# Patient Record
Sex: Female | Born: 1950
Health system: Southern US, Community
[De-identification: ages and names within clinical notes are randomized; demographics above are authoritative.]

## PROBLEM LIST (undated history)

## (undated) DIAGNOSIS — M25579 Pain in unspecified ankle and joints of unspecified foot: Secondary | ICD-10-CM

## (undated) DIAGNOSIS — F419 Anxiety disorder, unspecified: Secondary | ICD-10-CM

## (undated) DIAGNOSIS — M1990 Unspecified osteoarthritis, unspecified site: Secondary | ICD-10-CM

## (undated) DIAGNOSIS — M545 Low back pain, unspecified: Secondary | ICD-10-CM

## (undated) DIAGNOSIS — M069 Rheumatoid arthritis, unspecified: Secondary | ICD-10-CM

## (undated) DIAGNOSIS — K219 Gastro-esophageal reflux disease without esophagitis: Secondary | ICD-10-CM

## (undated) DIAGNOSIS — J45909 Unspecified asthma, uncomplicated: Secondary | ICD-10-CM

## (undated) DIAGNOSIS — E669 Obesity, unspecified: Secondary | ICD-10-CM

## (undated) DIAGNOSIS — G629 Polyneuropathy, unspecified: Secondary | ICD-10-CM

## (undated) DIAGNOSIS — M549 Dorsalgia, unspecified: Secondary | ICD-10-CM

## (undated) DIAGNOSIS — R609 Edema, unspecified: Secondary | ICD-10-CM

## (undated) DIAGNOSIS — H269 Unspecified cataract: Secondary | ICD-10-CM

## (undated) DIAGNOSIS — K5730 Diverticulosis of large intestine without perforation or abscess without bleeding: Secondary | ICD-10-CM

## (undated) DIAGNOSIS — K5792 Diverticulitis of intestine, part unspecified, without perforation or abscess without bleeding: Secondary | ICD-10-CM

## (undated) DIAGNOSIS — M2550 Pain in unspecified joint: Secondary | ICD-10-CM

## (undated) DIAGNOSIS — D869 Sarcoidosis, unspecified: Secondary | ICD-10-CM

## (undated) DIAGNOSIS — H409 Unspecified glaucoma: Secondary | ICD-10-CM

## (undated) DIAGNOSIS — R0789 Other chest pain: Secondary | ICD-10-CM

## (undated) DIAGNOSIS — M76829 Posterior tibial tendinitis, unspecified leg: Secondary | ICD-10-CM

## (undated) DIAGNOSIS — T7840XA Allergy, unspecified, initial encounter: Secondary | ICD-10-CM

## (undated) DIAGNOSIS — D649 Anemia, unspecified: Secondary | ICD-10-CM

## (undated) DIAGNOSIS — J209 Acute bronchitis, unspecified: Secondary | ICD-10-CM

## (undated) DIAGNOSIS — F329 Major depressive disorder, single episode, unspecified: Secondary | ICD-10-CM

## (undated) DIAGNOSIS — E739 Lactose intolerance, unspecified: Secondary | ICD-10-CM

## (undated) DIAGNOSIS — F32A Depression, unspecified: Secondary | ICD-10-CM

## (undated) DIAGNOSIS — R0602 Shortness of breath: Secondary | ICD-10-CM

## (undated) HISTORY — DX: Low back pain, unspecified: M54.50

## (undated) HISTORY — DX: Anxiety disorder, unspecified: F41.9

## (undated) HISTORY — DX: Sarcoidosis, unspecified: D86.9

## (undated) HISTORY — PX: EYE SURGERY: SHX253

## (undated) HISTORY — DX: Allergy, unspecified, initial encounter: T78.40XA

## (undated) HISTORY — DX: Edema, unspecified: R60.9

## (undated) HISTORY — DX: Anemia, unspecified: D64.9

## (undated) HISTORY — DX: Low back pain: M54.5

## (undated) HISTORY — DX: Other chest pain: R07.89

## (undated) HISTORY — DX: Polyneuropathy, unspecified: G62.9

## (undated) HISTORY — DX: Shortness of breath: R06.02

## (undated) HISTORY — DX: Lactose intolerance, unspecified: E73.9

## (undated) HISTORY — PX: KNEE ARTHROSCOPY: SUR90

## (undated) HISTORY — DX: Diverticulitis of intestine, part unspecified, without perforation or abscess without bleeding: K57.92

## (undated) HISTORY — DX: Posterior tibial tendinitis, unspecified leg: M76.829

## (undated) HISTORY — DX: Dorsalgia, unspecified: M54.9

## (undated) HISTORY — DX: Unspecified asthma, uncomplicated: J45.909

## (undated) HISTORY — DX: Unspecified osteoarthritis, unspecified site: M19.90

## (undated) HISTORY — DX: Acute bronchitis, unspecified: J20.9

## (undated) HISTORY — DX: Unspecified cataract: H26.9

## (undated) HISTORY — DX: Rheumatoid arthritis, unspecified: M06.9

## (undated) HISTORY — DX: Obesity, unspecified: E66.9

## (undated) HISTORY — DX: Diverticulosis of large intestine without perforation or abscess without bleeding: K57.30

## (undated) HISTORY — DX: Pain in unspecified joint: M25.50

## (undated) HISTORY — PX: COLONOSCOPY: SHX174

## (undated) HISTORY — DX: Pain in unspecified ankle and joints of unspecified foot: M25.579

## (undated) HISTORY — DX: Depression, unspecified: F32.A

## (undated) HISTORY — PX: ABDOMINAL HYSTERECTOMY: SHX81

## (undated) HISTORY — DX: Major depressive disorder, single episode, unspecified: F32.9

## (undated) HISTORY — PX: HAND SURGERY: SHX662

---

## 1998-11-26 ENCOUNTER — Encounter: Payer: Self-pay | Admitting: Obstetrics and Gynecology

## 1998-11-26 ENCOUNTER — Ambulatory Visit (HOSPITAL_COMMUNITY): Admission: RE | Admit: 1998-11-26 | Discharge: 1998-11-26 | Payer: Self-pay | Admitting: Obstetrics and Gynecology

## 1998-12-03 ENCOUNTER — Ambulatory Visit (HOSPITAL_COMMUNITY): Admission: RE | Admit: 1998-12-03 | Discharge: 1998-12-03 | Payer: Self-pay | Admitting: Obstetrics and Gynecology

## 1998-12-03 ENCOUNTER — Encounter: Payer: Self-pay | Admitting: Obstetrics and Gynecology

## 2000-02-28 ENCOUNTER — Encounter: Payer: Self-pay | Admitting: Obstetrics and Gynecology

## 2000-02-28 ENCOUNTER — Ambulatory Visit (HOSPITAL_COMMUNITY): Admission: RE | Admit: 2000-02-28 | Discharge: 2000-02-28 | Payer: Self-pay | Admitting: Obstetrics and Gynecology

## 2000-03-01 ENCOUNTER — Other Ambulatory Visit: Admission: RE | Admit: 2000-03-01 | Discharge: 2000-03-01 | Payer: Self-pay | Admitting: Obstetrics and Gynecology

## 2000-12-22 ENCOUNTER — Encounter: Payer: Self-pay | Admitting: Orthopedic Surgery

## 2000-12-22 ENCOUNTER — Ambulatory Visit (HOSPITAL_COMMUNITY): Admission: RE | Admit: 2000-12-22 | Discharge: 2000-12-22 | Payer: Self-pay | Admitting: Orthopedic Surgery

## 2001-03-18 ENCOUNTER — Other Ambulatory Visit: Admission: RE | Admit: 2001-03-18 | Discharge: 2001-03-18 | Payer: Self-pay | Admitting: Obstetrics and Gynecology

## 2001-03-19 ENCOUNTER — Encounter: Payer: Self-pay | Admitting: Obstetrics and Gynecology

## 2001-03-19 ENCOUNTER — Ambulatory Visit (HOSPITAL_COMMUNITY): Admission: RE | Admit: 2001-03-19 | Discharge: 2001-03-19 | Payer: Self-pay | Admitting: Obstetrics and Gynecology

## 2001-07-10 ENCOUNTER — Encounter: Admission: RE | Admit: 2001-07-10 | Discharge: 2001-07-10 | Payer: Self-pay | Admitting: Internal Medicine

## 2001-07-10 ENCOUNTER — Encounter: Payer: Self-pay | Admitting: Internal Medicine

## 2002-07-16 ENCOUNTER — Ambulatory Visit (HOSPITAL_COMMUNITY): Admission: RE | Admit: 2002-07-16 | Discharge: 2002-07-16 | Payer: Self-pay | Admitting: Obstetrics and Gynecology

## 2002-07-16 ENCOUNTER — Encounter: Payer: Self-pay | Admitting: Obstetrics and Gynecology

## 2004-08-17 ENCOUNTER — Ambulatory Visit: Payer: Self-pay | Admitting: Internal Medicine

## 2005-01-19 ENCOUNTER — Ambulatory Visit: Payer: Self-pay | Admitting: Internal Medicine

## 2005-01-26 ENCOUNTER — Ambulatory Visit: Payer: Self-pay | Admitting: Internal Medicine

## 2005-03-15 ENCOUNTER — Ambulatory Visit (HOSPITAL_COMMUNITY): Admission: RE | Admit: 2005-03-15 | Discharge: 2005-03-15 | Payer: Self-pay | Admitting: Obstetrics and Gynecology

## 2005-06-22 ENCOUNTER — Ambulatory Visit: Payer: Self-pay | Admitting: Internal Medicine

## 2005-12-15 ENCOUNTER — Other Ambulatory Visit: Admission: RE | Admit: 2005-12-15 | Discharge: 2005-12-15 | Payer: Self-pay | Admitting: Obstetrics and Gynecology

## 2006-01-22 ENCOUNTER — Ambulatory Visit: Payer: Self-pay | Admitting: Internal Medicine

## 2006-04-12 ENCOUNTER — Ambulatory Visit: Payer: Self-pay | Admitting: Internal Medicine

## 2006-06-06 ENCOUNTER — Ambulatory Visit: Payer: Self-pay | Admitting: Internal Medicine

## 2006-08-22 ENCOUNTER — Ambulatory Visit: Payer: Self-pay | Admitting: Internal Medicine

## 2006-09-06 ENCOUNTER — Ambulatory Visit: Payer: Self-pay | Admitting: Internal Medicine

## 2007-07-11 ENCOUNTER — Ambulatory Visit: Payer: Self-pay | Admitting: Internal Medicine

## 2007-07-11 DIAGNOSIS — M549 Dorsalgia, unspecified: Secondary | ICD-10-CM | POA: Insufficient documentation

## 2007-07-11 DIAGNOSIS — K5730 Diverticulosis of large intestine without perforation or abscess without bleeding: Secondary | ICD-10-CM | POA: Insufficient documentation

## 2007-07-12 ENCOUNTER — Ambulatory Visit (HOSPITAL_COMMUNITY): Admission: RE | Admit: 2007-07-12 | Discharge: 2007-07-12 | Payer: Self-pay | Admitting: Obstetrics and Gynecology

## 2007-07-24 ENCOUNTER — Ambulatory Visit: Payer: Self-pay | Admitting: Gastroenterology

## 2007-08-07 ENCOUNTER — Encounter: Payer: Self-pay | Admitting: Internal Medicine

## 2007-08-07 ENCOUNTER — Ambulatory Visit: Payer: Self-pay | Admitting: Gastroenterology

## 2007-08-13 ENCOUNTER — Encounter: Payer: Self-pay | Admitting: Internal Medicine

## 2007-08-22 ENCOUNTER — Ambulatory Visit: Payer: Self-pay | Admitting: Internal Medicine

## 2007-08-22 LAB — CONVERTED CEMR LAB
AST: 22 units/L (ref 0–37)
Alkaline Phosphatase: 56 units/L (ref 39–117)
BUN: 10 mg/dL (ref 6–23)
CO2: 30 meq/L (ref 19–32)
Eosinophils Absolute: 0.2 10*3/uL (ref 0.0–0.6)
Eosinophils Relative: 3.8 % (ref 0.0–5.0)
Glucose, Bld: 106 mg/dL — ABNORMAL HIGH (ref 70–99)
HCT: 36.5 % (ref 36.0–46.0)
Hemoglobin: 12.5 g/dL (ref 12.0–15.0)
MCHC: 34.2 g/dL (ref 30.0–36.0)
Monocytes Relative: 8.8 % (ref 3.0–11.0)
Neutro Abs: 2.5 10*3/uL (ref 1.4–7.7)
Neutrophils Relative %: 41.5 % — ABNORMAL LOW (ref 43.0–77.0)
Platelets: 304 10*3/uL (ref 150–400)
RDW: 13.3 % (ref 11.5–14.6)
Sodium: 143 meq/L (ref 135–145)
TSH: 0.84 microintl units/mL (ref 0.35–5.50)
Total Protein: 6.4 g/dL (ref 6.0–8.3)

## 2007-09-02 ENCOUNTER — Ambulatory Visit: Payer: Self-pay | Admitting: Internal Medicine

## 2007-09-02 DIAGNOSIS — D869 Sarcoidosis, unspecified: Secondary | ICD-10-CM | POA: Insufficient documentation

## 2007-09-02 DIAGNOSIS — J45909 Unspecified asthma, uncomplicated: Secondary | ICD-10-CM | POA: Insufficient documentation

## 2007-09-23 ENCOUNTER — Ambulatory Visit: Payer: Self-pay | Admitting: Internal Medicine

## 2007-09-23 DIAGNOSIS — J209 Acute bronchitis, unspecified: Secondary | ICD-10-CM | POA: Insufficient documentation

## 2007-10-11 ENCOUNTER — Telehealth: Payer: Self-pay | Admitting: Internal Medicine

## 2007-10-11 ENCOUNTER — Ambulatory Visit: Payer: Self-pay | Admitting: Internal Medicine

## 2008-03-26 ENCOUNTER — Telehealth: Payer: Self-pay | Admitting: Internal Medicine

## 2008-04-01 ENCOUNTER — Ambulatory Visit: Payer: Self-pay | Admitting: Internal Medicine

## 2008-04-01 DIAGNOSIS — M25579 Pain in unspecified ankle and joints of unspecified foot: Secondary | ICD-10-CM | POA: Insufficient documentation

## 2008-04-01 DIAGNOSIS — M545 Low back pain, unspecified: Secondary | ICD-10-CM | POA: Insufficient documentation

## 2008-04-13 ENCOUNTER — Ambulatory Visit: Payer: Self-pay | Admitting: Internal Medicine

## 2008-07-22 ENCOUNTER — Ambulatory Visit: Payer: Self-pay | Admitting: Internal Medicine

## 2008-07-22 DIAGNOSIS — R609 Edema, unspecified: Secondary | ICD-10-CM | POA: Insufficient documentation

## 2008-10-21 ENCOUNTER — Ambulatory Visit: Payer: Self-pay | Admitting: Internal Medicine

## 2008-10-21 DIAGNOSIS — R0789 Other chest pain: Secondary | ICD-10-CM | POA: Insufficient documentation

## 2009-03-08 ENCOUNTER — Telehealth: Payer: Self-pay | Admitting: Internal Medicine

## 2009-07-14 ENCOUNTER — Ambulatory Visit: Payer: Self-pay | Admitting: Internal Medicine

## 2009-08-17 ENCOUNTER — Ambulatory Visit: Payer: Self-pay | Admitting: Internal Medicine

## 2009-08-25 ENCOUNTER — Ambulatory Visit: Payer: Self-pay | Admitting: Family Medicine

## 2009-08-25 DIAGNOSIS — M76829 Posterior tibial tendinitis, unspecified leg: Secondary | ICD-10-CM | POA: Insufficient documentation

## 2009-11-17 ENCOUNTER — Ambulatory Visit: Payer: Self-pay | Admitting: Internal Medicine

## 2009-11-18 ENCOUNTER — Encounter: Payer: Self-pay | Admitting: Internal Medicine

## 2009-11-19 ENCOUNTER — Encounter: Payer: Self-pay | Admitting: Internal Medicine

## 2010-01-11 ENCOUNTER — Encounter: Payer: Self-pay | Admitting: Internal Medicine

## 2010-06-29 ENCOUNTER — Ambulatory Visit: Payer: Self-pay | Admitting: Internal Medicine

## 2010-11-01 NOTE — Medication Information (Signed)
Summary: Coverage Approval for Fexofenadine  Coverage Approval for Fexofenadine   Imported By: Maryln Gottron 01/12/2010 13:19:09  _____________________________________________________________________  External Attachment:    Type:   Image     Comment:   External Document

## 2010-11-01 NOTE — Miscellaneous (Signed)
Summary: omeprazole added  Medications Added OMEPRAZOLE 20 MG CPDR (OMEPRAZOLE) qd       Clinical Lists Changes  Medications: Added new medication of OMEPRAZOLE 20 MG CPDR (OMEPRAZOLE) qd - Signed Rx of OMEPRAZOLE 20 MG CPDR (OMEPRAZOLE) qd;  #90 x 6;  Signed;  Entered by: Duard Brady LPN;  Authorized by: Gordy Savers  MD;  Method used: Electronically to [facility] [address], [place], [place]  [address], Ph: [phone], Fax: [phone]    Prescriptions: OMEPRAZOLE 20 MG CPDR (OMEPRAZOLE) qd  #90 x 6   Entered by:   Duard Brady LPN   Authorized by:   Gordy Savers  MD   Signed by:   Duard Brady LPN on 84/13/2440   Method used:   Electronically to        [facility]* (retail)       [address]       [place], [place]  [address]       Ph: [phone]       Fax: [phone]   RxID:   [id number]

## 2010-11-01 NOTE — Medication Information (Signed)
Summary: Prior Authorization Request for Aciphex  Prior Authorization Request for Aciphex   Imported By: Maryln Gottron 11/24/2009 15:46:00  _____________________________________________________________________  External Attachment:    Type:   Image     Comment:   External Document

## 2010-11-01 NOTE — Assessment & Plan Note (Signed)
Summary: FLU SHOT/NJR   Nurse Visit  CC: Flu shot./kb   Allergies: 1)  ! Motrin Ib (Ibuprofen)  Orders Added: 1)  Admin 1st Vaccine [90471] 2)  Flu Vaccine 57yrs + [[address]]              Flu Vaccine Consent Questions     Do you have a history of severe allergic reactions to this vaccine? no    Any prior history of allergic reactions to egg and/or gelatin? no    Do you have a sensitivity to the preservative Thimersol? no    Do you have a past history of Guillan-Barre Syndrome? no    Do you currently have an acute febrile illness? no    Have you ever had a severe reaction to latex? no    Vaccine information given and explained to patient? yes    Are you currently pregnant? no    Lot Number:[id number]   Exp Date:04/01/2011   Site Given  Left Deltoid IMu

## 2010-11-01 NOTE — Assessment & Plan Note (Signed)
Summary: fu on ankle/njr   Vital Signs:  Patient profile:   60 year old female Weight:      228 pounds Temp:     98.6 degrees F BP sitting:   120 / 74  (right arm) Cuff size:   large  Vitals Entered By: Duard Brady LPN (November 17, 2009 8:59 AM) CC: c/o left ankle pain still , from dec.    CC:  c/o left ankle pain still  and from dec. Marland Kitchen  History of Present Illness: 60 year old patient who took a misstep over two months ago experiencing pain involving the left medial ankle.  This has not improved with conservative treatment.  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  She has a history of asthma, which has been stable for the winter.  Allergies: 1)  ! Motrin Ib (Ibuprofen)  Past History:  Past Medical History: Reviewed history from 04/01/2008 and no changes required. sarcoidosis Diverticulosis, colon Asthma Low back pain obesity  Review of Systems       The patient complains of difficulty walking.  The patient denies anorexia, fever, weight loss, weight gain, vision loss, decreased hearing, hoarseness, chest pain, syncope, dyspnea on exertion, peripheral edema, prolonged cough, headaches, hemoptysis, abdominal pain, melena, hematochezia, severe indigestion/heartburn, hematuria, incontinence, genital sores, muscle weakness, suspicious skin lesions, transient blindness, depression, unusual weight change, abnormal bleeding, enlarged lymph nodes, angioedema, and breast masses.    Physical Exam  General:  overweight-appearing.  normal blood pressureoverweight-appearing.   Lungs:  clear Msk:  tenderness along the left medial malleleous;  mild soft tissue swelling of the ankle medially   Impression & Recommendations:  Problem # 1:  TIBIALIS TENDINITIS (ICD-726.72)  Orders: Orthopedic Referral (Ortho)  Problem # 2:  PEDAL EDEMA (ICD-782.3)  Her updated medication list for this problem includes:    Furosemide 40 Mg Tabs  (Furosemide) ..... One daily as needed for swelling  Her updated medication list for this problem includes:    Furosemide 40 Mg Tabs (Furosemide) ..... One daily as needed for swelling  Complete Medication List: 1)  Advair Diskus 250-50 Mcg/dose Misc (Fluticasone-salmeterol) .... One puff q12h.rinse mouth after each use 2)  Aciphex 20 Mg Tbec (Rabeprazole sodium) .Marland Kitchen.. 1 once daily as needed 3)  Albuterol 90 Mcg/act Aers (Albuterol) .... Use as needed 4)  Estrace 1 Mg Tabs (Estradiol) .Marland Kitchen.. 1 once daily 5)  Allegra-d 12 Hour 60-120 Mg Tb12 (Fexofenadine-pseudoephedrine) .Marland Kitchen.. 1 once daily as needed 6)  Buspar 15 Mg Tabs (Buspirone hcl) .... One tablet  two times a day 7)  Diclofenac Sodium 75 Mg Tbec (Diclofenac sodium) .... One twice daily 8)  Skelaxin 800 Mg Tabs (Metaxalone) .... One three times daily as needed 9)  Proair Hfa 108 (90 Base) Mcg/act Aers (Albuterol sulfate) .... 2 puffs every 6 hrs as needed 10)  Tramadol Hcl 50 Mg Tabs (Tramadol hcl) .... One every 6 hours for pain 11)  Furosemide 40 Mg Tabs (Furosemide) .... One daily as needed for swelling  Patient Instructions: 1)  Limit your Sodium (Salt). 2)  It is important that you exercise regularly at least 20 minutes 5 times a week. If you develop chest pain, have severe difficulty breathing, or feel very tired , stop exercising immediately and seek medical attention. 3)  You need to lose weight. Consider a lower calorie diet and regular exercise.  Prescriptions: FUROSEMIDE 40 MG TABS (FUROSEMIDE) one daily as needed for swelling  #90 x 6  Entered and Authorized by:   Gordy Savers  MD   Signed by:   Gordy Savers  MD on 11/17/2009   Method used:   Print then Give to Patient   RxID:   [id number] TRAMADOL HCL 50 MG  TABS (TRAMADOL HCL) one every 6 hours for pain  #50 x 3   Entered and Authorized by:   Gordy Savers  MD   Signed by:   Gordy Savers  MD on 11/17/2009   Method used:   Print then Give  to Patient   RxID:   [id number] PROAIR HFA 108 (90 BASE) MCG/ACT  AERS (ALBUTEROL SULFATE) 2 puffs every 6 hrs as needed  #3 x 6   Entered and Authorized by:   Gordy Savers  MD   Signed by:   Gordy Savers  MD on 11/17/2009   Method used:   Print then Give to Patient   RxID:   [id number] DICLOFENAC SODIUM 75 MG  TBEC (DICLOFENAC SODIUM) one twice daily  #180 x 3   Entered and Authorized by:   Gordy Savers  MD   Signed by:   Gordy Savers  MD on 11/17/2009   Method used:   Print then Give to Patient   RxID:   [id number] BUSPAR 15 MG  TABS (BUSPIRONE HCL) one tablet  two times a day  #180 x 6   Entered and Authorized by:   Gordy Savers  MD   Signed by:   Gordy Savers  MD on 11/17/2009   Method used:   Print then Give to Patient   RxID:   [id number] ALLEGRA-D 12 HOUR 60-120 MG  TB12 (FEXOFENADINE-PSEUDOEPHEDRINE) 1 once daily as needed  #180 x 6   Entered and Authorized by:   Gordy Savers  MD   Signed by:   Gordy Savers  MD on 11/17/2009   Method used:   Print then Give to Patient   RxID:   [id number] ESTRACE 1 MG  TABS (ESTRADIOL) 1 once daily  #90 x 2   Entered and Authorized by:   Gordy Savers  MD   Signed by:   Gordy Savers  MD on 11/17/2009   Method used:   Print then Give to Patient   RxID:   [id number] ACIPHEX 20 MG  TBEC (RABEPRAZOLE SODIUM) 1 once daily as needed  #90 x 6   Entered and Authorized by:   Gordy Savers  MD   Signed by:   Gordy Savers  MD on 11/17/2009   Method used:   Print then Give to Patient   RxID:   [id number] PROAIR HFA 108 (90 BASE) MCG/ACT  AERS (ALBUTEROL SULFATE) 2 puffs every 6 hrs as needed  #3 x 6   Entered and Authorized by:   Gordy Savers  MD   Signed by:   Gordy Savers  MD on 11/17/2009   Method used:   Electronically to        [facility]* (retail)       [address]       [place], [place]  [address]        Ph: [phone]       Fax: [phone]   RxID:   [id number] DICLOFENAC SODIUM 75 MG  TBEC (DICLOFENAC SODIUM) one twice daily  #180 x 3   Entered and Authorized by:   Gordy Savers  MD   Signed by:  Gordy Savers  MD on 11/17/2009   Method used:   Electronically to        [facility]* (retail)       [address]       [place], [place]  [address]       Ph: [phone]       Fax: [phone]   RxID:   [id number] BUSPAR 15 MG  TABS (BUSPIRONE HCL) one tablet  two times a day  #180 x 6   Entered and Authorized by:   Gordy Savers  MD   Signed by:   Gordy Savers  MD on 11/17/2009   Method used:   Electronically to        [facility]* (retail)       [address]       [place], [place]  [address]       Ph: [phone]       Fax: [phone]   RxID:   [id number] ALLEGRA-D 12 HOUR 60-120 MG  TB12 (FEXOFENADINE-PSEUDOEPHEDRINE) 1 once daily as needed  #180 x 6   Entered and Authorized by:   Gordy Savers  MD   Signed by:   Gordy Savers  MD on 11/17/2009   Method used:   Electronically to        [facility]* (retail)       [address]       [place], [place]  [address]       Ph: [phone]       Fax: [phone]   RxID:   [id number] ESTRACE 1 MG  TABS (ESTRADIOL) 1 once daily  #90 x 2   Entered and Authorized by:   Gordy Savers  MD   Signed by:   Gordy Savers  MD on 11/17/2009   Method used:   Electronically to        [facility]* (retail)       [address]       [place], [place]  [address]       Ph: [phone]       Fax: [phone]   RxID:   [id number] ACIPHEX 20 MG  TBEC (RABEPRAZOLE SODIUM) 1 once daily as needed  #90 x 6   Entered and Authorized by:   Gordy Savers  MD   Signed by:   Gordy Savers  MD on 11/17/2009   Method used:   Electronically to        [facility]* (retail)       [address]       [place], [place]  [address]       Ph: [phone]        Fax: [phone]   RxID:   [id number] ADVAIR DISKUS 250-50 MCG/DOSE  MISC (FLUTICASONE-SALMETEROL) one puff q12h.rinse mouth after each use  #one x 6   Entered and Authorized by:   Gordy Savers  MD   Signed by:   Gordy Savers  MD on 11/17/2009   Method used:   Print then Give to Patient   RxID:   [id number]

## 2010-11-07 ENCOUNTER — Encounter: Payer: Self-pay | Admitting: Internal Medicine

## 2010-11-07 ENCOUNTER — Ambulatory Visit (INDEPENDENT_AMBULATORY_CARE_PROVIDER_SITE_OTHER): Payer: BC Managed Care – PPO | Admitting: Internal Medicine

## 2010-11-07 VITALS — BP 122/80 | Temp 98.2°F | Ht 60.0 in | Wt 223.0 lb

## 2010-11-07 DIAGNOSIS — H6090 Unspecified otitis externa, unspecified ear: Secondary | ICD-10-CM

## 2010-11-07 DIAGNOSIS — H60399 Other infective otitis externa, unspecified ear: Secondary | ICD-10-CM

## 2010-11-07 MED ORDER — NEOMYCIN-POLYMYXIN-HC 3.5-10000-1 OT SOLN: 3.0000 [drp] | Freq: Three times a day (TID) | OTIC | Status: AC

## 2010-11-07 NOTE — Patient Instructions (Signed)
Call or return to clinic prn if these symptoms worsen or fail to improve as anticipated.

## 2010-11-07 NOTE — Progress Notes (Signed)
  Subjective:    Patient ID: [name], female    DOB: 28-Feb-1951, 60 y.o.   MRN: [id number]  Ear Drainage  There is pain in the left ear. This is a new problem. The current episode started in the past 7 days. The problem occurs every few hours. The problem has been unchanged. There has been no fever. The pain is at a severity of 2/10. The pain is mild. Associated symptoms include coughing and ear discharge. Pertinent negatives include no abdominal pain, diarrhea, headaches, hearing loss, rash, rhinorrhea, sore throat or vomiting. She has tried NSAIDs for the symptoms. The treatment provided no relief.  Asthma She complains of cough. There is no shortness of breath. Associated symptoms include ear pain. Pertinent negatives include no chest pain, headaches, rhinorrhea or sore throat. Her past medical history is significant for asthma.    60 year old patient who has a history of asthma.  For the past, week she's had pain and drainage from left ear.  There is been no hearing loss, ringing in the ears.  Denies any fever.  Her asthma has been stable.  She remains on maintenance and there and also has p.r.n. Albuterol use.    Review of Systems  Constitutional: Negative.   HENT: Positive for ear pain and ear discharge. Negative for hearing loss, congestion, sore throat, rhinorrhea, dental problem, sinus pressure and tinnitus.   Eyes: Negative for pain, discharge and visual disturbance.  Respiratory: Positive for cough. Negative for shortness of breath.   Cardiovascular: Negative for chest pain, palpitations and leg swelling.  Gastrointestinal: Negative for nausea, vomiting, abdominal pain, diarrhea, constipation, blood in stool and abdominal distention.  Genitourinary: Negative for dysuria, urgency, frequency, hematuria, flank pain, vaginal bleeding, vaginal discharge, difficulty urinating, vaginal pain and pelvic pain.  Musculoskeletal: Negative for joint swelling, arthralgias and gait problem.    Skin: Negative for rash.  Neurological: Negative for dizziness, syncope, speech difficulty, weakness, numbness and headaches.  Hematological: Negative for adenopathy. Does not bruise/bleed easily.  Psychiatric/Behavioral: Negative for behavioral problems, dysphoric mood and agitation. The patient is not nervous/anxious.        Objective:   Physical Exam  Constitutional: She is oriented to person, place, and time. She appears well-developed and well-nourished.  HENT:  Head: Normocephalic.  Right Ear: External ear normal.  Left Ear: External ear normal.  Mouth/Throat: Oropharynx is clear and moist.       Some dry crusted exudates present involving floor of the left canal.  The canal is slightly erythematous  Eyes: Conjunctivae and EOM are normal. Pupils are equal, round, and reactive to light.  Neck: Normal range of motion. Neck supple. No thyromegaly present.  Cardiovascular: Normal rate, regular rhythm, normal heart sounds and intact distal pulses.   Pulmonary/Chest: Effort normal and breath sounds normal.  Abdominal: Soft. Bowel sounds are normal. She exhibits no mass. There is no tenderness.  Musculoskeletal: Normal range of motion.  Lymphadenopathy:    She has no cervical adenopathy.  Neurological: She is alert and oriented to person, place, and time.  Skin: Skin is warm and dry. No rash noted.  Psychiatric: She has a normal mood and affect. Her behavior is normal.          Assessment & Plan:  Otitis externa-  Will treat  with otic drops Asthma-stable;  Will continue present maintenance medication

## 2011-01-10 ENCOUNTER — Other Ambulatory Visit: Payer: Self-pay

## 2011-01-10 MED ORDER — BUSPIRONE HCL 15 MG PO TABS: 15.0000 mg | ORAL_TABLET | Freq: Two times a day (BID) | ORAL | Status: DC

## 2011-01-10 MED ORDER — ESTRADIOL 1 MG PO TABS: 1.0000 mg | ORAL_TABLET | Freq: Every day | ORAL | Status: DC

## 2011-01-10 MED ORDER — FLUTICASONE-SALMETEROL 250-50 MCG/DOSE IN AEPB: 1.0000 | INHALATION_SPRAY | Freq: Two times a day (BID) | RESPIRATORY_TRACT | Status: DC

## 2011-01-10 NOTE — Telephone Encounter (Signed)
Faxed back to [facility] pharmacy. KIK

## 2011-02-17 NOTE — Assessment & Plan Note (Signed)
[facility]                            [facility] OFFICE NOTE   LILLIS, NUTTLE                     MRN:          [id number]  DATE:09/06/2006                            DOB:          11/12/1950    A 60 year old female seen today for a wellness exam.  She has as history  of asthma which has been quite stable.  Also, a history of sarcoid.  One  of the complaints today include some left lower back pain that has been  problematic for several months,  this does benefit from Celebrex.  She  has a history, also, of extensive diverticulosis, and has intermittent  left lower quadrant pain.  She has had hand surgery in the past,  hysterectomy in 1977.   FAMILY HISTORY:  Mother resides in a nursing home, with a history of  diabetes.  Father died at 48 of an MI and also had diabetes.  One  brother, possible prostate cancer.  One sister with sarcoid.  Maternal  aunt had uterine cancer.   PHYSICAL EXAMINATION:  Exam revealed an overweight, black female, no  acute distress.  Blood pressure 130/78.  SKIN:  Negative.  Fundi, ears, nose, and throat clear.  NECK:  No bruits or adenopathy.  CHEST:  Clear.  CARDIOVASCULAR: Normal heart sounds, no murmurs.  BREASTS:  Negative.  ABDOMEN:  Lower midline scars.  No significant tenderness.  EXTREMITIES: Negative with full peripheral pulses.  Straight leg testing  was normal.  Neurovascular structures intact.   IMPRESSION:  Asthma, history of sarcoid, diverticulosis, low back pain,  obesity.   DISPOSITION:  We will set up for a full colonoscopy.  Medical regimen is  unchanged.  Weight loss, exercise, all encouraged.  Reassess in 6  months.     Gordy Savers, MD  Electronically Signed    PFK/MedQ  DD: 09/06/2006  DT: 09/06/2006  Job #: [id number]

## 2011-05-16 ENCOUNTER — Other Ambulatory Visit: Payer: Self-pay

## 2011-05-16 MED ORDER — METAXALONE 800 MG PO TABS: 800.0000 mg | ORAL_TABLET | Freq: Three times a day (TID) | ORAL | Status: AC

## 2011-05-16 NOTE — Telephone Encounter (Signed)
Fax request from [facility] for skelaxin 800 tid prn - last rx'd in 2008 ,last seen 11/2010 Please advise

## 2011-05-16 NOTE — Telephone Encounter (Signed)
#  50  RF 2 

## 2011-05-16 NOTE — Telephone Encounter (Signed)
Faxed back to [facility] pharmacy

## 2011-07-17 ENCOUNTER — Ambulatory Visit (INDEPENDENT_AMBULATORY_CARE_PROVIDER_SITE_OTHER): Payer: BC Managed Care – PPO

## 2011-07-17 DIAGNOSIS — Z23 Encounter for immunization: Secondary | ICD-10-CM

## 2011-07-28 ENCOUNTER — Other Ambulatory Visit (HOSPITAL_COMMUNITY): Payer: Self-pay | Admitting: Obstetrics and Gynecology

## 2011-07-28 ENCOUNTER — Other Ambulatory Visit: Payer: Self-pay | Admitting: Internal Medicine

## 2011-07-28 DIAGNOSIS — Z1231 Encounter for screening mammogram for malignant neoplasm of breast: Secondary | ICD-10-CM

## 2011-08-23 ENCOUNTER — Ambulatory Visit (HOSPITAL_COMMUNITY)
Admission: RE | Admit: 2011-08-23 | Discharge: 2011-08-23 | Disposition: A | Discharge status: Home / Self Care | Payer: BC Managed Care – PPO | Source: Ambulatory Visit | Attending: Obstetrics and Gynecology | Admitting: Obstetrics and Gynecology

## 2011-08-23 DIAGNOSIS — Z1231 Encounter for screening mammogram for malignant neoplasm of breast: Secondary | ICD-10-CM | POA: Insufficient documentation

## 2011-08-28 ENCOUNTER — Other Ambulatory Visit: Payer: Self-pay | Admitting: Obstetrics and Gynecology

## 2011-08-28 DIAGNOSIS — R928 Other abnormal and inconclusive findings on diagnostic imaging of breast: Secondary | ICD-10-CM

## 2011-09-14 ENCOUNTER — Ambulatory Visit
Admission: RE | Admit: 2011-09-14 | Discharge: 2011-09-14 | Disposition: A | Discharge status: Home / Self Care | Payer: BC Managed Care – PPO | Source: Ambulatory Visit | Attending: Obstetrics and Gynecology | Admitting: Obstetrics and Gynecology

## 2011-09-14 DIAGNOSIS — R928 Other abnormal and inconclusive findings on diagnostic imaging of breast: Secondary | ICD-10-CM

## 2012-03-06 ENCOUNTER — Other Ambulatory Visit: Payer: Self-pay | Admitting: Obstetrics and Gynecology

## 2012-03-06 DIAGNOSIS — R921 Mammographic calcification found on diagnostic imaging of breast: Secondary | ICD-10-CM

## 2012-03-07 ENCOUNTER — Telehealth: Payer: Self-pay | Admitting: Internal Medicine

## 2012-03-07 DIAGNOSIS — M549 Dorsalgia, unspecified: Secondary | ICD-10-CM

## 2012-03-07 DIAGNOSIS — G8929 Other chronic pain: Secondary | ICD-10-CM

## 2012-03-07 DIAGNOSIS — D869 Sarcoidosis, unspecified: Secondary | ICD-10-CM

## 2012-03-07 NOTE — Telephone Encounter (Signed)
Spoke with pt- informed dr. Amador Cunas out of office until Monday - will address then

## 2012-03-07 NOTE — Telephone Encounter (Signed)
Pt called and said that she is having a mammogram done on 03/14/12 at [facility], pt would also like to have a bone density ordered to be done on same day.

## 2012-03-11 NOTE — Telephone Encounter (Signed)
I done see one - order place - pt aware ok to have . KIK

## 2012-03-11 NOTE — Telephone Encounter (Signed)
Ok if not done in past 2 years

## 2012-03-14 ENCOUNTER — Encounter: Payer: Self-pay | Admitting: Internal Medicine

## 2012-03-14 ENCOUNTER — Ambulatory Visit
Admission: RE | Admit: 2012-03-14 | Discharge: 2012-03-14 | Disposition: A | Discharge status: Home / Self Care | Payer: BC Managed Care – PPO | Source: Ambulatory Visit | Attending: Obstetrics and Gynecology | Admitting: Obstetrics and Gynecology

## 2012-03-14 DIAGNOSIS — R921 Mammographic calcification found on diagnostic imaging of breast: Secondary | ICD-10-CM

## 2012-03-21 ENCOUNTER — Other Ambulatory Visit (INDEPENDENT_AMBULATORY_CARE_PROVIDER_SITE_OTHER): Payer: BC Managed Care – PPO

## 2012-03-21 DIAGNOSIS — Z0000 Encounter for general adult medical examination without abnormal findings: Secondary | ICD-10-CM

## 2012-03-21 LAB — BASIC METABOLIC PANEL
BUN: 10 mg/dL (ref 6–23)
CO2: 26 mEq/L (ref 19–32)
Glucose, Bld: 98 mg/dL (ref 70–99)
Sodium: 139 mEq/L (ref 135–145)

## 2012-03-21 LAB — CBC WITH DIFFERENTIAL/PLATELET
Basophils Relative: 0.9 % (ref 0.0–3.0)
Hemoglobin: 12.2 g/dL (ref 12.0–15.0)
Lymphocytes Relative: 36.2 % (ref 12.0–46.0)
Lymphs Abs: 2.4 10*3/uL (ref 0.7–4.0)
MCHC: 32 g/dL (ref 30.0–36.0)
Monocytes Absolute: 0.5 10*3/uL (ref 0.1–1.0)
Neutrophils Relative %: 52.7 % (ref 43.0–77.0)
Platelets: 307 10*3/uL (ref 150.0–400.0)
RBC: 4 Mil/uL (ref 3.87–5.11)
RDW: 15.1 % — ABNORMAL HIGH (ref 11.5–14.6)

## 2012-03-21 LAB — HEPATIC FUNCTION PANEL
Albumin: 3.6 g/dL (ref 3.5–5.2)
Total Protein: 6.9 g/dL (ref 6.0–8.3)

## 2012-03-21 LAB — LIPID PANEL
LDL Cholesterol: 90 mg/dL (ref 0–99)
Total CHOL/HDL Ratio: 3

## 2012-03-21 LAB — POCT URINALYSIS DIPSTICK
Blood, UA: NEGATIVE
Ketones, UA: NEGATIVE
Spec Grav, UA: 1.015
Urobilinogen, UA: 0.2

## 2012-03-21 LAB — TSH: TSH: 1.13 u[IU]/mL (ref 0.35–5.50)

## 2012-03-22 ENCOUNTER — Encounter: Payer: Self-pay | Admitting: Internal Medicine

## 2012-03-22 ENCOUNTER — Ambulatory Visit
Admission: RE | Admit: 2012-03-22 | Discharge: 2012-03-22 | Disposition: A | Discharge status: Home / Self Care | Payer: BC Managed Care – PPO | Source: Ambulatory Visit | Attending: Internal Medicine | Admitting: Internal Medicine

## 2012-03-22 DIAGNOSIS — Z780 Asymptomatic menopausal state: Secondary | ICD-10-CM

## 2012-03-22 DIAGNOSIS — G8929 Other chronic pain: Secondary | ICD-10-CM

## 2012-03-22 DIAGNOSIS — D869 Sarcoidosis, unspecified: Secondary | ICD-10-CM

## 2012-03-22 DIAGNOSIS — M549 Dorsalgia, unspecified: Secondary | ICD-10-CM

## 2012-03-22 NOTE — Progress Notes (Signed)
Quick Note:  Attempt to call - cell # - mailbox not set up for msg. , hm# - "enter access code" - was unable to leave any message - letter sent stating normal results ______

## 2012-03-26 ENCOUNTER — Encounter: Payer: Self-pay | Admitting: Internal Medicine

## 2012-03-28 ENCOUNTER — Encounter: Payer: Self-pay | Admitting: Internal Medicine

## 2012-03-28 ENCOUNTER — Ambulatory Visit (INDEPENDENT_AMBULATORY_CARE_PROVIDER_SITE_OTHER): Payer: BC Managed Care – PPO | Admitting: Internal Medicine

## 2012-03-28 VITALS — BP 126/74 | HR 119 | Temp 98.4°F | Ht 60.0 in | Wt 220.0 lb

## 2012-03-28 DIAGNOSIS — Z0000 Encounter for general adult medical examination without abnormal findings: Secondary | ICD-10-CM

## 2012-03-28 DIAGNOSIS — D869 Sarcoidosis, unspecified: Secondary | ICD-10-CM

## 2012-03-28 DIAGNOSIS — J45909 Unspecified asthma, uncomplicated: Secondary | ICD-10-CM

## 2012-03-28 MED ORDER — SERTRALINE HCL 50 MG PO TABS: 50.0000 mg | ORAL_TABLET | Freq: Every day | ORAL | Status: DC

## 2012-03-28 MED ORDER — FLUTICASONE-SALMETEROL 250-50 MCG/DOSE IN AEPB: 1.0000 | INHALATION_SPRAY | Freq: Two times a day (BID) | RESPIRATORY_TRACT | Status: DC

## 2012-03-28 MED ORDER — FUROSEMIDE 40 MG PO TABS: 40.0000 mg | ORAL_TABLET | Freq: Every day | ORAL | Status: DC

## 2012-03-28 MED ORDER — TRAMADOL HCL 50 MG PO TABS: 50.0000 mg | ORAL_TABLET | Freq: Four times a day (QID) | ORAL | Status: DC | PRN

## 2012-03-28 MED ORDER — OMEPRAZOLE 20 MG PO CPDR: 20.0000 mg | DELAYED_RELEASE_CAPSULE | Freq: Every day | ORAL | Status: DC

## 2012-03-28 NOTE — Progress Notes (Signed)
Subjective:    Patient ID: [name], female    DOB: May 23, 1951, 61 y.o.   MRN: [id number]  HPI  61 year old patient who is seen today for followup and an annual health assessment. She is scheduled for a gynecologic visit in the near future and has had a recent mammogram. Her laboratory studies were reviewed. She does have a history of sarcoidosis that has been asymptomatic. She has some situational stress related to the poor health of her husband.  Past Medical History  Diagnosis Date  . Acute bronchitis   . Pain in joint, ankle and foot   . Unspecified asthma   . Backache, unspecified   . Other chest pain   . Diverticulosis of colon (without mention of hemorrhage)   . Lumbago   . Edema   . Sarcoidosis   . Tibialis tendinitis   . Obesity     History   Social History  . Marital Status: Married    Spouse Name: N/A    Number of Children: N/A  . Years of Education: N/A   Occupational History  . Not on file.   Social History Main Topics  . Smoking status: Never Smoker   . Smokeless tobacco: Never Used  . Alcohol Use: 0.5 oz/week    1 drink(s) per week  . Drug Use: No  . Sexually Active: Not on file   Other Topics Concern  . Not on file   Social History Narrative  . No narrative on file    Past Surgical History  Procedure Date  . Hand surgery   . Abdominal hysterectomy     Family History  Problem Relation Age of Onset  . Hypertension Mother   . Stroke Mother   . Diabetes Father   . Sarcoidosis Sister   . Cancer Brother     prostate    Allergies  Allergen Reactions  . Ibuprofen     Current Outpatient Prescriptions on File Prior to Visit  Medication Sig Dispense Refill  . albuterol (PROAIR HFA) 108 (90 BASE) MCG/ACT inhaler Inhale 2 puffs into the lungs every 6 (six) hours as needed.        . brimonidine (ALPHAGAN) 0.2 % ophthalmic solution Place 1 drop into the right eye daily.        . diclofenac (VOLTAREN) 75 MG EC tablet Take 75 mg by mouth  2 (two) times daily.        . fexofenadine-pseudoephedrine (ALLEGRA-D) 60-120 MG per tablet Take 1 tablet by mouth 2 (two) times daily.        . Fluticasone-Salmeterol (ADVAIR DISKUS) 250-50 MCG/DOSE AEPB Inhale 1 puff into the lungs 2 (two) times daily.  180 each  3  . furosemide (LASIX) 40 MG tablet Take 40 mg by mouth daily. prn       . omeprazole (PRILOSEC) 20 MG capsule Take 20 mg by mouth daily.        . traMADol (ULTRAM) 50 MG tablet Take 50 mg by mouth every 6 (six) hours as needed.        . busPIRone (BUSPAR) 15 MG tablet Take 1 tablet (15 mg total) by mouth 2 (two) times daily.  180 tablet  3  . estradiol (ESTRACE) 1 MG tablet Take 1 tablet (1 mg total) by mouth daily.  90 tablet  3  . metaxalone (SKELAXIN) 800 MG tablet Take 1 tablet (800 mg total) by mouth 3 (three) times daily.  50 tablet  2  . RABEprazole (ACIPHEX) 20 MG  tablet Take 20 mg by mouth daily.          BP 126/74  Pulse 119  Temp 98.4 F (36.9 C)  Ht 5' (1.524 m)  Wt 220 lb (99.791 kg)  BMI 42.97 kg/m2  SpO2 97%      Wt Readings from Last 3 Encounters:  03/28/12 220 lb (99.791 kg)  11/07/10 223 lb (101.152 kg)  11/17/09 228 lb (103.42 kg)   Review of Systems  Constitutional: Negative for fever, appetite change, fatigue and unexpected weight change.  HENT: Negative for hearing loss, ear pain, nosebleeds, congestion, sore throat, mouth sores, trouble swallowing, neck stiffness, dental problem, voice change, sinus pressure and tinnitus.   Eyes: Negative for photophobia, pain, redness and visual disturbance.  Respiratory: Negative for cough, chest tightness and shortness of breath.   Cardiovascular: Negative for chest pain, palpitations and leg swelling.  Gastrointestinal: Negative for nausea, vomiting, abdominal pain, diarrhea, constipation, blood in stool, abdominal distention and rectal pain.  Genitourinary: Negative for dysuria, urgency, frequency, hematuria, flank pain, vaginal bleeding, vaginal  discharge, difficulty urinating, genital sores, vaginal pain, menstrual problem and pelvic pain.  Musculoskeletal: Negative for back pain and arthralgias.  Skin: Negative for rash.  Neurological: Negative for dizziness, syncope, speech difficulty, weakness, light-headedness, numbness and headaches.  Hematological: Negative for adenopathy. Does not bruise/bleed easily.  Psychiatric/Behavioral: Positive for dysphoric mood. Negative for suicidal ideas, behavioral problems, self-injury and agitation. The patient is not nervous/anxious.        Objective:   Physical Exam  Constitutional: She is oriented to person, place, and time. She appears well-developed and well-nourished.       Overweight  HENT:  Head: Normocephalic and atraumatic.  Right Ear: External ear normal.  Left Ear: External ear normal.  Mouth/Throat: Oropharynx is clear and moist.  Eyes: Conjunctivae and EOM are normal.  Neck: Normal range of motion. Neck supple. No JVD present. No thyromegaly present.  Cardiovascular: Normal rate, regular rhythm, normal heart sounds and intact distal pulses.   No murmur heard. Pulmonary/Chest: Effort normal and breath sounds normal. She has no wheezes. She has no rales.  Abdominal: Soft. Bowel sounds are normal. She exhibits no distension and no mass. There is no tenderness. There is no rebound and no guarding.  Musculoskeletal: Normal range of motion. She exhibits no edema and no tenderness.  Neurological: She is alert and oriented to person, place, and time. She has normal reflexes. No cranial nerve deficit. She exhibits normal muscle tone. Coordination normal.  Skin: Skin is warm and dry. No rash noted.  Psychiatric: She has a normal mood and affect. Her behavior is normal.          Assessment & Plan:   Preventive health examination Situational anxiety depression. We'll give a trial of Zoloft 50. Recheck in  8 weeks Sarcoidosis asymptomatic Asthma stable

## 2012-03-28 NOTE — Patient Instructions (Signed)
It is important that you exercise regularly, at least 20 minutes 3 to 4 times per week.  If you develop chest pain or shortness of breath seek  medical attention.  You need to lose weight.  Consider a lower calorie diet and regular exercise. 

## 2012-03-29 NOTE — Progress Notes (Signed)
  Subjective:    Patient ID: [name], female    DOB: 1950-12-08, 61 y.o.   MRN: [id number]  HPI This is for Dr. Amador Cunas   Review of Systems     Objective:   Physical Exam        Assessment & Plan:

## 2012-04-11 ENCOUNTER — Encounter: Payer: Self-pay | Admitting: Internal Medicine

## 2012-05-27 ENCOUNTER — Encounter: Payer: Self-pay | Admitting: Internal Medicine

## 2012-05-27 ENCOUNTER — Ambulatory Visit (INDEPENDENT_AMBULATORY_CARE_PROVIDER_SITE_OTHER): Payer: BC Managed Care – PPO | Admitting: Internal Medicine

## 2012-05-27 VITALS — BP 124/78 | Temp 98.1°F | Wt 221.0 lb

## 2012-05-27 DIAGNOSIS — M545 Low back pain, unspecified: Secondary | ICD-10-CM

## 2012-05-27 DIAGNOSIS — M25559 Pain in unspecified hip: Secondary | ICD-10-CM

## 2012-05-27 DIAGNOSIS — M25551 Pain in right hip: Secondary | ICD-10-CM

## 2012-05-27 MED ORDER — TRAMADOL HCL 50 MG PO TABS: 100.0000 mg | ORAL_TABLET | Freq: Four times a day (QID) | ORAL | Status: DC | PRN

## 2012-05-27 NOTE — Patient Instructions (Signed)
You  may move around, but avoid painful motions and activities.    Call or return to clinic prn if these symptoms worsen or fail to improve as anticipated.   

## 2012-05-27 NOTE — Progress Notes (Signed)
Subjective:    Patient ID: [name], female    DOB: 08-24-1951, 61 y.o.   MRN: [id number]  HPI  61 year old patient who has a history of intermittent low back pain. For the past 3 weeks she has had some right hip and right knee pain. She's also has some bilateral foot pain. She states she sustained a fracture to the left foot and more recently has been using a right foot boots do to pain in the right foot. No recent trauma. She has been using Ultram with benefit as well as anti-inflammatory medication  Past Medical History  Diagnosis Date  . Acute bronchitis   . Pain in joint, ankle and foot   . Unspecified asthma   . Backache, unspecified   . Other chest pain   . Diverticulosis of colon (without mention of hemorrhage)   . Lumbago   . Edema   . Sarcoidosis   . Tibialis tendinitis   . Obesity     History   Social History  . Marital Status: Married    Spouse Name: N/A    Number of Children: N/A  . Years of Education: N/A   Occupational History  . Not on file.   Social History Main Topics  . Smoking status: Never Smoker   . Smokeless tobacco: Never Used  . Alcohol Use: 0.5 oz/week    1 drink(s) per week  . Drug Use: No  . Sexually Active: Not on file   Other Topics Concern  . Not on file   Social History Narrative  . No narrative on file    Past Surgical History  Procedure Date  . Hand surgery   . Abdominal hysterectomy     Family History  Problem Relation Age of Onset  . Hypertension Mother   . Stroke Mother   . Diabetes Father   . Sarcoidosis Sister   . Cancer Brother     prostate    Allergies  Allergen Reactions  . Ibuprofen     Current Outpatient Prescriptions on File Prior to Visit  Medication Sig Dispense Refill  . albuterol (PROAIR HFA) 108 (90 BASE) MCG/ACT inhaler Inhale 2 puffs into the lungs every 6 (six) hours as needed.        . brimonidine (ALPHAGAN) 0.2 % ophthalmic solution Place 1 drop into the right eye daily.        .  busPIRone (BUSPAR) 15 MG tablet Take 1 tablet (15 mg total) by mouth 2 (two) times daily.  180 tablet  3  . estradiol (ESTRACE) 1 MG tablet Take 1 tablet (1 mg total) by mouth daily.  90 tablet  3  . fexofenadine-pseudoephedrine (ALLEGRA-D) 60-120 MG per tablet Take 1 tablet by mouth 2 (two) times daily.        . Fluticasone-Salmeterol (ADVAIR DISKUS) 250-50 MCG/DOSE AEPB Inhale 1 puff into the lungs 2 (two) times daily.  180 each  3  . furosemide (LASIX) 40 MG tablet Take 1 tablet (40 mg total) by mouth daily. prn  90 tablet  4  . omeprazole (PRILOSEC) 20 MG capsule Take 1 capsule (20 mg total) by mouth daily.  90 capsule  4  . RABEprazole (ACIPHEX) 20 MG tablet Take 20 mg by mouth daily.        . sertraline (ZOLOFT) 50 MG tablet Take 1 tablet (50 mg total) by mouth daily.  30 tablet  3  . diclofenac (VOLTAREN) 75 MG EC tablet Take 75 mg by mouth 2 (two) times  daily.          BP 124/78  Temp 98.1 F (36.7 C) (Oral)  Wt 221 lb (100.245 kg)      Review of Systems  Constitutional: Negative.   HENT: Negative for hearing loss, congestion, sore throat, rhinorrhea, dental problem, sinus pressure and tinnitus.   Eyes: Negative for pain, discharge and visual disturbance.  Respiratory: Negative for cough and shortness of breath.   Cardiovascular: Negative for chest pain, palpitations and leg swelling.  Gastrointestinal: Negative for nausea, vomiting, abdominal pain, diarrhea, constipation, blood in stool and abdominal distention.  Genitourinary: Negative for dysuria, urgency, frequency, hematuria, flank pain, vaginal bleeding, vaginal discharge, difficulty urinating, vaginal pain and pelvic pain.  Musculoskeletal: Positive for back pain, arthralgias and gait problem. Negative for joint swelling.  Skin: Negative for rash.  Neurological: Negative for dizziness, syncope, speech difficulty, weakness, numbness and headaches.  Hematological: Negative for adenopathy.  Psychiatric/Behavioral: Negative  for behavioral problems, dysphoric mood and agitation. The patient is not nervous/anxious.        Objective:   Physical Exam  Constitutional: She appears well-developed and well-nourished. No distress.  Musculoskeletal:       Negative straight leg test Range of motion right hip full Right foot boot in place          Assessment & Plan:   Right hip and right knee pain probable overuse syndrome secondary to foot pain and altered gait We'll continue tramadol and anti-inflammatory medications Will call if unimproved

## 2012-06-11 ENCOUNTER — Other Ambulatory Visit: Payer: Self-pay

## 2012-06-11 MED ORDER — ESTRADIOL 1 MG PO TABS: 1.0000 mg | ORAL_TABLET | Freq: Every day | ORAL | Status: DC

## 2012-06-24 ENCOUNTER — Ambulatory Visit (INDEPENDENT_AMBULATORY_CARE_PROVIDER_SITE_OTHER): Payer: BC Managed Care – PPO | Admitting: Internal Medicine

## 2012-06-24 ENCOUNTER — Encounter: Payer: Self-pay | Admitting: Internal Medicine

## 2012-06-24 VITALS — BP 122/84 | HR 92

## 2012-06-24 DIAGNOSIS — M549 Dorsalgia, unspecified: Secondary | ICD-10-CM

## 2012-06-24 DIAGNOSIS — M545 Low back pain, unspecified: Secondary | ICD-10-CM

## 2012-06-24 DIAGNOSIS — Z23 Encounter for immunization: Secondary | ICD-10-CM

## 2012-06-24 MED ORDER — HYDROCODONE-ACETAMINOPHEN 5-500 MG PO TABS: 1.0000 | ORAL_TABLET | Freq: Three times a day (TID) | ORAL | Status: DC | PRN

## 2012-06-24 NOTE — Patient Instructions (Signed)
You  may move around, but avoid painful motions and activities.  Apply  Heat  to the sore area for 15 to 20 minutes 3 or 4 times daily for the next two to 3 days. 

## 2012-06-24 NOTE — Progress Notes (Signed)
Subjective:    Patient ID: [name], female    DOB: 01-11-1951, 61 y.o.   MRN: [id number]  HPI  61 year old patient who has a history of chronic low back pain. She was seen one month ago with some clinical worsening it was felt in part related to overuse and aggravated by a left foot injury. She continues to use a left foot boot. 4 days ago she fell  when she became somewhat lightheaded. Over the weekend she has had aggravation of the lower back pain since her fall and also continues to complain of some intermittent dizziness.  Past Medical History  Diagnosis Date  . Acute bronchitis   . Pain in joint, ankle and foot   . Unspecified asthma   . Backache, unspecified   . Other chest pain   . Diverticulosis of colon (without mention of hemorrhage)   . Lumbago   . Edema   . Sarcoidosis   . Tibialis tendinitis   . Obesity     History   Social History  . Marital Status: Married    Spouse Name: N/A    Number of Children: N/A  . Years of Education: N/A   Occupational History  . Not on file.   Social History Main Topics  . Smoking status: Never Smoker   . Smokeless tobacco: Never Used  . Alcohol Use: 0.5 oz/week    1 drink(s) per week  . Drug Use: No  . Sexually Active: Not on file   Other Topics Concern  . Not on file   Social History Narrative  . No narrative on file    Past Surgical History  Procedure Date  . Hand surgery   . Abdominal hysterectomy     Family History  Problem Relation Age of Onset  . Hypertension Mother   . Stroke Mother   . Diabetes Father   . Sarcoidosis Sister   . Cancer Brother     prostate    Allergies  Allergen Reactions  . Ibuprofen     Current Outpatient Prescriptions on File Prior to Visit  Medication Sig Dispense Refill  . albuterol (PROAIR HFA) 108 (90 BASE) MCG/ACT inhaler Inhale 2 puffs into the lungs every 6 (six) hours as needed.        . brimonidine (ALPHAGAN) 0.2 % ophthalmic solution Place 1 drop into the  right eye daily.        . busPIRone (BUSPAR) 15 MG tablet Take 1 tablet (15 mg total) by mouth 2 (two) times daily.  180 tablet  3  . diclofenac (VOLTAREN) 75 MG EC tablet Take 75 mg by mouth 2 (two) times daily.        Marland Kitchen estradiol (ESTRACE) 1 MG tablet Take 1 tablet (1 mg total) by mouth daily.  90 tablet  3  . fexofenadine-pseudoephedrine (ALLEGRA-D) 60-120 MG per tablet Take 1 tablet by mouth 2 (two) times daily.        . Fluticasone-Salmeterol (ADVAIR DISKUS) 250-50 MCG/DOSE AEPB Inhale 1 puff into the lungs 2 (two) times daily.  180 each  3  . furosemide (LASIX) 40 MG tablet Take 1 tablet (40 mg total) by mouth daily. prn  90 tablet  4  . omeprazole (PRILOSEC) 20 MG capsule Take 1 capsule (20 mg total) by mouth daily.  90 capsule  4  . sertraline (ZOLOFT) 50 MG tablet Take 1 tablet (50 mg total) by mouth daily.  30 tablet  3  . traMADol (ULTRAM) 50 MG tablet Take  2 tablets (100 mg total) by mouth every 6 (six) hours as needed.  90 tablet  5    BP 122/84  Pulse 92  SpO2 97%     Review of Systems  Musculoskeletal: Positive for back pain, arthralgias and gait problem.  Neurological: Positive for light-headedness.       Objective:   Physical Exam  Constitutional: She is oriented to person, place, and time. She appears well-developed and well-nourished.       Blood pressure 150/90 after transferring to a sitting position on the examining table with considerable effort She complained of dizziness and lightheadedness  HENT:  Head: Normocephalic.  Right Ear: External ear normal.  Left Ear: External ear normal.  Mouth/Throat: Oropharynx is clear and moist.  Eyes: Conjunctivae normal and EOM are normal. Pupils are equal, round, and reactive to light.  Neck: Normal range of motion. Neck supple. No thyromegaly present.  Cardiovascular: Normal rate, regular rhythm, normal heart sounds and intact distal pulses.   Pulmonary/Chest: Effort normal and breath sounds normal.  Abdominal: Soft.  Bowel sounds are normal. She exhibits no mass. There is no tenderness.  Musculoskeletal: Normal range of motion.  Lymphadenopathy:    She has no cervical adenopathy.  Neurological: She is alert and oriented to person, place, and time.  Skin: Skin is warm and dry. No rash noted.  Psychiatric: She has a normal mood and affect. Her behavior is normal.          Assessment & Plan:   Dizziness. No evidence of orthostasis Aggravation of low back pain secondary to recent fall  We'll continue symptomatic treatment Additional analgesics prescribed

## 2012-09-23 ENCOUNTER — Other Ambulatory Visit: Payer: Self-pay | Admitting: Obstetrics and Gynecology

## 2012-09-23 DIAGNOSIS — R921 Mammographic calcification found on diagnostic imaging of breast: Secondary | ICD-10-CM

## 2012-10-08 ENCOUNTER — Ambulatory Visit
Admission: RE | Admit: 2012-10-08 | Discharge: 2012-10-08 | Disposition: A | Discharge status: Home / Self Care | Payer: BC Managed Care – PPO | Source: Ambulatory Visit | Attending: Obstetrics and Gynecology | Admitting: Obstetrics and Gynecology

## 2012-10-08 DIAGNOSIS — R921 Mammographic calcification found on diagnostic imaging of breast: Secondary | ICD-10-CM

## 2013-05-19 ENCOUNTER — Other Ambulatory Visit: Payer: Self-pay | Admitting: *Deleted

## 2013-05-19 ENCOUNTER — Other Ambulatory Visit: Payer: Self-pay | Admitting: Obstetrics and Gynecology

## 2013-05-19 DIAGNOSIS — R921 Mammographic calcification found on diagnostic imaging of breast: Secondary | ICD-10-CM

## 2013-05-19 MED ORDER — FLUTICASONE-SALMETEROL 250-50 MCG/DOSE IN AEPB: 1.0000 | INHALATION_SPRAY | Freq: Two times a day (BID) | RESPIRATORY_TRACT | Status: DC

## 2013-05-20 ENCOUNTER — Ambulatory Visit (INDEPENDENT_AMBULATORY_CARE_PROVIDER_SITE_OTHER): Payer: BC Managed Care – PPO | Admitting: Internal Medicine

## 2013-05-20 ENCOUNTER — Encounter: Payer: Self-pay | Admitting: Internal Medicine

## 2013-05-20 VITALS — BP 130/80 | HR 95 | Temp 98.3°F | Resp 20 | Wt 227.0 lb

## 2013-05-20 DIAGNOSIS — D869 Sarcoidosis, unspecified: Secondary | ICD-10-CM

## 2013-05-20 DIAGNOSIS — J45909 Unspecified asthma, uncomplicated: Secondary | ICD-10-CM

## 2013-05-20 MED ORDER — SERTRALINE HCL 50 MG PO TABS: 50.0000 mg | ORAL_TABLET | Freq: Every day | ORAL | Status: DC

## 2013-05-20 MED ORDER — DICLOFENAC SODIUM 75 MG PO TBEC: 75.0000 mg | DELAYED_RELEASE_TABLET | Freq: Two times a day (BID) | ORAL | Status: DC

## 2013-05-20 MED ORDER — TRAMADOL HCL 50 MG PO TABS: 100.0000 mg | ORAL_TABLET | Freq: Four times a day (QID) | ORAL | Status: DC | PRN

## 2013-05-20 MED ORDER — OMEPRAZOLE 20 MG PO CPDR: 20.0000 mg | DELAYED_RELEASE_CAPSULE | Freq: Every day | ORAL | Status: DC

## 2013-05-20 MED ORDER — FUROSEMIDE 40 MG PO TABS: 40.0000 mg | ORAL_TABLET | Freq: Every day | ORAL | Status: DC

## 2013-05-20 NOTE — Patient Instructions (Signed)
Limit your sodium (Salt) intake  Return in 3 months for follow-up   

## 2013-05-20 NOTE — Progress Notes (Signed)
Subjective:    Patient ID: [name], female    DOB: [birth year], 62 y.o.   MRN: [id number]  HPI  62 year old patient whose stable medical problems include sarcoidosis and asthma. She has not been seen in almost one year. She presents with a two-week history of increasing fatigue and some intermittent diarrhea. She seems overexpanded as a primary caregiver.  There is been some modest weight gain over the past year. She has low back pain and does use Diflucan periodically. Her asthma has been stable  Wt Readings from Last 3 Encounters:  05/20/13 227 lb (102.967 kg)  05/27/12 221 lb (100.245 kg)  03/28/12 220 lb (99.791 kg)    Past Medical History  Diagnosis Date  . Acute bronchitis   . Pain in joint, ankle and foot   . Unspecified asthma(493.90)   . Backache, unspecified   . Other chest pain   . Diverticulosis of colon (without mention of hemorrhage)   . Lumbago   . Edema   . Sarcoidosis   . Tibialis tendinitis   . Obesity     History   Social History  . Marital Status: Married    Spouse Name: N/A    Number of Children: N/A  . Years of Education: N/A   Occupational History  . Not on file.   Social History Main Topics  . Smoking status: Never Smoker   . Smokeless tobacco: Never Used  . Alcohol Use: 0.5 oz/week    1 drink(s) per week  . Drug Use: No  . Sexual Activity: Not on file   Other Topics Concern  . Not on file   Social History Narrative  . No narrative on file    Past Surgical History  Procedure Laterality Date  . Hand surgery    . Abdominal hysterectomy      Family History  Problem Relation Age of Onset  . Hypertension Mother   . Stroke Mother   . Diabetes Father   . Sarcoidosis Sister   . Cancer Brother     prostate    Allergies  Allergen Reactions  . Ibuprofen     Current Outpatient Prescriptions on File Prior to Visit  Medication Sig Dispense Refill  . albuterol (PROAIR HFA) 108 (90 BASE) MCG/ACT inhaler Inhale 2 puffs into  the lungs every 6 (six) hours as needed.        . brimonidine (ALPHAGAN) 0.2 % ophthalmic solution Place 1 drop into the right eye daily.        Marland Kitchen estradiol (ESTRACE) 1 MG tablet Take 1 tablet (1 mg total) by mouth daily.  90 tablet  3  . fexofenadine-pseudoephedrine (ALLEGRA-D) 60-120 MG per tablet Take 1 tablet by mouth 2 (two) times daily.        . Fluticasone-Salmeterol (ADVAIR DISKUS) 250-50 MCG/DOSE AEPB Inhale 1 puff into the lungs 2 (two) times daily.  180 each  3   No current facility-administered medications on file prior to visit.    BP 130/80  Pulse 95  Temp(Src) 98.3 F (36.8 C) (Oral)  Resp 20  Wt 227 lb (102.967 kg)  BMI 44.33 kg/m2  SpO2 97%     Review of Systems  Constitutional: Positive for fatigue and unexpected weight change.  HENT: Negative for hearing loss, congestion, sore throat, rhinorrhea, dental problem, sinus pressure and tinnitus.   Eyes: Negative for pain, discharge and visual disturbance.  Respiratory: Negative for cough and shortness of breath.   Cardiovascular: Negative for chest pain, palpitations and  leg swelling.  Gastrointestinal: Positive for diarrhea. Negative for nausea, vomiting, abdominal pain, constipation, blood in stool and abdominal distention.  Genitourinary: Negative for dysuria, urgency, frequency, hematuria, flank pain, vaginal bleeding, vaginal discharge, difficulty urinating, vaginal pain and pelvic pain.  Musculoskeletal: Negative for joint swelling, arthralgias and gait problem.  Skin: Negative for rash.  Neurological: Negative for dizziness, syncope, speech difficulty, weakness, numbness and headaches.  Hematological: Negative for adenopathy.  Psychiatric/Behavioral: Negative for behavioral problems, dysphoric mood and agitation. The patient is not nervous/anxious.        Objective:   Physical Exam  Constitutional: She is oriented to person, place, and time. She appears well-developed and well-nourished.  Overweight   HENT:  Head: Normocephalic.  Right Ear: External ear normal.  Left Ear: External ear normal.  Mouth/Throat: Oropharynx is clear and moist.  Eyes: Conjunctivae and EOM are normal. Pupils are equal, round, and reactive to light.  Neck: Normal range of motion. Neck supple. No thyromegaly present.  Cardiovascular: Normal rate, regular rhythm, normal heart sounds and intact distal pulses.   Pulmonary/Chest: Effort normal and breath sounds normal.  Abdominal: Soft. Bowel sounds are normal. She exhibits no mass. There is no tenderness.  Musculoskeletal: Normal range of motion.  Lymphadenopathy:    She has no cervical adenopathy.  Neurological: She is alert and oriented to person, place, and time.  Skin: Skin is warm and dry. No rash noted.  Psychiatric: She has a normal mood and affect. Her behavior is normal.          Assessment & Plan:   Diarrhea Fatigue History of asthma History sarcoidosis Situational stress  We'll treat symptomatically. We'll schedule a complete physical at her convenience Medications refilled

## 2013-06-04 ENCOUNTER — Ambulatory Visit
Admission: RE | Admit: 2013-06-04 | Discharge: 2013-06-04 | Disposition: A | Discharge status: Home / Self Care | Payer: BC Managed Care – PPO | Source: Ambulatory Visit | Attending: Obstetrics and Gynecology | Admitting: Obstetrics and Gynecology

## 2013-06-04 DIAGNOSIS — R921 Mammographic calcification found on diagnostic imaging of breast: Secondary | ICD-10-CM

## 2013-06-17 ENCOUNTER — Ambulatory Visit: Payer: BC Managed Care – PPO

## 2013-06-19 ENCOUNTER — Ambulatory Visit (INDEPENDENT_AMBULATORY_CARE_PROVIDER_SITE_OTHER): Payer: BC Managed Care – PPO

## 2013-06-19 DIAGNOSIS — Z23 Encounter for immunization: Secondary | ICD-10-CM

## 2013-08-21 ENCOUNTER — Other Ambulatory Visit: Payer: Self-pay | Admitting: *Deleted

## 2013-08-21 MED ORDER — TRAMADOL HCL 50 MG PO TABS: 100.0000 mg | ORAL_TABLET | Freq: Four times a day (QID) | ORAL | Status: DC | PRN

## 2013-11-25 ENCOUNTER — Ambulatory Visit: Payer: BC Managed Care – PPO | Admitting: Internal Medicine

## 2013-11-27 ENCOUNTER — Ambulatory Visit: Payer: BC Managed Care – PPO | Admitting: Internal Medicine

## 2013-12-01 ENCOUNTER — Ambulatory Visit: Payer: BC Managed Care – PPO | Admitting: Family Medicine

## 2013-12-03 ENCOUNTER — Ambulatory Visit (INDEPENDENT_AMBULATORY_CARE_PROVIDER_SITE_OTHER): Payer: BC Managed Care – PPO | Admitting: Family Medicine

## 2013-12-03 ENCOUNTER — Encounter: Payer: Self-pay | Admitting: Family Medicine

## 2013-12-03 ENCOUNTER — Ambulatory Visit (INDEPENDENT_AMBULATORY_CARE_PROVIDER_SITE_OTHER)
Admission: RE | Admit: 2013-12-03 | Discharge: 2013-12-03 | Disposition: A | Discharge status: Home / Self Care | Payer: BC Managed Care – PPO | Source: Ambulatory Visit | Attending: Family Medicine | Admitting: Family Medicine

## 2013-12-03 VITALS — BP 140/70 | HR 115 | Temp 98.4°F | Wt 227.0 lb

## 2013-12-03 DIAGNOSIS — R062 Wheezing: Secondary | ICD-10-CM

## 2013-12-03 DIAGNOSIS — M549 Dorsalgia, unspecified: Secondary | ICD-10-CM

## 2013-12-03 DIAGNOSIS — M778 Other enthesopathies, not elsewhere classified: Secondary | ICD-10-CM

## 2013-12-03 DIAGNOSIS — M65839 Other synovitis and tenosynovitis, unspecified forearm: Secondary | ICD-10-CM

## 2013-12-03 DIAGNOSIS — M65849 Other synovitis and tenosynovitis, unspecified hand: Secondary | ICD-10-CM

## 2013-12-03 MED ORDER — CELECOXIB 200 MG PO CAPS: 200.0000 mg | ORAL_CAPSULE | Freq: Every day | ORAL | Status: DC

## 2013-12-03 MED ORDER — METHYLPREDNISOLONE ACETATE 40 MG/ML IJ SUSP
20.0000 mg | Freq: Once | INTRAMUSCULAR | Status: AC
  Administered 2013-12-03: 20 mg via INTRALESIONAL

## 2013-12-03 NOTE — Patient Instructions (Signed)
De Quervain's Disease Harriet Pho disease is a condition often seen in racquet sports where there is a soreness (inflammation) in the cord like structures (tendons) which attach muscle to bone on the thumb side of the wrist. There may be a tightening of the tissuesaround the tendons. This condition is often helped by giving up or modifying the activity which caused it. When conservative treatment does not help, surgery may be required. Conservative treatment could include changes in the activity which brought about the problem or made it worse. Anti-inflammatory medications and injections may be used to help decrease the inflammation and help with pain control. Your caregiver will help you determine which is best for you. DIAGNOSIS  Often the diagnosis (learning what is wrong) can be made by examination. Sometimes x-rays are required. HOME CARE INSTRUCTIONS   Apply ice to the sore area for 15-20 minutes, 03-04 times per day while awake. Put the ice in a plastic bag and place a towel between the bag of ice and your skin. This is especially helpful if it can be done after all activities involving the sore wrist.  Temporary splinting may help.  Only take over-the-counter or prescription medicines for pain, discomfort or fever as directed by your caregiver. SEEK MEDICAL CARE IF:   Pain relief is not obtained with medications, or if you have increasing pain and seem to be getting worse rather than better. MAKE SURE YOU:   Understand these instructions.  Will watch your condition.  Will get help right away if you are not doing well or get worse. Document Released: 06/13/2001 Document Revised: 12/11/2011 Document Reviewed: 09/18/2005 [facility] Patient Information 2014 [place].  Consider thumb spica splint for wrist. Touch base in 2 weeks if no better.

## 2013-12-03 NOTE — Progress Notes (Signed)
Subjective:    Patient ID: [name], female    DOB: November 26, 1950, 63 y.o.   MRN: [id number]  HPI Patient here to discuss several items as below  She has history of arthritis involving multiple joints and has taken Celebrex in the past. She is currently diclofenac but feels this does not work as well. She is requesting prescription for Celebrex which she has tolerated well the past.  She has new problem with right wrist pain. She is right-hand dominant. No injury. She does a lot of typing. She has pain along the abductor and extensor tendons of the right thumb. She has not tried any consistent icing. Pain is worse with movement.  Patient has had complaints of some intermittent possible wheezing in her left lung off and on past couple weeks. She reports remote history of sarcoidosis. She's only had rare cough. No dyspnea. No hemoptysis. No pleuritic pain. No appetite or weight changes. Patient nonsmoker. She's been placed on Advair and albuterol for asthma but has not had any active symptoms except for the past couple weeks  Past Medical History  Diagnosis Date  . Acute bronchitis   . Pain in joint, ankle and foot   . Unspecified asthma(493.90)   . Backache, unspecified   . Other chest pain   . Diverticulosis of colon (without mention of hemorrhage)   . Lumbago   . Edema   . Sarcoidosis   . Tibialis tendinitis   . Obesity    Past Surgical History  Procedure Laterality Date  . Hand surgery    . Abdominal hysterectomy      reports that she has never smoked. She has never used smokeless tobacco. She reports that she drinks about 0.5 ounces of alcohol per week. She reports that she does not use illicit drugs. family history includes Cancer in her brother; Diabetes in her father; Hypertension in her mother; Sarcoidosis in her sister; Stroke in her mother. Allergies  Allergen Reactions  . Ibuprofen       Review of Systems  Constitutional: Negative for fever, chills, appetite  change and unexpected weight change.  Respiratory: Positive for cough and wheezing. Negative for shortness of breath.   Cardiovascular: Negative for chest pain.  Neurological: Negative for weakness.       Objective:   Physical Exam  Constitutional: She appears well-developed and well-nourished.  Neck: Neck supple. No thyromegaly present.  Cardiovascular: Normal rate.  Exam reveals no gallop and no friction rub.   No murmur heard. Pulmonary/Chest: Effort normal and breath sounds normal. No respiratory distress. She has no wheezes. She has no rales.  Musculoskeletal:  Right wrist reveals full range of motion. No visible edema. No warmth. No erythema. She has some tenderness with abduction and abduction of the. She has tenderness along the extensor tendon.  Neurological:  Full-strength right hand and wrist          Assessment & Plan:  #1 history of osteoarthritis. Change back to Celebrex 200 mg once daily per patient request #2 probable de Quervain's tenosynovitis right wrist. Recommend icing. Consider thumb spica splint. We discussed risks and benefits of corticosteroid injection patient requested that we proceed. We discussed risks including bruising, infection, and bleeding. Skin prepped with Betadine. Using #25-gauge 5/8 inch needle, injected 20 mg Depo-Medrol and 1/2 cc of plain Xylocaine. Would recommend icing and splinting as above. Touch base 2 weeks if no improvement #3 reported localized wheezing the left lung. She does not have any stridor or audible  wheezing today. Symmetric breath sounds. Check chest x-ray.

## 2013-12-03 NOTE — Progress Notes (Signed)
Pre visit review using our clinic review tool, if applicable. No additional management support is needed unless otherwise documented below in the visit note. 

## 2014-01-06 ENCOUNTER — Encounter: Payer: Self-pay | Admitting: Internal Medicine

## 2014-01-23 ENCOUNTER — Telehealth: Payer: Self-pay | Admitting: Internal Medicine

## 2014-01-23 NOTE — Telephone Encounter (Signed)
Noted  

## 2014-01-23 NOTE — Telephone Encounter (Signed)
Lennette Bihari from [facility] called back and was able to approve the PA. #[id number] 01/23/14-01/24/15

## 2014-01-23 NOTE — Telephone Encounter (Signed)
I received a PA request for Celebrex and submitted for the pt.  I received a denial back stating the pt needed to have tried and failed 2 NSAIDS.  When I submitted the form, that information was provided but [employer] entered it incorrectly.  I was advised that I needed to submit a Provider Courtesy Review on the pt's behalf, which I did.  I followed up on the review and was advised by Ron at [facility] that this should not have gone to them but [employer] [facility] should have handled the order enter error on their end.  I spoke back to [employer] and requested a Team Lead since I was not getting any resolution to the problem.  I was transferred to Dutch Quint in their RS Department and he stated that he had to send the request to the Pharmacy team for review.  I am currently waiting on a callback from that department on the issue.

## 2014-03-27 ENCOUNTER — Ambulatory Visit (INDEPENDENT_AMBULATORY_CARE_PROVIDER_SITE_OTHER): Payer: BC Managed Care – PPO | Admitting: Internal Medicine

## 2014-03-27 ENCOUNTER — Encounter: Payer: Self-pay | Admitting: Internal Medicine

## 2014-03-27 ENCOUNTER — Ambulatory Visit (HOSPITAL_COMMUNITY)
Admission: RE | Admit: 2014-03-27 | Discharge: 2014-03-27 | Disposition: A | Discharge status: Home / Self Care | Payer: BC Managed Care – PPO | Source: Ambulatory Visit | Attending: Internal Medicine | Admitting: Internal Medicine

## 2014-03-27 VITALS — BP 148/90 | HR 111 | Temp 98.8°F | Wt 225.0 lb

## 2014-03-27 DIAGNOSIS — K5732 Diverticulitis of large intestine without perforation or abscess without bleeding: Secondary | ICD-10-CM

## 2014-03-27 DIAGNOSIS — K5730 Diverticulosis of large intestine without perforation or abscess without bleeding: Secondary | ICD-10-CM

## 2014-03-27 DIAGNOSIS — N289 Disorder of kidney and ureter, unspecified: Secondary | ICD-10-CM | POA: Insufficient documentation

## 2014-03-27 DIAGNOSIS — M479 Spondylosis, unspecified: Secondary | ICD-10-CM | POA: Insufficient documentation

## 2014-03-27 DIAGNOSIS — R1032 Left lower quadrant pain: Secondary | ICD-10-CM | POA: Insufficient documentation

## 2014-03-27 LAB — CREATININE, SERUM: CREATININE: 0.54 mg/dL (ref 0.50–1.10)

## 2014-03-27 MED ORDER — HYDROCODONE-ACETAMINOPHEN 10-325 MG PO TABS: 1.0000 | ORAL_TABLET | Freq: Three times a day (TID) | ORAL | Status: DC | PRN

## 2014-03-27 MED ORDER — IOHEXOL 300 MG/ML  SOLN
100.0000 mL | Freq: Once | INTRAMUSCULAR | Status: AC | PRN
  Administered 2014-03-27: 100 mL via INTRAVENOUS

## 2014-03-27 MED ORDER — CIPROFLOXACIN HCL 500 MG PO TABS: 500.0000 mg | ORAL_TABLET | Freq: Two times a day (BID) | ORAL | Status: DC

## 2014-03-27 MED ORDER — METRONIDAZOLE 500 MG PO TABS: 500.0000 mg | ORAL_TABLET | Freq: Three times a day (TID) | ORAL | Status: DC

## 2014-03-27 NOTE — Progress Notes (Signed)
Pre visit review using our clinic review tool, if applicable. No additional management support is needed unless otherwise documented below in the visit note. 

## 2014-03-27 NOTE — Progress Notes (Signed)
Subjective:    Patient ID: [name], female    DOB: 12-May-1951, 63 y.o.   MRN: [id number]  HPI  63 year old patient who has a history of known diverticular disease.  Throughout the week, she has developed worsening left lower quadrant pain.  There's been no nausea, vomiting, diarrhea, or change in her bowel habits.  No dysuria.  Social history- her husband died yesterday  Past Medical History  Diagnosis Date  . Acute bronchitis   . Pain in joint, ankle and foot   . Unspecified asthma(493.90)   . Backache, unspecified   . Other chest pain   . Diverticulosis of colon (without mention of hemorrhage)   . Lumbago   . Edema   . Sarcoidosis   . Tibialis tendinitis   . Obesity     History   Social History  . Marital Status: Married    Spouse Name: N/A    Number of Children: N/A  . Years of Education: N/A   Occupational History  . Not on file.   Social History Main Topics  . Smoking status: Never Smoker   . Smokeless tobacco: Never Used  . Alcohol Use: 0.5 oz/week    1 drink(s) per week  . Drug Use: No  . Sexual Activity: Not on file   Other Topics Concern  . Not on file   Social History Narrative  . No narrative on file    Past Surgical History  Procedure Laterality Date  . Hand surgery    . Abdominal hysterectomy      Family History  Problem Relation Age of Onset  . Hypertension Mother   . Stroke Mother   . Diabetes Father   . Sarcoidosis Sister   . Cancer Brother     prostate    Allergies  Allergen Reactions  . Ibuprofen     Current Outpatient Prescriptions on File Prior to Visit  Medication Sig Dispense Refill  . albuterol (PROAIR HFA) 108 (90 BASE) MCG/ACT inhaler Inhale 2 puffs into the lungs every 6 (six) hours as needed.        . brimonidine (ALPHAGAN) 0.2 % ophthalmic solution Place 1 drop into the right eye daily.        . celecoxib (CELEBREX) 200 MG capsule Take 1 capsule (200 mg total) by mouth daily.  30 capsule  6  . estradiol  (ESTRACE) 1 MG tablet Take 1 tablet (1 mg total) by mouth daily.  90 tablet  3  . fexofenadine-pseudoephedrine (ALLEGRA-D) 60-120 MG per tablet Take 1 tablet by mouth 2 (two) times daily.        . Fluticasone-Salmeterol (ADVAIR DISKUS) 250-50 MCG/DOSE AEPB Inhale 1 puff into the lungs 2 (two) times daily.  180 each  3  . furosemide (LASIX) 40 MG tablet Take 1 tablet (40 mg total) by mouth daily. prn  30 tablet  5  . omeprazole (PRILOSEC) 20 MG capsule Take 1 capsule (20 mg total) by mouth daily.  30 capsule  5  . sertraline (ZOLOFT) 50 MG tablet Take 1 tablet (50 mg total) by mouth daily.  30 tablet  5  . traMADol (ULTRAM) 50 MG tablet Take 2 tablets (100 mg total) by mouth every 6 (six) hours as needed.  90 tablet  2   No current facility-administered medications on file prior to visit.    BP 148/90  Pulse 111  Temp(Src) 98.8 F (37.1 C) (Oral)  Wt 225 lb (102.059 kg)  SpO2 97%  Review of Systems  Constitutional: Positive for activity change, appetite change and fatigue.  HENT: Negative for congestion, dental problem, hearing loss, rhinorrhea, sinus pressure, sore throat and tinnitus.   Eyes: Negative for pain, discharge and visual disturbance.  Respiratory: Negative for cough and shortness of breath.   Cardiovascular: Negative for chest pain, palpitations and leg swelling.  Gastrointestinal: Positive for abdominal pain. Negative for nausea, vomiting, diarrhea, constipation, blood in stool and abdominal distention.  Genitourinary: Negative for dysuria, urgency, frequency, hematuria, flank pain, vaginal bleeding, vaginal discharge, difficulty urinating, vaginal pain and pelvic pain.  Musculoskeletal: Negative for arthralgias, gait problem and joint swelling.  Skin: Negative for rash.  Neurological: Negative for dizziness, syncope, speech difficulty, weakness, numbness and headaches.  Hematological: Negative for adenopathy.  Psychiatric/Behavioral: Negative for behavioral  problems, dysphoric mood and agitation. The patient is not nervous/anxious.        Objective:   Physical Exam  Constitutional: She is oriented to person, place, and time. She appears well-developed and well-nourished.  Afebrile Pulse 100 Repeat blood pressure 140/74  HENT:  Head: Normocephalic.  Right Ear: External ear normal.  Left Ear: External ear normal.  Mouth/Throat: Oropharynx is clear and moist.  Eyes: Conjunctivae and EOM are normal. Pupils are equal, round, and reactive to light.  Neck: Normal range of motion. Neck supple. No thyromegaly present.  Cardiovascular: Normal rate, regular rhythm, normal heart sounds and intact distal pulses.   Pulse 100  Pulmonary/Chest: Effort normal and breath sounds normal.  Abdominal: Soft. Bowel sounds are normal. She exhibits no mass. There is tenderness.  Marked left lower quadrant tenderness with rebound Mild guarding Active bowel sounds  Musculoskeletal: Normal range of motion.  Lymphadenopathy:    She has no cervical adenopathy.  Neurological: She is alert and oriented to person, place, and time.  Skin: Skin is warm and dry. No rash noted.  Psychiatric: She has a normal mood and affect. Her behavior is normal.          Assessment & Plan:  Acute diverticulitis.  We'll place on antibiotics and broad spectrum antibiotics.  We'll obtain an abdominal CT to rule out perforation/abscess

## 2014-03-27 NOTE — Patient Instructions (Addendum)
Abdominal CT scan as discussed  Take your antibiotic as prescribed until ALL of it is gone, but stop if you develop a rash, swelling, or any side effects of the medication.  Contact our office as soon as possible if  there are side effects of the medication.  Report to the emergency room and immediately if any clinical deteriorationDiverticulitis Diverticulitis is inflammation or infection of small pouches in your colon that form when you have a condition called diverticulosis. The pouches in your colon are called diverticula. Your colon, or large intestine, is where water is absorbed and stool is formed. Complications of diverticulitis can include:  Bleeding.  Severe infection.  Severe pain.  Perforation of your colon.  Obstruction of your colon. CAUSES  Diverticulitis is caused by bacteria. Diverticulitis happens when stool becomes trapped in diverticula. This allows bacteria to grow in the diverticula, which can lead to inflammation and infection. RISK FACTORS People with diverticulosis are at risk for diverticulitis. Eating a diet that does not include enough fiber from fruits and vegetables may make diverticulitis more likely to develop. SYMPTOMS  Symptoms of diverticulitis may include:  Abdominal pain and tenderness. The pain is normally located on the left side of the abdomen, but may occur in other areas.  Fever and chills.  Bloating.  Cramping.  Nausea.  Vomiting.  Constipation.  Diarrhea.  Blood in your stool. DIAGNOSIS  Your health care provider will ask you about your medical history and do a physical exam. You may need to have tests done because many medical conditions can cause the same symptoms as diverticulitis. Tests may include:  Blood tests.  Urine tests.  Imaging tests of the abdomen, including X-rays and CT scans. When your condition is under control, your health care provider may recommend that you have a colonoscopy. A colonoscopy can show how  severe your diverticula are and whether something else is causing your symptoms. TREATMENT  Most cases of diverticulitis are mild and can be treated at home. Treatment may include:  Taking over-the-counter pain medicines.  Following a clear liquid diet.  Taking antibiotic medicines by mouth for 7-10 days. More severe cases may be treated at a hospital. Treatment may include:  Not eating or drinking.  Taking prescription pain medicine.  Receiving antibiotic medicines through an IV tube.  Receiving fluids and nutrition through an IV tube.  Surgery. HOME CARE INSTRUCTIONS   Follow your health care provider's instructions carefully.  Follow a full liquid diet or other diet as directed by your health care provider. After your symptoms improve, your health care provider may tell you to change your diet. He or she may recommend you eat a high-fiber diet. Fruits and vegetables are good sources of fiber. Fiber makes it easier to pass stool.  Take fiber supplements or probiotics as directed by your health care provider.  Only take medicines as directed by your health care provider.  Keep all your follow-up appointments. SEEK MEDICAL CARE IF:   Your pain does not improve.  You have a hard time eating food.  Your bowel movements do not return to normal. SEEK IMMEDIATE MEDICAL CARE IF:   Your pain becomes worse.  Your symptoms do not get better.  Your symptoms suddenly get worse.  You have a fever.  You have repeated vomiting.  You have bloody or black, tarry stools. MAKE SURE YOU:   Understand these instructions.  Will watch your condition.  Will get help right away if you are not doing well or  get worse. Document Released: 06/28/2005 Document Revised: 09/23/2013 Document Reviewed: 08/13/2013 [facility] Patient Information 2015 [place], [place]. This information is not intended to replace advice given to you by your health care provider. Make sure you discuss any questions  you have with your health care provider.

## 2014-03-28 ENCOUNTER — Telehealth: Payer: Self-pay

## 2014-03-28 NOTE — Telephone Encounter (Signed)
Per Dr. Velora Mediate recommendations attempted to call pt and advise of CT results.

## 2014-03-30 NOTE — Telephone Encounter (Signed)
Per Dr. Regis Bill she attempted to call pt a few times with no return call.  Information given to Dr.K- he will attempt to call pt.

## 2014-05-26 ENCOUNTER — Ambulatory Visit (INDEPENDENT_AMBULATORY_CARE_PROVIDER_SITE_OTHER): Payer: Medicare Other | Admitting: Internal Medicine

## 2014-05-26 ENCOUNTER — Encounter: Payer: Self-pay | Admitting: Internal Medicine

## 2014-05-26 VITALS — BP 120/80 | HR 98 | Temp 97.9°F | Resp 20 | Ht 60.0 in | Wt 224.0 lb

## 2014-05-26 DIAGNOSIS — M25462 Effusion, left knee: Secondary | ICD-10-CM

## 2014-05-26 DIAGNOSIS — J45909 Unspecified asthma, uncomplicated: Secondary | ICD-10-CM | POA: Diagnosis not present

## 2014-05-26 DIAGNOSIS — K5730 Diverticulosis of large intestine without perforation or abscess without bleeding: Secondary | ICD-10-CM

## 2014-05-26 DIAGNOSIS — M25469 Effusion, unspecified knee: Secondary | ICD-10-CM | POA: Diagnosis not present

## 2014-05-26 MED ORDER — METHYLPREDNISOLONE ACETATE 80 MG/ML IJ SUSP
80.0000 mg | Freq: Once | INTRAMUSCULAR | Status: AC
  Administered 2014-05-26: 80 mg via INTRAMUSCULAR

## 2014-05-26 MED ORDER — HYDROCODONE-ACETAMINOPHEN 10-325 MG PO TABS: 1.0000 | ORAL_TABLET | Freq: Three times a day (TID) | ORAL | Status: DC | PRN

## 2014-05-26 NOTE — Progress Notes (Signed)
Pre visit review using our clinic review tool, if applicable. No additional management support is needed unless otherwise documented below in the visit note. 

## 2014-05-26 NOTE — Progress Notes (Signed)
Subjective:    Patient ID: [name], female    DOB: 15-Nov-1950, 63 y.o.   MRN: [id number]  HPI 63 year old patient who was seen approximately 6 weeks ago for acute diverticulitis.  She also lost her husband at that time.  For the past several days, she has had worsening knee pain, left greater than the right.  No trauma  Past Medical History  Diagnosis Date  . Acute bronchitis   . Pain in joint, ankle and foot   . Unspecified asthma(493.90)   . Backache, unspecified   . Other chest pain   . Diverticulosis of colon (without mention of hemorrhage)   . Lumbago   . Edema   . Sarcoidosis   . Tibialis tendinitis   . Obesity     History   Social History  . Marital Status: Married    Spouse Name: N/A    Number of Children: N/A  . Years of Education: N/A   Occupational History  . Not on file.   Social History Main Topics  . Smoking status: Never Smoker   . Smokeless tobacco: Never Used  . Alcohol Use: 0.5 oz/week    1 drink(s) per week  . Drug Use: No  . Sexual Activity: Not on file   Other Topics Concern  . Not on file   Social History Narrative  . No narrative on file    Past Surgical History  Procedure Laterality Date  . Hand surgery    . Abdominal hysterectomy      Family History  Problem Relation Age of Onset  . Hypertension Mother   . Stroke Mother   . Diabetes Father   . Sarcoidosis Sister   . Cancer Brother     prostate    Allergies  Allergen Reactions  . Ibuprofen     Current Outpatient Prescriptions on File Prior to Visit  Medication Sig Dispense Refill  . albuterol (PROAIR HFA) 108 (90 BASE) MCG/ACT inhaler Inhale 2 puffs into the lungs every 6 (six) hours as needed.        . brimonidine (ALPHAGAN) 0.2 % ophthalmic solution Place 1 drop into the right eye daily.        . celecoxib (CELEBREX) 200 MG capsule Take 1 capsule (200 mg total) by mouth daily.  30 capsule  6  . estradiol (ESTRACE) 1 MG tablet Take 1 tablet (1 mg total) by  mouth daily.  90 tablet  3  . fexofenadine-pseudoephedrine (ALLEGRA-D) 60-120 MG per tablet Take 1 tablet by mouth 2 (two) times daily.        . Fluticasone-Salmeterol (ADVAIR DISKUS) 250-50 MCG/DOSE AEPB Inhale 1 puff into the lungs 2 (two) times daily.  180 each  3  . furosemide (LASIX) 40 MG tablet Take 1 tablet (40 mg total) by mouth daily. prn  30 tablet  5  . omeprazole (PRILOSEC) 20 MG capsule Take 1 capsule (20 mg total) by mouth daily.  30 capsule  5  . traMADol (ULTRAM) 50 MG tablet Take 2 tablets (100 mg total) by mouth every 6 (six) hours as needed.  90 tablet  2   No current facility-administered medications on file prior to visit.    BP 120/80  Pulse 98  Temp(Src) 97.9 F (36.6 C) (Oral)  Resp 20  Ht 5' (1.524 m)  Wt 224 lb (101.606 kg)  BMI 43.75 kg/m2  SpO2 97%      Review of Systems  Constitutional: Negative.   HENT: Negative for congestion,  dental problem, hearing loss, rhinorrhea, sinus pressure, sore throat and tinnitus.   Eyes: Negative for pain, discharge and visual disturbance.  Respiratory: Negative for cough and shortness of breath.   Cardiovascular: Negative for chest pain, palpitations and leg swelling.  Gastrointestinal: Negative for nausea, vomiting, abdominal pain, diarrhea, constipation, blood in stool and abdominal distention.  Genitourinary: Negative for dysuria, urgency, frequency, hematuria, flank pain, vaginal bleeding, vaginal discharge, difficulty urinating, vaginal pain and pelvic pain.  Musculoskeletal: Positive for arthralgias, gait problem and joint swelling.  Skin: Negative for rash.  Neurological: Negative for dizziness, syncope, speech difficulty, weakness, numbness and headaches.  Hematological: Negative for adenopathy.  Psychiatric/Behavioral: Negative for behavioral problems, dysphoric mood and agitation. The patient is not nervous/anxious.        Objective:   Physical Exam  Constitutional: She appears well-developed and  well-nourished. No distress.  Overweight Blood pressure 120/78 Walks with a pronounced limp  Musculoskeletal:   Left knee-suggestion of an effusion; Warm to touch and tender            Assessment & Plan:   Bilateral knee pain right greater than left. We'll continue Celebrex and analgesics.  We'll treat with Depo-Medrol 80.  If unimproved will followup with grams per orthopedics;

## 2014-05-26 NOTE — Patient Instructions (Signed)
Call or return to clinic prn if these symptoms worsen or fail to improve as anticipated.

## 2014-05-29 ENCOUNTER — Other Ambulatory Visit: Payer: Self-pay | Admitting: Obstetrics and Gynecology

## 2014-05-29 DIAGNOSIS — R921 Mammographic calcification found on diagnostic imaging of breast: Secondary | ICD-10-CM

## 2014-06-05 ENCOUNTER — Ambulatory Visit
Admission: RE | Admit: 2014-06-05 | Discharge: 2014-06-05 | Disposition: A | Discharge status: Home / Self Care | Payer: BC Managed Care – PPO | Source: Ambulatory Visit | Attending: Obstetrics and Gynecology | Admitting: Obstetrics and Gynecology

## 2014-06-05 DIAGNOSIS — R921 Mammographic calcification found on diagnostic imaging of breast: Secondary | ICD-10-CM

## 2014-06-11 ENCOUNTER — Other Ambulatory Visit: Payer: Self-pay | Admitting: *Deleted

## 2014-06-11 MED ORDER — FLUTICASONE-SALMETEROL 250-50 MCG/DOSE IN AEPB: 1.0000 | INHALATION_SPRAY | Freq: Two times a day (BID) | RESPIRATORY_TRACT | Status: DC

## 2014-06-12 ENCOUNTER — Ambulatory Visit (INDEPENDENT_AMBULATORY_CARE_PROVIDER_SITE_OTHER): Payer: Medicare Other

## 2014-06-12 DIAGNOSIS — Z23 Encounter for immunization: Secondary | ICD-10-CM

## 2014-06-18 ENCOUNTER — Other Ambulatory Visit: Payer: Self-pay | Admitting: Ophthalmology

## 2014-06-18 MED ORDER — TETRACAINE HCL 0.5 % OP SOLN: 1.0000 [drp] | OPHTHALMIC | Status: DC

## 2014-06-18 NOTE — H&P (Signed)
History & Physical:   DATE:   06-16-14  NAME:  [name], [name]     [id number]       HISTORY OF PRESENT ILLNESS: Chief Eye Complaints glaucoma , difficulty  driving reading street signs   Patient here for a PreOP for Cataract Surgery. No pain or floaters. Va has decrease since last visit.      LOCATION: OD     QUALITY/COURSE:   Reports condition is worsening.        INTENSITY/SEVERITY:    Reports measurement ( or degree) as severe .      DURATION:   Reports the general length of symptoms to be years.     ACTIVE PROBLEMS: Pseudophakia   ICD10: Z96.1 ICD9: 446.0 Onset: 06/16/2014 14:14  OS  Primary open angle glaucoma   ICD9: 365.11  Onset:   ICD10:   Chronic angle-closure glaucoma   ICD10: H40.229  ICD9: 365.23  Onset: 06/16/2014 15:49  mixed mechanism   Nuclear cataract NOS   ICD9: 366.04  Onset:   ICD10:  OD  SURGERIES: Pseudophakia   ICD10: Z96.1 ICD9: 446.0 Onset: 06/16/2014 14:14  OS  Pick List - Surgeries  MEDICATIONS: Zioptan: 0.0015% solution SIG-   1 gtt OU QHS  REVIEW OF SYSTEMS: ROS:   GEN- Constitutional: HENT: GEN - Endocrine: Reports symptoms of LUNGS/Respiratory: asthma HEART/Cardiovascular: Reports symptoms of ABD/Gastrointestinal:   Musculoskeletal (BJE): NEURO/Neurological: PSYCH/Psychiatric:    Is the pt oriented to time, place, person? yes  Mood  normal   TOBACCO:  Never smoker   ICD10: Z87.898 ICD9: V13.89 Onset: 06/16/2014 14:10    SOCIAL HISTORY: ATTORNEY  FAMILY HISTORY: Positive family history for  -  Glaucoma/Diabetes/Htn Negative family history for  -   PARENTS:  father diabetes,,mother glaucoma/HTN Family History - 1st Degree Relatives:  Daughter alive and well.   Family History - 1st Degree Relatives:  ALLERGIES:Sulfa Topicals Severity-  Onset-  Reaction-  Status- Active Type- Drug allergy Date Changed-       PHYSICAL EXAMINATION: VS: BMI: 56.4.  BP: 137/87.  H: 50.00 in.  P: 67 /min.  W: 200lbs 0oz.    Va      OD:CC 20/200 ph cc 20/80 OS:CC 20/30  ph  20/25       EYEGLASSES:  OD: -2.75 +1.25 x001   OS: -1.50 +1.00 x100 ADD:+2.50  MR 06/16/2014 14:30   OD: -3.25 +1.25 x001 20/200 OS: -2.00 +1.00 x100 20/25 ADD:  K's OD: 43.75 44.50  OS: 44.25 46.50  VF:   OD: full in all four quadrants OS: full in all four quadrants  Motility   PUPILS:  EYELIDS & OCULAR ADNEXA  SLE: Conjunctiva: Quiet with superior scars OS  Cornea:   Decreased Tear Break-Up Time w 1-2 staining inthopigment and a few guttae Arcus OU   Anterior Chamber:deep and Quiet    Iris:Brown with open  PERIPHERAL  IRIDECTOMY   OU  Lens: OD:3+  nuclear  sclerosis   AX:KPVVZSMOL chamber  intraocular lens implant  with capture   Vitreous  CCT  Ta   in mmHg OD:23  OS: 17          Time: 06/16/2014 14:56    Gonio OD open to mid TM rest of angle closed , OS several ares close    Dilation  Fundus:  optic nerve   OD:60% cupping  OS:no view   Macula:       OD:                                                     OS:   Vessels:  Periphery:   Humphery visual field  OD superior arcuate no worse than last test     OS nasal step     Exam: GENERAL: Appearance: HEAD, EARS, NOSE AND THROAT: Ears-Nose (external) Inspection: Externally, nose and ears are normal in appearance and without scars, lesions, or nodules.      Hearing assessment shows no problems with normal conversation.      LUNGS and RESPIRATORY: Lung auscultation elicits no wheezing, rhonci, rales or rubs and with equal breath sounds.    Respiratory effort described as breathing is unlabored and chest movement is symmetrical.    HEART (Cardiovascular): Heart auscultation discovers regular rate and rhythm; no murmur, gallop or rub. Normal heart sounds.    ABDOMEN (Gastrointestinal): Mass/Tenderness Exam: Neither are present.     MUSCULOSKELETAL (BJE): Inspection-Palpation: No major bone, joint,  tendon, or muscle changes.      NEUROLOGICAL: Alert and oriented. No major deficits of coordination or sensation.      PSYCHIATRIC: Insight and judgment appear  both to be intact and appropriate.    Mood and affect are described as normal mood and full affect.    SKIN: Skin Inspection: No rashes or lesions  ADMITTING DIAGNOSIS: Pseudophakia   ICD10: Z96.1 ICD9: 446.0 Onset: 06/16/2014 14:14  OS  Primary open angle glaucoma   ICD9: 365.11   Chronic angle-closure glaucoma   ICD10: H40.229  ICD9: 365.23  Onset: 06/16/2014 15:49  Initial Date:   mixed mechanism   Nuclear cataract NOS   ICD9: 366.04  OD  SURGICAL TREATMENT PLAN: prolensa preop  recheck IOP if > 16-18 may need  trabeculectomy  OD  restart Zioptan pf  Visual Fields  now   phaco emulsion cataract extractionw  intraocular lens implant  & anterior chamber deeping   OD    Risk and benefits of surgery have been reviewed with the patient and the patient agrees to proceed with the surgical procedure.    Actions:     Handouts: glaucoma , what is glaucoma?, New Handout, glaucoma treatment, What is a cataract?.    ___________________________ Marylynn Pearson, Brooke Bonito Starter - Inactive Problems:

## 2014-06-23 ENCOUNTER — Encounter (HOSPITAL_COMMUNITY): Payer: Self-pay | Admitting: *Deleted

## 2014-06-23 ENCOUNTER — Encounter (HOSPITAL_COMMUNITY): Payer: Self-pay | Admitting: Pharmacy Technician

## 2014-06-23 MED ORDER — TROPICAMIDE 1 % OP SOLN
1.0000 [drp] | OPHTHALMIC | Status: AC
  Administered 2014-06-24: 1 [drp] via OPHTHALMIC
  Filled 2014-06-23: qty 3

## 2014-06-23 MED ORDER — GATIFLOXACIN 0.5 % OP SOLN
1.0000 [drp] | OPHTHALMIC | Status: AC | PRN
  Administered 2014-06-24 (×3): 1 [drp] via OPHTHALMIC
  Filled 2014-06-23: qty 2.5

## 2014-06-23 MED ORDER — KETOROLAC TROMETHAMINE 0.5 % OP SOLN
1.0000 [drp] | OPHTHALMIC | Status: AC
Start: 2014-06-24 — End: 2014-06-24
  Administered 2014-06-24: 1 [drp] via OPHTHALMIC
  Filled 2014-06-23: qty 5

## 2014-06-23 MED ORDER — CYCLOPENTOLATE HCL 1 % OP SOLN
1.0000 [drp] | OPHTHALMIC | Status: AC
  Administered 2014-06-24: 1 [drp] via OPHTHALMIC
  Filled 2014-06-23: qty 2

## 2014-06-23 MED ORDER — PHENYLEPHRINE HCL 2.5 % OP SOLN
1.0000 [drp] | OPHTHALMIC | Status: AC
  Administered 2014-06-24: 1 [drp] via OPHTHALMIC
  Filled 2014-06-23: qty 2

## 2014-06-24 ENCOUNTER — Ambulatory Visit (HOSPITAL_COMMUNITY): Payer: BC Managed Care – PPO | Admitting: Anesthesiology

## 2014-06-24 ENCOUNTER — Encounter (HOSPITAL_COMMUNITY): Payer: Self-pay | Admitting: *Deleted

## 2014-06-24 ENCOUNTER — Encounter (HOSPITAL_COMMUNITY)
Admission: RE | Disposition: A | Discharge status: Home / Self Care | Payer: Self-pay | Source: Ambulatory Visit | Attending: Ophthalmology

## 2014-06-24 ENCOUNTER — Ambulatory Visit (HOSPITAL_COMMUNITY)
Admission: RE | Admit: 2014-06-24 | Discharge: 2014-06-24 | Disposition: A | Discharge status: Home / Self Care | Payer: BC Managed Care – PPO | Source: Ambulatory Visit | Attending: Ophthalmology | Admitting: Ophthalmology

## 2014-06-24 ENCOUNTER — Encounter (HOSPITAL_COMMUNITY): Payer: BC Managed Care – PPO | Admitting: Anesthesiology

## 2014-06-24 DIAGNOSIS — M549 Dorsalgia, unspecified: Secondary | ICD-10-CM | POA: Diagnosis not present

## 2014-06-24 DIAGNOSIS — K219 Gastro-esophageal reflux disease without esophagitis: Secondary | ICD-10-CM | POA: Insufficient documentation

## 2014-06-24 DIAGNOSIS — D869 Sarcoidosis, unspecified: Secondary | ICD-10-CM | POA: Insufficient documentation

## 2014-06-24 DIAGNOSIS — G8929 Other chronic pain: Secondary | ICD-10-CM | POA: Diagnosis not present

## 2014-06-24 DIAGNOSIS — Z961 Presence of intraocular lens: Secondary | ICD-10-CM | POA: Insufficient documentation

## 2014-06-24 DIAGNOSIS — H269 Unspecified cataract: Secondary | ICD-10-CM | POA: Insufficient documentation

## 2014-06-24 DIAGNOSIS — H409 Unspecified glaucoma: Secondary | ICD-10-CM | POA: Diagnosis not present

## 2014-06-24 DIAGNOSIS — J45909 Unspecified asthma, uncomplicated: Secondary | ICD-10-CM | POA: Insufficient documentation

## 2014-06-24 HISTORY — PX: CATARACT EXTRACTION W/PHACO: SHX586

## 2014-06-24 HISTORY — DX: Unspecified glaucoma: H40.9

## 2014-06-24 LAB — CBC
HCT: 39 % (ref 36.0–46.0)
Hemoglobin: 12.6 g/dL (ref 12.0–15.0)
MCH: 30.1 pg (ref 26.0–34.0)
MCHC: 32.3 g/dL (ref 30.0–36.0)
MCV: 93.3 fL (ref 78.0–100.0)
PLATELETS: 309 10*3/uL (ref 150–400)
RBC: 4.18 MIL/uL (ref 3.87–5.11)
RDW: 15.8 % — ABNORMAL HIGH (ref 11.5–15.5)
WBC: 6.4 10*3/uL (ref 4.0–10.5)

## 2014-06-24 LAB — BASIC METABOLIC PANEL
ANION GAP: 12 (ref 5–15)
BUN: 12 mg/dL (ref 6–23)
CALCIUM: 8.8 mg/dL (ref 8.4–10.5)
CO2: 25 mEq/L (ref 19–32)
CREATININE: 0.73 mg/dL (ref 0.50–1.10)
Chloride: 104 mEq/L (ref 96–112)
GFR calc Af Amer: >90 mL/min (ref >90)
GFR, EST NON AFRICAN AMERICAN: 88 mL/min — AB (ref >90)
GLUCOSE: 107 mg/dL — AB (ref 70–99)
Potassium: 4 mEq/L (ref 3.7–5.3)
Sodium: 141 mEq/L (ref 137–147)

## 2014-06-24 SURGERY — PHACOEMULSIFICATION, CATARACT, WITH IOL INSERTION
Anesthesia: Monitor Anesthesia Care | Site: Eye | Laterality: Right

## 2014-06-24 MED ORDER — LIDOCAINE HCL (CARDIAC) 20 MG/ML IV SOLN
INTRAVENOUS | Status: AC
  Filled 2014-06-24: qty 5

## 2014-06-24 MED ORDER — TRIAMCINOLONE ACETONIDE 40 MG/ML IJ SUSP
INTRAMUSCULAR | Status: AC
  Filled 2014-06-24: qty 5

## 2014-06-24 MED ORDER — NA CHONDROIT SULF-NA HYALURON 40-30 MG/ML IO SOLN
INTRAOCULAR | Status: DC | PRN
  Administered 2014-06-24: 0.5 mL via INTRAOCULAR

## 2014-06-24 MED ORDER — SODIUM HYALURONATE 10 MG/ML IO SOLN
INTRAOCULAR | Status: AC
  Filled 2014-06-24: qty 0.85

## 2014-06-24 MED ORDER — BSS IO SOLN
INTRAOCULAR | Status: AC
  Filled 2014-06-24: qty 500

## 2014-06-24 MED ORDER — PROPOFOL 10 MG/ML IV BOLUS
INTRAVENOUS | Status: DC | PRN
  Administered 2014-06-24: 50 mg via INTRAVENOUS

## 2014-06-24 MED ORDER — FENTANYL CITRATE 0.05 MG/ML IJ SOLN
INTRAMUSCULAR | Status: AC
  Filled 2014-06-24: qty 5

## 2014-06-24 MED ORDER — ACETYLCHOLINE CHLORIDE 1:100 IO SOLR
INTRAOCULAR | Status: DC | PRN
  Administered 2014-06-24: 20 mg via INTRAOCULAR

## 2014-06-24 MED ORDER — SODIUM HYALURONATE 10 MG/ML IO SOLN
INTRAOCULAR | Status: DC | PRN
  Administered 2014-06-24 (×2): 0.85 mL via INTRAOCULAR

## 2014-06-24 MED ORDER — EPINEPHRINE HCL 1 MG/ML IJ SOLN
INTRAOCULAR | Status: DC | PRN
  Administered 2014-06-24: 12:00:00

## 2014-06-24 MED ORDER — BUPIVACAINE HCL (PF) 0.75 % IJ SOLN
INTRAMUSCULAR | Status: AC
  Filled 2014-06-24: qty 10

## 2014-06-24 MED ORDER — PILOCARPINE HCL 4 % OP SOLN
OPHTHALMIC | Status: AC
  Filled 2014-06-24: qty 15

## 2014-06-24 MED ORDER — PROPOFOL 10 MG/ML IV BOLUS
INTRAVENOUS | Status: AC
  Filled 2014-06-24: qty 20

## 2014-06-24 MED ORDER — TETRACAINE HCL 0.5 % OP SOLN
OPHTHALMIC | Status: AC
  Filled 2014-06-24: qty 2

## 2014-06-24 MED ORDER — HYALURONIDASE HUMAN 150 UNIT/ML IJ SOLN
INTRAMUSCULAR | Status: AC
  Filled 2014-06-24: qty 1

## 2014-06-24 MED ORDER — TRIAMCINOLONE ACETONIDE 40 MG/ML IJ SUSP
INTRAMUSCULAR | Status: DC | PRN
  Administered 2014-06-24: .1 mL

## 2014-06-24 MED ORDER — TROPICAMIDE 1 % OP SOLN
1.0000 [drp] | OPHTHALMIC | Status: AC
  Administered 2014-06-24 (×2): 1 [drp] via OPHTHALMIC

## 2014-06-24 MED ORDER — EPINEPHRINE HCL 1 MG/ML IJ SOLN
INTRAMUSCULAR | Status: AC
  Filled 2014-06-24: qty 1

## 2014-06-24 MED ORDER — LIDOCAINE HCL 2 % IJ SOLN
INTRAMUSCULAR | Status: AC
  Filled 2014-06-24: qty 20

## 2014-06-24 MED ORDER — NA CHONDROIT SULF-NA HYALURON 40-30 MG/ML IO SOLN
INTRAOCULAR | Status: AC
  Filled 2014-06-24: qty 0.5

## 2014-06-24 MED ORDER — MITOMYCIN 0.2 MG OP KIT
0.2000 mg | PACK | Freq: Once | OPHTHALMIC | Status: DC
  Filled 2014-06-24: qty 1

## 2014-06-24 MED ORDER — LIDOCAINE HCL (CARDIAC) 20 MG/ML IV SOLN
INTRAVENOUS | Status: DC | PRN
  Administered 2014-06-24: 30 mg via INTRAVENOUS

## 2014-06-24 MED ORDER — CYCLOPENTOLATE HCL 1 % OP SOLN
1.0000 [drp] | OPHTHALMIC | Status: AC
  Administered 2014-06-24 (×2): 1 [drp] via OPHTHALMIC

## 2014-06-24 MED ORDER — TOBRAMYCIN-DEXAMETHASONE 0.3-0.1 % OP OINT
TOPICAL_OINTMENT | OPHTHALMIC | Status: AC
  Filled 2014-06-24: qty 3.5

## 2014-06-24 MED ORDER — SODIUM CHLORIDE 0.9 % IV SOLN
INTRAVENOUS | Status: DC
  Administered 2014-06-24 (×2): via INTRAVENOUS

## 2014-06-24 MED ORDER — ATROPINE SULFATE 1 % OP SOLN
OPHTHALMIC | Status: AC
  Filled 2014-06-24: qty 2

## 2014-06-24 MED ORDER — MIDAZOLAM HCL 5 MG/5ML IJ SOLN
INTRAMUSCULAR | Status: DC | PRN
  Administered 2014-06-24: 2 mg via INTRAVENOUS

## 2014-06-24 MED ORDER — LIDOCAINE-EPINEPHRINE 2 %-1:100000 IJ SOLN
INTRAMUSCULAR | Status: AC
  Filled 2014-06-24: qty 1

## 2014-06-24 MED ORDER — 0.9 % SODIUM CHLORIDE (POUR BTL) OPTIME
TOPICAL | Status: DC | PRN
  Administered 2014-06-24: 1000 mL

## 2014-06-24 MED ORDER — ACETYLCHOLINE CHLORIDE 1:100 IO SOLR
INTRAOCULAR | Status: AC
  Filled 2014-06-24: qty 1

## 2014-06-24 MED ORDER — KETOROLAC TROMETHAMINE 0.5 % OP SOLN
1.0000 [drp] | OPHTHALMIC | Status: AC
  Administered 2014-06-24 (×2): 1 [drp] via OPHTHALMIC

## 2014-06-24 MED ORDER — TOBRAMYCIN 0.3 % OP OINT
TOPICAL_OINTMENT | OPHTHALMIC | Status: DC | PRN
  Administered 2014-06-24: 1 via OPHTHALMIC

## 2014-06-24 MED ORDER — LIDOCAINE-EPINEPHRINE 2 %-1:100000 IJ SOLN
INTRAMUSCULAR | Status: DC | PRN
  Administered 2014-06-24: 12:00:00 via RETROBULBAR

## 2014-06-24 MED ORDER — BSS IO SOLN
INTRAOCULAR | Status: AC
  Filled 2014-06-24: qty 15

## 2014-06-24 MED ORDER — MIDAZOLAM HCL 2 MG/2ML IJ SOLN
INTRAMUSCULAR | Status: AC
  Filled 2014-06-24: qty 2

## 2014-06-24 MED ORDER — PHENYLEPHRINE HCL 2.5 % OP SOLN
1.0000 [drp] | OPHTHALMIC | Status: AC
  Administered 2014-06-24 (×2): 1 [drp] via OPHTHALMIC

## 2014-06-24 SURGICAL SUPPLY — 55 items
APL SRG 3 HI ABS STRL LF PLS (MISCELLANEOUS)
APPLICATOR COTTON TIP 6IN STRL (MISCELLANEOUS) ×3 IMPLANT
APPLICATOR DR MATTHEWS STRL (MISCELLANEOUS) ×1 IMPLANT
BLADE EYE CATARACT 19 1.4 BEAV (BLADE) ×2 IMPLANT
BLADE KERATOME 2.75 (BLADE) ×3 IMPLANT
BLADE MINI RND TIP GREEN BEAV (BLADE) ×2 IMPLANT
BLADE STAB KNIFE 45DEG (BLADE) ×3 IMPLANT
CANISTER SUCTION 2500CC (MISCELLANEOUS) IMPLANT
CANNULA ANTERIOR CHAMBER 27GA (MISCELLANEOUS) ×3 IMPLANT
CORDS BIPOLAR (ELECTRODE) ×3 IMPLANT
COVER MAYO STAND STRL (DRAPES) ×3 IMPLANT
DRAPE OPHTHALMIC 40X48 W POUCH (DRAPES) ×3 IMPLANT
DRAPE RETRACTOR (MISCELLANEOUS) ×3 IMPLANT
ERASER HMR WETFIELD 23G BP (MISCELLANEOUS) ×2 IMPLANT
GLOVE BIO SURGEON STRL SZ7.5 (GLOVE) ×2 IMPLANT
GLOVE BIO SURGEON STRL SZ8 (GLOVE) ×3 IMPLANT
GLOVE BIOGEL PI IND STRL 7.0 (GLOVE) ×1 IMPLANT
GLOVE BIOGEL PI INDICATOR 7.0 (GLOVE) ×1
GLOVE SURG SS PI 7.0 STRL IVOR (GLOVE) ×2 IMPLANT
GOWN STRL REUS W/ TWL LRG LVL3 (GOWN DISPOSABLE) ×5 IMPLANT
GOWN STRL REUS W/ TWL XL LVL3 (GOWN DISPOSABLE) ×2 IMPLANT
GOWN STRL REUS W/TWL LRG LVL3 (GOWN DISPOSABLE) ×9
GOWN STRL REUS W/TWL XL LVL3 (GOWN DISPOSABLE) ×3
KIT BASIN OR (CUSTOM PROCEDURE TRAY) ×3 IMPLANT
KIT ROOM TURNOVER OR (KITS) ×3 IMPLANT
KNIFE GRIESHABER SHARP 2.5MM (MISCELLANEOUS) ×3 IMPLANT
LENS IOL ACRSF IQ PC 24.5 (Intraocular Lens) ×1 IMPLANT
LENS IOL ACRYSOF IQ POST 24.5 (Intraocular Lens) ×3 IMPLANT
NDL 18GX1X1/2 (RX/OR ONLY) (NEEDLE) ×1 IMPLANT
NDL 25GX 5/8IN NON SAFETY (NEEDLE) ×1 IMPLANT
NDL FILTER BLUNT 18X1 1/2 (NEEDLE) ×1 IMPLANT
NDL HYPO 30X.5 LL (NEEDLE) ×1 IMPLANT
NEEDLE 18GX1X1/2 (RX/OR ONLY) (NEEDLE) ×3 IMPLANT
NEEDLE 25GX 5/8IN NON SAFETY (NEEDLE) ×3 IMPLANT
NEEDLE FILTER BLUNT 18X 1/2SAF (NEEDLE) ×1
NEEDLE FILTER BLUNT 18X1 1/2 (NEEDLE) ×2 IMPLANT
NEEDLE HYPO 30X.5 LL (NEEDLE) ×3 IMPLANT
NS IRRIG 1000ML POUR BTL (IV SOLUTION) ×3 IMPLANT
PACK CATARACT CUSTOM (CUSTOM PROCEDURE TRAY) ×3 IMPLANT
PAD ARMBOARD 7.5X6 YLW CONV (MISCELLANEOUS) ×6 IMPLANT
PAK PIK CVS CATARACT (OPHTHALMIC) ×3 IMPLANT
SPEAR EYE SURG WECK-CEL (MISCELLANEOUS) ×6 IMPLANT
SPECIMEN JAR SMALL (MISCELLANEOUS) IMPLANT
SUT ETHILON 10 0 CS140 6 (SUTURE) ×3 IMPLANT
SUT ETHILON 9 0 BV100 4 (SUTURE) ×3 IMPLANT
SUT SILK 6 0 G 6 (SUTURE) ×3 IMPLANT
SUT VICRYL 9-0 (SUTURE) IMPLANT
SYR 50ML SLIP (SYRINGE) ×3 IMPLANT
SYR TB 1ML LUER SLIP (SYRINGE) ×3 IMPLANT
TIP ABS 45DEG FLARED 0.9MM (TIP) ×3 IMPLANT
TOWEL OR 17X24 6PK STRL BLUE (TOWEL DISPOSABLE) ×4 IMPLANT
TOWEL OR 17X26 10 PK STRL BLUE (TOWEL DISPOSABLE) ×3 IMPLANT
TUBE CONNECTING 12X1/4 (SUCTIONS) ×2 IMPLANT
WATER STERILE IRR 1000ML POUR (IV SOLUTION) ×3 IMPLANT
WIPE INSTRUMENT VISIWIPE 73X73 (MISCELLANEOUS) ×4 IMPLANT

## 2014-06-24 NOTE — Anesthesia Preprocedure Evaluation (Addendum)
Anesthesia Evaluation  Patient identified by MRN, date of birth, ID band Patient awake    Reviewed: Allergy & Precautions, H&P , NPO status , Patient's Chart, lab work & pertinent test results  History of Anesthesia Complications Negative for: history of anesthetic complications  Airway Mallampati: II TM Distance: >3 FB Neck ROM: Limited    Dental  (+) Dental Advisory Given   Pulmonary asthma (daily inhalers) ,  sarcoidosis breath sounds clear to auscultation        Cardiovascular - anginanegative cardio ROS  Rhythm:Regular Rate:Normal     Neuro/Psych Chronic back pain: narcotics    GI/Hepatic negative GI ROS, Neg liver ROS, GERD-  Medicated and Controlled,  Endo/Other  Morbid obesity  Renal/GU negative Renal ROS     Musculoskeletal   Abdominal (+) + obese,   Peds  Hematology negative hematology ROS (+)   Anesthesia Other Findings   Reproductive/Obstetrics                          Anesthesia Physical Anesthesia Plan  ASA: II  Anesthesia Plan: MAC   Post-op Pain Management:    Induction:   Airway Management Planned: Natural Airway  Additional Equipment:   Intra-op Plan:   Post-operative Plan:   Informed Consent: I have reviewed the patients History and Physical, chart, labs and discussed the procedure including the risks, benefits and alternatives for the proposed anesthesia with the patient or authorized representative who has indicated his/her understanding and acceptance.   Dental advisory given  Plan Discussed with: Surgeon and CRNA  Anesthesia Plan Comments: (Plan routine monitors, MAC)        Anesthesia Quick Evaluation

## 2014-06-24 NOTE — Transfer of Care (Signed)
Immediate Anesthesia Transfer of Care Note  Patient: [name]  Procedure(s) Performed: Procedure(s): CATARACT EXTRACTION PHACO AND INTRAOCULAR LENS PLACEMENT (IOC) RIGHT EYE WITH GONIOSYNECHIALYSIS (Right)  Patient Location: PACU  Anesthesia Type:MAC  Level of Consciousness: awake, alert  and oriented  Airway & Oxygen Therapy: Patient Spontanous Breathing  Post-op Assessment: Report given to PACU RN and Post -op Vital signs reviewed and stable  Post vital signs: Reviewed and stable  Complications: No apparent anesthesia complications

## 2014-06-24 NOTE — Anesthesia Postprocedure Evaluation (Signed)
  Anesthesia Post-op Note  Patient: [name]  Procedure(s) Performed: Procedure(s) (LRB): CATARACT EXTRACTION PHACO AND INTRAOCULAR LENS PLACEMENT (IOC) RIGHT EYE WITH GONIOSYNECHIALYSIS (Right)  Patient Location: PACU  Anesthesia Type: MAC  Level of Consciousness: awake and alert   Airway and Oxygen Therapy: Patient Spontanous Breathing  Post-op Pain: mild  Post-op Assessment: Post-op Vital signs reviewed, Patient's Cardiovascular Status Stable, Respiratory Function Stable, Patent Airway and No signs of Nausea or vomiting  Last Vitals:  Filed Vitals:   06/24/14 1315  BP: 152/72  Pulse: 86  Temp: 36.5 C  Resp: 15    Post-op Vital Signs: stable   Complications: No apparent anesthesia complications

## 2014-06-24 NOTE — Interval H&P Note (Signed)
History and Physical Interval Note:  06/24/2014 11:13 AM  [name]  has presented today for surgery, with the diagnosis of UNCONTROLLED GLAUCOMA AND CATARACT OD  The various methods of treatment have been discussed with the patient and family. After consideration of risks, benefits and other options for treatment, the patient has consented to  Procedure(s): CATARACT EXTRACTION PHACO AND INTRAOCULAR LENS PLACEMENT (IOC) AND GLAUCOMA SURGERY RIGHT EYE (Right) TRABECULECTOMY W MITOSOL (Right) as a surgical intervention .  The patient's history has been reviewed, patient examined, no change in status, stable for surgery.  I have reviewed the patient's chart and labs.  Questions were answered to the patient's satisfaction.     Rudy Domek

## 2014-06-24 NOTE — Discharge Instructions (Addendum)
What to eat:  For your first meals, you should eat lightly; only small meals initially.  If you do not have nausea, you may eat larger meals.  Avoid spicy, greasy and heavy food.    General Anesthesia, Adult, Care After  Refer to this sheet in the next few weeks. These instructions provide you with information on caring for yourself after your procedure. Your health care provider may also give you more specific instructions. Your treatment has been planned according to current medical practices, but problems sometimes occur. Call your health care provider if you have any problems or questions after your procedure.  WHAT TO EXPECT AFTER THE PROCEDURE  After the procedure, it is typical to experience:  Sleepiness.  Nausea and vomiting. HOME CARE INSTRUCTIONS  For the first 24 hours after general anesthesia:  Have a responsible person with you.  Do not drive a car. If you are alone, do not take public transportation.  Do not drink alcohol.  Do not take medicine that has not been prescribed by your health care provider.  Do not sign important papers or make important decisions.  You may resume a normal diet and activities as directed by your health care provider.  Change bandages (dressings) as directed.  If you have questions or problems that seem related to general anesthesia, call the hospital and ask for the anesthetist or anesthesiologist on call. SEEK MEDICAL CARE IF:  You have nausea and vomiting that continue the day after anesthesia.  You develop a rash. SEEK IMMEDIATE MEDICAL CARE IF:  You have difficulty breathing.  You have chest pain.  You have any allergic problems. Document Released: 12/25/2000 Document Revised: 05/21/2013 Document Reviewed: 04/03/2013  [facility] Patient Information 2014 [place], [place].   The patient may remove the eye patch today at 4:00 PM. She should instill the eye drops even to her at the office as well as her glaucoma drops. The patient should sleep  with the eye shield the plastic eye she'll on her eye she should avoid heavy lifting bending and straining. Patient should sleep on her back or the left side.

## 2014-06-24 NOTE — H&P (View-Only) (Signed)
History & Physical:   DATE:   06-16-14  NAME:  [name], [name]     [id number]       HISTORY OF PRESENT ILLNESS: Chief Eye Complaints glaucoma , difficulty  driving reading street signs   Patient here for a PreOP for Cataract Surgery. No pain or floaters. Va has decrease since last visit.      LOCATION: OD     QUALITY/COURSE:   Reports condition is worsening.        INTENSITY/SEVERITY:    Reports measurement ( or degree) as severe .      DURATION:   Reports the general length of symptoms to be years.     ACTIVE PROBLEMS: Pseudophakia   ICD10: Z96.1 ICD9: 446.0 Onset: 06/16/2014 14:14  OS  Primary open angle glaucoma   ICD9: 365.11  Onset:   ICD10:   Chronic angle-closure glaucoma   ICD10: H40.229  ICD9: 365.23  Onset: 06/16/2014 15:49  mixed mechanism   Nuclear cataract NOS   ICD9: 366.04  Onset:   ICD10:  OD  SURGERIES: Pseudophakia   ICD10: Z96.1 ICD9: 446.0 Onset: 06/16/2014 14:14  OS  Pick List - Surgeries  MEDICATIONS: Zioptan: 0.0015% solution SIG-   1 gtt OU QHS  REVIEW OF SYSTEMS: ROS:   GEN- Constitutional: HENT: GEN - Endocrine: Reports symptoms of LUNGS/Respiratory: asthma HEART/Cardiovascular: Reports symptoms of ABD/Gastrointestinal:   Musculoskeletal (BJE): NEURO/Neurological: PSYCH/Psychiatric:    Is the pt oriented to time, place, person? yes  Mood  normal   TOBACCO:  Never smoker   ICD10: Z87.898 ICD9: V13.89 Onset: 06/16/2014 14:10    SOCIAL HISTORY: ATTORNEY  FAMILY HISTORY: Positive family history for  -  Glaucoma/Diabetes/Htn Negative family history for  -   PARENTS:  father diabetes,,mother glaucoma/HTN Family History - 1st Degree Relatives:  Daughter alive and well.   Family History - 1st Degree Relatives:  ALLERGIES:Sulfa Topicals Severity-  Onset-  Reaction-  Status- Active Type- Drug allergy Date Changed-       PHYSICAL EXAMINATION: VS: BMI: 56.4.  BP: 137/87.  H: 50.00 in.  P: 67 /min.  W: 200lbs 0oz.    Va      OD:CC 20/200 ph cc 20/80 OS:CC 20/30  ph  20/25       EYEGLASSES:  OD: -2.75 +1.25 x001   OS: -1.50 +1.00 x100 ADD:+2.50  MR 06/16/2014 14:30   OD: -3.25 +1.25 x001 20/200 OS: -2.00 +1.00 x100 20/25 ADD:  K's OD: 43.75 44.50  OS: 44.25 46.50  VF:   OD: full in all four quadrants OS: full in all four quadrants  Motility   PUPILS:  EYELIDS & OCULAR ADNEXA  SLE: Conjunctiva: Quiet with superior scars OS  Cornea:   Decreased Tear Break-Up Time w 1-2 staining inthopigment and a few guttae Arcus OU   Anterior Chamber:deep and Quiet    Iris:Brown with open  PERIPHERAL  IRIDECTOMY   OU  Lens: OD:3+  nuclear  sclerosis   XT:GGYIRSWNI chamber  intraocular lens implant  with capture   Vitreous  CCT  Ta   in mmHg OD:23  OS: 17          Time: 06/16/2014 14:56    Gonio OD open to mid TM rest of angle closed , OS several ares close    Dilation  Fundus:  optic nerve   OD:60% cupping  OS:no view   Macula:       OD:                                                     OS:   Vessels:  Periphery:   Humphery visual field  OD superior arcuate no worse than last test     OS nasal step     Exam: GENERAL: Appearance: HEAD, EARS, NOSE AND THROAT: Ears-Nose (external) Inspection: Externally, nose and ears are normal in appearance and without scars, lesions, or nodules.      Hearing assessment shows no problems with normal conversation.      LUNGS and RESPIRATORY: Lung auscultation elicits no wheezing, rhonci, rales or rubs and with equal breath sounds.    Respiratory effort described as breathing is unlabored and chest movement is symmetrical.    HEART (Cardiovascular): Heart auscultation discovers regular rate and rhythm; no murmur, gallop or rub. Normal heart sounds.    ABDOMEN (Gastrointestinal): Mass/Tenderness Exam: Neither are present.     MUSCULOSKELETAL (BJE): Inspection-Palpation: No major bone, joint,  tendon, or muscle changes.      NEUROLOGICAL: Alert and oriented. No major deficits of coordination or sensation.      PSYCHIATRIC: Insight and judgment appear  both to be intact and appropriate.    Mood and affect are described as normal mood and full affect.    SKIN: Skin Inspection: No rashes or lesions  ADMITTING DIAGNOSIS: Pseudophakia   ICD10: Z96.1 ICD9: 446.0 Onset: 06/16/2014 14:14  OS  Primary open angle glaucoma   ICD9: 365.11   Chronic angle-closure glaucoma   ICD10: H40.229  ICD9: 365.23  Onset: 06/16/2014 15:49  Initial Date:   mixed mechanism   Nuclear cataract NOS   ICD9: 366.04  OD  SURGICAL TREATMENT PLAN: prolensa preop  recheck IOP if > 16-18 may need  trabeculectomy  OD  restart Zioptan pf  Visual Fields  now   phaco emulsion cataract extractionw  intraocular lens implant  & anterior chamber deeping   OD    Risk and benefits of surgery have been reviewed with the patient and the patient agrees to proceed with the surgical procedure.    Actions:     Handouts: glaucoma , what is glaucoma?, New Handout, glaucoma treatment, What is a cataract?.    ___________________________ Marylynn Pearson, Brooke Bonito Starter - Inactive Problems:

## 2014-06-24 NOTE — Op Note (Signed)
Preoperative diagnosis: Visually significant cataract and mixed mechanism open closed angle glaucoma right eye Postoperative diagnosis: Same Procedure: Phacoemulsified additional intraocular lens implant and goniosynechiolysis right eye Complications: None Assistant: MingLee Anesthesia: 2% Xylocaine with epinephrine in a 50-50 mixture 0.75% Marcaine with ample Wydase Procedure: The patient was escorted to the operating room where she was given a peribulbar block with the aforementioned local anesthetic agent following this the patient's face prepped and draped in the usual sterile fashion. With the surgeon sitting temporally the operating microscope in position a Weck-Cel sponge was used to fixate the globe and a 15 blade was used to enter through superior clear cornea Viscoat was injected in the anterior chamber. An additional Weck-Cel sponge was used to and a 2.75 mm keratome blade was used in a stepwise fashion through temporal clear cornea. A bent 25-gauge needle was used to incise anterior capsule the capsule was rather flaccid and the tearing did not occur in a curvilinear fashion it was necessary to reinject Viscoat. The capsule forceps was then used to continue the capsulorrhexis and the anterior capsule was removed from the eye. BSS was then used to hydrodissect and hydrodelineate the nucleus. The phacoemulsification unit was then used to remove the epinucleus the nucleus was then sculpted into 4 quadrants the quadrants were separated with the phacotip and the Kuglen hook all nuclear fragments were then aspirated from the eye and the posterior capsule remained intact the irrigation aspiration device was then used to strip cortical fibers from the posterior capsule after cortical fibers had been removed Provisc was injected in the eye the intraocular lens implant was examined and noted to have no defects. The lens was an Alcon AcrySof SN 60 WF 24.5 diopter IQ lens SN #[id number] the lens was  injected and rotated with a Kuglen hook the subincisional cortex was then removed Miochol was injected to constrict the pupil at this point additional Provisc was injected in the anterior chamber and a goniolens was placed on the eye with the patient's head positioned temporally the angle was examined and noted to be closed nasally therefore an iris spatula was used under direct visualization to depress the iris and opened the nasal angle. The spatula was removed from the eye and the I/A was again used to remove viscoelastic from the eye the pupil was noted to come down symmetrically around additional Miochol was injected in the eye. The incision was hydrated the eye was pressurized and there was no leakage therefore all instruments were removed from the eye topical TobraDex ointment was applied to the eye a patch and Fox U. were placed and the patient returned to recovery area in stable condition. Marylynn Pearson Junior M.D.

## 2014-06-29 ENCOUNTER — Encounter (HOSPITAL_COMMUNITY): Payer: Self-pay | Admitting: Ophthalmology

## 2014-10-08 ENCOUNTER — Encounter: Payer: Self-pay | Admitting: Internal Medicine

## 2015-02-15 ENCOUNTER — Ambulatory Visit (INDEPENDENT_AMBULATORY_CARE_PROVIDER_SITE_OTHER): Payer: Medicare Other | Admitting: Family Medicine

## 2015-02-15 ENCOUNTER — Encounter: Payer: Self-pay | Admitting: Family Medicine

## 2015-02-15 VITALS — BP 118/74 | HR 111 | Temp 98.4°F | Ht 60.0 in | Wt 224.6 lb

## 2015-02-15 DIAGNOSIS — J209 Acute bronchitis, unspecified: Secondary | ICD-10-CM | POA: Diagnosis not present

## 2015-02-15 MED ORDER — DOXYCYCLINE HYCLATE 100 MG PO CAPS: 100.0000 mg | ORAL_CAPSULE | Freq: Two times a day (BID) | ORAL | Status: DC

## 2015-02-15 NOTE — Progress Notes (Signed)
   Subjective:    Patient ID: [name], female    DOB: [birth year], 64 y.o.   MRN: [id number]  HPI  Acute visit. Patient is nonsmoker with history of asthma and reported remote history of sarcoidosis. She was seen with 2-3 day history of productive cough. Not aware of any wheezing. No fevers or chills. No dyspnea. Takes Advair regularly and rescue inhaler with albuterol. No sick exposures. Minimal nasal congestion. No sore throat.  Past Medical History  Diagnosis Date  . Acute bronchitis   . Pain in joint, ankle and foot   . Unspecified asthma(493.90)   . Backache, unspecified   . Other chest pain   . Diverticulosis of colon (without mention of hemorrhage)   . Lumbago   . Edema   . Sarcoidosis   . Tibialis tendinitis   . Obesity   . Glaucoma    Past Surgical History  Procedure Laterality Date  . Hand surgery    . Abdominal hysterectomy    . Eye surgery Left     cataract surgery  . Knee arthroscopy Right   . Colonoscopy    . Cataract extraction w/phaco Right 06/24/2014    Procedure: CATARACT EXTRACTION PHACO AND INTRAOCULAR LENS PLACEMENT (IOC) RIGHT EYE WITH GONIOSYNECHIALYSIS;  Surgeon: Marylynn Pearson, MD;  Location: [place];  Service: Ophthalmology;  Laterality: Right;    reports that she has never smoked. She has never used smokeless tobacco. She reports that she drinks about 0.5 oz of alcohol per week. She reports that she does not use illicit drugs. family history includes Cancer in her brother; Diabetes in her father; Hypertension in her mother; Sarcoidosis in her sister; Stroke in her mother. Allergies  Allergen Reactions  . Ibuprofen     rash     Review of Systems  Constitutional: Negative for fever and chills.  HENT: Negative for sore throat.   Respiratory: Positive for cough. Negative for shortness of breath and wheezing.   Cardiovascular: Negative for chest pain.       Objective:   Physical Exam  Constitutional: She appears well-developed and  well-nourished. No distress.  HENT:  Mouth/Throat: Oropharynx is clear and moist.  Moderate cerumen right canal. Left normal  Neck: Neck supple.  Cardiovascular: Normal rate and regular rhythm.   Pulmonary/Chest: Effort normal and breath sounds normal. No respiratory distress. She has no wheezes. She has no rales.          Assessment & Plan:  Cough. Suspect acute viral bronchitis. We recommend observation and treat symptomatically. If she develops any fever or persistent productive cough consider doxycycline. Continue regular use of Advair

## 2015-02-15 NOTE — Progress Notes (Signed)
Pre visit review using our clinic review tool, if applicable. No additional management support is needed unless otherwise documented below in the visit note. 

## 2015-02-15 NOTE — Patient Instructions (Signed)

## 2015-02-19 ENCOUNTER — Telehealth: Payer: Self-pay | Admitting: Internal Medicine

## 2015-02-19 NOTE — Telephone Encounter (Signed)
[facility] - Client [place] [facility] Patient Name: [name] DOB: 1951/08/15 Initial Comment Caller states she was seen on Monday- chills, and a fever. She is on antibiotic, and has diarrhea. Nurse Assessment Nurse: Markus Daft, RN, Sherre Poot Date/Time (Eastern Time): 02/19/2015 10:47:42 AM Confirm and document reason for call. If symptomatic, describe symptoms. ---Caller states she was seen on Monday by MD for wheezing, and chest and nasal congestion. S/S started about 3 wks. ago with wheezing and then Friday ( a week ago) with cold s/s. She was given Doxycycline HCL 100 mg BID and advised to take it if she started with fever. She kept having chills, and kept loosing her voice so she started the antibiotic on Tuesday. She did not measure to see if she had a fever. Started to also have diarrhea since Tuesday after starting antibiotics. She stopped the antibiotic Wednesday night (last dose). Last episode of diarrhea was Thursday AM. No chills right now. She still has hoarseness and a significant productive cough. Less nasal stuffiness. No wheezing currently. Used Advair inhaler BID. Has the patient traveled out of the country within the last 30 days? ---No Does the patient require triage? ---Yes Related visit to physician within the last 2 weeks? ---Yes Does the PT have any chronic conditions? (i.e. diabetes, asthma, etc.) ---Yes List chronic conditions. ---Asthma, bronchitis, sarcoidosis, seasonal allergies Guidelines Guideline Title Affirmed Question Affirmed Notes Asthma Attack [1] Wheezing or coughing AND [2] hasn't used neb or inhaler twice AND [3] it's available some difficulty breathing, and some chest discomfort, rates sore throat pain 3/10. Has not used her Albuterol inhaler. Asthma Attack [1] MILD asthma attack (e.g., no SOB at rest, mild SOB with walking, speaks normally in sentences, mild wheezing) AND [2]  persists > 24 hours on appropriate treatment Final Disposition User See Physician within [place], [place], [place] Comments F/U Call after breathing treatments: She states that she felt immediately better after taking  the back to back breathing treatments with her Albuterol inhaler. Denies CP, SOB. Caller states that she is on her way to work as she feels so much better since using her inhaler, and feels strongly that she doesn't need to be seen. She will keep up the advice given by the nurse, and call back if she worsens or it persists to be seen. RN noted and will send msg. to MD. RN advised caller that if MD feels differently, his staff will give her a call back today. Caller verb. understanding.

## 2015-02-19 NOTE — Telephone Encounter (Signed)
FYI. Per note, patient understands to call if respiratory symptoms worsen.

## 2015-04-12 ENCOUNTER — Other Ambulatory Visit: Payer: Self-pay | Admitting: Family Medicine

## 2015-06-14 ENCOUNTER — Encounter: Payer: Self-pay | Admitting: Internal Medicine

## 2015-06-14 ENCOUNTER — Ambulatory Visit (INDEPENDENT_AMBULATORY_CARE_PROVIDER_SITE_OTHER): Payer: Medicare Other | Admitting: Internal Medicine

## 2015-06-14 VITALS — BP 120/74 | HR 93 | Temp 98.4°F | Ht 60.0 in | Wt 223.0 lb

## 2015-06-14 DIAGNOSIS — D869 Sarcoidosis, unspecified: Secondary | ICD-10-CM | POA: Diagnosis not present

## 2015-06-14 DIAGNOSIS — J4520 Mild intermittent asthma, uncomplicated: Secondary | ICD-10-CM | POA: Diagnosis not present

## 2015-06-14 DIAGNOSIS — M25561 Pain in right knee: Secondary | ICD-10-CM | POA: Diagnosis not present

## 2015-06-14 DIAGNOSIS — Z23 Encounter for immunization: Secondary | ICD-10-CM | POA: Diagnosis not present

## 2015-06-14 MED ORDER — OMEPRAZOLE 20 MG PO CPDR: 20.0000 mg | DELAYED_RELEASE_CAPSULE | Freq: Every day | ORAL | Status: DC

## 2015-06-14 MED ORDER — FLUTICASONE-SALMETEROL 250-50 MCG/DOSE IN AEPB: 1.0000 | INHALATION_SPRAY | Freq: Two times a day (BID) | RESPIRATORY_TRACT | Status: DC

## 2015-06-14 MED ORDER — ALBUTEROL SULFATE HFA 108 (90 BASE) MCG/ACT IN AERS: 2.0000 | INHALATION_SPRAY | Freq: Four times a day (QID) | RESPIRATORY_TRACT | Status: DC | PRN

## 2015-06-14 MED ORDER — FUROSEMIDE 40 MG PO TABS: 40.0000 mg | ORAL_TABLET | Freq: Every day | ORAL | Status: DC

## 2015-06-14 MED ORDER — CELECOXIB 200 MG PO CAPS: 200.0000 mg | ORAL_CAPSULE | Freq: Two times a day (BID) | ORAL | Status: DC

## 2015-06-14 MED ORDER — CELECOXIB 200 MG PO CAPS: 200.0000 mg | ORAL_CAPSULE | Freq: Every day | ORAL | Status: DC

## 2015-06-14 NOTE — Progress Notes (Signed)
Subjective:    Patient ID: [name], female    DOB: 1951-05-21, 64 y.o.   MRN: [id number]  HPI  64 year old patient who is seen today after a one-year absence.  Complaints include the right knee pain.  She states she has had a prior arthroscopic procedure on the right knee.  She has chronic low back pain.  She has history mild asthma that has been stable and a history of sarcoidosis. She has exogenous obesity. Seen today for medication refill  Past Medical History  Diagnosis Date  . Acute bronchitis   . Pain in joint, ankle and foot   . Unspecified asthma(493.90)   . Backache, unspecified   . Other chest pain   . Diverticulosis of colon (without mention of hemorrhage)   . Lumbago   . Edema   . Sarcoidosis   . Tibialis tendinitis   . Obesity   . Glaucoma     Social History   Social History  . Marital Status: Married    Spouse Name: N/A  . Number of Children: N/A  . Years of Education: N/A   Occupational History  . Not on file.   Social History Main Topics  . Smoking status: Never Smoker   . Smokeless tobacco: Never Used  . Alcohol Use: 0.5 oz/week    1 drink(s) per week  . Drug Use: No  . Sexual Activity: Not on file   Other Topics Concern  . Not on file   Social History Narrative    Past Surgical History  Procedure Laterality Date  . Hand surgery    . Abdominal hysterectomy    . Eye surgery Left     cataract surgery  . Knee arthroscopy Right   . Colonoscopy    . Cataract extraction w/phaco Right 06/24/2014    Procedure: CATARACT EXTRACTION PHACO AND INTRAOCULAR LENS PLACEMENT (IOC) RIGHT EYE WITH GONIOSYNECHIALYSIS;  Surgeon: Marylynn Pearson, MD;  Location: [facility];  Service: Ophthalmology;  Laterality: Right;    Family History  Problem Relation Age of Onset  . Hypertension Mother   . Stroke Mother   . Diabetes Father   . Sarcoidosis Sister   . Cancer Brother     prostate    Allergies  Allergen Reactions  . Ibuprofen     rash     Current Outpatient Prescriptions on File Prior to Visit  Medication Sig Dispense Refill  . estradiol (ESTRACE) 1 MG tablet Take 1 tablet (1 mg total) by mouth daily. 90 tablet 3  . fexofenadine-pseudoephedrine (ALLEGRA-D) 60-120 MG per tablet Take 1 tablet by mouth 2 (two) times daily between meals as needed.     . Ginkgo Biloba (GINKOBA PO) Take 1 capsule by mouth daily.    . Multiple Vitamins-Minerals (MULTIVITAMIN WITH MINERALS) tablet Take 1 tablet by mouth daily.    . Tafluprost (ZIOPTAN) 0.0015 % SOLN Apply 1 drop to eye at bedtime.    Marland Kitchen HYDROcodone-acetaminophen (NORCO) 10-325 MG per tablet Take 1 tablet by mouth every 8 (eight) hours as needed. (Patient not taking: Reported on 02/15/2015) 30 tablet 0   No current facility-administered medications on file prior to visit.    BP 120/74 mmHg  Pulse 93  Temp(Src) 98.4 F (36.9 C) (Oral)  Ht 5' (1.524 m)  Wt 223 lb (101.152 kg)  BMI 43.55 kg/m2  SpO2 97%     Review of Systems  Constitutional: Negative.   HENT: Negative for congestion, dental problem, hearing loss, rhinorrhea, sinus pressure, sore throat  and tinnitus.   Eyes: Negative for pain, discharge and visual disturbance.  Respiratory: Negative for cough and shortness of breath.   Cardiovascular: Negative for chest pain, palpitations and leg swelling.  Gastrointestinal: Negative for nausea, vomiting, abdominal pain, diarrhea, constipation, blood in stool and abdominal distention.  Genitourinary: Negative for dysuria, urgency, frequency, hematuria, flank pain, vaginal bleeding, vaginal discharge, difficulty urinating, vaginal pain and pelvic pain.  Musculoskeletal: Positive for joint swelling and gait problem. Negative for arthralgias.  Skin: Negative for rash.  Neurological: Negative for dizziness, syncope, speech difficulty, weakness, numbness and headaches.  Hematological: Negative for adenopathy.  Psychiatric/Behavioral: Negative for behavioral problems, dysphoric  mood and agitation. The patient is not nervous/anxious.        Objective:   Physical Exam  Constitutional: She is oriented to person, place, and time. She appears well-developed and well-nourished.  Obese  HENT:  Head: Normocephalic.  Right Ear: External ear normal.  Left Ear: External ear normal.  Mouth/Throat: Oropharynx is clear and moist.  Eyes: Conjunctivae and EOM are normal. Pupils are equal, round, and reactive to light.  Neck: Normal range of motion. Neck supple. No thyromegaly present.  Cardiovascular: Normal rate, regular rhythm, normal heart sounds and intact distal pulses.   Pulmonary/Chest: Effort normal and breath sounds normal.  Abdominal: Soft. Bowel sounds are normal. She exhibits no mass. There is no tenderness.  Musculoskeletal: Normal range of motion.  Lymphadenopathy:    She has no cervical adenopathy.  Neurological: She is alert and oriented to person, place, and time.  Skin: Skin is warm and dry. No rash noted.  Psychiatric: She has a normal mood and affect. Her behavior is normal.          Assessment & Plan:   Right knee pain.  Will set up for orthopedic evaluation.  Patient has been symptomatic for greater than 1 year. Asthma, stable History sarcoidosis History diverticulosis  All medications updated Orthopedic  Referral Schedule CPX

## 2015-06-14 NOTE — Addendum Note (Signed)
Addended by: Ailene Rud E on: 06/14/2015 10:37 AM   Modules accepted: Orders

## 2015-06-14 NOTE — Progress Notes (Signed)
Pre visit review using our clinic review tool, if applicable. No additional management support is needed unless otherwise documented below in the visit note. 

## 2015-06-14 NOTE — Patient Instructions (Signed)
You need to lose weight.  Consider a lower calorie diet and regular exercise.  Limit your sodium (Salt) intake  Return in 6 months for follow-up

## 2015-06-17 ENCOUNTER — Telehealth: Payer: Self-pay | Admitting: Internal Medicine

## 2015-06-17 MED ORDER — CELECOXIB 200 MG PO CAPS: 200.0000 mg | ORAL_CAPSULE | Freq: Two times a day (BID) | ORAL | Status: DC

## 2015-06-17 MED ORDER — HYDROCODONE-ACETAMINOPHEN 10-325 MG PO TABS: 1.0000 | ORAL_TABLET | Freq: Three times a day (TID) | ORAL | Status: DC | PRN

## 2015-06-17 NOTE — Telephone Encounter (Signed)
Spoke to pt, told her I called the pharmacy and they did not receive the Rx that was sent in last for 1 capsule twice a day. I sent new Rx to the pharmacy. Pt verbalized understanding. Asked pt if she still needed Hydrocodone? Pt said yes, for pain in knee arthritis and not being able to take Celebrex twice a day. Told pt okay will have Rx ready for pickup by noon. Pt verbalized understanding. Rx printed and signed, put at front desk.

## 2015-06-17 NOTE — Telephone Encounter (Signed)
Pt states the instructions for her celecoxib (CELEBREX) 200 MG capsule per her pharmacy. They say it says 1 x day. But our instructions state 2 x /day. They would only give pt 30 tabs. Pt states she needs 2 day pls advise.  Now she says since she has only taken 1/day her pain is worse and she would like a refill of her  HYDROcodone-acetaminophen (NORCO) 10-325 MG per tablet  [facility]

## 2015-07-05 ENCOUNTER — Encounter: Payer: Self-pay | Admitting: Podiatry

## 2015-07-05 ENCOUNTER — Ambulatory Visit (INDEPENDENT_AMBULATORY_CARE_PROVIDER_SITE_OTHER): Payer: Medicare Other | Admitting: Podiatry

## 2015-07-05 ENCOUNTER — Ambulatory Visit (INDEPENDENT_AMBULATORY_CARE_PROVIDER_SITE_OTHER): Payer: Medicare Other

## 2015-07-05 VITALS — BP 127/86 | HR 90 | Resp 12

## 2015-07-05 DIAGNOSIS — M76829 Posterior tibial tendinitis, unspecified leg: Secondary | ICD-10-CM

## 2015-07-05 DIAGNOSIS — B351 Tinea unguium: Secondary | ICD-10-CM

## 2015-07-05 DIAGNOSIS — M79676 Pain in unspecified toe(s): Secondary | ICD-10-CM

## 2015-07-05 DIAGNOSIS — M779 Enthesopathy, unspecified: Secondary | ICD-10-CM

## 2015-07-05 DIAGNOSIS — Q6650 Congenital pes planus, unspecified foot: Secondary | ICD-10-CM

## 2015-07-05 NOTE — Progress Notes (Signed)
   Subjective:    Patient ID: [name], female    DOB: 1950/12/22, 64 y.o.   MRN: [id number]  HPI: She presents today with several year duration of pain to the medial longitudinal arch of the bilateral foot left greater than the right with burning and tingling. She states it seems to be getting worse over the past 2-3 years. She never was able to purchase a new para shoes as we had previously suggested. She also states that she has severely elongated painful toenails that need trimming.     Review of Systems  Constitutional: Positive for fatigue and unexpected weight change.  HENT: Positive for hearing loss.   Eyes: Positive for visual disturbance.  Respiratory: Positive for shortness of breath and wheezing.   Musculoskeletal: Positive for joint swelling and gait problem.  Allergic/Immunologic: Positive for environmental allergies.       Objective:   Physical Exam: 64 year old female vital signs stable in no apparent distress. Strong palpable pulses bilateral. Neurologic sensorium is intact. Deep tendon reflexes are intact bilateral muscle strength +5 over 5 dorsiflexion plantar flexors and inverters everters on physical musculature is intact. Orthopedic evaluation of his x-rays all joints distal to the ankle for range of motion without crepitation. She has severe pes planus bilateral with tenderness on palpation of the posterior tibial tendon and burning upon palpation. Her toenails are thick yellow dystrophic onychomycotic no open lesions or wounds are noted.        Assessment & Plan:  Assessment: Pes planus with posterior tibial tendon dysfunction and posterior tibial tendinitis bilateral. Pain in limb secondary to onychomycosis 1 through 5 bilateral.  Plan: I encouraged her purchase a new pair shoes as I had suggested prior to being scan first set of orthotics. I also debrided her nails 1 through 5 bilaterally was a covered service secondary to pain.  Roselind Messier DPM

## 2015-07-12 ENCOUNTER — Encounter: Payer: Self-pay | Admitting: Internal Medicine

## 2015-07-12 ENCOUNTER — Ambulatory Visit (INDEPENDENT_AMBULATORY_CARE_PROVIDER_SITE_OTHER): Payer: Medicare Other | Admitting: Internal Medicine

## 2015-07-12 DIAGNOSIS — M545 Low back pain, unspecified: Secondary | ICD-10-CM

## 2015-07-12 MED ORDER — METHYLPREDNISOLONE ACETATE 80 MG/ML IJ SUSP
80.0000 mg | Freq: Once | INTRAMUSCULAR | Status: AC
  Administered 2015-07-12: 80 mg via INTRAMUSCULAR

## 2015-07-12 NOTE — Patient Instructions (Signed)
Bronchospasm, Adult  A bronchospasm is a spasm or tightening of the airways going into the lungs. During a bronchospasm breathing becomes more difficult because the airways get smaller. When this happens there can be coughing, a whistling sound when breathing (wheezing), and difficulty breathing. Bronchospasm is often associated with asthma, but not all patients who experience a bronchospasm have asthma.  CAUSES   A bronchospasm is caused by inflammation or irritation of the airways. The inflammation or irritation may be triggered by:   · Allergies (such as to animals, pollen, food, or mold). Allergens that cause bronchospasm may cause wheezing immediately after exposure or many hours later.    · Infection. Viral infections are believed to be the most common cause of bronchospasm.    · Exercise.    · Irritants (such as pollution, cigarette smoke, strong odors, aerosol sprays, and paint fumes).    · Weather changes. Winds increase molds and pollens in the air. Rain refreshes the air by washing irritants out. Cold air may cause inflammation.    · Stress and emotional upset.    SIGNS AND SYMPTOMS   · Wheezing.    · Excessive nighttime coughing.    · Frequent or severe coughing with a simple cold.    · Chest tightness.    · Shortness of breath.    DIAGNOSIS   Bronchospasm is usually diagnosed through a history and physical exam. Tests, such as chest X-rays, are sometimes done to look for other conditions.  TREATMENT   · Inhaled medicines can be given to open up your airways and help you breathe. The medicines can be given using either an inhaler or a nebulizer machine.  · Corticosteroid medicines may be given for severe bronchospasm, usually when it is associated with asthma.  HOME CARE INSTRUCTIONS   · Always have a plan prepared for seeking medical care. Know when to call your health care provider and local emergency services (911 in the [place] Know where you can access local emergency care.  · Only take medicines as  directed by your health care provider.  · If you were prescribed an inhaler or nebulizer machine, ask your health care provider to explain how to use it correctly. Always use a spacer with your inhaler if you were given one.  · It is necessary to remain calm during an attack. Try to relax and breathe more slowly.   · Control your home environment in the following ways:      Change your heating and air conditioning filter at least once a month.      Limit your use of fireplaces and wood stoves.    Do not smoke and do not allow smoking in your home.      Avoid exposure to perfumes and fragrances.      Get rid of pests (such as roaches and mice) and their droppings.      Throw away plants if you see mold on them.      Keep your house clean and dust free.      Replace carpet with wood, tile, or vinyl flooring. Carpet can trap dander and dust.      Use allergy-proof pillows, mattress covers, and box spring covers.      Wash bed sheets and blankets every week in hot water and dry them in a dryer.      Use blankets that are made of polyester or cotton.      Wash hands frequently.  SEEK MEDICAL CARE IF:   · You have muscle aches.    · You have chest pain.    · The sputum changes from clear or   white to yellow, green, gray, or bloody.    · The sputum you cough up gets thicker.    · There are problems that may be related to the medicine you are given, such as a rash, itching, swelling, or trouble breathing.    SEEK IMMEDIATE MEDICAL CARE IF:   · You have worsening wheezing and coughing even after taking your prescribed medicines.    · You have increased difficulty breathing.    · You develop severe chest pain.  MAKE SURE YOU:   · Understand these instructions.  · Will watch your condition.  · Will get help right away if you are not doing well or get worse.     This information is not intended to replace advice given to you by your health care provider. Make sure you discuss any questions you have with your health care  provider.     Document Released: 09/21/2003 Document Revised: 10/09/2014 Document Reviewed: 03/10/2013  Elsevier Interactive Patient Education ©2016 [employer]

## 2015-07-12 NOTE — Progress Notes (Signed)
Pre visit review using our clinic review tool, if applicable. No additional management support is needed unless otherwise documented below in the visit note. 

## 2015-07-12 NOTE — Progress Notes (Signed)
Subjective:    Patient ID: [name], female    DOB: Jan 25, 1951, 64 y.o.   MRN: [id number]  HPI   64 year old patient who has a history of chronic low back pain.  She has also seen orthopedics recently due to chronic right knee pain. Over the weekend she was doing some heavy moving and had the onset of right lumbar pain. There is no radicular component pain is aggravated by certain movements twisting, bending and stooping but no radiation down the right leg. Her right knee pain is modestly improved.  She was told that she likely will eventually need total knee replacement surgery. Maintenance medications include when necessary analgesics as well as daily.  Celebrex.  Past Medical History  Diagnosis Date  . Acute bronchitis   . Pain in joint, ankle and foot   . Unspecified asthma(493.90)   . Backache, unspecified   . Other chest pain   . Diverticulosis of colon (without mention of hemorrhage)   . Lumbago   . Edema   . Sarcoidosis ([place])   . Tibialis tendinitis   . Obesity   . Glaucoma     Social History   Social History  . Marital Status: Married    Spouse Name: N/A  . Number of Children: N/A  . Years of Education: N/A   Occupational History  . Not on file.   Social History Main Topics  . Smoking status: Never Smoker   . Smokeless tobacco: Never Used  . Alcohol Use: 0.5 oz/week    1 drink(s) per week  . Drug Use: No  . Sexual Activity: Not on file   Other Topics Concern  . Not on file   Social History Narrative    Past Surgical History  Procedure Laterality Date  . Hand surgery    . Abdominal hysterectomy    . Eye surgery Left     cataract surgery  . Knee arthroscopy Right   . Colonoscopy    . Cataract extraction w/phaco Right 06/24/2014    Procedure: CATARACT EXTRACTION PHACO AND INTRAOCULAR LENS PLACEMENT (IOC) RIGHT EYE WITH GONIOSYNECHIALYSIS;  Surgeon: Marylynn Pearson, MD;  Location: [facility];  Service: Ophthalmology;  Laterality: Right;    Family  History  Problem Relation Age of Onset  . Hypertension Mother   . Stroke Mother   . Diabetes Father   . Sarcoidosis Sister   . Cancer Brother     prostate    Allergies  Allergen Reactions  . Ibuprofen     rash    Current Outpatient Prescriptions on File Prior to Visit  Medication Sig Dispense Refill  . albuterol (PROAIR HFA) 108 (90 BASE) MCG/ACT inhaler Inhale 2 puffs into the lungs every 6 (six) hours as needed. 1 Inhaler 5  . celecoxib (CELEBREX) 200 MG capsule Take 1 capsule (200 mg total) by mouth 2 (two) times daily. 60 capsule 5  . estradiol (ESTRACE) 1 MG tablet Take 1 tablet (1 mg total) by mouth daily. 90 tablet 3  . fexofenadine-pseudoephedrine (ALLEGRA-D) 60-120 MG per tablet Take 1 tablet by mouth 2 (two) times daily between meals as needed.     . Fluticasone-Salmeterol (ADVAIR DISKUS) 250-50 MCG/DOSE AEPB Inhale 1 puff into the lungs 2 (two) times daily. 180 each 1  . furosemide (LASIX) 40 MG tablet Take 1 tablet (40 mg total) by mouth daily. prn 30 tablet 2  . Ginkgo Biloba (GINKOBA PO) Take 1 capsule by mouth daily.    Marland Kitchen HYDROcodone-acetaminophen (NORCO) 10-325 MG  per tablet Take 1 tablet by mouth every 8 (eight) hours as needed. 30 tablet 0  . Multiple Vitamins-Minerals (MULTIVITAMIN WITH MINERALS) tablet Take 1 tablet by mouth daily.    Marland Kitchen omeprazole (PRILOSEC) 20 MG capsule Take 1 capsule (20 mg total) by mouth daily. 30 capsule 5  . Tafluprost (ZIOPTAN) 0.0015 % SOLN Apply 1 drop to eye at bedtime.     No current facility-administered medications on file prior to visit.    BP 130/86 mmHg  Pulse 93  Temp(Src) 98.3 F (36.8 C) (Oral)  Resp 20  Ht 5' (1.524 m)  Wt 233 lb (105.688 kg)  BMI 45.50 kg/m2  SpO2 97%      Review of Systems  Constitutional: Negative.   HENT: Positive for ear pain. Negative for congestion, dental problem, hearing loss, rhinorrhea, sinus pressure, sore throat and tinnitus.   Eyes: Negative for pain, discharge and visual  disturbance.  Respiratory: Negative for cough and shortness of breath.   Cardiovascular: Negative for chest pain, palpitations and leg swelling.  Gastrointestinal: Negative for nausea, vomiting, abdominal pain, diarrhea, constipation, blood in stool and abdominal distention.  Genitourinary: Negative for dysuria, urgency, frequency, hematuria, flank pain, vaginal bleeding, vaginal discharge, difficulty urinating, vaginal pain and pelvic pain.  Musculoskeletal: Positive for back pain. Negative for joint swelling, arthralgias and gait problem.  Skin: Negative for rash.  Neurological: Negative for dizziness, syncope, speech difficulty, weakness, numbness and headaches.  Hematological: Negative for adenopathy.  Psychiatric/Behavioral: Negative for behavioral problems, dysphoric mood and agitation. The patient is not nervous/anxious.        Objective:   Physical Exam  Constitutional: She appears well-developed and well-nourished. No distress.  Musculoskeletal:  Negative straight leg test Range of motion right hip does aggravate the right lumbar pain          Assessment & Plan:   Acute lumbar strain.  Will continue analgesics and Celebrex as needed Will call.  There is any clinical worsening  Left ear pain.  Unremarkable exam except for some cerumen in the canal

## 2015-07-14 ENCOUNTER — Ambulatory Visit (INDEPENDENT_AMBULATORY_CARE_PROVIDER_SITE_OTHER): Payer: Medicare Other | Admitting: Family Medicine

## 2015-07-14 ENCOUNTER — Encounter: Payer: Self-pay | Admitting: Family Medicine

## 2015-07-14 VITALS — BP 136/78 | HR 86 | Temp 98.8°F | Ht 60.0 in | Wt 233.2 lb

## 2015-07-14 DIAGNOSIS — Y92099 Unspecified place in other non-institutional residence as the place of occurrence of the external cause: Secondary | ICD-10-CM

## 2015-07-14 DIAGNOSIS — M545 Low back pain, unspecified: Secondary | ICD-10-CM

## 2015-07-14 DIAGNOSIS — Y92009 Unspecified place in unspecified non-institutional (private) residence as the place of occurrence of the external cause: Principal | ICD-10-CM | POA: Insufficient documentation

## 2015-07-14 DIAGNOSIS — W19XXXA Unspecified fall, initial encounter: Secondary | ICD-10-CM | POA: Diagnosis not present

## 2015-07-14 MED ORDER — HYDROCODONE-ACETAMINOPHEN 10-325 MG PO TABS: 1.0000 | ORAL_TABLET | Freq: Three times a day (TID) | ORAL | Status: DC | PRN

## 2015-07-14 NOTE — Progress Notes (Signed)
Pre visit review using our clinic review tool, if applicable. No additional management support is needed unless otherwise documented below in the visit note. 

## 2015-07-14 NOTE — Progress Notes (Signed)
Subjective:    Patient ID: [name], female    DOB: 11/21/1950, 64 y.o.   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 TIBIALIS TENDINITIS 08/25/2009  . CHEST PAIN, OTHER, PAIN 10/21/2008  . PEDAL EDEMA 07/22/2008  . ANKLE PAIN, BILATERAL 04/01/2008  . LOW BACK PAIN 04/01/2008  . ACUTE BRONCHITIS 09/23/2007  . SARCOIDOSIS 09/02/2007  . Asthma 09/02/2007  . DIVERTICULOSIS, COLON 07/11/2007  . BACK PAIN, CHRONIC 07/11/2007   Past Medical History  Diagnosis Date  . Acute bronchitis   . Pain in joint, ankle and foot   . Unspecified asthma(493.90)   . Backache, unspecified   . Other chest pain   . Diverticulosis of colon (without mention of hemorrhage)   . Lumbago   . Edema   . Sarcoidosis ([place])   . Tibialis tendinitis   . Obesity   . Glaucoma    Past Surgical History  Procedure Laterality Date  . Hand surgery    . Abdominal hysterectomy    . Eye surgery Left     cataract surgery  . Knee arthroscopy Right   . Colonoscopy    . Cataract extraction w/phaco Right 06/24/2014    Procedure: CATARACT EXTRACTION PHACO AND INTRAOCULAR LENS PLACEMENT (IOC) RIGHT EYE WITH GONIOSYNECHIALYSIS;  Surgeon: Marylynn Pearson, MD;  Location: [facility];  Service: Ophthalmology;   Laterality: Right;   Social History  Substance Use Topics  . Smoking status: Never Smoker   . Smokeless tobacco: Never Used  . Alcohol Use: 0.6 oz/week    1 Standard drinks or equivalent per week   Family History  Problem Relation Age of Onset  . Hypertension Mother   . Stroke Mother   . Diabetes Father   . Sarcoidosis Sister   . Cancer Brother     prostate   Allergies  Allergen Reactions  . Ibuprofen     rash   Current Outpatient Prescriptions on File Prior to Visit  Medication Sig Dispense Refill  . albuterol (PROAIR HFA) 108 (90 BASE) MCG/ACT inhaler Inhale 2 puffs into the lungs every 6 (six) hours as needed. 1 Inhaler 5  . celecoxib (CELEBREX) 200 MG capsule Take 1 capsule (200 mg total) by mouth 2 (two) times daily. 60 capsule 5  . estradiol (ESTRACE) 1 MG tablet Take 1 tablet (1 mg total) by mouth daily. 90 tablet 3  . fexofenadine-pseudoephedrine (ALLEGRA-D) 60-120 MG per tablet Take 1 tablet by mouth 2 (two) times daily between meals as needed.     . Fluticasone-Salmeterol (ADVAIR DISKUS) 250-50 MCG/DOSE AEPB Inhale 1 puff  into the lungs 2 (two) times daily. 180 each 1  . furosemide (LASIX) 40 MG tablet Take 1 tablet (40 mg total) by mouth daily. prn 30 tablet 2  . Ginkgo Biloba (GINKOBA PO) Take 1 capsule by mouth daily.    Marland Kitchen HYDROcodone-acetaminophen (NORCO) 10-325 MG per tablet Take 1 tablet by mouth every 8 (eight) hours as needed. 30 tablet 0  . Multiple Vitamins-Minerals (MULTIVITAMIN WITH MINERALS) tablet Take 1 tablet by mouth daily.    Marland Kitchen omeprazole (PRILOSEC) 20 MG capsule Take 1 capsule (20 mg total) by mouth daily. 30 capsule 5  . Tafluprost (ZIOPTAN) 0.0015 % SOLN Apply 1 drop to eye at bedtime.     No current facility-administered medications on file prior to visit.     Review of Systems Review of Systems  Constitutional: Negative for fever, appetite change, fatigue and unexpected weight change.  Eyes: Negative for pain and visual disturbance.    Respiratory: Negative for cough and shortness of breath.   Cardiovascular: Negative for cp or palpitations    Gastrointestinal: Negative for nausea, diarrhea and constipation.  Genitourinary: Negative for urgency and frequency.  Skin: Negative for pallor or rash   MSK pos for acute on chronic low back and R leg and knee pain  Neurological: Negative for weakness, light-headedness, and headaches. pos for some paresthesias on R side  Hematological: Negative for adenopathy. Does not bruise/bleed easily.  Psychiatric/Behavioral: Negative for dysphoric mood. The patient is not nervous/anxious.         Objective:   Physical Exam  Constitutional: She appears well-developed and well-nourished. No distress.  obese and well appearing   HENT:  Head: Normocephalic and atraumatic.  Eyes: Conjunctivae and EOM are normal. Pupils are equal, round, and reactive to light.  Neck: Normal range of motion. Neck supple.  Cardiovascular: Normal rate and regular rhythm.   Pulmonary/Chest: Effort normal and breath sounds normal.  Abdominal: Soft. Bowel sounds are normal. She exhibits no distension. There is no tenderness.  Musculoskeletal: She exhibits edema and tenderness.  LS- limited rom (extension) Tender over R paraspinal muscles and piriformis area -with palpable spasm No bony tenderness  Neg SLR No neuro deficits  Gait is slow but steady without assistance   Nl rom both hips  No trochanteric or leg tenderness  R knee- limited rom consistent with OA, no effusion Crepitus noted  Pos mcmurry test - but no ecchymosis and only mild swelling  Stable on drawer and lachman exam     Lymphadenopathy:    She has no cervical adenopathy.  Neurological: She is alert. She has normal strength and normal reflexes. She displays no atrophy. No cranial nerve deficit or sensory deficit. She exhibits normal muscle tone. Coordination normal.  Skin: Skin is warm and dry. No rash noted. No erythema. No pallor.   Psychiatric: She has a normal mood and affect.          Assessment & Plan:   Problem List Items Addressed This Visit      Other   Fall in home - Primary    Pt fell on hard floor wearing socks Disc dec in balance with age and inc in fall risk  Enc to wear shoes at all times in the home except when in bed  Given handout on fall prevention   Examined for injuries-do not suspect fractures-however did fall on her side with knee OA as well as chronic back pain       LOW BACK PAIN  Seen recently for pain in low back and R buttock- then worsened after a fall on that side  Pt c/o some paresthesia and pain rad to R leg - but neuro exam is normal  Recommend intermittent ice and short walks  Refilled norco (one time only since I am not her pcp)  celebrex daily with food  She may end up needing PT  Enc f/u with her PCP   Long disc re fall prev >25 minutes spent in face to face time with patient, >50% spent in counselling or coordination of care       Relevant Medications   HYDROcodone-acetaminophen (NORCO) 10-325 MG tablet

## 2015-07-14 NOTE — Patient Instructions (Signed)
Continue celebrex  Continue norco with caution of sedation and falls  I can refill a small amount until you can get your next refill from Dr Raliegh Ip  Since you had a fresh injury - and so I do want you to use a cold compress on the area of back and buttock that hurt - 10 minutes at a time Try to walk slowly (and safely) in between for several minutes  If no further improvement - get back to Dr Raliegh Ip for re evaluation in the next week  If worse - let him know earlier    No walking in socks- shoes at all times !

## 2015-07-15 NOTE — Assessment & Plan Note (Signed)
Seen recently for pain in low back and R buttock- then worsened after a fall on that side  Pt c/o some paresthesia and pain rad to R leg - but neuro exam is normal  Recommend intermittent ice and short walks  Refilled norco (one time only since I am not her pcp)  celebrex daily with food  She may end up needing PT  Enc f/u with her PCP   Long disc re fall prev >25 minutes spent in face to face time with patient, >50% spent in counselling or coordination of care

## 2015-07-15 NOTE — Assessment & Plan Note (Signed)
Pt fell on hard floor wearing socks Disc dec in balance with age and inc in fall risk  Enc to wear shoes at all times in the home except when in bed  Given handout on fall prevention   Examined for injuries-do not suspect fractures-however did fall on her side with knee OA as well as chronic back pain

## 2015-09-02 ENCOUNTER — Other Ambulatory Visit: Payer: Medicare Other

## 2015-09-06 ENCOUNTER — Other Ambulatory Visit (INDEPENDENT_AMBULATORY_CARE_PROVIDER_SITE_OTHER): Payer: Medicare Other

## 2015-09-06 DIAGNOSIS — M545 Low back pain: Secondary | ICD-10-CM | POA: Diagnosis not present

## 2015-09-06 DIAGNOSIS — Z0000 Encounter for general adult medical examination without abnormal findings: Secondary | ICD-10-CM

## 2015-09-06 LAB — POCT URINALYSIS DIPSTICK
Bilirubin, UA: NEGATIVE
Glucose, UA: NEGATIVE
Ketones, UA: NEGATIVE
LEUKOCYTES UA: NEGATIVE
Nitrite, UA: NEGATIVE
PH UA: 7.5
RBC UA: NEGATIVE
SPEC GRAV UA: 1.02
Urobilinogen, UA: 0.2

## 2015-09-06 LAB — HEPATIC FUNCTION PANEL
ALT: 16 U/L (ref 0–35)
AST: 17 U/L (ref 0–37)
Albumin: 3.9 g/dL (ref 3.5–5.2)
Alkaline Phosphatase: 76 U/L (ref 39–117)
BILIRUBIN DIRECT: 0.1 mg/dL (ref 0.0–0.3)
BILIRUBIN TOTAL: 0.3 mg/dL (ref 0.2–1.2)
TOTAL PROTEIN: 7 g/dL (ref 6.0–8.3)

## 2015-09-06 LAB — CBC WITH DIFFERENTIAL/PLATELET
BASOS PCT: 0.8 % (ref 0.0–3.0)
Basophils Absolute: 0.1 10*3/uL (ref 0.0–0.1)
Eosinophils Absolute: 0.2 10*3/uL (ref 0.0–0.7)
Eosinophils Relative: 3.9 % (ref 0.0–5.0)
HEMATOCRIT: 40.7 % (ref 36.0–46.0)
HEMOGLOBIN: 13 g/dL (ref 12.0–15.0)
LYMPHS PCT: 35.3 % (ref 12.0–46.0)
Lymphs Abs: 2.2 10*3/uL (ref 0.7–4.0)
MCHC: 32 g/dL (ref 30.0–36.0)
MCV: 94.1 fl (ref 78.0–100.0)
MONO ABS: 0.7 10*3/uL (ref 0.1–1.0)
MONOS PCT: 11 % (ref 3.0–12.0)
Neutro Abs: 3.1 10*3/uL (ref 1.4–7.7)
Neutrophils Relative %: 49 % (ref 43.0–77.0)
Platelets: 367 10*3/uL (ref 150.0–400.0)
RBC: 4.32 Mil/uL (ref 3.87–5.11)
RDW: 15.5 % (ref 11.5–15.5)
WBC: 6.3 10*3/uL (ref 4.0–10.5)

## 2015-09-06 LAB — LIPID PANEL
CHOLESTEROL: 189 mg/dL (ref 0–200)
HDL: 58.1 mg/dL (ref >39.00)
LDL CALC: 114 mg/dL — AB (ref 0–99)
NonHDL: 130.72
TRIGLYCERIDES: 85 mg/dL (ref 0.0–149.0)
Total CHOL/HDL Ratio: 3
VLDL: 17 mg/dL (ref 0.0–40.0)

## 2015-09-06 LAB — BASIC METABOLIC PANEL
BUN: 10 mg/dL (ref 6–23)
CHLORIDE: 104 meq/L (ref 96–112)
CO2: 27 meq/L (ref 19–32)
Calcium: 9.2 mg/dL (ref 8.4–10.5)
Creatinine, Ser: 0.65 mg/dL (ref 0.40–1.20)
GFR: 117.16 mL/min (ref >60.00)
Glucose, Bld: 98 mg/dL (ref 70–99)
POTASSIUM: 5.1 meq/L (ref 3.5–5.1)
SODIUM: 139 meq/L (ref 135–145)

## 2015-09-06 LAB — TSH: TSH: 1.23 u[IU]/mL (ref 0.35–4.50)

## 2015-09-10 ENCOUNTER — Encounter: Payer: Self-pay | Admitting: Internal Medicine

## 2015-09-10 ENCOUNTER — Ambulatory Visit (INDEPENDENT_AMBULATORY_CARE_PROVIDER_SITE_OTHER): Payer: Medicare Other | Admitting: Internal Medicine

## 2015-09-10 VITALS — BP 140/64 | HR 91 | Temp 98.1°F | Ht 60.0 in | Wt 226.0 lb

## 2015-09-10 DIAGNOSIS — Z0000 Encounter for general adult medical examination without abnormal findings: Secondary | ICD-10-CM | POA: Diagnosis not present

## 2015-09-10 DIAGNOSIS — M545 Low back pain: Secondary | ICD-10-CM

## 2015-09-10 DIAGNOSIS — J4520 Mild intermittent asthma, uncomplicated: Secondary | ICD-10-CM

## 2015-09-10 DIAGNOSIS — D869 Sarcoidosis, unspecified: Secondary | ICD-10-CM

## 2015-09-10 DIAGNOSIS — G8929 Other chronic pain: Secondary | ICD-10-CM

## 2015-09-10 MED ORDER — HYDROCODONE-ACETAMINOPHEN 10-325 MG PO TABS: 1.0000 | ORAL_TABLET | Freq: Three times a day (TID) | ORAL | Status: DC | PRN

## 2015-09-10 NOTE — Progress Notes (Signed)
Subjective:    Patient ID: [name], female    DOB: 1951-03-03, 64 y.o.   MRN: [id number]  HPI  64 year old patient who is seen today for a preventive health examination.  She has had a recent gynecologic visit with Pap and mammogram Last colonoscopy 2008  Social history.  Lawyer still works full-time.  Widowed  Past Medical History  Diagnosis Date  . Acute bronchitis   . Pain in joint, ankle and foot   . Unspecified asthma(493.90)   . Backache, unspecified   . Other chest pain   . Diverticulosis of colon (without mention of hemorrhage)   . Lumbago   . Edema   . Sarcoidosis ([place])   . Tibialis tendinitis   . Obesity   . Glaucoma     Social History   Social History  . Marital Status: Married    Spouse Name: N/A  . Number of Children: N/A  . Years of Education: N/A   Occupational History  . Not on file.   Social History Main Topics  . Smoking status: Never Smoker   . Smokeless tobacco: Never Used  . Alcohol Use: 0.6 oz/week    1 Standard drinks or equivalent per week  . Drug Use: No  . Sexual Activity: Not on file   Other Topics Concern  . Not on file   Social History Narrative    Past Surgical History  Procedure Laterality Date  . Hand surgery    . Abdominal hysterectomy    . Eye surgery Left     cataract surgery  . Knee arthroscopy Right   . Colonoscopy    . Cataract extraction w/phaco Right 06/24/2014    Procedure: CATARACT EXTRACTION PHACO AND INTRAOCULAR LENS PLACEMENT (IOC) RIGHT EYE WITH GONIOSYNECHIALYSIS;  Surgeon: Marylynn Pearson, MD;  Location: [facility];  Service: Ophthalmology;  Laterality: Right;    Family History  Problem Relation Age of Onset  . Hypertension Mother   . Stroke Mother   . Diabetes Father   . Sarcoidosis Sister   . Cancer Brother     prostate    Allergies  Allergen Reactions  . Ibuprofen     rash    Current Outpatient Prescriptions on File Prior to Visit  Medication Sig Dispense Refill  . albuterol (PROAIR  HFA) 108 (90 BASE) MCG/ACT inhaler Inhale 2 puffs into the lungs every 6 (six) hours as needed. 1 Inhaler 5  . celecoxib (CELEBREX) 200 MG capsule Take 1 capsule (200 mg total) by mouth 2 (two) times daily. 60 capsule 5  . estradiol (ESTRACE) 1 MG tablet Take 1 tablet (1 mg total) by mouth daily. 90 tablet 3  . fexofenadine-pseudoephedrine (ALLEGRA-D) 60-120 MG per tablet Take 1 tablet by mouth 2 (two) times daily between meals as needed.     . Fluticasone-Salmeterol (ADVAIR DISKUS) 250-50 MCG/DOSE AEPB Inhale 1 puff into the lungs 2 (two) times daily. 180 each 1  . furosemide (LASIX) 40 MG tablet Take 1 tablet (40 mg total) by mouth daily. prn 30 tablet 2  . Ginkgo Biloba (GINKOBA PO) Take 1 capsule by mouth daily.    Marland Kitchen HYDROcodone-acetaminophen (NORCO) 10-325 MG tablet Take 1 tablet by mouth every 8 (eight) hours as needed. 30 tablet 0  . Multiple Vitamins-Minerals (MULTIVITAMIN WITH MINERALS) tablet Take 1 tablet by mouth daily.    Marland Kitchen omeprazole (PRILOSEC) 20 MG capsule Take 1 capsule (20 mg total) by mouth daily. 30 capsule 5  . Tafluprost (ZIOPTAN) 0.0015 % SOLN Apply  1 drop to eye at bedtime.     No current facility-administered medications on file prior to visit.    BP 140/64 mmHg  Pulse 91  Temp(Src) 98.1 F (36.7 C) (Oral)  Ht 5' (1.524 m)  Wt 226 lb (102.513 kg)  BMI 44.14 kg/m2  SpO2 98%   1. Risk factors, based on past  M,S,F history.  Current vascular risk factors, none  2.  Physical activities: Fairly sedentary due to chronic back and bilateral knee pain  3.  Depression/mood: No history of major depression or mood disorder  4.  Hearing: No deficits  5.  ADL's: Independent   6.  Fall risk: Low  7.  Home safety: No problems identified  8.  Height weight, and visual acuity; height and weight stable no change in visual acuity  9.  Counseling: Heart healthy diet modest weight loss.  All encouraged  10. Lab orders based on risk factors: Laboratory profile including  lipid panel reviewed  11. Referral : Not appropriate at this time  12. Care plan: We'll need follow-up colonoscopy in approximately 2 years.  We'll continue heart healthy diet and attempts at weight loss  13. Cognitive assessment: Alert and oriented with normal affect.  No cognitive dysfunction   14. Screening: Patient provided with a written and personalized 5-10 year screening schedule in the AVS.  .  We'll continue annual exams with screening lab.  Will continue annual mammograms with GYN follow-up.  Will continue colonoscopies at 10 year intervals.  Yearly eye examination is also encouraged  15. Provider List Update: Includes primary care medicine ophthalmology OB/GYN and orthopedics   Review of Systems  Constitutional: Negative.   HENT: Negative for congestion, dental problem, hearing loss, rhinorrhea, sinus pressure, sore throat and tinnitus.   Eyes: Negative for pain, discharge and visual disturbance.  Respiratory: Negative for cough and shortness of breath.   Cardiovascular: Negative for chest pain, palpitations and leg swelling.  Gastrointestinal: Negative for nausea, vomiting, abdominal pain, diarrhea, constipation, blood in stool and abdominal distention.  Genitourinary: Negative for dysuria, urgency, frequency, hematuria, flank pain, vaginal bleeding, vaginal discharge, difficulty urinating, vaginal pain and pelvic pain.  Musculoskeletal: Positive for back pain. Negative for joint swelling, arthralgias and gait problem.       Bilateral knee pain  Skin: Negative for rash.  Neurological: Negative for dizziness, syncope, speech difficulty, weakness, numbness and headaches.  Hematological: Negative for adenopathy.  Psychiatric/Behavioral: Negative for behavioral problems, dysphoric mood and agitation. The patient is not nervous/anxious.        Objective:   Physical Exam  Constitutional: She is oriented to person, place, and time. She appears well-developed and well-nourished.    HENT:  Head: Normocephalic and atraumatic.  Right Ear: External ear normal.  Left Ear: External ear normal.  Mouth/Throat: Oropharynx is clear and moist.  Eyes: Conjunctivae and EOM are normal.  Neck: Normal range of motion. Neck supple. No JVD present. No thyromegaly present.  Cardiovascular: Normal rate, regular rhythm, normal heart sounds and intact distal pulses.   No murmur heard. Pulmonary/Chest: Effort normal and breath sounds normal. She has no wheezes. She has no rales.  Abdominal: Soft. Bowel sounds are normal. She exhibits no distension and no mass. There is no tenderness. There is no rebound and no guarding.  Musculoskeletal: Normal range of motion. She exhibits no edema or tenderness.  Neurological: She is alert and oriented to person, place, and time. She has normal reflexes. No cranial nerve deficit. She exhibits normal muscle  tone. Coordination normal.  Skin: Skin is warm and dry. No rash noted.  Psychiatric: She has a normal mood and affect. Her behavior is normal.          Assessment & Plan:   Preventive health. Obesity.  Weight loss encouraged Chronic low back pain Bilateral knee pain History sarcoidosis History diverticulosis with diverticulitis   Recheck one year

## 2015-09-10 NOTE — Progress Notes (Signed)
Pre visit review using our clinic review tool, if applicable. No additional management support is needed unless otherwise documented below in the visit note. 

## 2015-09-10 NOTE — Patient Instructions (Signed)
It is important that you exercise regularly, at least 20 minutes 3 to 4 times per week.  If you develop chest pain or shortness of breath seek  medical attention.  You need to lose weight.  Consider a lower calorie diet and regular exercise.  Return in one year for follow-up  Menopause is a normal process in which your reproductive ability comes to an end. This process happens gradually over a span of months to years, usually between the ages of 32 and 30. Menopause is complete when you have missed 12 consecutive menstrual periods. It is important to talk with your health care provider about some of the most common conditions that affect postmenopausal women, such as heart disease, cancer, and bone loss (osteoporosis). Adopting a healthy lifestyle and getting preventive care can help to promote your health and wellness. Those actions can also lower your chances of developing some of these common conditions. WHAT SHOULD I KNOW ABOUT MENOPAUSE? During menopause, you may experience a number of symptoms, such as:  Moderate-to-severe hot flashes.  Night sweats.  Decrease in sex drive.  Mood swings.  Headaches.  Tiredness.  Irritability.  Memory problems.  Insomnia. Choosing to treat or not to treat menopausal changes is an individual decision that you make with your health care provider. WHAT SHOULD I KNOW ABOUT HORMONE REPLACEMENT THERAPY AND SUPPLEMENTS? Hormone therapy products are effective for treating symptoms that are associated with menopause, such as hot flashes and night sweats. Hormone replacement carries certain risks, especially as you become older. If you are thinking about using estrogen or estrogen with progestin treatments, discuss the benefits and risks with your health care provider. WHAT SHOULD I KNOW ABOUT HEART DISEASE AND STROKE? Heart disease, heart attack, and stroke become more likely as you age. This may be due, in part, to the hormonal changes that your body  experiences during menopause. These can affect how your body processes dietary fats, triglycerides, and cholesterol. Heart attack and stroke are both medical emergencies. There are many things that you can do to help prevent heart disease and stroke:  Have your blood pressure checked at least every 1-2 years. High blood pressure causes heart disease and increases the risk of stroke.  If you are 67-15 years old, ask your health care provider if you should take aspirin to prevent a heart attack or a stroke.  Do not use any tobacco products, including cigarettes, chewing tobacco, or electronic cigarettes. If you need help quitting, ask your health care provider.  It is important to eat a healthy diet and maintain a healthy weight.  Be sure to include plenty of vegetables, fruits, low-fat dairy products, and lean protein.  Avoid eating foods that are high in solid fats, added sugars, or salt (sodium).  Get regular exercise. This is one of the most important things that you can do for your health.  Try to exercise for at least 150 minutes each week. The type of exercise that you do should increase your heart rate and make you sweat. This is known as moderate-intensity exercise.  Try to do strengthening exercises at least twice each week. Do these in addition to the moderate-intensity exercise.  Know your numbers.Ask your health care provider to check your cholesterol and your blood glucose. Continue to have your blood tested as directed by your health care provider. WHAT SHOULD I KNOW ABOUT CANCER SCREENING? There are several types of cancer. Take the following steps to reduce your risk and to catch any cancer development  as early as possible. Breast Cancer  Practice breast self-awareness.  This means understanding how your breasts normally appear and feel.  It also means doing regular breast self-exams. Let your health care provider know about any changes, no matter how small.  If you  are 52 or older, have a clinician do a breast exam (clinical breast exam or CBE) every year. Depending on your age, family history, and medical history, it may be recommended that you also have a yearly breast X-ray (mammogram).  If you have a family history of breast cancer, talk with your health care provider about genetic screening.  If you are at high risk for breast cancer, talk with your health care provider about having an MRI and a mammogram every year.  Breast cancer (BRCA) gene test is recommended for women who have family members with BRCA-related cancers. Results of the assessment will determine the need for genetic counseling and BRCA1 and for BRCA2 testing. BRCA-related cancers include these types:  Breast. This occurs in males or females.  Ovarian.  Tubal. This may also be called fallopian tube cancer.  Cancer of the abdominal or pelvic lining (peritoneal cancer).  Prostate.  Pancreatic. Cervical, Uterine, and Ovarian Cancer Your health care provider may recommend that you be screened regularly for cancer of the pelvic organs. These include your ovaries, uterus, and vagina. This screening involves a pelvic exam, which includes checking for microscopic changes to the surface of your cervix (Pap test).  For women ages 21-65, health care providers may recommend a pelvic exam and a Pap test every three years. For women ages 53-65, they may recommend the Pap test and pelvic exam, combined with testing for human papilloma virus (HPV), every five years. Some types of HPV increase your risk of cervical cancer. Testing for HPV may also be done on women of any age who have unclear Pap test results.  Other health care providers may not recommend any screening for nonpregnant women who are considered low risk for pelvic cancer and have no symptoms. Ask your health care provider if a screening pelvic exam is right for you.  If you have had past treatment for cervical cancer or a condition  that could lead to cancer, you need Pap tests and screening for cancer for at least 20 years after your treatment. If Pap tests have been discontinued for you, your risk factors (such as having a new sexual partner) need to be reassessed to determine if you should start having screenings again. Some women have medical problems that increase the chance of getting cervical cancer. In these cases, your health care provider may recommend that you have screening and Pap tests more often.  If you have a family history of uterine cancer or ovarian cancer, talk with your health care provider about genetic screening.  If you have vaginal bleeding after reaching menopause, tell your health care provider.  There are currently no reliable tests available to screen for ovarian cancer. Lung Cancer Lung cancer screening is recommended for adults 42-55 years old who are at high risk for lung cancer because of a history of smoking. A yearly low-dose CT scan of the lungs is recommended if you:  Currently smoke.  Have a history of at least 30 pack-years of smoking and you currently smoke or have quit within the past 15 years. A pack-year is smoking an average of one pack of cigarettes per day for one year. Yearly screening should:  Continue until it has been 15 years  since you quit.  Stop if you develop a health problem that would prevent you from having lung cancer treatment. Colorectal Cancer  This type of cancer can be detected and can often be prevented.  Routine colorectal cancer screening usually begins at age 79 and continues through age 44.  If you have risk factors for colon cancer, your health care provider may recommend that you be screened at an earlier age.  If you have a family history of colorectal cancer, talk with your health care provider about genetic screening.  Your health care provider may also recommend using home test kits to check for hidden blood in your stool.  A small camera at  the end of a tube can be used to examine your colon directly (sigmoidoscopy or colonoscopy). This is done to check for the earliest forms of colorectal cancer.  Direct examination of the colon should be repeated every 5-10 years until age 8. However, if early forms of precancerous polyps or small growths are found or if you have a family history or genetic risk for colorectal cancer, you may need to be screened more often. Skin Cancer  Check your skin from head to toe regularly.  Monitor any moles. Be sure to tell your health care provider:  About any new moles or changes in moles, especially if there is a change in a mole's shape or color.  If you have a mole that is larger than the size of a pencil eraser.  If any of your family members has a history of skin cancer, especially at a young age, talk with your health care provider about genetic screening.  Always use sunscreen. Apply sunscreen liberally and repeatedly throughout the day.  Whenever you are outside, protect yourself by wearing long sleeves, pants, a wide-brimmed hat, and sunglasses. WHAT SHOULD I KNOW ABOUT OSTEOPOROSIS? Osteoporosis is a condition in which bone destruction happens more quickly than new bone creation. After menopause, you may be at an increased risk for osteoporosis. To help prevent osteoporosis or the bone fractures that can happen because of osteoporosis, the following is recommended:  If you are 41-73 years old, get at least 1,000 mg of calcium and at least 600 mg of vitamin D per day.  If you are older than age 88 but younger than age 55, get at least 1,200 mg of calcium and at least 600 mg of vitamin D per day.  If you are older than age 58, get at least 1,200 mg of calcium and at least 800 mg of vitamin D per day. Smoking and excessive alcohol intake increase the risk of osteoporosis. Eat foods that are rich in calcium and vitamin D, and do weight-bearing exercises several times each week as directed by  your health care provider. WHAT SHOULD I KNOW ABOUT HOW MENOPAUSE AFFECTS [place]? Depression may occur at any age, but it is more common as you become older. Common symptoms of depression include:  Low or sad mood.  Changes in sleep patterns.  Changes in appetite or eating patterns.  Feeling an overall lack of motivation or enjoyment of activities that you previously enjoyed.  Frequent crying spells. Talk with your health care provider if you think that you are experiencing depression. WHAT SHOULD I KNOW ABOUT IMMUNIZATIONS? It is important that you get and maintain your immunizations. These include:  Tetanus, diphtheria, and pertussis (Tdap) booster vaccine.  Influenza every year before the flu season begins.  Pneumonia vaccine.  Shingles vaccine. Your health care  provider may also recommend other immunizations.   This information is not intended to replace advice given to you by your health care provider. Make sure you discuss any questions you have with your health care provider.   Document Released: 11/10/2005 Document Revised: 10/09/2014 Document Reviewed: 05/21/2014 Elsevier Interactive Patient Education [employer].

## 2015-09-22 ENCOUNTER — Ambulatory Visit (INDEPENDENT_AMBULATORY_CARE_PROVIDER_SITE_OTHER): Payer: Medicare Other | Admitting: Podiatry

## 2015-09-22 ENCOUNTER — Telehealth: Payer: Self-pay | Admitting: *Deleted

## 2015-09-22 ENCOUNTER — Encounter: Payer: Self-pay | Admitting: Podiatry

## 2015-09-22 VITALS — BP 131/56 | HR 89 | Resp 16

## 2015-09-22 DIAGNOSIS — M76829 Posterior tibial tendinitis, unspecified leg: Secondary | ICD-10-CM

## 2015-09-22 NOTE — Telephone Encounter (Addendum)
Orders and pt demographics sent.  09/23/2015 - [employer] PRIOR AUTHORIZATION - [phone] VALID TO 11/07/2015 FOR MRI [id number], DX [id number].  FAXED TO [facility].

## 2015-09-22 NOTE — Progress Notes (Signed)
She presents today for follow-up of pain to her left foot. She has purchased a pair of new balance tennis shoes which she states causes her to have more pain on the medial aspect of her left ankle. The last time she was in she had subtalar joint capsulitis of the left foot associated with her pes planus. We injected that area and has since left her asymptomatic. The majority of her symptoms now are long the medial aspect. She denies any further trauma. She states that the shoes are too heavy and cause her medial ankle to hurt.  Objective: Vital signs are stable she is alert and oriented 3. Pulses are palpable left foot. Posterior tibial tendon dysfunction is present. She has pain on palpation of the posterior tibial tendon of the left foot with very little inability to invert her left foot against resistance. Her right foot does much better. No open lesions or wounds.  Assessment: Pain in limb secondary to posterior tibial tendon dysfunction and severe pes planus. I would like to rule out a posterior tibial tendon rupture or tear with an MRI.  Plan: Chronic pain left ankle with severe pes planus worsening in nature. This necessitates an MRI of the left foot. We discussed the possible need for surgical intervention consisting of a possible repair of the posterior tibial tendon. I expressed to her that if there was no tear present then physical therapy would be a conservative option. I encouraged her to wear the tennis shoes for stability.

## 2015-09-28 ENCOUNTER — Ambulatory Visit
Admission: RE | Admit: 2015-09-28 | Discharge: 2015-09-28 | Disposition: A | Discharge status: Home / Self Care | Payer: Medicare Other | Source: Ambulatory Visit | Attending: Podiatry | Admitting: Podiatry

## 2015-09-28 DIAGNOSIS — M76829 Posterior tibial tendinitis, unspecified leg: Secondary | ICD-10-CM

## 2015-09-29 ENCOUNTER — Telehealth: Payer: Self-pay | Admitting: *Deleted

## 2015-09-29 NOTE — Telephone Encounter (Signed)
Dr. Milinda Pointer reviewed pt's MRI results and requested pt make an appt.  Orders to pt, she has an appt 10/06/2015.

## 2015-10-06 ENCOUNTER — Ambulatory Visit (INDEPENDENT_AMBULATORY_CARE_PROVIDER_SITE_OTHER): Payer: Medicare Other | Admitting: Podiatry

## 2015-10-06 ENCOUNTER — Encounter: Payer: Self-pay | Admitting: Podiatry

## 2015-10-06 VITALS — BP 128/78 | HR 97 | Resp 18

## 2015-10-06 DIAGNOSIS — B351 Tinea unguium: Secondary | ICD-10-CM

## 2015-10-06 DIAGNOSIS — M76829 Posterior tibial tendinitis, unspecified leg: Secondary | ICD-10-CM | POA: Diagnosis not present

## 2015-10-06 DIAGNOSIS — M79676 Pain in unspecified toe(s): Secondary | ICD-10-CM | POA: Diagnosis not present

## 2015-10-06 NOTE — Progress Notes (Signed)
[name] presents today for follow-up of her MRI regarding her left ankle. She would also like to have her nails cut.  Objective: Vital signs are stable she is alert and oriented 3 she still has pain on palpation of the posterior tibial tendon at the posterior superior aspect of the medial malleolus. There is bogginess and fluctuance within the tendon sheath. MRI does demonstrate severe tendinitis and tendinopathy associated with the posterior tibial tendon. No tears or splits were noted. Her toenails are thick yellow dystrophic onychomycotic. Pulses remain palpable bilateral. No open lesions or wounds.  Assessment: Pes planus with severe posterior tibial tendinitis left foot. Pain in limb secondary to onychomycosis 1 through 5 bilateral.  Plan: Injected the posterior tibial tendon sheath today with Kenalog and local anesthetic. We did discuss the pros and cons of surgery and at this point she opted for injections. We will also have a [place] brace made for her left foot. We debrided her nails and I will follow-up with her in 1 month.

## 2015-11-03 ENCOUNTER — Ambulatory Visit (INDEPENDENT_AMBULATORY_CARE_PROVIDER_SITE_OTHER): Payer: Medicare Other | Admitting: *Deleted

## 2015-11-03 DIAGNOSIS — M76829 Posterior tibial tendinitis, unspecified leg: Secondary | ICD-10-CM

## 2015-11-04 ENCOUNTER — Ambulatory Visit: Payer: Medicare Other

## 2015-11-05 NOTE — Progress Notes (Signed)
Patient presents to pick up [place]. Instructions were given. Follow up in 4 weeks

## 2015-12-01 ENCOUNTER — Ambulatory Visit (INDEPENDENT_AMBULATORY_CARE_PROVIDER_SITE_OTHER): Payer: Medicare Other | Admitting: Podiatry

## 2015-12-01 DIAGNOSIS — M76829 Posterior tibial tendinitis, unspecified leg: Secondary | ICD-10-CM | POA: Diagnosis not present

## 2015-12-01 NOTE — Progress Notes (Signed)
Dispensed AFO brace by pedorthist, Betha. Instructions given and follow up as needed.

## 2016-01-05 ENCOUNTER — Ambulatory Visit: Payer: Medicare Other | Admitting: Podiatry

## 2016-02-03 ENCOUNTER — Ambulatory Visit: Payer: Medicare Other

## 2016-05-31 ENCOUNTER — Other Ambulatory Visit: Payer: Self-pay | Admitting: *Deleted

## 2016-05-31 MED ORDER — CELECOXIB 200 MG PO CAPS: 200.0000 mg | ORAL_CAPSULE | Freq: Two times a day (BID) | ORAL | 2 refills | Status: DC

## 2016-06-15 ENCOUNTER — Encounter: Payer: Self-pay | Admitting: Family Medicine

## 2016-06-15 ENCOUNTER — Ambulatory Visit (INDEPENDENT_AMBULATORY_CARE_PROVIDER_SITE_OTHER): Payer: Medicare Other | Admitting: Family Medicine

## 2016-06-15 VITALS — BP 108/70 | HR 100 | Temp 98.9°F | Wt 233.0 lb

## 2016-06-15 DIAGNOSIS — K5732 Diverticulitis of large intestine without perforation or abscess without bleeding: Secondary | ICD-10-CM | POA: Diagnosis not present

## 2016-06-15 MED ORDER — HYDROCODONE-ACETAMINOPHEN 10-325 MG PO TABS: 1.0000 | ORAL_TABLET | Freq: Three times a day (TID) | ORAL | 0 refills | Status: DC | PRN

## 2016-06-15 MED ORDER — AMOXICILLIN-POT CLAVULANATE 875-125 MG PO TABS: 1.0000 | ORAL_TABLET | Freq: Two times a day (BID) | ORAL | 0 refills | Status: DC

## 2016-06-15 NOTE — Patient Instructions (Signed)
Diverticulitis °Diverticulitis is inflammation or infection of small pouches in your colon that form when you have a condition called diverticulosis. The pouches in your colon are called diverticula. Your colon, or large intestine, is where water is absorbed and stool is formed. °Complications of diverticulitis can include: °· Bleeding. °· Severe infection. °· Severe pain. °· Perforation of your colon. °· Obstruction of your colon. °CAUSES  °Diverticulitis is caused by bacteria. °Diverticulitis happens when stool becomes trapped in diverticula. This allows bacteria to grow in the diverticula, which can lead to inflammation and infection. °RISK FACTORS °People with diverticulosis are at risk for diverticulitis. Eating a diet that does not include enough fiber from fruits and vegetables may make diverticulitis more likely to develop. °SYMPTOMS  °Symptoms of diverticulitis may include: °· Abdominal pain and tenderness. The pain is normally located on the left side of the abdomen, but may occur in other areas. °· Fever and chills. °· Bloating. °· Cramping. °· Nausea. °· Vomiting. °· Constipation. °· Diarrhea. °· Blood in your stool. °DIAGNOSIS  °Your health care provider will ask you about your medical history and do a physical exam. You may need to have tests done because many medical conditions can cause the same symptoms as diverticulitis. Tests may include: °· Blood tests. °· Urine tests. °· Imaging tests of the abdomen, including X-rays and CT scans. °When your condition is under control, your health care provider may recommend that you have a colonoscopy. A colonoscopy can show how severe your diverticula are and whether something else is causing your symptoms. °TREATMENT  °Most cases of diverticulitis are mild and can be treated at home. Treatment may include: °· Taking over-the-counter pain medicines. °· Following a clear liquid diet. °· Taking antibiotic medicines by mouth for 7-10 days. °More severe cases may  be treated at a hospital. Treatment may include: °· Not eating or drinking. °· Taking prescription pain medicine. °· Receiving antibiotic medicines through an IV tube. °· Receiving fluids and nutrition through an IV tube. °· Surgery. °HOME CARE INSTRUCTIONS  °· Follow your health care provider's instructions carefully. °· Follow a full liquid diet or other diet as directed by your health care provider. After your symptoms improve, your health care provider may tell you to change your diet. He or she may recommend you eat a high-fiber diet. Fruits and vegetables are good sources of fiber. Fiber makes it easier to pass stool. °· Take fiber supplements or probiotics as directed by your health care provider. °· Only take medicines as directed by your health care provider. °· Keep all your follow-up appointments. °SEEK MEDICAL CARE IF:  °· Your pain does not improve. °· You have a hard time eating food. °· Your bowel movements do not return to normal. °SEEK IMMEDIATE MEDICAL CARE IF:  °· Your pain becomes worse. °· Your symptoms do not get better. °· Your symptoms suddenly get worse. °· You have a fever. °· You have repeated vomiting. °· You have bloody or black, tarry stools. °MAKE SURE YOU:  °· Understand these instructions. °· Will watch your condition. °· Will get help right away if you are not doing well or get worse. °  °This information is not intended to replace advice given to you by your health care provider. Make sure you discuss any questions you have with your health care provider. °  °Document Released: 06/28/2005 Document Revised: 09/23/2013 Document Reviewed: 08/13/2013 °Elsevier Interactive Patient Education ©2016 [employer] ° °

## 2016-06-15 NOTE — Progress Notes (Signed)
Subjective:    Patient ID: [name], female    DOB: 1951-04-16, 64 y.o.   MRN: [id number]  HPI This is a 65 yo female who presents today with 2 days of left lower abdominal pain and stomach grumbling. Had a small bowel movement which did not relieve pain. Abdomen feels bloated. No vomiting or nausea. No fever or chills.Has tried heating pad, tylenol and rest without improvement. She had CT documented diverticulitis 03/27/14. This pain feels the same. She admits to not eating as much fiber recently and notes that the last episode occurred during a period of high stress (her husband had just passed away). At this time she is under a great deal of stress with several approaching deadlines.  Usually sees Dr. Burnice Logan but he was off today. She is aware that she is due for annual visit and will make an appointment.   Past Medical History:  Diagnosis Date  . Acute bronchitis   . Backache, unspecified   . Diverticulosis of colon (without mention of hemorrhage)   . Edema   . Glaucoma   . Lumbago   . Obesity   . Other chest pain   . Pain in joint, ankle and foot   . Sarcoidosis ([place])   . Tibialis tendinitis   . Unspecified asthma(493.90)    Past Surgical History:  Procedure Laterality Date  . ABDOMINAL HYSTERECTOMY    . CATARACT EXTRACTION W/PHACO Right 06/24/2014   Procedure: CATARACT EXTRACTION PHACO AND INTRAOCULAR LENS PLACEMENT (IOC) RIGHT EYE WITH GONIOSYNECHIALYSIS;  Surgeon: Marylynn Pearson, MD;  Location: [facility];  Service: Ophthalmology;  Laterality: Right;  . COLONOSCOPY    . EYE SURGERY Left    cataract surgery  . HAND SURGERY    . KNEE ARTHROSCOPY Right    Family History  Problem Relation Age of Onset  . Hypertension Mother   . Stroke Mother   . Diabetes Father   . Sarcoidosis Sister   . Cancer Brother     prostate   Social History  Substance Use Topics  . Smoking status: Never Smoker  . Smokeless tobacco: Never Used  . Alcohol use 0.6 oz/week    1 Standard  drinks or equivalent per week      Review of Systems Per HPI    Objective:   Physical Exam  Constitutional: She is oriented to person, place, and time. She appears well-developed and well-nourished. No distress.  Looks uncomfortable with ambulation. Morbidly obese.   HENT:  Head: Normocephalic and atraumatic.  Eyes: Conjunctivae are normal.  Cardiovascular: Normal rate and regular rhythm.   Pulmonary/Chest: Effort normal and breath sounds normal.  Abdominal: Soft. Bowel sounds are normal. She exhibits no distension. There is tenderness (LLQ tender to palpation). There is guarding. There is no rebound.  Neurological: She is alert and oriented to person, place, and time.  Skin: Skin is warm and dry. She is not diaphoretic.  Psychiatric: She has a normal mood and affect. Her behavior is normal. Judgment and thought content normal.  Vitals reviewed.     BP 108/70   Pulse 100   Temp 98.9 F (37.2 C)   Wt 233 lb (105.7 kg)   SpO2 96%   BMI 45.50 kg/m  Wt Readings from Last 3 Encounters:  06/15/16 233 lb (105.7 kg)  09/10/15 226 lb (102.5 kg)  07/14/15 233 lb 4 oz (105.8 kg)       Assessment & Plan:  1. Diverticulitis of colon without hemorrhage - Provided written  and verbal information regarding diagnosis and treatment. - RTC/ED instructions given - amoxicillin-clavulanate (AUGMENTIN) 875-125 MG tablet; Take 1 tablet by mouth 2 (two) times daily.  Dispense: 20 tablet; Refill: 0 - HYDROcodone-acetaminophen (NORCO) 10-325 MG tablet; Take 1 tablet by mouth every 8 (eight) hours as needed.  Dispense: 10 tablet; Refill: 0   Clarene Reamer, FNP-BC  [facility]  06/15/2016 9:32 AM

## 2016-06-23 ENCOUNTER — Ambulatory Visit (INDEPENDENT_AMBULATORY_CARE_PROVIDER_SITE_OTHER): Payer: Medicare Other | Admitting: Family Medicine

## 2016-06-23 VITALS — BP 124/74 | HR 118 | Temp 98.7°F | Ht 60.0 in | Wt 228.0 lb

## 2016-06-23 DIAGNOSIS — K5732 Diverticulitis of large intestine without perforation or abscess without bleeding: Secondary | ICD-10-CM | POA: Diagnosis not present

## 2016-06-23 MED ORDER — METRONIDAZOLE 500 MG PO TABS: 500.0000 mg | ORAL_TABLET | Freq: Three times a day (TID) | ORAL | 0 refills | Status: DC

## 2016-06-23 MED ORDER — CIPROFLOXACIN HCL 500 MG PO TABS: 500.0000 mg | ORAL_TABLET | Freq: Two times a day (BID) | ORAL | 0 refills | Status: DC

## 2016-06-23 NOTE — Progress Notes (Signed)
Pre visit review using our clinic review tool, if applicable. No additional management support is needed unless otherwise documented below in the visit note. 

## 2016-06-24 ENCOUNTER — Encounter: Payer: Self-pay | Admitting: Family Medicine

## 2016-06-24 NOTE — Progress Notes (Signed)
   Subjective:    Patient ID: [name], female    DOB: 21-Aug-1951, 65 y.o.   MRN: [id number]  HPI Here for recurrent LLQ pains and also she passed a small amount of bloody mucus per rectum last night. She hasnever had this before. No nausea or vomiting, no fever. She was seen here on 06-15-16 and diagnosed with diverticulitis. She was started on Augmentin and consumed only liquids for several days. She was feeling better and then went from liquids straight to a meal of pork chop, baked beans, cabbage, and a hush puppy last night. Then the LLQ pains worsened again.    Review of Systems  Constitutional: Negative.   Respiratory: Negative.   Cardiovascular: Negative.   Gastrointestinal: Positive for abdominal pain and blood in stool. Negative for abdominal distention, anal bleeding, constipation, diarrhea, nausea, rectal pain and vomiting.  Genitourinary: Negative.        Objective:   Physical Exam  Constitutional: She appears well-developed and well-nourished.  Cardiovascular: Normal rate, regular rhythm, normal heart sounds and intact distal pulses.   Pulmonary/Chest: Effort normal and breath sounds normal.  Abdominal: Soft. Bowel sounds are normal. She exhibits no distension and no mass. There is no rebound and no guarding.  Mildly tender in the LLQ          Assessment & Plan:  Partially treated diverticulitis. I advised her to go back to a liquid or soft diet at this point, such as rice or bananas or mashed potatoes. Stop the Augmentin and start on Flagyl and Cipro. Advance the diet very slowly as she feels better. Recheck prn.  Laurey Morale, MD

## 2016-07-25 ENCOUNTER — Other Ambulatory Visit: Payer: Self-pay | Admitting: *Deleted

## 2016-07-25 ENCOUNTER — Ambulatory Visit (INDEPENDENT_AMBULATORY_CARE_PROVIDER_SITE_OTHER): Payer: Medicare Other

## 2016-07-25 DIAGNOSIS — Z23 Encounter for immunization: Secondary | ICD-10-CM

## 2016-07-25 MED ORDER — ALBUTEROL SULFATE HFA 108 (90 BASE) MCG/ACT IN AERS: 2.0000 | INHALATION_SPRAY | Freq: Four times a day (QID) | RESPIRATORY_TRACT | 5 refills | Status: DC | PRN

## 2016-08-18 ENCOUNTER — Other Ambulatory Visit: Payer: Self-pay | Admitting: *Deleted

## 2016-08-18 MED ORDER — FLUTICASONE-SALMETEROL 250-50 MCG/DOSE IN AEPB: 1.0000 | INHALATION_SPRAY | Freq: Two times a day (BID) | RESPIRATORY_TRACT | 0 refills | Status: DC

## 2016-09-05 ENCOUNTER — Other Ambulatory Visit (INDEPENDENT_AMBULATORY_CARE_PROVIDER_SITE_OTHER): Payer: Medicare Other

## 2016-09-05 DIAGNOSIS — Z0000 Encounter for general adult medical examination without abnormal findings: Secondary | ICD-10-CM

## 2016-09-05 LAB — POC URINALSYSI DIPSTICK (AUTOMATED)
Bilirubin, UA: NEGATIVE
Blood, UA: NEGATIVE
Glucose, UA: NEGATIVE
KETONES UA: NEGATIVE
Leukocytes, UA: NEGATIVE
Nitrite, UA: NEGATIVE
PH UA: 7
Spec Grav, UA: 1.02
UROBILINOGEN UA: 1

## 2016-09-05 LAB — HEPATIC FUNCTION PANEL
ALBUMIN: 3.7 g/dL (ref 3.5–5.2)
ALT: 12 U/L (ref 0–35)
AST: 16 U/L (ref 0–37)
Alkaline Phosphatase: 75 U/L (ref 39–117)
BILIRUBIN TOTAL: 0.3 mg/dL (ref 0.2–1.2)
Bilirubin, Direct: 0 mg/dL (ref 0.0–0.3)
TOTAL PROTEIN: 7 g/dL (ref 6.0–8.3)

## 2016-09-05 LAB — CBC WITH DIFFERENTIAL/PLATELET
BASOS PCT: 1.1 % (ref 0.0–3.0)
Basophils Absolute: 0.1 10*3/uL (ref 0.0–0.1)
EOS PCT: 3.6 % (ref 0.0–5.0)
Eosinophils Absolute: 0.2 10*3/uL (ref 0.0–0.7)
HEMATOCRIT: 38.3 % (ref 36.0–46.0)
HEMOGLOBIN: 12.7 g/dL (ref 12.0–15.0)
LYMPHS PCT: 38.2 % (ref 12.0–46.0)
Lymphs Abs: 2.3 10*3/uL (ref 0.7–4.0)
MCHC: 33 g/dL (ref 30.0–36.0)
MCV: 91.9 fl (ref 78.0–100.0)
MONOS PCT: 8.3 % (ref 3.0–12.0)
Monocytes Absolute: 0.5 10*3/uL (ref 0.1–1.0)
Neutro Abs: 2.9 10*3/uL (ref 1.4–7.7)
Neutrophils Relative %: 48.8 % (ref 43.0–77.0)
Platelets: 394 10*3/uL (ref 150.0–400.0)
RBC: 4.17 Mil/uL (ref 3.87–5.11)
RDW: 15.5 % (ref 11.5–15.5)
WBC: 6 10*3/uL (ref 4.0–10.5)

## 2016-09-05 LAB — LIPID PANEL
CHOLESTEROL: 171 mg/dL (ref 0–200)
HDL: 62.7 mg/dL (ref >39.00)
LDL CALC: 96 mg/dL (ref 0–99)
NonHDL: 108.21
Total CHOL/HDL Ratio: 3
Triglycerides: 62 mg/dL (ref 0.0–149.0)
VLDL: 12.4 mg/dL (ref 0.0–40.0)

## 2016-09-05 LAB — TSH: TSH: 1.21 u[IU]/mL (ref 0.35–4.50)

## 2016-09-05 LAB — BASIC METABOLIC PANEL
BUN: 7 mg/dL (ref 6–23)
CHLORIDE: 105 meq/L (ref 96–112)
CO2: 27 mEq/L (ref 19–32)
Calcium: 8.8 mg/dL (ref 8.4–10.5)
Creatinine, Ser: 0.65 mg/dL (ref 0.40–1.20)
GFR: 116.8 mL/min (ref >60.00)
Glucose, Bld: 109 mg/dL — ABNORMAL HIGH (ref 70–99)
POTASSIUM: 4 meq/L (ref 3.5–5.1)
SODIUM: 141 meq/L (ref 135–145)

## 2016-09-11 ENCOUNTER — Ambulatory Visit (INDEPENDENT_AMBULATORY_CARE_PROVIDER_SITE_OTHER): Payer: Medicare Other | Admitting: Internal Medicine

## 2016-09-11 ENCOUNTER — Encounter: Payer: Self-pay | Admitting: Internal Medicine

## 2016-09-11 ENCOUNTER — Other Ambulatory Visit: Payer: Self-pay | Admitting: Obstetrics and Gynecology

## 2016-09-11 VITALS — BP 148/80 | HR 97 | Temp 97.6°F | Ht 60.0 in | Wt 232.2 lb

## 2016-09-11 DIAGNOSIS — Z23 Encounter for immunization: Secondary | ICD-10-CM | POA: Diagnosis not present

## 2016-09-11 DIAGNOSIS — R928 Other abnormal and inconclusive findings on diagnostic imaging of breast: Secondary | ICD-10-CM

## 2016-09-11 DIAGNOSIS — R0609 Other forms of dyspnea: Secondary | ICD-10-CM

## 2016-09-11 DIAGNOSIS — Z0000 Encounter for general adult medical examination without abnormal findings: Secondary | ICD-10-CM | POA: Diagnosis not present

## 2016-09-11 MED ORDER — FUROSEMIDE 40 MG PO TABS: 40.0000 mg | ORAL_TABLET | Freq: Every day | ORAL | 2 refills | Status: DC

## 2016-09-11 MED ORDER — OMEPRAZOLE 20 MG PO CPDR: 20.0000 mg | DELAYED_RELEASE_CAPSULE | Freq: Every day | ORAL | 3 refills | Status: DC

## 2016-09-11 NOTE — Progress Notes (Signed)
Subjective:    Patient ID: [name], female    DOB: 1951-08-05, 65 y.o.   MRN: [id number]  HPI  65 year old patient who is seen today for a preventive health examination as well as an annual Medicare wellness visit  She has multiple concerns today.  She is followedin gynecology who has noted aheart murmur.  Patient complains of some stomach upset on Celebrex which is needed for arthritic complaints.  She has been on PPI therapy in the past Complaints include persistent left-sided abdominal pain.  She has been treated for diverticulitis. She's had a recent 3 D mammogram and has been asked for follow-up studies later this week.  She complains of poor energy, dyspnea on exertion as well as some swelling involving the ankles  Past Medical History:    sarcoidosis    Diverticulosis, colon  Past Surgical History:    Hysterectomy    hand surgery   Family History:    father died age 2.    History diabetes        Mother history of hypertension, os urethritis        One brother, history of prostate cancer        One sister, history of sarcoid   Past Medical History:  Diagnosis Date  . Acute bronchitis   . Backache, unspecified   . Diverticulosis of colon (without mention of hemorrhage)   . Edema   . Glaucoma   . Lumbago   . Obesity   . Other chest pain   . Pain in joint, ankle and foot   . Sarcoidosis ([place])   . Tibialis tendinitis   . Unspecified asthma(493.90)      Social History   Social History  . Marital status: Married    Spouse name: N/A  . Number of children: N/A  . Years of education: N/A   Occupational History  . Not on file.   Social History Main Topics  . Smoking status: Never Smoker  . Smokeless tobacco: Never Used  . Alcohol use 0.6 oz/week    1 Standard drinks or equivalent per week  . Drug use: No  . Sexual activity: Not on file   Other Topics Concern  . Not on file   Social History Narrative  . No narrative on file    Past  Surgical History:  Procedure Laterality Date  . ABDOMINAL HYSTERECTOMY    . CATARACT EXTRACTION W/PHACO Right 06/24/2014   Procedure: CATARACT EXTRACTION PHACO AND INTRAOCULAR LENS PLACEMENT (IOC) RIGHT EYE WITH GONIOSYNECHIALYSIS;  Surgeon: Marylynn Pearson, MD;  Location: [place];  Service: Ophthalmology;  Laterality: Right;  . COLONOSCOPY    . EYE SURGERY Left    cataract surgery  . HAND SURGERY    . KNEE ARTHROSCOPY Right     Family History  Problem Relation Age of Onset  . Hypertension Mother   . Stroke Mother   . Diabetes Father   . Sarcoidosis Sister   . Cancer Brother     prostate    Allergies  Allergen Reactions  . Ibuprofen     rash    Current Outpatient Prescriptions on File Prior to Visit  Medication Sig Dispense Refill  . albuterol (PROAIR HFA) 108 (90 Base) MCG/ACT inhaler Inhale 2 puffs into the lungs every 6 (six) hours as needed. 1 Inhaler 5  . celecoxib (CELEBREX) 200 MG capsule Take 1 capsule (200 mg total) by mouth 2 (two) times daily. 60 capsule 2  . estradiol (ESTRACE) 1 MG  tablet Take 1 tablet (1 mg total) by mouth daily. 90 tablet 3  . fexofenadine-pseudoephedrine (ALLEGRA-D) 60-120 MG per tablet Take 1 tablet by mouth 2 (two) times daily between meals as needed.     Marland Kitchen FLUoxetine (PROZAC) 10 MG capsule Take 10 mg by mouth daily.    . Fluticasone-Salmeterol (ADVAIR DISKUS) 250-50 MCG/DOSE AEPB Inhale 1 puff into the lungs 2 (two) times daily. 90 each 0  . furosemide (LASIX) 40 MG tablet Take 1 tablet (40 mg total) by mouth daily. prn 30 tablet 2  . Ginkgo Biloba (GINKOBA PO) Take 1 capsule by mouth daily.    . Multiple Vitamins-Minerals (MULTIVITAMIN WITH MINERALS) tablet Take 1 tablet by mouth daily.    . Tafluprost (ZIOPTAN) 0.0015 % SOLN Apply 1 drop to eye at bedtime.     No current facility-administered medications on file prior to visit.     BP (!) 148/80 (BP Location: Right Arm, Patient Position: Sitting, Cuff Size: Large)   Pulse 97   Temp 97.6 F  (36.4 C) (Oral)   Ht 5' (1.524 m)   Wt 232 lb 4 oz (105.3 kg)   SpO2 98%   BMI 45.36 kg/m   Medicare wellness visit  1. Risk factors, based on past  M,S,F history.  cardiac  Risk factors- none except for age  65.  Physical activities:limited by arthritic complaints, fatigue and dyspnea on exertion  3.  Depression/mood:complains of chronic fatigue.  Has been on SSRI therapy in the past and presently remains on fluoxetine  4.  Hearing:no deficits  5.  ADL's:independent  6.  Fall risk:low  7.  Home safety:no problems identified  8.  Height weight, and visual acuity;height stable.  There is been some modest weight gain over the past 2 years.  Status post bilateral cataract extraction surgeries  9.  Counseling:efforts at weight loss and increased activity.  Encouraged  10. Lab orders based on risk factors:laboratory studies reviewed  11. Referral :follow-up OB/GYN and radiology.  12. Care plan:continue efforts at weight loss and heart healthy diet..  Will check 2-D echocardiogram in view of history of heart murmur DOE and pedal edema  13. Cognitive assessment: alert and appropriate, normal affect no cognitive dysfunction  14. Screening: Patient provided with a written and personalized 5-10 year screening schedule in the AVS.    15. Provider List Update: GI radiology OB/GYN primary care medicine, orthopedics podiatry and ophthalmology     Review of Systems  Constitutional: Positive for activity change, fatigue and unexpected weight change. Negative for appetite change and fever.  HENT: Negative for congestion, dental problem, ear pain, hearing loss, mouth sores, nosebleeds, sinus pressure, sore throat, tinnitus, trouble swallowing and voice change.   Eyes: Negative for photophobia, pain, redness and visual disturbance.  Respiratory: Positive for shortness of breath. Negative for cough and chest tightness.   Cardiovascular: Positive for leg swelling. Negative for chest pain  and palpitations.  Gastrointestinal: Negative for abdominal distention, abdominal pain, blood in stool, constipation, diarrhea, nausea, rectal pain and vomiting.  Genitourinary: Negative for difficulty urinating, dysuria, flank pain, frequency, genital sores, hematuria, menstrual problem, pelvic pain, urgency, vaginal bleeding, vaginal discharge and vaginal pain.  Musculoskeletal: Positive for arthralgias and back pain. Negative for neck stiffness.  Skin: Negative for rash.  Neurological: Negative for dizziness, syncope, speech difficulty, weakness, light-headedness, numbness and headaches.  Hematological: Negative for adenopathy. Does not bruise/bleed easily.  Psychiatric/Behavioral: Positive for dysphoric mood. Negative for agitation, behavioral problems, self-injury and suicidal ideas. The patient  is not nervous/anxious.        Objective:   Physical Exam  Constitutional: She is oriented to person, place, and time. She appears well-developed and well-nourished.  Blood pressure 140/80 Weight 233  HENT:  Head: Normocephalic and atraumatic.  Right Ear: External ear normal.  Left Ear: External ear normal.  Mouth/Throat: Oropharynx is clear and moist.  Eyes: Conjunctivae and EOM are normal.  Left thyroidectomy scar  Neck: Normal range of motion. Neck supple. No JVD present. No thyromegaly present.  Cardiovascular: Normal rate, regular rhythm, normal heart sounds and intact distal pulses.   No murmur heard. No definite audible murmur Possibly grade 1/6 systolic murmur at the primary aortic area  Pulmonary/Chest: Effort normal and breath sounds normal. She has no wheezes. She has no rales.  Abdominal: Soft. Bowel sounds are normal. She exhibits no distension and no mass. There is no tenderness. There is no rebound and no guarding.  Genitourinary: Vagina normal.  Musculoskeletal: Normal range of motion. She exhibits edema. She exhibits no tenderness.  Trace ankle edema  Neurological: She  is alert and oriented to person, place, and time. She has normal reflexes. No cranial nerve deficit. She exhibits normal muscle tone. Coordination normal.  Skin: Skin is warm and dry. No rash noted.  Psychiatric: She has a normal mood and affect. Her behavior is normal.          Assessment & Plan:   Preventive health examination Medicare wellness visit Obesity Osteoarthritis History of sarcoidosis, stable Diverticulosis History depression Fatigue DOE.  Will check 2-D echocardiogram History of abnormal 3 D mammogram.  Patient is scheduled for follow-up additional imaging studies  Nyoka Cowden

## 2016-09-11 NOTE — Progress Notes (Signed)
Pre visit review using our clinic review tool, if applicable. No additional management support is needed unless otherwise documented below in the visit note. 

## 2016-09-11 NOTE — Patient Instructions (Addendum)

## 2016-09-19 ENCOUNTER — Ambulatory Visit
Admission: RE | Admit: 2016-09-19 | Discharge: 2016-09-19 | Disposition: A | Discharge status: Home / Self Care | Payer: Medicare Other | Source: Ambulatory Visit | Attending: Obstetrics and Gynecology | Admitting: Obstetrics and Gynecology

## 2016-09-19 DIAGNOSIS — R928 Other abnormal and inconclusive findings on diagnostic imaging of breast: Secondary | ICD-10-CM

## 2016-10-10 ENCOUNTER — Ambulatory Visit (HOSPITAL_COMMUNITY): Payer: Medicare Other | Attending: Internal Medicine

## 2016-10-10 DIAGNOSIS — R0609 Other forms of dyspnea: Secondary | ICD-10-CM | POA: Diagnosis not present

## 2016-10-10 DIAGNOSIS — I071 Rheumatic tricuspid insufficiency: Secondary | ICD-10-CM | POA: Insufficient documentation

## 2017-01-22 ENCOUNTER — Other Ambulatory Visit: Payer: Self-pay | Admitting: Internal Medicine

## 2017-02-13 ENCOUNTER — Ambulatory Visit (INDEPENDENT_AMBULATORY_CARE_PROVIDER_SITE_OTHER): Payer: Medicare Other | Admitting: Family Medicine

## 2017-02-13 ENCOUNTER — Encounter: Payer: Self-pay | Admitting: Family Medicine

## 2017-02-13 VITALS — BP 136/80 | HR 92 | Resp 12 | Ht 60.0 in | Wt 232.4 lb

## 2017-02-13 DIAGNOSIS — S93422A Sprain of deltoid ligament of left ankle, initial encounter: Secondary | ICD-10-CM

## 2017-02-13 DIAGNOSIS — M7662 Achilles tendinitis, left leg: Secondary | ICD-10-CM | POA: Diagnosis not present

## 2017-02-13 NOTE — Patient Instructions (Addendum)
   [name] I have seen you today for an acute visit.  A few things to remember from today's visit:   Sprain of left medial ankle joint, initial encounter  Achilles tendinitis of left lower extremity   ABC exercise as discussed.   The following may help:  Over the counter topical medications: Icy Hot or Asper cream with Lidocaine. Local ice. Please follow with podiatrists if worse or not any better in 2-3 weeks. Continue Celebrex.

## 2017-02-13 NOTE — Progress Notes (Signed)
HPI:   ACUTE VISIT:  Chief Complaint  Patient presents with  . left heel pain    Ms.[name] is a 66 y.o. female, who is here today complaining of 5 days of left heel pain.  She denies recent trauma or unusual physical activity. Pain is intermittently, exacerbated by walking, alleviated by rest. Pain is sharp, 8-9/10. She points to medial aspect of foot. She tries to keep pressure off walking "sideways", which started affecting her knees. She denies associated numbness, tingling, erythema, or local edema. She is taking OTC Tylenol, she is also on Celebrex 200 mg daily as needed for chronic pain. Hx of knee OA.  LE edema, stable. She is on Furosemide daily. Denies LE cyanosis or cold extremities.    Review of Systems  Constitutional: Negative for chills and fever.  Respiratory: Negative for shortness of breath and wheezing.   Cardiovascular: Positive for leg swelling.  Endocrine: Negative for polydipsia, polyphagia and polyuria.  Genitourinary: Negative for decreased urine volume and hematuria.  Musculoskeletal: Positive for arthralgias and gait problem. Negative for joint swelling.  Skin: Negative for rash and wound.  Neurological: Negative for weakness and numbness.  Psychiatric/Behavioral: Negative for confusion. The patient is nervous/anxious.       Current Outpatient Prescriptions on File Prior to Visit  Medication Sig Dispense Refill  . albuterol (PROAIR HFA) 108 (90 Base) MCG/ACT inhaler Inhale 2 puffs into the lungs every 6 (six) hours as needed. 1 Inhaler 5  . celecoxib (CELEBREX) 200 MG capsule TAKE ONE (1) CAPSULE BY MOUTH 2 TIMES DAILY 60 capsule 2  . estradiol (ESTRACE) 1 MG tablet Take 1 tablet (1 mg total) by mouth daily. 90 tablet 3  . fexofenadine-pseudoephedrine (ALLEGRA-D) 60-120 MG per tablet Take 1 tablet by mouth 2 (two) times daily between meals as needed.     Marland Kitchen FLUoxetine (PROZAC) 10 MG capsule Take 10 mg by mouth daily.    .  Fluticasone-Salmeterol (ADVAIR DISKUS) 250-50 MCG/DOSE AEPB Inhale 1 puff into the lungs 2 (two) times daily. 90 each 0  . furosemide (LASIX) 40 MG tablet Take 1 tablet (40 mg total) by mouth daily. prn 30 tablet 2  . Ginkgo Biloba (GINKOBA PO) Take 1 capsule by mouth daily.    . Multiple Vitamins-Minerals (MULTIVITAMIN WITH MINERALS) tablet Take 1 tablet by mouth daily.    Marland Kitchen omeprazole (PRILOSEC) 20 MG capsule Take 1 capsule (20 mg total) by mouth daily. 30 capsule 3  . Tafluprost (ZIOPTAN) 0.0015 % SOLN Apply 1 drop to eye at bedtime.     No current facility-administered medications on file prior to visit.      Past Medical History:  Diagnosis Date  . Acute bronchitis   . Backache, unspecified   . Diverticulosis of colon (without mention of hemorrhage)   . Edema   . Glaucoma   . Lumbago   . Obesity   . Other chest pain   . Pain in joint, ankle and foot   . Sarcoidosis   . Tibialis tendinitis   . Unspecified asthma(493.90)    Allergies  Allergen Reactions  . Ibuprofen     rash    Social History   Social History  . Marital status: Married    Spouse name: N/A  . Number of children: N/A  . Years of education: N/A   Social History Main Topics  . Smoking status: Never Smoker  . Smokeless tobacco: Never Used  . Alcohol use 0.6 oz/week  1 Standard drinks or equivalent per week  . Drug use: No  . Sexual activity: Not Asked   Other Topics Concern  . None   Social History Narrative  . None    Vitals:   02/13/17 0946  BP: 136/80  Pulse: 92  Resp: 12   Body mass index is 45.38 kg/m.   Physical Exam  Nursing note and vitals reviewed. Constitutional: She is oriented to person, place, and time. She appears well-developed. She does not appear ill. No distress.  HENT:  Head: Atraumatic.  Eyes: Conjunctivae are normal.  Cardiovascular: Normal rate and regular rhythm.   Pulses:      Dorsalis pedis pulses are 2+ on the left side.       Posterior tibial pulses  are 2+ on the left side.  Calf tenderness: No   Respiratory: Effort normal. No respiratory distress.  Musculoskeletal: She exhibits edema (1+ pitting edema LE bilateral.).       Left ankle: She exhibits normal range of motion and no swelling. Tenderness. Posterior TFL tenderness found. No AITFL, no head of 5th metatarsal and no proximal fibula tenderness found. Achilles tendon exhibits pain. Achilles tendon exhibits no defect and normal Thompson's test results.  Left foot: No tenderness upon palpation of heel. Pain upon palpation of achilles tendon, medial > lateral. Also palpation of medial aspect of ankle, posterior and right underneath. Pain is elicited with active extension/flexion. No pain with passive movement.   Neurological: She is alert and oriented to person, place, and time. Coordination normal.  No focal deficit appreciated. Gait stable, antalgic and assisted with cane.  Skin: Skin is warm. No rash noted. No erythema.  Psychiatric: She has a normal mood and affect. Her speech is normal.  Well groomed, good eye contact.      ASSESSMENT AND PLAN:   Sugar was seen today for left heel pain.  Diagnoses and all orders for this visit:  Sprain of left medial ankle joint, initial encounter  Because no Hx of direct trauma, no bony tenderness, or falls I do not think imaging is needed today. Local ice, elevation, and rest. OTC Icy Hot with Lidocaine may also help. ABC ROM exercises 3 times per day.  Achilles tendinitis of left lower extremity  No evidence of rupture. Continue Celebrex 200 mg daily as needed and Tylenol 500 mg 3 times per day. She has followed with podiatrists in the past, recommend arranging an appointment if pain is not any better in 2-3 weeks, before if pain gets worse. Fall prevention discussed.    Return if symptoms worsen or fail to improve.  15 min face to face OV. > 50% was dedicated to discussion of differential Dx,prognosis,treatment options,  and some side effects of medications she is already taking (Celebrex)       Victoria Outen G. [place], MD  [facility]. Oval office.

## 2017-04-28 ENCOUNTER — Other Ambulatory Visit: Payer: Self-pay | Admitting: Internal Medicine

## 2017-07-03 ENCOUNTER — Ambulatory Visit (INDEPENDENT_AMBULATORY_CARE_PROVIDER_SITE_OTHER): Payer: Medicare Other

## 2017-07-03 DIAGNOSIS — Z23 Encounter for immunization: Secondary | ICD-10-CM | POA: Diagnosis not present

## 2017-07-10 ENCOUNTER — Encounter: Payer: Self-pay | Admitting: Gastroenterology

## 2017-08-13 ENCOUNTER — Other Ambulatory Visit: Payer: Self-pay | Admitting: Internal Medicine

## 2017-11-24 ENCOUNTER — Other Ambulatory Visit: Payer: Self-pay | Admitting: Internal Medicine

## 2017-12-24 ENCOUNTER — Ambulatory Visit: Payer: Self-pay | Admitting: *Deleted

## 2017-12-24 ENCOUNTER — Ambulatory Visit: Payer: Medicare Other | Admitting: Family Medicine

## 2017-12-24 ENCOUNTER — Encounter: Payer: Self-pay | Admitting: Family Medicine

## 2017-12-24 ENCOUNTER — Telehealth: Payer: Self-pay | Admitting: Internal Medicine

## 2017-12-24 VITALS — BP 130/80 | HR 106 | Temp 98.9°F | Wt 228.7 lb

## 2017-12-24 DIAGNOSIS — K5792 Diverticulitis of intestine, part unspecified, without perforation or abscess without bleeding: Secondary | ICD-10-CM

## 2017-12-24 DIAGNOSIS — R1032 Left lower quadrant pain: Secondary | ICD-10-CM

## 2017-12-24 MED ORDER — CIPROFLOXACIN HCL 500 MG PO TABS: 500.0000 mg | ORAL_TABLET | Freq: Two times a day (BID) | ORAL | 0 refills | Status: DC

## 2017-12-24 MED ORDER — METRONIDAZOLE 500 MG PO TABS: 500.0000 mg | ORAL_TABLET | Freq: Three times a day (TID) | ORAL | 0 refills | Status: DC

## 2017-12-24 NOTE — Patient Instructions (Signed)
Diverticulitis °Diverticulitis is infection or inflammation of small pouches (diverticula) in the colon that form due to a condition called diverticulosis. Diverticula can trap stool (feces) and bacteria, causing infection and inflammation. °Diverticulitis may cause severe stomach pain and diarrhea. It may lead to tissue damage in the colon that causes bleeding. The diverticula may also burst (rupture) and cause infected stool to enter other areas of the abdomen. °Complications of diverticulitis can include: °· Bleeding. °· Severe infection. °· Severe pain. °· Rupture (perforation) of the colon. °· Blockage (obstruction) of the colon. ° °What are the causes? °This condition is caused by stool becoming trapped in the diverticula, which allows bacteria to grow in the diverticula. This leads to inflammation and infection. °What increases the risk? °You are more likely to develop this condition if: °· You have diverticulosis. The risk for diverticulosis increases if: °? You are overweight or obese. °? You use tobacco products. °? You do not get enough exercise. °· You eat a diet that does not include enough fiber. High-fiber foods include fruits, vegetables, beans, nuts, and whole grains. ° °What are the signs or symptoms? °Symptoms of this condition may include: °· Pain and tenderness in the abdomen. The pain is normally located on the left side of the abdomen, but it may occur in other areas. °· Fever and chills. °· Bloating. °· Cramping. °· Nausea. °· Vomiting. °· Changes in bowel routines. °· Blood in your stool. ° °How is this diagnosed? °This condition is diagnosed based on: °· Your medical history. °· A physical exam. °· Tests to make sure there is nothing else causing your condition. These tests may include: °? Blood tests. °? Urine tests. °? Imaging tests of the abdomen, including X-rays, ultrasounds, MRIs, or CT scans. ° °How is this treated? °Most cases of this condition are mild and can be treated at home.  Treatment may include: °· Taking over-the-counter pain medicines. °· Following a clear liquid diet. °· Taking antibiotic medicines by mouth. °· Rest. ° °More severe cases may need to be treated at a hospital. Treatment may include: °· Not eating or drinking. °· Taking prescription pain medicine. °· Receiving antibiotic medicines through an IV tube. °· Receiving fluids and nutrition through an IV tube. °· Surgery. ° °When your condition is under control, your health care provider may recommend that you have a colonoscopy. This is an exam to look at the entire large intestine. During the exam, a lubricated, bendable tube is inserted into the anus and then passed into the rectum, colon, and other parts of the large intestine. A colonoscopy can show how severe your diverticula are and whether something else may be causing your symptoms. °Follow these instructions at home: °Medicines °· Take over-the-counter and prescription medicines only as told by your health care provider. These include fiber supplements, probiotics, and stool softeners. °· If you were prescribed an antibiotic medicine, take it as told by your health care provider. Do not stop taking the antibiotic even if you start to feel better. °· Do not drive or use heavy machinery while taking prescription pain medicine. °General instructions °· Follow a full liquid diet or another diet as directed by your health care provider. After your symptoms improve, your health care provider may tell you to change your diet. He or she may recommend that you eat a diet that contains at least 25 g (25 grams) of fiber daily. Fiber makes it easier to pass stool. Healthy sources of fiber include: °? Berries. One cup   contains 4-8 grams of fiber. °? Beans or lentils. One half cup contains 5-8 grams of fiber. °? Green vegetables. One cup contains 4 grams of fiber. °· Exercise for at least 30 minutes, 3 times each week. You should exercise hard enough to raise your heart rate and  break a sweat. °· Keep all follow-up visits as told by your health care provider. This is important. You may need a colonoscopy. °Contact a health care provider if: °· Your pain does not improve. °· You have a hard time drinking or eating food. °· Your bowel movements do not return to normal. °Get help right away if: °· Your pain gets worse. °· Your symptoms do not get better with treatment. °· Your symptoms suddenly get worse. °· You have a fever. °· You vomit more than one time. °· You have stools that are bloody, black, or tarry. °Summary °· Diverticulitis is infection or inflammation of small pouches (diverticula) in the colon that form due to a condition called diverticulosis. Diverticula can trap stool (feces) and bacteria, causing infection and inflammation. °· You are at higher risk for this condition if you have diverticulosis and you eat a diet that does not include enough fiber. °· Most cases of this condition are mild and can be treated at home. More severe cases may need to be treated at a hospital. °· When your condition is under control, your health care provider may recommend that you have an exam called a colonoscopy. This exam can show how severe your diverticula are and whether something else may be causing your symptoms. °This information is not intended to replace advice given to you by your health care provider. Make sure you discuss any questions you have with your health care provider. °Document Released: 06/28/2005 Document Revised: 10/21/2016 Document Reviewed: 10/21/2016 °Elsevier Interactive Patient Education © 2018 [employer] ° °

## 2017-12-24 NOTE — Telephone Encounter (Signed)
[name] phoned stating she thinks she has had a flare up of diverticulitis due to some recent peanuts she ate. She began having diarrhea and Lower left sided abdomen pain around 3:00 am this morning. Having had approximately 15 stools, some with small amounts and some with almost no stool. Early on she had some vomiting which has resolved already. The last stool this morning she noted blood on the tissue. PCP is Dr. Raliegh Ip with no availability.   Reason for Disposition . Age > 60 years  Answer Assessment - Initial Assessment Questions 1. LOCATION: "Where does it hurt?"      Lower left side of abdomen 2. RADIATION: "Does the pain shoot anywhere else?" (e.g., chest, back)    no 3. ONSET: "When did the pain begin?" (e.g., minutes, hours or days ago)     3 AM diarrhea started then pain 4. SUDDEN: "Gradual or sudden onset?"    sudden 5. PATTERN "Does the pain come and go, or is it constant?"    - If constant: "Is it getting better, staying the same, or worsening?"      (Note: Constant means the pain never goes away completely; most serious pain is constant and it progresses)     - If intermittent: "How long does it last?" "Do you have pain now?"     (Note: Intermittent means the pain goes away completely between bouts)    3-4 at this time. Pain comes and goes. 6. SEVERITY: "How bad is the pain?"  (e.g., Scale 1-10; mild, moderate, or severe)   - MILD (1-3): doesn't interfere with normal activities, abdomen soft and not tender to touch    - MODERATE (4-7): interferes with normal activities or awakens from sleep, tender to touch    - SEVERE (8-10): excruciating pain, doubled over, unable to do any normal activities      Diarrhea during the night. 7. RECURRENT SYMPTOM: "Have you ever had this type of abdominal pain before?" If so, ask: "When was the last time?" and "What happened that time?"    A couple of times in the past.  8. CAUSE: "What do you think is causing the abdominal pain?"    Maybe  diverticulitis and ate some peanuts 9. RELIEVING/AGGRAVATING FACTORS: "What makes it better or worse?" (e.g., movement, antacids, bowel movement)     nothing 10. OTHER SYMPTOMS: "Has there been any vomiting, diarrhea, constipation, or urine problems?"      Vomiting but done with it this morning. Maybe 15 trips to the bathroom but some of those times nothing came out sometimes just a little stool. 11. PREGNANCY: "Is there any chance you are pregnant?" "When was your last menstrual period?"       none  Protocols used: ABDOMINAL PAIN - [facility]

## 2017-12-24 NOTE — Telephone Encounter (Signed)
Copied from [place] [id number]. Topic: Quick Communication - See Telephone Encounter >> Dec 24, 2017 11:14 AM Boyd Kerbs wrote: CRM for notification.   She is experiencing Diverticulitis and asking if can call in something for her.  Has had since 3am  See Telephone encounter for: 12/24/17.

## 2017-12-24 NOTE — Telephone Encounter (Signed)
Noted  

## 2017-12-24 NOTE — Progress Notes (Signed)
Subjective:     Patient ID: [name], female   DOB: [birth year], 67 y.o.   MRN: [id number]  HPI Patient seen with left lower quadrant abdominal pain. Woke up around 3 AM today with pain. Pain is moderate and somewhat intermittent. Similar to previous flareups with acute diverticulitis. She's had episodes in the past including confirmation by CT scan June/2015. Her last colonoscopy was November 2008.  Denies any associated fever. She had occasional loose stools yesterday and one stool that seemed slightly bloody. Denies any nausea or vomiting. No exacerbating or alleviating factors. She has had mostly liquids today and very little food intake.  She has apparently taken Flagyl and Cipro in the past without difficulty.  Past Medical History:  Diagnosis Date  . Acute bronchitis   . Backache, unspecified   . Diverticulosis of colon (without mention of hemorrhage)   . Edema   . Glaucoma   . Lumbago   . Obesity   . Other chest pain   . Pain in joint, ankle and foot   . Sarcoidosis   . Tibialis tendinitis   . Unspecified asthma(493.90)    Past Surgical History:  Procedure Laterality Date  . ABDOMINAL HYSTERECTOMY    . CATARACT EXTRACTION W/PHACO Right 06/24/2014   Procedure: CATARACT EXTRACTION PHACO AND INTRAOCULAR LENS PLACEMENT (IOC) RIGHT EYE WITH GONIOSYNECHIALYSIS;  Surgeon: Marylynn Pearson, MD;  Location: [facility];  Service: Ophthalmology;  Laterality: Right;  . COLONOSCOPY    . EYE SURGERY Left    cataract surgery  . HAND SURGERY    . KNEE ARTHROSCOPY Right     reports that she has never smoked. She has never used smokeless tobacco. She reports that she drinks about 0.6 oz of alcohol per week. She reports that she does not use drugs. family history includes Cancer in her brother; Diabetes in her father; Hypertension in her mother; Sarcoidosis in her sister; Stroke in her mother. Allergies  Allergen Reactions  . Ibuprofen     rash     Review of Systems  Constitutional:  Negative for chills and fever.  Respiratory: Negative for shortness of breath.   Cardiovascular: Negative for chest pain.  Gastrointestinal: Positive for abdominal pain and diarrhea. Negative for abdominal distention, constipation, nausea and vomiting.  Genitourinary: Negative for dysuria.  Musculoskeletal: Negative for back pain.       Objective:   Physical Exam  Constitutional: She appears well-developed and well-nourished.  Cardiovascular: Normal rate and regular rhythm.  Pulmonary/Chest: Effort normal and breath sounds normal. No respiratory distress. She has no wheezes. She has no rales.  Abdominal: Soft. Bowel sounds are normal. She exhibits no distension and no mass. There is tenderness. There is no rebound and no guarding.  Tender to palpation left lower quadrant. No guarding or rebound. No masses.       Assessment:     Abdominal pain left lower quadrant. Suspect recurrent episode of acute diverticulitis. Nonacute abdomen and afebrile currently    Plan:     -Start Cipro 500 mg twice daily and Flagyl 500 mg 3 times a day for 10 days -Follow-up immediately for any increasing fever, increased abdominal pain, or other concerns -Discuss with primary follow-up colonoscopy later this year after acute episode subsides  Eulas Post MD [facility] [place]

## 2017-12-27 ENCOUNTER — Other Ambulatory Visit: Payer: Self-pay | Admitting: Internal Medicine

## 2018-01-23 ENCOUNTER — Other Ambulatory Visit: Payer: Medicare Other

## 2018-01-23 ENCOUNTER — Encounter: Payer: Medicare Other | Admitting: Internal Medicine

## 2018-02-06 ENCOUNTER — Encounter: Payer: Self-pay | Admitting: Internal Medicine

## 2018-02-06 ENCOUNTER — Ambulatory Visit (INDEPENDENT_AMBULATORY_CARE_PROVIDER_SITE_OTHER): Payer: Medicare Other | Admitting: Internal Medicine

## 2018-02-06 VITALS — BP 122/68 | HR 95 | Temp 98.6°F | Ht 60.0 in | Wt 228.0 lb

## 2018-02-06 DIAGNOSIS — K5730 Diverticulosis of large intestine without perforation or abscess without bleeding: Secondary | ICD-10-CM | POA: Diagnosis not present

## 2018-02-06 DIAGNOSIS — Z0000 Encounter for general adult medical examination without abnormal findings: Secondary | ICD-10-CM

## 2018-02-06 DIAGNOSIS — E785 Hyperlipidemia, unspecified: Secondary | ICD-10-CM | POA: Diagnosis not present

## 2018-02-06 DIAGNOSIS — J4520 Mild intermittent asthma, uncomplicated: Secondary | ICD-10-CM | POA: Diagnosis not present

## 2018-02-06 LAB — CBC WITH DIFFERENTIAL/PLATELET
BASOS ABS: 0.1 10*3/uL (ref 0.0–0.1)
BASOS PCT: 1.3 % (ref 0.0–3.0)
EOS PCT: 4.4 % (ref 0.0–5.0)
Eosinophils Absolute: 0.2 10*3/uL (ref 0.0–0.7)
HEMATOCRIT: 39.6 % (ref 36.0–46.0)
Hemoglobin: 13 g/dL (ref 12.0–15.0)
LYMPHS ABS: 2 10*3/uL (ref 0.7–4.0)
LYMPHS PCT: 39.4 % (ref 12.0–46.0)
MCHC: 32.7 g/dL (ref 30.0–36.0)
MCV: 93.1 fl (ref 78.0–100.0)
MONOS PCT: 9.2 % (ref 3.0–12.0)
Monocytes Absolute: 0.5 10*3/uL (ref 0.1–1.0)
NEUTROS ABS: 2.3 10*3/uL (ref 1.4–7.7)
Neutrophils Relative %: 45.7 % (ref 43.0–77.0)
PLATELETS: 333 10*3/uL (ref 150.0–400.0)
RBC: 4.25 Mil/uL (ref 3.87–5.11)
RDW: 14.8 % (ref 11.5–15.5)
WBC: 5 10*3/uL (ref 4.0–10.5)

## 2018-02-06 LAB — LIPID PANEL
CHOL/HDL RATIO: 4
Cholesterol: 191 mg/dL (ref 0–200)
HDL: 54.5 mg/dL (ref >39.00)
LDL Cholesterol: 124 mg/dL — ABNORMAL HIGH (ref 0–99)
NONHDL: 136.51
TRIGLYCERIDES: 62 mg/dL (ref 0.0–149.0)
VLDL: 12.4 mg/dL (ref 0.0–40.0)

## 2018-02-06 LAB — COMPREHENSIVE METABOLIC PANEL
ALT: 11 U/L (ref 0–35)
AST: 15 U/L (ref 0–37)
Albumin: 3.8 g/dL (ref 3.5–5.2)
Alkaline Phosphatase: 70 U/L (ref 39–117)
BUN: 16 mg/dL (ref 6–23)
CALCIUM: 9.1 mg/dL (ref 8.4–10.5)
CO2: 27 meq/L (ref 19–32)
Chloride: 103 mEq/L (ref 96–112)
Creatinine, Ser: 0.7 mg/dL (ref 0.40–1.20)
GFR: 106.77 mL/min (ref >60.00)
GLUCOSE: 89 mg/dL (ref 70–99)
POTASSIUM: 4.5 meq/L (ref 3.5–5.1)
Sodium: 139 mEq/L (ref 135–145)
Total Bilirubin: 0.3 mg/dL (ref 0.2–1.2)
Total Protein: 6.7 g/dL (ref 6.0–8.3)

## 2018-02-06 LAB — TSH: TSH: 0.94 u[IU]/mL (ref 0.35–4.50)

## 2018-02-06 MED ORDER — METRONIDAZOLE 500 MG PO TABS: 500.0000 mg | ORAL_TABLET | Freq: Three times a day (TID) | ORAL | 0 refills | Status: DC

## 2018-02-06 MED ORDER — CIPROFLOXACIN HCL 500 MG PO TABS: 500.0000 mg | ORAL_TABLET | Freq: Two times a day (BID) | ORAL | 0 refills | Status: DC

## 2018-02-06 MED ORDER — LEVOFLOXACIN 750 MG PO TABS: 750.0000 mg | ORAL_TABLET | Freq: Every day | ORAL | 0 refills | Status: DC

## 2018-02-06 NOTE — Progress Notes (Signed)
Subjective:    Patient ID: [name], female    DOB: 03-02-1951, 67 y.o.   MRN: [id number]  HPI  67 year old patient who is seen today for a preventive health examination as well as a subsequent wellness visit. She has a history of diverticulosis and has been treated for acute diverticulitis over the past couple of weeks.  She was treated with Cipro and Flagyl and had a nice clinical response.  Over the past day or 2 she feels that she may be having a mild recurrence. She has a history of asthma which has been fairly well controlled on maintenance Advair.  She uses albuterol rescue. She has a prior history of sarcoidosis. She is followed by gynecology annually with annual mammograms  Past Surgical History: Hysterectomy hand surgery   Family History: father died age 64. History diabetes  Mother history of hypertension, os urethritis  One brother, history of prostate cancer  One sister, history of sarcoid  Social history.  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 Acute bronchitis   . Backache, unspecified   . Diverticulosis of colon (without mention of hemorrhage)   . Edema   . Glaucoma   . Lumbago   . Obesity   . Other chest pain   . Pain in joint, ankle and foot   . Sarcoidosis   . Tibialis tendinitis   . Unspecified asthma(493.90)      Social History   Socioeconomic History  . Marital status: Married    Spouse name: Not on file  . Number of children: Not on file  . Years of education: Not on file  . Highest education level: Not on file  Occupational History  . Not on file  Social Needs  . Financial resource strain: Not on file  . Food insecurity:    Worry: Not on file    Inability: Not on file  . Transportation needs:    Medical: Not on file    Non-medical: Not on file  Tobacco Use  . Smoking status: Never Smoker  . Smokeless tobacco: Never Used  Substance and Sexual  Activity  . Alcohol use: Yes    Alcohol/week: 0.6 oz    Types: 1 Standard drinks or equivalent per week  . Drug use: No  . Sexual activity: Not on file  Lifestyle  . Physical activity:    Days per week: Not on file    Minutes per session: Not on file  . Stress: Not on file  Relationships  . Social connections:    Talks on phone: Not on file    Gets together: Not on file    Attends religious service: Not on file    Active member of club or organization: Not on file    Attends meetings of clubs or organizations: Not on file    Relationship status: Not on file  . Intimate partner violence:    Fear of current or ex partner: Not on file    Emotionally abused: Not on file    Physically abused: Not on file    Forced sexual activity: Not on file  Other Topics Concern  . Not on file  Social History Narrative  . Not on file    Past Surgical History:  Procedure Laterality Date  . ABDOMINAL HYSTERECTOMY    . CATARACT EXTRACTION W/PHACO Right 06/24/2014   Procedure: CATARACT EXTRACTION PHACO AND INTRAOCULAR LENS PLACEMENT (IOC) RIGHT EYE WITH GONIOSYNECHIALYSIS;  Surgeon: Marylynn Pearson, MD;  Location: [facility];  Service: Ophthalmology;  Laterality: Right;  . COLONOSCOPY    . EYE SURGERY Left    cataract surgery  . HAND SURGERY    . KNEE ARTHROSCOPY Right     Family History  Problem Relation Age of Onset  . Hypertension Mother   . Stroke Mother   . Diabetes Father   . Sarcoidosis Sister   . Cancer Brother        prostate    Allergies  Allergen Reactions  . Ibuprofen     rash  . Sulfa Antibiotics Rash    Current Outpatient Medications on File Prior to Visit  Medication Sig Dispense Refill  . ADVAIR DISKUS 250-50 MCG/DOSE AEPB INHALE ONE (1) PUFF EVERY 12 HOURS AS DIRECTED. RINSE MOUTH AFTER EACH USE. 180 each 1  . albuterol (PROAIR HFA) 108 (90 Base) MCG/ACT inhaler Inhale 2 puffs into the lungs every 6 (six) hours as needed. 1 Inhaler 5  . celecoxib (CELEBREX) 200 MG  capsule TAKE ONE (1) CAPSULE BY MOUTH 2 TIMES DAILY 60 capsule 0  . estradiol (ESTRACE) 1 MG tablet Take 1 tablet (1 mg total) by mouth daily. 90 tablet 3  . fexofenadine-pseudoephedrine (ALLEGRA-D) 60-120 MG per tablet Take 1 tablet by mouth 2 (two) times daily between meals as needed.     Marland Kitchen FLUoxetine (PROZAC) 10 MG capsule Take 10 mg by mouth daily.    . furosemide (LASIX) 40 MG tablet Take 1 tablet (40 mg total) by mouth daily. prn 30 tablet 2  . Ginkgo Biloba (GINKOBA PO) Take 1 capsule by mouth daily.    . Multiple Vitamins-Minerals (MULTIVITAMIN WITH MINERALS) tablet Take 1 tablet by mouth daily.    Marland Kitchen omeprazole (PRILOSEC) 20 MG capsule Take 1 capsule (20 mg total) by mouth daily. 30 capsule 3  . ciprofloxacin (CIPRO) 500 MG tablet Take 1 tablet (500 mg total) by mouth 2 (two) times daily. (Patient not taking: Reported on 02/06/2018) 20 tablet 0   No current facility-administered medications on file prior to visit.     BP 122/68 (BP Location: Right Arm, Patient Position: Sitting, Cuff Size: Large)   Pulse 95   Temp 98.6 F (37 C) (Oral)   Ht 5' (1.524 m)   Wt 228 lb (103.4 kg)   SpO2 96%   BMI 44.53 kg/m     Subsequent Medicare wellness visit  1. Risk factors, based on past  M,S,F history.  Cardiovascular risk factors none  2.  Physical activities: Sedentary.  Still works a full-time job and is quite active  3.  Depression/mood: No history major depression or mood disorder  4.  Hearing: Diminished hearing from the right ear  5.  ADL's: Independent  6.  Fall risk: Low  7.  Home safety: No problems identified  8.  Height weight, and visual acuity; height and weight stable no change in visual acuity is status post bilateral cataract extraction surgery.  Is seen by ophthalmology annually  9.  Counseling: Weight loss and heart healthy diet encouraged  10. Lab orders based on risk factors: Laboratory update will be reviewed  11. Referral : We will obtain follow-up  colonoscopy later this summer after diverticular disease has resolved  12. Care plan: Continue efforts at aggressive risk factor modification weight loss increase physical activity encouraged  13. Cognitive assessment: Alert and appropriate with normal affect.  No cognitive dysfunction  14. Screening: Patient provided with a written and personalized 5-10 year screening  schedule in the AVS.    15. Provider List Update:  Primary care GI and gynecology as well as ophthalmology .  Review of Systems  Constitutional: Negative.   HENT: Negative for congestion, dental problem, hearing loss, rhinorrhea, sinus pressure, sore throat and tinnitus.   Eyes: Negative for pain, discharge and visual disturbance.  Respiratory: Negative for cough and shortness of breath.   Cardiovascular: Negative for chest pain, palpitations and leg swelling.  Gastrointestinal: Positive for abdominal pain. Negative for abdominal distention, blood in stool, constipation, diarrhea, nausea and vomiting.  Genitourinary: Negative for difficulty urinating, dysuria, flank pain, frequency, hematuria, pelvic pain, urgency, vaginal bleeding, vaginal discharge and vaginal pain.  Musculoskeletal: Negative for arthralgias, gait problem and joint swelling.  Skin: Negative for rash.  Neurological: Negative for dizziness, syncope, speech difficulty, weakness, numbness and headaches.  Hematological: Negative for adenopathy.  Psychiatric/Behavioral: Negative for agitation, behavioral problems and dysphoric mood. The patient is not nervous/anxious.        Objective:   Physical Exam  Constitutional: She is oriented to person, place, and time. She appears well-developed and well-nourished.  HENT:  Head: Normocephalic.  Right Ear: External ear normal.  Left Ear: External ear normal.  Mouth/Throat: Oropharynx is clear and moist.  Cerumen right canal  Eyes: Pupils are equal, round, and reactive to light. Conjunctivae and EOM are normal.    Neck: Normal range of motion. Neck supple. No thyromegaly present.  Cardiovascular: Normal rate, regular rhythm, normal heart sounds and intact distal pulses.  Pulmonary/Chest: Effort normal and breath sounds normal.  Abdominal: Soft. Bowel sounds are normal. She exhibits no mass. There is no tenderness.  No tenderness  Musculoskeletal: Normal range of motion.  Lymphadenopathy:    She has no cervical adenopathy.  Neurological: She is alert and oriented to person, place, and time.  Skin: Skin is warm and dry. No rash noted.  Psychiatric: She has a normal mood and affect. Her behavior is normal.          Assessment & Plan:   Preventive health examination Subsequent Medicare wellness visit Resolving acute diverticulitis  Preventive health.  We will schedule follow-up colonoscopy later this year Chronic asthma stable History of chronic low back pain.  Weight loss encouraged  Nyoka Cowden

## 2018-02-06 NOTE — Addendum Note (Signed)
Addended by: Bluford Kaufmann F on: 02/06/2018 12:22 PM   Modules accepted: Orders

## 2018-02-06 NOTE — Patient Instructions (Addendum)
Limit your sodium (Salt) intake  You need to lose weight.  Consider a lower calorie diet and regular exercise.    It is important that you exercise regularly, at least 20 minutes 3 to 4 times per week.  If you develop chest pain or shortness of breath seek  medical attention.  Schedule your colonoscopy to help detect colon cancer.  Resume antibiotic therapy for left sided abdominal pain intensifies or you develop fever   Health Maintenance for Postmenopausal Women Menopause is a normal process in which your reproductive ability comes to an end. This process happens gradually over a span of months to years, usually between the ages of 15 and 57. Menopause is complete when you have missed 12 consecutive menstrual periods. It is important to talk with your health care provider about some of the most common conditions that affect postmenopausal women, such as heart disease, cancer, and bone loss (osteoporosis). Adopting a healthy lifestyle and getting preventive care can help to promote your health and wellness. Those actions can also lower your chances of developing some of these common conditions. What should I know about menopause? During menopause, you may experience a number of symptoms, such as:  Moderate-to-severe hot flashes.  Night sweats.  Decrease in sex drive.  Mood swings.  Headaches.  Tiredness.  Irritability.  Memory problems.  Insomnia.  Choosing to treat or not to treat menopausal changes is an individual decision that you make with your health care provider. What should I know about hormone replacement therapy and supplements? Hormone therapy products are effective for treating symptoms that are associated with menopause, such as hot flashes and night sweats. Hormone replacement carries certain risks, especially as you become older. If you are thinking about using estrogen or estrogen with progestin treatments, discuss the benefits and risks with your health care  provider. What should I know about heart disease and stroke? Heart disease, heart attack, and stroke become more likely as you age. This may be due, in part, to the hormonal changes that your body experiences during menopause. These can affect how your body processes dietary fats, triglycerides, and cholesterol. Heart attack and stroke are both medical emergencies. There are many things that you can do to help prevent heart disease and stroke:  Have your blood pressure checked at least every 1-2 years. High blood pressure causes heart disease and increases the risk of stroke.  If you are 30-87 years old, ask your health care provider if you should take aspirin to prevent a heart attack or a stroke.  Do not use any tobacco products, including cigarettes, chewing tobacco, or electronic cigarettes. If you need help quitting, ask your health care provider.  It is important to eat a healthy diet and maintain a healthy weight. ? Be sure to include plenty of vegetables, fruits, low-fat dairy products, and lean protein. ? Avoid eating foods that are high in solid fats, added sugars, or salt (sodium).  Get regular exercise. This is one of the most important things that you can do for your health. ? Try to exercise for at least 150 minutes each week. The type of exercise that you do should increase your heart rate and make you sweat. This is known as moderate-intensity exercise. ? Try to do strengthening exercises at least twice each week. Do these in addition to the moderate-intensity exercise.  Know your numbers.Ask your health care provider to check your cholesterol and your blood glucose. Continue to have your blood tested as directed by  your health care provider.  What should I know about cancer screening? There are several types of cancer. Take the following steps to reduce your risk and to catch any cancer development as early as possible. Breast Cancer  Practice breast self-awareness. ? This  means understanding how your breasts normally appear and feel. ? It also means doing regular breast self-exams. Let your health care provider know about any changes, no matter how small.  If you are 11 or older, have a clinician do a breast exam (clinical breast exam or CBE) every year. Depending on your age, family history, and medical history, it may be recommended that you also have a yearly breast X-ray (mammogram).  If you have a family history of breast cancer, talk with your health care provider about genetic screening.  If you are at high risk for breast cancer, talk with your health care provider about having an MRI and a mammogram every year.  Breast cancer (BRCA) gene test is recommended for women who have family members with BRCA-related cancers. Results of the assessment will determine the need for genetic counseling and BRCA1 and for BRCA2 testing. BRCA-related cancers include these types: ? Breast. This occurs in males or females. ? Ovarian. ? Tubal. This may also be called fallopian tube cancer. ? Cancer of the abdominal or pelvic lining (peritoneal cancer). ? Prostate. ? Pancreatic.  Cervical, Uterine, and Ovarian Cancer Your health care provider may recommend that you be screened regularly for cancer of the pelvic organs. These include your ovaries, uterus, and vagina. This screening involves a pelvic exam, which includes checking for microscopic changes to the surface of your cervix (Pap test).  For women ages 21-65, health care providers may recommend a pelvic exam and a Pap test every three years. For women ages 5-65, they may recommend the Pap test and pelvic exam, combined with testing for human papilloma virus (HPV), every five years. Some types of HPV increase your risk of cervical cancer. Testing for HPV may also be done on women of any age who have unclear Pap test results.  Other health care providers may not recommend any screening for nonpregnant women who are  considered low risk for pelvic cancer and have no symptoms. Ask your health care provider if a screening pelvic exam is right for you.  If you have had past treatment for cervical cancer or a condition that could lead to cancer, you need Pap tests and screening for cancer for at least 20 years after your treatment. If Pap tests have been discontinued for you, your risk factors (such as having a new sexual partner) need to be reassessed to determine if you should start having screenings again. Some women have medical problems that increase the chance of getting cervical cancer. In these cases, your health care provider may recommend that you have screening and Pap tests more often.  If you have a family history of uterine cancer or ovarian cancer, talk with your health care provider about genetic screening.  If you have vaginal bleeding after reaching menopause, tell your health care provider.  There are currently no reliable tests available to screen for ovarian cancer.  Lung Cancer Lung cancer screening is recommended for adults 12-47 years old who are at high risk for lung cancer because of a history of smoking. A yearly low-dose CT scan of the lungs is recommended if you:  Currently smoke.  Have a history of at least 30 pack-years of smoking and you currently smoke or  have quit within the past 15 years. A pack-year is smoking an average of one pack of cigarettes per day for one year.  Yearly screening should:  Continue until it has been 15 years since you quit.  Stop if you develop a health problem that would prevent you from having lung cancer treatment.  Colorectal Cancer  This type of cancer can be detected and can often be prevented.  Routine colorectal cancer screening usually begins at age 8 and continues through age 30.  If you have risk factors for colon cancer, your health care provider may recommend that you be screened at an earlier age.  If you have a family history of  colorectal cancer, talk with your health care provider about genetic screening.  Your health care provider may also recommend using home test kits to check for hidden blood in your stool.  A small camera at the end of a tube can be used to examine your colon directly (sigmoidoscopy or colonoscopy). This is done to check for the earliest forms of colorectal cancer.  Direct examination of the colon should be repeated every 5-10 years until age 27. However, if early forms of precancerous polyps or small growths are found or if you have a family history or genetic risk for colorectal cancer, you may need to be screened more often.  Skin Cancer  Check your skin from head to toe regularly.  Monitor any moles. Be sure to tell your health care provider: ? About any new moles or changes in moles, especially if there is a change in a mole's shape or color. ? If you have a mole that is larger than the size of a pencil eraser.  If any of your family members has a history of skin cancer, especially at a young age, talk with your health care provider about genetic screening.  Always use sunscreen. Apply sunscreen liberally and repeatedly throughout the day.  Whenever you are outside, protect yourself by wearing long sleeves, pants, a wide-brimmed hat, and sunglasses.  What should I know about osteoporosis? Osteoporosis is a condition in which bone destruction happens more quickly than new bone creation. After menopause, you may be at an increased risk for osteoporosis. To help prevent osteoporosis or the bone fractures that can happen because of osteoporosis, the following is recommended:  If you are 49-38 years old, get at least 1,000 mg of calcium and at least 600 mg of vitamin D per day.  If you are older than age 37 but younger than age 64, get at least 1,200 mg of calcium and at least 600 mg of vitamin D per day.  If you are older than age 101, get at least 1,200 mg of calcium and at least 800 mg  of vitamin D per day.  Smoking and excessive alcohol intake increase the risk of osteoporosis. Eat foods that are rich in calcium and vitamin D, and do weight-bearing exercises several times each week as directed by your health care provider. What should I know about how menopause affects my mental health? Depression may occur at any age, but it is more common as you become older. Common symptoms of depression include:  Low or sad mood.  Changes in sleep patterns.  Changes in appetite or eating patterns.  Feeling an overall lack of motivation or enjoyment of activities that you previously enjoyed.  Frequent crying spells.  Talk with your health care provider if you think that you are experiencing depression. What should I know  about immunizations? It is important that you get and maintain your immunizations. These include:  Tetanus, diphtheria, and pertussis (Tdap) booster vaccine.  Influenza every year before the flu season begins.  Pneumonia vaccine.  Shingles vaccine.  Your health care provider may also recommend other immunizations. This information is not intended to replace advice given to you by your health care provider. Make sure you discuss any questions you have with your health care provider. Document Released: 11/10/2005 Document Revised: 04/07/2016 Document Reviewed: 06/22/2015 Elsevier Interactive Patient Education  2018 [employer].

## 2018-02-07 ENCOUNTER — Telehealth: Payer: Self-pay

## 2018-02-07 LAB — HEPATITIS C ANTIBODY
Hepatitis C Ab: NONREACTIVE
SIGNAL TO CUT-OFF: 0.13 (ref <1.00)

## 2018-02-18 NOTE — Telephone Encounter (Signed)
ok 

## 2018-02-25 ENCOUNTER — Other Ambulatory Visit: Payer: Self-pay | Admitting: Internal Medicine

## 2018-03-11 ENCOUNTER — Other Ambulatory Visit: Payer: Self-pay | Admitting: Internal Medicine

## 2018-03-11 ENCOUNTER — Encounter: Payer: Self-pay | Admitting: Physician Assistant

## 2018-03-21 ENCOUNTER — Ambulatory Visit: Payer: Medicare Other | Admitting: Physician Assistant

## 2018-03-21 ENCOUNTER — Encounter (INDEPENDENT_AMBULATORY_CARE_PROVIDER_SITE_OTHER): Payer: Self-pay

## 2018-03-21 ENCOUNTER — Encounter: Payer: Self-pay | Admitting: Physician Assistant

## 2018-03-21 VITALS — BP 132/78 | HR 104 | Ht 60.0 in | Wt 230.0 lb

## 2018-03-21 DIAGNOSIS — Z1211 Encounter for screening for malignant neoplasm of colon: Secondary | ICD-10-CM

## 2018-03-21 DIAGNOSIS — Z8719 Personal history of other diseases of the digestive system: Secondary | ICD-10-CM

## 2018-03-21 DIAGNOSIS — Z1212 Encounter for screening for malignant neoplasm of rectum: Secondary | ICD-10-CM | POA: Diagnosis not present

## 2018-03-21 NOTE — Progress Notes (Signed)
Chief Complaint: Screening for colorectal cancer, history of diverticulitis  HPI:    [name] is a 67 year old African-American female with a past medical history as listed below, follows with Dr. Ardis Hughs and who was referred to me by Marletta Lor, MD for a complaint of recent diverticulitis and need for screening colonoscopy.      08/07/2007 colonoscopy with multiple large diverticulum throughout the colon and otherwise normal.  Repeat recommended in 10 years.    02/06/2018 CBC and CMP were normal.    Today, explains that she has had 3 episodes of diverticulitis over the past 4 years (one documented via Ct 2015), most recently was treated a month ago with Ciprofloxacin and Flagyl for 2 weeks, this eased up her pain but she did continue with some lingering left lower quadrant pain and was given another week of Cipro which she finished about 3 weeks ago.  Tells me that now this is only 1/10 discomfort and is much much better.  Apparently she had a change in bowel habits towards loose stools and some bleeding at time of diverticulitis which has all resolved.    Patient requests July 29 for her colonoscopy if available.    Denies fever, chills, weight loss, anorexia, nausea, vomiting, heartburn, reflux or symptoms that awaken her at night.  Past Medical History:  Diagnosis Date  . Acute bronchitis   . Backache, unspecified   . Diverticulosis of colon (without mention of hemorrhage)   . Edema   . Glaucoma   . Lumbago   . Obesity   . Other chest pain   . Pain in joint, ankle and foot   . Sarcoidosis   . Tibialis tendinitis   . Unspecified asthma(493.90)     Past Surgical History:  Procedure Laterality Date  . ABDOMINAL HYSTERECTOMY    . CATARACT EXTRACTION W/PHACO Right 06/24/2014   Procedure: CATARACT EXTRACTION PHACO AND INTRAOCULAR LENS PLACEMENT (IOC) RIGHT EYE WITH GONIOSYNECHIALYSIS;  Surgeon: Marylynn Pearson, MD;  Location: [facility];  Service: Ophthalmology;  Laterality: Right;    . COLONOSCOPY    . EYE SURGERY Left    cataract surgery  . HAND SURGERY    . KNEE ARTHROSCOPY Right     Current Outpatient Medications  Medication Sig Dispense Refill  . albuterol (PROAIR HFA) 108 (90 Base) MCG/ACT inhaler Inhale 2 puffs into the lungs every 6 (six) hours as needed. 1 Inhaler 5  . celecoxib (CELEBREX) 200 MG capsule TAKE ONE (1) CAPSULE BY MOUTH 2 TIMES DAILY 60 capsule 0  . Condoms Latex Lubricated (ELEXA NATURAL FEEL) DEVI by Does not apply route.    Marland Kitchen estradiol (ESTRACE) 1 MG tablet Take 1 tablet (1 mg total) by mouth daily. 90 tablet 3  . fexofenadine-pseudoephedrine (ALLEGRA-D) 60-120 MG per tablet Take 1 tablet by mouth 2 (two) times daily between meals as needed.     Marland Kitchen FLUoxetine (PROZAC) 10 MG capsule Take 10 mg by mouth daily.    . furosemide (LASIX) 40 MG tablet Take 1 tablet (40 mg total) by mouth daily. prn 30 tablet 2  . Ginkgo Biloba (GINKOBA PO) Take 1 capsule by mouth daily.    . Multiple Vitamins-Minerals (MULTIVITAMIN WITH MINERALS) tablet Take 1 tablet by mouth daily.    Marland Kitchen omeprazole (PRILOSEC) 20 MG capsule Take 1 capsule (20 mg total) by mouth daily. 30 capsule 3  . WIXELA INHUB 250-50 MCG/DOSE AEPB INHALE ONE (1) PUFF EVERY 12 HOURS AS DIRECTED. RINSE MOUTH AFTER EACH USE. 180 each  1   No current facility-administered medications for this visit.     Allergies as of 03/21/2018 - Review Complete 03/21/2018  Allergen Reaction Noted  . Ibuprofen    . Sulfa antibiotics Rash 02/06/2018    Family History  Problem Relation Age of Onset  . Hypertension Mother   . Stroke Mother   . Diabetes Father   . Sarcoidosis Sister   . Cancer Brother        prostate    Social History   Socioeconomic History  . Marital status: Married    Spouse name: Not on file  . Number of children: Not on file  . Years of education: Not on file  . Highest education level: Not on file  Occupational History  . Not on file  Social Needs  . Financial resource  strain: Not on file  . Food insecurity:    Worry: Not on file    Inability: Not on file  . Transportation needs:    Medical: Not on file    Non-medical: Not on file  Tobacco Use  . Smoking status: Never Smoker  . Smokeless tobacco: Never Used  Substance and Sexual Activity  . Alcohol use: Yes    Alcohol/week: 0.6 oz    Types: 1 Standard drinks or equivalent per week  . Drug use: No  . Sexual activity: Not on file  Lifestyle  . Physical activity:    Days per week: Not on file    Minutes per session: Not on file  . Stress: Not on file  Relationships  . Social connections:    Talks on phone: Not on file    Gets together: Not on file    Attends religious service: Not on file    Active member of club or organization: Not on file    Attends meetings of clubs or organizations: Not on file    Relationship status: Not on file  . Intimate partner violence:    Fear of current or ex partner: Not on file    Emotionally abused: Not on file    Physically abused: Not on file    Forced sexual activity: Not on file  Other Topics Concern  . Not on file  Social History Narrative  . Not on file    Review of Systems:    Constitutional: No weight loss, fever or chills Skin: No rash Cardiovascular: No chest pain   Respiratory: No SOB  Gastrointestinal: See HPI and otherwise negative Genitourinary: No dysuria  Neurological: No headache, dizziness or syncope Musculoskeletal: No new muscle or joint pain Hematologic: No bruising Psychiatric: No history of depression or anxiety   Physical Exam:  Vital signs: BP 132/78   Pulse (!) 104   Ht 5' (1.524 m)   Wt 230 lb (104.3 kg)   BMI 44.92 kg/m   Constitutional:   Pleasant obese AA female appears to be in NAD, Well developed, Well nourished, alert and cooperative Head:  Normocephalic and atraumatic. Eyes:   PEERL, EOMI. No icterus. Conjunctiva pink. Ears:  Normal auditory acuity. Neck:  Supple Throat: Oral cavity and pharynx without  inflammation, swelling or lesion.  Respiratory: Respirations even and unlabored. Lungs clear to auscultation bilaterally.   No wheezes, crackles, or rhonchi.  Cardiovascular: Normal S1, S2. No MRG. Regular rate and rhythm. No peripheral edema, cyanosis or pallor.  Gastrointestinal:  Soft, nondistended, nontender. No rebound or guarding. Normal bowel sounds. No appreciable masses or hepatomegaly. Rectal:  Not performed.  Msk:  Symmetrical without  gross deformities. Without edema, no deformity or joint abnormality.  Neurologic:  Alert and  oriented x4;  grossly normal neurologically.  Skin:   Dry and intact without significant lesions or rashes. Psychiatric:  Demonstrates good judgement and reason without abnormal affect or behaviors.  MOST RECENT LABS AND IMAGING: CBC    Component Value Date/Time   WBC 5.0 02/06/2018 1246   RBC 4.25 02/06/2018 1246   HGB 13.0 02/06/2018 1246   HCT 39.6 02/06/2018 1246   PLT 333.0 02/06/2018 1246   MCV 93.1 02/06/2018 1246   MCH 30.1 06/24/2014 0928   MCHC 32.7 02/06/2018 1246   RDW 14.8 02/06/2018 1246   LYMPHSABS 2.0 02/06/2018 1246   MONOABS 0.5 02/06/2018 1246   EOSABS 0.2 02/06/2018 1246   BASOSABS 0.1 02/06/2018 1246    CMP     Component Value Date/Time   NA 139 02/06/2018 1246   K 4.5 02/06/2018 1246   CL 103 02/06/2018 1246   CO2 27 02/06/2018 1246   GLUCOSE 89 02/06/2018 1246   BUN 16 02/06/2018 1246   CREATININE 0.70 02/06/2018 1246   CALCIUM 9.1 02/06/2018 1246   PROT 6.7 02/06/2018 1246   ALBUMIN 3.8 02/06/2018 1246   AST 15 02/06/2018 1246   ALT 11 02/06/2018 1246   ALKPHOS 70 02/06/2018 1246   BILITOT 0.3 02/06/2018 1246   GFRNONAA 88 (L) 06/24/2014 0928   GFRAA >90 06/24/2014 0928    Assessment: 1.  Screening for colorectal cancer: Last colonoscopy in 2008 with recommendation to repeat in 10 years, patient is due now  2.  History of diverticulitis: 3 episodes over the past 4 years, 2015 documented via CT, recent  treatment a month ago with Cipro and Flagyl and another round of Cipro for lingering symptoms  Plan: 1.  Dr. Ardis Hughs schedule does not allow for an appointment on July 29.  Explained to patient that we could place her on a waiting list.  Patient was also told to call back in mid July to schedule a colonoscopy as there is nothing available today.  Discussed risk, benefits, limitations and alternatives and patient agrees to proceed. 2.  Discussed high-fiber diet with the patient.  This is the recommendation when she is not having any pain.  When she is having pain she needs to be on a low fiber diet.  Also increase water intake. 3.  Explained to patient that if she has any increase in left lower quadrant pain before time of colonoscopy she should call and let us know.  Recommend she have a CT of the abdomen pelvis with contrast for further evaluation of any further diverticulitis 4.  Patient to follow in clinic per recommendations from Dr. Ardis Hughs after time of procedure.  [name], PA-C [facility] 03/21/2018, 10:48 AM  Cc: Marletta Lor, MD

## 2018-03-21 NOTE — Patient Instructions (Addendum)
If you are age 69 or older, your body mass index should be between 23-30. Your Body mass index is 44.92 kg/m. If this is out of the aforementioned range listed, please consider follow up with your Primary Care Provider.  If you are age 54 or younger, your body mass index should be between 19-25. Your Body mass index is 44.92 kg/m. If this is out of the aformentioned range listed, please consider follow up with your Primary Care Provider.   Please call the office the second week of July to get colonoscopy scheduled with Dr. Ardis Hughs.   Thank you for choosing me and [facility].   [name], PA-C

## 2018-03-21 NOTE — Progress Notes (Signed)
I agree with the above note, plan 

## 2018-04-05 ENCOUNTER — Other Ambulatory Visit: Payer: Self-pay

## 2018-04-05 MED ORDER — CELECOXIB 200 MG PO CAPS: ORAL_CAPSULE | ORAL | 0 refills | Status: DC

## 2018-04-26 ENCOUNTER — Telehealth: Payer: Self-pay

## 2018-04-26 NOTE — Telephone Encounter (Signed)
Left message at patients work to return my call.  Was unable to leave messages on patients land/mobile lines.  Pt needs a colonoscopy with Dr. Ardis Hughs.  Pt was supposed to call the office back the 2nd week of July.  Did not hear from patient.  Dr. Ardis Hughs schedule is no longer available until Sept.

## 2018-05-20 ENCOUNTER — Ambulatory Visit (AMBULATORY_SURGERY_CENTER): Payer: Self-pay | Admitting: *Deleted

## 2018-05-20 VITALS — Ht 60.0 in | Wt 235.0 lb

## 2018-05-20 DIAGNOSIS — Z1211 Encounter for screening for malignant neoplasm of colon: Secondary | ICD-10-CM

## 2018-05-20 MED ORDER — PEG 3350-KCL-NA BICARB-NACL 420 G PO SOLR: 4000.0000 mL | Freq: Once | ORAL | 0 refills | Status: AC

## 2018-05-20 NOTE — Progress Notes (Signed)
No egg or soy allergy known to patient  No issues with past sedation with any surgeries  or procedures, no intubation problems  No diet pills per patient No home 02 use per patient  No blood thinners per patient  Pt denies issues with constipation  No A fib or A flutter  EMMI video sent to pt's e mail  

## 2018-05-29 ENCOUNTER — Ambulatory Visit (INDEPENDENT_AMBULATORY_CARE_PROVIDER_SITE_OTHER): Payer: Self-pay

## 2018-05-29 ENCOUNTER — Ambulatory Visit (INDEPENDENT_AMBULATORY_CARE_PROVIDER_SITE_OTHER): Payer: Medicare Other | Admitting: Orthopedic Surgery

## 2018-05-29 ENCOUNTER — Telehealth (INDEPENDENT_AMBULATORY_CARE_PROVIDER_SITE_OTHER): Payer: Self-pay | Admitting: Radiology

## 2018-05-29 ENCOUNTER — Encounter: Payer: Self-pay | Admitting: Gastroenterology

## 2018-05-29 ENCOUNTER — Encounter (INDEPENDENT_AMBULATORY_CARE_PROVIDER_SITE_OTHER): Payer: Self-pay | Admitting: Orthopedic Surgery

## 2018-05-29 VITALS — Ht 60.0 in | Wt 230.0 lb

## 2018-05-29 DIAGNOSIS — M25562 Pain in left knee: Secondary | ICD-10-CM

## 2018-05-29 DIAGNOSIS — M25561 Pain in right knee: Secondary | ICD-10-CM

## 2018-05-29 DIAGNOSIS — G8929 Other chronic pain: Secondary | ICD-10-CM

## 2018-05-29 DIAGNOSIS — M1711 Unilateral primary osteoarthritis, right knee: Secondary | ICD-10-CM

## 2018-05-29 DIAGNOSIS — M1712 Unilateral primary osteoarthritis, left knee: Secondary | ICD-10-CM

## 2018-05-29 MED ORDER — BUPIVACAINE HCL 0.25 % IJ SOLN
4.0000 mL | INTRAMUSCULAR | Status: AC | PRN
  Administered 2018-05-29: 4 mL via INTRA_ARTICULAR

## 2018-05-29 MED ORDER — LIDOCAINE HCL 1 % IJ SOLN
5.0000 mL | INTRAMUSCULAR | Status: AC | PRN
  Administered 2018-05-29: 5 mL

## 2018-05-29 MED ORDER — METHYLPREDNISOLONE ACETATE 40 MG/ML IJ SUSP
40.0000 mg | INTRAMUSCULAR | Status: AC | PRN
  Administered 2018-05-29: 40 mg via INTRA_ARTICULAR

## 2018-05-29 NOTE — Telephone Encounter (Signed)
Dr Marlou Sa would like to try and get approval for Bilateral Synvisc One Injections please.  He states once approved, we can tell patient we have approval and she can call when she wants them. Thanks.

## 2018-05-29 NOTE — Progress Notes (Signed)
Office Visit Note   Patient: [name]           Date of Birth: 12-20-1950           MRN: [id number] Visit Date: 05/29/2018 Requested by: Marletta Lor, MD [place], [place] [address] PCP: Marletta Lor, MD  Subjective: Chief Complaint  Patient presents with  . Right Knee - Pain  . Left Knee - Pain    HPI: [name] is a patient with known bilateral knee arthritis.  She is had an injection in the past.  Last injection right knee 2 years ago.  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  She denies any mechanical symptoms but just reports pain.  She would like to have the gel injections again.  BC powder is not giving her much relief.              ROS: All systems reviewed are negative as they relate to the chief complaint within the history of present illness.  Patient denies  fevers or chills.   Assessment & Plan: Visit Diagnoses:  1. Chronic pain of both knees   2. Unilateral primary osteoarthritis, left knee   3. Unilateral primary osteoarthritis, right knee     Plan: Impression is bilateral knee pain.  Plan is cortisone injection into the right knee today.  We will preapproved for for gel injection and then when that wears off in terms of the cortisone shot we will be able to inject gel into both knees.  I will see her back once this injection into the right knee wears off.  I think she is going to need knee replacement at some time in the future.  She is planning on working another year and a half.  Follow-Up Instructions: Return if symptoms worsen or fail to improve.   Orders:  Orders Placed This Encounter  Procedures  . XR KNEE 3 VIEW RIGHT  . XR KNEE 3 VIEW LEFT   No orders of the defined types were placed in this encounter.     Procedures: Large Joint Inj: R knee on 05/29/2018 3:08 PM Indications: diagnostic evaluation, joint swelling and pain Details: 18 G 1.5 in needle, superolateral approach  Arthrogram:  No  Medications: 5 mL lidocaine 1 %; 40 mg methylPREDNISolone acetate 40 MG/ML; 4 mL bupivacaine 0.25 % Outcome: tolerated well, no immediate complications Procedure, treatment alternatives, risks and benefits explained, specific risks discussed. Consent was given by the patient. Immediately prior to procedure a time out was called to verify the correct patient, procedure, equipment, support staff and site/side marked as required. Patient was prepped and draped in the usual sterile fashion.       Clinical Data: No additional findings.  Objective: Vital Signs: Ht 5' (1.524 m)   Wt 230 lb (104.3 kg)   BMI 44.92 kg/m   Physical Exam:   Constitutional: Patient appears well-developed HEENT:  Head: Normocephalic Eyes:EOM are normal Neck: Normal range of motion Cardiovascular: Normal rate Pulmonary/chest: Effort normal Neurologic: Patient is alert Skin: Skin is warm Psychiatric: Patient has normal mood and affect    Ortho Exam: Ortho exam demonstrates full active and passive range of motion of the ankles and hips.  Knees have full extension on the left lacking about 8 degrees of full extension on the right.  Flexion is to 90 on the right and about 110 on the left.  Pedal pulses palpable.  Varus alignment present more on  the right than the left.  No groin pain with internal/external rotation of the leg.  Patellofemoral crepitus is present.  Specialty Comments:  No specialty comments available.  Imaging: Xr Knee 3 View Left  Result Date: 05/29/2018 AP lateral merchant left knee reviewed.  Severe end-stage tricompartmental arthritis is present.  Mild varus alignment present.  No fracture or dislocation is seen.  Xr Knee 3 View Right  Result Date: 05/29/2018 AP lateral merchant right knee reviewed.  Severe end-stage tricompartmental arthritis is present worse on the medial side.  Varus alignment is present.  No fracture dislocation seen.    PMFS History: Patient Active Problem  List   Diagnosis Date Noted  . Fall in home 07/14/2015  . TIBIALIS TENDINITIS 08/25/2009  . CHEST PAIN, OTHER, PAIN 10/21/2008  . PEDAL EDEMA 07/22/2008  . ANKLE PAIN, BILATERAL 04/01/2008  . LOW BACK PAIN 04/01/2008  . ACUTE BRONCHITIS 09/23/2007  . SARCOIDOSIS 09/02/2007  . Asthma 09/02/2007  . Diverticulosis of colon 07/11/2007  . BACK PAIN, CHRONIC 07/11/2007   Past Medical History:  Diagnosis Date  . Acute bronchitis   . Allergy   . Anxiety   . Arthritis    knees  . Asthma   . Backache, unspecified   . Cataract    removed both eyes  . Depression   . Diverticulitis   . Diverticulosis of colon (without mention of hemorrhage)   . Edema   . Glaucoma   . Lumbago   . Obesity   . Other chest pain   . Pain in joint, ankle and foot   . Sarcoidosis   . Tibialis tendinitis   . Unspecified asthma(493.90)     Family History  Problem Relation Age of Onset  . Hypertension Mother   . Stroke Mother   . Diabetes Father   . Sarcoidosis Sister   . Cancer Brother        prostate  . Colon cancer Neg Hx   . Colon polyps Neg Hx   . Esophageal cancer Neg Hx   . Rectal cancer Neg Hx   . Stomach cancer Neg Hx     Past Surgical History:  Procedure Laterality Date  . ABDOMINAL HYSTERECTOMY    . CATARACT EXTRACTION W/PHACO Right 06/24/2014   Procedure: CATARACT EXTRACTION PHACO AND INTRAOCULAR LENS PLACEMENT (IOC) RIGHT EYE WITH GONIOSYNECHIALYSIS;  Surgeon: Marylynn Pearson, MD;  Location: [facility];  Service: Ophthalmology;  Laterality: Right;  . COLONOSCOPY    . EYE SURGERY Left    cataract surgery  . HAND SURGERY    . KNEE ARTHROSCOPY Right    Social History   Occupational History  . Not on file  Tobacco Use  . Smoking status: Never Smoker  . Smokeless tobacco: Never Used  Substance and Sexual Activity  . Alcohol use: Yes    Alcohol/week: 1.0 standard drinks    Types: 1 Standard drinks or equivalent per week  . Drug use: No  . Sexual activity: Not on file

## 2018-05-30 ENCOUNTER — Telehealth: Payer: Self-pay

## 2018-05-30 NOTE — Telephone Encounter (Signed)
Refill request for Furosemide 40 MG   Last OV 02/06/2018   Last refilled 09/11/2016 disp 30 with 2 refills   Sent to PCP to advise

## 2018-06-04 ENCOUNTER — Other Ambulatory Visit: Payer: Self-pay

## 2018-06-04 MED ORDER — FUROSEMIDE 40 MG PO TABS: 40.0000 mg | ORAL_TABLET | Freq: Every day | ORAL | 2 refills | Status: DC

## 2018-06-04 NOTE — Telephone Encounter (Signed)
Noted  

## 2018-06-04 NOTE — Telephone Encounter (Signed)
Refills have been sent.  

## 2018-06-04 NOTE — Telephone Encounter (Signed)
Okay for refill?  

## 2018-06-04 NOTE — Telephone Encounter (Signed)
Refilled per Dr. Burnice Logan. See phone encounter from 05/30/18

## 2018-06-06 ENCOUNTER — Other Ambulatory Visit: Payer: Self-pay

## 2018-06-07 ENCOUNTER — Telehealth (INDEPENDENT_AMBULATORY_CARE_PROVIDER_SITE_OTHER): Payer: Self-pay

## 2018-06-07 NOTE — Telephone Encounter (Signed)
Submitted VOB for SynviscOne, bilateral knee. 

## 2018-06-10 ENCOUNTER — Telehealth (INDEPENDENT_AMBULATORY_CARE_PROVIDER_SITE_OTHER): Payer: Self-pay

## 2018-06-10 NOTE — Telephone Encounter (Signed)
Talked with patient and advised her that she is approved for SynviscOne, bilateral knee. Buy & Bill Covered at 100% after co-pay $90.00 Co-pay No PA required  Appt.scheduled 06/11/2018

## 2018-06-11 ENCOUNTER — Ambulatory Visit (INDEPENDENT_AMBULATORY_CARE_PROVIDER_SITE_OTHER): Payer: Medicare Other | Admitting: Orthopedic Surgery

## 2018-06-11 ENCOUNTER — Encounter (INDEPENDENT_AMBULATORY_CARE_PROVIDER_SITE_OTHER): Payer: Self-pay | Admitting: Orthopedic Surgery

## 2018-06-11 DIAGNOSIS — M1712 Unilateral primary osteoarthritis, left knee: Secondary | ICD-10-CM | POA: Diagnosis not present

## 2018-06-11 DIAGNOSIS — M1711 Unilateral primary osteoarthritis, right knee: Secondary | ICD-10-CM | POA: Diagnosis not present

## 2018-06-12 ENCOUNTER — Encounter (INDEPENDENT_AMBULATORY_CARE_PROVIDER_SITE_OTHER): Payer: Self-pay | Admitting: Orthopedic Surgery

## 2018-06-12 ENCOUNTER — Encounter: Payer: Medicare Other | Admitting: Gastroenterology

## 2018-06-12 MED ORDER — LIDOCAINE HCL 1 % IJ SOLN
5.0000 mL | INTRAMUSCULAR | Status: AC | PRN
  Administered 2018-06-12: 5 mL

## 2018-06-12 MED ORDER — HYLAN G-F 20 48 MG/6ML IX SOSY
48.0000 mg | PREFILLED_SYRINGE | INTRA_ARTICULAR | Status: AC | PRN
  Administered 2018-06-12: 48 mg via INTRA_ARTICULAR

## 2018-06-12 NOTE — Progress Notes (Signed)
   Procedure Note  Patient: [name]             Date of Birth: Jun 01, 1951           MRN: [id number]             Visit Date: 06/11/2018  Procedures: Visit Diagnoses: Unilateral primary osteoarthritis, right knee  Unilateral primary osteoarthritis, left knee  Large Joint Inj: bilateral knee on 06/12/2018 5:05 PM Indications: pain, joint swelling and diagnostic evaluation Details: 18 G 1.5 in needle, superolateral approach  Arthrogram: No  Medications (Right): 5 mL lidocaine 1 %; 48 mg Hylan 48 MG/6ML Medications (Left): 5 mL lidocaine 1 %; 48 mg Hylan 48 MG/6ML Outcome: tolerated well, no immediate complications Procedure, treatment alternatives, risks and benefits explained, specific risks discussed. Consent was given by the patient. Immediately prior to procedure a time out was called to verify the correct patient, procedure, equipment, support staff and site/side marked as required. Patient was prepped and draped in the usual sterile fashion.

## 2018-06-20 ENCOUNTER — Other Ambulatory Visit: Payer: Self-pay | Admitting: Internal Medicine

## 2018-06-20 ENCOUNTER — Telehealth: Payer: Self-pay | Admitting: Internal Medicine

## 2018-06-20 NOTE — Telephone Encounter (Signed)
Okay for refill? Please advise 

## 2018-06-20 NOTE — Telephone Encounter (Signed)
Patient will need a follow-up office visit to determine whether antibiotic therapy is necessary

## 2018-06-20 NOTE — Telephone Encounter (Signed)
Called pt mobile and vm hs not been set up. Called pt home phone but no answer and no vm is set up to leave a message. Will try again another time.

## 2018-06-20 NOTE — Telephone Encounter (Signed)
Copied from [place] [id number]. Topic: Quick Communication - See Telephone Encounter >> Jun 20, 2018  9:10 AM Ivar Drape wrote: CRM for notification. See Telephone encounter for: 06/20/18. Patient was written two prescriptions for her Diverticulitis, Ciprofloxacin 500mg  and Metronidazole 500mg , but the patient has not had a chance to fill the prescriptions.  She is having construction done on her house and she packed up some items that went to a POD and she can't get to the prescriptions.  So she would like to have new prescriptions written and sent to the [facility] in [place].

## 2018-07-11 ENCOUNTER — Telehealth: Payer: Self-pay | Admitting: Physician Assistant

## 2018-07-11 DIAGNOSIS — R1032 Left lower quadrant pain: Secondary | ICD-10-CM

## 2018-07-11 DIAGNOSIS — K5730 Diverticulosis of large intestine without perforation or abscess without bleeding: Secondary | ICD-10-CM

## 2018-07-11 DIAGNOSIS — Z8719 Personal history of other diseases of the digestive system: Secondary | ICD-10-CM

## 2018-07-11 NOTE — Telephone Encounter (Signed)
Left side abd pain, no fever, loose stools, no blood.  She states this started about a month ago and has been off/on since.  She goes on a clear liquid diet and it resolves somewhat but always comes back.  She says she has a history of diverticulitis and has always taken cipro and flagyl.  Her PCP usually prescribes but her regular MD retired and she has not been assigned a new MD.  Please advise.

## 2018-07-11 NOTE — Telephone Encounter (Signed)
No answer no voice mail  

## 2018-07-11 NOTE — Telephone Encounter (Signed)
Pt called stating that she is having a diverticulitis flare up and needs some advise.

## 2018-07-12 ENCOUNTER — Other Ambulatory Visit (INDEPENDENT_AMBULATORY_CARE_PROVIDER_SITE_OTHER): Payer: Medicare Other

## 2018-07-12 ENCOUNTER — Ambulatory Visit (INDEPENDENT_AMBULATORY_CARE_PROVIDER_SITE_OTHER)
Admission: RE | Admit: 2018-07-12 | Discharge: 2018-07-12 | Disposition: A | Discharge status: Home / Self Care | Payer: Medicare Other | Source: Ambulatory Visit | Attending: Gastroenterology | Admitting: Gastroenterology

## 2018-07-12 DIAGNOSIS — Z8719 Personal history of other diseases of the digestive system: Secondary | ICD-10-CM | POA: Diagnosis not present

## 2018-07-12 DIAGNOSIS — R1032 Left lower quadrant pain: Secondary | ICD-10-CM

## 2018-07-12 DIAGNOSIS — K5730 Diverticulosis of large intestine without perforation or abscess without bleeding: Secondary | ICD-10-CM | POA: Diagnosis not present

## 2018-07-12 LAB — CBC WITH DIFFERENTIAL/PLATELET
BASOS PCT: 1.1 % (ref 0.0–3.0)
Basophils Absolute: 0.1 10*3/uL (ref 0.0–0.1)
EOS ABS: 0.4 10*3/uL (ref 0.0–0.7)
Eosinophils Relative: 6.7 % — ABNORMAL HIGH (ref 0.0–5.0)
HCT: 40.4 % (ref 36.0–46.0)
Hemoglobin: 13.1 g/dL (ref 12.0–15.0)
LYMPHS ABS: 2.6 10*3/uL (ref 0.7–4.0)
LYMPHS PCT: 48 % — AB (ref 12.0–46.0)
MCHC: 32.5 g/dL (ref 30.0–36.0)
MCV: 93.3 fl (ref 78.0–100.0)
Monocytes Absolute: 0.6 10*3/uL (ref 0.1–1.0)
Monocytes Relative: 10.7 % (ref 3.0–12.0)
NEUTROS ABS: 1.8 10*3/uL (ref 1.4–7.7)
NEUTROS PCT: 33.5 % — AB (ref 43.0–77.0)
PLATELETS: 332 10*3/uL (ref 150.0–400.0)
RBC: 4.33 Mil/uL (ref 3.87–5.11)
RDW: 14.8 % (ref 11.5–15.5)
WBC: 5.5 10*3/uL (ref 4.0–10.5)

## 2018-07-12 LAB — BUN: BUN: 11 mg/dL (ref 6–23)

## 2018-07-12 LAB — CREATININE, SERUM: CREATININE: 0.68 mg/dL (ref 0.40–1.20)

## 2018-07-12 MED ORDER — IOPAMIDOL (ISOVUE-300) INJECTION 61%
100.0000 mL | Freq: Once | INTRAVENOUS | Status: AC | PRN
  Administered 2018-07-12: 100 mL via INTRAVENOUS

## 2018-07-12 NOTE — Telephone Encounter (Signed)
[name] will call the pt to set up CT that works best for the pt.

## 2018-07-12 NOTE — Telephone Encounter (Signed)
Would like to document diveritculitis: she needs CT scan abd/pelvi with IV and oral contrast and CBC.  thanks

## 2018-07-15 ENCOUNTER — Telehealth: Payer: Self-pay | Admitting: Gastroenterology

## 2018-07-15 NOTE — Telephone Encounter (Signed)
The pt aware that the results have not been reviewed and we will call as soon as available.

## 2018-07-15 NOTE — Telephone Encounter (Signed)
Patient calling to see what next steps are since she had her CT done Friday 10.11.19. Patient requesting to speak with the nurse.

## 2018-07-17 ENCOUNTER — Other Ambulatory Visit: Payer: Self-pay

## 2018-07-17 DIAGNOSIS — Z8719 Personal history of other diseases of the digestive system: Secondary | ICD-10-CM

## 2018-07-17 DIAGNOSIS — R1032 Left lower quadrant pain: Secondary | ICD-10-CM

## 2018-07-17 DIAGNOSIS — K5792 Diverticulitis of intestine, part unspecified, without perforation or abscess without bleeding: Secondary | ICD-10-CM

## 2018-07-17 DIAGNOSIS — D1771 Benign lipomatous neoplasm of kidney: Secondary | ICD-10-CM

## 2018-07-17 MED ORDER — CIPROFLOXACIN HCL 500 MG PO TABS: 500.0000 mg | ORAL_TABLET | Freq: Two times a day (BID) | ORAL | 0 refills | Status: AC

## 2018-07-17 MED ORDER — METRONIDAZOLE 500 MG PO TABS: 500.0000 mg | ORAL_TABLET | Freq: Two times a day (BID) | ORAL | 0 refills | Status: AC

## 2018-07-17 NOTE — Telephone Encounter (Signed)
The pt has been advised and colon and previsit scheduled.  Referral to [facility] has been made.  Prescription sent and pt will call in about 12 days to update on her condition.

## 2018-07-18 ENCOUNTER — Encounter: Payer: Medicare Other | Admitting: Gastroenterology

## 2018-08-12 ENCOUNTER — Ambulatory Visit (AMBULATORY_SURGERY_CENTER): Payer: Self-pay

## 2018-08-12 ENCOUNTER — Telehealth: Payer: Self-pay

## 2018-08-12 ENCOUNTER — Encounter: Payer: Self-pay | Admitting: Gastroenterology

## 2018-08-12 VITALS — Ht 60.0 in | Wt 232.0 lb

## 2018-08-12 DIAGNOSIS — Z1211 Encounter for screening for malignant neoplasm of colon: Secondary | ICD-10-CM

## 2018-08-12 MED ORDER — PEG 3350-KCL-NA BICARB-NACL 420 G PO SOLR: 4000.0000 mL | Freq: Once | ORAL | 0 refills | Status: AC

## 2018-08-12 NOTE — Telephone Encounter (Signed)
Dr. Ardis Hughs,   I saw [name] for PV today. Patient states she had a diverticulitis flare in October. Diverticulitis was confirmed on CT. Patient is scheduled for a colonoscopy on 08/20/18. While in PV she states that she is still having symptoms of Diverticulitis. She completed the course of antibiotic therapy but states it usually takes two rounds of antibiotic therapy. Patient states one of the antibiotics gives her diarrhea and it is hard for her to take. Should this colonoscopy be cancelled and did you need to see her in the OV? Patient thinks that she should have another round of antibiotics. Please advise! Thanks.   Victoria Sheer, LPN ( PV )

## 2018-08-12 NOTE — Progress Notes (Signed)
Denies allergies to eggs or soy products. Denies complication of anesthesia or sedation. Denies use of weight loss medication. Denies use of O2.   Emmi instructions declined.   Patient states that she is still having symptoms of diverticulitis. Patient completed one round of Cipro and Metronidrozole. Patient states it usually takes two rounds of antibiotic therapy before it gets better.  I will proceed with PV but will send a note to Dr. Ardis Hughs. Patient will be called if he wants to post-pone her procedure.  Patient already had picked up her prep prior to the last time she was scheduled.

## 2018-08-13 ENCOUNTER — Other Ambulatory Visit: Payer: Self-pay | Admitting: Internal Medicine

## 2018-08-13 MED ORDER — METRONIDAZOLE 500 MG PO TABS: 500.0000 mg | ORAL_TABLET | Freq: Two times a day (BID) | ORAL | 0 refills | Status: AC

## 2018-08-13 MED ORDER — CIPROFLOXACIN HCL 500 MG PO TABS: 500.0000 mg | ORAL_TABLET | Freq: Two times a day (BID) | ORAL | 0 refills | Status: AC

## 2018-08-13 NOTE — Telephone Encounter (Signed)
Spoke to pt and scheduled her with Dr.Banks for TOC. Rx will be refilled. Further refills will need to come from new PCP if appropriate

## 2018-08-13 NOTE — Telephone Encounter (Signed)
appt for colon was cancelled and prescription sent to the pharmacy.  Colon was rescheduled to mid December.  She will call if her symptoms do not resolve

## 2018-08-13 NOTE — Telephone Encounter (Signed)
  [name], please cancel her upcoming colonoscopy since she is still having diverticulitis symptoms it would not be safe.   [name], please call her and explain the above.  She needs Cipro 500 mg pills 1 pill twice daily for 10 days also Flagyl 500 mg pills 1 pill twice daily for 10 days.  Let us go ahead and reschedule her for colonoscopy in mid December.  Thanks

## 2018-08-20 ENCOUNTER — Encounter: Payer: Medicare Other | Admitting: Gastroenterology

## 2018-08-21 ENCOUNTER — Ambulatory Visit: Payer: Medicare Other | Admitting: Family Medicine

## 2018-08-21 ENCOUNTER — Encounter: Payer: Self-pay | Admitting: Family Medicine

## 2018-08-21 VITALS — BP 140/80 | HR 96 | Temp 98.3°F | Wt 229.0 lb

## 2018-08-21 DIAGNOSIS — J4530 Mild persistent asthma, uncomplicated: Secondary | ICD-10-CM

## 2018-08-21 DIAGNOSIS — D869 Sarcoidosis, unspecified: Secondary | ICD-10-CM

## 2018-08-21 DIAGNOSIS — G8929 Other chronic pain: Secondary | ICD-10-CM

## 2018-08-21 DIAGNOSIS — M25561 Pain in right knee: Secondary | ICD-10-CM

## 2018-08-21 DIAGNOSIS — K5730 Diverticulosis of large intestine without perforation or abscess without bleeding: Secondary | ICD-10-CM | POA: Diagnosis not present

## 2018-08-21 DIAGNOSIS — R011 Cardiac murmur, unspecified: Secondary | ICD-10-CM

## 2018-08-21 NOTE — Patient Instructions (Signed)
Sarcoidosis Sarcoidosis is a disease that causes inflammation in your organs and other areas of your body. The lungs are most often affected (pulmonary sarcoidosis). Sarcoidosis can also affect your lymph nodes, liver, eyes, skin, or any other body tissue. When you have sarcoidosis, small clumps of tissue (granulomas) form in the affected area of your body. Granulomas are made up of your body's defense (immune) cells. Inflammation results when your body reacts to a harmful substance. Normally, inflammation goes away after immune cells get rid of the harmful substance. In sarcoidosis, the immune cells form granulomas instead. What are the causes? The exact cause of sarcoidosis is not known. Something triggers the immune system to respond, such as dust, chemicals, bacteria, or a virus. What increases the risk? You may be at a greater risk for sarcoidosis if you:  Have a family history of the disease.  Are African American.  Are of Northern European ancestry.  Are 53-91 years old.  Are female.  What are the signs or symptoms? Many people with sarcoidosis have no symptoms. Others have very mild symptoms. Sarcoidosis most often affects the lungs. Symptoms include:  Chest pain.  Coughing.  Wheezing.  Shortness of breath.  Other common symptoms include:  Night sweats.  Weight loss.  Fatigue.  Depression.  A sense of uneasiness.  How is this diagnosed? Sarcoidosis may be diagnosed by:  Medical history and physical exam.  Chest X-ray. This looks for granulomas in your lungs.  Lung function tests. These measure your breathing and look for problems related to sarcoidosis.  Examining a sample of tissue under a microscope (biopsy).  How is this treated? Sarcoidosis usually clears up without treatment. You may take medicines to reduce inflammation or relieve symptoms. These may include:  Prednisone. This steroid reduces inflammation related to sarcoidosis.  Chloroquine or  hydroxychloroquine. These are antimalarial medicines used to treat sarcoidosis that affects the skin or brain.  Methotrexate, leflunomide, or azathioprine. These medicines affect the immune system and can help with sarcoidosis in the joints, eyes, skin, or lungs.  Inhalers. Inhaled medicines can help you breathe if sarcoidosis is affecting your lungs.  Follow these instructions at home:  Do not use any tobacco products, including cigarettes, chewing tobacco, or electronic cigarettes. If you need help quitting, ask your health care provider.  Avoid secondhand smoke.  Avoid irritating dust and chemicals. Stay indoors on days when air quality is poor in your area.  Take medicines only as directed by your health care provider. Contact a health care provider if:  You have vision problems.  You have shortness of breath.  You have a dry, persistent cough.  You have an irregular heartbeat.  You have pain or ache in your joints, hands, or feet.  You have an unexplained rash. Get help right away if: You have chest pain. This information is not intended to replace advice given to you by your health care provider. Make sure you discuss any questions you have with your health care provider. Document Released: 07/19/2004 Document Revised: 02/24/2016 Document Reviewed: 01/14/2014 Elsevier Interactive Patient Education  2018 [employer].  Diverticulosis Diverticulosis is a condition that develops when small pouches (diverticula) form in the wall of the large intestine (colon). The colon is where water is absorbed and stool is formed. The pouches form when the inside layer of the colon pushes through weak spots in the outer layers of the colon. You may have a few pouches or many of them. What are the causes? The cause of  this condition is not known. What increases the risk? The following factors may make you more likely to develop this condition:  Being older than age 55. Your risk for this  condition increases with age. Diverticulosis is rare among people younger than age 25. By age 29, many people have it.  Eating a low-fiber diet.  Having frequent constipation.  Being overweight.  Not getting enough exercise.  Smoking.  Taking over-the-counter pain medicines, like aspirin and ibuprofen.  Having a family history of diverticulosis.  What are the signs or symptoms? In most people, there are no symptoms of this condition. If you do have symptoms, they may include:  Bloating.  Cramps in the abdomen.  Constipation or diarrhea.  Pain in the lower left side of the abdomen.  How is this diagnosed? This condition is most often diagnosed during an exam for other colon problems. Because diverticulosis usually has no symptoms, it often cannot be diagnosed independently. This condition may be diagnosed by:  Using a flexible scope to examine the colon (colonoscopy).  Taking an X-ray of the colon after dye has been put into the colon (barium enema).  Doing a CT scan.  How is this treated? You may not need treatment for this condition if you have never developed an infection related to diverticulosis. If you have had an infection before, treatment may include:  Eating a high-fiber diet. This may include eating more fruits, vegetables, and grains.  Taking a fiber supplement.  Taking a live bacteria supplement (probiotic).  Taking medicine to relax your colon.  Taking antibiotic medicines.  Follow these instructions at home:  Drink 6-8 glasses of water or more each day to prevent constipation.  Try not to strain when you have a bowel movement.  If you have had an infection before: ? Eat more fiber as directed by your health care provider or your diet and nutrition specialist (dietitian). ? Take a fiber supplement or probiotic, if your health care provider approves.  Take over-the-counter and prescription medicines only as told by your health care  provider.  If you were prescribed an antibiotic, take it as told by your health care provider. Do not stop taking the antibiotic even if you start to feel better.  Keep all follow-up visits as told by your health care provider. This is important. Contact a health care provider if:  You have pain in your abdomen.  You have bloating.  You have cramps.  You have not had a bowel movement in 3 days. Get help right away if:  Your pain gets worse.  Your bloating becomes very bad.  You have a fever or chills, and your symptoms suddenly get worse.  You vomit.  You have bowel movements that are bloody or black.  You have bleeding from your rectum. Summary  Diverticulosis is a condition that develops when small pouches (diverticula) form in the wall of the large intestine (colon).  You may have a few pouches or many of them.  This condition is most often diagnosed during an exam for other colon problems.  If you have had an infection related to diverticulosis, treatment may include increasing the fiber in your diet, taking supplements, or taking medicines. This information is not intended to replace advice given to you by your health care provider. Make sure you discuss any questions you have with your health care provider. Document Released: 06/15/2004 Document Revised: 08/07/2016 Document Reviewed: 08/07/2016 Elsevier Interactive Patient Education  2017 [place].  Asthma, Adult Asthma  is a recurring condition in which the airways tighten and narrow. Asthma can make it difficult to breathe. It can cause coughing, wheezing, and shortness of breath. Asthma episodes, also called asthma attacks, range from minor to life-threatening. Asthma cannot be cured, but medicines and lifestyle changes can help control it. What are the causes? Asthma is believed to be caused by inherited (genetic) and environmental factors, but its exact cause is unknown. Asthma may be triggered by allergens,  lung infections, or irritants in the air. Asthma triggers are different for each person. Common triggers include:  Animal dander.  Dust mites.  Cockroaches.  Pollen from trees or grass.  Mold.  Smoke.  Air pollutants such as dust, household cleaners, hair sprays, aerosol sprays, paint fumes, strong chemicals, or strong odors.  Cold air, weather changes, and winds (which increase molds and pollens in the air).  Strong emotional expressions such as crying or laughing hard.  Stress.  Certain medicines (such as aspirin) or types of drugs (such as beta-blockers).  Sulfites in foods and drinks. Foods and drinks that may contain sulfites include dried fruit, potato chips, and sparkling grape juice.  Infections or inflammatory conditions such as the flu, a cold, or an inflammation of the nasal membranes (rhinitis).  Gastroesophageal reflux disease (GERD).  Exercise or strenuous activity.  What are the signs or symptoms? Symptoms may occur immediately after asthma is triggered or many hours later. Symptoms include:  Wheezing.  Excessive nighttime or early morning coughing.  Frequent or severe coughing with a common cold.  Chest tightness.  Shortness of breath.  How is this diagnosed? The diagnosis of asthma is made by a review of your medical history and a physical exam. Tests may also be performed. These may include:  Lung function studies. These tests show how much air you breathe in and out.  Allergy tests.  Imaging tests such as X-rays.  How is this treated? Asthma cannot be cured, but it can usually be controlled. Treatment involves identifying and avoiding your asthma triggers. It also involves medicines. There are 2 classes of medicine used for asthma treatment:  Controller medicines. These prevent asthma symptoms from occurring. They are usually taken every day.  Reliever or rescue medicines. These quickly relieve asthma symptoms. They are used as needed and  provide short-term relief.  Your health care provider will help you create an asthma action plan. An asthma action plan is a written plan for managing and treating your asthma attacks. It includes a list of your asthma triggers and how they may be avoided. It also includes information on when medicines should be taken and when their dosage should be changed. An action plan may also involve the use of a device called a peak flow meter. A peak flow meter measures how well the lungs are working. It helps you monitor your condition. Follow these instructions at home:  Take medicines only as directed by your health care provider. Speak with your health care provider if you have questions about how or when to take the medicines.  Use a peak flow meter as directed by your health care provider. Record and keep track of readings.  Understand and use the action plan to help minimize or stop an asthma attack without needing to seek medical care.  Control your home environment in the following ways to help prevent asthma attacks: ? Do not smoke. Avoid being exposed to secondhand smoke. ? Change your heating and air conditioning filter regularly. ? Limit your use  of fireplaces and wood stoves. ? Get rid of pests (such as roaches and mice) and their droppings. ? Throw away plants if you see mold on them. ? Clean your floors and dust regularly. Use unscented cleaning products. ? Try to have someone else vacuum for you regularly. Stay out of rooms while they are being vacuumed and for a short while afterward. If you vacuum, use a dust mask from a hardware store, a double-layered or microfilter vacuum cleaner bag, or a vacuum cleaner with a HEPA filter. ? Replace carpet with wood, tile, or vinyl flooring. Carpet can trap dander and dust. ? Use allergy-proof pillows, mattress covers, and box spring covers. ? Wash bed sheets and blankets every week in hot water and dry them in a dryer. ? Use blankets that are  made of polyester or cotton. ? Clean bathrooms and kitchens with bleach. If possible, have someone repaint the walls in these rooms with mold-resistant paint. Keep out of the rooms that are being cleaned and painted. ? Wash hands frequently. Contact a health care provider if:  You have wheezing, shortness of breath, or a cough even if taking medicine to prevent attacks.  The colored mucus you cough up (sputum) is thicker than usual.  Your sputum changes from clear or white to yellow, green, gray, or bloody.  You have any problems that may be related to the medicines you are taking (such as a rash, itching, swelling, or trouble breathing).  You are using a reliever medicine more than 2-3 times per week.  Your peak flow is still at 50-79% of your personal best after following your action plan for 1 hour.  You have a fever. Get help right away if:  You seem to be getting worse and are unresponsive to treatment during an asthma attack.  You are short of breath even at rest.  You get short of breath when doing very little physical activity.  You have difficulty eating, drinking, or talking due to asthma symptoms.  You develop chest pain.  You develop a fast heartbeat.  You have a bluish color to your lips or fingernails.  You are light-headed, dizzy, or faint.  Your peak flow is less than 50% of your personal best. This information is not intended to replace advice given to you by your health care provider. Make sure you discuss any questions you have with your health care provider. Document Released: 09/18/2005 Document Revised: 03/01/2016 Document Reviewed: 04/17/2013 Elsevier Interactive Patient Education  2017 [employer].

## 2018-08-21 NOTE — Progress Notes (Signed)
Subjective:    Patient ID: [name], female    DOB: Jan 12, 1951, 67 y.o.   MRN: [id number]  No chief complaint on file.   HPI Patient was seen today for follow-up on chronic conditions and TOC, previously seen by Dr. Burnice Logan.  Sarcoidosis: -In remission -Noted years ago when wanted to donate her kidney to her husband  Asthma: -Advair BID, albuterol prn  Pain in R knee: -seen by Dr. Marlou Sa -gets steroid injection  H/o cataracts and glaucoma: -followed by eye doctor -using restasis eye gtts  H/o diverticulosis: -endorses episodes of diverticuitis   Past Medical History:  Diagnosis Date  . Acute bronchitis   . Allergy   . Anxiety   . Arthritis    knees  . Asthma   . Backache, unspecified   . Cataract    removed both eyes  . Depression   . Diverticulitis   . Diverticulosis of colon (without mention of hemorrhage)   . Edema   . Glaucoma   . Lumbago   . Obesity   . Other chest pain   . Pain in joint, ankle and foot   . Sarcoidosis   . Tibialis tendinitis   . Unspecified asthma(493.90)     Allergies  Allergen Reactions  . Ibuprofen     rash  . Sulfa Antibiotics Rash    ROS General: Denies fever, chills, night sweats, changes in weight, changes in appetite HEENT: Denies headaches, ear pain, changes in vision, rhinorrhea, sore throat CV: Denies CP, palpitations, SOB, orthopnea Pulm: Denies SOB, cough, wheezing GI: Denies abdominal pain, nausea, vomiting, diarrhea, constipation GU: Denies dysuria, hematuria, frequency, vaginal discharge Msk: Denies muscle cramps, joint pains  +R knee pain Neuro: Denies weakness, numbness, tingling Skin: Denies rashes, bruising Psych: Denies depression, anxiety, hallucinations    Objective:    Blood pressure 140/80, pulse 96, temperature 98.3 F (36.8 C), temperature source Oral, weight 229 lb (103.9 kg), SpO2 98 %.  Gen. Pleasant, well-nourished, in no distress, normal affect   HEENT: [place]/AT, face symmetric,  no scleral icterus, PERRLA, arcus senilis, nares patent without drainage, pharynx without erythema or exudate. Lungs: no accessory muscle use, CTAB, no wheezes or rales Cardiovascular: RRR, 1/6 murmur best hears in LUSB, no peripheral edema Neuro:  A&Ox3, CN II-XII intact, normal gait Skin:  Warm, no lesions/ rash   Wt Readings from Last 3 Encounters:  08/21/18 229 lb (103.9 kg)  08/12/18 232 lb (105.2 kg)  05/29/18 230 lb (104.3 kg)    Lab Results  Component Value Date   WBC 5.5 07/12/2018   HGB 13.1 07/12/2018   HCT 40.4 07/12/2018   PLT 332.0 07/12/2018   GLUCOSE 89 02/06/2018   CHOL 191 02/06/2018   TRIG 62.0 02/06/2018   HDL 54.50 02/06/2018   LDLCALC 124 (H) 02/06/2018   ALT 11 02/06/2018   AST 15 02/06/2018   NA 139 02/06/2018   K 4.5 02/06/2018   CL 103 02/06/2018   CREATININE 0.68 07/12/2018   BUN 11 07/12/2018   CO2 27 02/06/2018   TSH 0.94 02/06/2018    Assessment/Plan:  Mild persistent asthma without complication -controlled -continue Advair BID and albuterol prn  Diverticulosis of colon -stable  Sarcoidosis -stable  Chronic pain of right knee -continue f/u with Ortho, Dr. Marlou Sa  Murmur -1/6 -will continue to monitor -consider ECHO  F/u prn  Grier Mitts, MD

## 2018-08-24 ENCOUNTER — Encounter: Payer: Self-pay | Admitting: Family Medicine

## 2018-08-24 DIAGNOSIS — M25569 Pain in unspecified knee: Secondary | ICD-10-CM | POA: Insufficient documentation

## 2018-08-24 DIAGNOSIS — M25561 Pain in right knee: Secondary | ICD-10-CM

## 2018-08-24 DIAGNOSIS — G8929 Other chronic pain: Secondary | ICD-10-CM | POA: Insufficient documentation

## 2018-08-26 ENCOUNTER — Other Ambulatory Visit: Payer: Self-pay | Admitting: Internal Medicine

## 2018-09-18 ENCOUNTER — Telehealth: Payer: Self-pay | Admitting: Gastroenterology

## 2018-09-18 NOTE — Telephone Encounter (Signed)
Pt notified she may take Xyzal and cough medicine with no issues as long as taken by 11 am FRI - also instructed to call if she runs fever 100 or >.   She needed new Emmi instructions and also new prep instructions for FRI - Pt will need to pick these up as we cannot email and she has no MyChart . Pt will pick up these 3rd floor, front desk today  Encouraged to call with Questions  [name] PV

## 2018-09-20 ENCOUNTER — Encounter: Payer: Self-pay | Admitting: Gastroenterology

## 2018-09-20 ENCOUNTER — Ambulatory Visit (AMBULATORY_SURGERY_CENTER): Payer: Medicare Other | Admitting: Gastroenterology

## 2018-09-20 VITALS — BP 126/78 | HR 94 | Temp 97.8°F | Resp 12 | Ht 60.0 in | Wt 229.0 lb

## 2018-09-20 DIAGNOSIS — Z1211 Encounter for screening for malignant neoplasm of colon: Secondary | ICD-10-CM | POA: Diagnosis present

## 2018-09-20 DIAGNOSIS — K5730 Diverticulosis of large intestine without perforation or abscess without bleeding: Secondary | ICD-10-CM

## 2018-09-20 MED ORDER — SODIUM CHLORIDE 0.9 % IV SOLN: 500.0000 mL | Freq: Once | INTRAVENOUS | Status: DC

## 2018-09-20 NOTE — Op Note (Signed)
[facility] Patient Name: [name] Procedure Date: 09/20/2018 1:43 PM MRN: [id number] Endoscopist: Milus Banister , MD Age: 67 Referring MD:  Date of Birth: Nov 04, 1950 Gender: Female Account #: [id number] Procedure:                Colonoscopy Indications:              Screening for colorectal malignant neoplasm Medicines:                Monitored Anesthesia Care Procedure:                Pre-Anesthesia Assessment:                           - Prior to the procedure, a History and Physical                            was performed, and patient medications and                            allergies were reviewed. The patient's tolerance of                            previous anesthesia was also reviewed. The risks                            and benefits of the procedure and the sedation                            options and risks were discussed with the patient.                            All questions were answered, and informed consent                            was obtained. Prior Anticoagulants: The patient has                            taken no previous anticoagulant or antiplatelet                            agents. ASA Grade Assessment: II - A patient with                            mild systemic disease. After reviewing the risks                            and benefits, the patient was deemed in                            satisfactory condition to undergo the procedure.                           After obtaining informed consent, the colonoscope  was passed under direct vision. Throughout the                            procedure, the patient's blood pressure, pulse, and                            oxygen saturations were monitored continuously. The                            Colonoscope was introduced through the anus and                            advanced to the the cecum, identified by                            appendiceal orifice and  ileocecal valve. The                            colonoscopy was performed without difficulty. The                            patient tolerated the procedure well. The quality                            of the bowel preparation was good. The ileocecal                            valve, appendiceal orifice, and rectum were                            photographed. Scope In: 1:49:47 PM Scope Out: 2:00:09 PM Scope Withdrawal Time: 0 hours 6 minutes 51 seconds  Total Procedure Duration: 0 hours 10 minutes 22 seconds  Findings:                 Multiple small and large-mouthed diverticula were                            found in the entire colon.                           The exam was otherwise without abnormality on                            direct and retroflexion views. Complications:            No immediate complications. Estimated blood loss:                            None. Estimated Blood Loss:     Estimated blood loss: none. Impression:               - Diverticulosis in the entire examined colon.                           - The examination was otherwise normal on direct  and retroflexion views.                           - No polyps or cancers. Recommendation:           - Patient has a contact number available for                            emergencies. The signs and symptoms of potential                            delayed complications were discussed with the                            patient. Return to normal activities tomorrow.                            Written discharge instructions were provided to the                            patient.                           - Resume previous diet.                           - Continue present medications.                           - Repeat colonoscopy in 10 years for screening                            purposes.                           - OV with Dr. Ardis Hughs in next several weeks to talk                             about recurrent diverticulitis, longer term plans. Milus Banister, MD 09/20/2018 2:06:34 PM This report has been signed electronically.

## 2018-09-20 NOTE — Progress Notes (Signed)
Report to PACU, RN, vss, BBS= Clear.  

## 2018-09-20 NOTE — Patient Instructions (Signed)
YOU HAD AN ENDOSCOPIC PROCEDURE TODAY AT THE [facility]:   Refer to the procedure report that was given to you for any specific questions about what was found during the examination.  If the procedure report does not answer your questions, please call your gastroenterologist to clarify.  If you requested that your care partner not be given the details of your procedure findings, then the procedure report has been included in a sealed envelope for you to review at your convenience later.  YOU SHOULD EXPECT: Some feelings of bloating in the abdomen. Passage of more gas than usual.  Walking can help get rid of the air that was put into your GI tract during the procedure and reduce the bloating. If you had a lower endoscopy (such as a colonoscopy or flexible sigmoidoscopy) you may notice spotting of blood in your stool or on the toilet paper. If you underwent a bowel prep for your procedure, you may not have a normal bowel movement for a few days.  Please Note:  You might notice some irritation and congestion in your nose or some drainage.  This is from the oxygen used during your procedure.  There is no need for concern and it should clear up in a day or so.  SYMPTOMS TO REPORT IMMEDIATELY:   Following lower endoscopy (colonoscopy or flexible sigmoidoscopy):  Excessive amounts of blood in the stool  Significant tenderness or worsening of abdominal pains  Swelling of the abdomen that is new, acute  Fever of 100F or higher  For urgent or emergent issues, a gastroenterologist can be reached at any hour by calling [phone].   DIET:  We do recommend a small meal at first, but then you may proceed to your regular diet.  Drink plenty of fluids but you should avoid alcoholic beverages for 24 hours.  ACTIVITY:  You should plan to take it easy for the rest of today and you should NOT DRIVE or use heavy machinery until tomorrow (because of the sedation medicines used during the test).     FOLLOW UP: Our staff will call the number listed on your records the next business day following your procedure to check on you and address any questions or concerns that you may have regarding the information given to you following your procedure. If we do not reach you, we will leave a message.  However, if you are feeling well and you are not experiencing any problems, there is no need to return our call.  We will assume that you have returned to your regular daily activities without incident.  If any biopsies were taken you will be contacted by phone or by letter within the next 1-3 weeks.  Please call us at [phone] if you have not heard about the biopsies in 3 weeks.    SIGNATURES/CONFIDENTIALITY: You and/or your care partner have signed paperwork which will be entered into your electronic medical record.  These signatures attest to the fact that that the information above on your After Visit Summary has been reviewed and is understood.  Full responsibility of the confidentiality of this discharge information lies with you and/or your care-partner. 

## 2018-09-23 ENCOUNTER — Telehealth: Payer: Self-pay | Admitting: *Deleted

## 2018-09-23 NOTE — Telephone Encounter (Signed)
  Follow up Call-  Call back number 09/20/2018  Post procedure Call Back phone  # [phone]  Permission to leave phone message No  Some recent data might be hidden     Patient questions:  Do you have a fever, pain , or abdominal swelling? No. Pain Score  0 *  Have you tolerated food without any problems? No.  Have you been able to return to your normal activities? Yes.    Do you have any questions about your discharge instructions: Diet   No. Medications  No. Follow up visit  No.  Do you have questions or concerns about your Care? No.  Actions: * If pain score is 4 or above: No action needed, pain <4.

## 2018-10-14 ENCOUNTER — Other Ambulatory Visit: Payer: Self-pay | Admitting: Family Medicine

## 2018-10-14 NOTE — Telephone Encounter (Signed)
Requested medication (s) are due for refill today: Yes  Requested medication (s) are on the active medication list: Yes  Last refill:  04/05/18  Future visit scheduled: No  Notes to clinic:  Unable to refill, last filled by Dr. Raliegh Ip.     Requested Prescriptions  Pending Prescriptions Disp Refills   celecoxib (CELEBREX) 200 MG capsule 60 capsule 0    Sig: TAKE ONE (1) CAPSULE BY MOUTH 2 TIMES DAILY     Analgesics:  COX2 Inhibitors Passed - 10/14/2018 11:48 AM      Passed - HGB in normal range and within 360 days    Hemoglobin  Date Value Ref Range Status  07/12/2018 13.1 12.0 - 15.0 g/dL Final         Passed - Cr in normal range and within 360 days    Creatinine, Ser  Date Value Ref Range Status  07/12/2018 0.68 0.40 - 1.20 mg/dL Final         Passed - Patient is not pregnant      Passed - Valid encounter within last 12 months    Recent Outpatient Visits          1 month ago Mild persistent asthma without complication   [facility] [place], MD   8 months ago Encounter for preventive health examination   [employer] [facility], Doretha Sou, MD   9 months ago Acute diverticulitis   [facility]rap[facility], [facility] at [facility], Alinda Sierras, MD   1 year ago Sprain of left medial ankle joint, initial [facility], Malka So, MD   2 years ago Encounter for preventive health examination   [facility]rap[facility], [facility] at [facility] [facility], Doretha Sou, MD      Future Appointments            In 1 week Marlou Sa, Tonna Corner, MD [facility]

## 2018-10-14 NOTE — Telephone Encounter (Signed)
Copied from [place] [phone]. Topic: Quick Communication - Rx Refill/Question >> Oct 14, 2018 10:47 AM Yvette Rack wrote: Medication: celecoxib (CELEBREX) 200 MG capsule  Has the patient contacted their pharmacy? yes   Preferred Pharmacy (with phone number or street name): [id number] [place], [place] [phone] (Phone)  [phone] (Fax)  Agent: Please be advised that RX refills may take up to 3 business days. We ask that you follow-up with your pharmacy.

## 2018-10-15 MED ORDER — CELECOXIB 200 MG PO CAPS: ORAL_CAPSULE | ORAL | 0 refills | Status: DC

## 2018-10-21 ENCOUNTER — Ambulatory Visit (INDEPENDENT_AMBULATORY_CARE_PROVIDER_SITE_OTHER): Payer: Medicare Other | Admitting: Orthopedic Surgery

## 2018-10-21 ENCOUNTER — Encounter (INDEPENDENT_AMBULATORY_CARE_PROVIDER_SITE_OTHER): Payer: Self-pay | Admitting: Orthopedic Surgery

## 2018-10-21 VITALS — Ht 60.0 in | Wt 225.0 lb

## 2018-10-21 DIAGNOSIS — M1711 Unilateral primary osteoarthritis, right knee: Secondary | ICD-10-CM | POA: Diagnosis not present

## 2018-10-21 DIAGNOSIS — M1712 Unilateral primary osteoarthritis, left knee: Secondary | ICD-10-CM

## 2018-10-21 MED ORDER — BUPIVACAINE HCL 0.25 % IJ SOLN
4.0000 mL | INTRAMUSCULAR | Status: AC | PRN
  Administered 2018-10-21: 4 mL via INTRA_ARTICULAR

## 2018-10-21 MED ORDER — METHYLPREDNISOLONE ACETATE 40 MG/ML IJ SUSP
40.0000 mg | INTRAMUSCULAR | Status: AC | PRN
  Administered 2018-10-21: 40 mg via INTRA_ARTICULAR

## 2018-10-21 MED ORDER — LIDOCAINE HCL 1 % IJ SOLN
5.0000 mL | INTRAMUSCULAR | Status: AC | PRN
  Administered 2018-10-21: 5 mL

## 2018-10-21 NOTE — Progress Notes (Signed)
Office Visit Note   Patient: [name]           Date of Birth: June 17, 1951           MRN: [id number] Visit Date: 10/21/2018 Requested by: [name], MD [place], [place] [address] PCP: [name], MD  Subjective: Chief Complaint  Patient presents with  . Left Knee - Pain  . Right Knee - Pain    HPI: Mind is a patient with known bilateral knee arthritis.  She had gel injections in September.  Did well with those but now her pain is recurring.  No interval hospitalization or injury.  She is downsizing at her home.  She is able to walk around the court house but is having difficulty with prolonged ambulation and standing.  The right knee hurts worse than the left.  She is taking some over-the-counter medication with marginal relief.              ROS: All systems reviewed are negative as they relate to the chief complaint within the history of present illness.  Patient denies  fevers or chills.   Assessment & Plan: Visit Diagnoses:  1. Unilateral primary osteoarthritis, left knee   2. Unilateral primary osteoarthritis, right knee     Plan: Impression is bilateral knee arthritis right worse than left.  Plan is bilateral cortisone injections today.  Continue with non-loadbearing quad strengthening exercises.  Come back in March for gel injections.  She might be heading for knee replacement sometime in the future.  Follow-Up Instructions: No follow-ups on file.   Orders:  No orders of the defined types were placed in this encounter.  No orders of the defined types were placed in this encounter.     Procedures: Large Joint Inj: bilateral knee on 10/21/2018 3:04 PM Indications: diagnostic evaluation, joint swelling and pain Details: 18 G 1.5 in needle, superolateral approach  Arthrogram: No  Medications (Right): 5 mL lidocaine 1 %; 4 mL bupivacaine 0.25 %; 40 mg methylPREDNISolone acetate 40 MG/ML Medications (Left): 5 mL lidocaine 1 %; 4  mL bupivacaine 0.25 %; 40 mg methylPREDNISolone acetate 40 MG/ML Outcome: tolerated well, no immediate complications Procedure, treatment alternatives, risks and benefits explained, specific risks discussed. Consent was given by the patient. Immediately prior to procedure a time out was called to verify the correct patient, procedure, equipment, support staff and site/side marked as required. Patient was prepped and draped in the usual sterile fashion.       Clinical Data: No additional findings.  Objective: Vital Signs: Ht 5' (1.524 m)   Wt 225 lb (102.1 kg)   BMI 43.94 kg/m   Physical Exam:   Constitutional: Patient appears well-developed HEENT:  Head: Normocephalic Eyes:EOM are normal Neck: Normal range of motion Cardiovascular: Normal rate Pulmonary/chest: Effort normal Neurologic: Patient is alert Skin: Skin is warm Psychiatric: Patient has normal mood and affect    Ortho Exam: Ortho exam demonstrates slight flexion contracture in the right leg about 7 degrees.  Left leg is straight.  Both knees bend easily past 90 degrees.  Trace effusion left knee no effusion right knee.  Pedal pulses palpable.  No groin pain with internal X rotation of the right leg little pain on the left-hand side.  Collaterals are stable bilaterally.  Specialty Comments:  No specialty comments available.  Imaging: No results found.   PMFS History: Patient Active Problem List   Diagnosis Date Noted  . Chronic pain of right  knee 08/24/2018  . Fall in home 07/14/2015  . TIBIALIS TENDINITIS 08/25/2009  . CHEST PAIN, OTHER, PAIN 10/21/2008  . PEDAL EDEMA 07/22/2008  . ANKLE PAIN, BILATERAL 04/01/2008  . LOW BACK PAIN 04/01/2008  . ACUTE BRONCHITIS 09/23/2007  . SARCOIDOSIS 09/02/2007  . Asthma 09/02/2007  . Diverticulosis of colon 07/11/2007  . BACK PAIN, CHRONIC 07/11/2007   Past Medical History:  Diagnosis Date  . Acute bronchitis   . Allergy   . Anxiety   . Arthritis    knees    . Asthma   . Backache, unspecified   . Cataract    removed both eyes  . Depression   . Diverticulitis   . Diverticulosis of colon (without mention of hemorrhage)   . Edema   . Glaucoma   . Lumbago   . Obesity   . Other chest pain   . Pain in joint, ankle and foot   . Sarcoidosis   . Tibialis tendinitis   . Unspecified asthma(493.90)     Family History  Problem Relation Age of Onset  . Hypertension Mother   . Stroke Mother   . Diabetes Father   . Sarcoidosis Sister   . Cancer Brother        prostate  . Colon cancer Neg Hx   . Colon polyps Neg Hx   . Esophageal cancer Neg Hx   . Rectal cancer Neg Hx   . Stomach cancer Neg Hx     Past Surgical History:  Procedure Laterality Date  . ABDOMINAL HYSTERECTOMY    . CATARACT EXTRACTION W/PHACO Right 06/24/2014   Procedure: CATARACT EXTRACTION PHACO AND INTRAOCULAR LENS PLACEMENT (IOC) RIGHT EYE WITH GONIOSYNECHIALYSIS;  Surgeon: Marylynn Pearson, MD;  Location: [place];  Service: Ophthalmology;  Laterality: Right;  . COLONOSCOPY    . EYE SURGERY Left    cataract surgery  . HAND SURGERY    . KNEE ARTHROSCOPY Right    Social History   Occupational History  . Not on file  Tobacco Use  . Smoking status: Never Smoker  . Smokeless tobacco: Never Used  Substance and Sexual Activity  . Alcohol use: Yes    Alcohol/week: 1.0 standard drinks    Types: 1 Standard drinks or equivalent per week  . Drug use: No  . Sexual activity: Not on file

## 2018-11-06 ENCOUNTER — Ambulatory Visit: Payer: Medicare Other | Admitting: Gastroenterology

## 2018-11-06 ENCOUNTER — Encounter: Payer: Self-pay | Admitting: Gastroenterology

## 2018-11-06 VITALS — BP 124/78 | HR 87 | Ht 60.0 in | Wt 235.0 lb

## 2018-11-06 DIAGNOSIS — Z8719 Personal history of other diseases of the digestive system: Secondary | ICD-10-CM | POA: Diagnosis not present

## 2018-11-06 NOTE — Progress Notes (Signed)
Review of pertinent gastrointestinal problems: 1. Routine risk for colon cancer: Colonoscopy 2009 Dr. Ardis Hughs no polyps, however diverticulosis was noted throughout the colon..  Colonoscopy December 2019 Dr. Ardis Hughs no polyps, diverticulosis was noted throughout the colon. 2. Recurrent sigmoid diverticulitis: Proven by CT scan June 2015 as well as October 2019.  Several other episodes over the years were clinically diagnosed as diverticulitis and treated as such with oral antibiotics.   HPI: This is a very pleasant 68 year old woman whom I last saw the time of a colonoscopy about a month and a half ago.  Chief complaint is recurrent diverticulitis  Starting around 2015 she has had about once annual episodes of diverticulitis.  These always present with left lower quadrant pains.  She has had CAT scans twice that have proven sigmoid diverticulitis.  She generally responds to 1 or 2 courses of Cipro, Flagyl.  In between these episodes she is just fine.  She has no significant issues with constipation.  No overt GI bleeding.  ROS: complete GI ROS as described in HPI, all other review negative.  Constitutional:  No unintentional weight loss   Past Medical History:  Diagnosis Date  . Acute bronchitis   . Allergy   . Anxiety   . Arthritis    knees  . Asthma   . Backache, unspecified   . Cataract    removed both eyes  . Depression   . Diverticulitis   . Diverticulosis of colon (without mention of hemorrhage)   . Edema   . Glaucoma   . Lumbago   . Obesity   . Other chest pain   . Pain in joint, ankle and foot   . Sarcoidosis   . Tibialis tendinitis   . Unspecified asthma(493.90)     Past Surgical History:  Procedure Laterality Date  . ABDOMINAL HYSTERECTOMY    . CATARACT EXTRACTION W/PHACO Right 06/24/2014   Procedure: CATARACT EXTRACTION PHACO AND INTRAOCULAR LENS PLACEMENT (IOC) RIGHT EYE WITH GONIOSYNECHIALYSIS;  Surgeon: Marylynn Pearson, MD;  Location: [place];  Service:  Ophthalmology;  Laterality: Right;  . COLONOSCOPY    . EYE SURGERY Left    cataract surgery  . HAND SURGERY    . KNEE ARTHROSCOPY Right     Current Outpatient Medications  Medication Sig Dispense Refill  . albuterol (PROAIR HFA) 108 (90 Base) MCG/ACT inhaler Inhale 2 puffs into the lungs every 6 (six) hours as needed. 1 Inhaler 5  . celecoxib (CELEBREX) 200 MG capsule TAKE ONE (1) CAPSULE BY MOUTH 2 TIMES DAILY 60 capsule 0  . cycloSPORINE (RESTASIS) 0.05 % ophthalmic emulsion Restasis 0.05 % eye drops in a dropperette    . estradiol (ESTRACE) 1 MG tablet Take 1 tablet (1 mg total) by mouth daily. 90 tablet 3  . FLUoxetine (PROZAC) 10 MG capsule Take 10 mg by mouth daily.    . Fluticasone-Salmeterol (ADVAIR) 250-50 MCG/DOSE AEPB Inhale 1 puff into the lungs 2 (two) times daily.    . furosemide (LASIX) 40 MG tablet TAKE 1 TABLET (40 MG TOTAL) BY MOUTH DAILY AS NEEDED 90 tablet 0  . Ginkgo Biloba (GINKOBA PO) Take 1 capsule by mouth daily.    . Multiple Vitamins-Minerals (MULTIVITAMIN WITH MINERALS) tablet Take 1 tablet by mouth daily.    Marland Kitchen omeprazole (PRILOSEC) 20 MG capsule Take 1 capsule (20 mg total) by mouth daily. 30 capsule 3  . OVER THE COUNTER MEDICATION CBD Oil daily at bedtime    . ZIOPTAN 0.0015 % SOLN Place 1 drop  into both eyes at bedtime.     No current facility-administered medications for this visit.     Allergies as of 11/06/2018 - Review Complete 11/06/2018  Allergen Reaction Noted  . Ibuprofen    . Sulfa antibiotics Rash 02/06/2018    Family History  Problem Relation Age of Onset  . Hypertension Mother   . Stroke Mother   . Diabetes Father   . Sarcoidosis Sister   . Cancer Brother        prostate  . Colon cancer Neg Hx   . Colon polyps Neg Hx   . Esophageal cancer Neg Hx   . Rectal cancer Neg Hx   . Stomach cancer Neg Hx     Social History   Socioeconomic History  . Marital status: Married    Spouse name: Not on file  . Number of children: Not on  file  . Years of education: Not on file  . Highest education level: Not on file  Occupational History  . Not on file  Social Needs  . Financial resource strain: Not on file  . Food insecurity:    Worry: Not on file    Inability: Not on file  . Transportation needs:    Medical: Not on file    Non-medical: Not on file  Tobacco Use  . Smoking status: Never Smoker  . Smokeless tobacco: Never Used  Substance and Sexual Activity  . Alcohol use: Yes    Alcohol/week: 1.0 standard drinks    Types: 1 Standard drinks or equivalent per week  . Drug use: No  . Sexual activity: Not on file  Lifestyle  . Physical activity:    Days per week: Not on file    Minutes per session: Not on file  . Stress: Not on file  Relationships  . Social connections:    Talks on phone: Not on file    Gets together: Not on file    Attends religious service: Not on file    Active member of club or organization: Not on file    Attends meetings of clubs or organizations: Not on file    Relationship status: Not on file  . Intimate partner violence:    Fear of current or ex partner: Not on file    Emotionally abused: Not on file    Physically abused: Not on file    Forced sexual activity: Not on file  Other Topics Concern  . Not on file  Social History Narrative  . Not on file     Physical Exam: BP 124/78   Pulse 87   Ht 5' (1.524 m)   Wt 235 lb (106.6 kg)   BMI 45.90 kg/m  Constitutional: generally well-appearing Psychiatric: alert and oriented x3 Abdomen: soft, nontender, nondistended, no obvious ascites, no peritoneal signs, normal bowel sounds No peripheral edema noted in lower extremities  Assessment and plan: 68 y.o. female with recurrent sigmoid diverticulitis  We discussed her recurrent sigmoid diverticulitis and I recommended she sit down with a surgeon to consider elective sigmoid resection.  She is probably going to have to undergo a urologic, kidney surgery in the near future and it  would be great if that were indeed necessary that she could have sigmoid colectomy at the same time if that is feasible from a technical, surgical standpoint.  I see no reason for any further blood tests or imaging studies prior to then.  Please see the "Patient Instructions" section for addition details about the plan.  Owens Loffler, MD [facility] 11/06/2018, 1:46 PM

## 2018-11-06 NOTE — Patient Instructions (Addendum)
Referral to CC surgery to consider sigmoid colectomy for recurrent diverticulitis.  Thank you for entrusting me with your care and choosing [facility].  Dr Ardis Hughs

## 2018-11-08 ENCOUNTER — Other Ambulatory Visit: Payer: Self-pay | Admitting: Urology

## 2018-11-08 DIAGNOSIS — D179 Benign lipomatous neoplasm, unspecified: Secondary | ICD-10-CM

## 2018-11-14 ENCOUNTER — Encounter: Payer: Self-pay | Admitting: *Deleted

## 2018-11-14 ENCOUNTER — Ambulatory Visit
Admission: RE | Admit: 2018-11-14 | Discharge: 2018-11-14 | Disposition: A | Discharge status: Home / Self Care | Payer: Medicare Other | Source: Ambulatory Visit | Attending: Urology | Admitting: Urology

## 2018-11-14 DIAGNOSIS — D179 Benign lipomatous neoplasm, unspecified: Secondary | ICD-10-CM

## 2018-11-14 HISTORY — PX: IR RADIOLOGIST EVAL & MGMT: IMG5224

## 2018-11-26 ENCOUNTER — Other Ambulatory Visit: Payer: Self-pay | Admitting: Family Medicine

## 2018-12-03 ENCOUNTER — Telehealth (INDEPENDENT_AMBULATORY_CARE_PROVIDER_SITE_OTHER): Payer: Self-pay | Admitting: Orthopedic Surgery

## 2018-12-03 ENCOUNTER — Telehealth (INDEPENDENT_AMBULATORY_CARE_PROVIDER_SITE_OTHER): Payer: Self-pay

## 2018-12-03 NOTE — Telephone Encounter (Signed)
Please advise on request for celebrex. Thanks.

## 2018-12-03 NOTE — Telephone Encounter (Signed)
Patient called stating her PCP Grier Mitts states patient need to get her Rx for Celebrex through Dr. Marlou Sa. Patient also want to get the gel injection in both knees.  [facility] [address] [facility] [place]. The number to the pharmacy is [phone]. The number to contact patient is [phone]

## 2018-12-03 NOTE — Telephone Encounter (Signed)
Can you please get patient approved for bilateral knee gel injections? Last had them done in September.

## 2018-12-04 MED ORDER — CELECOXIB 200 MG PO CAPS: ORAL_CAPSULE | ORAL | 0 refills | Status: DC

## 2018-12-04 NOTE — Telephone Encounter (Signed)
Submitted to pharmacy. Advised patient done.

## 2018-12-04 NOTE — Telephone Encounter (Signed)
Ok for 100 po bid # 60 pls clla thx

## 2018-12-05 NOTE — Consult Note (Signed)
Chief Complaint: Patient was seen in consultation today for possible embolization of a left renal angiomyolipoma at the request of Winter,Christopher Marjory Lies  Referring Physician(s): Winter,Christopher Marjory Lies  History of Present Illness: [name] is a 68 y.o. female who had 2 episodes of sigmoid diverticulitis in 2019.  CT on 07/12/2018 demonstrated 2 separate angiomyolipomas of the left kidney with the larger measuring approximately 4.4 cm and the smaller 1.5 cm.  These were also present on a CT in 2015.  Due to maximal size of greater than 4 cm, the patient was referred to Dr. Lovena Neighbours for urologic opinion.  He recommended considering selective prophylactic arterial embolization of the larger AML based on size.  She is asymptomatic with respect to the left kidney and denies any current abdominal or flank pain.  She has never had an episode of spontaneous hemorrhage or hematuria related to the renal mass.  Past Medical History:  Diagnosis Date  . Acute bronchitis   . Allergy   . Anxiety   . Arthritis    knees  . Asthma   . Backache, unspecified   . Cataract    removed both eyes  . Depression   . Diverticulitis   . Diverticulosis of colon (without mention of hemorrhage)   . Edema   . Glaucoma   . Lumbago   . Obesity   . Other chest pain   . Pain in joint, ankle and foot   . Sarcoidosis   . Tibialis tendinitis   . Unspecified asthma(493.90)     Past Surgical History:  Procedure Laterality Date  . ABDOMINAL HYSTERECTOMY    . CATARACT EXTRACTION W/PHACO Right 06/24/2014   Procedure: CATARACT EXTRACTION PHACO AND INTRAOCULAR LENS PLACEMENT (IOC) RIGHT EYE WITH GONIOSYNECHIALYSIS;  Surgeon: Marylynn Pearson, MD;  Location: [place];  Service: Ophthalmology;  Laterality: Right;  . COLONOSCOPY    . EYE SURGERY Left    cataract surgery  . HAND SURGERY    . IR RADIOLOGIST EVAL & MGMT  11/14/2018  . KNEE ARTHROSCOPY Right     Allergies: Ibuprofen and Sulfa  antibiotics  Medications: Prior to Admission medications   Medication Sig Start Date End Date Taking? Authorizing Provider  albuterol (PROAIR HFA) 108 (90 Base) MCG/ACT inhaler Inhale 2 puffs into the lungs every 6 (six) hours as needed. 07/25/16   Marletta Lor, MD  celecoxib (CELEBREX) 200 MG capsule TAKE ONE (1) CAPSULE BY MOUTH 2 TIMES DAILY 12/04/18   Meredith Pel, MD  cycloSPORINE (RESTASIS) 0.05 % ophthalmic emulsion Restasis 0.05 % eye drops in a dropperette    [provider]  estradiol (ESTRACE) 1 MG tablet Take 1 tablet (1 mg total) by mouth daily. 06/11/12   Marletta Lor, MD  FLUoxetine (PROZAC) 10 MG capsule Take 10 mg by mouth daily.    [provider]  Fluticasone-Salmeterol (ADVAIR) 250-50 MCG/DOSE AEPB Inhale 1 puff into the lungs 2 (two) times daily.    [provider]  furosemide (LASIX) 40 MG tablet TAKE 1 TABLET (40 MG TOTAL) BY MOUTH DAILY AS NEEDED 08/26/18   [name], MD  Ginkgo Biloba (GINKOBA PO) Take 1 capsule by mouth daily.    [provider]  Multiple Vitamins-Minerals (MULTIVITAMIN WITH MINERALS) tablet Take 1 tablet by mouth daily.    [provider]  omeprazole (PRILOSEC) 20 MG capsule Take 1 capsule (20 mg total) by mouth daily. 09/11/16   Marletta Lor, MD  OVER THE COUNTER MEDICATION CBD Oil  daily at bedtime    [provider]  ZIOPTAN 0.0015 % SOLN Place 1 drop into both eyes at bedtime. 10/15/18   [provider]     Family History  Problem Relation Age of Onset  . Hypertension Mother   . Stroke Mother   . Diabetes Father   . Sarcoidosis Sister   . Cancer Brother        prostate  . Colon cancer Neg Hx   . Colon polyps Neg Hx   . Esophageal cancer Neg Hx   . Rectal cancer Neg Hx   . Stomach cancer Neg Hx     Social History   Socioeconomic History  . Marital status: Married    Spouse name: Not on file  . Number of children: Not on file  . Years of  education: Not on file  . Highest education level: Not on file  Occupational History  . Not on file  Social Needs  . Financial resource strain: Not on file  . Food insecurity:    Worry: Not on file    Inability: Not on file  . Transportation needs:    Medical: Not on file    Non-medical: Not on file  Tobacco Use  . Smoking status: Never Smoker  . Smokeless tobacco: Never Used  Substance and Sexual Activity  . Alcohol use: Yes    Alcohol/week: 1.0 standard drinks    Types: 1 Standard drinks or equivalent per week  . Drug use: No  . Sexual activity: Not on file  Lifestyle  . Physical activity:    Days per week: Not on file    Minutes per session: Not on file  . Stress: Not on file  Relationships  . Social connections:    Talks on phone: Not on file    Gets together: Not on file    Attends religious service: Not on file    Active member of club or organization: Not on file    Attends meetings of clubs or organizations: Not on file    Relationship status: Not on file  Other Topics Concern  . Not on file  Social History Narrative  . Not on file     Review of Systems: A 12 point ROS discussed and pertinent positives are indicated in the HPI above.  All other systems are negative.  Review of Systems  Constitutional: Negative.   HENT: Negative.   Respiratory: Negative.   Cardiovascular: Negative.   Gastrointestinal: Negative.   Genitourinary: Negative.   Musculoskeletal: Negative.   Neurological: Negative.     Vital Signs: BP (!) 145/83   Pulse 93   Temp 98 F (36.7 C) (Oral)   Resp 16   Ht 5' (1.524 m)   Wt 107 kg   SpO2 97%   BMI 46.09 kg/m   Physical Exam Vitals signs reviewed.  Constitutional:      General: She is not in acute distress.    Appearance: Normal appearance. She is not ill-appearing or toxic-appearing.  HENT:     Head: Normocephalic and atraumatic.  Neck:     Musculoskeletal: Neck supple.  Cardiovascular:     Rate and Rhythm: Normal  rate and regular rhythm.     Heart sounds: Normal heart sounds. No murmur. No gallop.   Pulmonary:     Effort: Pulmonary effort is normal. No respiratory distress.     Breath sounds: Normal breath sounds. No stridor. No wheezing, rhonchi or rales.  Abdominal:  General: Abdomen is flat. Bowel sounds are normal. There is no distension.     Palpations: Abdomen is soft. There is no mass.     Tenderness: There is no abdominal tenderness. There is no right CVA tenderness, left CVA tenderness, guarding or rebound.  Musculoskeletal:        General: No swelling.  Lymphadenopathy:     Cervical: No cervical adenopathy.  Skin:    General: Skin is warm and dry.  Neurological:     General: No focal deficit present.     Mental Status: She is alert and oriented to person, place, and time.      Imaging: Ir Radiologist Eval & Mgmt  Result Date: 11/14/2018 Please refer to notes tab for details about interventional procedure. (Op Note)   Labs:  CBC: Recent Labs    02/06/18 1246 07/12/18 1148  WBC 5.0 5.5  HGB 13.0 13.1  HCT 39.6 40.4  PLT 333.0 332.0    COAGS: No results for input(s): INR, APTT in the last 8760 hours.  BMP: Recent Labs    02/06/18 1246 07/12/18 1148  NA 139  --   K 4.5  --   CL 103  --   CO2 27  --   GLUCOSE 89  --   BUN 16 11  CALCIUM 9.1  --   CREATININE 0.70 0.68    LIVER FUNCTION TESTS: Recent Labs    02/06/18 1246  BILITOT 0.3  AST 15  ALT 11  ALKPHOS 70  PROT 6.7  ALBUMIN 3.8    Assessment and Plan:  I met with Ms. Rath. We reviewed prior CT imaging.  Both left renal lesions are almost entirely fat density and clearly represent benign angiomyolipomas.  By my measurements, the dominant anterior interpolar AML of the left kidney demonstrating exophytic growth measured approximately 3.5 x 4.2 x 4.9 cm on 03/27/2014 and measures 3.5 x 4.5 x 5.2 cm on the study dated 07/12/2018.  This represents approximately 3 mm in growth in 2 planes over  a 4-year period.  The smaller posterolateral interpolar AML over that same time.  Measured 1.1 x 1.4 x 1.3 cm in 2015 and 1.2 x 1.5 x 1.3 cm in 2019.  I discussed treatment options with Ms. Godino including selective transcatheter embolization of dominant arterial supply to the larger AML of the left kidney.  Particle embolization is usually effective in causing some degree of shrinkage of the tumor and decreasing spontaneous bleeding risk over time.  Occasionally, transcatheter embolization may need to be repeated if there is redemonstration of tumor regrowth over time due to recruitment of additional blood supply.  The dominant mass demonstrates very little growth over the last nearly 4-1/2 years and the patient is currently asymptomatic.  I also discussed continued imaging surveillance and after discussion of treatment options, Ms. Forgione would prefer to undergo some additional surveillance before committing to embolization.  I recommended performing another CT of the abdomen in October of this year which will be 1 year after the prior study.  I will meet with her after that study is performed to review findings.  In the meantime, I did recommend that she call our office should she develop any persistent left flank pain or discomfort in the region of the left kidney.  Thank you for this interesting consult.  I greatly enjoyed meeting Likisha B Morlock and look forward to participating in their care.  A copy of this report was sent to the requesting provider on this  date.  Electronically Signed: Azzie Roup 12/05/2018, 9:14 AM     I spent a total of 40 Minutes  in face to face in clinical consultation, greater than 50% of which was counseling/coordinating care for a left renal angiomyolipoma.

## 2018-12-05 NOTE — Telephone Encounter (Signed)
Noted  

## 2018-12-07 ENCOUNTER — Other Ambulatory Visit: Payer: Self-pay | Admitting: Internal Medicine

## 2018-12-10 ENCOUNTER — Ambulatory Visit: Payer: Medicare Other | Admitting: Physical Therapy

## 2018-12-20 ENCOUNTER — Telehealth (INDEPENDENT_AMBULATORY_CARE_PROVIDER_SITE_OTHER): Payer: Self-pay

## 2018-12-20 NOTE — Telephone Encounter (Signed)
Submitted VOB for SynviscOne, bilateral knee. 

## 2018-12-24 ENCOUNTER — Telehealth (INDEPENDENT_AMBULATORY_CARE_PROVIDER_SITE_OTHER): Payer: Self-pay

## 2018-12-24 NOTE — Telephone Encounter (Signed)
Tried to call patient to schedule an appointment for gel injection with Dr. Marlou Sa, but no answer and no VM set-up to leave a message.    Approved for SynviscOne, bilateral knee. Buy & Bill Covered at 100% after Co-pay Co-pay of $95.00 (Required) No PA required

## 2018-12-25 ENCOUNTER — Ambulatory Visit: Payer: Medicare Other | Admitting: Physical Therapy

## 2018-12-26 ENCOUNTER — Telehealth (INDEPENDENT_AMBULATORY_CARE_PROVIDER_SITE_OTHER): Payer: Self-pay

## 2018-12-26 ENCOUNTER — Ambulatory Visit (INDEPENDENT_AMBULATORY_CARE_PROVIDER_SITE_OTHER): Payer: Medicare Other | Admitting: Orthopedic Surgery

## 2018-12-26 ENCOUNTER — Encounter (INDEPENDENT_AMBULATORY_CARE_PROVIDER_SITE_OTHER): Payer: Self-pay | Admitting: Orthopedic Surgery

## 2018-12-26 ENCOUNTER — Other Ambulatory Visit: Payer: Self-pay

## 2018-12-26 DIAGNOSIS — M170 Bilateral primary osteoarthritis of knee: Secondary | ICD-10-CM

## 2018-12-26 DIAGNOSIS — M1712 Unilateral primary osteoarthritis, left knee: Secondary | ICD-10-CM

## 2018-12-26 DIAGNOSIS — M1711 Unilateral primary osteoarthritis, right knee: Secondary | ICD-10-CM

## 2018-12-26 MED ORDER — LIDOCAINE HCL 1 % IJ SOLN
5.0000 mL | INTRAMUSCULAR | Status: AC | PRN
  Administered 2018-12-26: 5 mL

## 2018-12-26 MED ORDER — HYLAN G-F 20 48 MG/6ML IX SOSY
48.0000 mg | PREFILLED_SYRINGE | INTRA_ARTICULAR | Status: AC | PRN
  Administered 2018-12-26: 48 mg via INTRA_ARTICULAR

## 2018-12-26 NOTE — Progress Notes (Signed)
   Procedure Note  Patient: [name]             Date of Birth: [birth year]           MRN: [id number]             Visit Date: 12/26/2018  Procedures: Visit Diagnoses: Unilateral primary osteoarthritis, left knee  Unilateral primary osteoarthritis, right knee  Large Joint Inj: bilateral knee on 12/26/2018 11:27 AM Indications: pain, joint swelling and diagnostic evaluation Details: 18 G 1.5 in needle, superolateral approach  Arthrogram: No  Medications (Right): 5 mL lidocaine 1 %; 48 mg Hylan 48 MG/6ML Medications (Left): 5 mL lidocaine 1 %; 48 mg Hylan 48 MG/6ML Outcome: tolerated well, no immediate complications Procedure, treatment alternatives, risks and benefits explained, specific risks discussed. Consent was given by the patient. Immediately prior to procedure a time out was called to verify the correct patient, procedure, equipment, support staff and site/side marked as required. Patient was prepped and draped in the usual sterile fashion.

## 2018-12-26 NOTE — Telephone Encounter (Signed)
Can you please get patient approved for synvisc injection?

## 2018-12-27 ENCOUNTER — Other Ambulatory Visit (INDEPENDENT_AMBULATORY_CARE_PROVIDER_SITE_OTHER): Payer: Self-pay | Admitting: Orthopedic Surgery

## 2018-12-27 NOTE — Telephone Encounter (Signed)
y

## 2018-12-27 NOTE — Telephone Encounter (Signed)
Ok to rf? 

## 2018-12-30 ENCOUNTER — Telehealth (INDEPENDENT_AMBULATORY_CARE_PROVIDER_SITE_OTHER): Payer: Self-pay | Admitting: *Deleted

## 2018-12-30 MED ORDER — ACETAMINOPHEN-CODEINE #3 300-30 MG PO TABS: ORAL_TABLET | ORAL | 0 refills | Status: DC

## 2018-12-30 NOTE — Telephone Encounter (Signed)
Pt was in on Thurs and had gel injections on bilateral knees, states after numbness wore off her R leg became very painful. Wants to see if you can just call something in for the pain.  [place] [facility] .   CB. [phone]

## 2018-12-30 NOTE — Telephone Encounter (Signed)
Called to pharmacy. Advised patient done.

## 2018-12-30 NOTE — Telephone Encounter (Signed)
Please advise. Thanks.  

## 2018-12-30 NOTE — Telephone Encounter (Signed)
Ok for t 3 1  po q 8 # 30 pls clal thx

## 2019-01-02 ENCOUNTER — Telehealth (INDEPENDENT_AMBULATORY_CARE_PROVIDER_SITE_OTHER): Payer: Self-pay | Admitting: Orthopedic Surgery

## 2019-01-02 NOTE — Telephone Encounter (Signed)
Please advise. Thanks.  

## 2019-01-02 NOTE — Telephone Encounter (Signed)
[name] called in to give an update on her knee pain.  She is s/p bilateral gel injections on 12/26/2018.  States Dr. Marlou Sa called in a prescription earlier this week for her and she has not noticed much improvement.  She continues to be unable to put "pressure" on her right leg and is concerned. She has tried icing it and that did help with some of the swelling.   I offered her an appointment for tomorrow, but she stated since her right knee is hurting so much she cannot drive and will not have anyone who can bring her.  Please call her to discuss.

## 2019-01-03 MED ORDER — MELOXICAM 15 MG PO TABS: ORAL_TABLET | ORAL | 0 refills | Status: DC

## 2019-01-03 MED ORDER — TRAMADOL HCL 50 MG PO TABS: ORAL_TABLET | ORAL | 0 refills | Status: DC

## 2019-01-03 NOTE — Telephone Encounter (Signed)
I called.  She is having some pain medially in the knee.  I want to try her with tramadol Mobic and ice as well as range of motion exercises.  If that does not help she should come back next week.  Please call in tramadol 1 p.o. twice daily number#[phone] along with Mobic 1 p.o. daily for 5 days and then as needed #20

## 2019-01-03 NOTE — Telephone Encounter (Signed)
Patient has to wait 6 months to receive next set of gel injections.  Not available until after 06/28/2019.  Will submit for VOB closer to that date.

## 2019-01-03 NOTE — Telephone Encounter (Signed)
Called patient

## 2019-01-03 NOTE — Addendum Note (Signed)
Addended byLaurann Montana on: 01/03/2019 10:37 AM   Modules accepted: Orders

## 2019-02-10 ENCOUNTER — Encounter: Payer: Medicare Other | Admitting: Family Medicine

## 2019-03-04 ENCOUNTER — Ambulatory Visit: Payer: Self-pay | Admitting: *Deleted

## 2019-03-04 ENCOUNTER — Ambulatory Visit (INDEPENDENT_AMBULATORY_CARE_PROVIDER_SITE_OTHER): Payer: Medicare Other | Admitting: Family Medicine

## 2019-03-04 ENCOUNTER — Encounter: Payer: Self-pay | Admitting: Family Medicine

## 2019-03-04 ENCOUNTER — Other Ambulatory Visit: Payer: Self-pay

## 2019-03-04 DIAGNOSIS — J4540 Moderate persistent asthma, uncomplicated: Secondary | ICD-10-CM | POA: Diagnosis not present

## 2019-03-04 DIAGNOSIS — R0982 Postnasal drip: Secondary | ICD-10-CM | POA: Diagnosis not present

## 2019-03-04 DIAGNOSIS — Z7189 Other specified counseling: Secondary | ICD-10-CM

## 2019-03-04 NOTE — Telephone Encounter (Signed)
Pt scheduled with PCP today

## 2019-03-04 NOTE — Telephone Encounter (Signed)
Pt calling with complaints of labored breathing more than normal for the past 5 days. Pt calling to request testing for COVID-19 and states she was told she needed a "premission slip" in order to have testing [facility] at [facility] on Friday.Pt states that she does have a history of asthma or bronchitis but not sure of which one. Pt denies other symptoms at this time. Pt advised that she would need to be scheduled for a virtual visit with PCP for evaluation of current symptoms. Pt verbalized understanding. Best contact number for the pt is [phone]. Spoke with Misty at the office and conference call initiated so that pt could be scheduled for appt.  Reason for Disposition . MILD difficulty breathing (e.g., minimal/no SOB at rest, SOB with walking, pulse <100)  Answer Assessment - Initial Assessment Questions 1. CLOSE CONTACT: "Who is the person with the confirmed or suspected COVID-19 infection that you were exposed to?"     unsure 2. PLACE of CONTACT: "Where were you when you were exposed to COVID-19?" (e.g., home, school, medical waiting room; which city?)     *home 3. TYPE of CONTACT: "How much contact was there?" (e.g., sitting next to, live in same house, work in same office, same building)     Same house and talking to them 4. DURATION of CONTACT: "How long were you in contact with the COVID-19 patient?" (e.g., a few seconds, passed by person, a few minutes, live with the patient)     Talked with them while visting 5. DATE of CONTACT: "When did you have contact with a COVID-19 patient?" (e.g., how many days ago)     Was in contact with them on yesterday 6. TRAVEL: "Have you traveled out of the country recently?" If so, "When and where?"     * Also ask about out-of-state travel, since the CDC has identified some high risk cities for community spread in the [name].     * Note: Travel becomes less relevant if there is widespread community transmission where the patient lives.    [place] and  [place] has been in contact with someone traveling from there 7. COMMUNITY SPREAD: "Are there lots of cases of COVID-19 (community spread) where you live?" (See public health department website, if unsure)   * MAJOR community spread: high number of cases; numbers of cases are increasing; many people hospitalized.   * MINOR community spread: low number of cases; not increasing; few or no people hospitalized     8. SYMPTOMS: "Do you have any symptoms?" (e.g., fever, cough, breathing difficulty)     Labored breathing more than normal for the past 5 days, more pronounced that usual 9. PREGNANCY OR POSTPARTUM: "Is there any chance you are pregnant?" "When was your last menstrual period?" "Did you deliver in the last 2 weeks?"    n/a 10. HIGH RISK: "Do you have any heart or lung problems? Do you have a weak immune system?" (e.g., CHF, COPD, asthma, HIV positive, chemotherapy, renal failure, diabetes mellitus, sickle cell anemia)       Asthma or bronchitis does not know which one takes advair and albuterol  Answer Assessment - Initial Assessment Questions 1. COVID-19 DIAGNOSIS: "Who made your Coronavirus (COVID-19) diagnosis?" "Was it confirmed by a positive lab test?" If not diagnosed by a HCP, ask "Are there lots of cases (community spread) where you live?" (See public health department website, if unsure)   * MAJOR community spread: high number of cases; numbers of cases are increasing; many  people hospitalized.   * MINOR community spread: low number of cases; not increasing; few or no people hospitalized     Not diagnosed 2. ONSET: "When did the COVID-19 symptoms start?"      About 5 days ago 3. WORST SYMPTOM: "What is your worst symptom?" (e.g., cough, fever, shortness of breath, muscle aches)     Labored breathing 4. COUGH: "Do you have a cough?" If so, ask: "How bad is the cough?"       No 5. FEVER: "Do you have a fever?" If so, ask: "What is your temperature, how was it measured, and when did  it start?"     No 6. RESPIRATORY STATUS: "Describe your breathing?" (e.g., shortness of breath, wheezing, unable to speak)      Not in distress at the time of call 7. BETTER-SAME-WORSE: "Are you getting better, staying the same or getting worse compared to yesterday?"  If getting worse, ask, "In what way?"     Not assessed 8. HIGH RISK DISEASE: "Do you have any chronic medical problems?" (e.g., asthma, heart or lung disease, weak immune system, etc.)     Asthma or bronchitis not sure which one 9. PREGNANCY: "Is there any chance you are pregnant?" "When was your last menstrual period?"     n/a 10. OTHER SYMPTOMS: "Do you have any other symptoms?"  (e.g., runny nose, headache, sore throat, loss of smell)       No  Protocols used: CORONAVIRUS (COVID-19) DIAGNOSED OR SUSPECTED-A-AH, CORONAVIRUS (COVID-19) EXPOSURE-A-AH

## 2019-03-04 NOTE — Progress Notes (Signed)
Virtual Visit via Video Note Visit started via doxy, however after continued issues with feedback on audio pt was called on the phone to complete the visit.  I connected with [name] on 03/04/19 at  3:00 PM EDT by a video enabled telemedicine application and verified that I am speaking with the correct person using two identifiers.  Location patient: home Location provider:work or home office Persons participating in the virtual visit: patient, provider  I discussed the limitations of evaluation and management by telemedicine and the availability of in person appointments. The patient expressed understanding and agreed to proceed.   HPI: Pt is a 68 yo female with pmh asthma, diverticulosis, h/o sarcoidosis, glaucoma, arthritis, allergies.  Pt with "breathing problems".  Unsure if just asthma and allergies.  Pt having nasal congestion, hoarse voice x 5 days.  Using advair discus.  Has not used albuterol inhaler.   Pt is not taking anything for allergy symptoms.  Denies sore throat, HAs, fever, chills, sinus pain or pressure, ear pain or pressure, diarrhea.  States may have had some L ear "popping" a few wks ago.  Pt hoping to have COVID-19 testing done at her [facility], [facility] on Friday or Saturday, but needs a referral.  States the [facility] is holding the testing, but when she called to set up an appointment they were unaware of the testing site.  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   ROS: See pertinent positives and negatives per HPI.  Past Medical History:  Diagnosis Date  . Acute bronchitis   . Allergy   . Anxiety   . Arthritis    knees  . Asthma   . Backache, unspecified   . Cataract    removed both eyes  . Depression   . Diverticulitis   . Diverticulosis of colon (without mention of hemorrhage)   . Edema   . Glaucoma   . Lumbago   . Obesity   . Other chest pain   . Pain in joint, ankle and foot   .  Sarcoidosis   . Tibialis tendinitis   . Unspecified asthma(493.90)     Past Surgical History:  Procedure Laterality Date  . ABDOMINAL HYSTERECTOMY    . CATARACT EXTRACTION W/PHACO Right 06/24/2014   Procedure: CATARACT EXTRACTION PHACO AND INTRAOCULAR LENS PLACEMENT (IOC) RIGHT EYE WITH GONIOSYNECHIALYSIS;  Surgeon: Marylynn Pearson, MD;  Location: [place];  Service: Ophthalmology;  Laterality: Right;  . COLONOSCOPY    . EYE SURGERY Left    cataract surgery  . HAND SURGERY    . IR RADIOLOGIST EVAL & MGMT  11/14/2018  . KNEE ARTHROSCOPY Right     Family History  Problem Relation Age of Onset  . Hypertension Mother   . Stroke Mother   . Diabetes Father   . Sarcoidosis Sister   . Cancer Brother        prostate  . Colon cancer Neg Hx   . Colon polyps Neg Hx   . Esophageal cancer Neg Hx   . Rectal cancer Neg Hx   . 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   Current Outpatient Medications:  .  acetaminophen-codeine (TYLENOL #3) 300-30 MG tablet, 1 po q 8 prn, Disp: 30 tablet, Rfl: 0 .  albuterol (PROAIR HFA) 108 (90 Base) MCG/ACT inhaler, Inhale 2 puffs into the lungs every  6 (six) hours as needed., Disp: 1 Inhaler, Rfl: 5 .  celecoxib (CELEBREX) 200 MG capsule, TAKE 1 CAPSULE BY MOUTH TWICE A DAY, Disp: 60 capsule, Rfl: 0 .  cycloSPORINE (RESTASIS) 0.05 % ophthalmic emulsion, Restasis 0.05 % eye drops in a dropperette, Disp: , Rfl:  .  estradiol (ESTRACE) 1 MG tablet, Take 1 tablet (1 mg total) by mouth daily., Disp: 90 tablet, Rfl: 3 .  FLUoxetine (PROZAC) 10 MG capsule, Take 10 mg by mouth daily., Disp: , Rfl:  .  Fluticasone-Salmeterol (ADVAIR) 250-50 MCG/DOSE AEPB, Inhale 1 puff into the lungs 2 (two) times daily., Disp: , Rfl:  .  furosemide (LASIX) 40 MG tablet, TAKE 1 TABLET (40 MG TOTAL) BY MOUTH DAILY AS NEEDED, Disp: 90 tablet, Rfl: 0 .  Ginkgo Biloba (GINKOBA PO), Take 1 capsule by mouth daily., Disp: , Rfl:  .   meloxicam (MOBIC) 15 MG tablet, 1 po daily x 5 days then as needed, Disp: 20 tablet, Rfl: 0 .  Multiple Vitamins-Minerals (MULTIVITAMIN WITH MINERALS) tablet, Take 1 tablet by mouth daily., Disp: , Rfl:  .  omeprazole (PRILOSEC) 20 MG capsule, Take 1 capsule (20 mg total) by mouth daily., Disp: 30 capsule, Rfl: 3 .  OVER THE COUNTER MEDICATION, CBD Oil daily at bedtime, Disp: , Rfl:  .  traMADol (ULTRAM) 50 MG tablet, 1 po bid prn, Disp: 25 tablet, Rfl: 0 .  ZIOPTAN 0.0015 % SOLN, Place 1 drop into both eyes at bedtime., Disp: , Rfl:   EXAM:  VITALS per patient if applicable: RR between 17-61 bpm  GENERAL: alert, oriented, appears well and in no acute distress  HEENT: atraumatic, conjunctiva clear, no obvious abnormalities on inspection of external nose and ears  NECK: normal movements of the head and neck  LUNGS: on inspection no signs of respiratory distress, breathing rate appears normal, no obvious gross SOB, gasping or wheezing  CV: no obvious cyanosis  MS: moves all visible extremities without noticeable abnormality  PSYCH/NEURO: pleasant and cooperative, no obvious depression or anxiety, speech and thought processing grossly intact  ASSESSMENT AND PLAN:  Discussed the following assessment and plan:  Post-nasal drainage -allergies likely contributing to symptoms -discussed using saline nasal rinse, local honey, OTC allergy medication such as Zyrtec, Allegra, Claritin, or Xyzal. -also discussed using flonase if needed.  Moderate persistent asthma without complication -stable -continue Advair BID  -pt advised to use albuterol inhaler if needed  Educated About Covid-19 Virus Infection -pt given contact info for [facility] community testing sites. 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  F/u prn   I discussed the assessment and treatment plan with the patient. The patient was provided  an opportunity to ask questions and all were answered. The patient agreed with the plan and demonstrated an understanding of the instructions.   The patient was advised to call back or seek an in-person evaluation if the symptoms worsen or if the condition fails to improve as anticipated.  I provided 18 minutes of non-face-to-face time during this encounter.   [name], MD

## 2019-03-19 ENCOUNTER — Ambulatory Visit: Payer: Medicare Other | Admitting: Physical Therapy

## 2019-04-21 ENCOUNTER — Encounter: Payer: Medicare Other | Admitting: Family Medicine

## 2019-05-11 ENCOUNTER — Other Ambulatory Visit (INDEPENDENT_AMBULATORY_CARE_PROVIDER_SITE_OTHER): Payer: Self-pay | Admitting: Orthopedic Surgery

## 2019-05-12 NOTE — Telephone Encounter (Signed)
Either but not both she should be taking one or the other

## 2019-05-12 NOTE — Telephone Encounter (Signed)
Ok to rf? 

## 2019-05-19 ENCOUNTER — Other Ambulatory Visit: Payer: Self-pay

## 2019-05-19 ENCOUNTER — Telehealth (INDEPENDENT_AMBULATORY_CARE_PROVIDER_SITE_OTHER): Payer: Medicare Other | Admitting: Family Medicine

## 2019-05-19 DIAGNOSIS — J069 Acute upper respiratory infection, unspecified: Secondary | ICD-10-CM

## 2019-05-19 DIAGNOSIS — J018 Other acute sinusitis: Secondary | ICD-10-CM | POA: Diagnosis not present

## 2019-05-19 MED ORDER — BENZONATATE 100 MG PO CAPS: 100.0000 mg | ORAL_CAPSULE | Freq: Two times a day (BID) | ORAL | 0 refills | Status: DC | PRN

## 2019-05-19 MED ORDER — AMOXICILLIN 500 MG PO CAPS: 500.0000 mg | ORAL_CAPSULE | Freq: Two times a day (BID) | ORAL | 0 refills | Status: DC

## 2019-05-19 MED ORDER — ALBUTEROL SULFATE HFA 108 (90 BASE) MCG/ACT IN AERS: 2.0000 | INHALATION_SPRAY | Freq: Four times a day (QID) | RESPIRATORY_TRACT | 5 refills | Status: DC | PRN

## 2019-05-19 MED ORDER — AMOXICILLIN 500 MG PO CAPS: 500.0000 mg | ORAL_CAPSULE | Freq: Two times a day (BID) | ORAL | 0 refills | Status: AC

## 2019-05-19 NOTE — Progress Notes (Signed)
Virtual Visit via Video Note Visit started as a telephone encounter, then switched to a video visit via doxy. I connected with [name] on 05/19/19 at  8:00 AM EDT by a video enabled telemedicine application 2/2 [id number]-65 pandemic and verified that I am speaking with the correct person using two identifiers.  Location patient: home Location provider:work or home office Persons participating in the virtual visit: patient, provider  I discussed the limitations of evaluation and management by telemedicine and the availability of in person appointments. The patient expressed understanding and agreed to proceed.   HPI: Pt with "cold symptoms and respiratory issues"- post nasal drainage, itchy red eyes, mucusy drainage of eyes, cough, scratchy throat, sore jaw, ear pressure since Aug 1st.  Taking Advair, but needs refill on Albuterol inhaler.  Tried cough drops, cough syrup, mucinex, pataday, sudafed.  Denies fever, chills, or myalgias. Pt denies sick contacts.  She has not been going to work, as she did not want to get anyone sick.  Pt asked her pharmacist for medication recommendations and thought she could wait until her physical later this wk.   Pt states she wants something to "knock this out the first time".  Pt had negative COVID testing a month ago.  Pt also notes edema in LEs and a tingling sensation in her head that she wanted to discuss during her CPE.   ROS: See pertinent positives and negatives per HPI.  Past Medical History:  Diagnosis Date  . Acute bronchitis   . Allergy   . Anxiety   . Arthritis    knees  . Asthma   . Backache, unspecified   . Cataract    removed both eyes  . Depression   . Diverticulitis   . Diverticulosis of colon (without mention of hemorrhage)   . Edema   . Glaucoma   . Lumbago   . Obesity   . Other chest pain   . Pain in joint, ankle and foot   . Sarcoidosis   . Tibialis tendinitis   . Unspecified asthma(493.90)     Past Surgical History:   Procedure Laterality Date  . ABDOMINAL HYSTERECTOMY    . CATARACT EXTRACTION W/PHACO Right 06/24/2014   Procedure: CATARACT EXTRACTION PHACO AND INTRAOCULAR LENS PLACEMENT (IOC) RIGHT EYE WITH GONIOSYNECHIALYSIS;  Surgeon: Marylynn Pearson, MD;  Location: [facility];  Service: Ophthalmology;  Laterality: Right;  . COLONOSCOPY    . EYE SURGERY Left    cataract surgery  . HAND SURGERY    . IR RADIOLOGIST EVAL & MGMT  11/14/2018  . KNEE ARTHROSCOPY Right     Family History  Problem Relation Age of Onset  . Hypertension Mother   . Stroke Mother   . Diabetes Father   . Sarcoidosis Sister   . Cancer Brother        prostate  . Colon cancer Neg Hx   . Colon polyps Neg Hx   . Esophageal cancer Neg Hx   . Rectal cancer Neg Hx   . Stomach cancer Neg Hx     SOCIAL HX: Pt is a Chief Executive Officer   Current Outpatient Medications:  .  acetaminophen-codeine (TYLENOL #3) 300-30 MG tablet, 1 po q 8 prn, Disp: 30 tablet, Rfl: 0 .  albuterol (PROAIR HFA) 108 (90 Base) MCG/ACT inhaler, Inhale 2 puffs into the lungs every 6 (six) hours as needed., Disp: 6.7 g, Rfl: 5 .  amoxicillin (AMOXIL) 500 MG capsule, Take 1 capsule (500 mg total) by mouth 2 (two) times daily  for 7 days., Disp: 14 capsule, Rfl: 0 .  benzonatate (TESSALON) 100 MG capsule, Take 1 capsule (100 mg total) by mouth 2 (two) times daily as needed for cough., Disp: 20 capsule, Rfl: 0 .  celecoxib (CELEBREX) 200 MG capsule, TAKE 1 CAPSULE BY MOUTH TWICE A DAY, Disp: 60 capsule, Rfl: 0 .  cycloSPORINE (RESTASIS) 0.05 % ophthalmic emulsion, Restasis 0.05 % eye drops in a dropperette, Disp: , Rfl:  .  estradiol (ESTRACE) 1 MG tablet, Take 1 tablet (1 mg total) by mouth daily., Disp: 90 tablet, Rfl: 3 .  FLUoxetine (PROZAC) 10 MG capsule, Take 10 mg by mouth daily., Disp: , Rfl:  .  Fluticasone-Salmeterol (ADVAIR) 250-50 MCG/DOSE AEPB, Inhale 1 puff into the lungs 2 (two) times daily., Disp: , Rfl:  .  furosemide (LASIX) 40 MG tablet, TAKE 1 TABLET (40 MG  TOTAL) BY MOUTH DAILY AS NEEDED, Disp: 90 tablet, Rfl: 0 .  Ginkgo Biloba (GINKOBA PO), Take 1 capsule by mouth daily., Disp: , Rfl:  .  meloxicam (MOBIC) 15 MG tablet, 1 po daily x 5 days then as needed, Disp: 20 tablet, Rfl: 0 .  Multiple Vitamins-Minerals (MULTIVITAMIN WITH MINERALS) tablet, Take 1 tablet by mouth daily., Disp: , Rfl:  .  omeprazole (PRILOSEC) 20 MG capsule, Take 1 capsule (20 mg total) by mouth daily., Disp: 30 capsule, Rfl: 3 .  OVER THE COUNTER MEDICATION, CBD Oil daily at bedtime, Disp: , Rfl:  .  traMADol (ULTRAM) 50 MG tablet, 1 po bid prn, Disp: 25 tablet, Rfl: 0 .  ZIOPTAN 0.0015 % SOLN, Place 1 drop into both eyes at bedtime., Disp: , Rfl:   EXAM:  VITALS per patient if applicable:  RR between 12-20 bpm  GENERAL: alert, oriented, appears well and in no acute distress  HEENT: atraumatic, conjunctiva clear, no obvious abnormalities on inspection of external nose and ears  NECK: normal movements of the head and neck  LUNGS: on inspection no signs of respiratory distress, breathing rate appears normal, no obvious gross SOB, gasping or wheezing  CV: no obvious cyanosis  MS: moves all visible extremities without noticeable abnormality  PSYCH/NEURO: pleasant and cooperative, no obvious depression or anxiety, speech and thought processing grossly intact  ASSESSMENT AND PLAN:  Discussed the following assessment and plan:  URI with cough and congestion  -continue supportive care.  Ok to use nasal spray -continue inhalers.  Albuterol refilled - Plan: benzonatate (TESSALON) 100 MG capsule  Other subacute sinusitis  - Plan: amoxicillin (AMOXIL) 500 MG capsule  F/u prn.  Pt advised will need to wait until well for CPE.   I discussed the assessment and treatment plan with the patient. The patient was provided an opportunity to ask questions and all were answered. The patient agreed with the plan and demonstrated an understanding of the instructions.   The  patient was advised to call back or seek an in-person evaluation if the symptoms worsen or if the condition fails to improve as anticipated.  I provided 15 minutes of non-face-to-face time during this encounter.   [name], MD

## 2019-05-21 ENCOUNTER — Telehealth: Payer: Self-pay

## 2019-05-21 ENCOUNTER — Ambulatory Visit: Payer: Medicare Other | Admitting: Family Medicine

## 2019-05-21 MED ORDER — CELECOXIB 200 MG PO CAPS: ORAL_CAPSULE | ORAL | 0 refills | Status: DC

## 2019-05-21 NOTE — Addendum Note (Signed)
Addended byLaurann Montana on: 05/21/2019 02:38 PM   Modules accepted: Orders

## 2019-05-21 NOTE — Telephone Encounter (Signed)
y

## 2019-05-21 NOTE — Telephone Encounter (Signed)
Submitted rx

## 2019-05-21 NOTE — Telephone Encounter (Signed)
Received fax requesting refill on celebrex 1 po bid #60 sent to [facility]. Young for this?

## 2019-05-23 ENCOUNTER — Telehealth: Payer: Self-pay

## 2019-05-23 NOTE — Telephone Encounter (Signed)
Submitted VOB for SynviscOne, bilateral knee. 

## 2019-05-30 ENCOUNTER — Telehealth: Payer: Self-pay

## 2019-05-30 NOTE — Telephone Encounter (Signed)
Patient aware that she is approved for gel injection.  Approved for SynviscOne, bilateral knee. Buy & Bill Covered at 100% through her insurance. Co-pay of $50.00 required No PA required  Appt.06/30/2019 with Dr. Marlou Sa

## 2019-06-22 ENCOUNTER — Other Ambulatory Visit: Payer: Self-pay | Admitting: Orthopedic Surgery

## 2019-06-30 ENCOUNTER — Ambulatory Visit (INDEPENDENT_AMBULATORY_CARE_PROVIDER_SITE_OTHER): Payer: Medicare Other | Admitting: Orthopedic Surgery

## 2019-06-30 DIAGNOSIS — M1712 Unilateral primary osteoarthritis, left knee: Secondary | ICD-10-CM | POA: Diagnosis not present

## 2019-06-30 DIAGNOSIS — M1711 Unilateral primary osteoarthritis, right knee: Secondary | ICD-10-CM

## 2019-06-30 MED ORDER — TRAMADOL HCL 50 MG PO TABS: ORAL_TABLET | ORAL | 0 refills | Status: DC

## 2019-07-01 ENCOUNTER — Encounter: Payer: Self-pay | Admitting: Orthopedic Surgery

## 2019-07-01 DIAGNOSIS — M1712 Unilateral primary osteoarthritis, left knee: Secondary | ICD-10-CM

## 2019-07-01 DIAGNOSIS — M1711 Unilateral primary osteoarthritis, right knee: Secondary | ICD-10-CM | POA: Diagnosis not present

## 2019-07-01 MED ORDER — HYLAN G-F 20 48 MG/6ML IX SOSY
48.0000 mg | PREFILLED_SYRINGE | INTRA_ARTICULAR | Status: AC | PRN
  Administered 2019-07-01: 48 mg via INTRA_ARTICULAR

## 2019-07-01 MED ORDER — LIDOCAINE HCL 1 % IJ SOLN
5.0000 mL | INTRAMUSCULAR | Status: AC | PRN
  Administered 2019-07-01: 5 mL

## 2019-07-01 NOTE — Progress Notes (Signed)
   Procedure Note  Patient: [name]             Date of Birth: Jan 03, 1951           MRN: [id number]             Visit Date: 06/30/2019  Procedures: Visit Diagnoses:  1. Unilateral primary osteoarthritis, left knee   2. Unilateral primary osteoarthritis, right knee     Large Joint Inj: bilateral knee on 07/01/2019 2:54 PM Indications: pain, joint swelling and diagnostic evaluation Details: 18 G 1.5 in needle, superolateral approach  Arthrogram: No  Medications (Right): 5 mL lidocaine 1 %; 48 mg Hylan 48 MG/6ML Medications (Left): 5 mL lidocaine 1 %; 48 mg Hylan 48 MG/6ML Outcome: tolerated well, no immediate complications Procedure, treatment alternatives, risks and benefits explained, specific risks discussed. Consent was given by the patient. Immediately prior to procedure a time out was called to verify the correct patient, procedure, equipment, support staff and site/side marked as required. Patient was prepped and draped in the usual sterile fashion.     This patient is diagnosed with osteoarthritis of the knee(s).    Radiographs show evidence of joint space narrowing, osteophytes, subchondral sclerosis and/or subchondral cysts.  This patient has knee pain which interferes with functional and activities of daily living.    This patient has experienced inadequate response, adverse effects and/or intolerance with conservative treatments such as acetaminophen, NSAIDS, topical creams, physical therapy or regular exercise, knee bracing and/or weight loss.   This patient has experienced inadequate response or has a contraindication to intra articular steroid injections for at least 3 months.   This patient is not scheduled to have a total knee replacement within 6 months of starting treatment with viscosupplementation.

## 2019-07-02 ENCOUNTER — Other Ambulatory Visit: Payer: Self-pay | Admitting: Interventional Radiology

## 2019-07-02 ENCOUNTER — Other Ambulatory Visit (INDEPENDENT_AMBULATORY_CARE_PROVIDER_SITE_OTHER): Payer: Self-pay | Admitting: Orthopedic Surgery

## 2019-07-02 DIAGNOSIS — D179 Benign lipomatous neoplasm, unspecified: Secondary | ICD-10-CM

## 2019-07-03 NOTE — Progress Notes (Signed)
Will submit last week in February, 2021.

## 2019-08-06 ENCOUNTER — Other Ambulatory Visit: Payer: Self-pay | Admitting: Family Medicine

## 2019-08-06 MED ORDER — FLUTICASONE-SALMETEROL 250-50 MCG/DOSE IN AEPB: 1.0000 | INHALATION_SPRAY | Freq: Two times a day (BID) | RESPIRATORY_TRACT | 1 refills | Status: DC

## 2019-08-06 NOTE — Telephone Encounter (Signed)
Medication Refill - Medication: Fluticasone-Salmeterol (ADVAIR) 250-50 MCG/DOSE AEPB  Pt is completely out of medication, has requested this at the pharmacy since Friday of last week. Please advise   Has the patient contacted their pharmacy? Yes.   (Agent: If no, request that the patient contact the pharmacy for the refill.) (Agent: If yes, when and what did the pharmacy advise?)  Preferred Pharmacy (with phone number or street name):  [id number] - [place], [address]  [place] [address]  Phone: [phone] Fax: [phone]     Agent: Please be advised that RX refills may take up to 3 business days. We ask that you follow-up with your pharmacy.

## 2019-08-06 NOTE — Telephone Encounter (Signed)
Requested medication (s) are due for refill today: yes  Requested medication (s) are on the active medication list: yes  Last refill:  08/12/2018  Future visit scheduled: no  Notes to clinic:  Last filled by historical provider Patient has been out since last Friday    Requested Prescriptions  Pending Prescriptions Disp Refills   Fluticasone-Salmeterol (ADVAIR) 250-50 MCG/DOSE AEPB 60 each     Sig: Inhale 1 puff into the lungs 2 (two) times daily.     There is no refill protocol information for this order

## 2019-08-07 NOTE — Telephone Encounter (Signed)
Pt states generic was called in for her Advair rx. Please call in name brand.  Pt states generic does not work as well

## 2019-08-10 ENCOUNTER — Other Ambulatory Visit: Payer: Self-pay | Admitting: Family Medicine

## 2019-09-24 ENCOUNTER — Other Ambulatory Visit: Payer: Self-pay

## 2019-09-24 MED ORDER — CELECOXIB 200 MG PO CAPS: 200.0000 mg | ORAL_CAPSULE | Freq: Two times a day (BID) | ORAL | 0 refills | Status: DC

## 2019-10-03 LAB — HM COLONOSCOPY

## 2019-10-11 ENCOUNTER — Other Ambulatory Visit: Payer: Self-pay | Admitting: Orthopedic Surgery

## 2019-10-13 NOTE — Telephone Encounter (Signed)
Ok to rf? 

## 2019-10-21 ENCOUNTER — Ambulatory Visit: Payer: Medicare PPO | Attending: Internal Medicine

## 2019-10-21 DIAGNOSIS — Z23 Encounter for immunization: Secondary | ICD-10-CM

## 2019-10-21 NOTE — Progress Notes (Signed)
   Covid-19 Vaccination Clinic  Name:  MATI GRISWELL    MRN: [id number] DOB: 1951-04-06  10/21/2019  Ms. Haese was observed post Covid-19 immunization for 15 minutes without incidence. She was provided with Vaccine Information Sheet and instruction to access the V-Safe system.   Ms. Dam was instructed to call 911 with any severe reactions post vaccine: Marland Kitchen Difficulty breathing  . Swelling of your face and throat  . A fast heartbeat  . A bad rash all over your body  . Dizziness and weakness    Immunizations Administered    Name Date Dose VIS Date Route   Pfizer COVID-19 Vaccine 10/21/2019  2:54 PM 0.3 mL 09/12/2019 Intramuscular   Manufacturer: [place]   Lot: [id number]   [place]: [id number]

## 2019-11-10 ENCOUNTER — Other Ambulatory Visit: Payer: Self-pay | Admitting: Family Medicine

## 2019-11-10 ENCOUNTER — Ambulatory Visit: Payer: Medicare PPO | Attending: Internal Medicine

## 2019-11-10 DIAGNOSIS — Z23 Encounter for immunization: Secondary | ICD-10-CM

## 2019-11-10 NOTE — Progress Notes (Signed)
   Covid-19 Vaccination Clinic  Name:  [name]    MRN: [id number] DOB: 02-Feb-1951  11/10/2019  [name] was observed post Covid-19 immunization for 15 minutes without incidence. She was provided with Vaccine Information Sheet and instruction to access the V-Safe system.   [name] was instructed to call 911 with any severe reactions post vaccine: Marland Kitchen Difficulty breathing  . Swelling of your face and throat  . A fast heartbeat  . A bad rash all over your body  . Dizziness and weakness    Immunizations Administered    Name Date Dose VIS Date Route   Pfizer COVID-19 Vaccine 11/10/2019  4:38 PM 0.3 mL 09/12/2019 Intramuscular   Manufacturer: [place]   Lot: [id number]   [place]: [id number]

## 2019-11-11 ENCOUNTER — Other Ambulatory Visit: Payer: Self-pay | Admitting: Surgical

## 2019-11-11 ENCOUNTER — Telehealth: Payer: Self-pay

## 2019-11-11 MED ORDER — MELOXICAM 15 MG PO TABS: ORAL_TABLET | ORAL | 0 refills | Status: DC

## 2019-11-11 NOTE — Telephone Encounter (Signed)
Patient request refill on meloxicam 15mg    [facility]

## 2019-11-28 ENCOUNTER — Telehealth: Payer: Self-pay

## 2019-11-28 NOTE — Telephone Encounter (Signed)
Submitted for VOB for Monovisc-Bilateral knee  

## 2019-11-28 NOTE — Telephone Encounter (Signed)
Approved for Monovisc-Bilateral knee Dr. Latanya Presser and Bill $40 copay No OOP No prior auth required

## 2019-12-01 NOTE — Telephone Encounter (Signed)
Pt informed of approval and copay. She stated understanding. She also was informed that we are not excepting cash at this time due to covid. She stated understanding to this as well

## 2019-12-16 ENCOUNTER — Other Ambulatory Visit: Payer: Self-pay | Admitting: Surgical

## 2019-12-16 NOTE — Telephone Encounter (Signed)
This is a GD pt 

## 2019-12-16 NOTE — Telephone Encounter (Signed)
Patient notified

## 2019-12-16 NOTE — Telephone Encounter (Signed)
Please advise 

## 2019-12-29 ENCOUNTER — Ambulatory Visit (INDEPENDENT_AMBULATORY_CARE_PROVIDER_SITE_OTHER): Payer: Medicare PPO | Admitting: Orthopedic Surgery

## 2019-12-29 ENCOUNTER — Other Ambulatory Visit: Payer: Self-pay

## 2019-12-29 DIAGNOSIS — M25561 Pain in right knee: Secondary | ICD-10-CM | POA: Diagnosis not present

## 2019-12-29 DIAGNOSIS — M25562 Pain in left knee: Secondary | ICD-10-CM

## 2019-12-29 DIAGNOSIS — M1712 Unilateral primary osteoarthritis, left knee: Secondary | ICD-10-CM | POA: Diagnosis not present

## 2019-12-29 DIAGNOSIS — M1711 Unilateral primary osteoarthritis, right knee: Secondary | ICD-10-CM

## 2019-12-29 MED ORDER — LIDOCAINE HCL 1 % IJ SOLN
5.0000 mL | INTRAMUSCULAR | Status: AC | PRN
  Administered 2019-12-29: 5 mL

## 2019-12-29 MED ORDER — HYALURONAN 88 MG/4ML IX SOSY
88.0000 mg | PREFILLED_SYRINGE | INTRA_ARTICULAR | Status: AC | PRN
  Administered 2019-12-29: 88 mg via INTRA_ARTICULAR

## 2019-12-29 MED ORDER — TRAMADOL HCL 50 MG PO TABS: ORAL_TABLET | ORAL | 0 refills | Status: DC

## 2019-12-29 NOTE — Progress Notes (Signed)
   Procedure Note  Patient: [name]             Date of Birth: 1951-06-13           MRN: [id number]             Visit Date: 12/29/2019  Procedures: Visit Diagnoses:  1. Unilateral primary osteoarthritis, left knee   2. Unilateral primary osteoarthritis, right knee     Large Joint Inj: bilateral knee on 12/29/2019 2:24 PM Indications: pain, joint swelling and diagnostic evaluation Details: 18 G 1.5 in needle, superolateral approach  Arthrogram: No  Medications (Right): 5 mL lidocaine 1 %; 88 mg Hyaluronan 88 MG/4ML Medications (Left): 5 mL lidocaine 1 %; 88 mg Hyaluronan 88 MG/4ML Outcome: tolerated well, no immediate complications Procedure, treatment alternatives, risks and benefits explained, specific risks discussed. Consent was given by the patient. Immediately prior to procedure a time out was called to verify the correct patient, procedure, equipment, support staff and site/side marked as required. Patient was prepped and draped in the usual sterile fashion.    Pain is gradually worsening.  We will provide a moderate amount of pain medicine for injection related pain which occurred after last injection.  She may be heading for knee replacement.  Follow-up in 6 months for repeat gel injection.This patient is diagnosed with osteoarthritis of the knee(s).    Radiographs show evidence of joint space narrowing, osteophytes, subchondral sclerosis and/or subchondral cysts.  This patient has knee pain which interferes with functional and activities of daily living.    This patient has experienced inadequate response, adverse effects and/or intolerance with conservative treatments such as acetaminophen, NSAIDS, topical creams, physical therapy or regular exercise, knee bracing and/or weight loss.   This patient has experienced inadequate response or has a contraindication to intra articular steroid injections for at least 3 months.   This patient is not scheduled to have a total  knee replacement within 6 months of starting treatment with viscosupplementation.

## 2019-12-29 NOTE — Addendum Note (Signed)
Addended byLaurann Montana on: 12/29/2019 02:57 PM   Modules accepted: Orders

## 2019-12-30 NOTE — Progress Notes (Signed)
noted 

## 2020-01-15 ENCOUNTER — Other Ambulatory Visit: Payer: Self-pay | Admitting: Family Medicine

## 2020-02-16 ENCOUNTER — Other Ambulatory Visit: Payer: Self-pay | Admitting: Surgical

## 2020-02-16 NOTE — Telephone Encounter (Signed)
Pls advise. Thanks.  

## 2020-04-12 ENCOUNTER — Other Ambulatory Visit: Payer: Self-pay | Admitting: Surgical

## 2020-04-12 ENCOUNTER — Telehealth: Payer: Self-pay

## 2020-04-12 MED ORDER — MELOXICAM 15 MG PO TABS: 15.0000 mg | ORAL_TABLET | Freq: Every day | ORAL | 0 refills | Status: DC | PRN

## 2020-04-12 NOTE — Telephone Encounter (Signed)
Fax from [facility] received on behalf of patient for rxrf request for meloxicam 15mg  #30. Please advise. Thanks.

## 2020-04-12 NOTE — Telephone Encounter (Signed)
submitted

## 2020-04-28 ENCOUNTER — Other Ambulatory Visit: Payer: Self-pay | Admitting: *Deleted

## 2020-04-28 MED ORDER — FLUTICASONE-SALMETEROL 250-50 MCG/DOSE IN AEPB: INHALATION_SPRAY | RESPIRATORY_TRACT | 1 refills | Status: DC

## 2020-05-06 ENCOUNTER — Other Ambulatory Visit: Payer: Self-pay

## 2020-05-12 ENCOUNTER — Other Ambulatory Visit: Payer: Self-pay | Admitting: Surgical

## 2020-05-15 ENCOUNTER — Other Ambulatory Visit: Payer: Self-pay | Admitting: Family Medicine

## 2020-05-17 NOTE — Telephone Encounter (Signed)
Pt needs appointment for further refills 

## 2020-06-06 ENCOUNTER — Other Ambulatory Visit: Payer: Self-pay | Admitting: Orthopedic Surgery

## 2020-06-08 NOTE — Telephone Encounter (Signed)
Please advise. Thanks.  

## 2020-06-10 ENCOUNTER — Other Ambulatory Visit: Payer: Self-pay | Admitting: Family Medicine

## 2020-06-18 ENCOUNTER — Telehealth: Payer: Self-pay

## 2020-06-18 NOTE — Telephone Encounter (Signed)
Submitted VOB, Monovisc, bilateral knee. 

## 2020-06-29 ENCOUNTER — Telehealth: Payer: Self-pay

## 2020-06-29 NOTE — Telephone Encounter (Signed)
PA required for Monovisc, bilateral knee. PA currently pending through [facility] online portal. Pending PA# [id number]

## 2020-07-06 ENCOUNTER — Telehealth: Payer: Self-pay

## 2020-07-06 NOTE — Telephone Encounter (Signed)
PA still pending per [employer] [employer] for Monovisc, bilateral knee.

## 2020-07-08 ENCOUNTER — Telehealth: Payer: Self-pay

## 2020-07-08 NOTE — Telephone Encounter (Signed)
Tried calling patient to schedule appointment for gel injection with Dr. Marlou Sa, but no answer and no VM set-up.  Approved, Monovisc, bilateral knee. [place] Once OOP is met, patient will be covered at 100%.  Co-pay of $40.00 PA required PA Approval# [id number] Valid 06/29/2020- 12/27/2020

## 2020-08-24 ENCOUNTER — Other Ambulatory Visit: Payer: Self-pay | Admitting: Surgical

## 2020-08-24 ENCOUNTER — Telehealth: Payer: Self-pay | Admitting: Orthopedic Surgery

## 2020-08-24 MED ORDER — MELOXICAM 15 MG PO TABS
ORAL_TABLET | ORAL | 0 refills | Status: DC
Start: 2020-08-24 — End: 2020-11-22

## 2020-08-24 NOTE — Telephone Encounter (Signed)
Please advise 

## 2020-08-24 NOTE — Telephone Encounter (Signed)
Patient called requesting a refill of meloxicam. Please send to pharmacy on file. Patient phone number is [phone].

## 2020-08-24 NOTE — Telephone Encounter (Signed)
Refilled, please call, also clarify not to take with other NSAIDs, I saw she has celebrex in her chart

## 2020-08-25 NOTE — Telephone Encounter (Signed)
I called patient and she is not is not taking any other anti inflammatory medication.

## 2020-09-01 ENCOUNTER — Ambulatory Visit (INDEPENDENT_AMBULATORY_CARE_PROVIDER_SITE_OTHER): Payer: Medicare PPO | Admitting: Family Medicine

## 2020-09-01 ENCOUNTER — Other Ambulatory Visit: Payer: Self-pay

## 2020-09-01 ENCOUNTER — Encounter: Payer: Self-pay | Admitting: Family Medicine

## 2020-09-01 VITALS — BP 142/88 | HR 102 | Temp 98.1°F | Ht 60.0 in | Wt 231.4 lb

## 2020-09-01 DIAGNOSIS — Z0000 Encounter for general adult medical examination without abnormal findings: Secondary | ICD-10-CM

## 2020-09-01 DIAGNOSIS — Z6841 Body Mass Index (BMI) 40.0 and over, adult: Secondary | ICD-10-CM | POA: Diagnosis not present

## 2020-09-01 DIAGNOSIS — M170 Bilateral primary osteoarthritis of knee: Secondary | ICD-10-CM

## 2020-09-01 DIAGNOSIS — H9191 Unspecified hearing loss, right ear: Secondary | ICD-10-CM | POA: Diagnosis not present

## 2020-09-01 DIAGNOSIS — H6121 Impacted cerumen, right ear: Secondary | ICD-10-CM

## 2020-09-01 NOTE — Progress Notes (Signed)
Subjective:     [name] is a 69 y.o. female and is here for a comprehensive physical exam. Pt has intermittent "electric shocks in brain.  1 inch long".  Happens during warmer months.  May last 20 sec.  Pt notes knee pain x yrs.  Received gel shots which provide relief for 1.5 months.  Pt with decreased hearing in right ear.  Endorses mammogram scheduled for January.  [facility]  Patient seen by ophthalmology, Dr. Marylynn Pearson, endorses glaucoma.  Social History   Socioeconomic History  . Marital status: Widowed    Spouse name: Not on file  . Number of children: Not on file  . Years of education: Not on file  . Highest education level: Not on file  Occupational History  . Not on file  Tobacco Use  . Smoking status: Never Smoker  . Smokeless tobacco: Never Used  Substance and Sexual Activity  . Alcohol use: Yes    Alcohol/week: 1.0 standard drink    Types: 1 Standard drinks or equivalent per week  . Drug use: No  . Sexual activity: Not on file  Other Topics Concern  . Not on file  Social History Narrative  . Not on file   Social Determinants of Health   Financial Resource Strain:   . Difficulty of Paying Living Expenses: Not on file  Food Insecurity:   . Worried About Charity fundraiser in the Last Year: Not on file  . Ran Out of Food in the Last Year: Not on file  Transportation Needs:   . Lack of Transportation (Medical): Not on file  . Lack of Transportation (Non-Medical): Not on file  Physical Activity:   . Days of Exercise per Week: Not on file  . Minutes of Exercise per Session: Not on file  Stress:   . Feeling of Stress : Not on file  Social Connections:   . Frequency of Communication with Friends and Family: Not on file  . Frequency of Social Gatherings with Friends and Family: Not on file  . Attends Religious Services: Not on file  . Active Member of Clubs or Organizations: Not on file  . Attends Theatre manager Meetings: Not on file  . Marital Status: Not on file  Intimate Partner Violence:   . Fear of Current or Ex-Partner: Not on file  . Emotionally Abused: Not on file  . Physically Abused: Not on file  . Sexually Abused: Not on file   Health Maintenance  Topic Date Due  . MAMMOGRAM  06/05/2016  . PNA vac Low Risk Adult (2 of 2 - PPSV23) 09/11/2017  . INFLUENZA VACCINE  05/02/2020  . TETANUS/TDAP  09/11/2026  . COLONOSCOPY  09/20/2028  . DEXA SCAN  Completed  . COVID-19 Vaccine  Completed  . Hepatitis C Screening  Completed    The following portions of the patient's history were reviewed and updated as appropriate: allergies, current medications, past family history, past medical history, past social history, past surgical history and problem list.  Review of Systems Pertinent items noted in HPI and remainder of comprehensive ROS otherwise negative.   Objective:    BP (!) 142/88 (BP Location: Left Arm, Patient Position: Sitting, Cuff Size: Large)   Pulse (!) 102   Temp 98.1 F (36.7 C) (Oral)   Ht 5' (1.524 m)   Wt 231 lb 6.4 oz (105 kg)   SpO2 98%   BMI 45.19 kg/m  General appearance: alert, cooperative and no distress Head: Normocephalic, without obvious abnormality, atraumatic Eyes: conjunctivae/corneas clear. PERRL, EOM's intact. Fundi benign. Ears: normal TM's and external ear canals both ears after irrigation Nose: Nares normal. Septum midline. Mucosa normal. No drainage or sinus tenderness. Throat: lips, mucosa, and tongue normal; teeth and gums normal Neck: no adenopathy, no carotid bruit, no JVD, supple, symmetrical, trachea midline and thyroid not enlarged, symmetric, no tenderness/mass/nodules Lungs: clear to auscultation bilaterally Heart: regular rate and rhythm, S1, S2 normal, no murmur, click, rub or gallop Abdomen: soft, non-tender; bowel sounds normal; no masses,  no organomegaly Extremities: extremities normal, atraumatic, no cyanosis or  edema Pulses: 2+ and symmetric Skin: Skin color, texture, turgor normal. No rashes or lesions Lymph nodes: Cervical, supraclavicular, and axillary nodes normal. Neurologic: Alert and oriented X 3, normal strength and tone. Normal symmetric reflexes. Normal coordination and gait    Assessment:    Healthy female exam.     Plan:     Anticipatory guidance given including wearing seatbelts, smoke detectors in the home, increasing physical activity, increasing p.o. intake of water and vegetables. -We will obtain labs -Mammogram scheduled 10/2020 -Colonoscopy up-to-date done 09/20/2018 -Given handout -Next CPE in 1 year See After Visit Summary for Counseling Recommendations   Primary osteoarthritis of both knees -Continue follow-up with Ortho - Plan: CBC with Differential/Platelet, Vitamin D, 25-hydroxy  Impacted cerumen of right ear Consent obtained.  Ear irrigated.  Patient tolerated procedure well. -Consider Debrox eardrops -Given handout  Class 3 severe obesity due to excess calories with serious comorbidity and body mass index (BMI) of 45.0 to 49.9 in adult [facility])  -discussed increasing physical activity and other life style modifications - Plan: Lipid panel, Hemoglobin A1c, TSH, T4, free, CMP with eGFR([facility])  Decreased hearing of right ear  - Plan: Ambulatory referral to Audiology  Follow-up as needed

## 2020-09-01 NOTE — Patient Instructions (Addendum)
Preventive Care 51 Years and Older, Female Preventive care refers to lifestyle choices and visits with your health care provider that can promote health and wellness. This includes:  A yearly physical exam. This is also called an annual well check.  Regular dental and eye exams.  Immunizations.  Screening for certain conditions.  Healthy lifestyle choices, such as diet and exercise. What can I expect for my preventive care visit? Physical exam Your health care provider will check:  Height and weight. These may be used to calculate body mass index (BMI), which is a measurement that tells if you are at a healthy weight.  Heart rate and blood pressure.  Your skin for abnormal spots. Counseling Your health care provider may ask you questions about:  Alcohol, tobacco, and drug use.  Emotional well-being.  Home and relationship well-being.  Sexual activity.  Eating habits.  History of falls.  Memory and ability to understand (cognition).  Work and work Statistician.  Pregnancy and menstrual history. What immunizations do I need?  Influenza (flu) vaccine  This is recommended every year. Tetanus, diphtheria, and pertussis (Tdap) vaccine  You may need a Td booster every 10 years. Varicella (chickenpox) vaccine  You may need this vaccine if you have not already been vaccinated. Zoster (shingles) vaccine  You may need this after age 39. Pneumococcal conjugate (PCV13) vaccine  One dose is recommended after age 71. Pneumococcal polysaccharide (PPSV23) vaccine  One dose is recommended after age 13. Measles, mumps, and rubella (MMR) vaccine  You may need at least one dose of MMR if you were born in 1957 or later. You may also need a second dose. Meningococcal conjugate (MenACWY) vaccine  You may need this if you have certain conditions. Hepatitis A vaccine  You may need this if you have certain conditions or if you travel or work in places where you may be exposed  to hepatitis A. Hepatitis B vaccine  You may need this if you have certain conditions or if you travel or work in places where you may be exposed to hepatitis B. Haemophilus influenzae type b (Hib) vaccine  You may need this if you have certain conditions. You may receive vaccines as individual doses or as more than one vaccine together in one shot (combination vaccines). Talk with your health care provider about the risks and benefits of combination vaccines. What tests do I need? Blood tests  Lipid and cholesterol levels. These may be checked every 5 years, or more frequently depending on your overall health.  Hepatitis C test.  Hepatitis B test. Screening  Lung cancer screening. You may have this screening every year starting at age 71 if you have a 30-pack-year history of smoking and currently smoke or have quit within the past 15 years.  Colorectal cancer screening. All adults should have this screening starting at age 71 and continuing until age 4. Your health care provider may recommend screening at age 64 if you are at increased risk. You will have tests every 1-10 years, depending on your results and the type of screening test.  Diabetes screening. This is done by checking your blood sugar (glucose) after you have not eaten for a while (fasting). You may have this done every 1-3 years.  Mammogram. This may be done every 1-2 years. Talk with your health care provider about how often you should have regular mammograms.  BRCA-related cancer screening. This may be done if you have a family history of breast, ovarian, tubal, or peritoneal cancers.  Other tests  Sexually transmitted disease (STD) testing.  Bone density scan. This is done to screen for osteoporosis. You may have this done starting at age 31. Follow these instructions at home: Eating and drinking  Eat a diet that includes fresh fruits and vegetables, whole grains, lean protein, and low-fat dairy products. Limit  your intake of foods with high amounts of sugar, saturated fats, and salt.  Take vitamin and mineral supplements as recommended by your health care provider.  Do not drink alcohol if your health care provider tells you not to drink.  If you drink alcohol: ? Limit how much you have to 0-1 drink a day. ? Be aware of how much alcohol is in your drink. In the [place] one drink equals one 12 oz bottle of beer (355 mL), one 5 oz glass of wine (148 mL), or one 1 oz glass of hard liquor (44 mL). Lifestyle  Take daily care of your teeth and gums.  Stay active. Exercise for at least 30 minutes on 5 or more days each week.  Do not use any products that contain nicotine or tobacco, such as cigarettes, e-cigarettes, and chewing tobacco. If you need help quitting, ask your health care provider.  If you are sexually active, practice safe sex. Use a condom or other form of protection in order to prevent STIs (sexually transmitted infections).  Talk with your health care provider about taking a low-dose aspirin or statin. What's next?  Go to your health care provider once a year for a well check visit.  Ask your health care provider how often you should have your eyes and teeth checked.  Stay up to date on all vaccines. This information is not intended to replace advice given to you by your health care provider. Make sure you discuss any questions you have with your health care provider. Document Revised: 09/12/2018 Document Reviewed: 09/12/2018 Elsevier Patient Education  2020 [employer]  Osteoarthritis  Osteoarthritis is a type of arthritis that affects tissue that covers the ends of bones in joints (cartilage). Cartilage acts as a cushion between the bones and helps them move smoothly. Osteoarthritis results when cartilage in the joints gets worn down. Osteoarthritis is sometimes called "wear and tear" arthritis. Osteoarthritis is the most common form of arthritis. It often occurs in older  people. It is a condition that gets worse over time (a progressive condition). Joints that are most often affected by this condition are in:  Fingers.  Toes.  Hips.  Knees.  Spine, including neck and lower back. What are the causes? This condition is caused by age-related wearing down of cartilage that covers the ends of bones. What increases the risk? The following factors may make you more likely to develop this condition:  Older age.  Being overweight or obese.  Overuse of joints, such as in athletes.  Past injury of a joint.  Past surgery on a joint.  Family history of osteoarthritis. What are the signs or symptoms? The main symptoms of this condition are pain, swelling, and stiffness in the joint. The joint may lose its shape over time. Small pieces of bone or cartilage may break off and float inside of the joint, which may cause more pain and damage to the joint. Small deposits of bone (osteophytes) may grow on the edges of the joint. Other symptoms may include:  A grating or scraping feeling inside the joint when you move it.  Popping or creaking sounds when you move. Symptoms may affect  one or more joints. Osteoarthritis in a major joint, such as your knee or hip, can make it painful to walk or exercise. If you have osteoarthritis in your hands, you might not be able to grip items, twist your hand, or control small movements of your hands and fingers (fine motor skills). How is this diagnosed? This condition may be diagnosed based on:  Your medical history.  A physical exam.  Your symptoms.  X-rays of the affected joint(s).  Blood tests to rule out other types of arthritis. How is this treated? There is no cure for this condition, but treatment can help to control pain and improve joint function. Treatment plans may include:  A prescribed exercise program that allows for rest and joint relief. You may work with a physical therapist.  A weight control  plan.  Pain relief techniques, such as: ? Applying heat and cold to the joint. ? Electric pulses delivered to nerve endings under the skin (transcutaneous electrical nerve stimulation, or TENS). ? Massage. ? Certain nutritional supplements.  NSAIDs or prescription medicines to help relieve pain.  Medicine to help relieve pain and inflammation (corticosteroids). This can be given by mouth (orally) or as an injection.  Assistive devices, such as a brace, wrap, splint, specialized glove, or cane.  Surgery, such as: ? An osteotomy. This is done to reposition the bones and relieve pain or to remove loose pieces of bone and cartilage. ? Joint replacement surgery. You may need this surgery if you have very bad (advanced) osteoarthritis. Follow these instructions at home: Activity  Rest your affected joints as directed by your health care provider.  Do not drive or use heavy machinery while taking prescription pain medicine.  Exercise as directed. Your health care provider or physical therapist may recommend specific types of exercise, such as: ? Strengthening exercises. These are done to strengthen the muscles that support joints that are affected by arthritis. They can be performed with weights or with exercise bands to add resistance. ? Aerobic activities. These are exercises, such as brisk walking or water aerobics, that get your heart pumping. ? Range-of-motion activities. These keep your joints easy to move. ? Balance and agility exercises. Managing pain, stiffness, and swelling      If directed, apply heat to the affected area as often as told by your health care provider. Use the heat source that your health care provider recommends, such as a moist heat pack or a heating pad. ? If you have a removable assistive device, remove it as told by your health care provider. ? Place a towel between your skin and the heat source. If your health care provider tells you to keep the assistive  device on while you apply heat, place a towel between the assistive device and the heat source. ? Leave the heat on for 20-30 minutes. ? Remove the heat if your skin turns bright red. This is especially important if you are unable to feel pain, heat, or cold. You may have a greater risk of getting burned.  If directed, put ice on the affected joint: ? If you have a removable assistive device, remove it as told by your health care provider. ? Put ice in a plastic bag. ? Place a towel between your skin and the bag. If your health care provider tells you to keep the assistive device on during icing, place a towel between the assistive device and the bag. ? Leave the ice on for 20 minutes, 2-3 times a  day. General instructions  Take over-the-counter and prescription medicines only as told by your health care provider.  Maintain a healthy weight. Follow instructions from your health care provider for weight control. These may include dietary restrictions.  Do not use any products that contain nicotine or tobacco, such as cigarettes and e-cigarettes. These can delay bone healing. If you need help quitting, ask your health care provider.  Use assistive devices as directed by your health care provider.  Keep all follow-up visits as told by your health care provider. This is important. Where to find more information  [employer] of Arthritis and Musculoskeletal and Skin Diseases: www.niams.SouthExposed.es  [employer] on Aging: http://kim-miller.com/  American College of Rheumatology: www.rheumatology.org Contact a health care provider if:  Your skin turns red.  You develop a rash.  You have pain that gets worse.  You have a fever along with joint or muscle aches. Get help right away if:  You lose a lot of weight.  You suddenly lose your appetite.  You have night sweats. Summary  Osteoarthritis is a type of arthritis that affects tissue covering the ends of bones in joints  (cartilage).  This condition is caused by age-related wearing down of cartilage that covers the ends of bones.  The main symptom of this condition is pain, swelling, and stiffness in the joint.  There is no cure for this condition, but treatment can help to control pain and improve joint function. This information is not intended to replace advice given to you by your health care provider. Make sure you discuss any questions you have with your health care provider. Document Revised: 08/31/2017 Document Reviewed: 05/22/2016 Elsevier Patient Education  2020 [place], Adult The ears produce a substance called earwax that helps keep bacteria out of the ear and protects the skin in the ear canal. Occasionally, earwax can build up in the ear and cause discomfort or hearing loss. What increases the risk? This condition is more likely to develop in people who:  Are female.  Are elderly.  Naturally produce more earwax.  Clean their ears often with cotton swabs.  Use earplugs often.  Use in-ear headphones often.  Wear hearing aids.  Have narrow ear canals.  Have earwax that is overly thick or sticky.  Have eczema.  Are dehydrated.  Have excess hair in the ear canal. What are the signs or symptoms? Symptoms of this condition include:  Reduced or muffled hearing.  A feeling of fullness in the ear or feeling that the ear is plugged.  Fluid coming from the ear.  Ear pain.  Ear itch.  Ringing in the ear.  Coughing.  An obvious piece of earwax that can be seen inside the ear canal. How is this diagnosed? This condition may be diagnosed based on:  Your symptoms.  Your medical history.  An ear exam. During the exam, your health care provider will look into your ear with an instrument called an otoscope. You may have tests, including a hearing test. How is this treated? This condition may be treated by:  Using ear drops to soften the  earwax.  Having the earwax removed by a health care provider. The health care provider may: ? Flush the ear with water. ? Use an instrument that has a loop on the end (curette). ? Use a suction device.  Surgery to remove the wax buildup. This may be done in severe cases. Follow these instructions at home:   Take over-the-counter and  prescription medicines only as told by your health care provider.  Do not put any objects, including cotton swabs, into your ear. You can clean the opening of your ear canal with a washcloth or facial tissue.  Follow instructions from your health care provider about cleaning your ears. Do not over-clean your ears.  Drink enough fluid to keep your urine clear or pale yellow. This will help to thin the earwax.  Keep all follow-up visits as told by your health care provider. If earwax builds up in your ears often or if you use hearing aids, consider seeing your health care provider for routine, preventive ear cleanings. Ask your health care provider how often you should schedule your cleanings.  If you have hearing aids, clean them according to instructions from the manufacturer and your health care provider. Contact a health care provider if:  You have ear pain.  You develop a fever.  You have blood, pus, or other fluid coming from your ear.  You have hearing loss.  You have ringing in your ears that does not go away.  Your symptoms do not improve with treatment.  You feel like the room is spinning (vertigo). Summary  Earwax can build up in the ear and cause discomfort or hearing loss.  The most common symptoms of this condition include reduced or muffled hearing and a feeling of fullness in the ear or feeling that the ear is plugged.  This condition may be diagnosed based on your symptoms, your medical history, and an ear exam.  This condition may be treated by using ear drops to soften the earwax or by having the earwax removed by a health  care provider.  Do not put any objects, including cotton swabs, into your ear. You can clean the opening of your ear canal with a washcloth or facial tissue. This information is not intended to replace advice given to you by your health care provider. Make sure you discuss any questions you have with your health care provider. Document Revised: 08/31/2017 Document Reviewed: 11/29/2016 Elsevier Patient Education  2020 [place] for [employer] Calories are units of energy. Your body needs a certain amount of calories from food to keep you going throughout the day. When you eat more calories than your body needs, your body stores the extra calories as fat. When you eat fewer calories than your body needs, your body burns fat to get the energy it needs. Calorie counting means keeping track of how many calories you eat and drink each day. Calorie counting can be helpful if you need to lose weight. If you make sure to eat fewer calories than your body needs, you should lose weight. Ask your health care provider what a healthy weight is for you. For calorie counting to work, you will need to eat the right number of calories in a day in order to lose a healthy amount of weight per week. A dietitian can help you determine how many calories you need in a day and will give you suggestions on how to reach your calorie goal.  A healthy amount of weight to lose per week is usually 1-2 lb (0.5-0.9 kg). This usually means that your daily calorie intake should be reduced by 500-750 calories.  Eating 1,200 - 1,500 calories per day can help most women lose weight.  Eating 1,500 - 1,800 calories per day can help most men lose weight. What is my plan? My goal is to have __________ calories  per day. If I have this many calories per day, I should lose around __________ pounds per week. What do I need to know about calorie counting? In order to meet your daily calorie goal, you will need  to:  Find out how many calories are in each food you would like to eat. Try to do this before you eat.  Decide how much of the food you plan to eat.  Write down what you ate and how many calories it had. Doing this is called keeping a food log. To successfully lose weight, it is important to balance calorie counting with a healthy lifestyle that includes regular activity. Aim for 150 minutes of moderate exercise (such as walking) or 75 minutes of vigorous exercise (such as running) each week. Where do I find calorie information?  The number of calories in a food can be found on a Nutrition Facts label. If a food does not have a Nutrition Facts label, try to look up the calories online or ask your dietitian for help. Remember that calories are listed per serving. If you choose to have more than one serving of a food, you will have to multiply the calories per serving by the amount of servings you plan to eat. For example, the label on a package of bread might say that a serving size is 1 slice and that there are 90 calories in a serving. If you eat 1 slice, you will have eaten 90 calories. If you eat 2 slices, you will have eaten 180 calories. How do I keep a food log? Immediately after each meal, record the following information in your food log:  What you ate. Don't forget to include toppings, sauces, and other extras on the food.  How much you ate. This can be measured in cups, ounces, or number of items.  How many calories each food and drink had.  The total number of calories in the meal. Keep your food log near you, such as in a small notebook in your pocket, or use a mobile app or website. Some programs will calculate calories for you and show you how many calories you have left for the day to meet your goal. What are some calorie counting tips?   Use your calories on foods and drinks that will fill you up and not leave you hungry: ? Some examples of foods that fill you up are nuts  and nut butters, vegetables, lean proteins, and high-fiber foods like whole grains. High-fiber foods are foods with more than 5 g fiber per serving. ? Drinks such as sodas, specialty coffee drinks, alcohol, and juices have a lot of calories, yet do not fill you up.  Eat nutritious foods and avoid empty calories. Empty calories are calories you get from foods or beverages that do not have many vitamins or protein, such as candy, sweets, and soda. It is better to have a nutritious high-calorie food (such as an avocado) than a food with few nutrients (such as a bag of chips).  Know how many calories are in the foods you eat most often. This will help you calculate calorie counts faster.  Pay attention to calories in drinks. Low-calorie drinks include water and unsweetened drinks.  Pay attention to nutrition labels for "low fat" or "fat free" foods. These foods sometimes have the same amount of calories or more calories than the full fat versions. They also often have added sugar, starch, or salt, to make up for flavor that was removed  with the fat.  Find a way of tracking calories that works for you. Get creative. Try different apps or programs if writing down calories does not work for you. What are some portion control tips?  Know how many calories are in a serving. This will help you know how many servings of a certain food you can have.  Use a measuring cup to measure serving sizes. You could also try weighing out portions on a kitchen scale. With time, you will be able to estimate serving sizes for some foods.  Take some time to put servings of different foods on your favorite plates, bowls, and cups so you know what a serving looks like.  Try not to eat straight from a bag or box. Doing this can lead to overeating. Put the amount you would like to eat in a cup or on a plate to make sure you are eating the right portion.  Use smaller plates, glasses, and bowls to prevent overeating.  Try  not to multitask (for example, watch TV or use your computer) while eating. If it is time to eat, sit down at a table and enjoy your food. This will help you to know when you are full. It will also help you to be aware of what you are eating and how much you are eating. What are tips for following this plan? Reading food labels  Check the calorie count compared to the serving size. The serving size may be smaller than what you are used to eating.  Check the source of the calories. Make sure the food you are eating is high in vitamins and protein and low in saturated and trans fats. Shopping  Read nutrition labels while you shop. This will help you make healthy decisions before you decide to purchase your food.  Make a grocery list and stick to it. Cooking  Try to cook your favorite foods in a healthier way. For example, try baking instead of frying.  Use low-fat dairy products. Meal planning  Use more fruits and vegetables. Half of your plate should be fruits and vegetables.  Include lean proteins like poultry and fish. How do I count calories when eating out?  Ask for smaller portion sizes.  Consider sharing an entree and sides instead of getting your own entree.  If you get your own entree, eat only half. Ask for a box at the beginning of your meal and put the rest of your entree in it so you are not tempted to eat it.  If calories are listed on the menu, choose the lower calorie options.  Choose dishes that include vegetables, fruits, whole grains, low-fat dairy products, and lean protein.  Choose items that are boiled, broiled, grilled, or steamed. Stay away from items that are buttered, battered, fried, or served with cream sauce. Items labeled "crispy" are usually fried, unless stated otherwise.  Choose water, low-fat milk, unsweetened iced tea, or other drinks without added sugar. If you want an alcoholic beverage, choose a lower calorie option such as a glass of wine or  light beer.  Ask for dressings, sauces, and syrups on the side. These are usually high in calories, so you should limit the amount you eat.  If you want a salad, choose a garden salad and ask for grilled meats. Avoid extra toppings like bacon, cheese, or fried items. Ask for the dressing on the side, or ask for olive oil and vinegar or lemon to use as dressing.  Estimate how  many servings of a food you are given. For example, a serving of cooked rice is  cup or about the size of half a baseball. Knowing serving sizes will help you be aware of how much food you are eating at restaurants. The list below tells you how big or small some common portion sizes are based on everyday objects: ? 1 oz--4 stacked dice. ? 3 oz--1 deck of cards. ? 1 tsp--1 die. ? 1 Tbsp-- a ping-pong ball. ? 2 Tbsp--1 ping-pong ball. ?  cup-- baseball. ? 1 cup--1 baseball. Summary  Calorie counting means keeping track of how many calories you eat and drink each day. If you eat fewer calories than your body needs, you should lose weight.  A healthy amount of weight to lose per week is usually 1-2 lb (0.5-0.9 kg). This usually means reducing your daily calorie intake by 500-750 calories.  The number of calories in a food can be found on a Nutrition Facts label. If a food does not have a Nutrition Facts label, try to look up the calories online or ask your dietitian for help.  Use your calories on foods and drinks that will fill you up, and not on foods and drinks that will leave you hungry.  Use smaller plates, glasses, and bowls to prevent overeating. This information is not intended to replace advice given to you by your health care provider. Make sure you discuss any questions you have with your health care provider. Document Revised: 06/07/2018 Document Reviewed: 08/18/2016 Elsevier Patient Education  [place].

## 2020-09-02 LAB — CBC WITH DIFFERENTIAL/PLATELET
Absolute Monocytes: 621 cells/uL (ref 200–950)
Basophils Absolute: 68 cells/uL (ref 0–200)
Basophils Relative: 1.2 %
Eosinophils Absolute: 314 cells/uL (ref 15–500)
Eosinophils Relative: 5.5 %
HCT: 39 % (ref 35.0–45.0)
Hemoglobin: 12.5 g/dL (ref 11.7–15.5)
Lymphs Abs: 1938 cells/uL (ref 850–3900)
MCH: 30.3 pg (ref 27.0–33.0)
MCHC: 32.1 g/dL (ref 32.0–36.0)
MCV: 94.4 fL (ref 80.0–100.0)
MPV: 10.9 fL (ref 7.5–12.5)
Monocytes Relative: 10.9 %
Neutro Abs: 2759 cells/uL (ref 1500–7800)
Neutrophils Relative %: 48.4 %
Platelets: 324 10*3/uL (ref 140–400)
RBC: 4.13 10*6/uL (ref 3.80–5.10)
RDW: 12.9 % (ref 11.0–15.0)
Total Lymphocyte: 34 %
WBC: 5.7 10*3/uL (ref 3.8–10.8)

## 2020-09-02 LAB — COMPLETE METABOLIC PANEL WITH GFR
AG Ratio: 1.4 (calc) (ref 1.0–2.5)
ALT: 12 U/L (ref 6–29)
AST: 16 U/L (ref 10–35)
Albumin: 4 g/dL (ref 3.6–5.1)
Alkaline phosphatase (APISO): 75 U/L (ref 37–153)
BUN/Creatinine Ratio: 19 (calc) (ref 6–22)
BUN: 11 mg/dL (ref 7–25)
CO2: 28 mmol/L (ref 20–32)
Calcium: 8.9 mg/dL (ref 8.6–10.4)
Chloride: 107 mmol/L (ref 98–110)
Creat: 0.58 mg/dL — ABNORMAL LOW (ref 0.60–0.93)
GFR, Est African American: 107 mL/min/{1.73_m2} (ref >=60)
GFR, Est Non African American: 93 mL/min/{1.73_m2} (ref >=60)
Globulin: 2.9 g/dL (calc) (ref 1.9–3.7)
Glucose, Bld: 94 mg/dL (ref 65–99)
Potassium: 4.3 mmol/L (ref 3.5–5.3)
Sodium: 141 mmol/L (ref 135–146)
Total Bilirubin: 0.4 mg/dL (ref 0.2–1.2)
Total Protein: 6.9 g/dL (ref 6.1–8.1)

## 2020-09-02 LAB — VITAMIN D 25 HYDROXY (VIT D DEFICIENCY, FRACTURES): Vit D, 25-Hydroxy: 21 ng/mL — ABNORMAL LOW (ref 30–100)

## 2020-09-02 LAB — LIPID PANEL
Cholesterol: 188 mg/dL (ref <200)
HDL: 64 mg/dL (ref >=50)
LDL Cholesterol (Calc): 110 mg/dL (calc) — ABNORMAL HIGH
Non-HDL Cholesterol (Calc): 124 mg/dL (calc) (ref <130)
Total CHOL/HDL Ratio: 2.9 (calc) (ref <5.0)
Triglycerides: 54 mg/dL (ref <150)

## 2020-09-02 LAB — HEMOGLOBIN A1C
Hgb A1c MFr Bld: 5.6 % of total Hgb (ref <5.7)
Mean Plasma Glucose: 114 (calc)
eAG (mmol/L): 6.3 (calc)

## 2020-09-02 LAB — TSH: TSH: 1.14 mIU/L (ref 0.40–4.50)

## 2020-09-02 LAB — T4, FREE: Free T4: 1 ng/dL (ref 0.8–1.8)

## 2020-09-05 ENCOUNTER — Other Ambulatory Visit: Payer: Self-pay | Admitting: Family Medicine

## 2020-09-05 DIAGNOSIS — E559 Vitamin D deficiency, unspecified: Secondary | ICD-10-CM

## 2020-09-05 MED ORDER — VITAMIN D (ERGOCALCIFEROL) 1.25 MG (50000 UNIT) PO CAPS: 50000.0000 [IU] | ORAL_CAPSULE | ORAL | 0 refills | Status: DC

## 2020-09-06 ENCOUNTER — Other Ambulatory Visit: Payer: Self-pay

## 2020-09-06 ENCOUNTER — Ambulatory Visit (INDEPENDENT_AMBULATORY_CARE_PROVIDER_SITE_OTHER): Payer: Medicare PPO | Admitting: Orthopedic Surgery

## 2020-09-06 DIAGNOSIS — M1711 Unilateral primary osteoarthritis, right knee: Secondary | ICD-10-CM

## 2020-09-06 DIAGNOSIS — M1712 Unilateral primary osteoarthritis, left knee: Secondary | ICD-10-CM

## 2020-09-08 ENCOUNTER — Encounter: Payer: Self-pay | Admitting: Family Medicine

## 2020-09-10 ENCOUNTER — Encounter: Payer: Self-pay | Admitting: Orthopedic Surgery

## 2020-09-10 ENCOUNTER — Telehealth: Payer: Self-pay

## 2020-09-10 DIAGNOSIS — M170 Bilateral primary osteoarthritis of knee: Secondary | ICD-10-CM

## 2020-09-10 DIAGNOSIS — M1712 Unilateral primary osteoarthritis, left knee: Secondary | ICD-10-CM

## 2020-09-10 DIAGNOSIS — M1711 Unilateral primary osteoarthritis, right knee: Secondary | ICD-10-CM

## 2020-09-10 MED ORDER — HYALURONAN 88 MG/4ML IX SOSY
88.0000 mg | PREFILLED_SYRINGE | INTRA_ARTICULAR | Status: AC | PRN
  Administered 2020-09-10: 88 mg via INTRA_ARTICULAR

## 2020-09-10 MED ORDER — LIDOCAINE HCL 1 % IJ SOLN
5.0000 mL | INTRAMUSCULAR | Status: AC | PRN
  Administered 2020-09-10: 5 mL

## 2020-09-10 NOTE — Telephone Encounter (Signed)
Bilateral knee gel injections 

## 2020-09-10 NOTE — Progress Notes (Signed)
Office Visit Note   Patient: [name]           Date of Birth: 1951-07-07           MRN: [id number] Visit Date: 09/06/2020 Requested by: Victoria Ruddy, MD [place],  [place] [id number] PCP: Victoria Ruddy, MD  Subjective: Chief Complaint  Patient presents with  . Right Knee - Pain    Monovisc Injection  . 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  She has been managing her knee arthritis with occasional medication and activity modification.  She is not having any night pain or rest pain.  She does have pain with ambulation.  Denies any mechanical symptoms in the knee.  Last had knee injections in March of this year which gave her some relief.  Presenting now for repeat gel injections.              ROS: All systems reviewed are negative as they relate to the chief complaint within the history of present illness.  Patient denies  fevers or chills.   Assessment & Plan: Visit Diagnoses: No diagnosis found.  Plan: Impression is bilateral knee arthritis.  Not having much in the way of night pain or rest pain.  She wants to delay knee replacement as long as possible.  She is having good results with the gel injection.  Repeat Monovisc injections performed today and we will see her back as needed for further injections when symptoms recur. This patient is diagnosed with osteoarthritis of the knee(s).    Radiographs show evidence of joint space narrowing, osteophytes, subchondral sclerosis and/or subchondral cysts.  This patient has knee pain which interferes with functional and activities of daily living.    This patient has experienced inadequate response, adverse effects and/or intolerance with conservative treatments such as acetaminophen, NSAIDS, topical creams, physical therapy or regular exercise, knee bracing and/or weight loss.   This patient has experienced inadequate response or has  a contraindication to intra articular steroid injections for at least 3 months.   This patient is not scheduled to have a total knee replacement within 6 months of starting treatment with viscosupplementation.   Follow-Up Instructions: Return if symptoms worsen or fail to improve.   Orders:  No orders of the defined types were placed in this encounter.  No orders of the defined types were placed in this encounter.     Procedures: Large Joint Inj: bilateral knee on 09/10/2020 7:33 AM Indications: diagnostic evaluation, joint swelling and pain Details: 18 G 1.5 in needle, superolateral approach  Arthrogram: No  Medications (Right): 5 mL lidocaine 1 %; 88 mg Hyaluronan 88 MG/4ML Medications (Left): 5 mL lidocaine 1 %; 88 mg Hyaluronan 88 MG/4ML Outcome: tolerated well, no immediate complications Procedure, treatment alternatives, risks and benefits explained, specific risks discussed. Consent was given by the patient. Immediately prior to procedure a time out was called to verify the correct patient, procedure, equipment, support staff and site/side marked as required. Patient was prepped and draped in the usual sterile fashion.       Clinical Data: No additional findings.  Objective: Vital Signs: There were no vitals taken for this visit.  Physical Exam:   Constitutional: Patient appears well-developed HEENT:  Head: Normocephalic Eyes:EOM are normal Neck: Normal range of motion Cardiovascular: Normal rate Pulmonary/chest: Effort normal Neurologic: Patient is alert Skin: Skin is warm Psychiatric: Patient  has normal mood and affect    Ortho Exam: Ortho exam demonstrates about a 5 degree flexion contracture bilaterally.  She is able to flex just past 90 degrees in both knees.  No groin pain with internal X rotation of the leg.  Mild patellofemoral crepitus is present.  Extensor mechanism is intact.  Pedal pulses palpable.  Specialty Comments:  No specialty comments  available.  Imaging: No results found.   PMFS History: Patient Active Problem List   Diagnosis Date Noted  . Chronic pain of right knee 08/24/2018  . Fall in home 07/14/2015  . TIBIALIS TENDINITIS 08/25/2009  . CHEST PAIN, OTHER, PAIN 10/21/2008  . PEDAL EDEMA 07/22/2008  . ANKLE PAIN, BILATERAL 04/01/2008  . LOW BACK PAIN 04/01/2008  . ACUTE BRONCHITIS 09/23/2007  . SARCOIDOSIS 09/02/2007  . Asthma 09/02/2007  . Diverticulosis of colon 07/11/2007  . BACK PAIN, CHRONIC 07/11/2007   Past Medical History:  Diagnosis Date  . Acute bronchitis   . Allergy   . Anxiety   . Arthritis    knees  . Asthma   . Backache, unspecified   . Cataract    removed both eyes  . Depression   . Diverticulitis   . Diverticulosis of colon (without mention of hemorrhage)   . Edema   . Glaucoma   . Lumbago   . Obesity   . Other chest pain   . Pain in joint, ankle and foot   . Sarcoidosis   . Tibialis tendinitis   . Unspecified asthma(493.90)     Family History  Problem Relation Age of Onset  . Hypertension Mother   . Stroke Mother   . Diabetes Father   . Sarcoidosis Sister   . Cancer Brother        prostate  . Colon cancer Neg Hx   . Colon polyps Neg Hx   . Esophageal cancer Neg Hx   . Rectal cancer Neg Hx   . Stomach cancer Neg Hx     Past Surgical History:  Procedure Laterality Date  . ABDOMINAL HYSTERECTOMY    . CATARACT EXTRACTION W/PHACO Right 06/24/2014   Procedure: CATARACT EXTRACTION PHACO AND INTRAOCULAR LENS PLACEMENT (IOC) RIGHT EYE WITH GONIOSYNECHIALYSIS;  Surgeon: Marylynn Pearson, MD;  Location: [facility];  Service: Ophthalmology;  Laterality: Right;  . COLONOSCOPY    . EYE SURGERY Left    cataract surgery  . HAND SURGERY    . IR RADIOLOGIST EVAL & MGMT  11/14/2018  . KNEE ARTHROSCOPY Right    Social History   Occupational History  . Not on file  Tobacco Use  . Smoking status: Never Smoker  . Smokeless tobacco: Never Used  Substance and Sexual Activity  .  Alcohol use: Yes    Alcohol/week: 1.0 standard drink    Types: 1 Standard drinks or equivalent per week  . Drug use: No  . Sexual activity: Not on file

## 2020-09-13 ENCOUNTER — Encounter: Payer: Self-pay | Admitting: Podiatry

## 2020-09-13 ENCOUNTER — Ambulatory Visit: Payer: Medicare PPO | Admitting: Podiatry

## 2020-09-13 ENCOUNTER — Other Ambulatory Visit: Payer: Self-pay

## 2020-09-13 DIAGNOSIS — L608 Other nail disorders: Secondary | ICD-10-CM | POA: Insufficient documentation

## 2020-09-13 DIAGNOSIS — M79673 Pain in unspecified foot: Secondary | ICD-10-CM | POA: Insufficient documentation

## 2020-09-13 DIAGNOSIS — M79674 Pain in right toe(s): Secondary | ICD-10-CM

## 2020-09-13 DIAGNOSIS — M79675 Pain in left toe(s): Secondary | ICD-10-CM

## 2020-09-13 DIAGNOSIS — B351 Tinea unguium: Secondary | ICD-10-CM | POA: Diagnosis not present

## 2020-09-13 NOTE — Progress Notes (Signed)
This patient presents  to the office for evaluation and treatment of long thick ingrowing  painful toenails .  This patient is unable to trim her own nails since the patient cannot reach her feet.  Patient says the nails are painful walking and wearing his shoes.  She returns for preventive foot care services.  General Appearance  Alert, conversant and in no acute stress.  Vascular  Dorsalis pedis and posterior tibial  pulses are palpable  bilaterally.  Capillary return is within normal limits  bilaterally. Temperature is within normal limits  bilaterally.  Neurologic  Senn-Weinstein monofilament wire test within normal limits  bilaterally. Muscle power within normal limits bilaterally.  Nails Thick disfigured discolored nails with subungual debris  from hallux to fifth toes bilaterally. No evidence of bacterial infection or drainage bilaterally.  Pincer hallux nails  B/L  Orthopedic  No limitations of motion  feet .  No crepitus or effusions noted.  No bony pathology or digital deformities noted.  Skin  normotropic skin with no porokeratosis noted bilaterally.  No signs of infections or ulcers noted.     Onychomycosis  Pain in toes right foot  Pain in toes left foot  Debridement  of nails  1-5  B/L with a nail nipper.  Nails were then filed using a dremel tool with no incidents.    RTC   4 months    Holle Sprick DPM  

## 2020-09-14 NOTE — Telephone Encounter (Signed)
Noted  

## 2020-10-25 DIAGNOSIS — Z822 Family history of deafness and hearing loss: Secondary | ICD-10-CM | POA: Diagnosis not present

## 2020-10-25 DIAGNOSIS — H9311 Tinnitus, right ear: Secondary | ICD-10-CM | POA: Diagnosis not present

## 2020-10-25 DIAGNOSIS — H903 Sensorineural hearing loss, bilateral: Secondary | ICD-10-CM | POA: Diagnosis not present

## 2020-10-25 DIAGNOSIS — Z77122 Contact with and (suspected) exposure to noise: Secondary | ICD-10-CM | POA: Diagnosis not present

## 2020-11-17 ENCOUNTER — Encounter: Payer: Self-pay | Admitting: Family Medicine

## 2020-11-21 ENCOUNTER — Other Ambulatory Visit: Payer: Self-pay | Admitting: Surgical

## 2020-11-21 ENCOUNTER — Other Ambulatory Visit: Payer: Self-pay | Admitting: Family Medicine

## 2020-11-21 DIAGNOSIS — E559 Vitamin D deficiency, unspecified: Secondary | ICD-10-CM

## 2020-11-24 ENCOUNTER — Encounter: Payer: Self-pay | Admitting: Family Medicine

## 2020-11-24 NOTE — Telephone Encounter (Signed)
Pt would like a call ,she would like to know if [name] answered her [name] message and have question about her refill for Vit D please call [phone]

## 2020-11-25 NOTE — Telephone Encounter (Signed)
Pt is calling in to see if she can get a call back b/c she stated that she has sent several msgs to her provider and she is aware that it is on the providers desktop to be addressed.  Pt would like to see if she can get a call back today.

## 2020-11-26 ENCOUNTER — Telehealth: Payer: Self-pay

## 2020-11-26 ENCOUNTER — Encounter: Payer: Self-pay | Admitting: Family Medicine

## 2020-11-26 ENCOUNTER — Telehealth (INDEPENDENT_AMBULATORY_CARE_PROVIDER_SITE_OTHER): Payer: Medicare PPO | Admitting: Family Medicine

## 2020-11-26 VITALS — Wt 231.0 lb

## 2020-11-26 DIAGNOSIS — J3089 Other allergic rhinitis: Secondary | ICD-10-CM

## 2020-11-26 DIAGNOSIS — E559 Vitamin D deficiency, unspecified: Secondary | ICD-10-CM | POA: Diagnosis not present

## 2020-11-26 DIAGNOSIS — J4541 Moderate persistent asthma with (acute) exacerbation: Secondary | ICD-10-CM

## 2020-11-26 DIAGNOSIS — R0982 Postnasal drip: Secondary | ICD-10-CM | POA: Diagnosis not present

## 2020-11-26 MED ORDER — PREDNISONE 10 MG PO TABS: ORAL_TABLET | ORAL | 0 refills | Status: DC

## 2020-11-26 MED ORDER — LORATADINE 10 MG PO TABS: 10.0000 mg | ORAL_TABLET | Freq: Every day | ORAL | 11 refills | Status: DC

## 2020-11-26 MED ORDER — FLUTICASONE-SALMETEROL 250-50 MCG/DOSE IN AEPB: INHALATION_SPRAY | RESPIRATORY_TRACT | 1 refills | Status: DC

## 2020-11-26 MED ORDER — ALBUTEROL SULFATE HFA 108 (90 BASE) MCG/ACT IN AERS: INHALATION_SPRAY | RESPIRATORY_TRACT | 3 refills | Status: DC

## 2020-11-26 NOTE — Telephone Encounter (Signed)
Sent message to Dr. Volanda Napoleon, waiting on advice.   Appointment was scheduled for 11/26/2020

## 2020-11-26 NOTE — Progress Notes (Signed)
Virtual Visit via Video Note  I connected with [name] on 11/26/20 at  3:30 PM EST by a video enabled telemedicine application 2/2 [id number] pandemic and verified that I am speaking with the correct person using two identifiers.  Location patient: home Location provider:work or home office Persons participating in the virtual visit: patient, provider  I discussed the limitations of evaluation and management by telemedicine and the availability of in person appointments. The patient expressed understanding and agreed to proceed.   HPI: Pt is a 70 yo female with pmh sig for asthma, diverticulosis, sarcoidosis, joint pain who was seen for ongoing concern.  Pt with a "cold" x 5 wks.  Used mucinex, albuterol and wiexela.  States symptoms get better then come back.  Endorses Wheezing, cough, rhinorrhea, post nasal drainage, itchy/watery eyes.  Pt notes symptoms worse in the building at work.  Pt has not seen mold at work, but has seen water damage.  Denies ear pain or pressure, n/v.  Pt has Claritin, but has not used it lately.  Started using Wixela several times per day.   ROS: See pertinent positives and negatives per HPI.  Past Medical History:  Diagnosis Date  . Acute bronchitis   . Allergy   . Anxiety   . Arthritis    knees  . Asthma   . Backache, unspecified   . Cataract    removed both eyes  . Depression   . Diverticulitis   . Diverticulosis of colon (without mention of hemorrhage)   . Edema   . Glaucoma   . Lumbago   . Obesity   . Other chest pain   . Pain in joint, ankle and foot   . Sarcoidosis   . Tibialis tendinitis   . Unspecified asthma(493.90)     Past Surgical History:  Procedure Laterality Date  . ABDOMINAL HYSTERECTOMY    . CATARACT EXTRACTION W/PHACO Right 06/24/2014   Procedure: CATARACT EXTRACTION PHACO AND INTRAOCULAR LENS PLACEMENT (IOC) RIGHT EYE WITH GONIOSYNECHIALYSIS;  Surgeon: Marylynn Pearson, MD;  Location: [facility];  Service: Ophthalmology;   Laterality: Right;  . COLONOSCOPY    . EYE SURGERY Left    cataract surgery  . HAND SURGERY    . IR RADIOLOGIST EVAL & MGMT  11/14/2018  . KNEE ARTHROSCOPY Right     Family History  Problem Relation Age of Onset  . Hypertension Mother   . Stroke Mother   . Diabetes Father   . Sarcoidosis Sister   . Cancer Brother        prostate  . Colon cancer Neg Hx   . Colon polyps Neg Hx   . Esophageal cancer Neg Hx   . Rectal cancer Neg Hx   . Stomach cancer Neg Hx     Current Outpatient Medications:  .  albuterol (VENTOLIN HFA) 108 (90 Base) MCG/ACT inhaler, TAKE 2 PUFFS BY MOUTH EVERY 6 HOURS AS NEEDED, Disp: 18 g, Rfl: 1 .  cycloSPORINE (RESTASIS) 0.05 % ophthalmic emulsion, Restasis 0.05 % eye drops in a dropperette, Disp: , Rfl:  .  estradiol (ESTRACE) 1 MG tablet, Take 1 tablet (1 mg total) by mouth daily., Disp: 90 tablet, Rfl: 3 .  FLUoxetine (PROZAC) 10 MG capsule, Take 10 mg by mouth daily., Disp: , Rfl:  .  Fluticasone-Salmeterol (WIXELA INHUB) 250-50 MCG/DOSE AEPB, INHALE 1 PUFF BY MOUTH TWICE A DAY, Disp: 120 each, Rfl: 1 .  Ginkgo Biloba (GINKOBA PO), Take 1 capsule by mouth daily., Disp: , Rfl:  .  meloxicam (MOBIC) 15 MG tablet, TAKE 1 TABLET BY MOUTH EVERY DAY AS NEEDED FOR PAIN, Disp: 30 tablet, Rfl: 0 .  Multiple Vitamins-Minerals (MULTIVITAMIN WITH MINERALS) tablet, Take 1 tablet by mouth daily., Disp: , Rfl:  .  OVER THE COUNTER MEDICATION, CBD Oil daily at bedtime, Disp: , Rfl:  .  Vitamin D, Ergocalciferol, (DRISDOL) 1.25 MG (50000 UNIT) CAPS capsule, Take 1 capsule (50,000 Units total) by mouth every 7 (seven) days., Disp: 12 capsule, Rfl: 0 .  ZIOPTAN 0.0015 % SOLN, Place 1 drop into both eyes at bedtime., Disp: , Rfl:   EXAM:  VITALS per patient if applicable: RR between 16-10 bpm  GENERAL: alert, oriented, appears well and in no acute distress  HEENT: atraumatic, conjunctiva clear, no obvious abnormalities on inspection of external nose and ears  NECK:  normal movements of the head and neck  LUNGS: Dry cough x1 during visit.  On inspection no signs of respiratory distress, breathing rate appears normal, no obvious gross SOB, gasping or wheezing  CV: no obvious cyanosis  MS: moves all visible extremities without noticeable abnormality  PSYCH/NEURO: pleasant and cooperative, no obvious depression or anxiety, speech and thought processing grossly intact  ASSESSMENT AND PLAN:  Discussed the following assessment and plan:  Post-nasal drainage - Plan: loratadine (CLARITIN) 10 MG tablet  Moderate persistent asthma with acute exacerbation -Discussed proper use of maintenance versus rescue inhalers. -For continued or worsening symptoms will place referral to pulmonology. - Plan: predniSONE (DELTASONE) 10 MG tablet, albuterol (VENTOLIN HFA) 108 (90 Base) MCG/ACT inhaler, Fluticasone-Salmeterol (WIXELA INHUB) 250-50 MCG/DOSE AEPB  Environmental and seasonal allergies -We will start Claritin daily. -Discussed nasal saline or Flonase if needed for continued symptoms. - Plan: loratadine (CLARITIN) 10 MG tablet  Vitamin D deficiency -Vitamin D 21 on 09/01/2020 -Patient completed ergocalciferol 50,000 IU x 12 weeks -Discussed taking OTC vitamin D 1000-2000 units daily and rechecking vitamin D -Order for vitamin D placed  Follow-up as needed   I discussed the assessment and treatment plan with the patient. The patient was provided an opportunity to ask questions and all were answered. The patient agreed with the plan and demonstrated an understanding of the instructions.   The patient was advised to call back or seek an in-person evaluation if the symptoms worsen or if the condition fails to improve as anticipated.   [name], MD

## 2020-11-26 NOTE — Telephone Encounter (Signed)
[name] replied to patient and advised a visit is needed. VV scheduled for 11/26/2020

## 2020-11-27 DIAGNOSIS — G8929 Other chronic pain: Secondary | ICD-10-CM | POA: Diagnosis not present

## 2020-11-27 DIAGNOSIS — F329 Major depressive disorder, single episode, unspecified: Secondary | ICD-10-CM | POA: Diagnosis not present

## 2020-11-27 DIAGNOSIS — Z780 Asymptomatic menopausal state: Secondary | ICD-10-CM | POA: Diagnosis not present

## 2020-11-27 DIAGNOSIS — R609 Edema, unspecified: Secondary | ICD-10-CM | POA: Diagnosis not present

## 2020-11-27 DIAGNOSIS — J45909 Unspecified asthma, uncomplicated: Secondary | ICD-10-CM | POA: Diagnosis not present

## 2020-11-27 DIAGNOSIS — M1990 Unspecified osteoarthritis, unspecified site: Secondary | ICD-10-CM | POA: Diagnosis not present

## 2020-11-27 DIAGNOSIS — Z791 Long term (current) use of non-steroidal anti-inflammatories (NSAID): Secondary | ICD-10-CM | POA: Diagnosis not present

## 2020-11-27 DIAGNOSIS — F419 Anxiety disorder, unspecified: Secondary | ICD-10-CM | POA: Diagnosis not present

## 2020-12-09 DIAGNOSIS — H9313 Tinnitus, bilateral: Secondary | ICD-10-CM | POA: Diagnosis not present

## 2020-12-09 DIAGNOSIS — H903 Sensorineural hearing loss, bilateral: Secondary | ICD-10-CM | POA: Diagnosis not present

## 2020-12-14 ENCOUNTER — Other Ambulatory Visit: Payer: Self-pay | Admitting: Otolaryngology

## 2020-12-14 DIAGNOSIS — H903 Sensorineural hearing loss, bilateral: Secondary | ICD-10-CM

## 2020-12-14 DIAGNOSIS — H918X9 Other specified hearing loss, unspecified ear: Secondary | ICD-10-CM

## 2020-12-15 DIAGNOSIS — Z1272 Encounter for screening for malignant neoplasm of vagina: Secondary | ICD-10-CM | POA: Diagnosis not present

## 2020-12-15 DIAGNOSIS — Z1231 Encounter for screening mammogram for malignant neoplasm of breast: Secondary | ICD-10-CM | POA: Diagnosis not present

## 2020-12-15 DIAGNOSIS — Z01419 Encounter for gynecological examination (general) (routine) without abnormal findings: Secondary | ICD-10-CM | POA: Diagnosis not present

## 2020-12-15 LAB — HM MAMMOGRAPHY: HM Mammogram: NORMAL (ref 0–4)

## 2020-12-17 ENCOUNTER — Other Ambulatory Visit: Payer: Self-pay | Admitting: Obstetrics and Gynecology

## 2020-12-17 DIAGNOSIS — R928 Other abnormal and inconclusive findings on diagnostic imaging of breast: Secondary | ICD-10-CM

## 2020-12-21 ENCOUNTER — Encounter: Payer: Self-pay | Admitting: Family Medicine

## 2021-01-03 ENCOUNTER — Other Ambulatory Visit: Payer: Self-pay | Admitting: Family Medicine

## 2021-01-03 ENCOUNTER — Other Ambulatory Visit: Payer: Self-pay | Admitting: Orthopedic Surgery

## 2021-01-03 DIAGNOSIS — E559 Vitamin D deficiency, unspecified: Secondary | ICD-10-CM

## 2021-01-03 NOTE — Telephone Encounter (Signed)
Please advise 

## 2021-01-05 ENCOUNTER — Ambulatory Visit
Admission: RE | Admit: 2021-01-05 | Discharge: 2021-01-05 | Disposition: A | Discharge status: Home / Self Care | Payer: Medicare PPO | Source: Ambulatory Visit | Attending: Obstetrics and Gynecology | Admitting: Obstetrics and Gynecology

## 2021-01-05 ENCOUNTER — Other Ambulatory Visit: Payer: Self-pay

## 2021-01-05 ENCOUNTER — Other Ambulatory Visit: Payer: Self-pay | Admitting: Obstetrics and Gynecology

## 2021-01-05 DIAGNOSIS — R921 Mammographic calcification found on diagnostic imaging of breast: Secondary | ICD-10-CM | POA: Diagnosis not present

## 2021-01-05 DIAGNOSIS — R922 Inconclusive mammogram: Secondary | ICD-10-CM | POA: Diagnosis not present

## 2021-01-05 DIAGNOSIS — R928 Other abnormal and inconclusive findings on diagnostic imaging of breast: Secondary | ICD-10-CM

## 2021-01-08 ENCOUNTER — Ambulatory Visit
Admission: RE | Admit: 2021-01-08 | Discharge: 2021-01-08 | Disposition: A | Discharge status: Home / Self Care | Payer: Medicare PPO | Source: Ambulatory Visit | Attending: Otolaryngology | Admitting: Otolaryngology

## 2021-01-08 DIAGNOSIS — H918X9 Other specified hearing loss, unspecified ear: Secondary | ICD-10-CM

## 2021-01-08 DIAGNOSIS — H9190 Unspecified hearing loss, unspecified ear: Secondary | ICD-10-CM | POA: Diagnosis not present

## 2021-01-08 MED ORDER — GADOBENATE DIMEGLUMINE 529 MG/ML IV SOLN
20.0000 mL | Freq: Once | INTRAVENOUS | Status: AC | PRN
  Administered 2021-01-08: 20 mL via INTRAVENOUS

## 2021-01-12 ENCOUNTER — Ambulatory Visit
Admission: RE | Admit: 2021-01-12 | Discharge: 2021-01-12 | Disposition: A | Discharge status: Home / Self Care | Payer: Medicare PPO | Source: Ambulatory Visit | Attending: Obstetrics and Gynecology | Admitting: Obstetrics and Gynecology

## 2021-01-12 ENCOUNTER — Other Ambulatory Visit: Payer: Self-pay | Admitting: Obstetrics and Gynecology

## 2021-01-12 ENCOUNTER — Other Ambulatory Visit: Payer: Self-pay

## 2021-01-12 DIAGNOSIS — R921 Mammographic calcification found on diagnostic imaging of breast: Secondary | ICD-10-CM | POA: Diagnosis not present

## 2021-01-12 DIAGNOSIS — N6489 Other specified disorders of breast: Secondary | ICD-10-CM | POA: Diagnosis not present

## 2021-01-13 ENCOUNTER — Encounter: Payer: Self-pay | Admitting: Podiatry

## 2021-01-13 ENCOUNTER — Ambulatory Visit: Payer: Medicare PPO | Admitting: Podiatry

## 2021-01-13 DIAGNOSIS — M79674 Pain in right toe(s): Secondary | ICD-10-CM

## 2021-01-13 DIAGNOSIS — B351 Tinea unguium: Secondary | ICD-10-CM

## 2021-01-13 DIAGNOSIS — M79675 Pain in left toe(s): Secondary | ICD-10-CM | POA: Diagnosis not present

## 2021-01-13 DIAGNOSIS — L608 Other nail disorders: Secondary | ICD-10-CM

## 2021-01-13 NOTE — Progress Notes (Signed)
This patient presents  to the office for evaluation and treatment of long thick ingrowing  painful toenails .  This patient is unable to trim her own nails since the patient cannot reach her feet.  Patient says the nails are painful walking and wearing his shoes.  She returns for preventive foot care services.  General Appearance  Alert, conversant and in no acute stress.  Vascular  Dorsalis pedis and posterior tibial  pulses are palpable  bilaterally.  Capillary return is within normal limits  bilaterally. Temperature is within normal limits  bilaterally.  Neurologic  Senn-Weinstein monofilament wire test within normal limits  bilaterally. Muscle power within normal limits bilaterally.  Nails Thick disfigured discolored nails with subungual debris  from hallux to fifth toes bilaterally. No evidence of bacterial infection or drainage bilaterally.  Pincer hallux nails  B/L  Orthopedic  No limitations of motion  feet .  No crepitus or effusions noted.  No bony pathology or digital deformities noted.  Skin  normotropic skin with no porokeratosis noted bilaterally.  No signs of infections or ulcers noted.     Onychomycosis  Pain in toes right foot  Pain in toes left foot  Debridement  of nails  1-5  B/L with a nail nipper.  Nails were then filed using a dremel tool with no incidents.    RTC   4 months    Gardiner Barefoot DPM

## 2021-01-18 ENCOUNTER — Ambulatory Visit: Payer: Medicare PPO

## 2021-01-19 ENCOUNTER — Other Ambulatory Visit: Payer: Self-pay

## 2021-01-19 ENCOUNTER — Ambulatory Visit (INDEPENDENT_AMBULATORY_CARE_PROVIDER_SITE_OTHER): Payer: Medicare PPO

## 2021-01-19 ENCOUNTER — Encounter: Payer: Self-pay | Admitting: Family Medicine

## 2021-01-19 DIAGNOSIS — Z0000 Encounter for general adult medical examination without abnormal findings: Secondary | ICD-10-CM | POA: Diagnosis not present

## 2021-01-19 NOTE — Patient Instructions (Addendum)
[name] , Thank you for taking time to come for your Medicare Wellness Visit. I appreciate your ongoing commitment to your health goals. Please review the following plan we discussed and let me know if I can assist you in the future.   Screening recommendations/referrals: Colonoscopy: Done 10/03/19 Mammogram: Done 01/12/21 Bone Density: Done 03/22/12 Recommended yearly ophthalmology/optometry visit for glaucoma screening and checkup Recommended yearly dental visit for hygiene and checkup  Vaccinations: Influenza vaccine: Up to date Pneumococcal vaccine: Up to date Tdap vaccine: Up to date Shingles vaccine: Completed 07/25/19 & 04/12/20   Covid-19:Completed 1/19, 2/8, 07/03/20 & 12/31/20  Advanced directives: Please bring a copy of your health care power of attorney and living will to the office at your convenience.  Conditions/risks identified: None at this time  Next appointment: Follow up in one year for your annual wellness visit    Preventive Care 65 Years and Older, Female Preventive care refers to lifestyle choices and visits with your health care provider that can promote health and wellness. What does preventive care include?  A yearly physical exam. This is also called an annual well check.  Dental exams once or twice a year.  Routine eye exams. Ask your health care provider how often you should have your eyes checked.  Personal lifestyle choices, including:  Daily care of your teeth and gums.  Regular physical activity.  Eating a healthy diet.  Avoiding tobacco and drug use.  Limiting alcohol use.  Practicing safe sex.  Taking low-dose aspirin every day.  Taking vitamin and mineral supplements as recommended by your health care provider. What happens during an annual well check? The services and screenings done by your health care provider during your annual well check will depend on your age, overall health, lifestyle risk factors, and family history of  disease. Counseling  Your health care provider may ask you questions about your:  Alcohol use.  Tobacco use.  Drug use.  Emotional well-being.  Home and relationship well-being.  Sexual activity.  Eating habits.  History of falls.  Memory and ability to understand (cognition).  Work and work Statistician.  Reproductive health. Screening  You may have the following tests or measurements:  Height, weight, and BMI.  Blood pressure.  Lipid and cholesterol levels. These may be checked every 5 years, or more frequently if you are over 67 years old.  Skin check.  Lung cancer screening. You may have this screening every year starting at age 72 if you have a 30-pack-year history of smoking and currently smoke or have quit within the past 15 years.  Fecal occult blood test (FOBT) of the stool. You may have this test every year starting at age 72.  Flexible sigmoidoscopy or colonoscopy. You may have a sigmoidoscopy every 5 years or a colonoscopy every 10 years starting at age 72.  Hepatitis C blood test.  Hepatitis B blood test.  Sexually transmitted disease (STD) testing.  Diabetes screening. This is done by checking your blood sugar (glucose) after you have not eaten for a while (fasting). You may have this done every 1-3 years.  Bone density scan. This is done to screen for osteoporosis. You may have this done starting at age 72.  Mammogram. This may be done every 1-2 years. Talk to your health care provider about how often you should have regular mammograms. Talk with your health care provider about your test results, treatment options, and if necessary, the need for more tests. Vaccines  Your health care  provider may recommend certain vaccines, such as:  Influenza vaccine. This is recommended every year.  Tetanus, diphtheria, and acellular pertussis (Tdap, Td) vaccine. You may need a Td booster every 10 years.  Zoster vaccine. You may need this after age  72.  Pneumococcal 13-valent conjugate (PCV13) vaccine. One dose is recommended after age 72.  Pneumococcal polysaccharide (PPSV23) vaccine. One dose is recommended after age 72. Talk to your health care provider about which screenings and vaccines you need and how often you need them. This information is not intended to replace advice given to you by your health care provider. Make sure you discuss any questions you have with your health care provider. Document Released: 10/15/2015 Document Revised: 06/07/2016 Document Reviewed: 07/20/2015 Elsevier Interactive Patient Education  2017 [place] Prevention in the Home Falls can cause injuries. They can happen to people of all ages. There are many things you can do to make your home safe and to help prevent falls. What can I do on the outside of my home?  Regularly fix the edges of walkways and driveways and fix any cracks.  Remove anything that might make you trip as you walk through a door, such as a raised step or threshold.  Trim any bushes or trees on the path to your home.  Use bright outdoor lighting.  Clear any walking paths of anything that might make someone trip, such as rocks or tools.  Regularly check to see if handrails are loose or broken. Make sure that both sides of any steps have handrails.  Any raised decks and porches should have guardrails on the edges.  Have any leaves, snow, or ice cleared regularly.  Use sand or salt on walking paths during winter.  Clean up any spills in your garage right away. This includes oil or grease spills. What can I do in the bathroom?  Use night lights.  Install grab bars by the toilet and in the tub and shower. Do not use towel bars as grab bars.  Use non-skid mats or decals in the tub or shower.  If you need to sit down in the shower, use a plastic, non-slip stool.  Keep the floor dry. Clean up any water that spills on the floor as soon as it happens.  Remove  soap buildup in the tub or shower regularly.  Attach bath mats securely with double-sided non-slip rug tape.  Do not have throw rugs and other things on the floor that can make you trip. What can I do in the bedroom?  Use night lights.  Make sure that you have a light by your bed that is easy to reach.  Do not use any sheets or blankets that are too big for your bed. They should not hang down onto the floor.  Have a firm chair that has side arms. You can use this for support while you get dressed.  Do not have throw rugs and other things on the floor that can make you trip. What can I do in the kitchen?  Clean up any spills right away.  Avoid walking on wet floors.  Keep items that you use a lot in easy-to-reach places.  If you need to reach something above you, use a strong step stool that has a grab bar.  Keep electrical cords out of the way.  Do not use floor polish or wax that makes floors slippery. If you must use wax, use non-skid floor wax.  Do not have throw  rugs and other things on the floor that can make you trip. What can I do with my stairs?  Do not leave any items on the stairs.  Make sure that there are handrails on both sides of the stairs and use them. Fix handrails that are broken or loose. Make sure that handrails are as long as the stairways.  Check any carpeting to make sure that it is firmly attached to the stairs. Fix any carpet that is loose or worn.  Avoid having throw rugs at the top or bottom of the stairs. If you do have throw rugs, attach them to the floor with carpet tape.  Make sure that you have a light switch at the top of the stairs and the bottom of the stairs. If you do not have them, ask someone to add them for you. What else can I do to help prevent falls?  Wear shoes that:  Do not have high heels.  Have rubber bottoms.  Are comfortable and fit you well.  Are closed at the toe. Do not wear sandals.  If you use a  stepladder:  Make sure that it is fully opened. Do not climb a closed stepladder.  Make sure that both sides of the stepladder are locked into place.  Ask someone to hold it for you, if possible.  Clearly mark and make sure that you can see:  Any grab bars or handrails.  First and last steps.  Where the edge of each step is.  Use tools that help you move around (mobility aids) if they are needed. These include:  Canes.  Walkers.  Scooters.  Crutches.  Turn on the lights when you go into a dark area. Replace any light bulbs as soon as they burn out.  Set up your furniture so you have a clear path. Avoid moving your furniture around.  If any of your floors are uneven, fix them.  If there are any pets around you, be aware of where they are.  Review your medicines with your doctor. Some medicines can make you feel dizzy. This can increase your chance of falling. Ask your doctor what other things that you can do to help prevent falls. This information is not intended to replace advice given to you by your health care provider. Make sure you discuss any questions you have with your health care provider. Document Released: 07/15/2009 Document Revised: 02/24/2016 Document Reviewed: 10/23/2014 Elsevier Interactive Patient Education  2017 [employer].

## 2021-01-19 NOTE — Progress Notes (Signed)
Virtual Visit via Telephone Note  I connected with  [name] on 01/19/21 at  8:00 AM EDT by telephone and verified that I am speaking with the correct person using two identifiers.  Location: Patient: Home Provider: Office Persons participating in the virtual visit: patient/Nurse Health Advisor   I discussed the limitations, risks, security and privacy concerns of performing an evaluation and management service by telephone and the availability of in person appointments. The patient expressed understanding and agreed to proceed.  Interactive audio and video telecommunications were attempted between this nurse and patient, however failed, due to patient having technical difficulties OR patient did not have access to video capability.  We continued and completed visit with audio only.  Some vital signs may be absent or patient reported.   Willette Brace, LPN    Subjective:   [name] is a 70 y.o. female who presents for Medicare Annual (Subsequent) preventive examination.  Review of Systems     Cardiac Risk Factors include: advanced age (>11men, >72 women);obesity (BMI >30kg/m2)     Objective:    Today's Vitals   01/19/21 0802  PainSc: 5    There is no height or weight on file to calculate BMI.  Advanced Directives 01/19/2021 05/20/2018 06/24/2014  Does Patient Have a Medical Advance Directive? Yes Yes Yes  Type of [employer] [place];Living will  Does patient want to make changes to medical advance directive? - - No - Patient declined  Copy of [place] in Chart? No - copy requested - No - copy requested    Current Medications (verified) Outpatient Encounter Medications as of 01/19/2021  Medication Sig  . albuterol (VENTOLIN HFA) 108 (90 Base) MCG/ACT inhaler TAKE 2 PUFFS BY MOUTH EVERY 6 HOURS AS NEEDED  . cycloSPORINE (RESTASIS) 0.05 % ophthalmic emulsion Restasis 0.05 % eye drops  in a dropperette  . estradiol (ESTRACE) 1 MG tablet Take 1 tablet (1 mg total) by mouth daily.  Marland Kitchen FLUoxetine (PROZAC) 10 MG capsule Take 10 mg by mouth daily.  . Fluticasone-Salmeterol (WIXELA INHUB) 250-50 MCG/DOSE AEPB INHALE 1 PUFF BY MOUTH TWICE A DAY  . furosemide (LASIX) 40 MG tablet furosemide 40 mg tablet  TAKE 1 TABLET BY MOUTH DAILY AS NEEDED  . Ginkgo Biloba (GINKOBA PO) Take 1 capsule by mouth daily.  Marland Kitchen loratadine (CLARITIN) 10 MG tablet Take 1 tablet (10 mg total) by mouth daily.  . meloxicam (MOBIC) 15 MG tablet TAKE 1 TABLET BY MOUTH EVERY DAY AS NEEDED FOR PAIN  . Multiple Vitamins-Minerals (MULTIVITAMIN WITH MINERALS) tablet Take 1 tablet by mouth daily.  Marland Kitchen OVER THE COUNTER MEDICATION CBD Oil daily at bedtime  . ZIOPTAN 0.0015 % SOLN Place 1 drop into both eyes at bedtime.  . [DISCONTINUED] predniSONE (DELTASONE) 10 MG tablet Take 5 tabs first thing in morning on day 1, 4 tabs on day 2, 3 tabs on day 3, 2 tabs on day 4, 1 tab on day 5.  . [DISCONTINUED] Vitamin D, Ergocalciferol, (DRISDOL) 1.25 MG (50000 UNIT) CAPS capsule Take 1 capsule (50,000 Units total) by mouth every 7 (seven) days.   No facility-administered encounter medications on file as of 01/19/2021.    Allergies (verified) Ibuprofen and Sulfa antibiotics   History: Past Medical History:  Diagnosis Date  . Acute bronchitis   . Allergy   . Anxiety   . Arthritis    knees  . Asthma   . Backache,  unspecified   . Cataract    removed both eyes  . Depression   . Diverticulitis   . Diverticulosis of colon (without mention of hemorrhage)   . Edema   . Glaucoma   . Lumbago   . Obesity   . Other chest pain   . Pain in joint, ankle and foot   . Sarcoidosis   . Tibialis tendinitis   . Unspecified asthma(493.90)    Past Surgical History:  Procedure Laterality Date  . ABDOMINAL HYSTERECTOMY    . CATARACT EXTRACTION W/PHACO Right 06/24/2014   Procedure: CATARACT EXTRACTION PHACO AND INTRAOCULAR LENS  PLACEMENT (IOC) RIGHT EYE WITH GONIOSYNECHIALYSIS;  Surgeon: Marylynn Pearson, MD;  Location: [facility];  Service: Ophthalmology;  Laterality: Right;  . COLONOSCOPY    . EYE SURGERY Left    cataract surgery  . HAND SURGERY    . IR RADIOLOGIST EVAL & MGMT  11/14/2018  . KNEE ARTHROSCOPY Right    Family History  Problem Relation Age of Onset  . Hypertension Mother   . Stroke Mother   . Diabetes Father   . Sarcoidosis Sister   . Cancer Brother        prostate  . Colon cancer Neg Hx   . Colon polyps Neg Hx   . Esophageal cancer Neg Hx   . Rectal cancer Neg Hx   . Stomach cancer Neg Hx    Social History   Socioeconomic History  . Marital status: Widowed    Spouse name: Not on file  . Number of children: Not on file  . Years of education: Not on file  . Highest education level: Not on file  Occupational History  . Not on file  Tobacco Use  . Smoking status: Never Smoker  . Smokeless tobacco: Never Used  Substance and Sexual Activity  . Alcohol use: Yes    Alcohol/week: 1.0 standard drink    Types: 1 Standard drinks or equivalent per week  . Drug use: No  . Sexual activity: Not on file  Other Topics Concern  . Not on file  Social History Narrative  . Not on file   Social Determinants of Health   Financial Resource Strain: Low Risk   . Difficulty of Paying Living Expenses: Not hard at all  Food Insecurity: No Food Insecurity  . Worried About Charity fundraiser in the Last Year: Never true  . Ran Out of Food in the Last Year: Never true  Transportation Needs: No Transportation Needs  . Lack of Transportation (Medical): No  . Lack of Transportation (Non-Medical): No  Physical Activity: Insufficiently Active  . Days of Exercise per Week: 4 days  . Minutes of Exercise per Session: 30 min  Stress: Stress Concern Present  . Feeling of Stress : To some extent  Social Connections: Moderately Integrated  . Frequency of Communication with Friends and Family: More than three times  a week  . Frequency of Social Gatherings with Friends and Family: More than three times a week  . Attends Religious Services: 1 to 4 times per year  . Active Member of Clubs or Organizations: Yes  . Attends Archivist Meetings: 1 to 4 times per year  . Marital Status: Widowed    Tobacco Counseling Counseling given: Not Answered   Clinical Intake:  Pre-visit preparation completed: Yes  Pain : 0-10 Pain Score: 5  Pain Type: Acute pain (neck and arm) Pain Location: Neck Pain Orientation: Right Pain Descriptors / Indicators: Aching Pain  Onset: 1 to 4 weeks ago     BMI - recorded: 45.11 Nutritional Status: BMI > 30  Obese Nutritional Risks: None Diabetes: No  How often do you need to have someone help you when you read instructions, pamphlets, or other written materials from your doctor or pharmacy?: 1 - Never  Diabetic?No  Interpreter Needed?: No  Information entered by :: Charlott Rakes, LPN   Activities of Daily Living In your present state of health, do you have any difficulty performing the following activities: 01/19/2021  Hearing? Y  Comment right ear hearing aid  Vision? N  Difficulty concentrating or making decisions? N  Walking or climbing stairs? Y  Dressing or bathing? N  Doing errands, shopping? N  Preparing Food and eating ? N  Using the Toilet? N  In the past six months, have you accidently leaked urine? Y  Comment pt just started on aso for bladder per GYN  Do you have problems with loss of bowel control? N  Managing your Medications? N  Managing your Finances? N  Housekeeping or managing your Housekeeping? N  Some recent data might be hidden    Patient Care Team: [name], MD as PCP - General (Family Medicine)  Indicate any recent Medical Services you may have received from other than [facility] providers in the past year (date may be approximate).     Assessment:   This is a routine wellness examination for  [name].  Hearing/Vision screen  Hearing Screening   125Hz  250Hz  500Hz  1000Hz  2000Hz  3000Hz  4000Hz  6000Hz  8000Hz   Right ear:           Left ear:           Comments: Pt stated loss in the right ear and will be getting a hearing aid   Vision Screening Comments: Pt follows up with Dr Venetia Maxon for annual eye exams   Dietary issues and exercise activities discussed: Current Exercise Habits: Home exercise routine, Type of exercise: Other - see comments (stationary bike), Time (Minutes): 30, Frequency (Times/Week): 4, Weekly Exercise (Minutes/Week): 120  Goals    . Patient Stated     None at this time       Depression Screen PHQ 2/9 Scores 01/19/2021 09/01/2020 09/11/2016 06/14/2015  PHQ - 2 Score 1 4 2 1   PHQ- 9 Score - 16 13 -    Fall Risk Fall Risk  01/19/2021 09/11/2016 06/14/2015  Falls in the past year? 0 No No  Number falls in past yr: 0 - -  Injury with Fall? 0 - -  Risk for fall due to : Impaired mobility;Impaired balance/gait;Impaired vision - -  Follow up Falls prevention discussed - -    FALL RISK PREVENTION PERTAINING TO THE HOME:  Any stairs in or around the home? Yes  If so, are there any without handrails? No  Home free of loose throw rugs in walkways, pet beds, electrical cords, etc? Yes  Adequate lighting in your home to reduce risk of falls? Yes   ASSISTIVE DEVICES UTILIZED TO PREVENT FALLS:  Life alert? No  Use of a cane, walker or w/c? Yes  Grab bars in the bathroom? Yes  Shower chair or bench in shower? Yes  Elevated toilet seat or a handicapped toilet? Yes   TIMED UP AND GO:  Was the test performed? No   Cognitive Function:     6CIT Screen 01/19/2021  What Year? 0 points  What month? 0 points  Count back from 20 0 points  Months in reverse 0 points  Repeat phrase 0 points    Immunizations Immunization History  Administered Date(s) Administered  . Influenza Split 07/17/2011, 06/24/2012  . Influenza Whole 07/11/2007, 07/22/2008, 07/14/2009,  06/29/2010  . Influenza, High Dose Seasonal PF 07/03/2017, 06/05/2018, 07/25/2019  . Influenza,inj,Quad PF,6+ Mos 06/19/2013, 06/12/2014, 06/14/2015, 07/25/2016  . Influenza-Unspecified 07/01/2015, 07/02/2016, 05/31/2020  . PFIZER Comirnaty(Gray Top)Covid-19 Tri-Sucrose Vaccine 12/31/2020  . PFIZER(Purple Top)SARS-COV-2 Vaccination 10/21/2019, 11/10/2019, 07/03/2020  . Pneumococcal Conjugate-13 09/11/2016  . Pneumococcal Polysaccharide-23 05/31/2020  . Tdap 09/11/2016  . Zoster 06/23/2015  . Zoster Recombinat (Shingrix) 07/25/2019, 04/12/2020    TDAP status: Up to date  Flu Vaccine status: Up to date  Pneumococcal vaccine status: Up to date  Covid-19 vaccine status: Completed vaccines  Qualifies for Shingles Vaccine? Yes   Zostavax completed Yes   Shingrix Completed?: Yes  Screening Tests Health Maintenance  Topic Date Due  . INFLUENZA VACCINE  05/02/2021  . MAMMOGRAM  01/06/2023  . TETANUS/TDAP  09/11/2026  . COLONOSCOPY (Pts 45-19yrs Insurance coverage will need to be confirmed)  10/02/2029  . DEXA SCAN  Completed  . COVID-19 Vaccine  Completed  . Hepatitis C Screening  Completed  . PNA vac Low Risk Adult  Completed  . HPV VACCINES  Aged Out    Health Maintenance  There are no preventive care reminders to display for this patient.  Colorectal cancer screening: Type of screening: Colonoscopy. Completed 10/03/19. Repeat every 10 years  Mammogram status: Completed 01/12/21. Repeat every year  Bone Density status: Completed 03/22/12. Results reflect: Bone density results: NORMAL. Repeat every 0 years.  Additional Screening:  Hepatitis C Screening:  Completed 02/06/18  Vision Screening: Recommended annual ophthalmology exams for early detection of glaucoma and other disorders of the eye. Is the patient up to date with their annual eye exam?  Yes  Who is the provider or what is the name of the office in which the patient attends annual eye exams? Dr Venetia Maxon  If pt is  not established with a provider, would they like to be referred to a provider to establish care? No .   Dental Screening: Recommended annual dental exams for proper oral hygiene  Community Resource Referral / Chronic Care Management: CRR required this visit?  No   CCM required this visit?  No      Plan:     I have personally reviewed and noted the following in the patient's chart:   . Medical and social history . Use of alcohol, tobacco or illicit drugs  . Current medications and supplements . Functional ability and status . Nutritional status . Physical activity . Advanced directives . List of other physicians . Hospitalizations, surgeries, and ER visits in previous 12 months . Vitals . Screenings to include cognitive, depression, and falls . Referrals and appointments  In addition, I have reviewed and discussed with patient certain preventive protocols, quality metrics, and best practice recommendations. A written personalized care plan for preventive services as well as general preventive health recommendations were provided to patient.     Willette Brace, LPN   [id number]   Nurse Notes: Pt has many concerns and would like to speak to you concerning her last Covid booster she has been experiencing pain which started in the left arm and now continues to ache in her neck and right side arm. Pt also request that she get the results of her brain scan done 01/08/21 by Dr Lenny Pastel. Please advise

## 2021-01-26 ENCOUNTER — Ambulatory Visit: Payer: Medicare PPO | Admitting: Family Medicine

## 2021-01-26 ENCOUNTER — Other Ambulatory Visit: Payer: Self-pay

## 2021-01-26 ENCOUNTER — Ambulatory Visit (INDEPENDENT_AMBULATORY_CARE_PROVIDER_SITE_OTHER): Payer: Medicare PPO

## 2021-01-26 ENCOUNTER — Encounter: Payer: Self-pay | Admitting: Family Medicine

## 2021-01-26 VITALS — BP 130/100 | HR 104 | Temp 98.1°F | Wt 235.0 lb

## 2021-01-26 DIAGNOSIS — M25531 Pain in right wrist: Secondary | ICD-10-CM

## 2021-01-26 DIAGNOSIS — M109 Gout, unspecified: Secondary | ICD-10-CM

## 2021-01-26 DIAGNOSIS — R200 Anesthesia of skin: Secondary | ICD-10-CM | POA: Diagnosis not present

## 2021-01-26 DIAGNOSIS — M25532 Pain in left wrist: Secondary | ICD-10-CM

## 2021-01-26 DIAGNOSIS — R030 Elevated blood-pressure reading, without diagnosis of hypertension: Secondary | ICD-10-CM

## 2021-01-26 DIAGNOSIS — R600 Localized edema: Secondary | ICD-10-CM

## 2021-01-26 MED ORDER — METHYLPREDNISOLONE ACETATE 40 MG/ML IJ SUSP
40.0000 mg | Freq: Once | INTRAMUSCULAR | Status: AC
  Administered 2021-01-26: 40 mg via INTRAMUSCULAR

## 2021-01-26 MED ORDER — KETOROLAC TROMETHAMINE 60 MG/2ML IM SOLN
30.0000 mg | Freq: Once | INTRAMUSCULAR | Status: AC
  Administered 2021-01-26: 30 mg via INTRAMUSCULAR

## 2021-01-26 MED ORDER — PREDNISONE 10 MG PO TABS: ORAL_TABLET | ORAL | 0 refills | Status: DC

## 2021-01-26 NOTE — Progress Notes (Signed)
Subjective:    Patient ID: [name], female    DOB: [birth year], 70 y.o.   MRN: [id number]  Chief Complaint  Patient presents with  . Hand Pain    HPI Patient was seen today for acute concern.  Endorses right wrist/hand pain since Saturday (5 days ago).  Pain started in L wrist then moved to R hand.  Pain is a 12/10, R wrist>L wrist pain, with decreased strength b/l.  Pain after typing.  Pt is R handed.  No recent changes in foods or supplements.  Had livers 2 wks ago and pizza near start of symptoms.  Also did plumbing work at her house.  Tried ice.  Pt has work she needs to get done today for court tomorrow.    Past Medical History:  Diagnosis Date  . Acute bronchitis   . Allergy   . Anxiety   . Arthritis    knees  . Asthma   . Backache, unspecified   . Cataract    removed both eyes  . Depression   . Diverticulitis   . Diverticulosis of colon (without mention of hemorrhage)   . Edema   . Glaucoma   . Lumbago   . Obesity   . Other chest pain   . Pain in joint, ankle and foot   . Sarcoidosis   . Tibialis tendinitis   . Unspecified asthma(493.90)     Allergies  Allergen Reactions  . Ibuprofen     rash  . Sulfa Antibiotics Rash   Family History  Problem Relation Age of Onset  . Hypertension Mother   . Stroke Mother   . Diabetes Father   . Sarcoidosis Sister   . Colon cancer Neg Hx   . Colon polyps Neg Hx   . Esophageal cancer Neg Hx   . Rectal cancer Neg Hx   . Stomach cancer Neg Hx      ROS General: Denies fever, chills, night sweats, changes in weight, changes in appetite HEENT: Denies headaches, ear pain, changes in vision, rhinorrhea, sore throat CV: Denies CP, palpitations, SOB, orthopnea Pulm: Denies SOB, cough, wheezing GI: Denies abdominal pain, nausea, vomiting, diarrhea, constipation GU: Denies dysuria, hematuria, frequency, vaginal discharge Msk: Denies muscle cramps, joint pains   +wrist and hand pain. Neuro: Denies numbness, tingling   +weakness, numbness in wrist/hands Skin: Denies rashes, bruising   Psych: Denies depression, anxiety, hallucinations    Objective:    Blood pressure (!) 130/100, pulse (!) 104, temperature 98.1 F (36.7 C), temperature source Oral, weight 235 lb (106.6 kg), SpO2 94 %.  Gen. Pleasant, well-nourished, in no distress, normal affect.  Sitting in chair resting both hands on a pillow in her lap. HEENT: [place]/AT, face symmetric, conjunctiva clear, no scleral icterus, PERRLA, EOMI, nares patent without drainage Lungs: no accessory muscle use, CTAB, no wheezes or rales Cardiovascular: Tachycardia, no m/r/g, no peripheral edema Musculoskeletal: TTP R wrist, edema, erythema, and increased warmth.  Pain with flexion and extension.  Negative Tinnel's of L wrist. No paresthesias with tapping on medial and lateral epicondyles b/l.   L wrist pain with flexion of wrist.  No deformities, no cyanosis or clubbing, normal tone Neuro:  A&Ox3, CN II-XII intact, normal gait Skin:  Warm, no lesions/ rash   Wt Readings from Last 3 Encounters:  01/26/21 235 lb (106.6 kg)  11/26/20 231 lb (104.8 kg)  09/01/20 231 lb 6.4 oz (105 kg)    Lab Results  Component Value Date   WBC 5.7  09/01/2020   HGB 12.5 09/01/2020   HCT 39.0 09/01/2020   PLT 324 09/01/2020   GLUCOSE 94 09/01/2020   CHOL 188 09/01/2020   TRIG 54 09/01/2020   HDL 64 09/01/2020   LDLCALC 110 (H) 09/01/2020   ALT 12 09/01/2020   AST 16 09/01/2020   NA 141 09/01/2020   K 4.3 09/01/2020   CL 107 09/01/2020   CREATININE 0.58 (L) 09/01/2020   BUN 11 09/01/2020   CO2 28 09/01/2020   TSH 1.14 09/01/2020   HGBA1C 5.6 09/01/2020    Assessment/Plan:  Bilateral wrist pain  -discussed likely causes including carpal tunnel, arthritis, gout -will obtain labs and imaging -toradol im for pain and depo medrol in clinic -consider EMG/NCS based on results. -continue supportive care NSAIDs, ice, rest, wrist splint(s) - Plan: CBC with  Differential/Platelet, Basic metabolic panel, Uric Acid, Calcium, ionized, DG Wrist Complete Right, ketorolac (TORADOL) injection 30 mg, methylPREDNISolone acetate (DEPO-MEDROL) injection 40 mg, predniSONE (DELTASONE) 10 MG tablet  Localized edema  - Plan: predniSONE (DELTASONE) 10 MG tablet  Numbness  - Plan: CBC with Differential/Platelet, Vitamin B12, Folate  Elevated blood pressure reading without diagnosis of hypertension -likely 2/2 pain as previously controlled -recheck bp -lifestyle modifications and pain control -will continue to monitor.  For continued elevation will start medication  F/u prn in the next few wks for continued symptoms  Grier Mitts, MD

## 2021-01-26 NOTE — Patient Instructions (Signed)
Wrist Pain, Adult There are many things that can cause wrist pain. Some common causes include:  An injury to the wrist area, such as a sprain, strain, or fracture.  Overuse of the joint.  A condition that causes increased pressure on a nerve in the wrist (carpal tunnel syndrome).  Wear and tear of the joints that occurs with aging (osteoarthritis).  Other types of joint inflammation and stiffness (arthritis). Sometimes, the cause of wrist pain is not known. Often, the pain goes away when you follow instructions from your health care provider for relieving pain at home, such as resting the wrist, icing the wrist, or using a splint or an elastic wrap for a short time. If your wrist pain continues, it is important to tell your health care provider. Follow these instructions at home: If you have a splint or elastic wrap:  Wear the splint or wrap as told by your health care provider. Remove it only as told by your health care provider. Ask your health care provider if you may remove it for bathing.  Loosen the splint or wrap if your fingers tingle, become numb, or turn cold and blue.  Check the skin around the splint or wrap every day. Tell your health care provider about any concerns.  Keep the splint or wrap clean.  If the splint or wrap is not waterproof: ? Do not let it get wet. ? Cover it with a watertight covering when you take a bath or shower. Managing pain, stiffness, and swelling  If directed, put ice on the painful area. To do this: ? If you have a removable splint or wrap, remove it as told by your health care provider. ? Put ice in a plastic bag. ? Place a towel between your skin and the bag or between your splint or wrap and the bag. ? Leave the ice on for 20 minutes, 2-3 times a day.  Move your fingers often to reduce stiffness and swelling.  Raise (elevate) the injured area above the level of your heart while you are sitting or lying down.   Activity  Rest your  affected wrist as told by your health care provider.  Return to your normal activities as told by your health care provider. Ask your health care provider what activities are safe for you.  Ask your health care provider when it is safe to drive if you have a splint or wrap on your wrist.  Do exercises as told by your health care provider. General instructions  Pay attention to any changes in your symptoms.  Take over-the-counter and prescription medicines only as told by your health care provider.  Keep all follow-up visits as told by your health care provider. This is important. Contact a health care provider if:  You have a sudden, sharp pain in the wrist, hand, or arm that is different or new.  The swelling or bruising on your wrist or hand gets worse.  Your skin becomes red, gets a rash, or has open sores.  Your pain does not get better or it gets worse.  You have a fever or chills. Get help right away if:  You lose feeling in your fingers or hand.  Your fingers turn white, very red, or cold and blue.  You cannot move your fingers. Summary  Wrist pain in an adult has many different causes.  If your wrist pain continues, it is important to tell your health care provider.  You may need to wear a splint  or an elastic wrap for a short period of time.  Return to your normal activities as told by your health care provider. Ask your health care provider what activities are safe for you. This information is not intended to replace advice given to you by your health care provider. Make sure you discuss any questions you have with your health care provider. Document Revised: 08/07/2019 Document Reviewed: 08/07/2019 Elsevier Patient Education  2021 [place].  Gout  Gout is a condition that causes painful swelling of the joints. Gout is a type of inflammation of the joints (arthritis). This condition is caused by having too much uric acid in the body. Uric acid is a  chemical that forms when the body breaks down substances called purines. Purines are important for building body proteins. When the body has too much uric acid, sharp crystals can form and build up inside the joints. This causes pain and swelling. Gout attacks can happen quickly and may be very painful (acute gout). Over time, the attacks can affect more joints and become more frequent (chronic gout). Gout can also cause uric acid to build up under the skin and inside the kidneys. What are the causes? This condition is caused by too much uric acid in your blood. This can happen because:  Your kidneys do not remove enough uric acid from your blood. This is the most common cause.  Your body makes too much uric acid. This can happen with some cancers and cancer treatments. It can also occur if your body is breaking down too many red blood cells (hemolytic anemia).  You eat too many foods that are high in purines. These foods include organ meats and some seafood. Alcohol, especially beer, is also high in purines. A gout attack may be triggered by trauma or stress. What increases the risk? You are more likely to develop this condition if you:  Have a family history of gout.  Are female and middle-aged.  Are female and have gone through menopause.  Are obese.  Frequently drink alcohol, especially beer.  Are dehydrated.  Lose weight too quickly.  Have an organ transplant.  Have lead poisoning.  Take certain medicines, including aspirin, cyclosporine, diuretics, levodopa, and niacin.  Have kidney disease.  Have a skin condition called psoriasis. What are the signs or symptoms? An attack of acute gout happens quickly. It usually occurs in just one joint. The most common place is the big toe. Attacks often start at night. Other joints that may be affected include joints of the feet, ankle, knee, fingers, wrist, or elbow. Symptoms of this condition may include:  Severe  pain.  Warmth.  Swelling.  Stiffness.  Tenderness. The affected joint may be very painful to touch.  Shiny, red, or purple skin.  Chills and fever. Chronic gout may cause symptoms more frequently. More joints may be involved. You may also have white or yellow lumps (tophi) on your hands or feet or in other areas near your joints.   How is this diagnosed? This condition is diagnosed based on your symptoms, medical history, and physical exam. You may have tests, such as:  Blood tests to measure uric acid levels.  Removal of joint fluid with a thin needle (aspiration) to look for uric acid crystals.  X-rays to look for joint damage. How is this treated? Treatment for this condition has two phases: treating an acute attack and preventing future attacks. Acute gout treatment may include medicines to reduce pain and swelling, including:  NSAIDs.  Steroids. These are strong anti-inflammatory medicines that can be taken by mouth (orally) or injected into a joint.  Colchicine. This medicine relieves pain and swelling when it is taken soon after an attack. It can be given by mouth or through an IV. Preventive treatment may include:  Daily use of smaller doses of NSAIDs or colchicine.  Use of a medicine that reduces uric acid levels in your blood.  Changes to your diet. You may need to see a dietitian about what to eat and drink to prevent gout. Follow these instructions at home: During a gout attack  If directed, put ice on the affected area: ? Put ice in a plastic bag. ? Place a towel between your skin and the bag. ? Leave the ice on for 20 minutes, 2-3 times a day.  Raise (elevate) the affected joint above the level of your heart as often as possible.  Rest the joint as much as possible. If the affected joint is in your leg, you may be given crutches to use.  Follow instructions from your health care provider about eating or drinking restrictions.   Avoiding future gout  attacks  Follow a low-purine diet as told by your dietitian or health care provider. Avoid foods and drinks that are high in purines, including liver, kidney, anchovies, asparagus, herring, mushrooms, mussels, and beer.  Maintain a healthy weight or lose weight if you are overweight. If you want to lose weight, talk with your health care provider. It is important that you do not lose weight too quickly.  Start or maintain an exercise program as told by your health care provider. Eating and drinking  Drink enough fluids to keep your urine pale yellow.  If you drink alcohol: ? Limit how much you use to:  0-1 drink a day for women.  0-2 drinks a day for men. ? Be aware of how much alcohol is in your drink. In the [place] one drink equals one 12 oz bottle of beer (355 mL) one 5 oz glass of wine (148 mL), or one 1 oz glass of hard liquor (44 mL). General instructions  Take over-the-counter and prescription medicines only as told by your health care provider.  Do not drive or use heavy machinery while taking prescription pain medicine.  Return to your normal activities as told by your health care provider. Ask your health care provider what activities are safe for you.  Keep all follow-up visits as told by your health care provider. This is important. Contact a health care provider if you have:  Another gout attack.  Continuing symptoms of a gout attack after 10 days of treatment.  Side effects from your medicines.  Chills or a fever.  Burning pain when you urinate.  Pain in your lower back or belly. Get help right away if you:  Have severe or uncontrolled pain.  Cannot urinate. Summary  Gout is painful swelling of the joints caused by inflammation.  The most common site of pain is the big toe, but it can affect other joints in the body.  Medicines and dietary changes can help to prevent and treat gout attacks. This information is not intended to replace advice given to  you by your health care provider. Make sure you discuss any questions you have with your health care provider. Document Revised: 04/10/2018 Document Reviewed: 04/10/2018 Elsevier Patient Education  2021 [place] A low-purine eating plan involves making food choices to limit your intake  of purine. Purine is a kind of uric acid. Too much uric acid in your blood can cause certain conditions, such as gout and kidney stones. Eating a low-purine diet can help control these conditions. What are tips for following this plan? Reading food labels  Avoid foods with saturated or Trans fat.  Check the ingredient list of grains-based foods, such as bread and cereal, to make sure that they contain whole grains.  Check the ingredient list of sauces or soups to make sure they do not contain meat or fish.  When choosing soft drinks, check the ingredient list to make sure they do not contain high-fructose corn syrup. Shopping  Buy plenty of fresh fruits and vegetables.  Avoid buying canned or fresh fish.  Buy dairy products labeled as low-fat or nonfat.  Avoid buying premade or processed foods. These foods are often high in fat, salt (sodium), and added sugar.   Cooking  Use olive oil instead of butter when cooking. Oils like olive oil, canola oil, and sunflower oil contain healthy fats. Meal planning  Learn which foods do or do not affect you. If you find out that a food tends to cause your gout symptoms to flare up, avoid eating that food. You can enjoy foods that do not cause problems. If you have any questions about a food item, talk with your dietitian or health care provider.  Limit foods high in fat, especially saturated fat. Fat makes it harder for your body to get rid of uric acid.  Choose foods that are lower in fat and are lean sources of protein. General guidelines  Limit alcohol intake to no more than 1 drink a day for nonpregnant women and 2 drinks a day  for men. One drink equals 12 oz of beer, 5 oz of wine, or 1 oz of hard liquor. Alcohol can affect the way your body gets rid of uric acid.  Drink plenty of water to keep your urine clear or pale yellow. Fluids can help remove uric acid from your body.  If directed by your health care provider, take a vitamin C supplement.  Work with your health care provider and dietitian to develop a plan to achieve or maintain a healthy weight. Losing weight can help reduce uric acid in your blood. What foods are recommended? The items listed may not be a complete list. Talk with your dietitian about what dietary choices are best for you. Foods low in purines Foods low in purines do not need to be limited. These include:  All fruits.  All low-purine vegetables, pickles, and olives.  Breads, pasta, rice, cornbread, and popcorn. Cake and other baked goods.  All dairy foods.  Eggs, nuts, and nut butters.  Spices and condiments, such as salt, herbs, and vinegar.  Plant oils, butter, and margarine.  Water, sugar-free soft drinks, tea, coffee, and cocoa.  Vegetable-based soups, broths, sauces, and gravies. Foods moderate in purines Foods moderate in purines should be limited to the amounts listed.   cup of asparagus, cauliflower, spinach, mushrooms, or green peas, each day.  2/3 cup uncooked oatmeal, each day.   cup dry wheat bran or wheat germ, each day.  2-3 ounces of meat or poultry, each day.  4-6 ounces of shellfish, such as crab, lobster, oysters, or shrimp, each day.  1 cup cooked beans, peas, or lentils, each day.  Soup, broths, or bouillon made from meat or fish. Limit these foods as much as possible. What foods are not recommended? The  items listed may not be a complete list. Talk with your dietitian about what dietary choices are best for you. Limit your intake of foods high in purines, including:  Beer and other alcohol.  Meat-based gravy or sauce.  Canned or fresh  fish, such as: ? Anchovies, sardines, herring, and tuna. ? Mussels and scallops. ? Codfish, trout, and haddock.  [place] Salines.  Organ meats, such as: ? Liver or kidney. ? Tripe. ? Sweetbreads (thymus gland or pancreas).  [employer] biochemist.  Yeast or yeast extract supplements.  Drinks sweetened with high-fructose corn syrup. Summary  Eating a low-purine diet can help control conditions caused by too much uric acid in the body, such as gout or kidney stones.  Choose low-purine foods, limit alcohol, and limit foods high in fat.  You will learn over time which foods do or do not affect you. If you find out that a food tends to cause your gout symptoms to flare up, avoid eating that food. This information is not intended to replace advice given to you by your health care provider. Make sure you discuss any questions you have with your health care provider. Document Revised: 01/01/2020 Document Reviewed: 01/01/2020 Elsevier Patient Education  2021 [employer].

## 2021-01-27 LAB — CBC WITH DIFFERENTIAL/PLATELET
Basophils Absolute: 0.1 10*3/uL (ref 0.0–0.1)
Basophils Relative: 1 % (ref 0.0–3.0)
Eosinophils Absolute: 0.4 10*3/uL (ref 0.0–0.7)
Eosinophils Relative: 5.7 % — ABNORMAL HIGH (ref 0.0–5.0)
HCT: 35.4 % — ABNORMAL LOW (ref 36.0–46.0)
Hemoglobin: 11.5 g/dL — ABNORMAL LOW (ref 12.0–15.0)
Lymphocytes Relative: 24.7 % (ref 12.0–46.0)
Lymphs Abs: 1.7 10*3/uL (ref 0.7–4.0)
MCHC: 32.5 g/dL (ref 30.0–36.0)
MCV: 93 fl (ref 78.0–100.0)
Monocytes Absolute: 0.7 10*3/uL (ref 0.1–1.0)
Monocytes Relative: 10.8 % (ref 3.0–12.0)
Neutro Abs: 3.9 10*3/uL (ref 1.4–7.7)
Neutrophils Relative %: 57.8 % (ref 43.0–77.0)
Platelets: 396 10*3/uL (ref 150.0–400.0)
RBC: 3.81 Mil/uL — ABNORMAL LOW (ref 3.87–5.11)
RDW: 14.4 % (ref 11.5–15.5)
WBC: 6.8 10*3/uL (ref 4.0–10.5)

## 2021-01-27 LAB — VITAMIN B12: Vitamin B-12: 401 pg/mL (ref 211–911)

## 2021-01-27 LAB — BASIC METABOLIC PANEL
BUN: 9 mg/dL (ref 6–23)
CO2: 27 mEq/L (ref 19–32)
Calcium: 9.1 mg/dL (ref 8.4–10.5)
Chloride: 101 mEq/L (ref 96–112)
Creatinine, Ser: 0.6 mg/dL (ref 0.40–1.20)
GFR: 90.1 mL/min (ref >60.00)
Glucose, Bld: 84 mg/dL (ref 70–99)
Potassium: 4.1 mEq/L (ref 3.5–5.1)
Sodium: 137 mEq/L (ref 135–145)

## 2021-01-27 LAB — URIC ACID: Uric Acid, Serum: 3.9 mg/dL (ref 2.4–7.0)

## 2021-01-27 LAB — FOLATE: Folate: 12.9 ng/mL (ref >5.9)

## 2021-01-28 ENCOUNTER — Encounter: Payer: Self-pay | Admitting: Family Medicine

## 2021-01-28 DIAGNOSIS — M19031 Primary osteoarthritis, right wrist: Secondary | ICD-10-CM | POA: Insufficient documentation

## 2021-01-28 DIAGNOSIS — M810 Age-related osteoporosis without current pathological fracture: Secondary | ICD-10-CM | POA: Insufficient documentation

## 2021-01-29 LAB — CALCIUM, IONIZED: Calcium, Ion: 5.09 mg/dL (ref 4.8–5.6)

## 2021-02-01 ENCOUNTER — Encounter: Payer: Self-pay | Admitting: Family Medicine

## 2021-02-02 NOTE — Progress Notes (Signed)
Patient viewed on MyChart. 

## 2021-02-02 NOTE — Progress Notes (Signed)
Patient viewed results on MyChart. 

## 2021-02-11 ENCOUNTER — Telehealth: Payer: Medicare PPO | Admitting: Family Medicine

## 2021-02-11 ENCOUNTER — Encounter: Payer: Self-pay | Admitting: Family Medicine

## 2021-02-11 DIAGNOSIS — M810 Age-related osteoporosis without current pathological fracture: Secondary | ICD-10-CM | POA: Diagnosis not present

## 2021-02-11 DIAGNOSIS — M19031 Primary osteoarthritis, right wrist: Secondary | ICD-10-CM | POA: Diagnosis not present

## 2021-02-11 DIAGNOSIS — M19032 Primary osteoarthritis, left wrist: Secondary | ICD-10-CM

## 2021-02-11 MED ORDER — TRAMADOL HCL 50 MG PO TABS: 50.0000 mg | ORAL_TABLET | Freq: Three times a day (TID) | ORAL | 0 refills | Status: AC | PRN

## 2021-02-11 MED ORDER — PREDNISONE 10 MG PO TABS: ORAL_TABLET | ORAL | 0 refills | Status: DC

## 2021-02-11 NOTE — Progress Notes (Signed)
Virtual Visit via Telephone Note Call started via video visit however patient unable to hear this provider.  This provider was also unable to hear the patient but could see her.  I connected with [name] on 02/11/21 at  3:30 PM EDT by telephone and verified that I am speaking with the correct person using two identifiers.   I discussed the limitations, risks, security and privacy concerns of performing an evaluation and management service by telephone and the availability of in person appointments. I also discussed with the patient that there may be a patient responsible charge related to this service. The patient expressed understanding and agreed to proceed.  Location patient: home Location provider: work or home office Participants present for the call: patient, provider Patient did not have a visit in the prior 7 days to address this/these issue(s).   History of Present Illness: Pt with continued bilateral wrist pain, edema, numbness/tingling in fingers.  Imaging from 01/26/2021 of right wrist with osteoporosis and OA.  Patient taking OTC vitamin B12, but has not noticed a difference in paresthesias of fingers.  Patient completed prednisone taper.  States Tylenol/ibuprofen not helping. 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     Observations/Objective: Patient sounds cheerful and well on the phone. I do not appreciate any SOB. Speech and thought processing are grossly intact. Patient reported vitals:  Assessment and Plan:  Primary osteoarthritis of both wrists -Discussed treatment of symptoms including rest, ice, NSAIDs, topical analgesics -PT/OT recommended.  Patient wishes to wait at this time -We will place referral to Ortho in regards to other treatment options. -advised against frequent prednisone use. - Plan: predniSONE (DELTASONE) 10  MG tablet, traMADol (ULTRAM) 50 MG tablet, Ambulatory referral to Orthopedic Surgery  Osteoporosis without current pathological fracture, unspecified osteoporosis type -Noted on x-ray of right wrist on 01/26/2021 -Discussed increasing vitamin D and calcium intake daily. -Weightbearing exercises -Bone density scan recommended  Follow Up Instructions:  As needed  [id number] 5-10 [id number] 11-20 [id number] 21-30  I did not refer this patient for an OV in the next 24 hours for this/these issue(s).  I discussed the assessment and treatment plan with the patient. The patient was provided an opportunity to ask questions and all were answered. The patient agreed with the plan and demonstrated an understanding of the instructions.   The patient was advised to call back or seek an in-person evaluation if the symptoms worsen or if the condition fails to improve as anticipated.  I provided 20:48 minutes of non-face-to-face time during this encounter.   [name], MD

## 2021-02-14 ENCOUNTER — Telehealth: Payer: Self-pay | Admitting: Orthopedic Surgery

## 2021-02-14 NOTE — Telephone Encounter (Signed)
Patient request to start the process for Bil knee gel injection (last inj 09/06/20)   Patient's call back # [phone]

## 2021-02-14 NOTE — Telephone Encounter (Signed)
Talked with patient and advised her that she would need a F/U due to not being seen since her last injection.  Has an appointment on 02/23/2021.

## 2021-02-17 ENCOUNTER — Telehealth: Payer: Self-pay

## 2021-02-17 NOTE — Telephone Encounter (Signed)
VOB submitted for Monovisc, bilateral knee. Pending BV. 

## 2021-02-22 ENCOUNTER — Other Ambulatory Visit: Payer: Self-pay | Admitting: Surgical

## 2021-02-23 ENCOUNTER — Ambulatory Visit (INDEPENDENT_AMBULATORY_CARE_PROVIDER_SITE_OTHER): Payer: Medicare PPO

## 2021-02-23 ENCOUNTER — Telehealth: Payer: Self-pay

## 2021-02-23 ENCOUNTER — Ambulatory Visit: Payer: Medicare PPO | Admitting: Orthopedic Surgery

## 2021-02-23 ENCOUNTER — Ambulatory Visit: Payer: Self-pay

## 2021-02-23 ENCOUNTER — Other Ambulatory Visit: Payer: Self-pay

## 2021-02-23 ENCOUNTER — Encounter: Payer: Self-pay | Admitting: Orthopedic Surgery

## 2021-02-23 VITALS — Ht 60.0 in | Wt 229.8 lb

## 2021-02-23 DIAGNOSIS — M170 Bilateral primary osteoarthritis of knee: Secondary | ICD-10-CM

## 2021-02-23 DIAGNOSIS — M1712 Unilateral primary osteoarthritis, left knee: Secondary | ICD-10-CM

## 2021-02-23 DIAGNOSIS — M25532 Pain in left wrist: Secondary | ICD-10-CM

## 2021-02-23 DIAGNOSIS — M1711 Unilateral primary osteoarthritis, right knee: Secondary | ICD-10-CM

## 2021-02-23 NOTE — Telephone Encounter (Signed)
Pt seen today for f/u visit for gel injections-dr. Marlou Sa pt

## 2021-02-24 NOTE — Telephone Encounter (Signed)
Noted  

## 2021-02-25 ENCOUNTER — Telehealth: Payer: Self-pay

## 2021-02-25 NOTE — Telephone Encounter (Signed)
Awaiting for note to be dictated from 02/23/2021 office visit to submit for PA through [facility].

## 2021-02-26 ENCOUNTER — Encounter: Payer: Self-pay | Admitting: Orthopedic Surgery

## 2021-02-26 NOTE — Progress Notes (Signed)
Office Visit Note   Patient: [name]           Date of Birth: December 22, 1950           MRN: [id number] Visit Date: 02/23/2021 Requested by: [name], MD [place],  [place] [address] PCP: [name], MD  Subjective: Chief Complaint  Patient presents with  . Right Knee - Pain, Follow-up  . Left Knee - Pain, Follow-up  . Left Wrist - Pain  . 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  Last knee Monovisc on 09/06/2020 gave her good relief for several months.  She has had recurrent pain despite conservative measures.  [place]  The gel injections did improve her walking and standing endurance for period of time but they have started to wear off since injection 6 months ago.  She also describes new bilateral wrist pain.  Reports this pain numbness and tingling since April 12 worse on the right than the left.  Taking tramadol as needed along with Tylenol and Aleve.  She has numbness and tingling in digits 1 through 4 on the right-hand side.  A lot of volar wrist and forearm pain on the left and right.  Prednisone has not been helpful.  She is been having to do a lot of data entry which is worsening her symptoms.              ROS: All systems reviewed are negative as they relate to the chief complaint within the history of present illness.  Patient denies  fevers or chills.   Assessment & Plan: Visit Diagnoses:  1. Unilateral primary osteoarthritis, left knee   2. Unilateral primary osteoarthritis, right knee   3. Pain in left wrist     Plan: Impression is bilateral knee arthritis with significant varus alignment in the right knee.  She is avoiding knee replacement at this time in a pretty successful manner.  I think the gel injections have been helpful for her in this regard.  Regarding her wrist the radiographs are unremarkable in terms of significant arthritis.  A  little bit of scapholunate angle decrease in that left wrist but no widening of the lunotriquetral or scapholunate space.  I think she may have carpal tunnel in both hands.  Plan nerve conduction with Dr. Ernestina Patches or neurology to evaluate bilateral carpal tunnel syndrome.  Preapproved for bilateral knee Monovisc injections as well.  Follow-up after nerve study as well as to get the injections in her knees. This patient is diagnosed with osteoarthritis of the knee(s).    Radiographs show evidence of joint space narrowing, osteophytes, subchondral sclerosis and/or subchondral cysts.  This patient has knee pain which interferes with functional and activities of daily living.    This patient has experienced inadequate response, adverse effects and/or intolerance with conservative treatments such as acetaminophen, NSAIDS, topical creams, physical therapy or regular exercise, knee bracing and/or weight loss.   This patient has experienced inadequate response or has a contraindication to intra articular steroid injections for at least 3 months.   This patient is not scheduled to have a total knee replacement within 6 months of starting treatment with viscosupplementation. Follow-Up Instructions: No follow-ups on file.   Orders:  Orders Placed This Encounter  Procedures  . XR KNEE 3 VIEW LEFT  . XR KNEE 3 VIEW RIGHT  . XR  Wrist Complete Left  . Ambulatory referral to Physical Medicine Rehab   No orders of the defined types were placed in this encounter.     Procedures: No procedures performed   Clinical Data: No additional findings.  Objective: Vital Signs: Ht 5' (1.524 m)   Wt 229 lb 12.8 oz (104.2 kg)   BMI 44.88 kg/m   Physical Exam:   Constitutional: Patient appears well-developed HEENT:  Head: Normocephalic Eyes:EOM are normal Neck: Normal range of motion Cardiovascular: Normal rate Pulmonary/chest: Effort normal Neurologic: Patient is alert Skin: Skin is warm Psychiatric:  Patient has normal mood and affect    Ortho Exam: Ortho exam demonstrates slight varus alignment right knee compared to left.  Pedal pulses palpable.  Range of motion remains reasonable with a 5 degree flexion contracture bilaterally and flexion past 90 degrees.  Collateral crucial ligaments are stable.  No effusion in the knees today.  No groin pain with internal or external Tatian leg.  Examination of both hands demonstrates slight abductor pollicis brevis atrophy.  Again this is very slight.  Negative Tinel's cubital tunnel bilaterally.  Patient has 5 out of 5 grip EPL FPL interosseous wrist flexion wrist extension strength.  Radial pulse intact bilaterally.  Specialty Comments:  No specialty comments available.  Imaging: No results found.   PMFS History: Patient Active Problem List   Diagnosis Date Noted  . Osteoporosis 01/28/2021  . Osteoarthritis of right wrist 01/28/2021  . 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  . Pain due to onychomycosis of toenails of both feet 09/13/2020  . Chronic pain of right knee 08/24/2018  . Fall in home 07/14/2015  . TIBIALIS TENDINITIS 08/25/2009  . CHEST PAIN, OTHER, PAIN 10/21/2008  . PEDAL EDEMA 07/22/2008  . ANKLE PAIN, BILATERAL 04/01/2008  . LOW BACK PAIN 04/01/2008  . ACUTE BRONCHITIS 09/23/2007  . SARCOIDOSIS 09/02/2007  . Asthma 09/02/2007  . Diverticulosis of colon 07/11/2007  . BACK PAIN, CHRONIC 07/11/2007   Past Medical History:  Diagnosis Date  . Acute bronchitis   . Allergy   . Anxiety   . Arthritis    knees  . Asthma   . Backache, unspecified   . Cataract    removed both eyes  . Depression   . Diverticulitis   . Diverticulosis of colon (without mention of hemorrhage)   . Edema   . Glaucoma   . Lumbago   . Obesity   . Other chest pain   . Pain in joint, ankle and foot   . Sarcoidosis   . Tibialis tendinitis   . Unspecified asthma(493.90)     Family History  Problem Relation Age of Onset  . Hypertension Mother    . Stroke Mother   . Diabetes Father   . Sarcoidosis Sister   . Colon cancer Neg Hx   . Colon polyps Neg Hx   . Esophageal cancer Neg Hx   . Rectal cancer Neg Hx   . Stomach cancer Neg Hx     Past Surgical History:  Procedure Laterality Date  . ABDOMINAL HYSTERECTOMY    . CATARACT EXTRACTION W/PHACO Right 06/24/2014   Procedure: CATARACT EXTRACTION PHACO AND INTRAOCULAR LENS PLACEMENT (IOC) RIGHT EYE WITH GONIOSYNECHIALYSIS;  Surgeon: Marylynn Pearson, MD;  Location: [facility];  Service: Ophthalmology;  Laterality: Right;  . COLONOSCOPY    . EYE SURGERY Left    cataract surgery  . HAND SURGERY    . IR RADIOLOGIST EVAL & MGMT  11/14/2018  . KNEE ARTHROSCOPY Right  Social History   Occupational History  . Not on file  Tobacco Use  . Smoking status: Never Smoker  . Smokeless tobacco: Never Used  Substance and Sexual Activity  . Alcohol use: Yes    Alcohol/week: 1.0 standard drink    Types: 1 Standard drinks or equivalent per week  . Drug use: No  . Sexual activity: Not on file

## 2021-03-01 ENCOUNTER — Telehealth: Payer: Self-pay

## 2021-03-01 NOTE — Telephone Encounter (Signed)
PA submitted online through [facility] Pending PA# [id number]

## 2021-03-02 ENCOUNTER — Telehealth: Payer: Self-pay

## 2021-03-02 NOTE — Telephone Encounter (Signed)
Approved for Monovisc, bilateral knee. [place] Once OOP is met, patient is covered at 100%. Co-pay of $40.00 PA Approval# [id number] Valid 03/08/2021- 06/06/2021

## 2021-03-16 ENCOUNTER — Other Ambulatory Visit: Payer: Self-pay

## 2021-03-16 ENCOUNTER — Telehealth: Payer: Self-pay

## 2021-03-16 ENCOUNTER — Ambulatory Visit (INDEPENDENT_AMBULATORY_CARE_PROVIDER_SITE_OTHER): Payer: Medicare PPO | Admitting: Orthopedic Surgery

## 2021-03-16 DIAGNOSIS — M1712 Unilateral primary osteoarthritis, left knee: Secondary | ICD-10-CM

## 2021-03-16 DIAGNOSIS — M1711 Unilateral primary osteoarthritis, right knee: Secondary | ICD-10-CM

## 2021-03-16 NOTE — Telephone Encounter (Signed)
Patient was seen this morning for bilateral knee pain. She wanted to check status of referral while she was here for EMG/NCV.  I advised her per notes in chart we had tried contacting her x2.  She states no one had LM if called on her home phone because she does not have a VM set up.  She is asking to be called to schedule appt for EMG/NCV on a Monday, Wednesday or Friday [phone] I was going to schedule but looks like next available would be 08/05.  Dr Marlou Sa wanted to know if she could be squeezed in sooner?

## 2021-03-17 ENCOUNTER — Encounter: Payer: Self-pay | Admitting: Orthopedic Surgery

## 2021-03-17 DIAGNOSIS — M170 Bilateral primary osteoarthritis of knee: Secondary | ICD-10-CM

## 2021-03-17 MED ORDER — HYALURONAN 88 MG/4ML IX SOSY
88.0000 mg | PREFILLED_SYRINGE | INTRA_ARTICULAR | Status: AC | PRN
  Administered 2021-03-17: 88 mg via INTRA_ARTICULAR

## 2021-03-17 MED ORDER — LIDOCAINE HCL 1 % IJ SOLN
5.0000 mL | INTRAMUSCULAR | Status: AC | PRN
  Administered 2021-03-17: 5 mL

## 2021-03-17 NOTE — Progress Notes (Signed)
   Procedure Note  Patient: [name]             Date of Birth: [birth year]           MRN: [id number]             Visit Date: 03/16/2021  Procedures: Visit Diagnoses:  1. Unilateral primary osteoarthritis, left knee   2. Unilateral primary osteoarthritis, right knee     Large Joint Inj: R knee on 03/17/2021 4:56 PM Indications: pain, joint swelling and diagnostic evaluation Details: 18 G 1.5 in needle, superolateral approach  Arthrogram: No  Medications: 5 mL lidocaine 1 %; 88 mg Hyaluronan 88 MG/4ML Outcome: tolerated well, no immediate complications Procedure, treatment alternatives, risks and benefits explained, specific risks discussed. Consent was given by the patient. Immediately prior to procedure a time out was called to verify the correct patient, procedure, equipment, support staff and site/side marked as required. Patient was prepped and draped in the usual sterile fashion.    Large Joint Inj: L knee on 03/17/2021 4:56 PM Indications: pain, joint swelling and diagnostic evaluation Details: 18 G 1.5 in needle, superolateral approach  Arthrogram: No  Medications: 5 mL lidocaine 1 %; 88 mg Hyaluronan 88 MG/4ML Outcome: tolerated well, no immediate complications Procedure, treatment alternatives, risks and benefits explained, specific risks discussed. Consent was given by the patient. Immediately prior to procedure a time out was called to verify the correct patient, procedure, equipment, support staff and site/side marked as required. Patient was prepped and draped in the usual sterile fashion.

## 2021-03-31 ENCOUNTER — Ambulatory Visit: Payer: Medicare PPO | Admitting: Physical Medicine and Rehabilitation

## 2021-03-31 ENCOUNTER — Other Ambulatory Visit: Payer: Self-pay | Admitting: Family Medicine

## 2021-03-31 ENCOUNTER — Other Ambulatory Visit: Payer: Self-pay

## 2021-03-31 ENCOUNTER — Telehealth: Payer: Self-pay

## 2021-03-31 ENCOUNTER — Encounter: Payer: Self-pay | Admitting: Physical Medicine and Rehabilitation

## 2021-03-31 ENCOUNTER — Other Ambulatory Visit: Payer: Self-pay | Admitting: Surgical

## 2021-03-31 DIAGNOSIS — E559 Vitamin D deficiency, unspecified: Secondary | ICD-10-CM

## 2021-03-31 DIAGNOSIS — R202 Paresthesia of skin: Secondary | ICD-10-CM | POA: Diagnosis not present

## 2021-03-31 MED ORDER — MELOXICAM 15 MG PO TABS: 15.0000 mg | ORAL_TABLET | Freq: Every day | ORAL | 2 refills | Status: DC

## 2021-03-31 NOTE — Telephone Encounter (Signed)
Patient came in today for nerve study with dr. Ernestina Patches. She stated her pain in her hands in increasing and wanted to know if anything can be called in for her pain. I scheduled her a f/u with luke on 7/11. Please advise. She stated prednisone has helped in the past

## 2021-03-31 NOTE — Procedures (Signed)
EMG & NCV Findings: Evaluation of the right median motor nerve showed prolonged distal onset latency (6.7 ms), reduced amplitude (2.7 mV), and decreased conduction velocity (Elbow-Wrist, 45 m/s).  The left median (across palm) sensory nerve showed no response (Palm) and prolonged distal peak latency (3.8 ms).  The right median (across palm) sensory nerve showed prolonged distal peak latency (Wrist, 7.6 ms), reduced amplitude (7.9 V), and prolonged distal peak latency (Palm, 2.3 ms).  All remaining nerves (as indicated in the following tables) were within normal limits.  Left vs. Right side comparison data for the median motor nerve indicates abnormal L-R latency difference (3.1 ms).    All examined muscles (as indicated in the following table) showed no evidence of electrical instability.    Impression: The above electrodiagnostic study is ABNORMAL and reveals evidence of:  a severe right median nerve entrapment at the wrist (carpal tunnel syndrome) affecting sensory and motor components.   a mild left median nerve entrapment at the wrist (carpal tunnel syndrome) affecting sensory components.  There is no significant electrodiagnostic evidence of any other focal nerve entrapment, brachial plexopathy or cervical radiculopathy.   Recommendations: 1.  Follow-up with referring physician. 2.  Continue current management of symptoms. 3.  Suggest surgical evaluation.  ___________________________ Laurence Spates FAAPMR Board Certified, American Board of Physical Medicine and Rehabilitation    Nerve Conduction Studies Anti Sensory Summary Table   Stim Site NR Peak (ms) Norm Peak (ms) P-T Amp (V) Norm P-T Amp Site1 Site2 Delta-P (ms) Dist (cm) Vel (m/s) Norm Vel (m/s)  Left Median Acr Palm Anti Sensory (2nd Digit)  31.7C  Wrist    *3.8 <3.6 21.3 >10 Wrist Palm  0.0    Palm *NR  <2.0          Right Median Acr Palm Anti Sensory (2nd Digit)  31.8C  Wrist    *7.6 <3.6 *7.9 >10 Wrist Palm 5.3 0.0     Palm    *2.3 <2.0 5.8         Right Radial Anti Sensory (Base 1st Digit)  31.7C  Wrist    2.3 <3.1 25.0  Wrist Base 1st Digit 2.3 0.0    Right Ulnar Anti Sensory (5th Digit)  31.9C  Wrist    3.3 <3.7 24.0 >15.0 Wrist 5th Digit 3.3 14.0 42 >38   Motor Summary Table   Stim Site NR Onset (ms) Norm Onset (ms) O-P Amp (mV) Norm O-P Amp Site1 Site2 Delta-0 (ms) Dist (cm) Vel (m/s) Norm Vel (m/s)  Left Median Motor (Abd Poll Brev)  31.9C  Wrist    3.6 <4.2 5.7 >5 Elbow Wrist 4.1 21.5 52 >50  Elbow    7.7  5.5         Right Median Motor (Abd Poll Brev)  31.6C  Wrist    *6.7 <4.2 *2.7 >5 Elbow Wrist 4.6 20.5 *45 >50  Elbow    11.3  2.4         Right Ulnar Motor (Abd Dig Min)  31.6C  Wrist    3.0 <4.2 8.1 >3 B Elbow Wrist 3.3 18.5 56 >53  B Elbow    6.3  7.8  A Elbow B Elbow 1.0 10.0 100 >53  A Elbow    7.3  6.4          EMG   Side Muscle Nerve Root Ins Act Fibs Psw Amp Dur Poly Recrt Int Fraser Din Comment  Right Abd Poll Brev Median C8-T1 Nml Nml Nml Nml Nml  0 Nml Nml   Right 1stDorInt Ulnar C8-T1 Nml Nml Nml Nml Nml 0 Nml Nml     Nerve Conduction Studies Anti Sensory Left/Right Comparison   Stim Site L Lat (ms) R Lat (ms) L-R Lat (ms) L Amp (V) R Amp (V) L-R Amp (%) Site1 Site2 L Vel (m/s) R Vel (m/s) L-R Vel (m/s)  Median Acr Palm Anti Sensory (2nd Digit)  31.7C  Wrist *3.8 *7.6 3.8 21.3 *7.9 62.9 Wrist Palm     Palm  *2.3   5.8        Radial Anti Sensory (Base 1st Digit)  31.7C  Wrist  2.3   25.0  Wrist Base 1st Digit     Ulnar Anti Sensory (5th Digit)  31.9C  Wrist  3.3   24.0  Wrist 5th Digit  42    Motor Left/Right Comparison   Stim Site L Lat (ms) R Lat (ms) L-R Lat (ms) L Amp (mV) R Amp (mV) L-R Amp (%) Site1 Site2 L Vel (m/s) R Vel (m/s) L-R Vel (m/s)  Median Motor (Abd Poll Brev)  31.9C  Wrist 3.6 *6.7 *3.1 5.7 *2.7 52.6 Elbow Wrist 52 *45 7  Elbow 7.7 11.3 3.6 5.5 2.4 56.4       Ulnar Motor (Abd Dig Min)  31.6C  Wrist  3.0   8.1  B Elbow Wrist  56   B Elbow   6.3   7.8  A Elbow B Elbow  100   A Elbow  7.3   6.4           Waveforms:

## 2021-03-31 NOTE — Telephone Encounter (Signed)
Tried calling. No answer.

## 2021-03-31 NOTE — Telephone Encounter (Signed)
Sent in refill for mobic, let me know if she's tried this and it hasnt worked and we can try something else

## 2021-03-31 NOTE — Progress Notes (Signed)
[name] - 71 y.o. female MRN [id number]  Date of birth: 03-28-1951  Office Visit Note: Visit Date: 03/31/2021 PCP: [name], MD Referred by: [name], MD  Subjective: Chief Complaint  Patient presents with   Right Hand - Numbness   Left Hand - Numbness   HPI:  [name] is a 70 y.o. female who comes in today at the request of Dr. Anderson Malta for electrodiagnostic study of the Bilateral upper extremities.  Patient is Right hand dominant.  She reports several months of worsening severe right much more than left hand pain with numbness and tingling in the radial 3 digits.  She reports prior [facility]diagn[facility].  She does not remember what she was told at that time but feels like maybe she received a cortisone injection.  She did have some issues with trigger finger at that time.  She does endorse nocturnal complaints but now symptoms can be ongoing during the day as well.  No frank radicular symptoms.  She has no history of polyneuropathy.   ROS Otherwise per HPI.  Assessment & Plan: Visit Diagnoses:    ICD-10-CM   1. Paresthesia of skin  R20.2 NCV with EMG (electromyography)      Plan: Impression: The above electrodiagnostic study is ABNORMAL and reveals evidence of:  a severe right median nerve entrapment at the wrist (carpal tunnel syndrome) affecting sensory and motor components.   a mild left median nerve entrapment at the wrist (carpal tunnel syndrome) affecting sensory components.  There is no significant electrodiagnostic evidence of any other focal nerve entrapment, brachial plexopathy or cervical radiculopathy.   Recommendations: 1.  Follow-up with referring physician. 2.  Continue current management of symptoms. 3.  Suggest surgical evaluation.  Meds & Orders: No orders of the defined types were placed in this encounter.   Orders Placed This Encounter  Procedures   NCV with EMG  (electromyography)     Follow-up: No follow-ups on file.   Procedures: No procedures performed  EMG & NCV Findings: Evaluation of the right median motor nerve showed prolonged distal onset latency (6.7 ms), reduced amplitude (2.7 mV), and decreased conduction velocity (Elbow-Wrist, 45 m/s).  The left median (across palm) sensory nerve showed no response (Palm) and prolonged distal peak latency (3.8 ms).  The right median (across palm) sensory nerve showed prolonged distal peak latency (Wrist, 7.6 ms), reduced amplitude (7.9 V), and prolonged distal peak latency (Palm, 2.3 ms).  All remaining nerves (as indicated in the following tables) were within normal limits.  Left vs. Right side comparison data for the median motor nerve indicates abnormal L-R latency difference (3.1 ms).    All examined muscles (as indicated in the following table) showed no evidence of electrical instability.    Impression: The above electrodiagnostic study is ABNORMAL and reveals evidence of:  a severe right median nerve entrapment at the wrist (carpal tunnel syndrome) affecting sensory and motor components.   a mild left median nerve entrapment at the wrist (carpal tunnel syndrome) affecting sensory components.  There is no significant electrodiagnostic evidence of any other focal nerve entrapment, brachial plexopathy or cervical radiculopathy.   Recommendations: 1.  Follow-up with referring physician. 2.  Continue current management of symptoms. 3.  Suggest surgical evaluation.  ___________________________ [name] Board Certified, American Board of Physical Medicine and Rehabilitation    Nerve Conduction Studies Anti Sensory Summary Table   Stim Site NR Peak (  ms) Norm Peak (ms) P-T Amp (V) Norm P-T Amp Site1 Site2 Delta-P (ms) Dist (cm) Vel (m/s) Norm Vel (m/s)  Left Median Acr Palm Anti Sensory (2nd Digit)  31.7C  Wrist    *3.8 <3.6 21.3 >10 Wrist Palm  0.0    Palm *NR  <2.0           Right Median Acr Palm Anti Sensory (2nd Digit)  31.8C  Wrist    *7.6 <3.6 *7.9 >10 Wrist Palm 5.3 0.0    Palm    *2.3 <2.0 5.8         Right Radial Anti Sensory (Base 1st Digit)  31.7C  Wrist    2.3 <3.1 25.0  Wrist Base 1st Digit 2.3 0.0    Right Ulnar Anti Sensory (5th Digit)  31.9C  Wrist    3.3 <3.7 24.0 >15.0 Wrist 5th Digit 3.3 14.0 42 >38   Motor Summary Table   Stim Site NR Onset (ms) Norm Onset (ms) O-P Amp (mV) Norm O-P Amp Site1 Site2 Delta-0 (ms) Dist (cm) Vel (m/s) Norm Vel (m/s)  Left Median Motor (Abd Poll Brev)  31.9C  Wrist    3.6 <4.2 5.7 >5 Elbow Wrist 4.1 21.5 52 >50  Elbow    7.7  5.5         Right Median Motor (Abd Poll Brev)  31.6C  Wrist    *6.7 <4.2 *2.7 >5 Elbow Wrist 4.6 20.5 *45 >50  Elbow    11.3  2.4         Right Ulnar Motor (Abd Dig Min)  31.6C  Wrist    3.0 <4.2 8.1 >3 B Elbow Wrist 3.3 18.5 56 >53  B Elbow    6.3  7.8  A Elbow B Elbow 1.0 10.0 100 >53  A Elbow    7.3  6.4          EMG   Side Muscle Nerve Root Ins Act Fibs Psw Amp Dur Poly Recrt Int Fraser Din Comment  Right Abd Poll Brev Median C8-T1 Nml Nml Nml Nml Nml 0 Nml Nml   Right 1stDorInt Ulnar C8-T1 Nml Nml Nml Nml Nml 0 Nml Nml     Nerve Conduction Studies Anti Sensory Left/Right Comparison   Stim Site L Lat (ms) R Lat (ms) L-R Lat (ms) L Amp (V) R Amp (V) L-R Amp (%) Site1 Site2 L Vel (m/s) R Vel (m/s) L-R Vel (m/s)  Median Acr Palm Anti Sensory (2nd Digit)  31.7C  Wrist *3.8 *7.6 3.8 21.3 *7.9 62.9 Wrist Palm     Palm  *2.3   5.8        Radial Anti Sensory (Base 1st Digit)  31.7C  Wrist  2.3   25.0  Wrist Base 1st Digit     Ulnar Anti Sensory (5th Digit)  31.9C  Wrist  3.3   24.0  Wrist 5th Digit  42    Motor Left/Right Comparison   Stim Site L Lat (ms) R Lat (ms) L-R Lat (ms) L Amp (mV) R Amp (mV) L-R Amp (%) Site1 Site2 L Vel (m/s) R Vel (m/s) L-R Vel (m/s)  Median Motor (Abd Poll Brev)  31.9C  Wrist 3.6 *6.7 *3.1 5.7 *2.7 52.6 Elbow Wrist 52 *45 7  Elbow 7.7 11.3  3.6 5.5 2.4 56.4       Ulnar Motor (Abd Dig Min)  31.6C  Wrist  3.0   8.1  B Elbow Wrist  56   B Elbow  6.3  7.8  A Elbow B Elbow  100   A Elbow  7.3   6.4           Waveforms:                Clinical History: No specialty comments available.     Objective:  VS:  HT:    WT:   BMI:     BP:   HR: bpm  TEMP: ( )  RESP:  Physical Exam Musculoskeletal:        General: No swelling, tenderness or deformity.     Comments: Inspection reveals flattening of the right APB but no atrophy of the bilateral APB or FDI or hand intrinsics. There is no swelling, color changes, allodynia or dystrophic changes. There is 5 out of 5 strength in the bilateral wrist extension, finger abduction and long finger flexion. There is decreased sensation to light touch in the right median nerve distribution. There is a negative Hoffmann's test bilaterally.  Skin:    General: Skin is warm and dry.     Findings: No erythema or rash.  Neurological:     General: No focal deficit present.     Mental Status: She is alert and oriented to person, place, and time.     Motor: No weakness or abnormal muscle tone.     Coordination: Coordination normal.  Psychiatric:        Mood and Affect: Mood normal.        Behavior: Behavior normal.     Imaging: No results found.

## 2021-03-31 NOTE — Progress Notes (Signed)
Having numbness  and tingling in both hands   Numeric Pain Rating Scale and Functional Assessment Average Pain 8   In the last MONTH (on 0-10 scale) has pain interfered with the following?  1. General activity like being  able to carry out your everyday physical activities such as walking, climbing stairs, carrying groceries, or moving a chair?  Rating(8)

## 2021-04-01 NOTE — Telephone Encounter (Signed)
Tried calling again. No answer.

## 2021-04-11 ENCOUNTER — Ambulatory Visit: Payer: Medicare PPO | Admitting: Surgical

## 2021-04-11 ENCOUNTER — Other Ambulatory Visit: Payer: Self-pay | Admitting: Family Medicine

## 2021-04-11 ENCOUNTER — Other Ambulatory Visit: Payer: Self-pay

## 2021-04-11 ENCOUNTER — Encounter: Payer: Self-pay | Admitting: Surgical

## 2021-04-11 DIAGNOSIS — G5603 Carpal tunnel syndrome, bilateral upper limbs: Secondary | ICD-10-CM | POA: Diagnosis not present

## 2021-04-11 MED ORDER — METHYLPREDNISOLONE 4 MG PO TBPK: ORAL_TABLET | ORAL | 0 refills | Status: DC

## 2021-04-11 NOTE — Progress Notes (Signed)
Office Visit Note   Patient: [name]           Date of Birth: 1950-11-07           MRN: [id number] Visit Date: 04/11/2021 Requested by: [name], MD [place],   [id number] PCP: [name], MD  Subjective: Chief Complaint  Patient presents with   Other     EMG/NCV review     HPI: [name] is a 70 y.o. female who presents to the office complaining of bilateral hand pain.  Patient complains of right greater than left hand pain with numbness and tingling throughout the thumb, index, middle, ring fingers of the right hand primarily.  She had nerve conduction study by Dr. Ernestina Patches that revealed severe right median nerve entrapment as well as mild left median nerve entrapment.  No real change in her symptoms since she last saw Dr. Marlou Sa..                ROS: All systems reviewed are negative as they relate to the chief complaint within the history of present illness.  Patient denies fevers or chills.  Assessment & Plan: Visit Diagnoses:  1. Bilateral carpal tunnel syndrome     Plan: Patient is a 70 year old female who presents complaining of bilateral hand pain, primarily on the right.  She has history of severe right carpal tunnel syndrome on nerve conduction study with mild left carpal tunnel syndrome.  Discussed options available patient and discussed the pathophysiology of carpal tunnel syndrome and the natural history of median nerve entrapment.  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  She would like to proceed with surgical intervention in September.  Discussed the risks and benefits of the procedure including risk of nerve or vessel damage, need for revision surgery, infection, wrist stiffness, persistent pain, incomplete resolution of numbness/tingling, pain.  After risks and benefits were discussed as well as the  recovery time frame, patient wishes to proceed in the future.  In the meantime, she will try right wrist splint at night with Medrol Dosepak.  Debbie's card was given so that she can call to schedule surgery when she is ready.  Follow-Up Instructions: No follow-ups on file.   Orders:  No orders of the defined types were placed in this encounter.  Meds ordered this encounter  Medications   methylPREDNISolone (MEDROL DOSEPAK) 4 MG TBPK tablet    Sig: Take dosepak as directed    Dispense:  21 tablet    Refill:  0      Procedures: No procedures performed   Clinical Data: No additional findings.  Objective: Vital Signs: There were no vitals taken for this visit.  Physical Exam:  Constitutional: Patient appears well-developed HEENT:  Head: Normocephalic Eyes:EOM are normal Neck: Normal range of motion Cardiovascular: Normal rate Pulmonary/chest: Effort normal Neurologic: Patient is alert Skin: Skin is warm Psychiatric: Patient has normal mood and affect  Ortho Exam: Ortho exam demonstrates bilateral hands with no muscle wasting noted aside from a little bit of flattening of the abductor pollicis brevis.  Excellent strength of thumb abduction bilaterally.  No dysfunction with bilateral EPL, wrist extension, finger abduction, finger adduction..  No interosseous wasting.  Specialty Comments:  No specialty comments available.  Imaging: No results found.   PMFS History: Patient Active Problem List   Diagnosis  Date Noted   Osteoporosis 01/28/2021   Osteoarthritis of right wrist 01/28/2021   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   Pain due to onychomycosis of toenails of both feet 09/13/2020   Chronic pain of right knee 08/24/2018   Fall in home 07/14/2015   TIBIALIS TENDINITIS 08/25/2009   CHEST PAIN, OTHER, PAIN 10/21/2008   PEDAL EDEMA 07/22/2008   ANKLE PAIN, BILATERAL 04/01/2008   LOW BACK PAIN 04/01/2008   ACUTE BRONCHITIS 09/23/2007   SARCOIDOSIS 09/02/2007    Asthma 09/02/2007   Diverticulosis of colon 07/11/2007   BACK PAIN, CHRONIC 07/11/2007   Past Medical History:  Diagnosis Date   Acute bronchitis    Allergy    Anxiety    Arthritis    knees   Asthma    Backache, unspecified    Cataract    removed both eyes   Depression    Diverticulitis    Diverticulosis of colon (without mention of hemorrhage)    Edema    Glaucoma    Lumbago    Obesity    Other chest pain    Pain in joint, ankle and foot    Sarcoidosis    Tibialis tendinitis    Unspecified asthma(493.90)     Family History  Problem Relation Age of Onset   Hypertension Mother    Stroke Mother    Diabetes Father    Sarcoidosis Sister    Colon cancer Neg Hx    Colon polyps Neg Hx    Esophageal cancer Neg Hx    Rectal cancer Neg Hx    Stomach cancer Neg Hx     Past Surgical History:  Procedure Laterality Date   ABDOMINAL HYSTERECTOMY     CATARACT EXTRACTION W/PHACO Right 06/24/2014   Procedure: CATARACT EXTRACTION PHACO AND INTRAOCULAR LENS PLACEMENT ([place]) RIGHT EYE WITH GONIOSYNECHIALYSIS;  Surgeon: Marylynn Pearson, MD;  Location: [facility];  Service: Ophthalmology;  Laterality: Right;   COLONOSCOPY     EYE SURGERY Left    cataract surgery   HAND SURGERY     IR RADIOLOGIST EVAL & MGMT  11/14/2018   KNEE ARTHROSCOPY Right    Social History   Occupational History   Not on file  Tobacco Use   Smoking status: Never   Smokeless tobacco: Never  Substance and Sexual Activity   Alcohol use: Yes    Alcohol/week: 1.0 standard drink    Types: 1 Standard drinks or equivalent per week   Drug use: No   Sexual activity: Not on file

## 2021-04-14 ENCOUNTER — Ambulatory Visit: Payer: Medicare PPO | Admitting: Podiatry

## 2021-05-13 ENCOUNTER — Other Ambulatory Visit: Payer: Self-pay | Admitting: Surgical

## 2021-05-13 NOTE — Telephone Encounter (Signed)
Dont want to refill, not something to be constantly taking longterm

## 2021-05-16 ENCOUNTER — Telehealth: Payer: Self-pay | Admitting: Orthopedic Surgery

## 2021-05-16 NOTE — Telephone Encounter (Signed)
Pt need refill on on methylipredniolone.   CB [phone]

## 2021-05-16 NOTE — Telephone Encounter (Signed)
Not something want to refill, esp if she is having upcoming surgery in September

## 2021-05-25 ENCOUNTER — Telehealth: Payer: Self-pay | Admitting: Family Medicine

## 2021-05-25 NOTE — Telephone Encounter (Signed)
Spoke with pt, scheduled for in office visit for 8/24.

## 2021-05-25 NOTE — Telephone Encounter (Signed)
Patient called about possible refill on predniSONE (DELTASONE) 10 MG tablet for carpel tunnel. She has seen the [facility] and their recommending surgery, but she does not want surgery. She said the prednisone worked for the inflammation. She would also want some advice on other alternatives that may work. Right foot is also swelling, pharmacy called for refill on furosemide (LASIX) 40 MG tablet.  as well, but pharmacy stated patient needed to call herself.     Good callback number is [phone]  Please Advise

## 2021-05-26 ENCOUNTER — Other Ambulatory Visit: Payer: Self-pay

## 2021-05-26 ENCOUNTER — Other Ambulatory Visit: Payer: Self-pay | Admitting: Family Medicine

## 2021-05-26 ENCOUNTER — Ambulatory Visit: Payer: Medicare PPO | Admitting: Family Medicine

## 2021-05-26 ENCOUNTER — Encounter: Payer: Self-pay | Admitting: Family Medicine

## 2021-05-26 DIAGNOSIS — E538 Deficiency of other specified B group vitamins: Secondary | ICD-10-CM | POA: Diagnosis not present

## 2021-05-26 DIAGNOSIS — E559 Vitamin D deficiency, unspecified: Secondary | ICD-10-CM | POA: Diagnosis not present

## 2021-05-26 DIAGNOSIS — Z23 Encounter for immunization: Secondary | ICD-10-CM | POA: Diagnosis not present

## 2021-05-26 DIAGNOSIS — R600 Localized edema: Secondary | ICD-10-CM

## 2021-05-26 DIAGNOSIS — G5603 Carpal tunnel syndrome, bilateral upper limbs: Secondary | ICD-10-CM

## 2021-05-26 LAB — BASIC METABOLIC PANEL
BUN: 11 mg/dL (ref 6–23)
CO2: 27 mEq/L (ref 19–32)
Calcium: 9.3 mg/dL (ref 8.4–10.5)
Chloride: 104 mEq/L (ref 96–112)
Creatinine, Ser: 0.65 mg/dL (ref 0.40–1.20)
GFR: 88.18 mL/min (ref >60.00)
Glucose, Bld: 103 mg/dL — ABNORMAL HIGH (ref 70–99)
Potassium: 3.8 mEq/L (ref 3.5–5.1)
Sodium: 142 mEq/L (ref 135–145)

## 2021-05-26 LAB — VITAMIN D 25 HYDROXY (VIT D DEFICIENCY, FRACTURES): VITD: 14.22 ng/mL — ABNORMAL LOW (ref 30.00–100.00)

## 2021-05-26 LAB — VITAMIN B12: Vitamin B-12: 429 pg/mL (ref 211–911)

## 2021-05-26 MED ORDER — FUROSEMIDE 40 MG PO TABS: 20.0000 mg | ORAL_TABLET | ORAL | 1 refills | Status: DC | PRN

## 2021-05-26 MED ORDER — VITAMIN D (ERGOCALCIFEROL) 1.25 MG (50000 UNIT) PO CAPS: 50000.0000 [IU] | ORAL_CAPSULE | ORAL | 0 refills | Status: DC

## 2021-05-26 MED ORDER — PREDNISONE 10 MG PO TABS: ORAL_TABLET | ORAL | 0 refills | Status: DC

## 2021-05-26 NOTE — Progress Notes (Signed)
Subjective:    Patient ID: [name], female    DOB: Feb 15, 1951, 70 y.o.   MRN: [id number]  Chief Complaint  Patient presents with   Medication Refill    HPI Patient was seen today for ongoing concerns.  Requesting repeat lab tests vitamin D and B12 were low.  S/p supplementation.  Patient interested in influenza vaccine if available.  Patient endorses continued bilateral wrist pain right greater than left.  Patient is right-handed.  Patient had EMG/NCS which verified carpal tunnel.  Seen by Ortho, surgery recommended.  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  Prednisone helped symptoms in the past.  Had Medrol Dosepak the beginning of July.  Having difficulty picking up coffee pot.  Wearing brace at night.  Tried Tylenol, CBD oil, lavender, and other treatments recommended by friends.  Patient does not want to have surgery.  Patient also endorses bilateral pedal edema.  Endorses frequent sitting during the day.  Eating takeout as does not cook at home.  Had pizza last night and this morning for breakfast.  Endorses difficulty getting shoes on this morning.  Requesting refill on Lasix.  Past Medical History:  Diagnosis Date   Acute bronchitis    Allergy    Anxiety    Arthritis    knees   Asthma    Backache, unspecified    Cataract    removed both eyes   Depression    Diverticulitis    Diverticulosis of colon (without mention of hemorrhage)    Edema    Glaucoma    Lumbago    Obesity    Other chest pain    Pain in joint, ankle and foot    Sarcoidosis    Tibialis tendinitis    Unspecified asthma(493.90)     Allergies  Allergen Reactions   Ibuprofen     rash   Sulfa Antibiotics Rash    ROS General: Denies fever, chills, night sweats, changes in weight, changes in appetite HEENT: Denies headaches, ear pain, changes in vision, rhinorrhea, sore throat CV: Denies CP, palpitations, SOB, orthopnea + pedal edema Pulm: Denies SOB, cough, wheezing GI:  Denies abdominal pain, nausea, vomiting, diarrhea, constipation GU: Denies dysuria, hematuria, frequency, vaginal discharge Msk: Denies muscle cramps, joint pains + bilateral wrist pain Neuro: Denies weakness, numbness, tingling + numbness, tingling Skin: Denies rashes, bruising Psych: Denies depression, anxiety, hallucinations     Objective:    Blood pressure 110/70, pulse (!) 120, temperature 98.2 F (36.8 C), temperature source Oral, weight 222 lb 9.6 oz (101 kg), SpO2 96 %.  Gen. Pleasant, well-nourished, in no distress, normal affect   HEENT: [place]/AT, face symmetric, conjunctiva clear, no scleral icterus, PERRLA, EOMI, nares patent without drainage Lungs: no accessory muscle use Cardiovascular: Tachycardia, trace edema of bilateral ankles, pedal edema  Musculoskeletal: No deformities, no cyanosis or clubbing, normal tone Neuro:  A&Ox3, CN II-XII intact, normal gait Skin:  Warm, no lesions/ rash  Wt Readings from Last 3 Encounters:  02/23/21 229 lb 12.8 oz (104.2 kg)  01/26/21 235 lb (106.6 kg)  11/26/20 231 lb (104.8 kg)    Lab Results  Component Value Date   WBC 6.8 01/26/2021   HGB 11.5 (L) 01/26/2021   HCT 35.4 (L) 01/26/2021   PLT 396.0 01/26/2021   GLUCOSE 84 01/26/2021   CHOL 188 09/01/2020   TRIG 54 09/01/2020   HDL 64 09/01/2020   LDLCALC 110 (H) 09/01/2020   ALT 12 09/01/2020   AST 16 09/01/2020  NA 137 01/26/2021   K 4.1 01/26/2021   CL 101 01/26/2021   CREATININE 0.60 01/26/2021   BUN 9 01/26/2021   CO2 27 01/26/2021   TSH 1.14 09/01/2020   HGBA1C 5.6 09/01/2020    Assessment/Plan:  Bilateral carpal tunnel syndrome -s/p NCS/EMG -Continue supportive care including wrist splint, ergonomic workspace modifications, rest -Advised against frequent prednisone/steroid use -Consider second Ortho opinion as patient does not want surgery. -Discussed PT/OT, acupuncture.  Given information. - Plan: predniSONE (DELTASONE) 10 MG tablet  Vitamin D  deficiency -Vitamin D 25 hydroxy 21 on 09/01/2020  - Plan: Vitamin D, 25-hydroxy  Pedal edema  -Supportive care including decreasing sodium intake, elevating LEs when sitting, compression socks or TED hose. -Lasix as needed - Plan: Basic metabolic panel, furosemide (LASIX) 40 MG tablet  Need for influenza vaccination - Plan: Flu Vaccine QUAD High Dose(Fluad)  Vitamin B12 deficiency - Plan: Vitamin B12, CANCELED: CBC with Differential/Platelet  F/u as needed  Grier Mitts, MD

## 2021-05-26 NOTE — Patient Instructions (Addendum)
[facility] [phone] [email or web address] [address], [place] [address]

## 2021-06-04 ENCOUNTER — Other Ambulatory Visit: Payer: Self-pay | Admitting: Family Medicine

## 2021-06-04 DIAGNOSIS — J4541 Moderate persistent asthma with (acute) exacerbation: Secondary | ICD-10-CM

## 2021-07-05 ENCOUNTER — Telehealth: Payer: Self-pay | Admitting: Orthopedic Surgery

## 2021-07-05 NOTE — Telephone Encounter (Signed)
Noted  

## 2021-07-05 NOTE — Telephone Encounter (Signed)
Patient called needing to get the gel injection again. Patient said if she can not get the gel injection can she get a cortisone injection instead? The number to contact patient is [phone]

## 2021-07-08 NOTE — Telephone Encounter (Signed)
Called and left a VM advising patient to CB concerning gel injection.  Next available gel injection would need to be after 09/15/2021 due to last bilateral knee injection being done on 03/16/2021.  Will submit in [place].

## 2021-07-12 ENCOUNTER — Telehealth: Payer: Self-pay | Admitting: Orthopedic Surgery

## 2021-07-12 NOTE — Telephone Encounter (Signed)
Pt called requesting a refill of meloxicam. Please send to pharmacy on file. Pt phone number is [phone].

## 2021-07-14 ENCOUNTER — Other Ambulatory Visit: Payer: Self-pay

## 2021-07-14 ENCOUNTER — Ambulatory Visit: Payer: Medicare PPO | Admitting: Orthopedic Surgery

## 2021-07-14 DIAGNOSIS — M1712 Unilateral primary osteoarthritis, left knee: Secondary | ICD-10-CM

## 2021-07-14 DIAGNOSIS — M1711 Unilateral primary osteoarthritis, right knee: Secondary | ICD-10-CM | POA: Diagnosis not present

## 2021-07-15 ENCOUNTER — Encounter: Payer: Self-pay | Admitting: Orthopedic Surgery

## 2021-07-15 MED ORDER — BUPIVACAINE HCL 0.25 % IJ SOLN
4.0000 mL | INTRAMUSCULAR | Status: AC | PRN
  Administered 2021-07-14: 4 mL via INTRA_ARTICULAR

## 2021-07-15 MED ORDER — METHYLPREDNISOLONE ACETATE 40 MG/ML IJ SUSP
40.0000 mg | INTRAMUSCULAR | Status: AC | PRN
  Administered 2021-07-14: 40 mg via INTRA_ARTICULAR

## 2021-07-15 MED ORDER — LIDOCAINE HCL 1 % IJ SOLN
5.0000 mL | INTRAMUSCULAR | Status: AC | PRN
  Administered 2021-07-14: 5 mL

## 2021-07-15 NOTE — Progress Notes (Signed)
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  Right knee is hurting worse than the left.  She reports diminished standing and walking endurance.  She has tried multiple conservative measures.  Injections helped her for several months at a time.  No interval history or injury.              ROS: All systems reviewed are negative as they relate to the chief complaint within the history of present illness.  Patient denies  fevers or chills.   Assessment & Plan: Visit Diagnoses:  1. Unilateral primary osteoarthritis, left knee   2. Unilateral primary osteoarthritis, right knee     Plan: Impression is bilateral knee pain with arthritis.  Plan is bilateral knee injection with cortisone.  We will see how that works for her.  Continue to work on nonweightbearing quad strengthening exercises with anti-inflammatories as needed.  Follow-up as needed.  May need knee replacement at sometime in the future.  Follow-Up Instructions: Return if symptoms worsen or fail to improve.   Orders:  No orders of the defined types were placed in this encounter.  No orders of the defined types were placed in this encounter.     Procedures: Large Joint Inj: R knee on 07/14/2021 8:24 AM Indications: diagnostic evaluation, joint swelling and pain Details: 18 G 1.5 in needle, superolateral approach  Arthrogram: No  Medications: 5 mL lidocaine 1 %; 40 mg methylPREDNISolone acetate 40 MG/ML; 4 mL bupivacaine 0.25 % Outcome: tolerated well, no immediate complications Procedure, treatment alternatives, risks and benefits explained, specific risks  discussed. Consent was given by the patient. Immediately prior to procedure a time out was called to verify the correct patient, procedure, equipment, support staff and site/side marked as required. Patient was prepped and draped in the usual sterile fashion.    Large Joint Inj: L knee on 07/14/2021 8:24 AM Indications: diagnostic evaluation, joint swelling and pain Details: 18 G 1.5 in needle, superolateral approach  Arthrogram: No  Medications: 5 mL lidocaine 1 %; 40 mg methylPREDNISolone acetate 40 MG/ML; 4 mL bupivacaine 0.25 % Outcome: tolerated well, no immediate complications Procedure, treatment alternatives, risks and benefits explained, specific risks discussed. Consent was given by the patient. Immediately prior to procedure a time out was called to verify the correct patient, procedure, equipment, support staff and site/side marked as required. Patient was prepped and draped in the usual sterile fashion.      Clinical Data: No additional findings.  Objective: Vital Signs: There were no vitals taken for this visit.  Physical Exam:   Constitutional: Patient appears well-developed HEENT:  Head: Normocephalic Eyes:EOM are normal Neck: Normal range of motion Cardiovascular: Normal rate Pulmonary/chest: Effort normal Neurologic: Patient is alert Skin: Skin is warm Psychiatric: Patient has normal mood and affect   Ortho Exam: Ortho exam demonstrates full active and passive range of motion of the ankles and hips.  She has  Macular tenderness on the right more than the left.  No masses lymphadenopathy or skin changes noted in  the knee region.  She is developing slight flexion contracture in both knees around 5 degrees.  Flexion is still past 90 but getting more difficult.  Specialty Comments:  No specialty comments available.  Imaging: No results found.   PMFS History: Patient Active Problem List   Diagnosis Date Noted   Osteoporosis 01/28/2021   Osteoarthritis  of right wrist 01/28/2021   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   Pain due to onychomycosis of toenails of both feet 09/13/2020   Chronic pain of right knee 08/24/2018   Fall in home 07/14/2015   TIBIALIS TENDINITIS 08/25/2009   CHEST PAIN, OTHER, PAIN 10/21/2008   PEDAL EDEMA 07/22/2008   ANKLE PAIN, BILATERAL 04/01/2008   LOW BACK PAIN 04/01/2008   ACUTE BRONCHITIS 09/23/2007   SARCOIDOSIS 09/02/2007   Asthma 09/02/2007   Diverticulosis of colon 07/11/2007   BACK PAIN, CHRONIC 07/11/2007   Past Medical History:  Diagnosis Date   Acute bronchitis    Allergy    Anxiety    Arthritis    knees   Asthma    Backache, unspecified    Cataract    removed both eyes   Depression    Diverticulitis    Diverticulosis of colon (without mention of hemorrhage)    Edema    Glaucoma    Lumbago    Obesity    Other chest pain    Pain in joint, ankle and foot    Sarcoidosis    Tibialis tendinitis    Unspecified asthma(493.90)     Family History  Problem Relation Age of Onset   Hypertension Mother    Stroke Mother    Diabetes Father    Sarcoidosis Sister    Colon cancer Neg Hx    Colon polyps Neg Hx    Esophageal cancer Neg Hx    Rectal cancer Neg Hx    Stomach cancer Neg Hx     Past Surgical History:  Procedure Laterality Date   ABDOMINAL HYSTERECTOMY     CATARACT EXTRACTION W/PHACO Right 06/24/2014   Procedure: CATARACT EXTRACTION PHACO AND INTRAOCULAR LENS PLACEMENT ([place]) RIGHT EYE WITH GONIOSYNECHIALYSIS;  Surgeon: Marylynn Pearson, MD;  Location: [facility];  Service: Ophthalmology;  Laterality: Right;   COLONOSCOPY     EYE SURGERY Left    cataract surgery   HAND SURGERY     IR RADIOLOGIST EVAL & MGMT  11/14/2018   KNEE ARTHROSCOPY Right    Social History   Occupational History   Not on file  Tobacco Use   Smoking status: Never   Smokeless tobacco: Never  Substance and Sexual Activity   Alcohol use: Yes    Alcohol/week: 1.0 standard drink    Types: 1 Standard drinks or  equivalent per week   Drug use: No   Sexual activity: Not on file

## 2021-07-17 ENCOUNTER — Other Ambulatory Visit: Payer: Self-pay | Admitting: Surgical

## 2021-07-17 MED ORDER — MELOXICAM 15 MG PO TABS: 15.0000 mg | ORAL_TABLET | Freq: Every day | ORAL | 2 refills | Status: DC

## 2021-07-17 NOTE — Telephone Encounter (Signed)
Sent in

## 2021-07-18 NOTE — Telephone Encounter (Signed)
I called to advise. No answer.

## 2021-07-18 NOTE — Telephone Encounter (Signed)
I called and advised. 

## 2021-07-25 ENCOUNTER — Encounter: Payer: Self-pay | Admitting: Family Medicine

## 2021-07-25 ENCOUNTER — Telehealth (INDEPENDENT_AMBULATORY_CARE_PROVIDER_SITE_OTHER): Payer: Medicare PPO | Admitting: Family Medicine

## 2021-07-25 DIAGNOSIS — R600 Localized edema: Secondary | ICD-10-CM

## 2021-07-25 DIAGNOSIS — M79671 Pain in right foot: Secondary | ICD-10-CM

## 2021-07-25 DIAGNOSIS — M150 Primary generalized (osteo)arthritis: Secondary | ICD-10-CM

## 2021-07-25 DIAGNOSIS — M159 Polyosteoarthritis, unspecified: Secondary | ICD-10-CM

## 2021-07-25 MED ORDER — TRAMADOL HCL 50 MG PO TABS: 50.0000 mg | ORAL_TABLET | Freq: Three times a day (TID) | ORAL | 0 refills | Status: AC | PRN

## 2021-07-25 NOTE — Progress Notes (Signed)
Virtual Visit via Video Note  I connected with [name] on 07/25/21 at  4:30 PM EDT by a video enabled telemedicine application 2/2 [id number] pandemic and verified that I am speaking with the correct person using two identifiers.  Location patient: home Location provider:work or home office Persons participating in the virtual visit: patient, provider  I discussed the limitations of evaluation and management by telemedicine and the availability of in person appointments. The patient expressed understanding and agreed to proceed.   HPI: Pt with intermittent pain in top of R foot.  Pain is in middle of midfoot. Endorses h/o OA in b/l knees R>L which cause her to limp.   Started trying to walk without bending knees due to the increased knee pain.  Pt was trying to walk more, but her foot started hurting.  Endorses continued ankle edema.  Patient eats out as she does not cook at home.  Endorses having fried seafood recently.  Uunable to use a cane 2/2 carpal tunnel.  Unable to get a cortisone shot in knee until Dec. with Ortho.  Denies erythema, pain with bed sheets touching foot.  Pt wearing tennis shoes that are several yrs old.   ROS: See pertinent positives and negatives per HPI.  Past Medical History:  Diagnosis Date   Acute bronchitis    Allergy    Anxiety    Arthritis    knees   Asthma    Backache, unspecified    Cataract    removed both eyes   Depression    Diverticulitis    Diverticulosis of colon (without mention of hemorrhage)    Edema    Glaucoma    Lumbago    Obesity    Other chest pain    Pain in joint, ankle and foot    Sarcoidosis    Tibialis tendinitis    Unspecified asthma(493.90)     Past Surgical History:  Procedure Laterality Date   ABDOMINAL HYSTERECTOMY     CATARACT EXTRACTION W/PHACO Right 06/24/2014   Procedure: CATARACT EXTRACTION PHACO AND INTRAOCULAR LENS PLACEMENT ([place]) RIGHT EYE WITH GONIOSYNECHIALYSIS;  Surgeon: Marylynn Pearson, MD;  Location:  [facility];  Service: Ophthalmology;  Laterality: Right;   COLONOSCOPY     EYE SURGERY Left    cataract surgery   HAND SURGERY     IR RADIOLOGIST EVAL & MGMT  11/14/2018   KNEE ARTHROSCOPY Right     Family History  Problem Relation Age of Onset   Hypertension Mother    Stroke Mother    Diabetes Father    Sarcoidosis Sister    Colon cancer Neg Hx    Colon polyps Neg Hx    Esophageal cancer Neg Hx    Rectal cancer Neg Hx    Stomach cancer Neg Hx     Current Outpatient Medications:    albuterol (VENTOLIN HFA) 108 (90 Base) MCG/ACT inhaler, TAKE 2 PUFFS BY MOUTH EVERY 6 HOURS AS NEEDED, Disp: 18 g, Rfl: 3   cycloSPORINE (RESTASIS) 0.05 % ophthalmic emulsion, Restasis 0.05 % eye drops in a dropperette, Disp: , Rfl:    estradiol (ESTRACE) 1 MG tablet, Take 1 tablet (1 mg total) by mouth daily., Disp: 90 tablet, Rfl: 3   FLUoxetine (PROZAC) 10 MG capsule, Take 10 mg by mouth daily., Disp: , Rfl:    furosemide (LASIX) 40 MG tablet, Take 0.5 tablets (20 mg total) by mouth as needed (swelling)., Disp: 30 tablet, Rfl: 1   Ginkgo Biloba (GINKOBA PO), Take 1 capsule  by mouth daily., Disp: , Rfl:    loratadine (CLARITIN) 10 MG tablet, Take 1 tablet (10 mg total) by mouth daily., Disp: 30 tablet, Rfl: 11   meloxicam (MOBIC) 15 MG tablet, Take 1 tablet (15 mg total) by mouth daily., Disp: 30 tablet, Rfl: 2   Multiple Vitamins-Minerals (MULTIVITAMIN WITH MINERALS) tablet, Take 1 tablet by mouth daily., Disp: , Rfl:    OVER THE COUNTER MEDICATION, CBD Oil daily at bedtime, Disp: , Rfl:    predniSONE (DELTASONE) 10 MG tablet, Take 6 tabs every morning for 3 days, 5 tabs for 3 days, 4 tabs for 2 days, 3 tabs for 2 days, 2 tabs for 2 days, 1 tab for 2 days., Disp: 53 tablet, Rfl: 0   Vitamin D, Ergocalciferol, (DRISDOL) 1.25 MG (50000 UNIT) CAPS capsule, Take 1 capsule (50,000 Units total) by mouth every 7 (seven) days., Disp: 12 capsule, Rfl: 0   WIXELA INHUB 250-50 MCG/ACT AEPB, INHALE 1 PUFF BY MOUTH  TWICE A DAY, Disp: 120 each, Rfl: 1   ZIOPTAN 0.0015 % SOLN, Place 1 drop into both eyes at bedtime., Disp: , Rfl:   EXAM:  VITALS per patient if applicable: RR between 84-69 bpm  GENERAL: alert, oriented, appears well and in no acute distress  HEENT: atraumatic, conjunctiva clear, no obvious abnormalities on inspection of external nose and ears  NECK: normal movements of the head and neck  LUNGS: on inspection no signs of respiratory distress, breathing rate appears normal, no obvious gross SOB, gasping or wheezing  CV: no obvious cyanosis  MS: R midfoot pain, ankle edema.  moves all visible extremities without noticeable abnormality  PSYCH/NEURO: pleasant and cooperative, no obvious depression or anxiety, speech and thought processing grossly intact  ASSESSMENT AND PLAN:  Discussed the following assessment and plan:  Right foot pain  -Likely from malalignment/adjusting walk due to increased knee pain from OA -Discussed possible issues such as sprain versus stress fracture -Discussed supportive care including ice, heat, rest, NSAIDs, Tylenol.  Patient encouraged to obtain new tennis shoes. -Will obtain imaging tomorrow -Discussed using tramadol as needed for breakthrough pain -Further recommendations based on imaging. - Plan: DG Foot Complete Right, traMADol (ULTRAM) 50 MG tablet  Lower extremity edema -Advised likely dependent edema 2/2 increased sodium intake -Supportive care including elevating LEs, compression socks, TED hose - Plan: DG Foot Complete Right  Primary osteoarthritis involving multiple joints -Continue supportive care including topical analgesics such as Voltaren gel -Continue follow-up with Ortho  I discussed the assessment and treatment plan with the patient. The patient was provided an opportunity to ask questions and all were answered. The patient agreed with the plan and demonstrated an understanding of the instructions.   The patient was advised to  call back or seek an in-person evaluation if the symptoms worsen or if the condition fails to improve as anticipated.   [name], MD

## 2021-07-26 ENCOUNTER — Telehealth: Payer: Self-pay | Admitting: Family Medicine

## 2021-07-26 ENCOUNTER — Other Ambulatory Visit: Payer: Medicare PPO

## 2021-07-26 NOTE — Telephone Encounter (Signed)
Patient called because she was scheduled for xrays to be done at [facility] when they are closed for renovations. Appointment was rescheduled for 10/26 but she would like Dr.Banks to know that she is very upset at this mishap in scheduling, especially since she is not feeling well.       Good callback number is [phone]      Please Advise

## 2021-07-27 ENCOUNTER — Other Ambulatory Visit: Payer: Medicare PPO

## 2021-07-29 ENCOUNTER — Other Ambulatory Visit: Payer: Self-pay

## 2021-07-29 ENCOUNTER — Telehealth: Payer: Self-pay | Admitting: Family Medicine

## 2021-07-29 ENCOUNTER — Emergency Department (HOSPITAL_COMMUNITY)
Admission: EM | Admit: 2021-07-29 | Discharge: 2021-07-30 | Disposition: A | Discharge status: Home / Self Care | Payer: Medicare PPO | Attending: Emergency Medicine | Admitting: Emergency Medicine

## 2021-07-29 ENCOUNTER — Encounter (HOSPITAL_COMMUNITY): Payer: Self-pay

## 2021-07-29 ENCOUNTER — Emergency Department (HOSPITAL_COMMUNITY): Payer: Medicare PPO

## 2021-07-29 ENCOUNTER — Other Ambulatory Visit: Payer: Medicare PPO

## 2021-07-29 DIAGNOSIS — M79671 Pain in right foot: Secondary | ICD-10-CM | POA: Diagnosis not present

## 2021-07-29 DIAGNOSIS — L03115 Cellulitis of right lower limb: Secondary | ICD-10-CM | POA: Diagnosis not present

## 2021-07-29 DIAGNOSIS — Z5321 Procedure and treatment not carried out due to patient leaving prior to being seen by health care provider: Secondary | ICD-10-CM | POA: Insufficient documentation

## 2021-07-29 DIAGNOSIS — M7989 Other specified soft tissue disorders: Secondary | ICD-10-CM | POA: Diagnosis not present

## 2021-07-29 LAB — COMPREHENSIVE METABOLIC PANEL
ALT: 16 U/L (ref 0–44)
AST: 26 U/L (ref 15–41)
Albumin: 3.6 g/dL (ref 3.5–5.0)
Alkaline Phosphatase: 88 U/L (ref 38–126)
Anion gap: 9 (ref 5–15)
BUN: 12 mg/dL (ref 8–23)
CO2: 26 mmol/L (ref 22–32)
Calcium: 9 mg/dL (ref 8.9–10.3)
Chloride: 103 mmol/L (ref 98–111)
Creatinine, Ser: 0.66 mg/dL (ref 0.44–1.00)
GFR, Estimated: >60 mL/min (ref >60)
Glucose, Bld: 118 mg/dL — ABNORMAL HIGH (ref 70–99)
Potassium: 3.4 mmol/L — ABNORMAL LOW (ref 3.5–5.1)
Sodium: 138 mmol/L (ref 135–145)
Total Bilirubin: 0.5 mg/dL (ref 0.3–1.2)
Total Protein: 7.9 g/dL (ref 6.5–8.1)

## 2021-07-29 LAB — CBC WITH DIFFERENTIAL/PLATELET
Abs Immature Granulocytes: 0.03 10*3/uL (ref 0.00–0.07)
Basophils Absolute: 0.1 10*3/uL (ref 0.0–0.1)
Basophils Relative: 1 %
Eosinophils Absolute: 0.2 10*3/uL (ref 0.0–0.5)
Eosinophils Relative: 2 %
HCT: 37.2 % (ref 36.0–46.0)
Hemoglobin: 11.6 g/dL — ABNORMAL LOW (ref 12.0–15.0)
Immature Granulocytes: 0 %
Lymphocytes Relative: 15 %
Lymphs Abs: 1.5 10*3/uL (ref 0.7–4.0)
MCH: 28.9 pg (ref 26.0–34.0)
MCHC: 31.2 g/dL (ref 30.0–36.0)
MCV: 92.8 fL (ref 80.0–100.0)
Monocytes Absolute: 0.9 10*3/uL (ref 0.1–1.0)
Monocytes Relative: 9 %
Neutro Abs: 7.7 10*3/uL (ref 1.7–7.7)
Neutrophils Relative %: 73 %
Platelets: 379 10*3/uL (ref 150–400)
RBC: 4.01 MIL/uL (ref 3.87–5.11)
RDW: 15.5 % (ref 11.5–15.5)
WBC: 10.4 10*3/uL (ref 4.0–10.5)
nRBC: 0 % (ref 0.0–0.2)

## 2021-07-29 LAB — LACTIC ACID, PLASMA: Lactic Acid, Venous: 1.3 mmol/L (ref 0.5–1.9)

## 2021-07-29 NOTE — Telephone Encounter (Signed)
Pt called by this RN to express concern that pt should be seen by a HCP prior to Monday. Pt states that she has increased swelling in foot. Pt advised that she should keep xray appt OR be seen at [facility] today due to persistent symptoms as described in VV on 10/24. Pt advised to go to [facility] as walk in or should could request a same day appt. Number given to pt & pt states she will f/u with [facility] & call back to cancel Mon 10/31 appt if able to be seen today.Pt verb understanding.

## 2021-07-29 NOTE — Telephone Encounter (Signed)
Patient called and was triaged for her swollen foot that she was seen for previously in office earlier this week. Patient was advised by me that there was no appointment for any providers available today, but she was scheduled to have an xray done. Patient expressed concern for xray needing to be read the same day for results, which I responded that we could not guarantee that they would be read the same day and it sounded like she needed to be seen at an urgent care since it sounded like her foot was getting worse. Patient stated that he was unable to get shoe on and was in pain. I advised multiple times that there was no appointments for today and she should look into going to an urgent care. Patient stated she did not want to exert energy just to have xray done today in office with the possibility it would not be read the same day. Patient asked about an appointment on Monday and having xray done then and being read the same day for a plan of care to be made. I let patient know that I could schedule her for Monday but it still would not guarantee results being read the same day. Patient did state that if it got worse she would have to go to ED but expressed that she really did not want to go over halloween weekend due to possibly being busy. Patient is scheduled for Monday 10/31 with Dr.Burchette as of now

## 2021-07-29 NOTE — ED Triage Notes (Signed)
Pt states her ortho MD said she has cellulites in right foot and needs to come to the ED for  blood work and ABX. Pt states pain 6/10 when walking and without walking 3/10

## 2021-07-30 ENCOUNTER — Encounter (HOSPITAL_BASED_OUTPATIENT_CLINIC_OR_DEPARTMENT_OTHER): Payer: Self-pay | Admitting: Emergency Medicine

## 2021-07-30 ENCOUNTER — Emergency Department (HOSPITAL_BASED_OUTPATIENT_CLINIC_OR_DEPARTMENT_OTHER)
Admission: EM | Admit: 2021-07-30 | Discharge: 2021-07-30 | Disposition: A | Discharge status: Home / Self Care | Payer: Medicare PPO | Source: Home / Self Care | Attending: Emergency Medicine | Admitting: Emergency Medicine

## 2021-07-30 ENCOUNTER — Emergency Department (HOSPITAL_COMMUNITY): Payer: Medicare PPO

## 2021-07-30 DIAGNOSIS — Z7951 Long term (current) use of inhaled steroids: Secondary | ICD-10-CM | POA: Insufficient documentation

## 2021-07-30 DIAGNOSIS — R739 Hyperglycemia, unspecified: Secondary | ICD-10-CM | POA: Insufficient documentation

## 2021-07-30 DIAGNOSIS — M7989 Other specified soft tissue disorders: Secondary | ICD-10-CM | POA: Diagnosis not present

## 2021-07-30 DIAGNOSIS — J45909 Unspecified asthma, uncomplicated: Secondary | ICD-10-CM | POA: Insufficient documentation

## 2021-07-30 DIAGNOSIS — L03115 Cellulitis of right lower limb: Secondary | ICD-10-CM | POA: Diagnosis not present

## 2021-07-30 DIAGNOSIS — E876 Hypokalemia: Secondary | ICD-10-CM | POA: Insufficient documentation

## 2021-07-30 DIAGNOSIS — M79671 Pain in right foot: Secondary | ICD-10-CM | POA: Diagnosis not present

## 2021-07-30 MED ORDER — IPRATROPIUM-ALBUTEROL 0.5-2.5 (3) MG/3ML IN SOLN
3.0000 mL | Freq: Once | RESPIRATORY_TRACT | Status: AC
  Administered 2021-07-30: 3 mL via RESPIRATORY_TRACT
  Filled 2021-07-30: qty 3

## 2021-07-30 MED ORDER — DOXYCYCLINE HYCLATE 100 MG PO CAPS: 100.0000 mg | ORAL_CAPSULE | Freq: Two times a day (BID) | ORAL | 0 refills | Status: DC

## 2021-07-30 MED ORDER — DOXYCYCLINE HYCLATE 100 MG PO TABS
100.0000 mg | ORAL_TABLET | Freq: Once | ORAL | Status: AC
  Administered 2021-07-30: 100 mg via ORAL
  Filled 2021-07-30: qty 1

## 2021-07-30 NOTE — Discharge Instructions (Addendum)
You have a superficial skin infection called cellulitis of your right foot.  He had blood work done yesterday which I reviewed which did not show elevated white count.  Your foot x-ray was without fracture.  I will start you on doxycycline.  You received your first dose here tonight.  Pick up the antibiotics and start taking them tomorrow morning.  Follow-up with your PCP Monday afternoon as scheduled.  If you have worsening in your pain inability to bear weight please return to the emergency room for evaluation.

## 2021-07-30 NOTE — ED Provider Notes (Signed)
[facility] EMERGENCY DEPT Provider Note   CSN: [id number] Arrival date & time: 07/30/21  1653     History Chief Complaint  Patient presents with   Foot Swelling    Peighton Mehra Greenwell is a 70 y.o. female.  70 year old female presents today for evaluation of 3-week duration of right foot pain.  Patient denies known injury or trauma to the right foot.  Patient does have history of osteoarthritis of both knee worse on the right.  Patient reports her arthritis has been flaring up recently requiring her to change her gait.  She reports she has either been walking on the inside of her right foot or on the right heel.  She denies any fever, chills, history of gout, or difficulty breathing.  Patient reports she was seen at [facility] yesterday who evaluated her with an x-ray but given concern for mild Ortho pathology they referred her to [facility].  Patient reports she went to [facility] had blood work done but given extensive wait time she left without being seen.  She states she has an upcoming appointment with her primary care provider Monday afternoon.  She has been taking tramadol with good relief of her pain.  The history is provided by the patient. No language interpreter was used.      Past Medical History:  Diagnosis Date   Acute bronchitis    Allergy    Anxiety    Arthritis    knees   Asthma    Backache, unspecified    Cataract    removed both eyes   Depression    Diverticulitis    Diverticulosis of colon (without mention of hemorrhage)    Edema    Glaucoma    Lumbago    Obesity    Other chest pain    Pain in joint, ankle and foot    Sarcoidosis    Tibialis tendinitis    Unspecified asthma(493.90)     Patient Active Problem List   Diagnosis Date Noted   Osteoporosis 01/28/2021   Osteoarthritis of right wrist 01/28/2021   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   Pain due to onychomycosis of toenails of both feet 09/13/2020   Chronic pain of right knee  08/24/2018   Fall in home 07/14/2015   TIBIALIS TENDINITIS 08/25/2009   CHEST PAIN, OTHER, PAIN 10/21/2008   PEDAL EDEMA 07/22/2008   ANKLE PAIN, BILATERAL 04/01/2008   LOW BACK PAIN 04/01/2008   ACUTE BRONCHITIS 09/23/2007   SARCOIDOSIS 09/02/2007   Asthma 09/02/2007   Diverticulosis of colon 07/11/2007   BACK PAIN, CHRONIC 07/11/2007    Past Surgical History:  Procedure Laterality Date   ABDOMINAL HYSTERECTOMY     CATARACT EXTRACTION W/PHACO Right 06/24/2014   Procedure: CATARACT EXTRACTION PHACO AND INTRAOCULAR LENS PLACEMENT ([place]) RIGHT EYE WITH GONIOSYNECHIALYSIS;  Surgeon: Marylynn Pearson, MD;  Location: [facility];  Service: Ophthalmology;  Laterality: Right;   COLONOSCOPY     EYE SURGERY Left    cataract surgery   HAND SURGERY     IR RADIOLOGIST EVAL & MGMT  11/14/2018   KNEE ARTHROSCOPY Right      OB History   No obstetric history on file.     Family History  Problem Relation Age of Onset   Hypertension Mother    Stroke Mother    Diabetes Father    Sarcoidosis Sister    Colon cancer Neg Hx    Colon polyps Neg Hx    Esophageal cancer Neg Hx  Rectal cancer Neg Hx    Stomach cancer Neg Hx     Social History   Tobacco Use   Smoking status: Never   Smokeless tobacco: Never  Substance Use Topics   Alcohol use: Yes    Alcohol/week: 1.0 standard drink    Types: 1 Standard drinks or equivalent per week    Comment: social   Drug use: No    Home Medications Prior to Admission medications   Medication Sig Start Date End Date Taking? Authorizing Provider  estradiol (ESTRACE) 1 MG tablet Take 1 tablet (1 mg total) by mouth daily. 06/11/12  Yes Marletta Lor, MD  albuterol (VENTOLIN HFA) 108 (90 Base) MCG/ACT inhaler TAKE 2 PUFFS BY MOUTH EVERY 6 HOURS AS NEEDED Patient taking differently: Inhale 2 puffs into the lungs every 6 (six) hours as needed for shortness of breath or wheezing. 11/26/20   [name], MD  cycloSPORINE (RESTASIS) 0.05 % ophthalmic  emulsion Restasis 0.05 % eye drops in a dropperette Patient not taking: Reported on 07/30/2021    [provider]  FLUoxetine (PROZAC) 10 MG capsule Take 10 mg by mouth daily.    [provider]  furosemide (LASIX) 40 MG tablet Take 0.5 tablets (20 mg total) by mouth as needed (swelling). Patient taking differently: Take 40 mg by mouth daily as needed for edema (swelling). 05/26/21   [name], MD  Ginkgo Biloba (GINKOBA PO) Take 1 capsule by mouth daily.    [provider]  loratadine (CLARITIN) 10 MG tablet Take 1 tablet (10 mg total) by mouth daily. 11/26/20   [name], MD  meloxicam (MOBIC) 15 MG tablet Take 1 tablet (15 mg total) by mouth daily. 07/17/21   Magnant, Gerrianne Scale, PA-C  Multiple Vitamins-Minerals (MULTIVITAMIN WITH MINERALS) tablet Take 1 tablet by mouth daily.    [provider]  predniSONE (DELTASONE) 10 MG tablet Take 6 tabs every morning for 3 days, 5 tabs for 3 days, 4 tabs for 2 days, 3 tabs for 2 days, 2 tabs for 2 days, 1 tab for 2 days. 05/26/21   [name], MD  traMADol (ULTRAM) 50 MG tablet Take 1 tablet (50 mg total) by mouth every 8 (eight) hours as needed for up to 5 days. 07/25/21 07/30/21  [name], MD  Vitamin D, Ergocalciferol, (DRISDOL) 1.25 MG (50000 UNIT) CAPS capsule Take 1 capsule (50,000 Units total) by mouth every 7 (seven) days. 05/26/21   [name], MD  WIXELA INHUB 250-50 MCG/ACT AEPB INHALE 1 PUFF BY MOUTH TWICE A DAY Patient taking differently: Inhale 1 puff into the lungs in the morning and at bedtime. 06/07/21   [name], MD  ZIOPTAN 0.0015 % SOLN Place 1 drop into both eyes at bedtime. Patient not taking: Reported on 07/30/2021 10/15/18   [provider]    Allergies    Ibuprofen and Sulfa antibiotics  Review of Systems   Review of Systems  Constitutional:  Negative for activity change, appetite change and fever.  Respiratory:  Negative for shortness of breath.    Cardiovascular:  Negative for chest pain.  Musculoskeletal:  Positive for arthralgias and gait problem.  Neurological:  Negative for weakness and light-headedness.   Physical Exam Updated Vital Signs BP (!) 152/78   Pulse (!) 101   Temp 98.4 F (36.9 C) (Oral)   Resp 20   Ht 5' (1.524 m)   Wt 99.8 kg   SpO2 100%   BMI  42.97 kg/m   Physical Exam Vitals and nursing note reviewed.  Constitutional:      General: She is not in acute distress.    Appearance: Normal appearance. She is not ill-appearing.  HENT:     Head: Normocephalic and atraumatic.     Nose: Nose normal.  Eyes:     General: No scleral icterus.    Extraocular Movements: Extraocular movements intact.     Conjunctiva/sclera: Conjunctivae normal.  Cardiovascular:     Rate and Rhythm: Normal rate and regular rhythm.     Pulses: Normal pulses.     Heart sounds: Normal heart sounds.  Pulmonary:     Effort: Pulmonary effort is normal. No respiratory distress.     Breath sounds: Normal breath sounds. No wheezing.  Abdominal:     General: There is no distension.     Tenderness: There is no abdominal tenderness.  Musculoskeletal:        General: Normal range of motion.     Cervical back: Normal range of motion.     Right lower leg: No edema.     Left lower leg: No edema.  Feet:     Comments: Patient has diffuse swelling of right foot with associated erythema and warmth of the forefoot with tenderness to palpation present.  Patient has full range of motion in her right ankle.  Patient able to move all digits of her right foot.  Cap refill less than 2 seconds.  Patient has 2+ DP pulses bilaterally. Skin:    General: Skin is warm and dry.  Neurological:     General: No focal deficit present.     Mental Status: She is alert. Mental status is at baseline.       ED Results / Procedures / Treatments   Labs (all labs ordered are listed, but only abnormal results are displayed) Labs Reviewed - No data to  display  EKG None  Radiology DG Foot Complete Right  Result Date: 07/30/2021 CLINICAL DATA:  Right foot pain EXAM: RIGHT FOOT COMPLETE - 3+ VIEW COMPARISON:  None. FINDINGS: No fracture or dislocation is seen. Mild moderate degenerative changes of the 1st MTP joint. Moderate dorsal soft tissue swelling along the forefoot. No underlying cortical destruction to suggest acute osteomyelitis. IMPRESSION: Moderate dorsal soft tissue swelling. No findings to suggest acute osteomyelitis. Electronically Signed   By: Julian Hy M.D.   On: 07/30/2021 01:19    Procedures Procedures   Medications Ordered in ED Medications  ipratropium-albuterol (DUONEB) 0.5-2.5 (3) MG/3ML nebulizer solution 3 mL (3 mLs Nebulization Given 07/30/21 1926)    ED Course  I have reviewed the triage vital signs and the nursing notes.  Pertinent labs & imaging results that were available during my care of the patient were reviewed by me and considered in my medical decision making (see chart for details).    MDM Rules/Calculators/A&P                           70 year old female presents today for evaluation of right foot pain of 3-week duration.  Patient is neurovascularly intact in her right foot.  She does have significant erythema, warmth, and swelling of the right foot.  I personally reviewed her blood work from yesterday which was without leukocytosis, CMP with mild hypokalemia at 3.4 and hyperglycemia at 118 otherwise unremarkable, lactic acid of 1.3, and blood cultures which are pending.  Patient left without being seen so was not started  on any treatment.  Patient had a x-ray of right foot which I independently reviewed as well and agree that there is no evidence of fracture or underlying osteomyelitis.  discharged on doxycycline.  Return precautions discussed.  Patient has an appointment with PCP scheduled for Monday afternoon.  Discussed importance of keeping that appointment for reevaluation.  Patient  voices understanding and is in agreement with plan.  Final Clinical Impression(s) / ED Diagnoses Final diagnoses:  None    Rx / DC Orders ED Discharge Orders     None        Evlyn Courier, PA-C 07/30/21 2204    Wyvonnia Dusky, MD 07/31/21 1015

## 2021-07-30 NOTE — ED Triage Notes (Signed)
Pt arrives pov with c/o redness and swelling in right foot, states "I need abx". Reports PCP endorses cellulitis. Pt denies injury. Pt reports AMA from [facility]. Endorses tramadol at 1400 with some relief.

## 2021-08-01 ENCOUNTER — Ambulatory Visit: Payer: Medicare PPO | Admitting: Family Medicine

## 2021-08-03 ENCOUNTER — Ambulatory Visit (INDEPENDENT_AMBULATORY_CARE_PROVIDER_SITE_OTHER): Payer: Medicare PPO

## 2021-08-03 ENCOUNTER — Ambulatory Visit: Payer: Medicare PPO | Admitting: Family Medicine

## 2021-08-03 VITALS — BP 130/98 | HR 95 | Temp 98.5°F

## 2021-08-03 DIAGNOSIS — R600 Localized edema: Secondary | ICD-10-CM

## 2021-08-03 DIAGNOSIS — R002 Palpitations: Secondary | ICD-10-CM | POA: Diagnosis not present

## 2021-08-03 DIAGNOSIS — D869 Sarcoidosis, unspecified: Secondary | ICD-10-CM | POA: Diagnosis not present

## 2021-08-03 DIAGNOSIS — R062 Wheezing: Secondary | ICD-10-CM | POA: Diagnosis not present

## 2021-08-03 DIAGNOSIS — J029 Acute pharyngitis, unspecified: Secondary | ICD-10-CM | POA: Diagnosis not present

## 2021-08-03 DIAGNOSIS — E876 Hypokalemia: Secondary | ICD-10-CM

## 2021-08-03 DIAGNOSIS — R059 Cough, unspecified: Secondary | ICD-10-CM

## 2021-08-03 DIAGNOSIS — M79671 Pain in right foot: Secondary | ICD-10-CM | POA: Diagnosis not present

## 2021-08-03 DIAGNOSIS — L03115 Cellulitis of right lower limb: Secondary | ICD-10-CM

## 2021-08-03 LAB — POC COVID19 BINAXNOW: SARS Coronavirus 2 Ag: NEGATIVE

## 2021-08-03 LAB — POCT INFLUENZA A/B
Influenza A, POC: NEGATIVE
Influenza B, POC: NEGATIVE

## 2021-08-03 NOTE — Progress Notes (Signed)
Subjective:    Patient ID: [name], female    DOB: [birth year], 70 y.o.   MRN: [id number]  Chief Complaint  Patient presents with   Foot Swelling    HPI Patient was seen today for follow-up.  Patient seen virtually on 10/24 for foot pain.  Imaging ordered however patient was unable to have it done as the office she wanted to go to was closed.   Seen in went to ED on 10/28, but LWBS.  Went back on 07/30/2021 for foot pain, advised 2/2 cellulitis.  Started on doxycycline.  States tramadol not working for pain.  Unable to get R foot in shoe.  While in ED pt given a breathing treatment as wheezing noted.  Pt also had palpations/tachycardia over the wknd. Denies SOB, CP, wheezing.    Pt with cough,  sore throat, and congestion.  Denies fever.  Pt is an Forensic psychologist.  She states she cannot afford to be unable to get around as she has work that needs to get done.   Past Medical History:  Diagnosis Date   Acute bronchitis    Allergy    Anxiety    Arthritis    knees   Asthma    Backache, unspecified    Cataract    removed both eyes   Depression    Diverticulitis    Diverticulosis of colon (without mention of hemorrhage)    Edema    Glaucoma    Lumbago    Obesity    Other chest pain    Pain in joint, ankle and foot    Sarcoidosis    Tibialis tendinitis    Unspecified asthma(493.90)     Allergies  Allergen Reactions   Ibuprofen     rash   Sulfa Antibiotics Rash    ROS General: Denies fever, chills, night sweats, changes in weight, changes in appetite HEENT: Denies headaches, ear pain, changes in vision, rhinorrhea, sore throat CV: Denies CP, palpitations, SOB, orthopnea Pulm: Denies SOB, cough, wheezing GI: Denies abdominal pain, nausea, vomiting, diarrhea, constipation GU: Denies dysuria, hematuria, frequency, vaginal discharge Msk: Denies muscle cramps, joint pains +R foot pain, edema Neuro: Denies weakness, numbness, tingling Skin: Denies rashes, bruising Psych:  Denies depression, anxiety, hallucinations     Objective:    Blood pressure (!) 130/98, pulse 95, temperature 98.5 F (36.9 C), temperature source Oral, SpO2 98 %.  Gen. Pleasant, well-nourished, in no distress, normal affect   HEENT: [place]/AT, face symmetric, conjunctiva clear, no scleral icterus, PERRLA, EOMI, nares patent without drainage, pharynx without erythema or exudate. Tms normal b/l. Lungs: no accessory muscle use, mild wheezing on exhalation in anterior LU chest.  Moving good air.  No rhonchi or rales. Cardiovascular: RRR, no m/r/g, pedal edema.  DP and PT pulses intact 2+ Musculoskeletal: Edema, mild erythema of R foot.  No deformities, no cyanosis or clubbing, normal tone Neuro:  A&Ox3, CN II-XII intact, gait not assessed as sitting in transport wheelchair with socks and no shoes. Skin:  Warm, no lesions/ rash   Wt Readings from Last 3 Encounters:  07/30/21 220 lb (99.8 kg)  07/29/21 220 lb (99.8 kg)  05/26/21 222 lb 9.6 oz (101 kg)    Lab Results  Component Value Date   WBC 10.4 07/29/2021   HGB 11.6 (L) 07/29/2021   HCT 37.2 07/29/2021   PLT 379 07/29/2021   GLUCOSE 118 (H) 07/29/2021   CHOL 188 09/01/2020   TRIG 54 09/01/2020   HDL 64 09/01/2020  [place] 110 (H) 09/01/2020   ALT 16 07/29/2021   AST 26 07/29/2021   NA 138 07/29/2021   K 3.4 (L) 07/29/2021   CL 103 07/29/2021   CREATININE 0.66 07/29/2021   BUN 12 07/29/2021   CO2 26 07/29/2021   TSH 1.14 09/01/2020   HGBA1C 5.6 09/01/2020    Assessment/Plan:  Cellulitis of right lower extremity  -Encouraged to complete course of doxycycline -elevated LEs -advised to monitor for s/s of worsening infection - Plan: CBC with Differential/Platelet  Cough, unspecified type  -viral etiology -supportive care including honey, warm fluids, OTC cough/cold medicaiton, rest - Plan: POC COVID-19, POC Influenza A/B, COVID-19, Flu A+B and RSV  Sore throat  -viral etiology -Rapid influenza and COVID testing  negative - Plan: POC Influenza A/B  Wheezing  - Plan: DG Chest 2 View, COVID-19, Flu A+B and RSV  Lower extremity edema  -supportive care including elevation - Plan: Brain Natriuretic Peptide  Palpitations -Limit caffeine intake -Discussed obtaining labs to evaluate for electrolyte deficiency  Sarcoidosis -Stable  Hypokalemia  -Potassium 3.4 on 07/29/2021 -Replete - Plan: Basic metabolic panel  Right foot pain -Likely causing elevation in BP -Discussed possible causes -Obtain labs to evaluate for gout, infection -X-ray right foot on 07/30/2021 with moderate degenerative changes of first MTP joint.  Moderate dorsal soft tissue swelling.  No findings to suggest acute osteomyelitis. -Continue supportive care including elevation, topical analgesics  F/u as needed  Grier Mitts, MD

## 2021-08-04 LAB — CULTURE, BLOOD (SINGLE)
Culture: NO GROWTH
Special Requests: ADEQUATE

## 2021-08-09 ENCOUNTER — Telehealth: Payer: Self-pay

## 2021-08-09 NOTE — Telephone Encounter (Signed)
Patient called stating that the antibiotics she has been taking does not seem to be working for Cellulitis of right lower extremity and would like to know if anything stronger can be given I informed patient PCP is out of the office and she may not hear back from PCP until another day patient asked if another doctor could give her any recommendations.

## 2021-08-11 ENCOUNTER — Encounter: Payer: Self-pay | Admitting: Family Medicine

## 2021-08-11 NOTE — Telephone Encounter (Signed)
Patient should be reevaluated in person.

## 2021-08-12 ENCOUNTER — Ambulatory Visit: Payer: Medicare PPO | Admitting: Family Medicine

## 2021-08-15 ENCOUNTER — Other Ambulatory Visit: Payer: Medicare PPO

## 2021-08-17 ENCOUNTER — Encounter: Payer: Self-pay | Admitting: Family Medicine

## 2021-08-17 ENCOUNTER — Ambulatory Visit (INDEPENDENT_AMBULATORY_CARE_PROVIDER_SITE_OTHER): Payer: Medicare PPO | Admitting: Family Medicine

## 2021-08-17 ENCOUNTER — Other Ambulatory Visit: Payer: Medicare PPO

## 2021-08-17 VITALS — BP 138/88 | HR 102 | Temp 98.3°F

## 2021-08-17 DIAGNOSIS — J3089 Other allergic rhinitis: Secondary | ICD-10-CM | POA: Diagnosis not present

## 2021-08-17 DIAGNOSIS — R600 Localized edema: Secondary | ICD-10-CM

## 2021-08-17 DIAGNOSIS — M79671 Pain in right foot: Secondary | ICD-10-CM | POA: Diagnosis not present

## 2021-08-17 DIAGNOSIS — E876 Hypokalemia: Secondary | ICD-10-CM | POA: Diagnosis not present

## 2021-08-17 LAB — BASIC METABOLIC PANEL
BUN: 11 mg/dL (ref 6–23)
CO2: 26 mEq/L (ref 19–32)
Calcium: 8.7 mg/dL (ref 8.4–10.5)
Chloride: 101 mEq/L (ref 96–112)
Creatinine, Ser: 0.72 mg/dL (ref 0.40–1.20)
GFR: 83.6 mL/min (ref >60.00)
Glucose, Bld: 130 mg/dL — ABNORMAL HIGH (ref 70–99)
Potassium: 3.1 mEq/L — ABNORMAL LOW (ref 3.5–5.1)
Sodium: 139 mEq/L (ref 135–145)

## 2021-08-17 LAB — CBC WITH DIFFERENTIAL/PLATELET
Basophils Absolute: 0.1 10*3/uL (ref 0.0–0.1)
Basophils Relative: 1 % (ref 0.0–3.0)
Eosinophils Absolute: 0.5 10*3/uL (ref 0.0–0.7)
Eosinophils Relative: 6.4 % — ABNORMAL HIGH (ref 0.0–5.0)
HCT: 32.5 % — ABNORMAL LOW (ref 36.0–46.0)
Hemoglobin: 10.6 g/dL — ABNORMAL LOW (ref 12.0–15.0)
Lymphocytes Relative: 25.7 % (ref 12.0–46.0)
Lymphs Abs: 2 10*3/uL (ref 0.7–4.0)
MCHC: 32.6 g/dL (ref 30.0–36.0)
MCV: 89.4 fl (ref 78.0–100.0)
Monocytes Absolute: 0.6 10*3/uL (ref 0.1–1.0)
Monocytes Relative: 7.9 % (ref 3.0–12.0)
Neutro Abs: 4.5 10*3/uL (ref 1.4–7.7)
Neutrophils Relative %: 59 % (ref 43.0–77.0)
Platelets: 427 10*3/uL — ABNORMAL HIGH (ref 150.0–400.0)
RBC: 3.63 Mil/uL — ABNORMAL LOW (ref 3.87–5.11)
RDW: 15.3 % (ref 11.5–15.5)
WBC: 7.7 10*3/uL (ref 4.0–10.5)

## 2021-08-17 LAB — BRAIN NATRIURETIC PEPTIDE: Pro B Natriuretic peptide (BNP): 3 pg/mL (ref 0.0–100.0)

## 2021-08-17 MED ORDER — POLYETHYLENE GLYCOL 3350 17 GM/SCOOP PO POWD: 17.0000 g | Freq: Every day | ORAL | 0 refills | Status: AC | PRN

## 2021-08-17 MED ORDER — OXYCODONE-ACETAMINOPHEN 5-325 MG PO TABS
1.0000 | ORAL_TABLET | Freq: Four times a day (QID) | ORAL | 0 refills | Status: AC | PRN
Start: 2021-08-17 — End: 2021-08-22

## 2021-08-17 MED ORDER — FLUTICASONE PROPIONATE 50 MCG/ACT NA SUSP: 1.0000 | Freq: Every day | NASAL | 3 refills | Status: AC

## 2021-08-17 MED ORDER — LEVOCETIRIZINE DIHYDROCHLORIDE 5 MG PO TABS: 5.0000 mg | ORAL_TABLET | Freq: Every evening | ORAL | 3 refills | Status: DC

## 2021-08-17 NOTE — Progress Notes (Signed)
Subjective:    Patient ID: [name], female    DOB: August 10, 1951, 70 y.o.   MRN: [id number]  Chief Complaint  Patient presents with   Wheezing    Wheezing and cough.    Foot Swelling    Cellulitis in both feet   Mass    On left elbow. Feels painful when hits it wrong.    HPI Patient was seen today for ongoing concerns.  Pt seen for continued R foot pain and edema.  Pt having difficulty walking.  Completed doxycycline and Tramadol for pain which helped some.  Pt states she needs to be able to continue working and does not have time to rest.  Xray R foot 07/30/2021 with mild arthritis 1st MTP jt.  Labs were ordered during prior visit, but pt did not have them done.  Patient notes difficulty wearing shoes 2/2 edema.  Patient endorses falling at home.  Patient also with continued cough and noisy breathing.  Denies shortness of breath.  Past Medical History:  Diagnosis Date   Acute bronchitis    Allergy    Anxiety    Arthritis    knees   Asthma    Backache, unspecified    Cataract    removed both eyes   Depression    Diverticulitis    Diverticulosis of colon (without mention of hemorrhage)    Edema    Glaucoma    Lumbago    Obesity    Other chest pain    Pain in joint, ankle and foot    Sarcoidosis    Tibialis tendinitis    Unspecified asthma(493.90)     Allergies  Allergen Reactions   Ibuprofen     rash   Sulfa Antibiotics Rash    ROS General: Denies fever, chills, night sweats, changes in weight, changes in appetite HEENT: Denies headaches, ear pain, changes in vision, rhinorrhea, sore throat CV: Denies CP, palpitations, SOB, orthopnea + LE edema Pulm: Denies SOB + cough, wheezing GI: Denies abdominal pain, nausea, vomiting, diarrhea, constipation GU: Denies dysuria, hematuria, frequency, vaginal discharge Msk: Denies muscle cramps, joint pains  +R foot pain, b/l LE edema  Neuro: Denies weakness, numbness, tingling Skin: Denies rashes, bruising Psych:  Denies depression, anxiety, hallucinations     Objective:    Blood pressure 138/88, pulse (!) 102, temperature 98.3 F (36.8 C), temperature source Oral, SpO2 96 %.  Gen. Pleasant, well-nourished, in no distress, normal affect   HEENT: [place]/AT, face symmetric, conjunctiva clear, no scleral icterus, PERRLA, EOMI, nares patent without drainage Lungs: no accessory muscle use, CTAB, transmitted upper airway noises appreciated-resolved with having pt cough. Cardiovascular: RRR, no m/r/g, b/l LE edema R>L.  1+ pedal edema in R foot with trace inferior to R knee.  Trace in LLE to mid shin. Musculoskeletal: No increased warmth or erythema of b/l LEs.  RLE edema >L.  No TTP of calf, negative Homans' sign.  No deformities, no cyanosis or clubbing, normal tone Neuro:  A&Ox3, CN II-XII intact, gait not assessed as patient sitting in transport wheelchair Skin:  Warm, no lesions/ rash   Wt Readings from Last 3 Encounters:  07/30/21 220 lb (99.8 kg)  07/29/21 220 lb (99.8 kg)  05/26/21 222 lb 9.6 oz (101 kg)    Lab Results  Component Value Date   WBC 10.4 07/29/2021   HGB 11.6 (L) 07/29/2021   HCT 37.2 07/29/2021   PLT 379 07/29/2021   GLUCOSE 118 (H) 07/29/2021   CHOL 188 09/01/2020  TRIG 54 09/01/2020   HDL 64 09/01/2020   LDLCALC 110 (H) 09/01/2020   ALT 16 07/29/2021   AST 26 07/29/2021   NA 138 07/29/2021   K 3.4 (L) 07/29/2021   CL 103 07/29/2021   CREATININE 0.66 07/29/2021   BUN 12 07/29/2021   CO2 26 07/29/2021   TSH 1.14 09/01/2020   HGBA1C 5.6 09/01/2020    Assessment/Plan:  Right foot pain  -Discussed the importance of obtaining labs (uric acid, BNP, CBC with differential,etc).  Patient had drawn this visit. -X-ray right foot 07/30/2021 with mild first MTP joint arthritis. -Given patient's continued right foot pain discussed more detailed imaging such as MRI to rule out osteomyelitis or hairline fracture. -Will await on lab results for further recommendations such as  imaging or podiatry referral. -Given strict precautions - Plan: oxyCODONE-acetaminophen (PERCOCET/ROXICET) 5-325 MG tablet  Bilateral lower extremity edema -Likely dependent edema -Discussed the importance of elevating LEs and other supportive care -Given handout  Environmental and seasonal allergies  - Plan: fluticasone (FLONASE) 50 MCG/ACT nasal spray, levocetirizine (XYZAL ALLERGY 24HR) 5 MG tablet  F/u as needed  Grier Mitts, MD

## 2021-08-18 ENCOUNTER — Other Ambulatory Visit: Payer: Self-pay | Admitting: Family Medicine

## 2021-08-18 DIAGNOSIS — M19071 Primary osteoarthritis, right ankle and foot: Secondary | ICD-10-CM

## 2021-08-18 DIAGNOSIS — M79671 Pain in right foot: Secondary | ICD-10-CM

## 2021-08-18 DIAGNOSIS — E876 Hypokalemia: Secondary | ICD-10-CM

## 2021-08-18 DIAGNOSIS — R600 Localized edema: Secondary | ICD-10-CM

## 2021-08-18 MED ORDER — POTASSIUM CHLORIDE CRYS ER 20 MEQ PO TBCR: 20.0000 meq | EXTENDED_RELEASE_TABLET | Freq: Two times a day (BID) | ORAL | 0 refills | Status: DC

## 2021-08-22 ENCOUNTER — Other Ambulatory Visit: Payer: Self-pay | Admitting: Family Medicine

## 2021-08-22 DIAGNOSIS — R600 Localized edema: Secondary | ICD-10-CM

## 2021-08-23 ENCOUNTER — Observation Stay (HOSPITAL_COMMUNITY): Payer: Medicare PPO

## 2021-08-23 ENCOUNTER — Inpatient Hospital Stay (HOSPITAL_BASED_OUTPATIENT_CLINIC_OR_DEPARTMENT_OTHER)
Admission: EM | Admit: 2021-08-23 | Discharge: 2021-08-31 | DRG: 092 | Disposition: A | Discharge status: Skilled Nursing Facility | Payer: Medicare PPO | Attending: Internal Medicine | Admitting: Internal Medicine

## 2021-08-23 ENCOUNTER — Emergency Department (HOSPITAL_BASED_OUTPATIENT_CLINIC_OR_DEPARTMENT_OTHER): Payer: Medicare PPO

## 2021-08-23 ENCOUNTER — Encounter (HOSPITAL_BASED_OUTPATIENT_CLINIC_OR_DEPARTMENT_OTHER): Payer: Self-pay | Admitting: *Deleted

## 2021-08-23 ENCOUNTER — Other Ambulatory Visit: Payer: Self-pay

## 2021-08-23 DIAGNOSIS — R262 Difficulty in walking, not elsewhere classified: Secondary | ICD-10-CM | POA: Diagnosis present

## 2021-08-23 DIAGNOSIS — T402X5A Adverse effect of other opioids, initial encounter: Secondary | ICD-10-CM | POA: Diagnosis present

## 2021-08-23 DIAGNOSIS — R600 Localized edema: Secondary | ICD-10-CM | POA: Diagnosis present

## 2021-08-23 DIAGNOSIS — Z7982 Long term (current) use of aspirin: Secondary | ICD-10-CM

## 2021-08-23 DIAGNOSIS — M170 Bilateral primary osteoarthritis of knee: Secondary | ICD-10-CM | POA: Diagnosis present

## 2021-08-23 DIAGNOSIS — Z7401 Bed confinement status: Secondary | ICD-10-CM | POA: Diagnosis not present

## 2021-08-23 DIAGNOSIS — Z79899 Other long term (current) drug therapy: Secondary | ICD-10-CM | POA: Diagnosis not present

## 2021-08-23 DIAGNOSIS — G8929 Other chronic pain: Secondary | ICD-10-CM | POA: Diagnosis present

## 2021-08-23 DIAGNOSIS — F331 Major depressive disorder, recurrent, moderate: Secondary | ICD-10-CM | POA: Diagnosis not present

## 2021-08-23 DIAGNOSIS — Z8249 Family history of ischemic heart disease and other diseases of the circulatory system: Secondary | ICD-10-CM | POA: Diagnosis not present

## 2021-08-23 DIAGNOSIS — M25471 Effusion, right ankle: Secondary | ICD-10-CM | POA: Diagnosis not present

## 2021-08-23 DIAGNOSIS — G928 Other toxic encephalopathy: Principal | ICD-10-CM | POA: Diagnosis present

## 2021-08-23 DIAGNOSIS — D869 Sarcoidosis, unspecified: Secondary | ICD-10-CM | POA: Diagnosis present

## 2021-08-23 DIAGNOSIS — G9340 Encephalopathy, unspecified: Secondary | ICD-10-CM | POA: Diagnosis present

## 2021-08-23 DIAGNOSIS — Z6841 Body Mass Index (BMI) 40.0 and over, adult: Secondary | ICD-10-CM | POA: Diagnosis not present

## 2021-08-23 DIAGNOSIS — R6510 Systemic inflammatory response syndrome (SIRS) of non-infectious origin without acute organ dysfunction: Secondary | ICD-10-CM | POA: Diagnosis present

## 2021-08-23 DIAGNOSIS — L89312 Pressure ulcer of right buttock, stage 2: Secondary | ICD-10-CM | POA: Diagnosis present

## 2021-08-23 DIAGNOSIS — R410 Disorientation, unspecified: Secondary | ICD-10-CM | POA: Diagnosis not present

## 2021-08-23 DIAGNOSIS — M79672 Pain in left foot: Secondary | ICD-10-CM | POA: Diagnosis not present

## 2021-08-23 DIAGNOSIS — L89322 Pressure ulcer of left buttock, stage 2: Secondary | ICD-10-CM | POA: Diagnosis present

## 2021-08-23 DIAGNOSIS — L03115 Cellulitis of right lower limb: Secondary | ICD-10-CM | POA: Diagnosis present

## 2021-08-23 DIAGNOSIS — R4182 Altered mental status, unspecified: Secondary | ICD-10-CM | POA: Diagnosis not present

## 2021-08-23 DIAGNOSIS — E559 Vitamin D deficiency, unspecified: Secondary | ICD-10-CM | POA: Diagnosis present

## 2021-08-23 DIAGNOSIS — M79671 Pain in right foot: Secondary | ICD-10-CM | POA: Diagnosis not present

## 2021-08-23 DIAGNOSIS — M19071 Primary osteoarthritis, right ankle and foot: Secondary | ICD-10-CM | POA: Diagnosis not present

## 2021-08-23 DIAGNOSIS — R52 Pain, unspecified: Secondary | ICD-10-CM

## 2021-08-23 DIAGNOSIS — Z791 Long term (current) use of non-steroidal anti-inflammatories (NSAID): Secondary | ICD-10-CM

## 2021-08-23 DIAGNOSIS — Z79890 Hormone replacement therapy (postmenopausal): Secondary | ICD-10-CM | POA: Diagnosis not present

## 2021-08-23 DIAGNOSIS — F32A Depression, unspecified: Secondary | ICD-10-CM | POA: Diagnosis present

## 2021-08-23 DIAGNOSIS — M25511 Pain in right shoulder: Secondary | ICD-10-CM | POA: Diagnosis present

## 2021-08-23 DIAGNOSIS — J45909 Unspecified asthma, uncomplicated: Secondary | ICD-10-CM | POA: Diagnosis present

## 2021-08-23 DIAGNOSIS — M79673 Pain in unspecified foot: Secondary | ICD-10-CM | POA: Diagnosis present

## 2021-08-23 DIAGNOSIS — L8990 Pressure ulcer of unspecified site, unspecified stage: Secondary | ICD-10-CM | POA: Diagnosis present

## 2021-08-23 DIAGNOSIS — J45998 Other asthma: Secondary | ICD-10-CM | POA: Diagnosis not present

## 2021-08-23 DIAGNOSIS — M6281 Muscle weakness (generalized): Secondary | ICD-10-CM | POA: Diagnosis not present

## 2021-08-23 DIAGNOSIS — M1712 Unilateral primary osteoarthritis, left knee: Secondary | ICD-10-CM | POA: Diagnosis not present

## 2021-08-23 DIAGNOSIS — M7989 Other specified soft tissue disorders: Secondary | ICD-10-CM | POA: Diagnosis not present

## 2021-08-23 DIAGNOSIS — Z90710 Acquired absence of both cervix and uterus: Secondary | ICD-10-CM

## 2021-08-23 DIAGNOSIS — R440 Auditory hallucinations: Secondary | ICD-10-CM | POA: Diagnosis not present

## 2021-08-23 DIAGNOSIS — M25474 Effusion, right foot: Secondary | ICD-10-CM | POA: Diagnosis not present

## 2021-08-23 DIAGNOSIS — Z20822 Contact with and (suspected) exposure to covid-19: Secondary | ICD-10-CM | POA: Diagnosis present

## 2021-08-23 DIAGNOSIS — R9431 Abnormal electrocardiogram [ECG] [EKG]: Secondary | ICD-10-CM | POA: Diagnosis not present

## 2021-08-23 DIAGNOSIS — R601 Generalized edema: Secondary | ICD-10-CM | POA: Diagnosis not present

## 2021-08-23 DIAGNOSIS — G3184 Mild cognitive impairment, so stated: Secondary | ICD-10-CM | POA: Diagnosis not present

## 2021-08-23 DIAGNOSIS — M25461 Effusion, right knee: Secondary | ICD-10-CM | POA: Diagnosis not present

## 2021-08-23 DIAGNOSIS — E872 Acidosis, unspecified: Secondary | ICD-10-CM | POA: Diagnosis present

## 2021-08-23 DIAGNOSIS — M25569 Pain in unspecified knee: Secondary | ICD-10-CM | POA: Diagnosis present

## 2021-08-23 DIAGNOSIS — L0390 Cellulitis, unspecified: Secondary | ICD-10-CM | POA: Diagnosis not present

## 2021-08-23 DIAGNOSIS — R4189 Other symptoms and signs involving cognitive functions and awareness: Secondary | ICD-10-CM

## 2021-08-23 DIAGNOSIS — R609 Edema, unspecified: Secondary | ICD-10-CM | POA: Diagnosis not present

## 2021-08-23 DIAGNOSIS — Z833 Family history of diabetes mellitus: Secondary | ICD-10-CM

## 2021-08-23 DIAGNOSIS — J309 Allergic rhinitis, unspecified: Secondary | ICD-10-CM | POA: Diagnosis not present

## 2021-08-23 DIAGNOSIS — Z823 Family history of stroke: Secondary | ICD-10-CM

## 2021-08-23 DIAGNOSIS — M19072 Primary osteoarthritis, left ankle and foot: Secondary | ICD-10-CM | POA: Diagnosis not present

## 2021-08-23 DIAGNOSIS — W19XXXA Unspecified fall, initial encounter: Secondary | ICD-10-CM | POA: Diagnosis present

## 2021-08-23 LAB — RAPID URINE DRUG SCREEN, HOSP PERFORMED
Amphetamines: NOT DETECTED
Barbiturates: NOT DETECTED
Benzodiazepines: NOT DETECTED
Cocaine: NOT DETECTED
Opiates: NOT DETECTED
Tetrahydrocannabinol: NOT DETECTED

## 2021-08-23 LAB — CK: Total CK: 1460 U/L — ABNORMAL HIGH (ref 38–234)

## 2021-08-23 LAB — CBC WITH DIFFERENTIAL/PLATELET
Abs Immature Granulocytes: 0.04 10*3/uL (ref 0.00–0.07)
Basophils Absolute: 0 10*3/uL (ref 0.0–0.1)
Basophils Relative: 0 %
Eosinophils Absolute: 0.6 10*3/uL — ABNORMAL HIGH (ref 0.0–0.5)
Eosinophils Relative: 7 %
HCT: 32.1 % — ABNORMAL LOW (ref 36.0–46.0)
Hemoglobin: 9.9 g/dL — ABNORMAL LOW (ref 12.0–15.0)
Immature Granulocytes: 0 %
Lymphocytes Relative: 20 %
Lymphs Abs: 1.8 10*3/uL (ref 0.7–4.0)
MCH: 28.1 pg (ref 26.0–34.0)
MCHC: 30.8 g/dL (ref 30.0–36.0)
MCV: 91.2 fL (ref 80.0–100.0)
Monocytes Absolute: 0.7 10*3/uL (ref 0.1–1.0)
Monocytes Relative: 8 %
Neutro Abs: 5.9 10*3/uL (ref 1.7–7.7)
Neutrophils Relative %: 65 %
Platelets: 485 10*3/uL — ABNORMAL HIGH (ref 150–400)
RBC: 3.52 MIL/uL — ABNORMAL LOW (ref 3.87–5.11)
RDW: 15.4 % (ref 11.5–15.5)
WBC: 9.1 10*3/uL (ref 4.0–10.5)
nRBC: 0 % (ref 0.0–0.2)

## 2021-08-23 LAB — URINALYSIS, ROUTINE W REFLEX MICROSCOPIC
Bilirubin Urine: NEGATIVE
Glucose, UA: NEGATIVE mg/dL
Hgb urine dipstick: NEGATIVE
Ketones, ur: NEGATIVE mg/dL
Nitrite: NEGATIVE
Specific Gravity, Urine: 1.023 (ref 1.005–1.030)
pH: 5.5 (ref 5.0–8.0)

## 2021-08-23 LAB — COMPREHENSIVE METABOLIC PANEL
ALT: 30 U/L (ref 0–44)
AST: 59 U/L — ABNORMAL HIGH (ref 15–41)
Albumin: 3.3 g/dL — ABNORMAL LOW (ref 3.5–5.0)
Alkaline Phosphatase: 71 U/L (ref 38–126)
Anion gap: 9 (ref 5–15)
BUN: 15 mg/dL (ref 8–23)
CO2: 26 mmol/L (ref 22–32)
Calcium: 9.3 mg/dL (ref 8.9–10.3)
Chloride: 104 mmol/L (ref 98–111)
Creatinine, Ser: 0.56 mg/dL (ref 0.44–1.00)
GFR, Estimated: >60 mL/min (ref >60)
Glucose, Bld: 129 mg/dL — ABNORMAL HIGH (ref 70–99)
Potassium: 4.2 mmol/L (ref 3.5–5.1)
Sodium: 139 mmol/L (ref 135–145)
Total Bilirubin: 0.2 mg/dL — ABNORMAL LOW (ref 0.3–1.2)
Total Protein: 6.9 g/dL (ref 6.5–8.1)

## 2021-08-23 LAB — BRAIN NATRIURETIC PEPTIDE: B Natriuretic Peptide: 13 pg/mL (ref 0.0–100.0)

## 2021-08-23 LAB — C-REACTIVE PROTEIN: CRP: 14.5 mg/dL — ABNORMAL HIGH (ref <1.0)

## 2021-08-23 LAB — RESP PANEL BY RT-PCR (FLU A&B, COVID) ARPGX2
Influenza A by PCR: NEGATIVE
Influenza B by PCR: NEGATIVE
SARS Coronavirus 2 by RT PCR: NEGATIVE

## 2021-08-23 LAB — BLOOD GAS, VENOUS
Acid-Base Excess: 3.7 mmol/L — ABNORMAL HIGH (ref 0.0–2.0)
Bicarbonate: 27.3 mmol/L (ref 20.0–28.0)
O2 Saturation: 94.5 %
Patient temperature: 98.6
pCO2, Ven: 38.8 mmHg — ABNORMAL LOW (ref 44.0–60.0)
pH, Ven: 7.461 — ABNORMAL HIGH (ref 7.250–7.430)
pO2, Ven: 75.1 mmHg — ABNORMAL HIGH (ref 32.0–45.0)

## 2021-08-23 LAB — AMMONIA: Ammonia: 20 umol/L (ref 9–35)

## 2021-08-23 LAB — ETHANOL: Alcohol, Ethyl (B): <10 mg/dL (ref <10)

## 2021-08-23 LAB — SEDIMENTATION RATE: Sed Rate: 85 mm/hr — ABNORMAL HIGH (ref 0–22)

## 2021-08-23 MED ORDER — ACETAMINOPHEN 650 MG RE SUPP: 650.0000 mg | Freq: Four times a day (QID) | RECTAL | Status: DC | PRN

## 2021-08-23 MED ORDER — ESTRADIOL 1 MG PO TABS
1.0000 mg | ORAL_TABLET | Freq: Every day | ORAL | Status: DC
  Administered 2021-08-23 – 2021-08-31 (×9): 1 mg via ORAL
  Filled 2021-08-23 (×9): qty 1

## 2021-08-23 MED ORDER — ENOXAPARIN SODIUM 40 MG/0.4ML IJ SOSY
40.0000 mg | PREFILLED_SYRINGE | INTRAMUSCULAR | Status: DC
  Administered 2021-08-23 – 2021-08-30 (×8): 40 mg via SUBCUTANEOUS
  Filled 2021-08-23 (×8): qty 0.4

## 2021-08-23 MED ORDER — MOMETASONE FURO-FORMOTEROL FUM 200-5 MCG/ACT IN AERO
2.0000 | INHALATION_SPRAY | Freq: Two times a day (BID) | RESPIRATORY_TRACT | Status: DC
  Administered 2021-08-23 – 2021-08-30 (×14): 2 via RESPIRATORY_TRACT
  Filled 2021-08-23: qty 8.8

## 2021-08-23 MED ORDER — FUROSEMIDE 10 MG/ML IJ SOLN
40.0000 mg | Freq: Once | INTRAMUSCULAR | Status: AC
  Administered 2021-08-23: 40 mg via INTRAVENOUS
  Filled 2021-08-23: qty 4

## 2021-08-23 MED ORDER — CEFAZOLIN SODIUM-DEXTROSE 2-4 GM/100ML-% IV SOLN
2.0000 g | Freq: Three times a day (TID) | INTRAVENOUS | Status: DC
  Filled 2021-08-23 (×2): qty 100

## 2021-08-23 MED ORDER — CYCLOSPORINE 0.05 % OP EMUL
1.0000 [drp] | Freq: Two times a day (BID) | OPHTHALMIC | Status: DC
  Administered 2021-08-23 – 2021-08-31 (×16): 1 [drp] via OPHTHALMIC
  Filled 2021-08-23 (×16): qty 30

## 2021-08-23 MED ORDER — ACETAMINOPHEN 325 MG PO TABS
650.0000 mg | ORAL_TABLET | Freq: Four times a day (QID) | ORAL | Status: DC | PRN
  Administered 2021-08-23 – 2021-08-31 (×15): 650 mg via ORAL
  Filled 2021-08-23 (×15): qty 2

## 2021-08-23 MED ORDER — LORATADINE 10 MG PO TABS
5.0000 mg | ORAL_TABLET | Freq: Every evening | ORAL | Status: DC
  Administered 2021-08-23 – 2021-08-30 (×8): 5 mg via ORAL
  Filled 2021-08-23 (×7): qty 1

## 2021-08-23 MED ORDER — FLUOXETINE HCL 10 MG PO CAPS
10.0000 mg | ORAL_CAPSULE | Freq: Every day | ORAL | Status: DC
  Administered 2021-08-23 – 2021-08-27 (×5): 10 mg via ORAL
  Filled 2021-08-23 (×6): qty 1

## 2021-08-23 MED ORDER — FLUTICASONE PROPIONATE 50 MCG/ACT NA SUSP
1.0000 | Freq: Every day | NASAL | Status: DC
  Administered 2021-08-23 – 2021-08-30 (×8): 1 via NASAL
  Filled 2021-08-23: qty 16

## 2021-08-23 MED ORDER — CEFAZOLIN SODIUM-DEXTROSE 2-4 GM/100ML-% IV SOLN
2.0000 g | Freq: Three times a day (TID) | INTRAVENOUS | Status: AC
  Administered 2021-08-24 – 2021-08-30 (×21): 2 g via INTRAVENOUS
  Filled 2021-08-23 (×21): qty 100

## 2021-08-23 MED ORDER — DICLOFENAC SODIUM 1 % EX GEL
2.0000 g | Freq: Four times a day (QID) | CUTANEOUS | Status: DC
  Administered 2021-08-24 – 2021-08-31 (×29): 2 g via TOPICAL
  Filled 2021-08-23: qty 100

## 2021-08-23 MED ORDER — OXYCODONE HCL 5 MG PO TABS
5.0000 mg | ORAL_TABLET | ORAL | Status: DC | PRN
  Administered 2021-08-24 – 2021-08-31 (×19): 5 mg via ORAL
  Filled 2021-08-23 (×19): qty 1

## 2021-08-23 NOTE — Assessment & Plan Note (Signed)
estradiol 

## 2021-08-23 NOTE — Assessment & Plan Note (Addendum)
Fever several hours after admission with tachycardia and bilateral LE erythema concerning for cellulitis Blood cultures CXR without evidence of acute disease Follow MRI R foot and plain films Follow on ancef Hold IVF with concern for edema

## 2021-08-23 NOTE — Assessment & Plan Note (Signed)
Sounds like this was due to pain meds She's Lakela Kuba&O at this time Delirium precautions

## 2021-08-23 NOTE — H&P (Signed)
History and Physical    [name] XIP:[id number] DOB: 11/22/1950 DOA: 08/23/2021  PCP: [name], MD   Chief Complaint: difficulty walking  HPI: [name] is [name] 70 y.o. female with medical history significant of asthma, arthritis, sarcodisosis, depression, and other medical problems presenting with difficulty ambulating, bilateral LE pain and swelling.  Symptoms have been going on for about 1 month.  Has been on doxycycline and ?clindamycin for symptoms.  Symptoms started several weeks ago with pain that started in her right foot.  She noted pain gradually spread to her left foot.  She notes chronic bilateral knee pain.  Over the period of time she's been having difficulty walking, she's had to crawl on the floor because of the pain.  She told the EDP, "she cannot walk because the swelling makes her feet numb".  She notes right shoulder pain from scooting on the floor.  Previous to her developing pain and difficulty walking, she used furniture to get around the house (did not use cane or walker).  She notes bilateral LE swelling.  Drinks occasionally.    Review of Systems: ROS as per HPI   As per HPI otherwise 10 point review of systems negative.   Allergies  Allergen Reactions   Ibuprofen     rash   Sulfa Antibiotics Rash    Past Medical History:  Diagnosis Date   Acute bronchitis    Allergy    Anxiety    Arthritis    knees   Asthma    Backache, unspecified    Cataract    removed both eyes   Depression    Diverticulitis    Diverticulosis of colon (without mention of hemorrhage)    Edema    Glaucoma    Lumbago    Obesity    Other chest pain    Pain in joint, ankle and foot    Sarcoidosis    Tibialis tendinitis    Unspecified asthma(493.90)     Past Surgical History:  Procedure Laterality Date   ABDOMINAL HYSTERECTOMY     CATARACT EXTRACTION W/PHACO Right 06/24/2014   Procedure: CATARACT EXTRACTION PHACO AND INTRAOCULAR LENS PLACEMENT ([place])  RIGHT EYE WITH GONIOSYNECHIALYSIS;  Surgeon: Marylynn Pearson, MD;  Location: [name];  Service: Ophthalmology;  Laterality: Right;   COLONOSCOPY     EYE SURGERY Left    cataract surgery   HAND SURGERY     IR RADIOLOGIST EVAL & MGMT  11/14/2018   KNEE ARTHROSCOPY Right      reports that she has never smoked. She has never used smokeless tobacco. She reports current alcohol use of about 1.0 standard drink per week. She reports that she does not use drugs.  Family History  Problem Relation Age of Onset   Hypertension Mother    Stroke Mother    Diabetes Father    Sarcoidosis Sister    Colon cancer Neg Hx    Colon polyps Neg Hx    Esophageal cancer Neg Hx    Rectal cancer Neg Hx    Stomach cancer Neg Hx     Prior to Admission medications   Medication Sig Start Date End Date Taking? Authorizing Provider  albuterol (VENTOLIN HFA) 108 (90 Base) MCG/ACT inhaler TAKE 2 PUFFS BY MOUTH EVERY 6 HOURS AS NEEDED Patient taking differently: Inhale 2 puffs into the lungs every 6 (six) hours as needed for shortness of breath or wheezing. 11/26/20  Yes [name], MD  cycloSPORINE (RESTASIS) 0.05 % ophthalmic  emulsion Restasis 0.05 % eye drops in [name]   Yes [provider]  estradiol (ESTRACE) 1 MG tablet Take 1 tablet (1 mg total) by mouth daily. 06/11/12  Yes Marletta Lor, MD  FLUoxetine (PROZAC) 10 MG capsule Take 10 mg by mouth daily.   Yes [provider]  furosemide (LASIX) 40 MG tablet TAKE 0.5 TABLETS (20 MG TOTAL) BY MOUTH AS NEEDED (SWELLING). 08/22/21  Yes [name], MD  Ginkgo Biloba (GINKOBA PO) Take 1 capsule by mouth daily.   Yes [provider]  levocetirizine (XYZAL ALLERGY 24HR) 5 MG tablet Take 1 tablet (5 mg total) by mouth every evening. 08/17/21  Yes [name], MD  meloxicam (MOBIC) 15 MG tablet Take 1 tablet (15 mg total) by mouth daily. 07/17/21  Yes Magnant, Gerrianne Scale, PA-C  Multiple Vitamins-Minerals (MULTIVITAMIN WITH  MINERALS) tablet Take 1 tablet by mouth daily.   Yes [provider]  oxyCODONE-acetaminophen (PERCOCET/ROXICET) 5-325 MG tablet Take by mouth every 6 (six) hours as needed for severe pain.   Yes [provider]  polyethylene glycol powder (GLYCOLAX/MIRALAX) 17 GM/SCOOP powder Take 17 g by mouth daily as needed. 08/17/21  Yes [name], MD  potassium chloride SA (KLOR-CON) 20 MEQ tablet Take 1 tablet (20 mEq total) by mouth 2 (two) times daily for 3 days. 08/18/21 08/23/21 Yes Banks, Langley Adie, MD  WIXELA INHUB 250-50 MCG/ACT AEPB INHALE 1 PUFF BY MOUTH TWICE [facility] DAY Patient taking differently: Inhale 1 puff into the lungs in the morning and at bedtime. 06/07/21  Yes [name], MD  fluticasone (FLONASE) 50 MCG/ACT nasal spray Place 1 spray into both nostrils daily. 08/17/21   [name], MD  predniSONE (DELTASONE) 10 MG tablet Take 6 tabs every morning for 3 days, 5 tabs for 3 days, 4 tabs for 2 days, 3 tabs for 2 days, 2 tabs for 2 days, 1 tab for 2 days. Patient not taking: Reported on 08/23/2021 05/26/21   [name], MD  Vitamin D, Ergocalciferol, (DRISDOL) 1.25 MG (50000 UNIT) CAPS capsule Take 1 capsule (50,000 Units total) by mouth every 7 (seven) days. 05/26/21   [name], MD    Physical Exam: Vitals:   08/23/21 1541 08/23/21 1631 08/23/21 1948 08/23/21 2050  BP: (!) 152/75 121/65  (!) 138/54  Pulse: (!) 110 (!) 116  (!) 114  Resp: 17 20  18   Temp: 98.3 F (36.8 C) 98.8 F (37.1 C)  (!) 100.4 F (38 C)  TempSrc: Oral Oral  Oral  SpO2: 97% 100%  96%  Weight:   106 kg   Height:   5\' 2"  (1.575 m)    Physical Exam Constitutional:      Appearance: Normal appearance. She is obese. She is not ill-appearing or toxic-appearing.  HENT:     Head: Normocephalic and atraumatic.  Cardiovascular:     Rate and Rhythm: Normal rate and regular rhythm.  Pulmonary:     Effort: Pulmonary effort is normal.     Breath sounds: Normal breath sounds.   Abdominal:     General: Abdomen is flat. There is no distension.     Palpations: There is no mass.  Musculoskeletal:     Cervical back: Normal range of motion and neck supple.     Comments: Mild R knee pain to palpation  Skin:    Findings: Erythema (noted to bilateral LE) present.     Comments: Bilateral feet with erythema on  dorsum of feet, but not particularly tender to palpation, doesn't seem exquisitely tender like I think of with gout  Neurological:     General: No focal deficit present.     Mental Status: She is alert and oriented to person, place, and time.     Comments: Moves all extremities, symmetric strength, some limitations due to pain  Psychiatric:        Mood and Affect: Mood normal.        Behavior: Behavior normal.    EKG with sinus rhythm   Labs on Admission: I have personally reviewed the patients's labs and imaging studies.  Assessment/Plan Principal Problem:   Acute encephalopathy Active Problems:   Fall   Foot pain   SIRS (systemic inflammatory response syndrome) (HCC)   Knee pain   Difficulty in walking   Bilateral lower extremity edema   SARCOIDOSIS   Hormone replacement therapy   * Acute encephalopathy Sounds like this was due to pain meds She's [name]&O at this time Delirium precautions  SIRS (systemic inflammatory response syndrome) (HCC) Fever several hours after admission with tachycardia and bilateral LE erythema concerning for cellulitis Blood cultures CXR without evidence of acute disease Follow MRI R foot and plain films Follow on ancef Hold IVF with concern for edema  Foot pain Started in R foot, then left Afebrile, no leukocytosis -> addendum, developed fever and tachy, will start abx Will hold further abx right now Plain films of bilateral feet, outpatient provider was planning for MRI of R foot, will get this here  Difficulty in walking Seems like this is due to pain.  No numbness or neurologic deficits noted on exam. Has  severe OA in knees, but precipitating pain was in bilateral feet starting in right, then left) Exam with redness, but not too impressive from pain standpoint Follow inflammatory markers PT/OT  Knee pain Severe OA noted in 03/2021 images Repeat plain films  Bilateral lower extremity edema Follow echo Trace protein on UA, follow LE [name] Consider continued diuresis  SARCOIDOSIS She notes this is not active?   Hormone replacement therapy estradiol  Admission status: Observation Telemetry  Certification: The appropriate patient status for this patient is OBSERVATION. Observation status is judged to be reasonable and necessary in order to provide the required intensity of service to ensure the patient's safety. The patient's presenting symptoms, physical exam findings, and initial radiographic and laboratory data in the context of their medical condition is felt to place them at decreased risk for further clinical deterioration. Furthermore, it is anticipated that the patient will be medically stable for discharge from the hospital within 2 midnights of admission.     Fayrene Helper MD [facility] If 7PM-7AM, please contact night-coverage [email or web address]  08/23/2021, 11:23 PM

## 2021-08-23 NOTE — Assessment & Plan Note (Signed)
She notes this is not active?

## 2021-08-23 NOTE — ED Notes (Addendum)
Care handoff given to [place] at 4E at [facility]

## 2021-08-23 NOTE — ED Triage Notes (Signed)
Pt arrives via [facility] from home with c/o bilateral leg swelling and feet pain. About 4 weeks ago, pt had cellulitis and was treated with antibiotics. Did get somewhat better, but now having difficulty getting around again. En route vitals 140/80, hr 100, saturations 96%.

## 2021-08-23 NOTE — Assessment & Plan Note (Signed)
Severe OA noted in 03/2021 images Repeat plain films

## 2021-08-23 NOTE — ED Notes (Signed)
Patient states took home pain medication one Percocet

## 2021-08-23 NOTE — Assessment & Plan Note (Signed)
Follow echo Trace protein on UA, follow LE [name] Consider continued diuresis

## 2021-08-23 NOTE — ED Provider Notes (Addendum)
[facility] EMERGENCY DEPT Provider Note   CSN: [id number] Arrival date & time: 08/23/21  0973     History Chief Complaint  Patient presents with   Leg Swelling    [name] is a 70 y.o. female.  HPI Patient presents with swelling in her legs.  Has had for around a month.  Has been seen in the ER and by PCP a couple times.  Has been on antibiotics.  Has been on doxycycline and then what sounds like clindamycin.  States that there has been more swelling in the feet.  States there is redness.  Initially was in right foot and now include both feet.  No shortness of breath.  States she cannot walk because the swelling makes her feet numb.  States she has had to crawl around the house.  No difficulty breathing.  No fevers.  Has edema of the legs.  States that has improved.  Got seen by PCP earlier this week but there is no note in.    Past Medical History:  Diagnosis Date   Acute bronchitis    Allergy    Anxiety    Arthritis    knees   Asthma    Backache, unspecified    Cataract    removed both eyes   Depression    Diverticulitis    Diverticulosis of colon (without mention of hemorrhage)    Edema    Glaucoma    Lumbago    Obesity    Other chest pain    Pain in joint, ankle and foot    Sarcoidosis    Tibialis tendinitis    Unspecified asthma(493.90)     Patient Active Problem List   Diagnosis Date Noted   Osteoporosis 01/28/2021   Osteoarthritis of right wrist 01/28/2021   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   Pain due to onychomycosis of toenails of both feet 09/13/2020   Chronic pain of right knee 08/24/2018   Fall in home 07/14/2015   TIBIALIS TENDINITIS 08/25/2009   CHEST PAIN, OTHER, PAIN 10/21/2008   PEDAL EDEMA 07/22/2008   ANKLE PAIN, BILATERAL 04/01/2008   LOW BACK PAIN 04/01/2008   ACUTE BRONCHITIS 09/23/2007   SARCOIDOSIS 09/02/2007   Asthma 09/02/2007   Diverticulosis of colon 07/11/2007   BACK PAIN, CHRONIC 07/11/2007    Past  Surgical History:  Procedure Laterality Date   ABDOMINAL HYSTERECTOMY     CATARACT EXTRACTION W/PHACO Right 06/24/2014   Procedure: CATARACT EXTRACTION PHACO AND INTRAOCULAR LENS PLACEMENT ([place]) RIGHT EYE WITH GONIOSYNECHIALYSIS;  Surgeon: Marylynn Pearson, MD;  Location: [place];  Service: Ophthalmology;  Laterality: Right;   COLONOSCOPY     EYE SURGERY Left    cataract surgery   HAND SURGERY     IR RADIOLOGIST EVAL & MGMT  11/14/2018   KNEE ARTHROSCOPY Right      OB History   No obstetric history on file.     Family History  Problem Relation Age of Onset   Hypertension Mother    Stroke Mother    Diabetes Father    Sarcoidosis Sister    Colon cancer Neg Hx    Colon polyps Neg Hx    Esophageal cancer Neg Hx    Rectal cancer Neg Hx    Stomach cancer Neg Hx     Social History   Tobacco Use   Smoking status: Never   Smokeless tobacco: Never  Substance Use Topics   Alcohol use: Yes    Alcohol/week: 1.0 standard drink  Types: 1 Standard drinks or equivalent per week    Comment: social   Drug use: No    Home Medications Prior to Admission medications   Medication Sig Start Date End Date Taking? Authorizing Provider  albuterol (VENTOLIN HFA) 108 (90 Base) MCG/ACT inhaler TAKE 2 PUFFS BY MOUTH EVERY 6 HOURS AS NEEDED Patient taking differently: Inhale 2 puffs into the lungs every 6 (six) hours as needed for shortness of breath or wheezing. 11/26/20   [name], MD  cycloSPORINE (RESTASIS) 0.05 % ophthalmic emulsion Restasis 0.05 % eye drops in a dropperette Patient not taking: No sig reported    [provider]  doxycycline (VIBRAMYCIN) 100 MG capsule Take 1 capsule (100 mg total) by mouth 2 (two) times daily. 07/30/21   Evlyn Courier, PA-C  estradiol (ESTRACE) 1 MG tablet Take 1 tablet (1 mg total) by mouth daily. 06/11/12   Marletta Lor, MD  FLUoxetine (PROZAC) 10 MG capsule Take 10 mg by mouth daily.    [provider]  fluticasone (FLONASE) 50  MCG/ACT nasal spray Place 1 spray into both nostrils daily. 08/17/21   [name], MD  furosemide (LASIX) 40 MG tablet TAKE 0.5 TABLETS (20 MG TOTAL) BY MOUTH AS NEEDED (SWELLING). 08/22/21   [name], MD  Ginkgo Biloba (GINKOBA PO) Take 1 capsule by mouth daily.    [provider]  levocetirizine (XYZAL ALLERGY 24HR) 5 MG tablet Take 1 tablet (5 mg total) by mouth every evening. 08/17/21   [name], MD  meloxicam (MOBIC) 15 MG tablet Take 1 tablet (15 mg total) by mouth daily. 07/17/21   Magnant, Gerrianne Scale, PA-C  Multiple Vitamins-Minerals (MULTIVITAMIN WITH MINERALS) tablet Take 1 tablet by mouth daily.    [provider]  polyethylene glycol powder (GLYCOLAX/MIRALAX) 17 GM/SCOOP powder Take 17 g by mouth daily as needed. 08/17/21   [name], MD  potassium chloride SA (KLOR-CON) 20 MEQ tablet Take 1 tablet (20 mEq total) by mouth 2 (two) times daily for 3 days. 08/18/21 08/21/21  [name], MD  predniSONE (DELTASONE) 10 MG tablet Take 6 tabs every morning for 3 days, 5 tabs for 3 days, 4 tabs for 2 days, 3 tabs for 2 days, 2 tabs for 2 days, 1 tab for 2 days. 05/26/21   [name], MD  Vitamin D, Ergocalciferol, (DRISDOL) 1.25 MG (50000 UNIT) CAPS capsule Take 1 capsule (50,000 Units total) by mouth every 7 (seven) days. 05/26/21   [name], MD  WIXELA INHUB 250-50 MCG/ACT AEPB INHALE 1 PUFF BY MOUTH TWICE A DAY Patient taking differently: Inhale 1 puff into the lungs in the morning and at bedtime. 06/07/21   [name], MD  ZIOPTAN 0.0015 % SOLN Place 1 drop into both eyes at bedtime. Patient not taking: No sig reported 10/15/18   [provider]    Allergies    Ibuprofen and Sulfa antibiotics  Review of Systems   Review of Systems  Constitutional:  Negative for appetite change and fever.  HENT:  Negative for congestion.   Respiratory:  Negative for shortness of breath.   Cardiovascular:  Positive for leg  swelling.  Gastrointestinal:  Negative for abdominal pain.  Genitourinary:  Negative for flank pain.  Musculoskeletal:  Negative for back pain.  Skin:  Negative for rash.  Neurological:  Positive for numbness.  Psychiatric/Behavioral:  Negative for confusion.    Physical Exam Updated Vital Signs BP 132/73 (BP Location: Right  Arm)   Pulse (!) 106   Temp 98.5 F (36.9 C)   Resp 20   SpO2 97%   Physical Exam Vitals and nursing note reviewed.  HENT:     Head: Normocephalic.  Eyes:     Pupils: Pupils are equal, round, and reactive to light.  Cardiovascular:     Rate and Rhythm: Regular rhythm.  Abdominal:     Tenderness: There is no abdominal tenderness.  Musculoskeletal:     Cervical back: Neck supple.     Right lower leg: Edema present.     Left lower leg: Edema present.     Comments: Moderate pitting edema bilateral lower extremities.  Also severe on the feet.  Mild erythema at the forefoot but no induration or warmth.  Skin:    Capillary Refill: Capillary refill takes less than 2 seconds.  Neurological:     Mental Status: She is alert and oriented to person, place, and time.    ED Results / Procedures / Treatments   Labs (all labs ordered are listed, but only abnormal results are displayed) Labs Reviewed  COMPREHENSIVE METABOLIC PANEL - Abnormal; Notable for the following components:      Result Value   Glucose, Bld 129 (*)    Albumin 3.3 (*)    AST 59 (*)    Total Bilirubin 0.2 (*)    All other components within normal limits  CBC WITH DIFFERENTIAL/PLATELET - Abnormal; Notable for the following components:   RBC 3.52 (*)    Hemoglobin 9.9 (*)    HCT 32.1 (*)    Platelets 485 (*)    Eosinophils Absolute 0.6 (*)    All other components within normal limits  BRAIN NATRIURETIC PEPTIDE  ETHANOL  RAPID URINE DRUG SCREEN, HOSP PERFORMED  URINALYSIS, ROUTINE W REFLEX MICROSCOPIC    EKG EKG Interpretation  Date/Time:  Tuesday August 23 2021 07:21:41  EST Ventricular Rate:  97 PR Interval:  160 QRS Duration: 96 QT Interval:  335 QTC Calculation: 426 R Axis:   21 Text Interpretation: Sinus rhythm Confirmed by Davonna Belling [phone]) on 08/23/2021 8:18:06 AM  Radiology CT Head Wo Contrast  Result Date: 08/23/2021 CLINICAL DATA:  Mental status changes in a 70 year old female. EXAM: CT HEAD WITHOUT CONTRAST TECHNIQUE: Contiguous axial images were obtained from the base of the skull through the vertex without intravenous contrast. COMPARISON:  MRI of the brain of January 08, 2021. FINDINGS: Brain: No evidence of acute infarction, hemorrhage, hydrocephalus, extra-axial collection or mass lesion/mass effect. Signs of atrophy and chronic microvascular ischemic change similar accounting for comparison across modalities when compared to April of 2022. Vascular: No hyperdense vessel or unexpected calcification. Skull: Normal. Negative for fracture or focal lesion. Sinuses/Orbits: Visualized paranasal sinuses and orbits are unremarkable. Other: None. IMPRESSION: No acute intracranial pathology. Signs of atrophy and chronic microvascular ischemic change similar accounting for comparison across modalities when compared to April of 2022. Electronically Signed   By: Zetta Bills M.D.   On: 08/23/2021 08:55   DG Chest Portable 1 View  Result Date: 08/23/2021 CLINICAL DATA:  70 year old female with history of bilateral leg swelling and feet pain. EXAM: PORTABLE CHEST 1 VIEW COMPARISON:  Chest x-ray 08/03/2021. FINDINGS: Lung volumes are low. No consolidative airspace disease. No pleural effusions. No pneumothorax. No pulmonary nodule or mass noted. Pulmonary vasculature and the cardiomediastinal silhouette are within normal limits. IMPRESSION: 1. Low lung volumes without radiographic evidence of acute cardiopulmonary disease. Electronically Signed   By: Mauri Brooklyn.D.  On: 08/23/2021 07:29    Procedures Procedures   Medications Ordered in  ED Medications - No data to display  ED Course  I have reviewed the triage vital signs and the nursing notes.  Pertinent labs & imaging results that were available during my care of the patient were reviewed by me and considered in my medical decision making (see chart for details).    MDM Rules/Calculators/A&P                           Patient brought in for leg swelling.  Has been treated for cellulitis both by doxycycline and by what sounds like clindamycin.  Has seen PCP and has been given pain medicine looks like it was tramadol and then followed by oxycodone.  States pain is still bad in the feet.  States she is concerned the infection got worse.  Recently saw PCP.  Unable to see PCP note but it appears as if she was started on Lasix which has not been filled yet and also started on oxycodone.  Patient is somewhat somnolent in the ER.  Will awaken answer questions but even during speech will fall back asleep.  Potentially could be medicine related.  Chest x-ray reassuring.  Normal BNP.  Has moderate edema of the legs with normal white count.  However there is mild erythema without induration.  No warmth of the feet.  Not convinced this is a cellulitis.  With mental status change I feels the patient would benefit from admission to the hospital.  Will discuss with hospitalist  Discussed with patient's nephew who called.  Reportedly patient has been doing worse at home.  Had been doing worse since her husband died.  Has delusions.  Apparently is living in "hoarder" situation.  They were unable to get her up today when they were trying to move her and EMS was called.  Reportedly has active delusions also Final Clinical Impression(s) / ED Diagnoses Final diagnoses:  Peripheral edema  Encephalopathy    Rx / DC Orders ED Discharge Orders     None        Davonna Belling, MD 08/23/21 1041    Davonna Belling, MD 08/23/21 1106

## 2021-08-23 NOTE — Plan of Care (Signed)
°  Problem: Coping: °Goal: Level of anxiety will decrease °Outcome: Progressing °  °

## 2021-08-23 NOTE — ED Notes (Signed)
[facility] at bedside 

## 2021-08-23 NOTE — Assessment & Plan Note (Addendum)
Seems like this is due to pain.  No numbness or neurologic deficits noted on exam. Has severe OA in knees, but precipitating pain was in bilateral feet starting in right, then left) Exam with redness, but not too impressive from pain standpoint Follow inflammatory markers PT/OT

## 2021-08-23 NOTE — ED Notes (Signed)
Handoff report given to [facility] 

## 2021-08-23 NOTE — ED Notes (Signed)
Attempt to give report.  Floor RN to call back

## 2021-08-23 NOTE — Assessment & Plan Note (Addendum)
Started in R foot, then left Afebrile, no leukocytosis -> addendum, developed fever and tachy, will start abx Will hold further abx right now Plain films of bilateral feet, outpatient provider was planning for MRI of R foot, will get this here

## 2021-08-24 ENCOUNTER — Observation Stay (HOSPITAL_COMMUNITY): Payer: Medicare PPO

## 2021-08-24 DIAGNOSIS — M19072 Primary osteoarthritis, left ankle and foot: Secondary | ICD-10-CM | POA: Diagnosis not present

## 2021-08-24 DIAGNOSIS — M25461 Effusion, right knee: Secondary | ICD-10-CM | POA: Diagnosis not present

## 2021-08-24 DIAGNOSIS — D869 Sarcoidosis, unspecified: Secondary | ICD-10-CM | POA: Diagnosis present

## 2021-08-24 DIAGNOSIS — R601 Generalized edema: Secondary | ICD-10-CM | POA: Diagnosis not present

## 2021-08-24 DIAGNOSIS — M25471 Effusion, right ankle: Secondary | ICD-10-CM | POA: Diagnosis not present

## 2021-08-24 DIAGNOSIS — M7989 Other specified soft tissue disorders: Secondary | ICD-10-CM | POA: Diagnosis not present

## 2021-08-24 DIAGNOSIS — J45909 Unspecified asthma, uncomplicated: Secondary | ICD-10-CM | POA: Diagnosis present

## 2021-08-24 DIAGNOSIS — Z79890 Hormone replacement therapy (postmenopausal): Secondary | ICD-10-CM | POA: Diagnosis not present

## 2021-08-24 DIAGNOSIS — Z20822 Contact with and (suspected) exposure to covid-19: Secondary | ICD-10-CM | POA: Diagnosis present

## 2021-08-24 DIAGNOSIS — M1712 Unilateral primary osteoarthritis, left knee: Secondary | ICD-10-CM | POA: Diagnosis not present

## 2021-08-24 DIAGNOSIS — R609 Edema, unspecified: Secondary | ICD-10-CM | POA: Diagnosis not present

## 2021-08-24 DIAGNOSIS — L03115 Cellulitis of right lower limb: Secondary | ICD-10-CM | POA: Diagnosis present

## 2021-08-24 DIAGNOSIS — R600 Localized edema: Secondary | ICD-10-CM | POA: Diagnosis not present

## 2021-08-24 DIAGNOSIS — M19071 Primary osteoarthritis, right ankle and foot: Secondary | ICD-10-CM | POA: Diagnosis not present

## 2021-08-24 DIAGNOSIS — T402X5A Adverse effect of other opioids, initial encounter: Secondary | ICD-10-CM | POA: Diagnosis present

## 2021-08-24 DIAGNOSIS — Z791 Long term (current) use of non-steroidal anti-inflammatories (NSAID): Secondary | ICD-10-CM | POA: Diagnosis not present

## 2021-08-24 DIAGNOSIS — L89322 Pressure ulcer of left buttock, stage 2: Secondary | ICD-10-CM | POA: Diagnosis present

## 2021-08-24 DIAGNOSIS — M25511 Pain in right shoulder: Secondary | ICD-10-CM | POA: Diagnosis present

## 2021-08-24 DIAGNOSIS — Z79899 Other long term (current) drug therapy: Secondary | ICD-10-CM | POA: Diagnosis not present

## 2021-08-24 DIAGNOSIS — E872 Acidosis, unspecified: Secondary | ICD-10-CM | POA: Diagnosis present

## 2021-08-24 DIAGNOSIS — G928 Other toxic encephalopathy: Secondary | ICD-10-CM | POA: Diagnosis present

## 2021-08-24 DIAGNOSIS — G9340 Encephalopathy, unspecified: Secondary | ICD-10-CM | POA: Diagnosis present

## 2021-08-24 DIAGNOSIS — Z823 Family history of stroke: Secondary | ICD-10-CM | POA: Diagnosis not present

## 2021-08-24 DIAGNOSIS — Z8249 Family history of ischemic heart disease and other diseases of the circulatory system: Secondary | ICD-10-CM | POA: Diagnosis not present

## 2021-08-24 DIAGNOSIS — L89312 Pressure ulcer of right buttock, stage 2: Secondary | ICD-10-CM | POA: Diagnosis present

## 2021-08-24 DIAGNOSIS — M170 Bilateral primary osteoarthritis of knee: Secondary | ICD-10-CM | POA: Diagnosis present

## 2021-08-24 DIAGNOSIS — R262 Difficulty in walking, not elsewhere classified: Secondary | ICD-10-CM | POA: Diagnosis present

## 2021-08-24 DIAGNOSIS — Z7982 Long term (current) use of aspirin: Secondary | ICD-10-CM | POA: Diagnosis not present

## 2021-08-24 DIAGNOSIS — F32A Depression, unspecified: Secondary | ICD-10-CM | POA: Diagnosis present

## 2021-08-24 DIAGNOSIS — Z6841 Body Mass Index (BMI) 40.0 and over, adult: Secondary | ICD-10-CM | POA: Diagnosis not present

## 2021-08-24 DIAGNOSIS — E559 Vitamin D deficiency, unspecified: Secondary | ICD-10-CM | POA: Diagnosis present

## 2021-08-24 DIAGNOSIS — G8929 Other chronic pain: Secondary | ICD-10-CM | POA: Diagnosis present

## 2021-08-24 DIAGNOSIS — M25474 Effusion, right foot: Secondary | ICD-10-CM | POA: Diagnosis not present

## 2021-08-24 LAB — URIC ACID: Uric Acid, Serum: 4.1 mg/dL (ref 2.5–7.1)

## 2021-08-24 LAB — ECHOCARDIOGRAM COMPLETE
Area-P 1/2: 5.02 cm2
Height: 62 in
S' Lateral: 2.8 cm
Weight: 3552.05 oz

## 2021-08-24 LAB — COMPREHENSIVE METABOLIC PANEL
ALT: 30 U/L (ref 0–44)
AST: 55 U/L — ABNORMAL HIGH (ref 15–41)
Albumin: 2.4 g/dL — ABNORMAL LOW (ref 3.5–5.0)
Alkaline Phosphatase: 59 U/L (ref 38–126)
Anion gap: 6 (ref 5–15)
BUN: 10 mg/dL (ref 8–23)
CO2: 27 mmol/L (ref 22–32)
Calcium: 8.2 mg/dL — ABNORMAL LOW (ref 8.9–10.3)
Chloride: 104 mmol/L (ref 98–111)
Creatinine, Ser: 0.61 mg/dL (ref 0.44–1.00)
GFR, Estimated: >60 mL/min (ref >60)
Glucose, Bld: 109 mg/dL — ABNORMAL HIGH (ref 70–99)
Potassium: 4.1 mmol/L (ref 3.5–5.1)
Sodium: 137 mmol/L (ref 135–145)
Total Bilirubin: 0.2 mg/dL — ABNORMAL LOW (ref 0.3–1.2)
Total Protein: 5.9 g/dL — ABNORMAL LOW (ref 6.5–8.1)

## 2021-08-24 LAB — CBC
HCT: 29.5 % — ABNORMAL LOW (ref 36.0–46.0)
Hemoglobin: 9.4 g/dL — ABNORMAL LOW (ref 12.0–15.0)
MCH: 29.5 pg (ref 26.0–34.0)
MCHC: 31.9 g/dL (ref 30.0–36.0)
MCV: 92.5 fL (ref 80.0–100.0)
Platelets: 426 10*3/uL — ABNORMAL HIGH (ref 150–400)
RBC: 3.19 MIL/uL — ABNORMAL LOW (ref 3.87–5.11)
RDW: 15.2 % (ref 11.5–15.5)
WBC: 9.5 10*3/uL (ref 4.0–10.5)
nRBC: 0 % (ref 0.0–0.2)

## 2021-08-24 LAB — LACTIC ACID, PLASMA
Lactic Acid, Venous: 1.3 mmol/L (ref 0.5–1.9)
Lactic Acid, Venous: 2.2 mmol/L (ref 0.5–1.9)

## 2021-08-24 MED ORDER — IPRATROPIUM-ALBUTEROL 0.5-2.5 (3) MG/3ML IN SOLN
3.0000 mL | Freq: Two times a day (BID) | RESPIRATORY_TRACT | Status: DC
  Administered 2021-08-25 – 2021-08-29 (×9): 3 mL via RESPIRATORY_TRACT
  Filled 2021-08-24 (×9): qty 3

## 2021-08-24 MED ORDER — MORPHINE SULFATE (PF) 2 MG/ML IV SOLN
2.0000 mg | INTRAVENOUS | Status: DC | PRN
  Administered 2021-08-24: 2 mg via INTRAVENOUS
  Filled 2021-08-24: qty 1

## 2021-08-24 MED ORDER — IPRATROPIUM-ALBUTEROL 0.5-2.5 (3) MG/3ML IN SOLN
3.0000 mL | Freq: Four times a day (QID) | RESPIRATORY_TRACT | Status: DC | PRN
  Administered 2021-08-24 (×2): 3 mL via RESPIRATORY_TRACT
  Filled 2021-08-24 (×2): qty 3

## 2021-08-24 MED ORDER — GADOBUTROL 1 MMOL/ML IV SOLN
10.0000 mL | Freq: Once | INTRAVENOUS | Status: AC | PRN
  Administered 2021-08-24: 10 mL via INTRAVENOUS

## 2021-08-24 NOTE — Progress Notes (Addendum)
PROGRESS NOTE    Gracelynn B Doolen  MRN:[id number] DOB: 11/03/1950 DOA: 08/23/2021 PCP: Banks, Shannon R, MD    Brief Narrative:  [name] is a 70-year-old female with past medical history significant for asthma, osteoarthritis, sarcoidosis, depression who presented to [facility] ED on 11/22 with difficulty ambulating, bilateral lower extremity pain and swelling.  Patient reports symptoms have been ongoing for roughly 1 month. Patient reports recently has been taking doxycycline and clindamycin for symptoms without relief.  Patient reports difficulty ambulating now causing her to "crawl on the floor" and she cannot walk because of the swelling "makes her feet numb".  She also reports right shoulder pain from a scooting on the floor.  Patient previously use furniture to get around the house and denied any history of using a cane/walker.  Patient follows with orthopedics outpatient, Dr. Dean for her severe osteoarthritis of her knees.  In the ED, temperature 98.6 F, HR 98, RR 22, BP 131/86, SPO2 98% on room air.  Sodium 139, potassium 4.2, chloride 104, CO2 26, glucose 129, BUN 15, creatinine 0.56, AST 59, ALT 30, total bilirubin 0.2.  BNP 13.0.  WBC 9.1, hemoglobin 9.9, platelets of 45.  COVID-19 PCR negative.  Fluenz A/B PCR negative.  Hospital consulted for further evaluation management of intractable knee/foot pain with cellulitis and outpatient failed treatment.   Assessment & Plan:   Principal Problem:   Acute encephalopathy Active Problems:   SARCOIDOSIS   Knee pain   Foot pain   Fall   Difficulty in walking   Bilateral lower extremity edema   Hormone replacement therapy   SIRS (systemic inflammatory response syndrome) (HCC)   Acute metabolic encephalopathy, POA: Resolved Etiology likely secondary to medication side effect with narcotics.  UDS negative.  EtOH level less than 10.  CT head without contrast with no acute intracranial pathology, signs of atrophy and chronic  microvascular ischemic changes.  Now resolved and alert and oriented.  Right foot cellulitis Lactic acidosis: Resolved MR right foot with marked subcutaneous soft tissue swelling/edema/fluid involving the entire foot most notably along the dorsum of the foot likely severe cellulitis without discrete drainable soft tissue abscess, marked marrow edema involving the distal calcaneus and adjacent cuboid could be degenerative versus septic arthritis versus osteomyelitis.  Patient reports failed outpatient treatment with doxycycline and clindamycin.  Patient was evaluated by orthopedics, Dr. Dean and surgical foot infection appears unlikely. --Blood cultures x2: no growth <12h --CRP 14.5 --ESR 85 --Lactic Acid 2.2>1.3 --Cefazolin 2 g IV every 8 hours --Supportive care, pain control --CBC, ESR/CRP in am  Chronic pain bilateral knees/feet Patient reports progressive pain to bilateral knees and feet.  Follow-up with orthopedics outpatient, Dr. Dean.  X-ray left/right foot with severe end-stage tricompartmental arthritis and no acute fracture. Left foot x-ray with no acute osseous abnormality.  Right foot x-ray with no acute osseous abnormality. --Oxycodone 5 mg p.o. q4h PRN moderate pain  Bilateral lower extremity edema TTE with LVEF 70-75%, LV with hyperdynamic function, no LV regional wall motion normalities, normal LV diastolic parameters, IVC normal in size. Vascular duplex ultrasound bilateral lower extremities negative for DVT.  Ambulatory dysfunction --PT/OT evaluation  Asthma --Dulera 200-5 mcg 2 puffs twice daily --duonebs prn   Depression: Fluoxetine 10 mg p.o. daily   DVT prophylaxis: enoxaparin (LOVENOX) injection 40 mg Start: 08/23/21 2200   Code Status: Full Code Family Communication: No family present at bedside this morning  Disposition Plan:  Level of care: Telemetry Status is: Inpatient    Remains inpatient appropriate because: Unable to ambulate, awaiting PT/OT  evaluation, orthopedics evaluation, on IV antibiotics with failed outpatient treatment for right foot cellulitis   Consultants:  Orthopedics, Dr. Marlou Sa  Procedures:  TTE  Antimicrobials:  Cefazolin 11/23>>    Subjective: Patient seen examined bedside, resting calmly.  Currently having echo performed.  Continues to complain of knee and feet pain bilaterally.  Evaluated by orthopedics this morning, unlikely surgical foot infection.  Remains on IV antibiotics for severe right foot cellulitis.  No other questions or concerns at this time.  Denies headache, no dizziness, no chest pain, no shortness of breath, no abdominal pain, no weakness, no fatigue, no paresthesias.  No acute events overnight per nursing staff.  Objective: Vitals:   08/24/21 0528 08/24/21 0818 08/24/21 1150 08/24/21 1525  BP: (!) 136/56   (!) 143/71  Pulse: 91   97  Resp: 20   20  Temp: 98.6 F (37 C)   98.9 F (37.2 C)  TempSrc: Oral   Oral  SpO2: 97% 97% 98% 97%  Weight:      Height:        Intake/Output Summary (Last 24 hours) at 08/24/2021 1906 Last data filed at 08/24/2021 1858 Gross per 24 hour  Intake 940 ml  Output 1127 ml  Net -187 ml   Filed Weights   08/23/21 1948 08/24/21 0500  Weight: 106 kg 100.7 kg    Examination:  General exam: Appears calm and comfortable  Respiratory system: Clear to auscultation. Respiratory effort normal.  On room air Cardiovascular system: S1 & S2 heard, RRR. No JVD, murmurs, rubs, gallops or clicks. No pedal edema. Gastrointestinal system: Abdomen is nondistended, soft and nontender. No organomegaly or masses felt. Normal bowel sounds heard. Central nervous system: Alert and oriented. No focal neurological deficits. Extremities: Tenderness to palpation dorsal surface right foot, normal range of motion and preserved strength Skin: No rashes, lesions or ulcers Psychiatry: Judgement and insight appear normal. Mood & affect appropriate.     Data Reviewed: I have  personally reviewed following labs and imaging studies  CBC: Recent Labs  Lab 08/23/21 0656 08/24/21 0230  WBC 9.1 9.5  NEUTROABS 5.9  --   HGB 9.9* 9.4*  HCT 32.1* 29.5*  MCV 91.2 92.5  PLT 485* 387*   Basic Metabolic Panel: Recent Labs  Lab 08/23/21 0656 08/24/21 0230  NA 139 137  K 4.2 4.1  CL 104 104  CO2 26 27  GLUCOSE 129* 109*  BUN 15 10  CREATININE 0.56 0.61  CALCIUM 9.3 8.2*   GFR: Estimated Creatinine Clearance: 70.5 mL/min (by C-G formula based on SCr of 0.61 mg/dL). Liver Function Tests: Recent Labs  Lab 08/23/21 0656 08/24/21 0230  AST 59* 55*  ALT 30 30  ALKPHOS 71 59  BILITOT 0.2* 0.2*  PROT 6.9 5.9*  ALBUMIN 3.3* 2.4*   No results for input(s): LIPASE, AMYLASE in the last 168 hours. Recent Labs  Lab 08/23/21 1057  AMMONIA 20   Coagulation Profile: No results for input(s): INR, PROTIME in the last 168 hours. Cardiac Enzymes: Recent Labs  Lab 08/23/21 1754  CKTOTAL 1,460*   BNP (last 3 results) Recent Labs    08/17/21 1322  PROBNP 3.0   HbA1C: No results for input(s): HGBA1C in the last 72 hours. CBG: No results for input(s): GLUCAP in the last 168 hours. Lipid Profile: No results for input(s): CHOL, HDL, LDLCALC, TRIG, CHOLHDL, LDLDIRECT in the last 72 hours. Thyroid Function Tests: No results for  input(s): TSH, T4TOTAL, FREET4, T3FREE, THYROIDAB in the last 72 hours. Anemia Panel: No results for input(s): VITAMINB12, FOLATE, FERRITIN, TIBC, IRON, RETICCTPCT in the last 72 hours. Sepsis Labs: Recent Labs  Lab 08/24/21 0015 08/24/21 0230  LATICACIDVEN 2.2* 1.3    Recent Results (from the past 240 hour(s))  Resp Panel by RT-PCR (Flu A&B, Covid) Nasopharyngeal Swab     Status: None   Collection Time: 08/23/21 11:02 AM   Specimen: Nasopharyngeal Swab; Nasopharyngeal(NP) swabs in vial transport medium  Result Value Ref Range Status   SARS Coronavirus 2 by RT PCR NEGATIVE NEGATIVE Final    Comment: (NOTE) SARS-CoV-2  target nucleic acids are NOT DETECTED.  The SARS-CoV-2 RNA is generally detectable in upper respiratory specimens during the acute phase of infection. The lowest concentration of SARS-CoV-2 viral copies this assay can detect is 138 copies/mL. A negative result does not preclude SARS-Cov-2 infection and should not be used as the sole basis for treatment or other patient management decisions. A negative result may occur with  improper specimen collection/handling, submission of specimen other than nasopharyngeal swab, presence of viral mutation(s) within the areas targeted by this assay, and inadequate number of viral copies(<138 copies/mL). A negative result must be combined with clinical observations, patient history, and epidemiological information. The expected result is Negative.  Fact Sheet for Patients:  https://www.fda.gov/media/152166/download  Fact Sheet for Healthcare Providers:  https://www.fda.gov/media/152162/download  This test is no t yet approved or cleared by the [place] FDA and  has been authorized for detection and/or diagnosis of SARS-CoV-2 by FDA under an Emergency Use Authorization (EUA). This EUA will remain  in effect (meaning this test can be used) for the duration of the COVID-19 declaration under Section 564(b)(1) of the Act, 21 U.S.C.section 360bbb-3(b)(1), unless the authorization is terminated  or revoked sooner.       Influenza A by PCR NEGATIVE NEGATIVE Final   Influenza B by PCR NEGATIVE NEGATIVE Final    Comment: (NOTE) The Xpert Xpress SARS-CoV-2/FLU/RSV plus assay is intended as an aid in the diagnosis of influenza from Nasopharyngeal swab specimens and should not be used as a sole basis for treatment. Nasal washings and aspirates are unacceptable for Xpert Xpress SARS-CoV-2/FLU/RSV testing.  Fact Sheet for Patients: https://www.fda.gov/media/152166/download  Fact Sheet for Healthcare  Providers: https://www.fda.gov/media/152162/download  This test is not yet approved or cleared by the [place] FDA and has been authorized for detection and/or diagnosis of SARS-CoV-2 by FDA under an Emergency Use Authorization (EUA). This EUA will remain in effect (meaning this test can be used) for the duration of the COVID-19 declaration under Section 564(b)(1) of the Act, 21 U.S.C. section 360bbb-3(b)(1), unless the authorization is terminated or revoked.  Performed at [facility], [address], [place], [place] [address]   Culture, blood (routine x 2)     Status: None (Preliminary result)   Collection Time: 08/24/21 12:15 AM   Specimen: Right Antecubital; Blood  Result Value Ref Range Status   Specimen Description   Final    RIGHT ANTECUBITAL Performed at [facility], [address] [place], [place] [address]    Special Requests   Final    BOTTLES DRAWN AEROBIC ONLY Blood Culture adequate volume Performed at [facility], [address] [place], [place] [address]    Culture   Final    NO GROWTH < 12 HOURS Performed at [facility], [address] [place], [place] [address]    Report Status   PENDING  Incomplete  Culture, blood (routine x 2)     Status: None (Preliminary result)   Collection Time: 08/24/21 12:15 AM   Specimen: Left Antecubital; Blood  Result Value Ref Range Status   Specimen Description   Final    LEFT ANTECUBITAL Performed at [facility], [address] [place], [place] [address]    Special Requests   Final    BOTTLES DRAWN AEROBIC ONLY Blood Culture adequate volume Performed at [facility], [address] [place], [place] [address]    Culture   Final    NO GROWTH < 12 HOURS Performed at [facility], [address] [place], [place] [address]    Report Status PENDING  Incomplete         Radiology Studies: DG Knee 1-2  Views Left  Result Date: 08/24/2021 CLINICAL DATA:  Pain. EXAM: LEFT KNEE - 1-2 VIEW COMPARISON:  None. FINDINGS: There is diffusely decreased mineralization of the bones. No acute fracture or dislocation is seen. Moderate to severe tricompartmental degenerative changes are noted and most pronounced in the patellofemoral compartment. No definite joint effusion or significant soft tissue abnormality. IMPRESSION: 1. No acute fracture or dislocation. 2. Moderate to severe degenerative changes. Electronically Signed   By: Laura  Taylor M.D.   On: 08/24/2021 00:45   DG Knee 1-2 Views Right  Result Date: 08/24/2021 CLINICAL DATA:  Pain. EXAM: RIGHT KNEE - 1-2 VIEW COMPARISON:  None. FINDINGS: There is diffusely decreased mineralization the bones. No acute fracture or dislocation is seen. Severe tricompartmental degenerative changes are noted, most pronounced in the patellofemoral compartment. There is a small suprapatellar joint effusion. IMPRESSION: 1. No acute fracture or dislocation. 2. Severe osteoarthritis. 3. Small suprapatellar joint effusion. Electronically Signed   By: Laura  Taylor M.D.   On: 08/24/2021 00:48   CT Head Wo Contrast  Result Date: 08/23/2021 CLINICAL DATA:  Mental status changes in a 70-year-old female. EXAM: CT HEAD WITHOUT CONTRAST TECHNIQUE: Contiguous axial images were obtained from the base of the skull through the vertex without intravenous contrast. COMPARISON:  MRI of the brain of January 08, 2021. FINDINGS: Brain: No evidence of acute infarction, hemorrhage, hydrocephalus, extra-axial collection or mass lesion/mass effect. Signs of atrophy and chronic microvascular ischemic change similar accounting for comparison across modalities when compared to April of 2022. Vascular: No hyperdense vessel or unexpected calcification. Skull: Normal. Negative for fracture or focal lesion. Sinuses/Orbits: Visualized paranasal sinuses and orbits are unremarkable. Other: None. IMPRESSION: No  acute intracranial pathology. Signs of atrophy and chronic microvascular ischemic change similar accounting for comparison across modalities when compared to April of 2022. Electronically Signed   By: Geoffrey  Wile M.D.   On: 08/23/2021 08:55   MR FOOT RIGHT WO CONTRAST  Result Date: 08/24/2021 CLINICAL DATA:  Foot pain and swelling. EXAM: MRI OF THE RIGHT FOREFOOT WITHOUT CONTRAST TECHNIQUE: Multiplanar, multisequence MR imaging of the right foot was performed. No intravenous contrast was administered. COMPARISON:  Radiographs 07/30/2021 FINDINGS: Severe subcutaneous soft tissue swelling/edema/fluid involving the entire foot, most notably along the dorsum of the foot. No discrete fluid collection to suggest a drainable soft tissue abscess. This is likely severe cellulitis. Fairly marked marrow edema involving the distal calcaneus and adjacent cuboid. There are degenerative changes at the joint and a small joint effusion. This could be degenerative in nature but could not exclude the possibility of septic arthritis and osteomyelitis. Recommend clinical correlation with any pain or tenderness in this area.   A repeat MRI of the ankle/hindfoot without and with contrast may be helpful for further evaluation of this finding. The other bony structures are intact. No other findings suspicious for septic arthritis or osteomyelitis. There are moderate degenerative changes at the first MTP joint. The major tendons and ligaments appear grossly intact. IMPRESSION: 1. Marked subcutaneous soft tissue swelling/edema/fluid involving the entire foot, most notably along the dorsum of the foot. This is likely severe cellulitis. No discrete drainable soft tissue abscess. 2. Fairly marked marrow edema involving the distal calcaneus and adjacent cuboid. This could be degenerative in nature but could not exclude the possibility of septic arthritis and osteomyelitis. A repeat MRI of the ankle/hindfoot without and with contrast may be  helpful for further evaluation of this finding. Electronically Signed   By: P.  Gallerani M.D.   On: 08/24/2021 07:42   MR ANKLE RIGHT W WO CONTRAST  Result Date: 08/24/2021 CLINICAL DATA:  Ankle pain and swelling. Evaluate for osteoarthritis in right ankle. Osteomyelitis, ankle. EXAM: MRI OF THE RIGHT ANKLE WITHOUT AND WITH CONTRAST TECHNIQUE: Multiplanar, multisequence MR imaging of the ankle was performed before and after the administration of intravenous contrast. CONTRAST:  10mL GADAVIST GADOBUTROL 1 MMOL/ML IV SOLN COMPARISON:  Foot radiographs 08/24/2021 and 07/30/2021. Forefoot MRI 08/23/2021. FINDINGS: TENDONS Peroneal: Intact and normally positioned. Posteromedial: Intact and normally positioned. Anterior: Intact and normally positioned. There is a small amount of enhancement surrounding the tibialis anterior tendon in the midfoot which may reflect tenosynovitis. Achilles: Intact. Plantar Fascia: Intact. LIGAMENTS Lateral: The anterior and posterior talofibular and calcaneofibular ligaments are intact.The inferior tibiofibular ligaments appear intact. Medial: The deltoid and visualized portions of the spring ligament appear intact. CARTILAGE AND BONES Ankle Joint: Small ankle joint effusion without suspicious synovial enhancement. The talar dome and tibial plafond appear normal. Subtalar Joints/Sinus Tarsi: The subtalar joint appears normal. There is a small amount of fluid within the tarsal sinus without suspicious enhancement. Bones: As seen on recent forefoot MRI, there is bone marrow edema within the distal calcaneus and adjacent cuboid adjacent to the calcaneocuboid articulation. There is associated marrow enhancement in these areas. There is a small joint effusion with mild surrounding soft tissue enhancement. There is associated subchondral cyst formation peripherally, but no gross bone destruction. No other osseous abnormalities are identified. Other: Generalized subcutaneous edema surrounding  the ankle, extending into dorsum of the foot, similar to recent MRI. No focal fluid collections or other abnormal soft tissue enhancement identified. No evident skin ulceration over the calcaneocuboid articulation. IMPRESSION: IMPRESSION 1. Nonspecific calcaneocuboid arthropathy, unchanged from forefoot MRI done last night. Again, these findings could be degenerative, although a septic joint is not completely excluded. No soft tissue abscess or overlying skin ulceration identified. 2. No other significant arthropathic changes. 3. The ankle tendons and ligaments appear intact. Possible mild anterior tibialis tenosynovitis. 4. Nonspecific generalized subcutaneous edema which could reflect cellulitis. No evidence of soft tissue abscess. Electronically Signed   By: William  Veazey M.D.   On: 08/24/2021 09:51   DG Chest Portable 1 View  Result Date: 08/23/2021 CLINICAL DATA:  70-year-old female with history of bilateral leg swelling and feet pain. EXAM: PORTABLE CHEST 1 VIEW COMPARISON:  Chest x-ray 08/03/2021. FINDINGS: Lung volumes are low. No consolidative airspace disease. No pleural effusions. No pneumothorax. No pulmonary nodule or mass noted. Pulmonary vasculature and the cardiomediastinal silhouette are within normal limits. IMPRESSION: 1. Low lung volumes without radiographic evidence of acute cardiopulmonary disease. Electronically Signed   By: Daniel  Entrikin   M.D.   On: 08/23/2021 07:29   DG Foot 2 Views Left  Result Date: 08/24/2021 CLINICAL DATA:  Pain. EXAM: LEFT FOOT - 2 VIEW COMPARISON:  None. FINDINGS: There is diffusely decreased mineralization of the bones. No acute fracture or dislocation is seen. Degenerative changes are noted in the midfoot. There is moderate calcaneal spurring. Soft tissue swelling is present over the dorsum of the foot and at the ankle. IMPRESSION: No acute osseous abnormality. Electronically Signed   By: Laura  Taylor M.D.   On: 08/24/2021 00:44   DG Foot 2 Views  Right  Result Date: 08/24/2021 CLINICAL DATA:  Pain. EXAM: RIGHT FOOT - 2 VIEW COMPARISON:  None. FINDINGS: There is diffusely decreased mineralization of the bones. No acute fracture or dislocation is seen. There is mild hallux valgus deformity with degenerative changes at the first metatarsophalangeal joint. Degenerative changes are noted in the midfoot. There is moderate calcaneal spurring. Soft tissue swelling is noted over the dorsum of the foot and at the ankle. IMPRESSION: No acute osseous abnormality. Electronically Signed   By: Laura  Taylor M.D.   On: 08/24/2021 00:43   ECHOCARDIOGRAM COMPLETE  Result Date: 08/24/2021    ECHOCARDIOGRAM REPORT   Patient Name:   Tkai B Scollard Date of Exam: 08/24/2021 Medical Rec #:  [id number]        Height:       62.0 in Accession #:    [id number]       Weight:       222.0 lb Date of Birth:  06/15/1951        BSA:          1.998 m Patient Age:    70 years         BP:           136/56 mmHg Patient Gender: F                HR:           100 bpm. Exam Location:  Inpatient Procedure: 2D Echo, Cardiac Doppler and Color Doppler Indications:    Edema  History:        Patient has prior history of Echocardiogram examinations, most                 recent 10/10/2016.  Sonographer:    Sarah Pirrotta RDCS Referring Phys: [id number] CALDWELL POWELL JR IMPRESSIONS  1. Left ventricular ejection fraction, by estimation, is 70 to 75%. The left ventricle has hyperdynamic function. The left ventricle has no regional wall motion abnormalities. There is mild left ventricular hypertrophy. Left ventricular diastolic parameters were normal.  2. Right ventricular systolic function is normal. The right ventricular size is normal. Tricuspid regurgitation signal is inadequate for assessing PA pressure.  3. The mitral valve is normal in structure. No evidence of mitral valve regurgitation.  4. The aortic valve was not well visualized. Aortic valve regurgitation is not visualized. No aortic  stenosis is present.  5. The inferior vena cava is normal in size with <50% respiratory variability, suggesting right atrial pressure of 8 mmHg. FINDINGS  Left Ventricle: Left ventricular ejection fraction, by estimation, is 70 to 75%. The left ventricle has hyperdynamic function. The left ventricle has no regional wall motion abnormalities. The left ventricular internal cavity size was normal in size. There is mild left ventricular hypertrophy. Left ventricular diastolic parameters were normal. Right Ventricle: The right ventricular size is normal. No increase in right ventricular wall thickness. Right ventricular systolic   function is normal. Tricuspid regurgitation signal is inadequate for assessing PA pressure. Left Atrium: Left atrial size was normal in size. Right Atrium: Right atrial size was normal in size. Pericardium: There is no evidence of pericardial effusion. Mitral Valve: The mitral valve is normal in structure. No evidence of mitral valve regurgitation. Tricuspid Valve: The tricuspid valve is normal in structure. Tricuspid valve regurgitation is trivial. Aortic Valve: The aortic valve was not well visualized. Aortic valve regurgitation is not visualized. No aortic stenosis is present. Pulmonic Valve: The pulmonic valve was not well visualized. Pulmonic valve regurgitation is not visualized. Aorta: The aortic root and ascending aorta are structurally normal, with no evidence of dilitation. Venous: The inferior vena cava is normal in size with less than 50% respiratory variability, suggesting right atrial pressure of 8 mmHg. IAS/Shunts: The interatrial septum was not well visualized.  LEFT VENTRICLE PLAX 2D LVIDd:         4.50 cm   Diastology LVIDs:         2.80 cm   LV e' medial:    9.25 cm/s LV PW:         1.20 cm   LV E/e' medial:  11.7 LV IVS:        1.00 cm   LV e' lateral:   10.60 cm/s LVOT diam:     1.90 cm   LV E/e' lateral: 10.2 LV SV:         60 LV SV Index:   30 LVOT Area:     2.84 cm  RIGHT  VENTRICLE RV S prime:     15.90 cm/s TAPSE (M-mode): 1.8 cm LEFT ATRIUM             Index        RIGHT ATRIUM           Index LA diam:        3.30 cm 1.65 cm/m   RA Area:     11.70 cm LA Vol (A2C):   39.6 ml 19.82 ml/m  RA Volume:   28.30 ml  14.16 ml/m LA Vol (A4C):   31.0 ml 15.51 ml/m LA Biplane Vol: 37.0 ml 18.51 ml/m  AORTIC VALVE LVOT Vmax:   135.00 cm/s LVOT Vmean:  89.600 cm/s LVOT VTI:    0.210 m  AORTA Ao Root diam: 3.00 cm Ao Asc diam:  3.00 cm MITRAL VALVE MV Area (PHT): 5.02 cm     SHUNTS MV Decel Time: 151 msec     Systemic VTI:  0.21 m MV E velocity: 108.00 cm/s  Systemic Diam: 1.90 cm MV A velocity: 149.00 cm/s MV E/A ratio:  0.72 Oswaldo Milian MD Electronically signed by Oswaldo Milian MD Signature Date/Time: 08/24/2021/2:14:29 PM    Final    VAS [name] LOWER EXTREMITY VENOUS (DVT)  Result Date: 08/24/2021  Lower Venous DVT Study Patient Name:  SINDA LEEDOM  Date of Exam:   08/24/2021 Medical Rec #: [id number]         Accession #:    [id number] Date of Birth: 09-Dec-1950         Patient Gender: F Patient Age:   71 years Exam Location:  [facility] Procedure:      VAS [name] LOWER EXTREMITY VENOUS (DVT) Referring Phys: A POWELL JR --------------------------------------------------------------------------------  Indications: Edema.  Limitations: Poor ultrasound/tissue interface and body habitus. Comparison Study: No prior study Performing Technologist: Maudry Mayhew MHA, RDMS, RVT, RDCS  Examination Guidelines: A complete evaluation includes B-mode imaging, spectral  Doppler, color Doppler, and power Doppler as needed of all accessible portions of each vessel. Bilateral testing is considered an integral part of a complete examination. Limited examinations for reoccurring indications may be performed as noted. The reflux portion of the exam is performed with the patient in reverse Trendelenburg.  +---------+---------------+---------+-----------+----------+--------------+  RIGHT    CompressibilityPhasicitySpontaneityPropertiesThrombus Aging +---------+---------------+---------+-----------+----------+--------------+ CFV      Full           Yes      Yes                                 +---------+---------------+---------+-----------+----------+--------------+ SFJ      Full                                                        +---------+---------------+---------+-----------+----------+--------------+ FV Prox  Full                                                        +---------+---------------+---------+-----------+----------+--------------+ FV Mid   Full                                                        +---------+---------------+---------+-----------+----------+--------------+ FV DistalFull                                                        +---------+---------------+---------+-----------+----------+--------------+ PFV      Full                                                        +---------+---------------+---------+-----------+----------+--------------+ POP      Full           Yes      Yes                                 +---------+---------------+---------+-----------+----------+--------------+ PTV      Full                                                        +---------+---------------+---------+-----------+----------+--------------+ PERO     Full                                                        +---------+---------------+---------+-----------+----------+--------------+   +---------+---------------+---------+-----------+----------+--------------+   LEFT     CompressibilityPhasicitySpontaneityPropertiesThrombus Aging +---------+---------------+---------+-----------+----------+--------------+ CFV      Full           Yes      Yes                                 +---------+---------------+---------+-----------+----------+--------------+ SFJ      Full                                                         +---------+---------------+---------+-----------+----------+--------------+ FV Prox  Full                                                        +---------+---------------+---------+-----------+----------+--------------+ FV Mid   Full                                                        +---------+---------------+---------+-----------+----------+--------------+ FV DistalFull                                                        +---------+---------------+---------+-----------+----------+--------------+ PFV      Full                                                        +---------+---------------+---------+-----------+----------+--------------+ POP      Full           Yes      Yes                                 +---------+---------------+---------+-----------+----------+--------------+ PTV      Full                                                        +---------+---------------+---------+-----------+----------+--------------+ PERO     Full                                                        +---------+---------------+---------+-----------+----------+--------------+     Summary: BILATERAL: - No evidence of deep vein thrombosis seen in the lower extremities, bilaterally. -No evidence of popliteal cyst, bilaterally.   *See table(s) above for measurements and observations.    Preliminary         Scheduled   Meds:  cycloSPORINE  1 drop Both Eyes BID   diclofenac Sodium  2 g Topical QID   enoxaparin (LOVENOX) injection  40 mg Subcutaneous Q24H   estradiol  1 mg Oral Daily   FLUoxetine  10 mg Oral Daily   fluticasone  1 spray Each Nare Daily   loratadine  5 mg Oral QPM   mometasone-formoterol  2 puff Inhalation BID   Continuous Infusions:   ceFAZolin (ANCEF) IV 2 g (08/24/21 1559)     LOS: 0 days    Time spent: 42 minutes spent on chart review, discussion with nursing staff, consultants, updating family and  interview/physical exam; more than 50% of that time was spent in counseling and/or coordination of care.     J , DO [facility] Available via Epic secure chat 7am-7pm After these hours, please refer to coverage provider listed on [email or web address] 08/24/2021, 7:06 PM   

## 2021-08-24 NOTE — Progress Notes (Signed)
  Echocardiogram 2D Echocardiogram has been performed.  Fidel Levy 08/24/2021, 10:40 AM

## 2021-08-24 NOTE — Progress Notes (Signed)
Bilateral lower extremity venous duplex completed. Refer to "CV Proc" under chart review to view preliminary results.  08/24/2021 4:39 PM Kelby Aline., MHA, RVT, RDCS, RDMS

## 2021-08-24 NOTE — Progress Notes (Signed)
OT Cancellation Note  Patient Details Name: [name] MRN: [id number] DOB: 12-16-1950   Cancelled Treatment:    Reason Eval/Treat Not Completed: Medical issues which prohibited therapy. Pt currently in MRI, will check back as schedule allows.  Golden Circle, OTR/L Acute Rehab Services Pager [phone] Office [phone]    Almon Register 08/24/2021, 9:02 AM

## 2021-08-24 NOTE — TOC Initial Note (Signed)
Transition of Care [facility]) - Initial/Assessment Note    Patient Details  Name: [name] MRN: [id number] Date of Birth: 02/23/1951  [facility] CM/SW Contact:    Leeroy Cha, RN Phone Number: 08/24/2021, 7:46 AM  Clinical Narrative:                 70 y.o. female with medical history significant of asthma, arthritis, sarcodisosis, depression, and other medical problems presenting with difficulty ambulating, bilateral LE pain and swelling.   Symptoms have been going on for about 1 month.  Has been on doxycycline and ?clindamycin for symptoms.  Symptoms started several weeks ago with pain that started in her right foot.  She noted pain gradually spread to her left foot.  She notes chronic bilateral knee pain.  Over the period of time she's been having difficulty walking, she's had to crawl on the floor because of the pain.  She told the EDP, "she cannot walk because the swelling makes her feet numb".  She notes right shoulder pain from scooting on the floor.  Previous to her developing pain and difficulty walking, she used furniture to get around the house (did not use cane or walker).  She notes bilateral LE swelling.  Drinks occasionally.   Assessment/Plan Principal Problem:   Acute encephalopathy Active Problems:   Fall   Foot pain   SIRS (systemic inflammatory response syndrome) (HCC)   Knee pain   Difficulty in walking   Bilateral lower extremity edema   SARCOIDOSIS   Hormone replacement therapy   * Acute encephalopathy Sounds like this was due to pain meds She's A&O at this time Delirium precautions   SIRS (systemic inflammatory response syndrome) (HCC) Fever several hours after admission with tachycardia and bilateral LE erythema concerning for cellulitis Blood cultures CXR without evidence of acute disease Follow MRI R foot and plain films Follow on ancef Hold IVF with concern for edema   Foot pain Started in R foot, then left Afebrile, no  leukocytosis -> addendum, developed fever and tachy, will start abx Will hold further abx right now Plain films of bilateral feet, outpatient provider was planning for MRI of R foot, will get this here   Difficulty in walking Seems like this is due to pain.  No numbness or neurologic deficits noted on exam. Has severe OA in knees, but precipitating pain was in bilateral feet starting in right, then left) Exam with redness, but not too impressive from pain standpoint Follow inflammatory markers PT/OT   Knee pain Severe OA noted in 03/2021 images Repeat plain films   Bilateral lower extremity edema Follow echo Trace protein on UA, follow LE [name] Consider continued diuresis   SARCOIDOSIS She notes this is not active?  TOC PLAN OF CARE:  pt lives alone nearest relative is sister not in the area.  May need short term rehab due to weakness and gait.  Will follow physical therapy. Following for progression to see if encephalopathy improves with treatment.   Expected Discharge Plan: Skilled Nursing Facility Barriers to Discharge: Continued Medical Work up   Patient Goals and CMS Choice Patient states their goals for this hospitalization and ongoing recovery are:: i do not know right now i guess to go home CMS Medicare.gov Compare Post Acute Care list provided to:: Patient    Expected Discharge Plan and Services Expected Discharge Plan: [place]   Discharge Planning Services: CM Consult   Living arrangements for the past 2 months: Single Family  Home                                      Prior Living Arrangements/Services Living arrangements for the past 2 months: Single Family Home Lives with:: Self Patient language and need for interpreter reviewed:: Yes Do you feel safe going back to the place where you live?: Yes      Need for Family Participation in Patient Care: Yes (Comment) Care giver support system in place?: No (comment)   Criminal  Activity/Legal Involvement Pertinent to Current Situation/Hospitalization: No - Comment as needed  Activities of Daily Living Home Assistive Devices/Equipment: Walker (specify type), Hand-held shower hose ADL Screening (condition at time of admission) Patient's cognitive ability adequate to safely complete daily activities?: Yes Is the patient deaf or have difficulty hearing?: Yes Does the patient have difficulty seeing, even when wearing glasses/contacts?: Yes Does the patient have difficulty concentrating, remembering, or making decisions?: Yes Patient able to express need for assistance with ADLs?: Yes Does the patient have difficulty dressing or bathing?: Yes Independently performs ADLs?: Yes (appropriate for developmental age) Does the patient have difficulty walking or climbing stairs?: Yes Weakness of Legs: Both Weakness of Arms/Hands: Right  Permission Sought/Granted                  Emotional Assessment Appearance:: Appears stated age Attitude/Demeanor/Rapport: Other (comment) (confused somewhat) Affect (typically observed): Calm Orientation: : Oriented to Self, Oriented to Place, Oriented to Situation Alcohol / Substance Use: Alcohol Use Psych Involvement: No (comment)  Admission diagnosis:  Peripheral edema [R60.9] Encephalopathy [G93.40] Acute encephalopathy [G93.40] Fall [W19.XXXA] Patient Active Problem List   Diagnosis Date Noted   Acute encephalopathy 08/23/2021   Fall 08/23/2021   Difficulty in walking 08/23/2021   Bilateral lower extremity edema 08/23/2021   Hormone replacement therapy 08/23/2021   SIRS (systemic inflammatory response syndrome) ([place]) 08/23/2021   Osteoporosis 01/28/2021   Osteoarthritis of right wrist 01/28/2021   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   Foot pain 09/13/2020   Knee pain 08/24/2018   Fall in home 07/14/2015   TIBIALIS TENDINITIS 08/25/2009   CHEST PAIN, OTHER, PAIN 10/21/2008   PEDAL EDEMA 07/22/2008   ANKLE PAIN,  BILATERAL 04/01/2008   LOW BACK PAIN 04/01/2008   ACUTE BRONCHITIS 09/23/2007   SARCOIDOSIS 09/02/2007   Asthma 09/02/2007   Diverticulosis of colon 07/11/2007   BACK PAIN, CHRONIC 07/11/2007   PCP:  [name], MD Pharmacy:   [id number] - [place], [address] [place] [address] Phone: [phone] Fax: [phone]     Social Determinants of Health (SDOH) Interventions    Readmission Risk Interventions No flowsheet data found.

## 2021-08-24 NOTE — Evaluation (Signed)
Occupational Therapy Evaluation Patient Details Name: [name] MRN: [id number] DOB: 1951-08-22 Today's Date: 08/24/2021   History of Present Illness Tylin Stradley Huckeba is a 70 y.o. female presenting with difficulty ambulating, bilateral LE pain and swelling, acute encephalopathy,SIRS. MRI: rigtht ankle--non specific acute findings. 11/23 pt c/o more of right knee pain due to positioning in MRI machine.PHMx: asthma, arthritis, sarcodisosis, depression, and other medical problems.   Clinical Impression   This 70 yo female admitted with above presents to acute OT with PLOF of living by herself and doing her own basic ADLs, IADLs, and driving. Currently she needs Min A for bed mobility, Mod A sit<>stand/stand>turn, and setup/S-total A for basic ADLs. She will continue to benefit from acute OT with follow currently recommended at SNF.      Recommendations for follow up therapy are one component of a multi-disciplinary discharge planning process, led by the attending physician.  Recommendations may be updated based on patient status, additional functional criteria and insurance authorization.   Follow Up Recommendations  Skilled nursing-short term rehab (<3 hours/day)    Assistance Recommended at Discharge Frequent or constant Supervision/Assistance  Functional Status Assessment  Patient has had a recent decline in their functional status and demonstrates the ability to make significant improvements in function in a reasonable and predictable amount of time.  Equipment Recommendations  BSC/3in1       Precautions / Restrictions Precautions Precautions: Fall Restrictions Weight Bearing Restrictions: No      Mobility Bed Mobility Overal bed mobility: Needs Assistance Bed Mobility: Rolling;Sidelying to Sit Rolling: Min assist Sidelying to sit: Mod assist       General bed mobility comments: VCs for sequencing    Transfers Overall transfer level: Needs assistance Equipment  used: Rolling walker (2 wheels);1 person hand held assist Transfers: Sit to/from Stand Sit to Stand: Mod assist;From elevated surface                  Balance Overall balance assessment: Needs assistance Sitting-balance support: No upper extremity supported;Feet supported Sitting balance-Leahy Scale: Good     Standing balance support: Bilateral upper extremity supported;Reliant on assistive device for balance Standing balance-Leahy Scale: Poor                             ADL either performed or assessed with clinical judgement   ADL Overall ADL's : Needs assistance/impaired Eating/Feeding: Independent;Sitting   Grooming: Set up;Sitting   Upper Body Bathing: Set up;Sitting   Lower Body Bathing: Moderate assistance Lower Body Bathing Details (indicate cue type and reason): Mod A sit<>stand Upper Body Dressing : Set up;Sitting   Lower Body Dressing: Maximal assistance Lower Body Dressing Details (indicate cue type and reason): Mod A sit<>stand Toilet Transfer: Moderate assistance;+2 for safety/equipment;Stand-pivot;Rolling walker (2 wheels) Toilet Transfer Details (indicate cue type and reason): simulated bed>recliner Toileting- Clothing Manipulation and Hygiene: Total assistance Toileting - Clothing Manipulation Details (indicate cue type and reason): Mod A sit<>stand and maintain with RW             Vision Patient Visual Report: No change from baseline              Pertinent Vitals/Pain Pain Assessment: Faces Faces Pain Scale: Hurts even more Pain Location: right knee Pain Descriptors / Indicators: Aching;Sore Pain Intervention(s): Limited activity within patient's tolerance;Repositioned     Hand Dominance Right   Extremity/Trunk Assessment Upper Extremity Assessment Upper Extremity Assessment: RUE deficits/detail;LUE deficits/detail RUE Deficits /  Details: Decreased shoulder AROM (says arms are sore because she has been relying so heavily  on them since her feet have been hurting) LUE Deficits / Details: Decreased shoulder AROM (says arms are sore because she has been relying so heavily on them since her feet have been hurting)           Communication Communication Communication: No difficulties   Cognition Arousal/Alertness: Awake/alert Behavior During Therapy: WFL for tasks assessed/performed                                   General Comments: Pt had pain in in Bil legs and was crawling around on the floor pta (not sure how long she had been crawling around before admittance to hospital)                Home Living Family/patient expects to be discharged to:: Skilled nursing facility Living Arrangements: Alone Available Help at Discharge: Family;Available PRN/intermittently (sister in town (not sure how long)) Type of Home: House Home Access: Ramped entrance     [place]rier: One level     Bathroom Shower/Tub: Walk-in Hydrologist: Standard     Home Equipment: [place] (2 wheels);Grab bars - tub/shower;Hand held shower head;Grab bars - toilet          Prior Functioning/Environment Prior Level of Function : Independent/Modified Independent                        OT Problem List: Decreased strength;Impaired balance (sitting and/or standing);Pain      OT Treatment/Interventions: Self-care/ADL training;DME and/or AE instruction;Patient/family education;Balance training    OT Goals(Current goals can be found in the care plan section) Acute Rehab OT Goals Patient Stated Goal: for right knee to feel better OT Goal Formulation: With patient Time For Goal Achievement: 09/07/21 Potential to Achieve Goals: Good  OT Frequency: Min 2X/week   Barriers to D/C: Decreased caregiver support  sister curently          AM-PAC OT "6 Clicks" Daily Activity     Outcome Measure Help from another person eating meals?: None Help from  another person taking care of personal grooming?: A Little Help from another person toileting, which includes using toliet, bedpan, or urinal?: A Lot Help from another person bathing (including washing, rinsing, drying)?: A Lot Help from another person to put on and taking off regular upper body clothing?: A Little Help from another person to put on and taking off regular lower body clothing?: A Lot 6 Click Score: 16   End of Session Equipment Utilized During Treatment: Gait belt;Rolling walker (2 wheels) Nurse Communication:  (NT in room A'ing with transfer, recommended either +2 back to bed, move furniture so pt does not have to turn with RW, or use of sara stedy)  Activity Tolerance: Patient limited by pain Patient left: in chair;with call bell/phone within reach;with chair alarm set  OT Visit Diagnosis: Unsteadiness on feet (R26.81);Other abnormalities of gait and mobility (R26.89);Muscle weakness (generalized) (M62.81);Pain Pain - Right/Left: Right Pain - part of body: Knee                Time: 1105-1150 OT Time Calculation (min): 45 min Charges:  OT General Charges $OT Visit: 1 Visit OT Evaluation $OT Eval Moderate Complexity: 1 Mod OT Treatments $Self Care/Home Management : 23-37 mins  Golden Circle, OTR/L  Acute Rehab Services Pager [phone] Office [phone]    Almon Register 08/24/2021, 12:51 PM

## 2021-08-24 NOTE — Progress Notes (Signed)
History and imaging reviewed Surgical foot infection appears unlikely Full consult to follow

## 2021-08-24 NOTE — Consult Note (Signed)
Reason for Consult: Right foot pain Referring Physician: Dr. [place]  [name] is an 70 y.o. female.  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  She has had several courses of oral antibiotics.  Currently admitted for further management of inability to ambulate.  She has had multiple lab draws during her multiple ER encounters over the past several weeks.  Currently denies any fevers chills or flulike symptoms.  Sed rate C-reactive protein are elevated but white count remains below 10,000.  Has a known history of bilateral knee arthritis.  Denies any other orthopedic complaints currently.  No history of gout or pseudogout but she does have sarcoid.  MRI scan of the foot and ankle demonstrate edema with calcaneocuboid joint edema.  Most if not all of the patient's pain is on the dorsal aspect of the foot.  Past Medical History:  Diagnosis Date   Acute bronchitis    Allergy    Anxiety    Arthritis    knees   Asthma    Backache, unspecified    Cataract    removed both eyes   Depression    Diverticulitis    Diverticulosis of colon (without mention of hemorrhage)    Edema    Glaucoma    Lumbago    Obesity    Other chest pain    Pain in joint, ankle and foot    Sarcoidosis    Tibialis tendinitis    Unspecified asthma(493.90)     Past Surgical History:  Procedure Laterality Date   ABDOMINAL HYSTERECTOMY     CATARACT EXTRACTION W/PHACO Right 06/24/2014   Procedure: CATARACT EXTRACTION PHACO AND INTRAOCULAR LENS PLACEMENT ([place]) RIGHT EYE WITH GONIOSYNECHIALYSIS;  Surgeon: Marylynn Pearson, MD;  Location: [facility];  Service: Ophthalmology;  Laterality: Right;   COLONOSCOPY     EYE SURGERY Left    cataract surgery   HAND SURGERY     IR RADIOLOGIST EVAL & MGMT  11/14/2018   KNEE ARTHROSCOPY Right     Family History  Problem Relation Age of Onset   Hypertension Mother    Stroke Mother    Diabetes  Father    Sarcoidosis Sister    Colon cancer Neg Hx    Colon polyps Neg Hx    Esophageal cancer Neg Hx    Rectal cancer Neg Hx    Stomach cancer Neg Hx     Social History:  reports that she has never smoked. She has never used smokeless tobacco. She reports current alcohol use of about 1.0 standard drink per week. She reports that she does not use drugs.  Allergies:  Allergies  Allergen Reactions   Ibuprofen     rash   Sulfa Antibiotics Rash    Medications: I have reviewed the patient's current medications.  Results for orders placed or performed during the hospital encounter of 08/23/21 (from the past 48 hour(s))  Brain natriuretic peptide     Status: None   Collection Time: 08/23/21  6:56 AM  Result Value Ref Range   B Natriuretic Peptide 13.0 0.0 - 100.0 pg/mL    Comment: Performed at [facility], [address] [place], [place] [address]  Comprehensive metabolic panel     Status: Abnormal   Collection Time: 08/23/21  6:56 AM  Result Value Ref Range   Sodium 139 135 - 145 mmol/L   Potassium 4.2 3.5 - 5.1 mmol/L   Chloride 104 98 -  111 mmol/L   CO2 26 22 - 32 mmol/L   Glucose, Bld 129 (H) 70 - 99 mg/dL    Comment: Glucose reference range applies only to samples taken after fasting for at least 8 hours.   BUN 15 8 - 23 mg/dL   Creatinine, Ser 0.56 0.44 - 1.00 mg/dL   Calcium 9.3 8.9 - 10.3 mg/dL   Total Protein 6.9 6.5 - 8.1 g/dL   Albumin 3.3 (L) 3.5 - 5.0 g/dL   AST 59 (H) 15 - 41 U/L   ALT 30 0 - 44 U/L   Alkaline Phosphatase 71 38 - 126 U/L   Total Bilirubin 0.2 (L) 0.3 - 1.2 mg/dL   GFR, Estimated >60 >60 mL/min    Comment: (NOTE) Calculated using the CKD-EPI Creatinine Equation (2021)    Anion gap 9 5 - 15    Comment: Performed at [facility], [address] [place], [place] [address]  CBC with Differential     Status: Abnormal   Collection Time: 08/23/21  6:56 AM  Result Value Ref Range   WBC 9.1 4.0 - 10.5 K/uL    RBC 3.52 (L) 3.87 - 5.11 MIL/uL   Hemoglobin 9.9 (L) 12.0 - 15.0 g/dL   HCT 32.1 (L) 36.0 - 46.0 %   MCV 91.2 80.0 - 100.0 fL   MCH 28.1 26.0 - 34.0 pg   MCHC 30.8 30.0 - 36.0 g/dL   RDW 15.4 11.5 - 15.5 %   Platelets 485 (H) 150 - 400 K/uL   nRBC 0.0 0.0 - 0.2 %   Neutrophils Relative % 65 %   Neutro Abs 5.9 1.7 - 7.7 K/uL   Lymphocytes Relative 20 %   Lymphs Abs 1.8 0.7 - 4.0 K/uL   Monocytes Relative 8 %   Monocytes Absolute 0.7 0.1 - 1.0 K/uL   Eosinophils Relative 7 %   Eosinophils Absolute 0.6 (H) 0.0 - 0.5 K/uL   Basophils Relative 0 %   Basophils Absolute 0.0 0.0 - 0.1 K/uL   Immature Granulocytes 0 %   Abs Immature Granulocytes 0.04 0.00 - 0.07 K/uL    Comment: Performed at [facility], [address] [place], [place] [address]  Ethanol     Status: None   Collection Time: 08/23/21  8:27 AM  Result Value Ref Range   Alcohol, Ethyl (B) <10 <10 mg/dL    Comment: (NOTE) Lowest detectable limit for serum alcohol is 10 mg/dL.  For medical purposes only. Performed at [facility], [address], [place], [place] [address]   Ammonia     Status: None   Collection Time: 08/23/21 10:57 AM  Result Value Ref Range   Ammonia 20 9 - 35 umol/L    Comment: Performed at [facility], [address], [place], [place] [address]  Urine rapid drug screen (hosp performed)     Status: None   Collection Time: 08/23/21 11:02 AM  Result Value Ref Range   Opiates NONE DETECTED NONE DETECTED   Cocaine NONE DETECTED NONE DETECTED   Benzodiazepines NONE DETECTED NONE DETECTED   Amphetamines NONE DETECTED NONE DETECTED   Tetrahydrocannabinol NONE DETECTED NONE DETECTED   Barbiturates NONE DETECTED NONE DETECTED    Comment: (NOTE) DRUG SCREEN FOR MEDICAL PURPOSES ONLY.  IF CONFIRMATION IS NEEDED FOR ANY PURPOSE, NOTIFY LAB WITHIN 5 DAYS.  LOWEST DETECTABLE LIMITS FOR URINE DRUG SCREEN Drug Class  Cutoff (ng/mL) Amphetamine and metabolites    1000 Barbiturate and metabolites    200 Benzodiazepine                 254 Tricyclics and metabolites     300 Opiates and metabolites        300 Cocaine and metabolites        300 THC                            50 Performed at [facility], [place], [place] [address]   Urinalysis, Routine w reflex microscopic Urine, Catheterized     Status: Abnormal   Collection Time: 08/23/21 11:02 AM  Result Value Ref Range   Color, Urine YELLOW YELLOW   APPearance CLEAR CLEAR   Specific Gravity, Urine 1.023 1.005 - 1.030   pH 5.5 5.0 - 8.0   Glucose, UA NEGATIVE NEGATIVE mg/dL   Hgb urine dipstick NEGATIVE NEGATIVE   Bilirubin Urine NEGATIVE NEGATIVE   Ketones, ur NEGATIVE NEGATIVE mg/dL   Protein, ur TRACE (A) NEGATIVE mg/dL   Nitrite NEGATIVE NEGATIVE   Leukocytes,Ua TRACE (A) NEGATIVE   RBC / HPF 0-5 0 - 5 RBC/hpf   WBC, UA 0-5 0 - 5 WBC/hpf   Mucus PRESENT    Hyaline Casts, UA PRESENT     Comment: Performed at [facility], [place], [place] [address]  Resp Panel by RT-PCR (Flu A&B, Covid) Nasopharyngeal Swab     Status: None   Collection Time: 08/23/21 11:02 AM   Specimen: Nasopharyngeal Swab; Nasopharyngeal(NP) swabs in vial transport medium  Result Value Ref Range   SARS Coronavirus 2 by RT PCR NEGATIVE NEGATIVE    Comment: (NOTE) SARS-CoV-2 target nucleic acids are NOT DETECTED.  The SARS-CoV-2 RNA is generally detectable in upper respiratory specimens during the acute phase of infection. The lowest concentration of SARS-CoV-2 viral copies this assay can detect is 138 copies/mL. A negative result does not preclude SARS-Cov-2 infection and should not be used as the sole basis for treatment or other patient management decisions. A negative result may occur with  improper specimen collection/handling, submission of specimen other than nasopharyngeal swab, presence  of viral mutation(s) within the areas targeted by this assay, and inadequate number of viral copies(<138 copies/mL). A negative result must be combined with clinical observations, patient history, and epidemiological information. The expected result is Negative.  Fact Sheet for Patients:  EntrepreneurPulse.com.au  Fact Sheet for Healthcare Providers:  IncredibleEmployment.be  This test is no t yet approved or cleared by the [place] FDA and  has been authorized for detection and/or diagnosis of SARS-CoV-2 by FDA under an Emergency Use Authorization (EUA). This EUA will remain  in effect (meaning this test can be used) for the duration of the COVID-19 declaration under Section 564(b)(1) of the Act, 21 U.S.C.section 360bbb-3(b)(1), unless the authorization is terminated  or revoked sooner.       Influenza A by PCR NEGATIVE NEGATIVE   Influenza B by PCR NEGATIVE NEGATIVE    Comment: (NOTE) The Xpert Xpress SARS-CoV-2/FLU/RSV plus assay is intended as an aid in the diagnosis of influenza from Nasopharyngeal swab specimens and should not be used as a sole basis for treatment. Nasal washings and aspirates are unacceptable for Xpert Xpress SARS-CoV-2/FLU/RSV testing.  Fact Sheet for Patients: EntrepreneurPulse.com.au  Fact Sheet for Healthcare Providers: IncredibleEmployment.be  This test is not yet approved or cleared by  the [employer] and has been authorized for detection and/or diagnosis of SARS-CoV-2 by FDA under an Emergency Use Authorization (EUA). This EUA will remain in effect (meaning this test can be used) for the duration of the COVID-19 declaration under Section 564(b)(1) of the Act, 21 U.S.C. section 360bbb-3(b)(1), unless the authorization is terminated or revoked.  Performed at [facility], [address] [place], [place] [address]   Blood gas, venous      Status: Abnormal   Collection Time: 08/23/21  5:54 PM  Result Value Ref Range   pH, Ven 7.461 (H) 7.250 - 7.430   pCO2, Ven 38.8 (L) 44.0 - 60.0 mmHg   pO2, Ven 75.1 (H) 32.0 - 45.0 mmHg   Bicarbonate 27.3 20.0 - 28.0 mmol/L   Acid-Base Excess 3.7 (H) 0.0 - 2.0 mmol/L   O2 Saturation 94.5 %   Patient temperature 98.6     Comment: Performed at [facility], [place] [address], [place], [place] [address]  C-reactive protein     Status: Abnormal   Collection Time: 08/23/21  5:54 PM  Result Value Ref Range   CRP 14.5 (H) <1.0 mg/dL    Comment: Performed at [facility] [address], [place], [place] [address]  Sedimentation rate     Status: Abnormal   Collection Time: 08/23/21  5:54 PM  Result Value Ref Range   Sed Rate 85 (H) 0 - 22 mm/hr    Comment: Performed at [facility], [place] [address] [place], [place] [address]  CK     Status: Abnormal   Collection Time: 08/23/21  5:54 PM  Result Value Ref Range   Total CK 1,460 (H) 38 - 234 U/L    Comment: Performed at [facility], [place] [address] [place], [place] [address]  Culture, blood (routine x 2)     Status: None (Preliminary result)   Collection Time: 08/24/21 12:15 AM   Specimen: Right Antecubital; Blood  Result Value Ref Range   Specimen Description      RIGHT ANTECUBITAL Performed at [facility], [place] [address] [place], [place] [address]    Special Requests      BOTTLES DRAWN AEROBIC ONLY Blood Culture adequate volume Performed at [facility] [address], [place], [place] [address]    Culture      NO GROWTH < 12 HOURS Performed at [facility] [address], [place], [place] [address]    Report Status PENDING   Culture, blood (routine x 2)     Status: None (Preliminary result)   Collection Time: 08/24/21 12:15 AM   Specimen: Left Antecubital; Blood  Result Value Ref Range   Specimen Description      LEFT  ANTECUBITAL Performed at [facility] [address], [place], [place] [address]    Special Requests      BOTTLES DRAWN AEROBIC ONLY Blood Culture adequate volume Performed at [facility] [address] [place], [place] [address]    Culture      NO GROWTH < 12 HOURS Performed at [facility] [address] [place],  [address]    Report Status PENDING   Lactic acid, plasma     Status: Abnormal   Collection Time: 08/24/21 12:15 AM  Result Value Ref Range   Lactic Acid, Venous 2.2 (HH) 0.5 - 1.9 mmol/L    Comment: CRITICAL RESULT CALLED TO, READ  BACK BY AND VERIFIED WITH:  Alvis Lemmings RN 08/24/21 @ VS Performed at [facility], [place] [address], [place], [place] [address]   Comprehensive metabolic panel     Status: Abnormal   Collection Time: 08/24/21  2:30 AM  Result Value Ref Range   Sodium 137 135 - 145 mmol/L   Potassium 4.1 3.5 - 5.1 mmol/L   Chloride 104 98 - 111 mmol/L   CO2 27 22 - 32 mmol/L   Glucose, Bld 109 (H) 70 - 99 mg/dL    Comment: Glucose reference range applies only to samples taken after fasting for at least 8 hours.   BUN 10 8 - 23 mg/dL   Creatinine, Ser 0.61 0.44 - 1.00 mg/dL   Calcium 8.2 (L) 8.9 - 10.3 mg/dL   Total Protein 5.9 (L) 6.5 - 8.1 g/dL   Albumin 2.4 (L) 3.5 - 5.0 g/dL   AST 55 (H) 15 - 41 U/L   ALT 30 0 - 44 U/L   Alkaline Phosphatase 59 38 - 126 U/L   Total Bilirubin 0.2 (L) 0.3 - 1.2 mg/dL   GFR, Estimated >60 >60 mL/min    Comment: (NOTE) Calculated using the CKD-EPI Creatinine Equation (2021)    Anion gap 6 5 - 15    Comment: Performed at [facility], [place] [address], [place], [place] [address]  CBC     Status: Abnormal   Collection Time: 08/24/21  2:30 AM  Result Value Ref Range   WBC 9.5 4.0 - 10.5 K/uL   RBC 3.19 (L) 3.87 - 5.11 MIL/uL   Hemoglobin 9.4 (L) 12.0 - 15.0 g/dL   HCT 29.5 (L) 36.0 - 46.0 %   MCV 92.5 80.0 - 100.0 fL    MCH 29.5 26.0 - 34.0 pg   MCHC 31.9 30.0 - 36.0 g/dL   RDW 15.2 11.5 - 15.5 %   Platelets 426 (H) 150 - 400 K/uL   nRBC 0.0 0.0 - 0.2 %    Comment: Performed at [facility], [place] [address], [place], [place] [address]  Uric acid     Status: None   Collection Time: 08/24/21  2:30 AM  Result Value Ref Range   Uric Acid, Serum 4.1 2.5 - 7.1 mg/dL    Comment: Performed at [facility], [place] [address] [place], [place] [address]  Lactic acid, plasma     Status: None   Collection Time: 08/24/21  2:30 AM  Result Value Ref Range   Lactic Acid, Venous 1.3 0.5 - 1.9 mmol/L    Comment: Performed at [facility], [place] [address] [place], [place] [address]    DG Knee 1-2 Views Left  Result Date: 08/24/2021 CLINICAL DATA:  Pain. EXAM: LEFT KNEE - 1-2 VIEW COMPARISON:  None. FINDINGS: There is diffusely decreased mineralization of the bones. No acute fracture or dislocation is seen. Moderate to severe tricompartmental degenerative changes are noted and most pronounced in the patellofemoral compartment. No definite joint effusion or significant soft tissue abnormality. IMPRESSION: 1. No acute fracture or dislocation. 2. Moderate to severe degenerative changes. Electronically Signed   By: Brett Fairy M.D.   On: 08/24/2021 00:45   DG Knee 1-2 Views Right  Result Date: 08/24/2021 CLINICAL DATA:  Pain. EXAM: RIGHT KNEE - 1-2 VIEW COMPARISON:  None. FINDINGS: There is diffusely decreased mineralization the bones. No acute fracture or dislocation is seen. Severe tricompartmental degenerative changes are noted, most pronounced in the patellofemoral  compartment. There is a small suprapatellar joint effusion. IMPRESSION: 1. No acute fracture or dislocation. 2. Severe osteoarthritis. 3. Small suprapatellar joint effusion. Electronically Signed   By: Brett Fairy M.D.   On: 08/24/2021 00:48   CT Head Wo Contrast  Result Date: 08/23/2021 CLINICAL DATA:   Mental status changes in a 70 year old female. EXAM: CT HEAD WITHOUT CONTRAST TECHNIQUE: Contiguous axial images were obtained from the base of the skull through the vertex without intravenous contrast. COMPARISON:  MRI of the brain of January 08, 2021. FINDINGS: Brain: No evidence of acute infarction, hemorrhage, hydrocephalus, extra-axial collection or mass lesion/mass effect. Signs of atrophy and chronic microvascular ischemic change similar accounting for comparison across modalities when compared to April of 2022. Vascular: No hyperdense vessel or unexpected calcification. Skull: Normal. Negative for fracture or focal lesion. Sinuses/Orbits: Visualized paranasal sinuses and orbits are unremarkable. Other: None. IMPRESSION: No acute intracranial pathology. Signs of atrophy and chronic microvascular ischemic change similar accounting for comparison across modalities when compared to April of 2022. Electronically Signed   By: Zetta Bills M.D.   On: 08/23/2021 08:55   MR FOOT RIGHT WO CONTRAST  Result Date: 08/24/2021 CLINICAL DATA:  Foot pain and swelling. EXAM: MRI OF THE RIGHT FOREFOOT WITHOUT CONTRAST TECHNIQUE: Multiplanar, multisequence MR imaging of the right foot was performed. No intravenous contrast was administered. COMPARISON:  Radiographs 07/30/2021 FINDINGS: Severe subcutaneous soft tissue swelling/edema/fluid involving the entire foot, most notably along the dorsum of the foot. No discrete fluid collection to suggest a drainable soft tissue abscess. This is likely severe cellulitis. Fairly marked marrow edema involving the distal calcaneus and adjacent cuboid. There are degenerative changes at the joint and a small joint effusion. This could be degenerative in nature but could not exclude the possibility of septic arthritis and osteomyelitis. Recommend clinical correlation with any pain or tenderness in this area. A repeat MRI of the ankle/hindfoot without and with contrast may be helpful for  further evaluation of this finding. The other bony structures are intact. No other findings suspicious for septic arthritis or osteomyelitis. There are moderate degenerative changes at the first MTP joint. The major tendons and ligaments appear grossly intact. IMPRESSION: 1. Marked subcutaneous soft tissue swelling/edema/fluid involving the entire foot, most notably along the dorsum of the foot. This is likely severe cellulitis. No discrete drainable soft tissue abscess. 2. Fairly marked marrow edema involving the distal calcaneus and adjacent cuboid. This could be degenerative in nature but could not exclude the possibility of septic arthritis and osteomyelitis. A repeat MRI of the ankle/hindfoot without and with contrast may be helpful for further evaluation of this finding. Electronically Signed   By: Marijo Sanes M.D.   On: 08/24/2021 07:42   MR ANKLE RIGHT W WO CONTRAST  Result Date: 08/24/2021 CLINICAL DATA:  Ankle pain and swelling. Evaluate for osteoarthritis in right ankle. Osteomyelitis, ankle. EXAM: MRI OF THE RIGHT ANKLE WITHOUT AND WITH CONTRAST TECHNIQUE: Multiplanar, multisequence MR imaging of the ankle was performed before and after the administration of intravenous contrast. CONTRAST:  44mL GADAVIST GADOBUTROL 1 MMOL/ML IV SOLN COMPARISON:  Foot radiographs 08/24/2021 and 07/30/2021. Forefoot MRI 08/23/2021. FINDINGS: TENDONS Peroneal: Intact and normally positioned. Posteromedial: Intact and normally positioned. Anterior: Intact and normally positioned. There is a small amount of enhancement surrounding the tibialis anterior tendon in the midfoot which may reflect tenosynovitis. Achilles: Intact. Plantar Fascia: Intact. LIGAMENTS Lateral: The anterior and posterior talofibular and calcaneofibular ligaments are intact.The inferior tibiofibular ligaments appear intact. Medial: The  deltoid and visualized portions of the spring ligament appear intact. CARTILAGE AND BONES Ankle Joint: Small ankle  joint effusion without suspicious synovial enhancement. The talar dome and tibial plafond appear normal. Subtalar Joints/Sinus Tarsi: The subtalar joint appears normal. There is a small amount of fluid within the tarsal sinus without suspicious enhancement. Bones: As seen on recent forefoot MRI, there is bone marrow edema within the distal calcaneus and adjacent cuboid adjacent to the calcaneocuboid articulation. There is associated marrow enhancement in these areas. There is a small joint effusion with mild surrounding soft tissue enhancement. There is associated subchondral cyst formation peripherally, but no gross bone destruction. No other osseous abnormalities are identified. Other: Generalized subcutaneous edema surrounding the ankle, extending into dorsum of the foot, similar to recent MRI. No focal fluid collections or other abnormal soft tissue enhancement identified. No evident skin ulceration over the calcaneocuboid articulation. IMPRESSION: IMPRESSION 1. Nonspecific calcaneocuboid arthropathy, unchanged from forefoot MRI done last night. Again, these findings could be degenerative, although a septic joint is not completely excluded. No soft tissue abscess or overlying skin ulceration identified. 2. No other significant arthropathic changes. 3. The ankle tendons and ligaments appear intact. Possible mild anterior tibialis tenosynovitis. 4. Nonspecific generalized subcutaneous edema which could reflect cellulitis. No evidence of soft tissue abscess. Electronically Signed   By: Richardean Sale M.D.   On: 08/24/2021 09:51   DG Chest Portable 1 View  Result Date: 08/23/2021 CLINICAL DATA:  70 year old female with history of bilateral leg swelling and feet pain. EXAM: PORTABLE CHEST 1 VIEW COMPARISON:  Chest x-ray 08/03/2021. FINDINGS: Lung volumes are low. No consolidative airspace disease. No pleural effusions. No pneumothorax. No pulmonary nodule or mass noted. Pulmonary vasculature and the  cardiomediastinal silhouette are within normal limits. IMPRESSION: 1. Low lung volumes without radiographic evidence of acute cardiopulmonary disease. Electronically Signed   By: Vinnie Langton M.D.   On: 08/23/2021 07:29   DG Foot 2 Views Left  Result Date: 08/24/2021 CLINICAL DATA:  Pain. EXAM: LEFT FOOT - 2 VIEW COMPARISON:  None. FINDINGS: There is diffusely decreased mineralization of the bones. No acute fracture or dislocation is seen. Degenerative changes are noted in the midfoot. There is moderate calcaneal spurring. Soft tissue swelling is present over the dorsum of the foot and at the ankle. IMPRESSION: No acute osseous abnormality. Electronically Signed   By: Brett Fairy M.D.   On: 08/24/2021 00:44   DG Foot 2 Views Right  Result Date: 08/24/2021 CLINICAL DATA:  Pain. EXAM: RIGHT FOOT - 2 VIEW COMPARISON:  None. FINDINGS: There is diffusely decreased mineralization of the bones. No acute fracture or dislocation is seen. There is mild hallux valgus deformity with degenerative changes at the first metatarsophalangeal joint. Degenerative changes are noted in the midfoot. There is moderate calcaneal spurring. Soft tissue swelling is noted over the dorsum of the foot and at the ankle. IMPRESSION: No acute osseous abnormality. Electronically Signed   By: Brett Fairy M.D.   On: 08/24/2021 00:43   ECHOCARDIOGRAM COMPLETE  Result Date: 08/24/2021    ECHOCARDIOGRAM REPORT   Patient Name:   KAYLIANA CODD Date of Exam: 08/24/2021 Medical Rec #:  [id number]        Height:       62.0 in Accession #:    [id number]       Weight:       222.0 lb Date of Birth:  28-Feb-1951        BSA:  1.998 m Patient Age:    54 years         BP:           136/56 mmHg Patient Gender: F                HR:           100 bpm. Exam Location:  Inpatient Procedure: 2D Echo, Cardiac Doppler and Color Doppler Indications:    Edema  History:        Patient has prior history of Echocardiogram examinations, most                  recent 10/10/2016.  Sonographer:    Bernadene Person RDCS Referring Phys: [phone] A CALDWELL [place]  1. Left ventricular ejection fraction, by estimation, is 70 to 75%. The left ventricle has hyperdynamic function. The left ventricle has no regional wall motion abnormalities. There is mild left ventricular hypertrophy. Left ventricular diastolic parameters were normal.  2. Right ventricular systolic function is normal. The right ventricular size is normal. Tricuspid regurgitation signal is inadequate for assessing PA pressure.  3. The mitral valve is normal in structure. No evidence of mitral valve regurgitation.  4. The aortic valve was not well visualized. Aortic valve regurgitation is not visualized. No aortic stenosis is present.  5. The inferior vena cava is normal in size with <50% respiratory variability, suggesting right atrial pressure of 8 mmHg. FINDINGS  Left Ventricle: Left ventricular ejection fraction, by estimation, is 70 to 75%. The left ventricle has hyperdynamic function. The left ventricle has no regional wall motion abnormalities. The left ventricular internal cavity size was normal in size. There is mild left ventricular hypertrophy. Left ventricular diastolic parameters were normal. Right Ventricle: The right ventricular size is normal. No increase in right ventricular wall thickness. Right ventricular systolic function is normal. Tricuspid regurgitation signal is inadequate for assessing PA pressure. Left Atrium: Left atrial size was normal in size. Right Atrium: Right atrial size was normal in size. Pericardium: There is no evidence of pericardial effusion. Mitral Valve: The mitral valve is normal in structure. No evidence of mitral valve regurgitation. Tricuspid Valve: The tricuspid valve is normal in structure. Tricuspid valve regurgitation is trivial. Aortic Valve: The aortic valve was not well visualized. Aortic valve regurgitation is not visualized. No aortic stenosis is  present. Pulmonic Valve: The pulmonic valve was not well visualized. Pulmonic valve regurgitation is not visualized. Aorta: The aortic root and ascending aorta are structurally normal, with no evidence of dilitation. Venous: The inferior vena cava is normal in size with less than 50% respiratory variability, suggesting right atrial pressure of 8 mmHg. IAS/Shunts: The interatrial septum was not well visualized.  LEFT VENTRICLE PLAX 2D LVIDd:         4.50 cm   Diastology LVIDs:         2.80 cm   LV e' medial:    9.25 cm/s LV PW:         1.20 cm   LV E/e' medial:  11.7 LV IVS:        1.00 cm   LV e' lateral:   10.60 cm/s LVOT diam:     1.90 cm   LV E/e' lateral: 10.2 LV SV:         60 LV SV Index:   30 LVOT Area:     2.84 cm  RIGHT VENTRICLE RV S prime:     15.90 cm/s TAPSE (M-mode): 1.8 cm LEFT  ATRIUM             Index        RIGHT ATRIUM           Index LA diam:        3.30 cm 1.65 cm/m   RA Area:     11.70 cm LA Vol (A2C):   39.6 ml 19.82 ml/m  RA Volume:   28.30 ml  14.16 ml/m LA Vol (A4C):   31.0 ml 15.51 ml/m LA Biplane Vol: 37.0 ml 18.51 ml/m  AORTIC VALVE LVOT Vmax:   135.00 cm/s LVOT Vmean:  89.600 cm/s LVOT VTI:    0.210 m  AORTA Ao Root diam: 3.00 cm Ao Asc diam:  3.00 cm MITRAL VALVE MV Area (PHT): 5.02 cm     SHUNTS MV Decel Time: 151 msec     Systemic VTI:  0.21 m MV E velocity: 108.00 cm/s  Systemic Diam: 1.90 cm MV A velocity: 149.00 cm/s MV E/A ratio:  0.72 Oswaldo Milian MD Electronically signed by Oswaldo Milian MD Signature Date/Time: 08/24/2021/2:14:29 PM    Final    VAS [name] LOWER EXTREMITY VENOUS (DVT)  Result Date: 08/24/2021  Lower Venous DVT Study Patient Name:  EUPHA LOBB  Date of Exam:   08/24/2021 Medical Rec #: [id number]         Accession #:    [id number] Date of Birth: 1951/08/10         Patient Gender: F Patient Age:   80 years Exam Location:  [facility] Procedure:      VAS [name] LOWER EXTREMITY VENOUS (DVT) Referring Phys: A POWELL JR  --------------------------------------------------------------------------------  Indications: Edema.  Limitations: Poor ultrasound/tissue interface and body habitus. Comparison Study: No prior study Performing Technologist: Maudry Mayhew MHA, RDMS, RVT, RDCS  Examination Guidelines: A complete evaluation includes B-mode imaging, spectral Doppler, color Doppler, and power Doppler as needed of all accessible portions of each vessel. Bilateral testing is considered an integral part of a complete examination. Limited examinations for reoccurring indications may be performed as noted. The reflux portion of the exam is performed with the patient in reverse Trendelenburg.  +---------+---------------+---------+-----------+----------+--------------+ RIGHT    CompressibilityPhasicitySpontaneityPropertiesThrombus Aging +---------+---------------+---------+-----------+----------+--------------+ CFV      Full           Yes      Yes                                 +---------+---------------+---------+-----------+----------+--------------+ SFJ      Full                                                        +---------+---------------+---------+-----------+----------+--------------+ FV Prox  Full                                                        +---------+---------------+---------+-----------+----------+--------------+ FV Mid   Full                                                        +---------+---------------+---------+-----------+----------+--------------+  FV DistalFull                                                        +---------+---------------+---------+-----------+----------+--------------+ PFV      Full                                                        +---------+---------------+---------+-----------+----------+--------------+ POP      Full           Yes      Yes                                  +---------+---------------+---------+-----------+----------+--------------+ PTV      Full                                                        +---------+---------------+---------+-----------+----------+--------------+ PERO     Full                                                        +---------+---------------+---------+-----------+----------+--------------+   +---------+---------------+---------+-----------+----------+--------------+ LEFT     CompressibilityPhasicitySpontaneityPropertiesThrombus Aging +---------+---------------+---------+-----------+----------+--------------+ CFV      Full           Yes      Yes                                 +---------+---------------+---------+-----------+----------+--------------+ SFJ      Full                                                        +---------+---------------+---------+-----------+----------+--------------+ FV Prox  Full                                                        +---------+---------------+---------+-----------+----------+--------------+ FV Mid   Full                                                        +---------+---------------+---------+-----------+----------+--------------+ FV DistalFull                                                        +---------+---------------+---------+-----------+----------+--------------+  PFV      Full                                                        +---------+---------------+---------+-----------+----------+--------------+ POP      Full           Yes      Yes                                 +---------+---------------+---------+-----------+----------+--------------+ PTV      Full                                                        +---------+---------------+---------+-----------+----------+--------------+ PERO     Full                                                         +---------+---------------+---------+-----------+----------+--------------+     Summary: BILATERAL: - No evidence of deep vein thrombosis seen in the lower extremities, bilaterally. -No evidence of popliteal cyst, bilaterally.   *See table(s) above for measurements and observations.    Preliminary     Review of Systems  Constitutional:  Positive for activity change.  Musculoskeletal:  Positive for arthralgias.  All other systems reviewed and are negative. Blood pressure (!) 143/71, pulse 97, temperature 98.9 F (37.2 C), temperature source Oral, resp. rate 20, height 5\' 2"  (1.575 m), weight 100.7 kg, SpO2 97 %. Physical Exam Vitals reviewed.  HENT:     Head: Normocephalic.     Nose: Nose normal.     Mouth/Throat:     Mouth: Mucous membranes are moist.  Eyes:     Pupils: Pupils are equal, round, and reactive to light.  Cardiovascular:     Rate and Rhythm: Normal rate.     Pulses: Normal pulses.  Pulmonary:     Effort: Pulmonary effort is normal.  Abdominal:     General: Abdomen is flat.  Musculoskeletal:     Cervical back: Normal range of motion.  Skin:    General: Skin is warm.     Capillary Refill: Capillary refill takes less than 2 seconds.  Neurological:     General: No focal deficit present.     Mental Status: She is alert.  Psychiatric:        Mood and Affect: Mood normal.  Ortho exam demonstrates no effusion in either knee.  She has diminished range of motion consistent with her known history of bilateral knee severe end-stage osteoarthritis.  Both feet are examined.  Patient has 2-3+ pitting edema in both feet.  Slight amount of dorsal erythema on the right foot around the MTP joints.  Pedal pulses palpable.  No tenderness around the calcaneocuboid joint on the right or around the ankle.  No pain with transverse tarsal tibiotalar or subtalar range of motion of either foot.  Assessment/Plan: Impression is right foot MRI scan which shows bony edema within the right  calcaneocuboid  joint.  This is most likely consistent with her known vitamin D deficiency and possible early stress reaction/stress fracture in the right foot.  I do not think this represents any type of septic arthritis or surgical problem particularly given the fact that all of her pain is on the dorsal aspect of the distal foot.  No surgical indication at this time.  Nonetheless her sed rate C-reactive protein remain elevated.  No clear-cut explanation for that at this time in terms of the source.  Patient is currently on IV antibiotics.  I think it would be worthwhile to check a vitamin D level even though should her supplementation started back in late August for vitamin D level of 14.  We will check on her again sometime this weekend.  Landry Dyke Dezi Brauner 08/24/2021, 7:48 PM

## 2021-08-24 NOTE — Progress Notes (Signed)
Attempted LE venous duplex x4, however patient was in MRI, with therapy, with MD, and in chair eating. Will attempt again as schedule permits.  08/24/2021 1:45 PM Kelby Aline., MHA, RVT, RDCS, RDMS

## 2021-08-24 NOTE — Progress Notes (Signed)
PT Cancellation Note  Patient Details Name: [name] MRN: [id number] DOB: 1951-05-13   Cancelled Treatment:    Reason Eval/Treat Not Completed: Patient at procedure or test/unavailable (pt at MRI. Will follow up at later date/time when pt available and as schedule allows.)  Gwynneth Albright PT, DPT Acute Rehabilitation Services Office [phone] Pager [phone]

## 2021-08-25 DIAGNOSIS — G9340 Encephalopathy, unspecified: Secondary | ICD-10-CM | POA: Diagnosis not present

## 2021-08-25 LAB — CBC
HCT: 28.7 % — ABNORMAL LOW (ref 36.0–46.0)
Hemoglobin: 8.9 g/dL — ABNORMAL LOW (ref 12.0–15.0)
MCH: 28.4 pg (ref 26.0–34.0)
MCHC: 31 g/dL (ref 30.0–36.0)
MCV: 91.7 fL (ref 80.0–100.0)
Platelets: 467 10*3/uL — ABNORMAL HIGH (ref 150–400)
RBC: 3.13 MIL/uL — ABNORMAL LOW (ref 3.87–5.11)
RDW: 15.4 % (ref 11.5–15.5)
WBC: 8.9 10*3/uL (ref 4.0–10.5)
nRBC: 0 % (ref 0.0–0.2)

## 2021-08-25 LAB — BASIC METABOLIC PANEL
Anion gap: 7 (ref 5–15)
BUN: 8 mg/dL (ref 8–23)
CO2: 27 mmol/L (ref 22–32)
Calcium: 8.2 mg/dL — ABNORMAL LOW (ref 8.9–10.3)
Chloride: 104 mmol/L (ref 98–111)
Creatinine, Ser: 0.59 mg/dL (ref 0.44–1.00)
GFR, Estimated: >60 mL/min (ref >60)
Glucose, Bld: 111 mg/dL — ABNORMAL HIGH (ref 70–99)
Potassium: 3.7 mmol/L (ref 3.5–5.1)
Sodium: 138 mmol/L (ref 135–145)

## 2021-08-25 LAB — C-REACTIVE PROTEIN: CRP: 10.2 mg/dL — ABNORMAL HIGH (ref <1.0)

## 2021-08-25 LAB — SEDIMENTATION RATE: Sed Rate: 99 mm/hr — ABNORMAL HIGH (ref 0–22)

## 2021-08-25 LAB — VITAMIN D 25 HYDROXY (VIT D DEFICIENCY, FRACTURES): Vit D, 25-Hydroxy: 54.11 ng/mL (ref 30–100)

## 2021-08-25 NOTE — Evaluation (Signed)
Physical Therapy Evaluation Patient Details Name: [name] MRN: [id number] DOB: 1950/12/04 Today's Date: 08/25/2021  History of Present Illness  [name] is a 70 y.o. female presenting with difficulty ambulating, bilateral LE pain and swelling, acute encephalopathy,SIRS. MRI: rigtht ankle--non specific acute findings. 11/23 pt c/o more of right knee pain due to positioning in MRI machine.PHMx: asthma, arthritis, sarcodisosis, depression, and other medical problems.  Clinical Impression   Patient received up in chair, very pleasant but did need encouragement to try gait training today, agreeable overall. Very unsafe with transfers- cannot push from a safe spot due to wrist issues so tends to lean on RW with elbows, also tends to just let herself fall back into a chair when sitting down with no eccentric control. Tolerated short distance gait training today but still limited by pain. Left up in recliner with all needs met, chair alarm active. Agree that she would benefit from short term rehab moving forward.        Recommendations for follow up therapy are one component of a multi-disciplinary discharge planning process, led by the attending physician.  Recommendations may be updated based on patient status, additional functional criteria and insurance authorization.  Follow Up Recommendations Skilled nursing-short term rehab (<3 hours/day)    Assistance Recommended at Discharge Intermittent Supervision/Assistance  Functional Status Assessment Patient has had a recent decline in their functional status and demonstrates the ability to make significant improvements in function in a reasonable and predictable amount of time.  Equipment Recommendations  Rolling walker (2 wheels);BSC/3in1;Wheelchair (measurements PT)    Recommendations for Other Services       Precautions / Restrictions Precautions Precautions: Fall Restrictions Weight Bearing Restrictions: No      Mobility   Bed Mobility               General bed mobility comments: up in recliner upon entry    Transfers Overall transfer level: Needs assistance Equipment used: Rolling walker (2 wheels) Transfers: Sit to/from Stand Sit to Stand: Mod assist           General transfer comment: unable to push up from chair with wrsit extended or with wrist in neutral with closed fist, had to lean on RW with elbows while PT/tech stabilized device; very unsafe stand to sit- tends to just let herself drop into the chair    Ambulation/Gait Ambulation/Gait assistance: Min guard Gait Distance (Feet): 10 Feet Assistive device: Rolling walker (2 wheels) Gait Pattern/deviations: Step-to pattern;Decreased step length - right;Decreased step length - left;Decreased dorsiflexion - right;Decreased dorsiflexion - left;Antalgic;Trendelenburg;Trunk flexed Gait velocity: decreased     General Gait Details: very antalgic but did a good job staying close to [name]; gait distance pain limited, did need reminders to keep hands in appropriate spots on RW  Stairs            Wheelchair Mobility    Modified Rankin (Stroke Patients Only)       Balance Overall balance assessment: Needs assistance Sitting-balance support: No upper extremity supported;Feet supported Sitting balance-Leahy Scale: Good     Standing balance support: Bilateral upper extremity supported;Reliant on assistive device for balance Standing balance-Leahy Scale: Poor                               Pertinent Vitals/Pain Pain Assessment: Faces Faces Pain Scale: Hurts even more Pain Location: right knee Pain Descriptors / Indicators: Aching;Sore Pain Intervention(s): Limited activity within patient's tolerance;Monitored  during session;Repositioned;Premedicated before session    [place] expects to be discharged to:: Skilled nursing facility Living Arrangements: Alone Available Help at Discharge:  Family;Available PRN/intermittently Type of Home: House Home Access: Ramped entrance       Home Layout: One level Home Equipment: Shower seat - built Medical sales representative (2 wheels);Grab bars - tub/shower;Hand held shower head;Grab bars - toilet      Prior Function Prior Level of Function : Independent/Modified Independent                     Hand Dominance   Dominant Hand: Right    Extremity/Trunk Assessment   Upper Extremity Assessment Upper Extremity Assessment: Defer to OT evaluation    Lower Extremity Assessment Lower Extremity Assessment: Generalized weakness    Cervical / Trunk Assessment Cervical / Trunk Assessment: Normal  Communication   Communication: No difficulties  Cognition Arousal/Alertness: Awake/alert Behavior During Therapy: WFL for tasks assessed/performed Overall Cognitive Status: Within Functional Limits for tasks assessed                                 General Comments: polite and cooperative        General Comments      Exercises     Assessment/Plan    PT Assessment Patient needs continued PT services  PT Problem List Decreased strength;Decreased knowledge of use of DME;Obesity;Decreased activity tolerance;Decreased safety awareness;Decreased balance;Pain;Decreased mobility       PT Treatment Interventions DME instruction;Balance training;Gait training;Functional mobility training;Patient/family education;Therapeutic activities;Wheelchair mobility training;Therapeutic exercise    PT Goals (Current goals can be found in the Care Plan section)  Acute Rehab PT Goals Patient Stated Goal: less pain PT Goal Formulation: With patient/family Time For Goal Achievement: 09/08/21 Potential to Achieve Goals: Fair    Frequency Min 2X/week   Barriers to discharge        Co-evaluation               AM-PAC PT "6 Clicks" Mobility  Outcome Measure Help needed turning from your back to your side while in a flat bed  without using bedrails?: A Little Help needed moving from lying on your back to sitting on the side of a flat bed without using bedrails?: A Lot Help needed moving to and from a bed to a chair (including a wheelchair)?: A Lot Help needed standing up from a chair using your arms (e.g., wheelchair or bedside chair)?: A Lot Help needed to walk in hospital room?: A Little Help needed climbing 3-5 steps with a railing? : Total 6 Click Score: 13    End of Session Equipment Utilized During Treatment: Gait belt Activity Tolerance: Patient tolerated treatment well;Patient limited by pain Patient left: in chair;with call bell/phone within reach;with chair alarm set Nurse Communication: Mobility status PT Visit Diagnosis: Unsteadiness on feet (R26.81);Difficulty in walking, not elsewhere classified (R26.2);Muscle weakness (generalized) (M62.81);Pain Pain - Right/Left: Right Pain - part of body: Knee    Time: 3254-9826 PT Time Calculation (min) (ACUTE ONLY): 13 min   Charges:   PT Evaluation $PT Eval Moderate Complexity: 1 Mod         Hadley Detloff U PT, DPT, PN2   Supplemental Physical Therapist [place]    Pager [phone] Acute Rehab Office [phone]

## 2021-08-25 NOTE — Progress Notes (Signed)
PROGRESS NOTE    [name]  MRN:[id number] DOB: 08/21/1951 DOA: 08/23/2021 PCP: Banks, Shannon R, MD    Brief Narrative:  [name] is a 70-year-old female with past medical history significant for asthma, osteoarthritis, sarcoidosis, depression who presented to [facility] ED on 11/22 with difficulty ambulating, bilateral lower extremity pain and swelling.  Patient reports symptoms have been ongoing for roughly 1 month. Patient reports recently has been taking doxycycline and clindamycin for symptoms without relief.  Patient reports difficulty ambulating now causing her to "crawl on the floor" and she cannot walk because of the swelling "makes her feet numb".  She also reports right shoulder pain from a scooting on the floor.  Patient previously use furniture to get around the house and denied any history of using a cane/walker.  Patient follows with orthopedics outpatient, Dr. Dean for her severe osteoarthritis of her knees.  In the ED, temperature 98.6 F, HR 98, RR 22, BP 131/86, SPO2 98% on room air.  Sodium 139, potassium 4.2, chloride 104, CO2 26, glucose 129, BUN 15, creatinine 0.56, AST 59, ALT 30, total bilirubin 0.2.  BNP 13.0.  WBC 9.1, hemoglobin 9.9, platelets of 45.  COVID-19 PCR negative.  Fluenz A/B PCR negative.  Hospital consulted for further evaluation management of intractable knee/foot pain with cellulitis and outpatient failed treatment.   Assessment & Plan:   Principal Problem:   Acute encephalopathy Active Problems:   SARCOIDOSIS   Asthma   Knee pain   Foot pain   Fall   Difficulty in walking   Bilateral lower extremity edema   Hormone replacement therapy   SIRS (systemic inflammatory response syndrome) (HCC)   Acute metabolic encephalopathy, POA: Resolved Etiology likely secondary to medication side effect with narcotics.  UDS negative.  EtOH level less than 10.  CT head without contrast with no acute intracranial pathology, signs of atrophy and  chronic microvascular ischemic changes.  Now resolved and alert and oriented.  Right foot cellulitis Lactic acidosis: Resolved MR right foot with marked subcutaneous soft tissue swelling/edema/fluid involving the entire foot most notably along the dorsum of the foot likely severe cellulitis without discrete drainable soft tissue abscess, marked marrow edema involving the distal calcaneus and adjacent cuboid could be degenerative versus septic arthritis versus osteomyelitis.  Patient reports failed outpatient treatment with doxycycline and clindamycin.  Patient was evaluated by orthopedics, Dr. Dean and surgical foot infection appears unlikely. --Blood cultures x2: no growth <12h --CRP 14.5>10.2 --ESR 85>99 --Lactic Acid 2.2>1.3 --Cefazolin 2 g IV every 8 hours --Supportive care, pain control --CBC, ESR/CRP in am  Chronic pain bilateral knees/feet Patient reports progressive pain to bilateral knees and feet.  Follow-up with orthopedics outpatient, Dr. Dean.  X-ray left/right foot with severe end-stage tricompartmental arthritis and no acute fracture. Left foot x-ray with no acute osseous abnormality.  Right foot x-ray with no acute osseous abnormality. --Oxycodone 5 mg p.o. q4h PRN moderate pain  Bilateral lower extremity edema TTE with LVEF 70-75%, LV with hyperdynamic function, no LV regional wall motion normalities, normal LV diastolic parameters, IVC normal in size. Vascular duplex ultrasound bilateral lower extremities negative for DVT.  Ambulatory dysfunction --PT/OT recommending SNF placement, TOC for evaluation  Asthma --Dulera 200-5 mcg 2 puffs twice daily --duonebs prn   Depression: Fluoxetine 10 mg p.o. daily   DVT prophylaxis: enoxaparin (LOVENOX) injection 40 mg Start: 08/23/21 2200   Code Status: Full Code Family Communication: No family present at bedside this morning  Disposition Plan:    Level of care: Telemetry Status is: Inpatient  Remains inpatient appropriate  because: Continues on IV antibiotics with failed outpatient treatment for right foot cellulitis, PT/OT recommend SNF placement, TOC for evaluation   Consultants:  Orthopedics, Dr. Dean  Procedures:  TTE  Antimicrobials:  Cefazolin 11/23>>    Subjective: Patient seen examined bedside, resting calmly.  Continues with right foot pain.  Also continues to complain of her chronic knee pain.  Seen by orthopedics, no indication for surgical intervention at this time.  PT/OT recommends SNF placement.  No other questions or concerns at this time.  Denies headache, no dizziness, no chest pain, no shortness of breath, no abdominal pain, no weakness, no fatigue, no paresthesias.  No acute events overnight per nursing staff.  Objective: Vitals:   08/24/21 2337 08/25/21 0501 08/25/21 0811 08/25/21 0813  BP:  124/67    Pulse:  100    Resp:  20    Temp: 99.5 F (37.5 C) 99.4 F (37.4 C)    TempSrc: Oral Oral    SpO2:  96% 98% 98%  Weight:      Height:        Intake/Output Summary (Last 24 hours) at 08/25/2021 1222 Last data filed at 08/25/2021 0512 Gross per 24 hour  Intake 560 ml  Output 1100 ml  Net -540 ml   Filed Weights   08/23/21 1948 08/24/21 0500  Weight: 106 kg 100.7 kg    Examination:  General exam: Appears calm and comfortable  Respiratory system: Clear to auscultation. Respiratory effort normal.  On room air Cardiovascular system: S1 & S2 heard, RRR. No JVD, murmurs, rubs, gallops or clicks. No pedal edema. Gastrointestinal system: Abdomen is nondistended, soft and nontender. No organomegaly or masses felt. Normal bowel sounds heard. Central nervous system: Alert and oriented. No focal neurological deficits. Extremities: Tenderness to palpation dorsal surface right foot, normal range of motion and preserved strength Skin: No rashes, lesions or ulcers Psychiatry: Judgement and insight appear normal. Mood & affect appropriate.     Data Reviewed: I have personally  reviewed following labs and imaging studies  CBC: Recent Labs  Lab 08/23/21 0656 08/24/21 0230 08/25/21 0347  WBC 9.1 9.5 8.9  NEUTROABS 5.9  --   --   HGB 9.9* 9.4* 8.9*  HCT 32.1* 29.5* 28.7*  MCV 91.2 92.5 91.7  PLT 485* 426* 467*   Basic Metabolic Panel: Recent Labs  Lab 08/23/21 0656 08/24/21 0230 08/25/21 0347  NA 139 137 138  K 4.2 4.1 3.7  CL 104 104 104  CO2 26 27 27  GLUCOSE 129* 109* 111*  BUN 15 10 8  CREATININE 0.56 0.61 0.59  CALCIUM 9.3 8.2* 8.2*   GFR: Estimated Creatinine Clearance: 70.5 mL/min (by C-G formula based on SCr of 0.59 mg/dL). Liver Function Tests: Recent Labs  Lab 08/23/21 0656 08/24/21 0230  AST 59* 55*  ALT 30 30  ALKPHOS 71 59  BILITOT 0.2* 0.2*  PROT 6.9 5.9*  ALBUMIN 3.3* 2.4*   No results for input(s): LIPASE, AMYLASE in the last 168 hours. Recent Labs  Lab 08/23/21 1057  AMMONIA 20   Coagulation Profile: No results for input(s): INR, PROTIME in the last 168 hours. Cardiac Enzymes: Recent Labs  Lab 08/23/21 1754  CKTOTAL 1,460*   BNP (last 3 results) Recent Labs    08/17/21 1322  PROBNP 3.0   HbA1C: No results for input(s): HGBA1C in the last 72 hours. CBG: No results for input(s): GLUCAP in the last 168   hours. Lipid Profile: No results for input(s): CHOL, HDL, LDLCALC, TRIG, CHOLHDL, LDLDIRECT in the last 72 hours. Thyroid Function Tests: No results for input(s): TSH, T4TOTAL, FREET4, T3FREE, THYROIDAB in the last 72 hours. Anemia Panel: No results for input(s): VITAMINB12, FOLATE, FERRITIN, TIBC, IRON, RETICCTPCT in the last 72 hours. Sepsis Labs: Recent Labs  Lab 08/24/21 0015 08/24/21 0230  LATICACIDVEN 2.2* 1.3    Recent Results (from the past 240 hour(s))  Resp Panel by RT-PCR (Flu A&B, Covid) Nasopharyngeal Swab     Status: None   Collection Time: 08/23/21 11:02 AM   Specimen: Nasopharyngeal Swab; Nasopharyngeal(NP) swabs in vial transport medium  Result Value Ref Range Status   SARS  Coronavirus 2 by RT PCR NEGATIVE NEGATIVE Final    Comment: (NOTE) SARS-CoV-2 target nucleic acids are NOT DETECTED.  The SARS-CoV-2 RNA is generally detectable in upper respiratory specimens during the acute phase of infection. The lowest concentration of SARS-CoV-2 viral copies this assay can detect is 138 copies/mL. A negative result does not preclude SARS-Cov-2 infection and should not be used as the sole basis for treatment or other patient management decisions. A negative result may occur with  improper specimen collection/handling, submission of specimen other than nasopharyngeal swab, presence of viral mutation(s) within the areas targeted by this assay, and inadequate number of viral copies(<138 copies/mL). A negative result must be combined with clinical observations, patient history, and epidemiological information. The expected result is Negative.  Fact Sheet for Patients:  EntrepreneurPulse.com.au  Fact Sheet for Healthcare Providers:  IncredibleEmployment.be  This test is no t yet approved or cleared by the [place] FDA and  has been authorized for detection and/or diagnosis of SARS-CoV-2 by FDA under an Emergency Use Authorization (EUA). This EUA will remain  in effect (meaning this test can be used) for the duration of the COVID-19 declaration under Section 564(b)(1) of the Act, 21 U.S.C.section 360bbb-3(b)(1), unless the authorization is terminated  or revoked sooner.       Influenza A by PCR NEGATIVE NEGATIVE Final   Influenza B by PCR NEGATIVE NEGATIVE Final    Comment: (NOTE) The Xpert Xpress SARS-CoV-2/FLU/RSV plus assay is intended as an aid in the diagnosis of influenza from Nasopharyngeal swab specimens and should not be used as a sole basis for treatment. Nasal washings and aspirates are unacceptable for Xpert Xpress SARS-CoV-2/FLU/RSV testing.  Fact Sheet for  Patients: EntrepreneurPulse.com.au  Fact Sheet for Healthcare Providers: IncredibleEmployment.be  This test is not yet approved or cleared by the [place] FDA and has been authorized for detection and/or diagnosis of SARS-CoV-2 by FDA under an Emergency Use Authorization (EUA). This EUA will remain in effect (meaning this test can be used) for the duration of the COVID-19 declaration under Section 564(b)(1) of the Act, 21 U.S.C. section 360bbb-3(b)(1), unless the authorization is terminated or revoked.  Performed at [facility], [address] [place], [place] [address]   Culture, blood (routine x 2)     Status: None (Preliminary result)   Collection Time: 08/24/21 12:15 AM   Specimen: Right Antecubital; Blood  Result Value Ref Range Status   Specimen Description   Final    RIGHT ANTECUBITAL Performed at [facility] [address], [place], [place] [address]    Special Requests   Final    BOTTLES DRAWN AEROBIC ONLY Blood Culture adequate volume Performed at [facility] [address], [place], [place] [address]    Culture   Final  NO GROWTH < 12 HOURS Performed at [facility], [address] [place], [place] [address]    Report Status PENDING  Incomplete  Culture, blood (routine x 2)     Status: None (Preliminary result)   Collection Time: 08/24/21 12:15 AM   Specimen: Left Antecubital; Blood  Result Value Ref Range Status   Specimen Description   Final    LEFT ANTECUBITAL Performed at [facility], [address] [place], [place] [address]    Special Requests   Final    BOTTLES DRAWN AEROBIC ONLY Blood Culture adequate volume Performed at [facility], [address] [place], [place] [address]    Culture   Final    NO GROWTH < 12 HOURS Performed at [facility], [address] [place], [place] [address]     Report Status PENDING  Incomplete         Radiology Studies: DG Knee 1-2 Views Left  Result Date: 08/24/2021 CLINICAL DATA:  Pain. EXAM: LEFT KNEE - 1-2 VIEW COMPARISON:  None. FINDINGS: There is diffusely decreased mineralization of the bones. No acute fracture or dislocation is seen. Moderate to severe tricompartmental degenerative changes are noted and most pronounced in the patellofemoral compartment. No definite joint effusion or significant soft tissue abnormality. IMPRESSION: 1. No acute fracture or dislocation. 2. Moderate to severe degenerative changes. Electronically Signed   By: Laura  Taylor M.D.   On: 08/24/2021 00:45   DG Knee 1-2 Views Right  Result Date: 08/24/2021 CLINICAL DATA:  Pain. EXAM: RIGHT KNEE - 1-2 VIEW COMPARISON:  None. FINDINGS: There is diffusely decreased mineralization the bones. No acute fracture or dislocation is seen. Severe tricompartmental degenerative changes are noted, most pronounced in the patellofemoral compartment. There is a small suprapatellar joint effusion. IMPRESSION: 1. No acute fracture or dislocation. 2. Severe osteoarthritis. 3. Small suprapatellar joint effusion. Electronically Signed   By: Laura  Taylor M.D.   On: 08/24/2021 00:48   MR FOOT RIGHT WO CONTRAST  Result Date: 08/24/2021 CLINICAL DATA:  Foot pain and swelling. EXAM: MRI OF THE RIGHT FOREFOOT WITHOUT CONTRAST TECHNIQUE: Multiplanar, multisequence MR imaging of the right foot was performed. No intravenous contrast was administered. COMPARISON:  Radiographs 07/30/2021 FINDINGS: Severe subcutaneous soft tissue swelling/edema/fluid involving the entire foot, most notably along the dorsum of the foot. No discrete fluid collection to suggest a drainable soft tissue abscess. This is likely severe cellulitis. Fairly marked marrow edema involving the distal calcaneus and adjacent cuboid. There are degenerative changes at the joint and a small joint effusion. This could be degenerative in  nature but could not exclude the possibility of septic arthritis and osteomyelitis. Recommend clinical correlation with any pain or tenderness in this area. A repeat MRI of the ankle/hindfoot without and with contrast may be helpful for further evaluation of this finding. The other bony structures are intact. No other findings suspicious for septic arthritis or osteomyelitis. There are moderate degenerative changes at the first MTP joint. The major tendons and ligaments appear grossly intact. IMPRESSION: 1. Marked subcutaneous soft tissue swelling/edema/fluid involving the entire foot, most notably along the dorsum of the foot. This is likely severe cellulitis. No discrete drainable soft tissue abscess. 2. Fairly marked marrow edema involving the distal calcaneus and adjacent cuboid. This could be degenerative in nature but could not exclude the possibility of septic arthritis and osteomyelitis. A repeat MRI of the ankle/hindfoot without and with contrast may be helpful for further evaluation of this   finding. Electronically Signed   By: Marijo Sanes M.D.   On: 08/24/2021 07:42   MR ANKLE RIGHT W WO CONTRAST  Result Date: 08/24/2021 CLINICAL DATA:  Ankle pain and swelling. Evaluate for osteoarthritis in right ankle. Osteomyelitis, ankle. EXAM: MRI OF THE RIGHT ANKLE WITHOUT AND WITH CONTRAST TECHNIQUE: Multiplanar, multisequence MR imaging of the ankle was performed before and after the administration of intravenous contrast. CONTRAST:  67m GADAVIST GADOBUTROL 1 MMOL/ML IV SOLN COMPARISON:  Foot radiographs 08/24/2021 and 07/30/2021. Forefoot MRI 08/23/2021. FINDINGS: TENDONS Peroneal: Intact and normally positioned. Posteromedial: Intact and normally positioned. Anterior: Intact and normally positioned. There is a small amount of enhancement surrounding the tibialis anterior tendon in the midfoot which may reflect tenosynovitis. Achilles: Intact. Plantar Fascia: Intact. LIGAMENTS Lateral: The anterior and  posterior talofibular and calcaneofibular ligaments are intact.The inferior tibiofibular ligaments appear intact. Medial: The deltoid and visualized portions of the spring ligament appear intact. CARTILAGE AND BONES Ankle Joint: Small ankle joint effusion without suspicious synovial enhancement. The talar dome and tibial plafond appear normal. Subtalar Joints/Sinus Tarsi: The subtalar joint appears normal. There is a small amount of fluid within the tarsal sinus without suspicious enhancement. Bones: As seen on recent forefoot MRI, there is bone marrow edema within the distal calcaneus and adjacent cuboid adjacent to the calcaneocuboid articulation. There is associated marrow enhancement in these areas. There is a small joint effusion with mild surrounding soft tissue enhancement. There is associated subchondral cyst formation peripherally, but no gross bone destruction. No other osseous abnormalities are identified. Other: Generalized subcutaneous edema surrounding the ankle, extending into dorsum of the foot, similar to recent MRI. No focal fluid collections or other abnormal soft tissue enhancement identified. No evident skin ulceration over the calcaneocuboid articulation. IMPRESSION: IMPRESSION 1. Nonspecific calcaneocuboid arthropathy, unchanged from forefoot MRI done last night. Again, these findings could be degenerative, although a septic joint is not completely excluded. No soft tissue abscess or overlying skin ulceration identified. 2. No other significant arthropathic changes. 3. The ankle tendons and ligaments appear intact. Possible mild anterior tibialis tenosynovitis. 4. Nonspecific generalized subcutaneous edema which could reflect cellulitis. No evidence of soft tissue abscess. Electronically Signed   By: WRichardean SaleM.D.   On: 08/24/2021 09:51   DG Foot 2 Views Left  Result Date: 08/24/2021 CLINICAL DATA:  Pain. EXAM: LEFT FOOT - 2 VIEW COMPARISON:  None. FINDINGS: There is diffusely  decreased mineralization of the bones. No acute fracture or dislocation is seen. Degenerative changes are noted in the midfoot. There is moderate calcaneal spurring. Soft tissue swelling is present over the dorsum of the foot and at the ankle. IMPRESSION: No acute osseous abnormality. Electronically Signed   By: LBrett FairyM.D.   On: 08/24/2021 00:44   DG Foot 2 Views Right  Result Date: 08/24/2021 CLINICAL DATA:  Pain. EXAM: RIGHT FOOT - 2 VIEW COMPARISON:  None. FINDINGS: There is diffusely decreased mineralization of the bones. No acute fracture or dislocation is seen. There is mild hallux valgus deformity with degenerative changes at the first metatarsophalangeal joint. Degenerative changes are noted in the midfoot. There is moderate calcaneal spurring. Soft tissue swelling is noted over the dorsum of the foot and at the ankle. IMPRESSION: No acute osseous abnormality. Electronically Signed   By: LBrett FairyM.D.   On: 08/24/2021 00:43   ECHOCARDIOGRAM COMPLETE  Result Date: 08/24/2021    ECHOCARDIOGRAM REPORT   Patient Name:   WLABELLA ZAHRADNIKDate of Exam: 08/24/2021 Medical Rec #:  [id number]        Height:       62.0 in Accession #:    [id number]       Weight:       222.0 lb Date of Birth:  12/21/1950        BSA:          1.998 m Patient Age:    78 years         BP:           136/56 mmHg Patient Gender: F                HR:           100 bpm. Exam Location:  Inpatient Procedure: 2D Echo, Cardiac Doppler and Color Doppler Indications:    Edema  History:        Patient has prior history of Echocardiogram examinations, most                 recent 10/10/2016.  Sonographer:    Bernadene Person RDCS Referring Phys: [phone] A CALDWELL [place]  1. Left ventricular ejection fraction, by estimation, is 70 to 75%. The left ventricle has hyperdynamic function. The left ventricle has no regional wall motion abnormalities. There is mild left ventricular hypertrophy. Left ventricular diastolic  parameters were normal.  2. Right ventricular systolic function is normal. The right ventricular size is normal. Tricuspid regurgitation signal is inadequate for assessing PA pressure.  3. The mitral valve is normal in structure. No evidence of mitral valve regurgitation.  4. The aortic valve was not well visualized. Aortic valve regurgitation is not visualized. No aortic stenosis is present.  5. The inferior vena cava is normal in size with <50% respiratory variability, suggesting right atrial pressure of 8 mmHg. FINDINGS  Left Ventricle: Left ventricular ejection fraction, by estimation, is 70 to 75%. The left ventricle has hyperdynamic function. The left ventricle has no regional wall motion abnormalities. The left ventricular internal cavity size was normal in size. There is mild left ventricular hypertrophy. Left ventricular diastolic parameters were normal. Right Ventricle: The right ventricular size is normal. No increase in right ventricular wall thickness. Right ventricular systolic function is normal. Tricuspid regurgitation signal is inadequate for assessing PA pressure. Left Atrium: Left atrial size was normal in size. Right Atrium: Right atrial size was normal in size. Pericardium: There is no evidence of pericardial effusion. Mitral Valve: The mitral valve is normal in structure. No evidence of mitral valve regurgitation. Tricuspid Valve: The tricuspid valve is normal in structure. Tricuspid valve regurgitation is trivial. Aortic Valve: The aortic valve was not well visualized. Aortic valve regurgitation is not visualized. No aortic stenosis is present. Pulmonic Valve: The pulmonic valve was not well visualized. Pulmonic valve regurgitation is not visualized. Aorta: The aortic root and ascending aorta are structurally normal, with no evidence of dilitation. Venous: The inferior vena cava is normal in size with less than 50% respiratory variability, suggesting right atrial pressure of 8 mmHg.  IAS/Shunts: The interatrial septum was not well visualized.  LEFT VENTRICLE PLAX 2D LVIDd:         4.50 cm   Diastology LVIDs:         2.80 cm   LV e' medial:    9.25 cm/s LV PW:         1.20 cm   LV E/e' medial:  11.7 LV IVS:        1.00 cm   LV e' lateral:  10.60 cm/s LVOT diam:     1.90 cm   LV E/e' lateral: 10.2 LV SV:         60 LV SV Index:   30 LVOT Area:     2.84 cm  RIGHT VENTRICLE RV S prime:     15.90 cm/s TAPSE (M-mode): 1.8 cm LEFT ATRIUM             Index        RIGHT ATRIUM           Index LA diam:        3.30 cm 1.65 cm/m   RA Area:     11.70 cm LA Vol (A2C):   39.6 ml 19.82 ml/m  RA Volume:   28.30 ml  14.16 ml/m LA Vol (A4C):   31.0 ml 15.51 ml/m LA Biplane Vol: 37.0 ml 18.51 ml/m  AORTIC VALVE LVOT Vmax:   135.00 cm/s LVOT Vmean:  89.600 cm/s LVOT VTI:    0.210 m  AORTA Ao Root diam: 3.00 cm Ao Asc diam:  3.00 cm MITRAL VALVE MV Area (PHT): 5.02 cm     SHUNTS MV Decel Time: 151 msec     Systemic VTI:  0.21 m MV E velocity: 108.00 cm/s  Systemic Diam: 1.90 cm MV A velocity: 149.00 cm/s MV E/A ratio:  0.72 Christopher Schumann MD Electronically signed by Christopher Schumann MD Signature Date/Time: 08/24/2021/2:14:29 PM    Final    VAS US LOWER EXTREMITY VENOUS (DVT)  Result Date: 08/25/2021  Lower Venous DVT Study Patient Name:  Aribelle B Fitch  Date of Exam:   08/24/2021 Medical Rec #: [id number]         Accession #:    [id number] Date of Birth: 05/06/1951         Patient Gender: F Patient Age:   70 years Exam Location:  [facility] Procedure:      VAS US LOWER EXTREMITY VENOUS (DVT) Referring Phys: A POWELL JR --------------------------------------------------------------------------------  Indications: Edema.  Limitations: Poor ultrasound/tissue interface and body habitus. Comparison Study: No prior study Performing Technologist: Michelle Simonetti MHA, RDMS, RVT, RDCS  Examination Guidelines: A complete evaluation includes B-mode imaging, spectral Doppler, color Doppler,  and power Doppler as needed of all accessible portions of each vessel. Bilateral testing is considered an integral part of a complete examination. Limited examinations for reoccurring indications may be performed as noted. The reflux portion of the exam is performed with the patient in reverse Trendelenburg.  +---------+---------------+---------+-----------+----------+--------------+ RIGHT    CompressibilityPhasicitySpontaneityPropertiesThrombus Aging +---------+---------------+---------+-----------+----------+--------------+ CFV      Full           Yes      Yes                                 +---------+---------------+---------+-----------+----------+--------------+ SFJ      Full                                                        +---------+---------------+---------+-----------+----------+--------------+ FV Prox  Full                                                        +---------+---------------+---------+-----------+----------+--------------+   FV Mid   Full                                                        +---------+---------------+---------+-----------+----------+--------------+ FV DistalFull                                                        +---------+---------------+---------+-----------+----------+--------------+ PFV      Full                                                        +---------+---------------+---------+-----------+----------+--------------+ POP      Full           Yes      Yes                                 +---------+---------------+---------+-----------+----------+--------------+ PTV      Full                                                        +---------+---------------+---------+-----------+----------+--------------+ PERO     Full                                                        +---------+---------------+---------+-----------+----------+--------------+    +---------+---------------+---------+-----------+----------+--------------+ LEFT     CompressibilityPhasicitySpontaneityPropertiesThrombus Aging +---------+---------------+---------+-----------+----------+--------------+ CFV      Full           Yes      Yes                                 +---------+---------------+---------+-----------+----------+--------------+ SFJ      Full                                                        +---------+---------------+---------+-----------+----------+--------------+ FV Prox  Full                                                        +---------+---------------+---------+-----------+----------+--------------+ FV Mid   Full                                                        +---------+---------------+---------+-----------+----------+--------------+   FV DistalFull                                                        +---------+---------------+---------+-----------+----------+--------------+ PFV      Full                                                        +---------+---------------+---------+-----------+----------+--------------+ POP      Full           Yes      Yes                                 +---------+---------------+---------+-----------+----------+--------------+ PTV      Full                                                        +---------+---------------+---------+-----------+----------+--------------+ PERO     Full                                                        +---------+---------------+---------+-----------+----------+--------------+     Summary: BILATERAL: - No evidence of deep vein thrombosis seen in the lower extremities, bilaterally. -No evidence of popliteal cyst, bilaterally.   *See table(s) above for measurements and observations. Electronically signed by Vance Brabham MD on 08/25/2021 at 12:50:34 AM.    Final         Scheduled Meds:  cycloSPORINE  1 drop Both Eyes  BID   diclofenac Sodium  2 g Topical QID   enoxaparin (LOVENOX) injection  40 mg Subcutaneous Q24H   estradiol  1 mg Oral Daily   FLUoxetine  10 mg Oral Daily   fluticasone  1 spray Each Nare Daily   ipratropium-albuterol  3 mL Nebulization BID   loratadine  5 mg Oral QPM   mometasone-formoterol  2 puff Inhalation BID   Continuous Infusions:   ceFAZolin (ANCEF) IV 2 g (08/25/21 0512)     LOS: 1 day    Time spent: 39 minutes spent on chart review, discussion with nursing staff, consultants, updating family and interview/physical exam; more than 50% of that time was spent in counseling and/or coordination of care.     J , DO [facility] Available via Epic secure chat 7am-7pm After these hours, please refer to coverage provider listed on [email or web address] 08/25/2021, 12:22 PM   

## 2021-08-26 DIAGNOSIS — G9340 Encephalopathy, unspecified: Secondary | ICD-10-CM | POA: Diagnosis not present

## 2021-08-26 MED ORDER — ALUM & MAG HYDROXIDE-SIMETH 200-200-20 MG/5ML PO SUSP
30.0000 mL | Freq: Four times a day (QID) | ORAL | Status: DC | PRN
  Administered 2021-08-26: 30 mL via ORAL
  Filled 2021-08-26: qty 30

## 2021-08-26 NOTE — Progress Notes (Signed)
Patient's nephew [name] (son of pt's sister who is in her chart as a contact). Per nephew, he and his mother are very concerned about pt's safety upon returning home.  Per nephew, pt has trash and belongings piled up around house.  Nephew provided pictures of the interior of a house with numerous pizza boxes, large garbage bags, and other items piled up throughout the house.   Per nephew, patient also has bills "unpaid for several months," to which she responded by cutting off water and continuing to use the toilet. 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   Notified MD and LCSW to initiate APS report per MD.  Angie Fava, RN

## 2021-08-26 NOTE — TOC Progression Note (Addendum)
Transition of Care [facility]) - Progression Note    Patient Details  Name: [name] MRN: [id number] Date of Birth: 08-Sep-1951  [facility] CM/SW Contact  Servando Snare, [place] Phone Number: 08/26/2021, 12:26 PM  Clinical Narrative:   Fl2 completed and patient faxed out. Family has concerns with patient living conditions. Patient dc to SNF from hospital. Family to follow up with SNF for dc concerns when patient is ready to dc home. PASRR pending. Patient needs insurance auth.     Expected Discharge Plan: [place] Barriers to Discharge: Continued Medical Work up  Expected Discharge Plan and Services Expected Discharge Plan: [place]   Discharge Planning Services: CM Consult   Living arrangements for the past 2 months: Single Family Home                                       Social Determinants of Health (SDOH) Interventions    Readmission Risk Interventions No flowsheet data found.

## 2021-08-26 NOTE — NC FL2 (Signed)
[place] LEVEL OF CARE SCREENING TOOL     IDENTIFICATION  Patient Name: [name] Birthdate: 1951/03/02 Sex: female Admission Date (Current Location): 08/23/2021  [facility] and [place] Number:  Herbalist and Address:  [facility],  [place], [place]      Provider Number: [id number]  Attending Physician Name and Address:  [place] Eric J, DO  Relative Name and Phone Number:  Maylon Peppers, sister, [phone]    Current Level of Care: Hospital Recommended Level of Care: [place] Prior Approval Number:    Date Approved/Denied:   PASRR Number: pending  Discharge Plan: SNF    Current Diagnoses: Patient Active Problem List   Diagnosis Date Noted   Acute encephalopathy 08/23/2021   Fall 08/23/2021   Difficulty in walking 08/23/2021   Bilateral lower extremity edema 08/23/2021   Hormone replacement therapy 08/23/2021   SIRS (systemic inflammatory response syndrome) ([place]) 08/23/2021   Osteoporosis 01/28/2021   Osteoarthritis of right wrist 01/28/2021   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   Foot pain 09/13/2020   Knee pain 08/24/2018   Fall in home 07/14/2015   TIBIALIS TENDINITIS 08/25/2009   CHEST PAIN, OTHER, PAIN 10/21/2008   PEDAL EDEMA 07/22/2008   ANKLE PAIN, BILATERAL 04/01/2008   LOW BACK PAIN 04/01/2008   ACUTE BRONCHITIS 09/23/2007   SARCOIDOSIS 09/02/2007   Asthma 09/02/2007   Diverticulosis of colon 07/11/2007   BACK PAIN, CHRONIC 07/11/2007    Orientation RESPIRATION BLADDER Height & Weight     Self, Time, Situation, Place  Normal Continent Weight: 222 lb 0.1 oz (100.7 kg) Height:  5\' 2"  (157.5 cm)  BEHAVIORAL SYMPTOMS/MOOD NEUROLOGICAL BOWEL NUTRITION STATUS      Continent Diet (see dc plan)  AMBULATORY STATUS COMMUNICATION OF NEEDS Skin   Limited Assist Verbally Normal                       Personal Care Assistance Level of Assistance  Bathing, Feeding,  Dressing Bathing Assistance: Limited assistance Feeding assistance: Limited assistance Dressing Assistance: Limited assistance     Functional Limitations Info  Sight, Hearing, Speech Sight Info: Adequate Hearing Info: Impaired Speech Info: Adequate    SPECIAL CARE FACTORS FREQUENCY  PT (By licensed PT)     PT Frequency: 5x week              Contractures Contractures Info: Not present    Additional Factors Info  Code Status Code Status Info: Full             Current Medications (08/26/2021):  This is the current hospital active medication list Current Facility-Administered Medications  Medication Dose Route Frequency Provider Last Rate Last Admin   acetaminophen (TYLENOL) tablet 650 mg  650 mg Oral Q6H PRN Elodia Florence., MD   650 mg at 08/26/21 1018   Or   acetaminophen (TYLENOL) suppository 650 mg  650 mg Rectal Q6H PRN Elodia Florence., MD       ceFAZolin (ANCEF) IVPB 2g/100 mL premix  2 g Intravenous Q8H Elodia Florence., MD 200 mL/hr at 08/26/21 4782 2 g at 08/26/21 9562   cycloSPORINE (RESTASIS) 0.05 % ophthalmic emulsion 1 drop  1 drop Both Eyes BID Elodia Florence., MD   1 drop at 08/26/21 1007   diclofenac Sodium (VOLTAREN) 1 % topical gel 2 g  2 g Topical QID Elodia Florence., MD   2 g at  08/26/21 1008   enoxaparin (LOVENOX) injection 40 mg  40 mg Subcutaneous Q24H Elodia Florence., MD   40 mg at 08/25/21 2142   estradiol (ESTRACE) tablet 1 mg  1 mg Oral Daily Elodia Florence., MD   1 mg at 08/26/21 1008   FLUoxetine (PROZAC) capsule 10 mg  10 mg Oral Daily Elodia Florence., MD   10 mg at 08/26/21 1008   fluticasone (FLONASE) 50 MCG/ACT nasal spray 1 spray  1 spray Each Nare Daily Elodia Florence., MD   1 spray at 08/26/21 1027   ipratropium-albuterol (DUONEB) 0.5-2.5 (3) MG/3ML nebulizer solution 3 mL  3 mL Nebulization Q6H PRN [place] Donnamarie Poag, DO   3 mL at 08/24/21 2043   ipratropium-albuterol (DUONEB)  0.5-2.5 (3) MG/3ML nebulizer solution 3 mL  3 mL Nebulization BID [place] Eric J, DO   3 mL at 08/26/21 5672   loratadine (CLARITIN) tablet 5 mg  5 mg Oral QPM Elodia Florence., MD   5 mg at 08/25/21 1832   mometasone-formoterol (DULERA) 200-5 MCG/ACT inhaler 2 puff  2 puff Inhalation BID Elodia Florence., MD   2 puff at 08/26/21 0919   morphine 2 MG/ML injection 2 mg  2 mg Intravenous Q3H PRN [place] Eric J, DO   2 mg at 08/24/21 1220   oxyCODONE (Oxy IR/ROXICODONE) immediate release tablet 5 mg  5 mg Oral Q4H PRN Elodia Florence., MD   5 mg at 08/26/21 8022     Discharge Medications: Please see discharge summary for a list of discharge medications.  Relevant Imaging Results:  Relevant Lab Results:   Additional Information SSN: [id number], LCSW

## 2021-08-26 NOTE — Progress Notes (Signed)
PROGRESS NOTE    [name]  GBT:[id number] DOB: January 05, 1951 DOA: 08/23/2021 PCP: [name], MD    Brief Narrative:  [name] is a 70 year old female with past medical history significant for asthma, osteoarthritis, sarcoidosis, depression who presented to [facility] ED on 11/22 with difficulty ambulating, bilateral lower extremity pain and swelling.  Patient reports symptoms have been ongoing for roughly 1 month. Patient reports recently has been taking doxycycline and clindamycin for symptoms without relief.  Patient reports difficulty ambulating now causing her to "crawl on the floor" and she cannot walk because of the swelling "makes her feet numb".  She also reports right shoulder pain from a scooting on the floor.  Patient previously use furniture to get around the house and denied any history of using a cane/walker.  Patient follows with orthopedics outpatient, Dr. Marlou Sa for her severe osteoarthritis of her knees.  In the ED, temperature 98.6 F, HR 98, RR 22, BP 131/86, SPO2 98% on room air.  Sodium 139, potassium 4.2, chloride 104, CO2 26, glucose 129, BUN 15, creatinine 0.56, AST 59, ALT 30, total bilirubin 0.2.  BNP 13.0.  WBC 9.1, hemoglobin 9.9, platelets of 45.  COVID-19 PCR negative.  Fluenz A/B PCR negative.  Hospital consulted for further evaluation management of intractable knee/foot pain with cellulitis and outpatient failed treatment.   Assessment & Plan:   Principal Problem:   Acute encephalopathy Active Problems:   SARCOIDOSIS   Asthma   Knee pain   Foot pain   Fall   Difficulty in walking   Bilateral lower extremity edema   Hormone replacement therapy   SIRS (systemic inflammatory response syndrome) (HCC)   Acute metabolic encephalopathy, POA: Resolved Etiology likely secondary to medication side effect with narcotics.  UDS negative.  EtOH level less than 10.  CT head without contrast with no acute intracranial pathology, signs of atrophy and  chronic microvascular ischemic changes.  Now resolved and alert and oriented.  Right foot cellulitis Lactic acidosis: Resolved MR right foot with marked subcutaneous soft tissue swelling/edema/fluid involving the entire foot most notably along the dorsum of the foot likely severe cellulitis without discrete drainable soft tissue abscess, marked marrow edema involving the distal calcaneus and adjacent cuboid could be degenerative versus septic arthritis versus osteomyelitis.  Patient reports failed outpatient treatment with doxycycline and clindamycin.  Patient was evaluated by orthopedics, Dr. Marlou Sa and surgical foot infection appears unlikely. --Blood cultures x2: no growth x 1 day --CRP 14.5>10.2 --ESR 85>99 --Lactic Acid 2.2>1.3 --Cefazolin 2 g IV every 8 hours --Supportive care, pain control  Chronic pain bilateral knees/feet Patient reports progressive pain to bilateral knees and feet.  Follow-up with orthopedics outpatient, Dr. Marlou Sa.  X-ray left/right foot with severe end-stage tricompartmental arthritis and no acute fracture. Left foot x-ray with no acute osseous abnormality.  Right foot x-ray with no acute osseous abnormality. --Oxycodone 5 mg p.o. q4h PRN moderate pain --Outpatient follow-up with orthopedics  Bilateral lower extremity edema TTE with LVEF 70-75%, LV with hyperdynamic function, no LV regional wall motion normalities, normal LV diastolic parameters, IVC normal in size. Vascular duplex ultrasound bilateral lower extremities negative for DVT.  Ambulatory dysfunction --PT/OT recommending SNF, TOC for placement  Asthma --Dulera 200-5 mcg 2 puffs twice daily --duonebs prn   Depression: Fluoxetine 10 mg p.o. daily  Ethics: Family concerned about patient's safety upon returning home as there is poor living conditions reported.  Social work aware for further evaluation.   DVT prophylaxis: enoxaparin (LOVENOX)  injection 40 mg Start: 08/23/21 2200   Code Status: Full  Code Family Communication: No family present at bedside this morning  Disposition Plan:  Level of care: Telemetry Status is: Inpatient  Remains inpatient appropriate because: Continues on IV antibiotics with failed outpatient treatment for right foot cellulitis, PT/OT recommend SNF placement, TOC for evaluation   Consultants:  Orthopedics, Dr. Marlou Sa  Procedures:  TTE  Antimicrobials:  Cefazolin 11/23>>    Subjective: Patient seen examined bedside, resting calmly.  Patient reports pain improved.  PT/OT currently recommending SNF placement, patient agreeable.  Nursing reports that family states that her living conditions is currently poor and concerned about her safety at home.  No other questions or concerns at this time.  Denies headache, no dizziness, no chest pain, no shortness of breath, no abdominal pain, no weakness, no fatigue, no paresthesias.  No acute events overnight per nursing staff.  Objective: Vitals:   08/25/21 2051 08/26/21 0500 08/26/21 0510 08/26/21 0833  BP: 122/65  131/60   Pulse: (!) 102  100   Resp: 20  20   Temp: 99.6 F (37.6 C)  99.7 F (37.6 C)   TempSrc: Oral  Oral   SpO2: 98%  95% 95%  Weight:  100.7 kg    Height:        Intake/Output Summary (Last 24 hours) at 08/26/2021 1051 Last data filed at 08/26/2021 1006 Gross per 24 hour  Intake 200 ml  Output 500 ml  Net -300 ml   Filed Weights   08/23/21 1948 08/24/21 0500 08/26/21 0500  Weight: 106 kg 100.7 kg 100.7 kg    Examination:  General exam: Appears calm and comfortable  Respiratory system: Clear to auscultation. Respiratory effort normal.  On room air Cardiovascular system: S1 & S2 heard, RRR. No JVD, murmurs, rubs, gallops or clicks. No pedal edema. Gastrointestinal system: Abdomen is nondistended, soft and nontender. No organomegaly or masses felt. Normal bowel sounds heard. Central nervous system: Alert and oriented. No focal neurological deficits. Extremities: Tenderness to  palpation dorsal surface right foot, normal range of motion and preserved strength Skin: No rashes, lesions or ulcers Psychiatry: Judgement and insight appear normal. Mood & affect appropriate.     Data Reviewed: I have personally reviewed following labs and imaging studies  CBC: Recent Labs  Lab 08/23/21 0656 08/24/21 0230 08/25/21 0347  WBC 9.1 9.5 8.9  NEUTROABS 5.9  --   --   HGB 9.9* 9.4* 8.9*  HCT 32.1* 29.5* 28.7*  MCV 91.2 92.5 91.7  PLT 485* 426* 202*   Basic Metabolic Panel: Recent Labs  Lab 08/23/21 0656 08/24/21 0230 08/25/21 0347  NA 139 137 138  K 4.2 4.1 3.7  CL 104 104 104  CO2 $Re'26 27 27  'sCL$ GLUCOSE 129* 109* 111*  BUN $Re'15 10 8  'QFx$ CREATININE 0.56 0.61 0.59  CALCIUM 9.3 8.2* 8.2*   GFR: Estimated Creatinine Clearance: 70.5 mL/min (by C-G formula based on SCr of 0.59 mg/dL). Liver Function Tests: Recent Labs  Lab 08/23/21 0656 08/24/21 0230  AST 59* 55*  ALT 30 30  ALKPHOS 71 59  BILITOT 0.2* 0.2*  PROT 6.9 5.9*  ALBUMIN 3.3* 2.4*   No results for input(s): LIPASE, AMYLASE in the last 168 hours. Recent Labs  Lab 08/23/21 1057  AMMONIA 20   Coagulation Profile: No results for input(s): INR, PROTIME in the last 168 hours. Cardiac Enzymes: Recent Labs  Lab 08/23/21 1754  CKTOTAL 1,460*   BNP (last 3 results) Recent Labs  08/17/21 1322  PROBNP 3.0   HbA1C: No results for input(s): HGBA1C in the last 72 hours. CBG: No results for input(s): GLUCAP in the last 168 hours. Lipid Profile: No results for input(s): CHOL, HDL, LDLCALC, TRIG, CHOLHDL, LDLDIRECT in the last 72 hours. Thyroid Function Tests: No results for input(s): TSH, T4TOTAL, FREET4, T3FREE, THYROIDAB in the last 72 hours. Anemia Panel: No results for input(s): VITAMINB12, FOLATE, FERRITIN, TIBC, IRON, RETICCTPCT in the last 72 hours. Sepsis Labs: Recent Labs  Lab 08/24/21 0015 08/24/21 0230  LATICACIDVEN 2.2* 1.3    Recent Results (from the past 240 hour(s))  Resp  Panel by RT-PCR (Flu A&B, Covid) Nasopharyngeal Swab     Status: None   Collection Time: 08/23/21 11:02 AM   Specimen: Nasopharyngeal Swab; Nasopharyngeal(NP) swabs in vial transport medium  Result Value Ref Range Status   SARS Coronavirus 2 by RT PCR NEGATIVE NEGATIVE Final    Comment: (NOTE) SARS-CoV-2 target nucleic acids are NOT DETECTED.  The SARS-CoV-2 RNA is generally detectable in upper respiratory specimens during the acute phase of infection. The lowest concentration of SARS-CoV-2 viral copies this assay can detect is 138 copies/mL. A negative result does not preclude SARS-Cov-2 infection and should not be used as the sole basis for treatment or other patient management decisions. A negative result may occur with  improper specimen collection/handling, submission of specimen other than nasopharyngeal swab, presence of viral mutation(s) within the areas targeted by this assay, and inadequate number of viral copies(<138 copies/mL). A negative result must be combined with clinical observations, patient history, and epidemiological information. The expected result is Negative.  Fact Sheet for Patients:  EntrepreneurPulse.com.au  Fact Sheet for Healthcare Providers:  IncredibleEmployment.be  This test is no t yet approved or cleared by the [place] FDA and  has been authorized for detection and/or diagnosis of SARS-CoV-2 by FDA under an Emergency Use Authorization (EUA). This EUA will remain  in effect (meaning this test can be used) for the duration of the COVID-19 declaration under Section 564(b)(1) of the Act, 21 U.S.C.section 360bbb-3(b)(1), unless the authorization is terminated  or revoked sooner.       Influenza A by PCR NEGATIVE NEGATIVE Final   Influenza B by PCR NEGATIVE NEGATIVE Final    Comment: (NOTE) The Xpert Xpress SARS-CoV-2/FLU/RSV plus assay is intended as an aid in the diagnosis of influenza from Nasopharyngeal  swab specimens and should not be used as a sole basis for treatment. Nasal washings and aspirates are unacceptable for Xpert Xpress SARS-CoV-2/FLU/RSV testing.  Fact Sheet for Patients: EntrepreneurPulse.com.au  Fact Sheet for Healthcare Providers: IncredibleEmployment.be  This test is not yet approved or cleared by the [place] FDA and has been authorized for detection and/or diagnosis of SARS-CoV-2 by FDA under an Emergency Use Authorization (EUA). This EUA will remain in effect (meaning this test can be used) for the duration of the COVID-19 declaration under Section 564(b)(1) of the Act, 21 U.S.C. section 360bbb-3(b)(1), unless the authorization is terminated or revoked.  Performed at [facility], [address] [place], [place] [address]   Culture, blood (routine x 2)     Status: None (Preliminary result)   Collection Time: 08/24/21 12:15 AM   Specimen: Right Antecubital; Blood  Result Value Ref Range Status   Specimen Description   Final    RIGHT ANTECUBITAL Performed at [facility] [address] [place], [place] [address]    Special Requests   Final    BOTTLES DRAWN  AEROBIC ONLY Blood Culture adequate volume Performed at [facility] [address], [place], [place] [address]    Culture   Final    NO GROWTH 1 DAY Performed at [facility], [place] [address] [place], [place] [address]    Report Status PENDING  Incomplete  Culture, blood (routine x 2)     Status: None (Preliminary result)   Collection Time: 08/24/21 12:15 AM   Specimen: Left Antecubital; Blood  Result Value Ref Range Status   Specimen Description   Final    LEFT ANTECUBITAL Performed at [facility] [address] [place], [place] [address]    Special Requests   Final    BOTTLES DRAWN AEROBIC ONLY Blood Culture adequate volume Performed at [facility] [address] [place], [place] [address]    Culture   Final    NO GROWTH 1 DAY Performed at [facility], [place] [address] [place], [place] [address]    Report Status PENDING  Incomplete         Radiology Studies: VAS [name] LOWER EXTREMITY VENOUS (DVT)  Result Date: 08/25/2021  Lower Venous DVT Study Patient Name:  HAIZLEY CANNELLA  Date of Exam:   08/24/2021 Medical Rec #: [id number]         Accession #:    [id number] Date of Birth: 06-17-1951         Patient Gender: F Patient Age:   63 years Exam Location:  [facility] Procedure:      VAS [name] LOWER EXTREMITY VENOUS (DVT) Referring Phys: A POWELL JR --------------------------------------------------------------------------------  Indications: Edema.  Limitations: Poor ultrasound/tissue interface and body habitus. Comparison Study: No prior study Performing Technologist: Maudry Mayhew MHA, RDMS, RVT, RDCS  Examination Guidelines: A complete evaluation includes B-mode imaging, spectral Doppler, color Doppler, and power Doppler as needed of all accessible portions of each vessel. Bilateral testing is considered an integral part of a complete examination. Limited examinations for reoccurring indications may be performed as noted. The reflux portion of the exam is performed with the patient in reverse Trendelenburg.  +---------+---------------+---------+-----------+----------+--------------+ RIGHT    CompressibilityPhasicitySpontaneityPropertiesThrombus Aging +---------+---------------+---------+-----------+----------+--------------+ CFV      Full           Yes      Yes                                 +---------+---------------+---------+-----------+----------+--------------+ SFJ      Full                                                        +---------+---------------+---------+-----------+----------+--------------+ FV Prox  Full                                                         +---------+---------------+---------+-----------+----------+--------------+ FV Mid   Full                                                        +---------+---------------+---------+-----------+----------+--------------+  FV DistalFull                                                        +---------+---------------+---------+-----------+----------+--------------+ PFV      Full                                                        +---------+---------------+---------+-----------+----------+--------------+ POP      Full           Yes      Yes                                 +---------+---------------+---------+-----------+----------+--------------+ PTV      Full                                                        +---------+---------------+---------+-----------+----------+--------------+ PERO     Full                                                        +---------+---------------+---------+-----------+----------+--------------+   +---------+---------------+---------+-----------+----------+--------------+ LEFT     CompressibilityPhasicitySpontaneityPropertiesThrombus Aging +---------+---------------+---------+-----------+----------+--------------+ CFV      Full           Yes      Yes                                 +---------+---------------+---------+-----------+----------+--------------+ SFJ      Full                                                        +---------+---------------+---------+-----------+----------+--------------+ FV Prox  Full                                                        +---------+---------------+---------+-----------+----------+--------------+ FV Mid   Full                                                        +---------+---------------+---------+-----------+----------+--------------+ FV DistalFull                                                         +---------+---------------+---------+-----------+----------+--------------+  PFV      Full                                                        +---------+---------------+---------+-----------+----------+--------------+ POP      Full           Yes      Yes                                 +---------+---------------+---------+-----------+----------+--------------+ PTV      Full                                                        +---------+---------------+---------+-----------+----------+--------------+ PERO     Full                                                        +---------+---------------+---------+-----------+----------+--------------+     Summary: BILATERAL: - No evidence of deep vein thrombosis seen in the lower extremities, bilaterally. -No evidence of popliteal cyst, bilaterally.   *See table(s) above for measurements and observations. Electronically signed by Harold Barban MD on 08/25/2021 at 12:50:34 AM.    Final         Scheduled Meds:  cycloSPORINE  1 drop Both Eyes BID   diclofenac Sodium  2 g Topical QID   enoxaparin (LOVENOX) injection  40 mg Subcutaneous Q24H   estradiol  1 mg Oral Daily   FLUoxetine  10 mg Oral Daily   fluticasone  1 spray Each Nare Daily   ipratropium-albuterol  3 mL Nebulization BID   loratadine  5 mg Oral QPM   mometasone-formoterol  2 puff Inhalation BID   Continuous Infusions:   ceFAZolin (ANCEF) IV 2 g (08/26/21 0609)     LOS: 2 days    Time spent: 36 minutes spent on chart review, discussion with nursing staff, consultants, updating family and interview/physical exam; more than 50% of that time was spent in counseling and/or coordination of care.    Tequita Marrs J [place] DO [facility] Available via Epic secure chat 7am-7pm After these hours, please refer to coverage provider listed on [email or web address] 08/26/2021, 10:51 AM

## 2021-08-27 DIAGNOSIS — L8990 Pressure ulcer of unspecified site, unspecified stage: Secondary | ICD-10-CM | POA: Diagnosis present

## 2021-08-27 DIAGNOSIS — G9340 Encephalopathy, unspecified: Secondary | ICD-10-CM | POA: Diagnosis not present

## 2021-08-27 MED ORDER — FLUOXETINE HCL 20 MG PO CAPS
20.0000 mg | ORAL_CAPSULE | Freq: Every day | ORAL | Status: DC
  Administered 2021-08-28 – 2021-08-31 (×4): 20 mg via ORAL
  Filled 2021-08-27 (×4): qty 1

## 2021-08-27 MED ORDER — QUETIAPINE FUMARATE 25 MG PO TABS
12.5000 mg | ORAL_TABLET | Freq: Every day | ORAL | Status: DC
  Administered 2021-08-27 – 2021-08-30 (×4): 12.5 mg via ORAL
  Filled 2021-08-27 (×4): qty 1

## 2021-08-27 NOTE — Consult Note (Addendum)
[facility] Consult   Reason for Consult:  Please eval for capacity vs underlying psychiatric disorder leading to her current living conditions. Recommend discussion with patients nephew and sister for complete background Referring Physician:  Dr. [place] Patient Identification: [name] MRN:  [id number] Principal Diagnosis: Acute encephalopathy Diagnosis:  Principal Problem:   Acute encephalopathy Active Problems:   SARCOIDOSIS   Asthma   Knee pain   Foot pain   Fall   Difficulty in walking   Bilateral lower extremity edema   Hormone replacement therapy   SIRS (systemic inflammatory response syndrome) (HCC)   Pressure injury of skin   Total Time spent with patient: 45 minutes  Subjective:   [name] is a 70 y.o. female patient admitted with bilateral cellulitis and acute metabolic encephalopathy. CT scan of head shows atrophy and chronic microvascular ischemic changes. Psych is consulted for capacity evaluation vs underlying psychiatric disorder leading to her current living conditions. Patient is seen and assessed by this nurse practitioner, sister Truman Hayward) and nephewMortimer Fries) are both found at the bedside. Consent is obtained to proceed with psychiatric evaluation in their presence, as well as allow for participation. Discussed with patient reason for consult, in whic she agreed to participate and move forward. Initally completed capacity evalaution, will place in a separate note. Psychiatric history is obtained, in which patient denies any pre-existing psychiatric diagnosis.  However states she is currently taking fluoxetine, currently prescribed by her primary care provider.  Patient is unable to state her current dose, and/or how long she has been taking fluoxetine although she reports she continues to be compliant.  She states she believes she has been taking fluoxetine for approximately 2 years, however has not seen a difference.  She states she was  started on this medication by her primary care doctor" I told her I was feeling down.  She said less start this medication."  When assessing for depressive symptoms patient continues to remain vague however endorses sadness, heartbroken, anhedonia, loss of pleasure and motivation.  She does note" my family is my motivation.  Without my family and my not have any interest or pleasure in doing things."  She denies any anxiety symptoms at this time, however reports when she does experience anxiety she typically tends to retreat, becomes very quiet, and hypervigilant.  She denies any mania symptoms at all.  She does endorse some psychosis when assessing for hallucinations, paranoia, delusional thought disorder.  Patient states" this recently just started about 2-1/2 weeks ago.  They cut down some trees in my development, and I began hearing voices in the development with the trees work.  Sometimes when I am wheezing which I did not realize I was wheezing, I will hear voices coming from my wheeze that was saying help.  But I know I have a hearing deficit, has always had a hearing deficit.  Also there was a time when my sister was in the home, and also thought someone else was the year.  But she assured me it was just the 2 of [name]."  She denies any traumatic events, physical abuse, mental abuse, and or verbal abuse however endorses there were 3 significant events that she considers to be trauma."  The loss of my mother in 2009, loss of her husband in 2015, and loss of a close friend in 2016."  She denies receiving any grief counseling or services after either of these untimely deaths.  She states she just decided to take  1 step at a time and move forward when she can.  She currently denies any history of suicide attempt, suicidal ideation, suicidal thoughts, and or nonsuicidal self-injurious behavior.  And at the time of this psychiatric evaluation she further denies any suicidal ideation, homicidal ideation, and or  auditory or visual hallucinations.  She does appear to be interested in seeking outpatient psychiatric behavioral health services, as long as insurance will pay.  HPI:  [name] is a 70 year old female with past medical history significant for asthma, osteoarthritis, sarcoidosis, depression who presented to [facility] ED on 11/22 with difficulty ambulating, bilateral lower extremity pain and swelling.  Patient reports symptoms have been ongoing for roughly 1 month. Patient reports recently has been taking doxycycline and clindamycin for symptoms without relief.  Patient reports difficulty ambulating now causing her to "crawl on the floor" and she cannot walk because of the swelling "makes her feet numb".  She also reports right shoulder pain from a scooting on the floor.  Patient previously use furniture to get around the house and denied any history of using a cane/walker.   Patient follows with orthopedics outpatient, Dr. Marlou Sa for her severe osteoarthritis of her knees.   In the ED, temperature 98.6 F, HR 98, RR 22, BP 131/86, SPO2 98% on room air.  Sodium 139, potassium 4.2, chloride 104, CO2 26, glucose 129, BUN 15, creatinine 0.56, AST 59, ALT 30, total bilirubin 0.2.  BNP 13.0.  WBC 9.1, hemoglobin 9.9, platelets of 45.  COVID-19 PCR negative.  Fluenz A/B PCR negative.  Hospital consulted for further evaluation management of intractable knee/foot pain with cellulitis and outpatient failed treatment.  Past Psychiatric History: See above  Risk to Self: Denies Risk to Others: Denies Prior Inpatient Therapy: Denies 0 Prior Outpatient Therapy: Denies  Past Medical History:  Past Medical History:  Diagnosis Date   Acute bronchitis    Allergy    Anxiety    Arthritis    knees   Asthma    Backache, unspecified    Cataract    removed both eyes   Depression    Diverticulitis    Diverticulosis of colon (without mention of hemorrhage)    Edema    Glaucoma    Lumbago    Obesity    Other  chest pain    Pain in joint, ankle and foot    Sarcoidosis    Tibialis tendinitis    Unspecified asthma(493.90)     Past Surgical History:  Procedure Laterality Date   ABDOMINAL HYSTERECTOMY     CATARACT EXTRACTION W/PHACO Right 06/24/2014   Procedure: CATARACT EXTRACTION PHACO AND INTRAOCULAR LENS PLACEMENT ([place]) RIGHT EYE WITH GONIOSYNECHIALYSIS;  Surgeon: Marylynn Pearson, MD;  Location: [facility];  Service: Ophthalmology;  Laterality: Right;   COLONOSCOPY     EYE SURGERY Left    cataract surgery   HAND SURGERY     IR RADIOLOGIST EVAL & MGMT  11/14/2018   KNEE ARTHROSCOPY Right    Family History:  Family History  Problem Relation Age of Onset   Hypertension Mother    Stroke Mother    Diabetes Father    Sarcoidosis Sister    Colon cancer Neg Hx    Colon polyps Neg Hx    Esophageal cancer Neg Hx    Rectal cancer Neg Hx    Stomach cancer Neg Hx    Family Psychiatric  History: Denies Social History:  Social History   Substance and Sexual Activity  Alcohol Use Yes  Alcohol/week: 1.0 standard drink   Types: 1 Standard drinks or equivalent per week   Comment: social     Social History   Substance and Sexual Activity  Drug Use No    Social History   Socioeconomic History   Marital status: Widowed    Spouse name: Not on file   Number of children: Not on file   Years of education: Not on file   Highest education level: Professional school degree (e.g., MD, DDS, DVM, JD)  Occupational History   Not on file  Tobacco Use   Smoking status: Never   Smokeless tobacco: Never  Substance and Sexual Activity   Alcohol use: Yes    Alcohol/week: 1.0 standard drink    Types: 1 Standard drinks or equivalent per week    Comment: social   Drug use: No   Sexual activity: Not on file  Other Topics Concern   Not on file  Social History Narrative   Not on file   Social Determinants of Health   Financial Resource Strain: Low Risk    Difficulty of Paying Living Expenses: Not hard  at all  Food Insecurity: No Food Insecurity   Worried About Charity fundraiser in the Last Year: Never true   [place] in the Last Year: Never true  Transportation Needs: No Transportation Needs   Lack of Transportation (Medical): No   Lack of Transportation (Non-Medical): No  Physical Activity: Insufficiently Active   Days of Exercise per Week: 4 days   Minutes of Exercise per Session: 20 min  Stress: Stress Concern Present   Feeling of Stress : To some extent  Social Connections: Moderately Integrated   Frequency of Communication with Friends and Family: More than three times a week   Frequency of Social Gatherings with Friends and Family: Patient refused   Attends Religious Services: More than 4 times per year   Active Member of [employer] or Organizations: Yes   Attends Archivist Meetings: More than 4 times per year   Marital Status: Widowed   Additional Social History:    Allergies:   Allergies  Allergen Reactions   Ibuprofen     rash   Sulfa Antibiotics Rash    Labs: No results found for this or any previous visit (from the past 48 hour(s)).  Current Facility-Administered Medications  Medication Dose Route Frequency Provider Last Rate Last Admin   acetaminophen (TYLENOL) tablet 650 mg  650 mg Oral Q6H PRN Elodia Florence., MD   650 mg at 08/27/21 0547   Or   acetaminophen (TYLENOL) suppository 650 mg  650 mg Rectal Q6H PRN Elodia Florence., MD       alum & mag hydroxide-simeth (MAALOX/MYLANTA) 200-200-20 MG/5ML suspension 30 mL  30 mL Oral Q6H PRN [place] Eric J, DO   30 mL at 08/26/21 1407   ceFAZolin (ANCEF) IVPB 2g/100 mL premix  2 g Intravenous Q8H Elodia Florence., MD 200 mL/hr at 08/27/21 0618 2 g at 08/27/21 0618   cycloSPORINE (RESTASIS) 0.05 % ophthalmic emulsion 1 drop  1 drop Both Eyes BID Elodia Florence., MD   1 drop at 08/26/21 2136   diclofenac Sodium (VOLTAREN) 1 % topical gel 2 g  2 g Topical QID Elodia Florence., MD   2 g at 08/26/21 2137   enoxaparin (LOVENOX) injection 40 mg  40 mg Subcutaneous Q24H Elodia Florence., MD   40 mg at  08/26/21 2137   estradiol (ESTRACE) tablet 1 mg  1 mg Oral Daily Elodia Florence., MD   1 mg at 08/26/21 1008   FLUoxetine (PROZAC) capsule 10 mg  10 mg Oral Daily Elodia Florence., MD   10 mg at 08/26/21 1008   fluticasone (FLONASE) 50 MCG/ACT nasal spray 1 spray  1 spray Each Nare Daily Elodia Florence., MD   1 spray at 08/26/21 1027   ipratropium-albuterol (DUONEB) 0.5-2.5 (3) MG/3ML nebulizer solution 3 mL  3 mL Nebulization Q6H PRN [place] Donnamarie Poag, DO   3 mL at 08/24/21 2043   ipratropium-albuterol (DUONEB) 0.5-2.5 (3) MG/3ML nebulizer solution 3 mL  3 mL Nebulization BID [place] Eric J, DO   3 mL at 08/27/21 0843   loratadine (CLARITIN) tablet 5 mg  5 mg Oral QPM Elodia Florence., MD   5 mg at 08/26/21 1756   mometasone-formoterol (DULERA) 200-5 MCG/ACT inhaler 2 puff  2 puff Inhalation BID Elodia Florence., MD   2 puff at 08/27/21 0843   morphine 2 MG/ML injection 2 mg  2 mg Intravenous Q3H PRN [place] Eric J, DO   2 mg at 08/24/21 1220   oxyCODONE (Oxy IR/ROXICODONE) immediate release tablet 5 mg  5 mg Oral Q4H PRN Elodia Florence., MD   5 mg at 08/26/21 6256    Musculoskeletal: Strength & Muscle Tone: within normal limits Gait & Station: normal Patient leans: N/A   Psychiatric Specialty Exam:  Presentation  General Appearance: Appropriate for Environment; Casual  Eye Contact:Good  Speech:Clear and Coherent; Normal Rate  Speech Volume:Normal  Handedness:Right   Mood and Affect  Mood:Euthymic  Affect:Appropriate; Congruent   Thought Process  Thought Processes:Coherent; Linear; Goal Directed  Descriptions of Associations:Circumstantial  Orientation:Full (Time, Place and Person)  Thought Content:Logical  History of Schizophrenia/Schizoaffective disorder:No data recorded Duration of Psychotic  Symptoms:No data recorded Hallucinations:Hallucinations: Auditory Description of Auditory Hallucinations: When wheezing I hear voices "asking for help", Recently had trees cut down in my neighborhood, and I can hear voices coming from where the trees were.  Ideas of Reference:Paranoia (Thought someone else was in the house with me.)  Suicidal Thoughts:Suicidal Thoughts: No  Homicidal Thoughts:Homicidal Thoughts: No   Sensorium  Memory:Immediate Good; Recent Good; Remote Good  Judgment:Good  Insight:Good   Executive Functions  Concentration:Fair  Attention Span:Fair  [employer]   Psychomotor Activity  Psychomotor Activity:Psychomotor Activity: Normal   Assets  Assets:Financial Resources/Insurance; Desire for Improvement; Housing; Resilience; Physical Health; Social Support; Armed forces logistics/support/administrative officer; Vocational/Educational; Leisure Time; Talents/Skills; Transportation   Sleep  Sleep:Sleep: Good   Physical Exam: Physical Exam Vitals and nursing note reviewed.  Constitutional:      Appearance: Normal appearance. She is obese.  Skin:    General: Skin is warm and dry.  Neurological:     General: No focal deficit present.     Mental Status: She is alert and oriented to person, place, and time. Mental status is at baseline.  Psychiatric:        Mood and Affect: Mood normal.        Behavior: Behavior normal.        Thought Content: Thought content normal.        Judgment: Judgment normal.   Review of Systems  Psychiatric/Behavioral:  Positive for depression and hallucinations (auditory). The patient is nervous/anxious.   All other systems reviewed and are negative. Blood pressure 128/69, pulse (!) 103, temperature  98.5 F (36.9 C), temperature source Oral, resp. rate 18, height 5\' 2"  (1.575 m), weight 100.9 kg, SpO2 99 %. Body mass index is 40.69 kg/m.  Treatment Plan Summary: Plan Will increase Prozac 20mg  po daily to  target depressive symptoms. Will start quetapine 12.5mg  po qhs for auditroy hallucinations. EKG obtained on 11/22 QTc 426. Will refer to outpatient behavioral health for neuropsychological evaluation.  Patient states she prefers she receives all her consults while in the hospital, to help with appropriate follow up.     Atrophy and Chronic Microvascular changes -Patient with some neurologic changes on her CT scan. Recommend neurology consult for further evaluation. Suspect FTD, however inpatient consult will help with patient participation and treatment adherence in outpatient.   Suspected Cognitive Impairment -Will refer for nueropsych evaluation. Discussed with family regarding POA, Competency and Legal Guardianship.  -Patient Declined MoCA screening today however will revisit tomorrow.  She remains psychiatric cleared at this time, however will revisit tomorrow for MoCA screening.   Disposition: No evidence of imminent risk to self or others at present.   Patient does not meet criteria for psychiatric inpatient admission. Supportive therapy provided about ongoing stressors. Discussed crisis plan, support from social network, calling 911, coming to the Emergency Department, and calling Suicide Hotline.  Suella Broad, FNP 08/27/2021 11:27 AM

## 2021-08-27 NOTE — Consult Note (Signed)
Will recommend starting anti-depressant. mirtazapine 7.5mg  po qhs for depression, anxiety, and to increase his appetite. Patient with limited support system and instability. Patient does have some mental capacity to make decisions however he continues to have poor insight into to his mental condition. He minimizes his depressive symptoms. He does appear to be hopeful about getting assistance for nursing services.    Per chart review, patient was admitted on November 23 with bilateral cellulitis, acute metabolic encephalopathy.  Her hospital course has been complicated by cellulitis, lactic acidosis, possible osteomyelitis, and chronic pain in addition to ambulatory dysfunction.  There also seems to be concerns regarding skilled nursing facility placement, versus returning home with home health services.  Family has some concerns regarding patient's overall safety, questioning executive function, inability to remain independent at home.   On interview, [name] reports that she is receiving treatment for bilateral cellulitis" I was unable to walk, due to cellulitis of both feet.  My feet were constantly swollen and red, had difficulty getting up and down which became a problem."  She initially reports that she does understand why she developed the infection, and the current treatment recommendations for such.  She further reports she has agreed and consented to most recommendations and treatment.  She further reports she agrees with physical therapy, as well as Occupational Therapy current recommendation for skilled nursing facility placement.  She states at home" she has a walker, cane, and other mobility devices that help get around the home.  She further reports these assisted devices were left behind from her husband, and may not be appropriate for her mobility which is well at some point in for her to get evaluated while in the hospital.  She also tells this writer that she has strategically placed  furniture throughout her home, to assist her in mobility.  She is able to discuss risks versus benefits of improper mobility devices, as well as returning back home without skilled nursing facility.  Patient states "I could potentially fall and hit my head, become permanently impaired, hip fracture, and or died.  It is my understanding they are going to refer me to a skilled nursing facility, to gain more strength, conditioning myself and be able to return to independent living.  I am open to evaluation for proper mobility devices.  I think it is appropriate to have them personalize my use of equipment for my overall strength and conditioning."  Patient is also able to understand and propose consequences for avoiding skilled nursing facility to include" in my heart of hearts there are some facilities that are concerning to the level of care that they provide their patients.  This could potentially be a setback for any individual, if the appropriate services are not received.  After completion of skilled nursing facility, I would be open to a personal care aide, housekeeping, and or home physical therapy and occupational therapy to evaluate my home and the safety there of. "  This patient's decision to make medical decisions and or refuse skilled nursing facility is potentially impacted by ongoing depressive symptoms that have been identified as sadness and heartbroken, anhedonia.  Patient is also receiving pain medication which may alter her decision-making ability, in addition to recent CT scan showing new atrophy, and chronic microvascular changes.  She is alert and oriented x4, calm and cooperative, very pleasant and shows much willingness to engage.  Patient's thought process is very circumstantial, and she has a way with words.  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  At this present time the psychiatric consultation service believes the patient does have  decision-making capacity with respect to making medical decisions, and declining skilled nursing facility. Criteria for decision making capacity requires patient be able to: (1) communicate a choice in a clear and consistent manner, (2) demonstrate adequate understanding of disclosed relevant information regarding her/his medical condition and treatment, possible benefits and risks of that treatment, and alternative approaches, (3) describe views of her/his medical condition, proposed treatment and its consequences, and (4) engage in a rational process of manipulating the relevant information to reach her/his decision. Based on our examination, the patient does meet those criteria. It should be emphasized however, that capacity may need to be re-assessed, as the patient's mental status changes over time. Also, decisional capacity for other, specific treatment/disposition decisions will need to be evaluated on an individual basis.But be aware that capacity to make medical decision is a subjective evaluation that changes from day to day and a more objective evaluation is competency evaluation that can be ordered by a judge.    Conclusion: At this time, there is insufficient evidence to warrant removal of the patient's rights for medical decision-making. She can clearly determine mental capacity for decision-making.  At this time, we can determine that the patient does have functional mental capacity for medical decision-making including the right to accept or refuse SNF treatment.  .    A long discussion was had with patient and family regarding her medical complaints, safety at home, underlying psychiatric condition versus neurocognitive impairment.  Patient expresses an understanding of both her medical condition and the proposed plan.  Patient is able to express a choice to proceed with treatment and requests full work-up.  She does remain oppositional about APS report, as this will potentially impact her  ability to practice, and impact her livelihood.

## 2021-08-27 NOTE — Progress Notes (Signed)
PROGRESS NOTE    [name]  MVE:[id number] DOB: 08-25-1951 DOA: 08/23/2021 PCP: [name], MD    Brief Narrative:  [name] is a 70 year old female with past medical history significant for asthma, osteoarthritis, sarcoidosis, depression who presented to [place] ED on 11/22 with difficulty ambulating, bilateral lower extremity pain and swelling.  Patient reports symptoms have been ongoing for roughly 1 month. Patient reports recently has been taking doxycycline and clindamycin for symptoms without relief.  Patient reports difficulty ambulating now causing her to "crawl on the floor" and she cannot walk because of the swelling "makes her feet numb".  She also reports right shoulder pain from a scooting on the floor.  Patient previously use furniture to get around the house and denied any history of using a cane/walker.  Patient follows with orthopedics outpatient, Dr. Marlou Sa for her severe osteoarthritis of her knees.  In the ED, temperature 98.6 F, HR 98, RR 22, BP 131/86, SPO2 98% on room air.  Sodium 139, potassium 4.2, chloride 104, CO2 26, glucose 129, BUN 15, creatinine 0.56, AST 59, ALT 30, total bilirubin 0.2.  BNP 13.0.  WBC 9.1, hemoglobin 9.9, platelets of 45.  COVID-19 PCR negative.  Fluenz A/B PCR negative.  Hospital consulted for further evaluation management of intractable knee/foot pain with cellulitis and outpatient failed treatment.   Assessment & Plan:   Principal Problem:   Acute encephalopathy Active Problems:   SARCOIDOSIS   Asthma   Knee pain   Foot pain   Fall   Difficulty in walking   Bilateral lower extremity edema   Hormone replacement therapy   SIRS (systemic inflammatory response syndrome) (HCC)   Pressure injury of skin   Acute metabolic encephalopathy, POA: Resolved Etiology likely secondary to medication side effect with narcotics.  UDS negative.  EtOH level less than 10.  CT head without contrast with no acute intracranial pathology,  signs of atrophy and chronic microvascular ischemic changes.  Now resolved and alert and oriented.  Right foot cellulitis Lactic acidosis: Resolved MR right foot with marked subcutaneous soft tissue swelling/edema/fluid involving the entire foot most notably along the dorsum of the foot likely severe cellulitis without discrete drainable soft tissue abscess, marked marrow edema involving the distal calcaneus and adjacent cuboid could be degenerative versus septic arthritis versus osteomyelitis.  Patient reports failed outpatient treatment with doxycycline and clindamycin.  Patient was evaluated by orthopedics, Dr. Marlou Sa and surgical foot infection appears unlikely. --Blood cultures x2: no growth x 3 days --CRP 14.5>10.2 --ESR 85>99 --Lactic Acid 2.2>1.3 --Cefazolin 2 g IV every 8 hours; plan 7d course abx --Supportive care, pain control  Chronic pain bilateral knees/feet Patient reports progressive pain to bilateral knees and feet.  Follow-up with orthopedics outpatient, Dr. Marlou Sa.  X-ray left/right foot with severe end-stage tricompartmental arthritis and no acute fracture. Left foot x-ray with no acute osseous abnormality.  Right foot x-ray with no acute osseous abnormality. --Oxycodone 5 mg p.o. q4h PRN moderate pain --Outpatient follow-up with orthopedics  Bilateral lower extremity edema TTE with LVEF 70-75%, LV with hyperdynamic function, no LV regional wall motion normalities, normal LV diastolic parameters, IVC normal in size. Vascular duplex ultrasound bilateral lower extremities negative for DVT.  Ambulatory dysfunction --PT/OT recommending SNF, TOC for placement  Asthma --Dulera 200-5 mcg 2 puffs twice daily --duonebs prn   Depression:  --Fluoxetine increased to 20 mg p.o. daily  Ethics: Family concerned about patient's safety upon returning home as there is poor living conditions reported.  Patient was evaluated by psychiatry and has capacity for medical decision-making at  this time. --Social work following --Outpatient referral placed for neuropsych testing   DVT prophylaxis: enoxaparin (LOVENOX) injection 40 mg Start: 08/23/21 2200   Code Status: Full Code Family Communication: Updated sister and nephew present at bedside this morning  Disposition Plan:  Level of care: Telemetry Status is: Inpatient  Remains inpatient appropriate because: Continues on IV antibiotics with failed outpatient treatment for right foot cellulitis, PT/OT recommend SNF placement, TOC for evaluation   Consultants:  Orthopedics, Dr. Marlou Sa  Procedures:  TTE  Antimicrobials:  Cefazolin 11/23>>    Subjective: Patient seen examined bedside, resting calmly.  Sitting in bedside chair.  Nephew and sister present.  No specific complaints this morning.  Erythema to right foot much improved.  Pending SNF placement.  Family continues to be very concerned about her returning home given her current living situation; which were addressed by the social worker this morning.  Seen by psychiatry this morning and patient currently possesses capacity for medical decision-making.  No other questions or concerns at this time.  Denies headache, no dizziness, no chest pain, no shortness of breath, no abdominal pain, no weakness, no fatigue, no paresthesias.  No acute events overnight per nursing staff.  Objective: Vitals:   08/26/21 2035 08/27/21 0404 08/27/21 0508 08/27/21 0843  BP: 127/65 128/69    Pulse: 99 (!) 103    Resp:      Temp: 100 F (37.8 C) 98.5 F (36.9 C)    TempSrc: Oral Oral    SpO2: 99% 100%  99%  Weight:   100.9 kg   Height:        Intake/Output Summary (Last 24 hours) at 08/27/2021 1423 Last data filed at 08/27/2021 1320 Gross per 24 hour  Intake 800 ml  Output 1550 ml  Net -750 ml   Filed Weights   08/24/21 0500 08/26/21 0500 08/27/21 0508  Weight: 100.7 kg 100.7 kg 100.9 kg    Examination:  General exam: Appears calm and comfortable  Respiratory system:  Clear to auscultation. Respiratory effort normal.  On room air Cardiovascular system: S1 & S2 heard, RRR. No JVD, murmurs, rubs, gallops or clicks. No pedal edema. Gastrointestinal system: Abdomen is nondistended, soft and nontender. No organomegaly or masses felt. Normal bowel sounds heard. Central nervous system: Alert and oriented x 4. No focal neurological deficits. Extremities: Tenderness to palpation dorsal surface right foot, erythema now resolved, normal range of motion and preserved strength Skin: No rashes, lesions or ulcers Psychiatry: Judgement and insight appear normal. Mood & affect appropriate.     Data Reviewed: I have personally reviewed following labs and imaging studies  CBC: Recent Labs  Lab 08/23/21 0656 08/24/21 0230 08/25/21 0347  WBC 9.1 9.5 8.9  NEUTROABS 5.9  --   --   HGB 9.9* 9.4* 8.9*  HCT 32.1* 29.5* 28.7*  MCV 91.2 92.5 91.7  PLT 485* 426* 370*   Basic Metabolic Panel: Recent Labs  Lab 08/23/21 0656 08/24/21 0230 08/25/21 0347  NA 139 137 138  K 4.2 4.1 3.7  CL 104 104 104  CO2 _0 GLUCOSE 129* 109* 111*  BUN _1 CREATININE 0.56 0.61 0.59  CALCIUM 9.3 8.2* 8.2*   GFR: Estimated Creatinine Clearance: 70.6 mL/min (by C-G formula based on SCr of 0.59 mg/dL). Liver Function Tests: Recent Labs  Lab 08/23/21 0656 08/24/21 0230  AST 59* 55*  ALT 30 30  ALKPHOS 71  59  BILITOT 0.2* 0.2*  PROT 6.9 5.9*  ALBUMIN 3.3* 2.4*   No results for input(s): LIPASE, AMYLASE in the last 168 hours. Recent Labs  Lab 08/23/21 1057  AMMONIA 20   Coagulation Profile: No results for input(s): INR, PROTIME in the last 168 hours. Cardiac Enzymes: Recent Labs  Lab 08/23/21 1754  CKTOTAL 1,460*   BNP (last 3 results) Recent Labs    08/17/21 1322  PROBNP 3.0   HbA1C: No results for input(s): HGBA1C in the last 72 hours. CBG: No results for input(s): GLUCAP in the last 168 hours. Lipid Profile: No results for input(s): CHOL, HDL,  LDLCALC, TRIG, CHOLHDL, LDLDIRECT in the last 72 hours. Thyroid Function Tests: No results for input(s): TSH, T4TOTAL, FREET4, T3FREE, THYROIDAB in the last 72 hours. Anemia Panel: No results for input(s): VITAMINB12, FOLATE, FERRITIN, TIBC, IRON, RETICCTPCT in the last 72 hours. Sepsis Labs: Recent Labs  Lab 08/24/21 0015 08/24/21 0230  LATICACIDVEN 2.2* 1.3    Recent Results (from the past 240 hour(s))  Resp Panel by RT-PCR (Flu A&B, Covid) Nasopharyngeal Swab     Status: None   Collection Time: 08/23/21 11:02 AM   Specimen: Nasopharyngeal Swab; Nasopharyngeal(NP) swabs in vial transport medium  Result Value Ref Range Status   SARS Coronavirus 2 by RT PCR NEGATIVE NEGATIVE Final    Comment: (NOTE) SARS-CoV-2 target nucleic acids are NOT DETECTED.  The SARS-CoV-2 RNA is generally detectable in upper respiratory specimens during the acute phase of infection. The lowest concentration of SARS-CoV-2 viral copies this assay can detect is 138 copies/mL. A negative result does not preclude SARS-Cov-2 infection and should not be used as the sole basis for treatment or other patient management decisions. A negative result may occur with  improper specimen collection/handling, submission of specimen other than nasopharyngeal swab, presence of viral mutation(s) within the areas targeted by this assay, and inadequate number of viral copies(<138 copies/mL). A negative result must be combined with clinical observations, patient history, and epidemiological information. The expected result is Negative.  Fact Sheet for Patients:  EntrepreneurPulse.com.au  Fact Sheet for Healthcare Providers:  IncredibleEmployment.be  This test is no t yet approved or cleared by the [place] FDA and  has been authorized for detection and/or diagnosis of SARS-CoV-2 by FDA under an Emergency Use Authorization (EUA). This EUA will remain  in effect (meaning this test  can be used) for the duration of the COVID-19 declaration under Section 564(b)(1) of the Act, 21 U.S.C.section 360bbb-3(b)(1), unless the authorization is terminated  or revoked sooner.       Influenza A by PCR NEGATIVE NEGATIVE Final   Influenza B by PCR NEGATIVE NEGATIVE Final    Comment: (NOTE) The Xpert Xpress SARS-CoV-2/FLU/RSV plus assay is intended as an aid in the diagnosis of influenza from Nasopharyngeal swab specimens and should not be used as a sole basis for treatment. Nasal washings and aspirates are unacceptable for Xpert Xpress SARS-CoV-2/FLU/RSV testing.  Fact Sheet for Patients: EntrepreneurPulse.com.au  Fact Sheet for Healthcare Providers: IncredibleEmployment.be  This test is not yet approved or cleared by the [place] FDA and has been authorized for detection and/or diagnosis of SARS-CoV-2 by FDA under an Emergency Use Authorization (EUA). This EUA will remain in effect (meaning this test can be used) for the duration of the COVID-19 declaration under Section 564(b)(1) of the Act, 21 U.S.C. section 360bbb-3(b)(1), unless the authorization is terminated or revoked.  Performed at [facility], [address], [place], [place] [address]  Culture, blood (routine x 2)     Status: None (Preliminary result)   Collection Time: 08/24/21 12:15 AM   Specimen: Right Antecubital; Blood  Result Value Ref Range Status   Specimen Description   Final    RIGHT ANTECUBITAL Performed at [facility] [address] [place], [place] [address]    Special Requests   Final    BOTTLES DRAWN AEROBIC ONLY Blood Culture adequate volume Performed at [facility] [address], [place], [place] [address]    Culture   Final    NO GROWTH 3 DAYS Performed at [facility], [place] [address], [place], [place] [address]    Report Status PENDING  Incomplete  Culture, blood  (routine x 2)     Status: None (Preliminary result)   Collection Time: 08/24/21 12:15 AM   Specimen: Left Antecubital; Blood  Result Value Ref Range Status   Specimen Description   Final    LEFT ANTECUBITAL Performed at [facility] [address], [place], [place] [address]    Special Requests   Final    BOTTLES DRAWN AEROBIC ONLY Blood Culture adequate volume Performed at [facility] [address] [place], [place] [address]    Culture   Final    NO GROWTH 3 DAYS Performed at [facility], [place] [address] [place], [place] [address]    Report Status PENDING  Incomplete         Radiology Studies: No results found.      Scheduled Meds:  cycloSPORINE  1 drop Both Eyes BID   diclofenac Sodium  2 g Topical QID   enoxaparin (LOVENOX) injection  40 mg Subcutaneous Q24H   estradiol  1 mg Oral Daily   [START ON 08/28/2021] FLUoxetine  20 mg Oral Daily   fluticasone  1 spray Each Nare Daily   ipratropium-albuterol  3 mL Nebulization BID   loratadine  5 mg Oral QPM   mometasone-formoterol  2 puff Inhalation BID   QUEtiapine  12.5 mg Oral QHS   Continuous Infusions:   ceFAZolin (ANCEF) IV 2 g (08/27/21 1316)     LOS: 3 days    Time spent: 36 minutes spent on chart review, discussion with nursing staff, consultants, updating family and interview/physical exam; more than 50% of that time was spent in counseling and/or coordination of care.    Alysen Smylie J [place] DO [facility] Available via Epic secure chat 7am-7pm After these hours, please refer to coverage provider listed on [email or web address] 08/27/2021, 2:23 PM

## 2021-08-27 NOTE — Clinical Social Work Note (Signed)
30 Day [facility] Note  RE: [name] Date of Birth:1951/09/06 Date:08/27/2021  To Whom It May Concern:  Please be advised that the above-named patient will require a short-term nursing home stay - anticipated 30 days or less for rehabilitation and strengthening.  The plan is for return home.   Evette Cristal, MSW, Marlinda Mike [phone]

## 2021-08-27 NOTE — TOC Progression Note (Addendum)
Transition of Care [facility]) - Progression Note    Patient Details  Name: [name] MRN: [id number] Date of Birth: 1951/07/30  [facility] CM/SW Contact  Ross Ludwig, [place] Phone Number: 08/27/2021, 1:10 PM  Clinical Narrative:     CSW spoke to patient's nephew Princess Bruins, [phone].  Patient's nephew is concerned that patient lives in an unsafe environment due to patient hoarding.  Patient's nephew requested psych to see patient to assess any psych needs including mental capacity and any other psych diagnostics.  Psych saw patient and confirmed that she does have capacity to make her own decisions.  CSW explained to patient's nephew that as long as she has capacity to make her own decisions she can choose to live in the environment she is currently in.  CSW also informed patient's nephew that CSW works for the hospital, and can not force patient to do anything that she does not want to do.  CSW told patient's nephew that if he is concerned about the living environment, he can call [facility] [employer] and they can complete their own investigation.  CSW provided the phone number to him, which is [phone].  CSW was asked about status for skilled nursing facility (SNF) placement for rehab, CSW told him that her [facility] number is still pending which is a number from the state that everyone who goes to SNF needs to have before they can be admitted.  CSW will also need to get insurance authorization once patient has made a decision on a SNF.  At this time patient only has one bed offer which is [facility], patient may receive more offers on Monday.  CSW informed nephew that the social worker at the SNF can assist with figuring out what services are available for patient once she completes her rehab, and can also assist with ordering equipment, and setting up home health services.  CSW to continue to follow patient's progress throughout discharge planning.     5:14pm  CSW uploaded requested clinicals to [facility] Must for Passar number.  Expected Discharge Plan: [facility] Barriers to Discharge: Continued Medical Work up  Expected Discharge Plan and Services Expected Discharge Plan: [place]   Discharge Planning Services: CM Consult   Living arrangements for the past 2 months: Single Family Home                                       Social Determinants of Health (SDOH) Interventions    Readmission Risk Interventions No flowsheet data found.

## 2021-08-28 DIAGNOSIS — G9340 Encephalopathy, unspecified: Secondary | ICD-10-CM | POA: Diagnosis not present

## 2021-08-28 NOTE — Consult Note (Addendum)
  Montreal Cognitive Assessment  08/28/2021  Visuospatial/ Executive (0/5) 4  Naming (0/3) 3  Attention: Read list of digits (0/2) 2  Attention: Read list of letters (0/1) 1  Attention: Serial 7 subtraction starting at 100 (0/3) 3  Language: Repeat phrase (0/2) 2  Language : Fluency (0/1) 1  Abstraction (0/2) 2  Delayed Recall (0/5) 4  Orientation (0/6) 6  Total 28  Adjusted Score (based on education) 28   Patient scored a 28/30 on MoCA.   [name] is a 70 y.o. female who was admitted to the hospital with bilateral cellulitis, generalized weakness, and acute metabolic encephalopathy.  Psychiatric consult was placed for capacity evaluation, underlying psychiatric diagnosis, family concerned about current living conditions.  Please refer to yesterday's consult note, and capacity evaluation.  Patient is seen today and briefly reassessed, while complete in moca evaluation.  Today patient continues to be alert, awake, oriented, very pleasant, and anticipating my arrival.  Patient was assisted from bed to chair with the assistance of nurses aide at the bedside.  Patient was a moderate to person assist, and will continue to benefit from skilled nursing facility as she is very weak and unable to ambulate independently with assistance of mobility device.  Patient continues to deny any psychosis, delusions, self harming thoughts, suicidal ideations, and or homicidal ideations.   Patient scored 28/30 on Montreal cognitive assessment on today, a score higher than 26 indicates normal. Based on my evaluation, patient has capacity to make medication decision and appears to have no notable impaired cognitive function.  Moca is a screening tool that evaluates an individual's cognitive function, and aid and diagnosis in patient's with cognitive decline.  MoCA is a proven and useful cognitive screening tool only.  Discussed with patient that further testing will need to be done with neurology and/or  neuropsychology for prompt and proper evaluation of cognitive impairment, early detection, and or prevention of age-related cognitive impairment(i.e. dementia, Alzheimer's disease, followed total temporal dementia).    Recommendations: -Consider reaching out to patient's family to explore Competency assessment through a Marveen Reeks  -Also recommend outpatient follow-up and referral for neurology/neuropsychology.  Although limited [facility], Dr. Kyra Leyland psychologist, [facility], Dr. Ilean Skill, [facility], and Neuropsychology at [facility] all have appropriate services and clinicians available to conduct appropriate neuropsychological evaluations.   Disposition: No evidence of imminent risk to self or others at present.   Patient does not meet criteria for psychiatric inpatient admission. Supportive therapy provided about ongoing stressors. Psychiatric service signing out. Re-consult as needed

## 2021-08-28 NOTE — Progress Notes (Signed)
PROGRESS NOTE    [name]  ZOX:[id number] DOB: Feb 04, 1951 DOA: 08/23/2021 PCP: [name], MD    Brief Narrative:  [name] is a 70 year old female with past medical history significant for asthma, osteoarthritis, sarcoidosis, depression who presented to [facility] ED on 11/22 with difficulty ambulating, bilateral lower extremity pain and swelling.  Patient reports symptoms have been ongoing for roughly 1 month. Patient reports recently has been taking doxycycline and clindamycin for symptoms without relief.  Patient reports difficulty ambulating now causing her to "crawl on the floor" and she cannot walk because of the swelling "makes her feet numb".  She also reports right shoulder pain from a scooting on the floor.  Patient previously use furniture to get around the house and denied any history of using a cane/walker.  Patient follows with orthopedics outpatient, Dr. Marlou Sa for her severe osteoarthritis of her knees.  In the ED, temperature 98.6 F, HR 98, RR 22, BP 131/86, SPO2 98% on room air.  Sodium 139, potassium 4.2, chloride 104, CO2 26, glucose 129, BUN 15, creatinine 0.56, AST 59, ALT 30, total bilirubin 0.2.  BNP 13.0.  WBC 9.1, hemoglobin 9.9, platelets of 45.  COVID-19 PCR negative.  Fluenz A/B PCR negative.  Hospital consulted for further evaluation management of intractable knee/foot pain with cellulitis and outpatient failed treatment.   Assessment & Plan:   Principal Problem:   Acute encephalopathy Active Problems:   SARCOIDOSIS   Asthma   Knee pain   Foot pain   Fall   Difficulty in walking   Bilateral lower extremity edema   Hormone replacement therapy   SIRS (systemic inflammatory response syndrome) (HCC)   Pressure injury of skin   Acute metabolic encephalopathy, POA: Resolved Etiology likely secondary to medication side effect with narcotics.  UDS negative.  EtOH level less than 10.  CT head without contrast with no acute intracranial pathology,  signs of atrophy and chronic microvascular ischemic changes.  Now resolved and alert and oriented.  Right foot cellulitis Lactic acidosis: Resolved MR right foot with marked subcutaneous soft tissue swelling/edema/fluid involving the entire foot most notably along the dorsum of the foot likely severe cellulitis without discrete drainable soft tissue abscess, marked marrow edema involving the distal calcaneus and adjacent cuboid could be degenerative versus septic arthritis versus osteomyelitis.  Patient reports failed outpatient treatment with doxycycline and clindamycin.  Patient was evaluated by orthopedics, Dr. Marlou Sa and surgical foot infection appears unlikely. --Blood cultures x2: no growth x 4 days --CRP 14.5>10.2 --ESR 85>99 --Lactic Acid 2.2>1.3 --Cefazolin 2 g IV every 8 hours; plan 7d course abx --Supportive care, pain control  Chronic pain bilateral knees/feet Patient reports progressive pain to bilateral knees and feet.  Follow-up with orthopedics outpatient, Dr. Marlou Sa.  X-ray left/right foot with severe end-stage tricompartmental arthritis and no acute fracture. Left foot x-ray with no acute osseous abnormality.  Right foot x-ray with no acute osseous abnormality. --Oxycodone 5 mg p.o. q4h PRN moderate pain --Outpatient follow-up with orthopedics  Bilateral lower extremity edema TTE with LVEF 70-75%, LV with hyperdynamic function, no LV regional wall motion normalities, normal LV diastolic parameters, IVC normal in size. Vascular duplex ultrasound bilateral lower extremities negative for DVT.  Ambulatory dysfunction --PT/OT recommending SNF, TOC for placement  Asthma --Dulera 200-5 mcg 2 puffs twice daily --duonebs prn   Depression:  --Fluoxetine increased to 20 mg p.o. daily  Ethics: Family concerned about patient's safety upon returning home as there is poor living conditions reported.  Patient was evaluated by psychiatry and has capacity for medical decision-making at  this time. --Social work and psychiatry following --Outpatient referral placed for neuropsych testing --Psychiatry to perform MoCA today   DVT prophylaxis: enoxaparin (LOVENOX) injection 40 mg Start: 08/23/21 2200   Code Status: Full Code Family Communication: No family present at bedside this morning, extensively updated updated sister and nephew present at bedside yesterday Disposition Plan:  Level of care: Telemetry Status is: Inpatient  Remains inpatient appropriate because: Continues on IV antibiotics with failed outpatient treatment for right foot cellulitis, PT/OT recommend SNF placement, TOC for evaluation   Consultants:  Orthopedics, Dr. Marlou Sa  Procedures:  TTE  Antimicrobials:  Cefazolin 11/23>>    Subjective: Patient seen examined bedside, resting calmly.  Lying in bed.  No family present.  Was concerned about psychiatric evaluation yesterday, but states will perform MoCA test as planned by behavioral health today.  Awaiting bed offers for SNF placement.  No other specific complaints or concerns at this time.   Denies headache, no dizziness, no chest pain, no shortness of breath, no abdominal pain, no weakness, no fatigue, no paresthesias.  No acute events overnight per nursing staff.  Objective: Vitals:   08/27/21 2124 08/28/21 0619 08/28/21 0638 08/28/21 0850  BP: 108/61 127/67    Pulse: (!) 106 99    Resp: 20 20    Temp: 100 F (37.8 C) 99.1 F (37.3 C)    TempSrc:  Oral    SpO2: 96% 96%  96%  Weight:   99 kg   Height:        Intake/Output Summary (Last 24 hours) at 08/28/2021 1105 Last data filed at 08/28/2021 1019 Gross per 24 hour  Intake 640 ml  Output 1200 ml  Net -560 ml   Filed Weights   08/26/21 0500 08/27/21 0508 08/28/21 0638  Weight: 100.7 kg 100.9 kg 99 kg    Examination:  General exam: Appears calm and comfortable  Respiratory system: Clear to auscultation. Respiratory effort normal.  On room air Cardiovascular system: S1 & S2  heard, RRR. No JVD, murmurs, rubs, gallops or clicks. No pedal edema. Gastrointestinal system: Abdomen is nondistended, soft and nontender. No organomegaly or masses felt. Normal bowel sounds heard. Central nervous system: Alert and oriented x 4. No focal neurological deficits. Extremities: Tenderness to palpation dorsal surface right foot, erythema now resolved, normal range of motion and preserved strength Skin: No rashes, lesions or ulcers Psychiatry: Judgement and insight appear normal. Mood & affect appropriate.     Data Reviewed: I have personally reviewed following labs and imaging studies  CBC: Recent Labs  Lab 08/23/21 0656 08/24/21 0230 08/25/21 0347  WBC 9.1 9.5 8.9  NEUTROABS 5.9  --   --   HGB 9.9* 9.4* 8.9*  HCT 32.1* 29.5* 28.7*  MCV 91.2 92.5 91.7  PLT 485* 426* 104*   Basic Metabolic Panel: Recent Labs  Lab 08/23/21 0656 08/24/21 0230 08/25/21 0347  NA 139 137 138  K 4.2 4.1 3.7  CL 104 104 104  CO2 _0 GLUCOSE 129* 109* 111*  BUN _1 CREATININE 0.56 0.61 0.59  CALCIUM 9.3 8.2* 8.2*   GFR: Estimated Creatinine Clearance: 69.9 mL/min (by C-G formula based on SCr of 0.59 mg/dL). Liver Function Tests: Recent Labs  Lab 08/23/21 0656 08/24/21 0230  AST 59* 55*  ALT 30 30  ALKPHOS 71 59  BILITOT 0.2* 0.2*  PROT 6.9 5.9*  ALBUMIN 3.3* 2.4*   No  results for input(s): LIPASE, AMYLASE in the last 168 hours. Recent Labs  Lab 08/23/21 1057  AMMONIA 20   Coagulation Profile: No results for input(s): INR, PROTIME in the last 168 hours. Cardiac Enzymes: Recent Labs  Lab 08/23/21 1754  CKTOTAL 1,460*   BNP (last 3 results) Recent Labs    08/17/21 1322  PROBNP 3.0   HbA1C: No results for input(s): HGBA1C in the last 72 hours. CBG: No results for input(s): GLUCAP in the last 168 hours. Lipid Profile: No results for input(s): CHOL, HDL, LDLCALC, TRIG, CHOLHDL, LDLDIRECT in the last 72 hours. Thyroid Function Tests: No results  for input(s): TSH, T4TOTAL, FREET4, T3FREE, THYROIDAB in the last 72 hours. Anemia Panel: No results for input(s): VITAMINB12, FOLATE, FERRITIN, TIBC, IRON, RETICCTPCT in the last 72 hours. Sepsis Labs: Recent Labs  Lab 08/24/21 0015 08/24/21 0230  LATICACIDVEN 2.2* 1.3    Recent Results (from the past 240 hour(s))  Resp Panel by RT-PCR (Flu A&B, Covid) Nasopharyngeal Swab     Status: None   Collection Time: 08/23/21 11:02 AM   Specimen: Nasopharyngeal Swab; Nasopharyngeal(NP) swabs in vial transport medium  Result Value Ref Range Status   SARS Coronavirus 2 by RT PCR NEGATIVE NEGATIVE Final    Comment: (NOTE) SARS-CoV-2 target nucleic acids are NOT DETECTED.  The SARS-CoV-2 RNA is generally detectable in upper respiratory specimens during the acute phase of infection. The lowest concentration of SARS-CoV-2 viral copies this assay can detect is 138 copies/mL. A negative result does not preclude SARS-Cov-2 infection and should not be used as the sole basis for treatment or other patient management decisions. A negative result may occur with  improper specimen collection/handling, submission of specimen other than nasopharyngeal swab, presence of viral mutation(s) within the areas targeted by this assay, and inadequate number of viral copies(<138 copies/mL). A negative result must be combined with clinical observations, patient history, and epidemiological information. The expected result is Negative.  Fact Sheet for Patients:  BloggerCourse.com  Fact Sheet for Healthcare Providers:  SeriousBroker.it  This test is no t yet approved or cleared by the [place] FDA and  has been authorized for detection and/or diagnosis of SARS-CoV-2 by FDA under an Emergency Use Authorization (EUA). This EUA will remain  in effect (meaning this test can be used) for the duration of the COVID-19 declaration under Section 564(b)(1) of the Act,  21 U.S.C.section 360bbb-3(b)(1), unless the authorization is terminated  or revoked sooner.       Influenza A by PCR NEGATIVE NEGATIVE Final   Influenza B by PCR NEGATIVE NEGATIVE Final    Comment: (NOTE) The Xpert Xpress SARS-CoV-2/FLU/RSV plus assay is intended as an aid in the diagnosis of influenza from Nasopharyngeal swab specimens and should not be used as a sole basis for treatment. Nasal washings and aspirates are unacceptable for Xpert Xpress SARS-CoV-2/FLU/RSV testing.  Fact Sheet for Patients: BloggerCourse.com  Fact Sheet for Healthcare Providers: SeriousBroker.it  This test is not yet approved or cleared by the [place] FDA and has been authorized for detection and/or diagnosis of SARS-CoV-2 by FDA under an Emergency Use Authorization (EUA). This EUA will remain in effect (meaning this test can be used) for the duration of the COVID-19 declaration under Section 564(b)(1) of the Act, 21 U.S.C. section 360bbb-3(b)(1), unless the authorization is terminated or revoked.  Performed at [facility], [address] [place], [place] [address]   Culture, blood (routine x 2)     Status: None (Preliminary result)  Collection Time: 08/24/21 12:15 AM   Specimen: Right Antecubital; Blood  Result Value Ref Range Status   Specimen Description   Final    RIGHT ANTECUBITAL Performed at [facility] [address] [place], [place] [address]    Special Requests   Final    BOTTLES DRAWN AEROBIC ONLY Blood Culture adequate volume Performed at [facility] [address] [place], [place] [address]    Culture   Final    NO GROWTH 4 DAYS Performed at [facility], [place] [address], [place], [place] [address]    Report Status PENDING  Incomplete  Culture, blood (routine x 2)     Status: None (Preliminary result)   Collection Time: 08/24/21 12:15 AM    Specimen: Left Antecubital; Blood  Result Value Ref Range Status   Specimen Description   Final    LEFT ANTECUBITAL Performed at [facility] [address], [place], [place] [address]    Special Requests   Final    BOTTLES DRAWN AEROBIC ONLY Blood Culture adequate volume Performed at [facility] [address], [place], [place] [address]    Culture   Final    NO GROWTH 4 DAYS Performed at [facility], [place] [address] [place],  [address]    Report Status PENDING  Incomplete         Radiology Studies: No results found.      Scheduled Meds:  cycloSPORINE  1 drop Both Eyes BID   diclofenac Sodium  2 g Topical QID   enoxaparin (LOVENOX) injection  40 mg Subcutaneous Q24H   estradiol  1 mg Oral Daily   FLUoxetine  20 mg Oral Daily   fluticasone  1 spray Each Nare Daily   ipratropium-albuterol  3 mL Nebulization BID   loratadine  5 mg Oral QPM   mometasone-formoterol  2 puff Inhalation BID   QUEtiapine  12.5 mg Oral QHS   Continuous Infusions:   ceFAZolin (ANCEF) IV 2 g (08/28/21 0549)     LOS: 4 days    Time spent: 36 minutes spent on chart review, discussion with nursing staff, consultants, updating family and interview/physical exam; more than 50% of that time was spent in counseling and/or coordination of care.    Sherie Dobrowolski J [place] DO [facility] Available via Epic secure chat 7am-7pm After these hours, please refer to coverage provider listed on [email or web address] 08/28/2021, 11:05 AM

## 2021-08-28 NOTE — Progress Notes (Signed)
Telemetry discontinued per MD order.  Angie Fava, RN

## 2021-08-29 ENCOUNTER — Telehealth: Payer: Self-pay

## 2021-08-29 DIAGNOSIS — G9340 Encephalopathy, unspecified: Secondary | ICD-10-CM | POA: Diagnosis not present

## 2021-08-29 LAB — CULTURE, BLOOD (ROUTINE X 2)
Culture: NO GROWTH
Culture: NO GROWTH
Special Requests: ADEQUATE
Special Requests: ADEQUATE

## 2021-08-29 NOTE — Plan of Care (Signed)
  Problem: Education: Goal: Knowledge of General Education information will improve Description: Including pain rating scale, medication(s)/side effects and non-pharmacologic comfort measures Outcome: Progressing   Problem: Clinical Measurements: Goal: Diagnostic test results will improve Outcome: Progressing   Problem: Elimination: Goal: Will not experience complications related to bowel motility Outcome: Progressing   Problem: Safety: Goal: Ability to remain free from injury will improve Outcome: Progressing

## 2021-08-29 NOTE — TOC Progression Note (Signed)
Transition of Care [facility]) - Progression Note    Patient Details  Name: [name] MRN: [id number] Date of Birth: 01-20-1951  Transition of [facility] CM/SW Contact  Leeroy Cha, RN Phone Number: 08/29/2021, 2:40 PM  Clinical Narrative:    Dob updated in the passar system.   Expected Discharge Plan: [place] Barriers to Discharge: Continued Medical Work up  Expected Discharge Plan and Services Expected Discharge Plan: [place]   Discharge Planning Services: CM Consult   Living arrangements for the past 2 months: Single Family Home                                       Social Determinants of Health (SDOH) Interventions    Readmission Risk Interventions No flowsheet data found.

## 2021-08-29 NOTE — Progress Notes (Signed)
Physical Therapy Treatment Patient Details Name: [name] MRN: [id number] DOB: 1951/05/23 Today's Date: 08/29/2021   History of Present Illness [name] is a 70 y.o. female presenting with difficulty ambulating, bilateral LE pain and swelling, acute encephalopathy,SIRS. MRI: rigtht ankle--non specific acute findings. 11/23 pt c/o more of right knee pain due to positioning in MRI machine.PHMx: asthma, arthritis, sarcodisosis, depression, and other medical problems.    PT Comments    Pt requiring increased time to attempt different methods of performing sit to stands.  Pt assisted to recliner and then stood once more from recliner before reporting fatigue.  Pt reports increased right shoulder pain today and does present with limited strength and AROM in R shoulder so notified RN.  Continue to recommend SNF upon d/c.    Recommendations for follow up therapy are one component of a multi-disciplinary discharge planning process, led by the attending physician.  Recommendations may be updated based on patient status, additional functional criteria and insurance authorization.  Follow Up Recommendations  Skilled nursing-short term rehab (<3 hours/day)     Assistance Recommended at Discharge Intermittent Supervision/Assistance  Equipment Recommendations  Rolling walker (2 wheels);BSC/3in1;Wheelchair (measurements PT)    Recommendations for Other Services       Precautions / Restrictions Precautions Precautions: Fall     Mobility  Bed Mobility Overal bed mobility: Needs Assistance Bed Mobility: Supine to Sit     Supine to sit: Supervision;HOB elevated          Transfers Overall transfer level: Needs assistance Equipment used: Rolling walker (2 wheels) Transfers: Sit to/from Stand;Bed to chair/wheelchair/BSC Sit to Stand: Min assist     Step pivot transfers: Min assist     General transfer comment: verbal cues for positioning, technique however pt prefers to  keep elbows on RW due to hx of carpel tunnel and current right shoulder pain, performed one more sit to stand and encouraged use of LE muscles to assist (pt initially with poor control upon sitting in recliner)    Ambulation/Gait                   Stairs             Wheelchair Mobility    Modified Rankin (Stroke Patients Only)       Balance Overall balance assessment: Needs assistance Sitting-balance support: Feet supported;No upper extremity supported Sitting balance-Leahy Scale: Good     Standing balance support: Bilateral upper extremity supported;Reliant on assistive device for balance Standing balance-Leahy Scale: Poor                              Cognition Arousal/Alertness: Awake/alert Behavior During Therapy: WFL for tasks assessed/performed Overall Cognitive Status: Within Functional Limits for tasks assessed                                 General Comments: polite and cooperative        Exercises      General Comments        Pertinent Vitals/Pain Pain Assessment: Faces Faces Pain Scale: Hurts even more Pain Location: right knee, right shoulder Pain Descriptors / Indicators: Aching;Sore Pain Intervention(s): Repositioned;Monitored during session (RN notified)    Home Living                          Prior Function  PT Goals (current goals can now be found in the care plan section) Progress towards PT goals: Progressing toward goals    Frequency    Min 2X/week      PT Plan Current plan remains appropriate    Co-evaluation              AM-PAC PT "6 Clicks" Mobility   Outcome Measure  Help needed turning from your back to your side while in a flat bed without using bedrails?: A Little Help needed moving from lying on your back to sitting on the side of a flat bed without using bedrails?: A Little Help needed moving to and from a bed to a chair (including a wheelchair)?: A  Lot Help needed standing up from a chair using your arms (e.g., wheelchair or bedside chair)?: A Lot Help needed to walk in hospital room?: A Lot Help needed climbing 3-5 steps with a railing? : Total 6 Click Score: 13    End of Session Equipment Utilized During Treatment: Gait belt Activity Tolerance: Patient tolerated treatment well Patient left: in chair;with call bell/phone within reach Nurse Communication: Mobility status PT Visit Diagnosis: Difficulty in walking, not elsewhere classified (R26.2);Muscle weakness (generalized) (M62.81);Unsteadiness on feet (R26.81)     Time: 1024-1050 PT Time Calculation (min) (ACUTE ONLY): 26 min  Charges:  $Therapeutic Activity: 23-37 mins                    Jannette Spanner PT, DPT Acute Rehabilitation Services Pager: [phone] Office: [place] 08/29/2021, 1:29 PM

## 2021-08-29 NOTE — Care Management Important Message (Signed)
Important Message  Patient Details IM Letter given to the Patient. Name: [name] MRN: [id number] Date of Birth: Nov 09, 1950   Medicare Important Message Given:  Yes     Kerin Salen 08/29/2021, 12:00 PM

## 2021-08-29 NOTE — Telephone Encounter (Signed)
VOB submitted for [facility]ov[facility], bilateral knee. BV Pending. Appt. Needs to be after 09/15/2021

## 2021-08-29 NOTE — Progress Notes (Signed)
PROGRESS NOTE    [name]  URK:[id number] DOB: 1951-08-23 DOA: 08/23/2021 PCP: [name], MD    Brief Narrative:  [name] is a 70 year old female with past medical history significant for asthma, osteoarthritis, sarcoidosis, depression who presented to [facility] ED on 11/22 with difficulty ambulating, bilateral lower extremity pain and swelling.  Patient reports symptoms have been ongoing for roughly 1 month. Patient reports recently has been taking doxycycline and clindamycin for symptoms without relief.  Patient reports difficulty ambulating now causing her to "crawl on the floor" and she cannot walk because of the swelling "makes her feet numb".  She also reports right shoulder pain from a scooting on the floor.  Patient previously use furniture to get around the house and denied any history of using a cane/walker.  Patient follows with orthopedics outpatient, Dr. Marlou Sa for her severe osteoarthritis of her knees.  In the ED, temperature 98.6 F, HR 98, RR 22, BP 131/86, SPO2 98% on room air.  Sodium 139, potassium 4.2, chloride 104, CO2 26, glucose 129, BUN 15, creatinine 0.56, AST 59, ALT 30, total bilirubin 0.2.  BNP 13.0.  WBC 9.1, hemoglobin 9.9, platelets of 45.  COVID-19 PCR negative.  Fluenz A/B PCR negative.  Hospital consulted for further evaluation management of intractable knee/foot pain with cellulitis and outpatient failed treatment.   Assessment & Plan:   Principal Problem:   Acute encephalopathy Active Problems:   SARCOIDOSIS   Asthma   Knee pain   Foot pain   Fall   Difficulty in walking   Bilateral lower extremity edema   Hormone replacement therapy   SIRS (systemic inflammatory response syndrome) (HCC)   Pressure injury of skin   Acute metabolic encephalopathy, POA: Resolved Etiology likely secondary to medication side effect with narcotics.  UDS negative.  EtOH level less than 10.  CT head without contrast with no acute intracranial pathology,  signs of atrophy and chronic microvascular ischemic changes.  Now resolved and alert and oriented.  Right foot cellulitis Lactic acidosis: Resolved MR right foot with marked subcutaneous soft tissue swelling/edema/fluid involving the entire foot most notably along the dorsum of the foot likely severe cellulitis without discrete drainable soft tissue abscess, marked marrow edema involving the distal calcaneus and adjacent cuboid could be degenerative versus septic arthritis versus osteomyelitis.  Patient reports failed outpatient treatment with doxycycline and clindamycin.  Patient was evaluated by orthopedics, Dr. Marlou Sa and surgical foot infection appears unlikely. --Blood cultures x2: no growth x 5 days --CRP 14.5>10.2 --ESR 85>99 --Lactic Acid 2.2>1.3 --Cefazolin 2 g IV every 8 hours; plan 7d course abx (End: 11/29) --Supportive care, pain control  Chronic pain bilateral knees/feet Patient reports progressive pain to bilateral knees and feet.  Follow-up with orthopedics outpatient, Dr. Marlou Sa.  X-ray left/right foot with severe end-stage tricompartmental arthritis and no acute fracture. Left foot x-ray with no acute osseous abnormality.  Right foot x-ray with no acute osseous abnormality. --Oxycodone 5 mg p.o. q4h PRN moderate pain --Outpatient follow-up with orthopedics  Bilateral lower extremity edema TTE with LVEF 70-75%, LV with hyperdynamic function, no LV regional wall motion normalities, normal LV diastolic parameters, IVC normal in size. Vascular duplex ultrasound bilateral lower extremities negative for DVT.  Ambulatory dysfunction --PT/OT recommending SNF, TOC for placement  Asthma --Dulera 200-5 mcg 2 puffs twice daily --duonebs prn   Depression:  --Fluoxetine increased to 20 mg p.o. daily --Seroquel 12.5 mg p.o. nightly  Ethics: Family concerned about patient's safety upon returning home  as there is poor living conditions reported.  Patient was evaluated by psychiatry and  has capacity for medical decision-making at this time.  MoCA performed on 08/28/2021 28/30. --Social work and psychiatry following --Outpatient referral placed for neuropsych testing   DVT prophylaxis: enoxaparin (LOVENOX) injection 40 mg Start: 08/23/21 2200   Code Status: Full Code Family Communication: Updated patient's sister and nephew present at bedside this morning. Disposition Plan:  Level of care: Telemetry Status is: Inpatient  Remains inpatient appropriate because: Awaiting PASSR number and SNF placement.   Consultants:  Orthopedics, Dr. Marlou Sa  Procedures:  TTE  Antimicrobials:  Cefazolin 11/23>>    Subjective: Patient seen examined bedside, resting calmly.  Lying in bed.  Family present.   No other specific complaints or concerns at this time.   Denies headache, no dizziness, no nausea/vomiting/diarrhea, no fever/chills/night sweats, no chest pain, no shortness of breath, no abdominal pain, no weakness, no fatigue.  No acute events overnight per nursing staff. Awaiting PASSR number and SNF placement.  Objective: Vitals:   08/28/21 2026 08/28/21 2053 08/29/21 0555 08/29/21 0825  BP:  125/61    Pulse:  (!) 107    Resp:  18    Temp:  99.2 F (37.3 C)    TempSrc:  Oral    SpO2: 92% 90%  92%  Weight:   98.1 kg   Height:        Intake/Output Summary (Last 24 hours) at 08/29/2021 1143 Last data filed at 08/29/2021 0925 Gross per 24 hour  Intake 899.84 ml  Output 1250 ml  Net -350.16 ml   Filed Weights   08/27/21 0508 08/28/21 0638 08/29/21 0555  Weight: 100.9 kg 99 kg 98.1 kg    Examination:  General exam: Appears calm and comfortable  Respiratory system: Clear to auscultation. Respiratory effort normal.  On room air Cardiovascular system: S1 & S2 heard, RRR. No JVD, murmurs, rubs, gallops or clicks. No pedal edema. Gastrointestinal system: Abdomen is nondistended, soft and nontender. No organomegaly or masses felt. Normal bowel sounds heard. Central  nervous system: Alert and oriented x 4. No focal neurological deficits. Extremities: Tenderness to palpation dorsal surface right foot, erythema now resolved, normal range of motion and preserved strength Skin: No rashes, lesions or ulcers Psychiatry: Judgement and insight appear normal. Mood & affect appropriate.     Data Reviewed: I have personally reviewed following labs and imaging studies  CBC: Recent Labs  Lab 08/23/21 0656 08/24/21 0230 08/25/21 0347  WBC 9.1 9.5 8.9  NEUTROABS 5.9  --   --   HGB 9.9* 9.4* 8.9*  HCT 32.1* 29.5* 28.7*  MCV 91.2 92.5 91.7  PLT 485* 426* 195*   Basic Metabolic Panel: Recent Labs  Lab 08/23/21 0656 08/24/21 0230 08/25/21 0347  NA 139 137 138  K 4.2 4.1 3.7  CL 104 104 104  CO2 '26 27 27  ' GLUCOSE 129* 109* 111*  BUN '15 10 8  ' CREATININE 0.56 0.61 0.59  CALCIUM 9.3 8.2* 8.2*   GFR: Estimated Creatinine Clearance: 69.5 mL/min (by C-G formula based on SCr of 0.59 mg/dL). Liver Function Tests: Recent Labs  Lab 08/23/21 0656 08/24/21 0230  AST 59* 55*  ALT 30 30  ALKPHOS 71 59  BILITOT 0.2* 0.2*  PROT 6.9 5.9*  ALBUMIN 3.3* 2.4*   No results for input(s): LIPASE, AMYLASE in the last 168 hours. Recent Labs  Lab 08/23/21 1057  AMMONIA 20   Coagulation Profile: No results for input(s): INR, PROTIME in the  last 168 hours. Cardiac Enzymes: Recent Labs  Lab 08/23/21 1754  CKTOTAL 1,460*   BNP (last 3 results) Recent Labs    08/17/21 1322  PROBNP 3.0   HbA1C: No results for input(s): HGBA1C in the last 72 hours. CBG: No results for input(s): GLUCAP in the last 168 hours. Lipid Profile: No results for input(s): CHOL, HDL, LDLCALC, TRIG, CHOLHDL, LDLDIRECT in the last 72 hours. Thyroid Function Tests: No results for input(s): TSH, T4TOTAL, FREET4, T3FREE, THYROIDAB in the last 72 hours. Anemia Panel: No results for input(s): VITAMINB12, FOLATE, FERRITIN, TIBC, IRON, RETICCTPCT in the last 72 hours. Sepsis  Labs: Recent Labs  Lab 08/24/21 0015 08/24/21 0230  LATICACIDVEN 2.2* 1.3    Recent Results (from the past 240 hour(s))  Resp Panel by RT-PCR (Flu A&B, Covid) Nasopharyngeal Swab     Status: None   Collection Time: 08/23/21 11:02 AM   Specimen: Nasopharyngeal Swab; Nasopharyngeal(NP) swabs in vial transport medium  Result Value Ref Range Status   SARS Coronavirus 2 by RT PCR NEGATIVE NEGATIVE Final    Comment: (NOTE) SARS-CoV-2 target nucleic acids are NOT DETECTED.  The SARS-CoV-2 RNA is generally detectable in upper respiratory specimens during the acute phase of infection. The lowest concentration of SARS-CoV-2 viral copies this assay can detect is 138 copies/mL. A negative result does not preclude SARS-Cov-2 infection and should not be used as the sole basis for treatment or other patient management decisions. A negative result may occur with  improper specimen collection/handling, submission of specimen other than nasopharyngeal swab, presence of viral mutation(s) within the areas targeted by this assay, and inadequate number of viral copies(<138 copies/mL). A negative result must be combined with clinical observations, patient history, and epidemiological information. The expected result is Negative.  Fact Sheet for Patients:  EntrepreneurPulse.com.au  Fact Sheet for Healthcare Providers:  IncredibleEmployment.be  This test is no t yet approved or cleared by the [place] FDA and  has been authorized for detection and/or diagnosis of SARS-CoV-2 by FDA under an Emergency Use Authorization (EUA). This EUA will remain  in effect (meaning this test can be used) for the duration of the COVID-19 declaration under Section 564(b)(1) of the Act, 21 U.S.C.section 360bbb-3(b)(1), unless the authorization is terminated  or revoked sooner.       Influenza A by PCR NEGATIVE NEGATIVE Final   Influenza B by PCR NEGATIVE NEGATIVE Final     Comment: (NOTE) The Xpert Xpress SARS-CoV-2/FLU/RSV plus assay is intended as an aid in the diagnosis of influenza from Nasopharyngeal swab specimens and should not be used as a sole basis for treatment. Nasal washings and aspirates are unacceptable for Xpert Xpress SARS-CoV-2/FLU/RSV testing.  Fact Sheet for Patients: EntrepreneurPulse.com.au  Fact Sheet for Healthcare Providers: IncredibleEmployment.be  This test is not yet approved or cleared by the [place] FDA and has been authorized for detection and/or diagnosis of SARS-CoV-2 by FDA under an Emergency Use Authorization (EUA). This EUA will remain in effect (meaning this test can be used) for the duration of the COVID-19 declaration under Section 564(b)(1) of the Act, 21 U.S.C. section 360bbb-3(b)(1), unless the authorization is terminated or revoked.  Performed at [facility], [address] [place], [place] [address]   Culture, blood (routine x 2)     Status: None   Collection Time: 08/24/21 12:15 AM   Specimen: Right Antecubital; Blood  Result Value Ref Range Status   Specimen Description   Final    RIGHT ANTECUBITAL Performed at [facility]  [place] [address], [place], [place] [address]    Special Requests   Final    BOTTLES DRAWN AEROBIC ONLY Blood Culture adequate volume Performed at [facility] [address], [place], [place] [address]    Culture   Final    NO GROWTH 5 DAYS Performed at [facility], [place] [address], [place], [place] [address]    Report Status 08/29/2021 FINAL  Final  Culture, blood (routine x 2)     Status: None   Collection Time: 08/24/21 12:15 AM   Specimen: Left Antecubital; Blood  Result Value Ref Range Status   Specimen Description   Final    LEFT ANTECUBITAL Performed at [facility] [address], [place], [place] [address]    Special Requests   Final     BOTTLES DRAWN AEROBIC ONLY Blood Culture adequate volume Performed at [facility] [address], [place], [place] [address]    Culture   Final    NO GROWTH 5 DAYS Performed at [facility], [place] [address] [place], [place] [address]    Report Status 08/29/2021 FINAL  Final         Radiology Studies: No results found.      Scheduled Meds:  cycloSPORINE  1 drop Both Eyes BID   diclofenac Sodium  2 g Topical QID   enoxaparin (LOVENOX) injection  40 mg Subcutaneous Q24H   estradiol  1 mg Oral Daily   FLUoxetine  20 mg Oral Daily   fluticasone  1 spray Each Nare Daily   loratadine  5 mg Oral QPM   mometasone-formoterol  2 puff Inhalation BID   QUEtiapine  12.5 mg Oral QHS   Continuous Infusions:   ceFAZolin (ANCEF) IV 2 g (08/29/21 0530)     LOS: 5 days    Time spent: 35 minutes spent on chart review, discussion with nursing staff, consultants, updating family and interview/physical exam; more than 50% of that time was spent in counseling and/or coordination of care.    Kevante Lunt J [place] DO [facility] Available via Epic secure chat 7am-7pm After these hours, please refer to coverage provider listed on [email or web address] 08/29/2021, 11:43 AM

## 2021-08-30 DIAGNOSIS — G9340 Encephalopathy, unspecified: Secondary | ICD-10-CM | POA: Diagnosis not present

## 2021-08-30 LAB — RESP PANEL BY RT-PCR (FLU A&B, COVID) ARPGX2
Influenza A by PCR: NEGATIVE
Influenza B by PCR: NEGATIVE
SARS Coronavirus 2 by RT PCR: NEGATIVE

## 2021-08-30 LAB — CREATININE, SERUM
Creatinine, Ser: 0.57 mg/dL (ref 0.44–1.00)
GFR, Estimated: >60 mL/min (ref >60)

## 2021-08-30 MED ORDER — FLUOXETINE HCL 20 MG PO CAPS: 20.0000 mg | ORAL_CAPSULE | Freq: Every day | ORAL | 3 refills | Status: DC

## 2021-08-30 MED ORDER — OXYCODONE HCL 5 MG PO TABS: 5.0000 mg | ORAL_TABLET | ORAL | 0 refills | Status: DC | PRN

## 2021-08-30 MED ORDER — QUETIAPINE FUMARATE 25 MG PO TABS: 12.5000 mg | ORAL_TABLET | Freq: Every day | ORAL | Status: DC

## 2021-08-30 NOTE — Plan of Care (Signed)
  Problem: Education: Goal: Knowledge of General Education information will improve Description: Including pain rating scale, medication(s)/side effects and non-pharmacologic comfort measures Outcome: Progressing   Problem: Clinical Measurements: Goal: Will remain free from infection Outcome: Progressing Goal: Diagnostic test results will improve Outcome: Progressing   Problem: Activity: Goal: Risk for activity intolerance will decrease Outcome: Progressing   

## 2021-08-30 NOTE — Discharge Summary (Signed)
Physician Discharge Summary  [name] ZOX:[id number] DOB: Jul 21, 1951 DOA: 08/23/2021  PCP: [name], MD  Admit date: 08/23/2021 Discharge date: 08/30/2021  Admitted From: Home Disposition: SNF  Recommendations for Outpatient Follow-up:  Follow up with PCP in 1-2 weeks Follow-up with orthopedics, Dr. Marlou Sa in 2-3 weeks Ambulatory referral placed for neuropsych testing  Discharge Condition: Stable CODE STATUS: Full code Diet recommendation:   History of present illness:  [name] is a 70 year old female with past medical history significant for asthma, osteoarthritis, sarcoidosis, depression who presented to [facility] ED on 11/22 with difficulty ambulating, bilateral lower extremity pain and swelling.  Patient reports symptoms have been ongoing for roughly 1 month. Patient reports recently has been taking doxycycline and clindamycin for symptoms without relief.  Patient reports difficulty ambulating now causing her to "crawl on the floor" and she cannot walk because of the swelling "makes her feet numb".  She also reports right shoulder pain from a scooting on the floor.  Patient previously use furniture to get around the house and denied any history of using a cane/walker.   Patient follows with orthopedics outpatient, Dr. Marlou Sa for her severe osteoarthritis of her knees.   In the ED, temperature 98.6 F, HR 98, RR 22, BP 131/86, SPO2 98% on room air.  Sodium 139, potassium 4.2, chloride 104, CO2 26, glucose 129, BUN 15, creatinine 0.56, AST 59, ALT 30, total bilirubin 0.2.  BNP 13.0.  WBC 9.1, hemoglobin 9.9, platelets of 45.  COVID-19 PCR negative.  Fluenz A/B PCR negative.  Hospital consulted for further evaluation management of intractable knee/foot pain with cellulitis and outpatient failed treatment.  Hospital course:  Acute metabolic encephalopathy, POA: Resolved Etiology likely secondary to medication side effect with narcotics.  UDS negative.  EtOH level less  than 10.  CT head without contrast with no acute intracranial pathology, signs of atrophy and chronic microvascular ischemic changes.  Now resolved and alert and oriented.   Right foot cellulitis Lactic acidosis: Resolved MR right foot with marked subcutaneous soft tissue swelling/edema/fluid involving the entire foot most notably along the dorsum of the foot likely severe cellulitis without discrete drainable soft tissue abscess, marked marrow edema involving the distal calcaneus and adjacent cuboid could be degenerative versus septic arthritis versus osteomyelitis.  Patient reports failed outpatient treatment with doxycycline and clindamycin.  Patient was evaluated by orthopedics, Dr. Marlou Sa and surgical foot infection appears unlikely.  Blood cultures remain negative x5 days.  Patient completed 7-day course of IV cefazolin with improvement of cellulitis during hospitalization.  Outpatient follow-up with orthopedics.   Chronic pain bilateral knees/feet Patient reports progressive pain to bilateral knees and feet.  Follow-up with orthopedics outpatient, Dr. Marlou Sa.  X-ray left/right foot with severe end-stage tricompartmental arthritis and no acute fracture. Left foot x-ray with no acute osseous abnormality.  Right foot x-ray with no acute osseous abnormality. Oxycodone 5 mg p.o. q4h PRN moderate pain. Outpatient follow-up with orthopedics   Bilateral lower extremity edema TTE with LVEF 70-75%, LV with hyperdynamic function, no LV regional wall motion normalities, normal LV diastolic parameters, IVC normal in size. Vascular duplex ultrasound bilateral lower extremities negative for DVT.  Continue furosemide as needed.   Ambulatory dysfunction Discharging to SNF.   Asthma Continue home inhalers   Depression:  Fluoxetine increased to 20 mg p.o. daily, started on Seroquel 12.5 mg p.o. nightly by psychiatry.  Morbid obesity Body mass index is 39.92 kg/m.  Discussed with patient needs for aggressive  lifestyle changes/weight  loss as this complicates all facets of care.  Outpatient follow-up with PCP.  May benefit from bariatric evaluation outpatient.  Stage II buttock ulcer, POA Continue protective dressing placement, local wound care, frequent offloading.   Ethics: Family concerned about patient's safety upon returning home as there is poor living conditions reported. Patient was evaluated by psychiatry and has capacity for medical decision-making at this time.  MoCA performed on 08/28/2021 28/30. Outpatient referral placed for neuropsych testing  Discharge Diagnoses:  Active Problems:   SARCOIDOSIS   Asthma   Knee pain   Foot pain   Fall   Difficulty in walking   Bilateral lower extremity edema   Hormone replacement therapy   Pressure injury of skin    Discharge Instructions  Discharge Instructions     Ambulatory referral to Neuropsychology   Complete by: As directed    Diet - low sodium heart healthy   Complete by: As directed    Discharge wound care:   Complete by: As directed    Continue protective dressing changes and frequent offloading to stage II buttock ulcer   Increase activity slowly   Complete by: As directed       Allergies as of 08/30/2021       Reactions   Ibuprofen    rash   Sulfa Antibiotics Rash        Medication List     STOP taking these medications    oxyCODONE-acetaminophen 5-325 MG tablet Commonly known as: PERCOCET/ROXICET   potassium chloride SA 20 MEQ tablet Commonly known as: KLOR-CON M   predniSONE 10 MG tablet Commonly known as: DELTASONE       TAKE these medications    albuterol 108 (90 Base) MCG/ACT inhaler Commonly known as: VENTOLIN HFA TAKE 2 PUFFS BY MOUTH EVERY 6 HOURS AS NEEDED What changed:  how much to take how to take this when to take this reasons to take this additional instructions   cycloSPORINE 0.05 % ophthalmic emulsion Commonly known as: RESTASIS Restasis 0.05 % eye drops in a  dropperette   estradiol 1 MG tablet Commonly known as: ESTRACE Take 1 tablet (1 mg total) by mouth daily.   FLUoxetine 20 MG capsule Commonly known as: PROZAC Take 1 capsule (20 mg total) by mouth daily. Start taking on: August 31, 2021 What changed:  medication strength how much to take   fluticasone 50 MCG/ACT nasal spray Commonly known as: FLONASE Place 1 spray into both nostrils daily.   furosemide 40 MG tablet Commonly known as: LASIX TAKE 0.5 TABLETS (20 MG TOTAL) BY MOUTH AS NEEDED (SWELLING).   GINKOBA PO Take 1 capsule by mouth daily.   levocetirizine 5 MG tablet Commonly known as: Xyzal Allergy 24HR Take 1 tablet (5 mg total) by mouth every evening.   meloxicam 15 MG tablet Commonly known as: MOBIC Take 1 tablet (15 mg total) by mouth daily.   multivitamin with minerals tablet Take 1 tablet by mouth daily.   oxyCODONE 5 MG immediate release tablet Commonly known as: Oxy IR/ROXICODONE Take 1 tablet (5 mg total) by mouth every 4 (four) hours as needed for moderate pain.   polyethylene glycol powder 17 GM/SCOOP powder Commonly known as: GLYCOLAX/MIRALAX Take 17 g by mouth daily as needed.   QUEtiapine 25 MG tablet Commonly known as: SEROQUEL Take 0.5 tablets (12.5 mg total) by mouth at bedtime.   Vitamin D (Ergocalciferol) 1.25 MG (50000 UNIT) Caps capsule Commonly known as: DRISDOL Take 1 capsule (50,000 Units total) by  mouth every 7 (seven) days.   Wixela Inhub 250-50 MCG/ACT Aepb Generic drug: fluticasone-salmeterol INHALE 1 PUFF BY MOUTH TWICE A DAY What changed: See the new instructions.               Discharge Care Instructions  (From admission, onward)           Start     Ordered   08/30/21 0000  Discharge wound care:       Comments: Continue protective dressing changes and frequent offloading to stage II buttock ulcer   08/30/21 1207            Follow-up Information     [name], MD. Schedule an appointment  as soon as possible for a visit in 1 week(s).   Specialty: Family Medicine Contact information: [place] [address] [address]. Schedule an appointment as soon as possible for a visit in 2 week(s).   Specialty: Psychology Contact information: [facility] [place]. [place]        [name], MD. Schedule an appointment as soon as possible for a visit in 1 week(s).   Specialty: Family Medicine Contact information: [place] [address] [phone]         Meredith Pel, MD. Schedule an appointment as soon as possible for a visit in 2 week(s).   Specialty: Orthopedic Surgery Contact information: [address] [place] [address] [phone]                Allergies  Allergen Reactions   Ibuprofen     rash   Sulfa Antibiotics Rash    Consultations: Psychiatry Orthopedics, Dr. Marlou Sa   Procedures/Studies: DG Chest 2 View  Result Date: 08/03/2021 CLINICAL DATA:  Wheezing. EXAM: CHEST - 2 VIEW COMPARISON:  None. FINDINGS: The heart size and mediastinal contours are within normal limits. Both lungs are clear. The visualized skeletal structures are unremarkable. IMPRESSION: No active cardiopulmonary disease. Electronically Signed   By: Ronney Asters M.D.   On: 08/03/2021 15:50   DG Knee 1-2 Views Left  Result Date: 08/24/2021 CLINICAL DATA:  Pain. EXAM: LEFT KNEE - 1-2 VIEW COMPARISON:  None. FINDINGS: There is diffusely decreased mineralization of the bones. No acute fracture or dislocation is seen. Moderate to severe tricompartmental degenerative changes are noted and most pronounced in the patellofemoral compartment. No definite joint effusion or significant soft tissue abnormality. IMPRESSION: 1. No acute fracture or dislocation. 2. Moderate to severe  degenerative changes. Electronically Signed   By: Brett Fairy M.D.   On: 08/24/2021 00:45   DG Knee 1-2 Views Right  Result Date: 08/24/2021 CLINICAL DATA:  Pain. EXAM: RIGHT KNEE - 1-2 VIEW COMPARISON:  None. FINDINGS: There is diffusely decreased mineralization the bones. No acute fracture or dislocation is seen. Severe tricompartmental degenerative changes are noted, most pronounced in the patellofemoral compartment. There is a small suprapatellar joint effusion. IMPRESSION: 1. No acute fracture or dislocation. 2. Severe osteoarthritis. 3. Small suprapatellar joint effusion. Electronically Signed   By: Brett Fairy M.D.   On: 08/24/2021 00:48   CT Head Wo Contrast  Result Date: 08/23/2021 CLINICAL DATA:  Mental status changes in a 70 year old female. EXAM: CT HEAD WITHOUT CONTRAST TECHNIQUE: Contiguous axial images were obtained from the base of the skull through the vertex without intravenous contrast. COMPARISON:  MRI of the brain of January 08, 2021. FINDINGS: Brain: No evidence of acute infarction, hemorrhage, hydrocephalus, extra-axial collection or mass lesion/mass effect. Signs of atrophy and chronic microvascular ischemic change similar accounting for comparison across modalities when compared to April of 2022. Vascular: No hyperdense vessel or unexpected calcification. Skull: Normal. Negative for fracture or focal lesion. Sinuses/Orbits: Visualized paranasal sinuses and orbits are unremarkable. Other: None. IMPRESSION: No acute intracranial pathology. Signs of atrophy and chronic microvascular ischemic change similar accounting for comparison across modalities when compared to April of 2022. Electronically Signed   By: Zetta Bills M.D.   On: 08/23/2021 08:55   MR FOOT RIGHT WO CONTRAST  Result Date: 08/24/2021 CLINICAL DATA:  Foot pain and swelling. EXAM: MRI OF THE RIGHT FOREFOOT WITHOUT CONTRAST TECHNIQUE: Multiplanar, multisequence MR imaging of the right foot was performed. No  intravenous contrast was administered. COMPARISON:  Radiographs 07/30/2021 FINDINGS: Severe subcutaneous soft tissue swelling/edema/fluid involving the entire foot, most notably along the dorsum of the foot. No discrete fluid collection to suggest a drainable soft tissue abscess. This is likely severe cellulitis. Fairly marked marrow edema involving the distal calcaneus and adjacent cuboid. There are degenerative changes at the joint and a small joint effusion. This could be degenerative in nature but could not exclude the possibility of septic arthritis and osteomyelitis. Recommend clinical correlation with any pain or tenderness in this area. A repeat MRI of the ankle/hindfoot without and with contrast may be helpful for further evaluation of this finding. The other bony structures are intact. No other findings suspicious for septic arthritis or osteomyelitis. There are moderate degenerative changes at the first MTP joint. The major tendons and ligaments appear grossly intact. IMPRESSION: 1. Marked subcutaneous soft tissue swelling/edema/fluid involving the entire foot, most notably along the dorsum of the foot. This is likely severe cellulitis. No discrete drainable soft tissue abscess. 2. Fairly marked marrow edema involving the distal calcaneus and adjacent cuboid. This could be degenerative in nature but could not exclude the possibility of septic arthritis and osteomyelitis. A repeat MRI of the ankle/hindfoot without and with contrast may be helpful for further evaluation of this finding. Electronically Signed   By: Marijo Sanes M.D.   On: 08/24/2021 07:42   MR ANKLE RIGHT W WO CONTRAST  Result Date: 08/24/2021 CLINICAL DATA:  Ankle pain and swelling. Evaluate for osteoarthritis in right ankle. Osteomyelitis, ankle. EXAM: MRI OF THE RIGHT ANKLE WITHOUT AND WITH CONTRAST TECHNIQUE: Multiplanar, multisequence MR imaging of the ankle was performed before and after the administration of intravenous  contrast. CONTRAST:  49mL GADAVIST GADOBUTROL 1 MMOL/ML IV SOLN COMPARISON:  Foot radiographs 08/24/2021 and 07/30/2021. Forefoot MRI 08/23/2021. FINDINGS: TENDONS Peroneal: Intact and normally positioned. Posteromedial: Intact and normally positioned. Anterior: Intact and normally positioned. There is a small amount of enhancement surrounding the tibialis anterior tendon in the midfoot which may reflect tenosynovitis. Achilles: Intact. Plantar Fascia: Intact. LIGAMENTS Lateral: The anterior and posterior talofibular and calcaneofibular ligaments are intact.The inferior tibiofibular ligaments appear intact. Medial: The deltoid and visualized portions of the spring ligament appear intact. CARTILAGE AND BONES Ankle Joint: Small ankle joint effusion without suspicious synovial enhancement. The talar dome and tibial plafond appear normal. Subtalar Joints/Sinus Tarsi: The subtalar joint appears normal. There is a small amount of fluid within the tarsal sinus without suspicious enhancement. Bones: As seen on recent forefoot MRI, there is bone marrow edema within the distal calcaneus and adjacent cuboid adjacent to the calcaneocuboid articulation. There is associated marrow enhancement  in these areas. There is a small joint effusion with mild surrounding soft tissue enhancement. There is associated subchondral cyst formation peripherally, but no gross bone destruction. No other osseous abnormalities are identified. Other: Generalized subcutaneous edema surrounding the ankle, extending into dorsum of the foot, similar to recent MRI. No focal fluid collections or other abnormal soft tissue enhancement identified. No evident skin ulceration over the calcaneocuboid articulation. IMPRESSION: IMPRESSION 1. Nonspecific calcaneocuboid arthropathy, unchanged from forefoot MRI done last night. Again, these findings could be degenerative, although a septic joint is not completely excluded. No soft tissue abscess or overlying skin  ulceration identified. 2. No other significant arthropathic changes. 3. The ankle tendons and ligaments appear intact. Possible mild anterior tibialis tenosynovitis. 4. Nonspecific generalized subcutaneous edema which could reflect cellulitis. No evidence of soft tissue abscess. Electronically Signed   By: Richardean Sale M.D.   On: 08/24/2021 09:51   DG Chest Portable 1 View  Result Date: 08/23/2021 CLINICAL DATA:  70 year old female with history of bilateral leg swelling and feet pain. EXAM: PORTABLE CHEST 1 VIEW COMPARISON:  Chest x-ray 08/03/2021. FINDINGS: Lung volumes are low. No consolidative airspace disease. No pleural effusions. No pneumothorax. No pulmonary nodule or mass noted. Pulmonary vasculature and the cardiomediastinal silhouette are within normal limits. IMPRESSION: 1. Low lung volumes without radiographic evidence of acute cardiopulmonary disease. Electronically Signed   By: Vinnie Langton M.D.   On: 08/23/2021 07:29   DG Foot 2 Views Left  Result Date: 08/24/2021 CLINICAL DATA:  Pain. EXAM: LEFT FOOT - 2 VIEW COMPARISON:  None. FINDINGS: There is diffusely decreased mineralization of the bones. No acute fracture or dislocation is seen. Degenerative changes are noted in the midfoot. There is moderate calcaneal spurring. Soft tissue swelling is present over the dorsum of the foot and at the ankle. IMPRESSION: No acute osseous abnormality. Electronically Signed   By: Brett Fairy M.D.   On: 08/24/2021 00:44   DG Foot 2 Views Right  Result Date: 08/24/2021 CLINICAL DATA:  Pain. EXAM: RIGHT FOOT - 2 VIEW COMPARISON:  None. FINDINGS: There is diffusely decreased mineralization of the bones. No acute fracture or dislocation is seen. There is mild hallux valgus deformity with degenerative changes at the first metatarsophalangeal joint. Degenerative changes are noted in the midfoot. There is moderate calcaneal spurring. Soft tissue swelling is noted over the dorsum of the foot and at  the ankle. IMPRESSION: No acute osseous abnormality. Electronically Signed   By: Brett Fairy M.D.   On: 08/24/2021 00:43   ECHOCARDIOGRAM COMPLETE  Result Date: 08/24/2021    ECHOCARDIOGRAM REPORT   Patient Name:   KASHA HOWETH Date of Exam: 08/24/2021 Medical Rec #:  [id number]        Height:       62.0 in Accession #:    [id number]       Weight:       222.0 lb Date of Birth:  [birth year]        BSA:          1.998 m Patient Age:    70 years         BP:           136/56 mmHg Patient Gender: F                HR:           100 bpm. Exam Location:  Inpatient Procedure: 2D Echo, Cardiac Doppler and Color Doppler Indications:    Edema  History:        Patient has prior history of Echocardiogram examinations, most                 recent 10/10/2016.  Sonographer:    Bernadene Person RDCS Referring Phys: [phone] A CALDWELL [place]  1. Left ventricular ejection fraction, by estimation, is 70 to 75%. The left ventricle has hyperdynamic function. The left ventricle has no regional wall motion abnormalities. There is mild left ventricular hypertrophy. Left ventricular diastolic parameters were normal.  2. Right ventricular systolic function is normal. The right ventricular size is normal. Tricuspid regurgitation signal is inadequate for assessing PA pressure.  3. The mitral valve is normal in structure. No evidence of mitral valve regurgitation.  4. The aortic valve was not well visualized. Aortic valve regurgitation is not visualized. No aortic stenosis is present.  5. The inferior vena cava is normal in size with <50% respiratory variability, suggesting right atrial pressure of 8 mmHg. FINDINGS  Left Ventricle: Left ventricular ejection fraction, by estimation, is 70 to 75%. The left ventricle has hyperdynamic function. The left ventricle has no regional wall motion abnormalities. The left ventricular internal cavity size was normal in size. There is mild left ventricular hypertrophy. Left ventricular  diastolic parameters were normal. Right Ventricle: The right ventricular size is normal. No increase in right ventricular wall thickness. Right ventricular systolic function is normal. Tricuspid regurgitation signal is inadequate for assessing PA pressure. Left Atrium: Left atrial size was normal in size. Right Atrium: Right atrial size was normal in size. Pericardium: There is no evidence of pericardial effusion. Mitral Valve: The mitral valve is normal in structure. No evidence of mitral valve regurgitation. Tricuspid Valve: The tricuspid valve is normal in structure. Tricuspid valve regurgitation is trivial. Aortic Valve: The aortic valve was not well visualized. Aortic valve regurgitation is not visualized. No aortic stenosis is present. Pulmonic Valve: The pulmonic valve was not well visualized. Pulmonic valve regurgitation is not visualized. Aorta: The aortic root and ascending aorta are structurally normal, with no evidence of dilitation. Venous: The inferior vena cava is normal in size with less than 50% respiratory variability, suggesting right atrial pressure of 8 mmHg. IAS/Shunts: The interatrial septum was not well visualized.  LEFT VENTRICLE PLAX 2D LVIDd:         4.50 cm   Diastology LVIDs:         2.80 cm   LV e' medial:    9.25 cm/s LV PW:         1.20 cm   LV E/e' medial:  11.7 LV IVS:        1.00 cm   LV e' lateral:   10.60 cm/s LVOT diam:     1.90 cm   LV E/e' lateral: 10.2 LV SV:         60 LV SV Index:   30 LVOT Area:     2.84 cm  RIGHT VENTRICLE RV S prime:     15.90 cm/s TAPSE (M-mode): 1.8 cm LEFT ATRIUM             Index        RIGHT ATRIUM           Index LA diam:        3.30 cm 1.65 cm/m   RA Area:     11.70 cm LA Vol (A2C):   39.6 ml 19.82 ml/m  RA Volume:   28.30 ml  14.16 ml/m LA Vol (A4C):  31.0 ml 15.51 ml/m LA Biplane Vol: 37.0 ml 18.51 ml/m  AORTIC VALVE LVOT Vmax:   135.00 cm/s LVOT Vmean:  89.600 cm/s LVOT VTI:    0.210 m  AORTA Ao Root diam: 3.00 cm Ao Asc diam:  3.00 cm  MITRAL VALVE MV Area (PHT): 5.02 cm     SHUNTS MV Decel Time: 151 msec     Systemic VTI:  0.21 m MV E velocity: 108.00 cm/s  Systemic Diam: 1.90 cm MV A velocity: 149.00 cm/s MV E/A ratio:  0.72 Oswaldo Milian MD Electronically signed by Oswaldo Milian MD Signature Date/Time: 08/24/2021/2:14:29 PM    Final    VAS [name] LOWER EXTREMITY VENOUS (DVT)  Result Date: 08/25/2021  Lower Venous DVT Study Patient Name:  DARNETTE LAMPRON  Date of Exam:   08/24/2021 Medical Rec #: [id number]         Accession #:    [id number] Date of Birth: 02-03-1951         Patient Gender: F Patient Age:   53 years Exam Location[facility]  [facility] Procedure:      VAS [name] LOWER EXTREMITY VENOUS (DVT) Referring Phys: A POWELL JR --------------------------------------------------------------------------------  Indications: Edema.  Limitations: Poor ultrasound/tissue interface and body habitus. Comparison Study: No prior study Performing Technologist: Maudry Mayhew MHA, RDMS, RVT, RDCS  Examination Guidelines: A complete evaluation includes B-mode imaging, spectral Doppler, color Doppler, and power Doppler as needed of all accessible portions of each vessel. Bilateral testing is considered an integral part of a complete examination. Limited examinations for reoccurring indications may be performed as noted. The reflux portion of the exam is performed with the patient in reverse Trendelenburg.  +---------+---------------+---------+-----------+----------+--------------+ RIGHT    CompressibilityPhasicitySpontaneityPropertiesThrombus Aging +---------+---------------+---------+-----------+----------+--------------+ CFV      Full           Yes      Yes                                 +---------+---------------+---------+-----------+----------+--------------+ SFJ      Full                                                        +---------+---------------+---------+-----------+----------+--------------+ FV Prox   Full                                                        +---------+---------------+---------+-----------+----------+--------------+ FV Mid   Full                                                        +---------+---------------+---------+-----------+----------+--------------+ FV DistalFull                                                        +---------+---------------+---------+-----------+----------+--------------+ PFV  Full                                                        +---------+---------------+---------+-----------+----------+--------------+ POP      Full           Yes      Yes                                 +---------+---------------+---------+-----------+----------+--------------+ PTV      Full                                                        +---------+---------------+---------+-----------+----------+--------------+ PERO     Full                                                        +---------+---------------+---------+-----------+----------+--------------+   +---------+---------------+---------+-----------+----------+--------------+ LEFT     CompressibilityPhasicitySpontaneityPropertiesThrombus Aging +---------+---------------+---------+-----------+----------+--------------+ CFV      Full           Yes      Yes                                 +---------+---------------+---------+-----------+----------+--------------+ SFJ      Full                                                        +---------+---------------+---------+-----------+----------+--------------+ FV Prox  Full                                                        +---------+---------------+---------+-----------+----------+--------------+ FV Mid   Full                                                        +---------+---------------+---------+-----------+----------+--------------+ FV DistalFull                                                         +---------+---------------+---------+-----------+----------+--------------+ PFV      Full                                                        +---------+---------------+---------+-----------+----------+--------------+  POP      Full           Yes      Yes                                 +---------+---------------+---------+-----------+----------+--------------+ PTV      Full                                                        +---------+---------------+---------+-----------+----------+--------------+ PERO     Full                                                        +---------+---------------+---------+-----------+----------+--------------+     Summary: BILATERAL: - No evidence of deep vein thrombosis seen in the lower extremities, bilaterally. -No evidence of popliteal cyst, bilaterally.   *See table(s) above for measurements and observations. Electronically signed by Harold Barban MD on 08/25/2021 at 12:50:34 AM.    Final      Subjective: Patient seen examined at bedside, resting comfortably.  No specific complaints this morning other than continued "diffuse pains".  Discharging to SNF today.  Psychiatry recommended outpatient neuropsych testing; although currently patient possesses capacity for medical decision-making.  No other questions or concerns at this time.  Denies headache, no chest pain, no palpitations, no shortness of breath, no dizziness, no fever/chills/night sweats, no nausea cefonicid diarrhea, no weakness, no fatigue.  No acute events overnight per nursing staff.  Discharge Exam: Vitals:   08/30/21 0456 08/30/21 0833  BP: (!) 151/76   Pulse: (!) 105   Resp: 20   Temp: 98.2 F (36.8 C)   SpO2: 97% 95%   Vitals:   08/30/21 0019 08/30/21 0456 08/30/21 0500 08/30/21 0833  BP: 116/63 (!) 151/76    Pulse: (!) 110 (!) 105    Resp: 20 20    Temp: 98.1 F (36.7 C) 98.2 F (36.8 C)    TempSrc: Oral Oral    SpO2: 96% 97%  95%   Weight:   99 kg   Height:        General: Pt is alert, awake, not in acute distress, obese Cardiovascular: RRR, S1/S2 +, no rubs, no gallops Respiratory: CTA bilaterally, no wheezing, no rhonchi Abdominal: Soft, NT, ND, bowel sounds + Extremities: no edema, no cyanosis, mild nonpitting peripheral edema lower extremities    The results of significant diagnostics from this hospitalization (including imaging, microbiology, ancillary and laboratory) are listed below for reference.     Microbiology: Recent Results (from the past 240 hour(s))  Resp Panel by RT-PCR (Flu A&B, Covid) Nasopharyngeal Swab     Status: None   Collection Time: 08/23/21 11:02 AM   Specimen: Nasopharyngeal Swab; Nasopharyngeal(NP) swabs in vial transport medium  Result Value Ref Range Status   SARS Coronavirus 2 by RT PCR NEGATIVE NEGATIVE Final    Comment: (NOTE) SARS-CoV-2 target nucleic acids are NOT DETECTED.  The SARS-CoV-2 RNA is generally detectable in upper respiratory specimens during the acute phase of infection. The lowest concentration of SARS-CoV-2 viral copies this assay can detect is 138 copies/mL. A negative result  does not preclude SARS-Cov-2 infection and should not be used as the sole basis for treatment or other patient management decisions. A negative result may occur with  improper specimen collection/handling, submission of specimen other than nasopharyngeal swab, presence of viral mutation(s) within the areas targeted by this assay, and inadequate number of viral copies(<138 copies/mL). A negative result must be combined with clinical observations, patient history, and epidemiological information. The expected result is Negative.  Fact Sheet for Patients:  EntrepreneurPulse.com.au  Fact Sheet for Healthcare Providers:  IncredibleEmployment.be  This test is no t yet approved or cleared by the [place] FDA and  has been authorized for  detection and/or diagnosis of SARS-CoV-2 by FDA under an Emergency Use Authorization (EUA). This EUA will remain  in effect (meaning this test can be used) for the duration of the COVID-19 declaration under Section 564(b)(1) of the Act, 21 U.S.C.section 360bbb-3(b)(1), unless the authorization is terminated  or revoked sooner.       Influenza A by PCR NEGATIVE NEGATIVE Final   Influenza B by PCR NEGATIVE NEGATIVE Final    Comment: (NOTE) The Xpert Xpress SARS-CoV-2/FLU/RSV plus assay is intended as an aid in the diagnosis of influenza from Nasopharyngeal swab specimens and should not be used as a sole basis for treatment. Nasal washings and aspirates are unacceptable for Xpert Xpress SARS-CoV-2/FLU/RSV testing.  Fact Sheet for Patients: EntrepreneurPulse.com.au  Fact Sheet for Healthcare Providers: IncredibleEmployment.be  This test is not yet approved or cleared by the [place] FDA and has been authorized for detection and/or diagnosis of SARS-CoV-2 by FDA under an Emergency Use Authorization (EUA). This EUA will remain in effect (meaning this test can be used) for the duration of the COVID-19 declaration under Section 564(b)(1) of the Act, 21 U.S.C. section 360bbb-3(b)(1), unless the authorization is terminated or revoked.  Performed at [facility], [address], [place], [place] [address]   Culture, blood (routine x 2)     Status: None   Collection Time: 08/24/21 12:15 AM   Specimen: Right Antecubital; Blood  Result Value Ref Range Status   Specimen Description   Final    RIGHT ANTECUBITAL Performed at [facility] [address], [place], [place] [address]    Special Requests   Final    BOTTLES DRAWN AEROBIC ONLY Blood Culture adequate volume Performed at [facility] [address] [place], [place] [address]    Culture   Final    NO GROWTH 5 DAYS Performed at  [facility], [place] [address], [place], [place] [address]    Report Status 08/29/2021 FINAL  Final  Culture, blood (routine x 2)     Status: None   Collection Time: 08/24/21 12:15 AM   Specimen: Left Antecubital; Blood  Result Value Ref Range Status   Specimen Description   Final    LEFT ANTECUBITAL Performed at [facility] [address] [place], [place] [address]    Special Requests   Final    BOTTLES DRAWN AEROBIC ONLY Blood Culture adequate volume Performed at [facility] [address], [place], [place] [address]    Culture   Final    NO GROWTH 5 DAYS Performed at [facility], [place] [address], [place],  [address]    Report Status 08/29/2021 FINAL  Final     Labs: BNP (last 3 results) Recent Labs    08/23/21 0656  BNP 31.4   Basic Metabolic Panel:  Recent Labs  Lab 08/24/21 0230 08/25/21 0347 08/30/21 0350  NA 137 138  --   K 4.1 3.7  --   CL 104 104  --   CO2 27 27  --   GLUCOSE 109* 111*  --   BUN 10 8  --   CREATININE 0.61 0.59 0.57  CALCIUM 8.2* 8.2*  --    Liver Function Tests: Recent Labs  Lab 08/24/21 0230  AST 55*  ALT 30  ALKPHOS 59  BILITOT 0.2*  PROT 5.9*  ALBUMIN 2.4*   No results for input(s): LIPASE, AMYLASE in the last 168 hours. No results for input(s): AMMONIA in the last 168 hours. CBC: Recent Labs  Lab 08/24/21 0230 08/25/21 0347  WBC 9.5 8.9  HGB 9.4* 8.9*  HCT 29.5* 28.7*  MCV 92.5 91.7  PLT 426* 467*   Cardiac Enzymes: Recent Labs  Lab 08/23/21 1754  CKTOTAL 1,460*   BNP: Invalid input(s): POCBNP CBG: No results for input(s): GLUCAP in the last 168 hours. D-Dimer No results for input(s): DDIMER in the last 72 hours. Hgb A1c No results for input(s): HGBA1C in the last 72 hours. Lipid Profile No results for input(s): CHOL, HDL, LDLCALC, TRIG, CHOLHDL, LDLDIRECT in the last 72 hours. Thyroid function studies No results for input(s): TSH, T4TOTAL,  T3FREE, THYROIDAB in the last 72 hours.  Invalid input(s): FREET3 Anemia work up No results for input(s): VITAMINB12, FOLATE, FERRITIN, TIBC, IRON, RETICCTPCT in the last 72 hours. Urinalysis    Component Value Date/Time   COLORURINE YELLOW 08/23/2021 1102   APPEARANCEUR CLEAR 08/23/2021 1102   LABSPEC 1.023 08/23/2021 1102   PHURINE 5.5 08/23/2021 1102   GLUCOSEU NEGATIVE 08/23/2021 1102   HGBUR NEGATIVE 08/23/2021 1102   BILIRUBINUR NEGATIVE 08/23/2021 1102   BILIRUBINUR n 09/05/2016 1520   KETONESUR NEGATIVE 08/23/2021 1102   PROTEINUR TRACE (A) 08/23/2021 1102   UROBILINOGEN 1.0 09/05/2016 1520   NITRITE NEGATIVE 08/23/2021 1102   LEUKOCYTESUR TRACE (A) 08/23/2021 1102   Sepsis Labs Invalid input(s): PROCALCITONIN,  WBC,  LACTICIDVEN Microbiology Recent Results (from the past 240 hour(s))  Resp Panel by RT-PCR (Flu A&B, Covid) Nasopharyngeal Swab     Status: None   Collection Time: 08/23/21 11:02 AM   Specimen: Nasopharyngeal Swab; Nasopharyngeal(NP) swabs in vial transport medium  Result Value Ref Range Status   SARS Coronavirus 2 by RT PCR NEGATIVE NEGATIVE Final    Comment: (NOTE) SARS-CoV-2 target nucleic acids are NOT DETECTED.  The SARS-CoV-2 RNA is generally detectable in upper respiratory specimens during the acute phase of infection. The lowest concentration of SARS-CoV-2 viral copies this assay can detect is 138 copies/mL. A negative result does not preclude SARS-Cov-2 infection and should not be used as the sole basis for treatment or other patient management decisions. A negative result may occur with  improper specimen collection/handling, submission of specimen other than nasopharyngeal swab, presence of viral mutation(s) within the areas targeted by this assay, and inadequate number of viral copies(<138 copies/mL). A negative result must be combined with clinical observations, patient history, and epidemiological information. The expected result is  Negative.  Fact Sheet for Patients:  EntrepreneurPulse.com.au  Fact Sheet for Healthcare Providers:  IncredibleEmployment.be  This test is no t yet approved or cleared by the [place] FDA and  has been authorized for detection and/or diagnosis of SARS-CoV-2 by FDA under an Emergency Use Authorization (EUA). This EUA will remain  in effect (meaning this test can be used) for the duration of the  COVID-19 declaration under Section 564(b)(1) of the Act, 21 U.S.C.section 360bbb-3(b)(1), unless the authorization is terminated  or revoked sooner.       Influenza A by PCR NEGATIVE NEGATIVE Final   Influenza B by PCR NEGATIVE NEGATIVE Final    Comment: (NOTE) The Xpert Xpress SARS-CoV-2/FLU/RSV plus assay is intended as an aid in the diagnosis of influenza from Nasopharyngeal swab specimens and should not be used as a sole basis for treatment. Nasal washings and aspirates are unacceptable for Xpert Xpress SARS-CoV-2/FLU/RSV testing.  Fact Sheet for Patients: EntrepreneurPulse.com.au  Fact Sheet for Healthcare Providers: IncredibleEmployment.be  This test is not yet approved or cleared by the [place] FDA and has been authorized for detection and/or diagnosis of SARS-CoV-2 by FDA under an Emergency Use Authorization (EUA). This EUA will remain in effect (meaning this test can be used) for the duration of the COVID-19 declaration under Section 564(b)(1) of the Act, 21 U.S.C. section 360bbb-3(b)(1), unless the authorization is terminated or revoked.  Performed at [facility], [address], [place], [place] [address]   Culture, blood (routine x 2)     Status: None   Collection Time: 08/24/21 12:15 AM   Specimen: Right Antecubital; Blood  Result Value Ref Range Status   Specimen Description   Final    RIGHT ANTECUBITAL Performed at [facility] [address] [place], [place] [address]    Special Requests   Final    BOTTLES DRAWN AEROBIC ONLY Blood Culture adequate volume Performed at [facility] [address] [place], [place] [address]    Culture   Final    NO GROWTH 5 DAYS Performed at [facility], [place] [address] [place], [place] [address]    Report Status 08/29/2021 FINAL  Final  Culture, blood (routine x 2)     Status: None   Collection Time: 08/24/21 12:15 AM   Specimen: Left Antecubital; Blood  Result Value Ref Range Status   Specimen Description   Final    LEFT ANTECUBITAL Performed at [facility] [address] [place], [place] [address]    Special Requests   Final    BOTTLES DRAWN AEROBIC ONLY Blood Culture adequate volume Performed at [facility] [address], [place], [place] [address]    Culture   Final    NO GROWTH 5 DAYS Performed at [facility], [place] [address] [place], [place] [address]    Report Status 08/29/2021 FINAL  Final     Time coordinating discharge: Over 30 minutes  SIGNED:   Yuvaan Olander J [place] DO  [facility] 08/30/2021, 12:08 PM

## 2021-08-30 NOTE — TOC Progression Note (Addendum)
Transition of Care [facility]) - Progression Note    Patient Details  Name: [name] MRN: [id number] Date of Birth: 1951/02/16  [facility] CM/SW Contact  Leeroy Cha, RN Phone Number: 08/30/2021, 8:07 AM  Clinical Narrative:    Have rec'd passar number: [phone] E  [phone].  Will call family for choice of placement. Patient is considering [facility]. [facility] Hamper started. Auth obtained from [facility] is negative Patient now wants to go to [facility].  [facility]-message left as to reason for call and call back number. FACILITY CHANG[facility]. [facility] called for transport at 1500 Number to call report is [phone] Patient will go to room 124. Expected Discharge Plan: [place] Barriers to Discharge: Continued Medical Work up  Expected Discharge Plan and Services Expected Discharge Plan: [place]   Discharge Planning Services: CM Consult   Living arrangements for the past 2 months: Single Family Home                                       Social Determinants of Health (SDOH) Interventions    Readmission Risk Interventions No flowsheet data found.

## 2021-08-30 NOTE — Progress Notes (Signed)
Occupational Therapy Treatment Patient Details Name: [name] MRN: [id number] DOB: 06-01-1951 Today's Date: 08/30/2021   History of present illness 70 y.o. female presenting with difficulty ambulating, bilateral LE pain and swelling, acute encephalopathy,SIRS. MRI: rigtht ankle--non specific acute findings. 11/23 pt c/o more of right knee pain due to positioning in MRI machine.PHMx: asthma, arthritis, sarcodisosis, depression, and other medical problems.   OT comments  Pt progressing towards established OT. Pt performing stand pivot to [facility] with Min-Mod A and RW. Pt requiring Total A for peri care after urination. Pt with decreased function due to poor balance, strength, shoulder/wrist ROM, and significant pain. Despite pain, pt motivated to participate in therapy. Continue to recommend dc to SNF and will continue to follow acutely as admitted.    Recommendations for follow up therapy are one component of a multi-disciplinary discharge planning process, led by the attending physician.  Recommendations may be updated based on patient status, additional functional criteria and insurance authorization.    Follow Up Recommendations  Skilled nursing-short term rehab (<3 hours/day)    Assistance Recommended at Discharge Frequent or constant Supervision/Assistance  Equipment Recommendations  BSC/3in1    Recommendations for Other Services      Precautions / Restrictions Precautions Precautions: Fall       Mobility Bed Mobility Overal bed mobility: Needs Assistance Bed Mobility: Supine to Sit   Sidelying to sit: Mod assist       General bed mobility comments: Mod A for managing BLEs and then hips with bed pad    Transfers Overall transfer level: Needs assistance Equipment used: Rolling walker (2 wheels) Transfers: Sit to/from Stand;Bed to chair/wheelchair/BSC Sit to Stand: Mod assist   Step pivot transfers: Min assist       General transfer comment: Mod A for power up  and then MI nA for maintianing balance to pivot to [facility]     Balance Overall balance assessment: Needs assistance Sitting-balance support: Feet supported;No upper extremity supported Sitting balance-Leahy Scale: Good     Standing balance support: Bilateral upper extremity supported;Reliant on assistive device for balance Standing balance-Leahy Scale: Poor                             ADL either performed or assessed with clinical judgement   ADL Overall ADL's : Needs assistance/impaired     Grooming: Wash/dry face;Supervision/safety;Set up;Sitting Grooming Details (indicate cue type and reason): Supervision for safety             Lower Body Dressing: Maximal assistance Lower Body Dressing Details (indicate cue type and reason): don socks Toilet Transfer: Moderate assistance;Stand-pivot;Minimal assistance;BSC/3in1;Rolling walker (2 wheels) Toilet Transfer Details (indicate cue type and reason): Mod A for power up and then MIn A for balance during pivot Toileting- Clothing Manipulation and Hygiene: Total assistance Toileting - Clothing Manipulation Details (indicate cue type and reason): Mod A for sit<>Stand and then Total A for peri care.     Functional mobility during ADLs: Minimal assistance;Moderate assistance;Rolling walker (2 wheels) (stand pivot) General ADL Comments: Pt presenting with decreased balance, strength, and activity tolerance. Pain is limiting but pt very motivated.    Extremity/Trunk Assessment Upper Extremity Assessment Upper Extremity Assessment: LUE deficits/detail;RUE deficits/detail RUE Deficits / Details: Decreased shoulder AROM (says arms are sore because she has been relying so heavily on them since her feet have been hurting) LUE Deficits / Details: Decreased shoulder AROM (says arms are sore because she has been relying so  heavily on them since her feet have been hurting)   Lower Extremity Assessment Lower Extremity Assessment: Defer to  PT evaluation        Vision       Perception     Praxis      Cognition Arousal/Alertness: Awake/alert Behavior During Therapy: WFL for tasks assessed/performed Overall Cognitive Status: Within Functional Limits for tasks assessed                                 General Comments: polite and cooperative          Exercises     Shoulder Instructions       General Comments Pt enjoys listening to [employer] - "get my mind off it"    Pertinent Vitals/ Pain       Pain Assessment: Faces Faces Pain Scale: Hurts even more Pain Location: right knee, right shoulder Pain Descriptors / Indicators: Aching;Sore Pain Intervention(s): Monitored during session;Limited activity within patient's tolerance;Repositioned  Home Living                                          Prior Functioning/Environment              Frequency  Min 2X/week        Progress Toward Goals  OT Goals(current goals can now be found in the care plan section)  Progress towards OT goals: Progressing toward goals  Acute Rehab OT Goals OT Goal Formulation: With patient Time For Goal Achievement: 09/07/21 Potential to Achieve Goals: Good ADL Goals Pt Will Perform Grooming: Independently;standing Pt Will Perform Upper Body Bathing: Independently;standing;sitting Pt Will Perform Lower Body Bathing: Independently;sit to/from stand Pt Will Perform Upper Body Dressing: Independently;sitting Pt Will Perform Lower Body Dressing: Independently;sit to/from stand Pt Will Transfer to Toilet: Independently;ambulating;bedside commode (over toilet) Pt Will Perform Toileting - Clothing Manipulation and hygiene: Independently;sit to/from stand Additional ADL Goal #1: Pt will be independent in and OOB for basic ADLs  Plan Discharge plan remains appropriate    Co-evaluation                 AM-PAC OT "6 Clicks" Daily Activity     Outcome Measure   Help from another  person eating meals?: None Help from another person taking care of personal grooming?: A Little Help from another person toileting, which includes using toliet, bedpan, or urinal?: A Lot Help from another person bathing (including washing, rinsing, drying)?: A Lot Help from another person to put on and taking off regular upper body clothing?: A Little Help from another person to put on and taking off regular lower body clothing?: A Lot 6 Click Score: 16    End of Session Equipment Utilized During Treatment: Gait belt;Rolling walker (2 wheels)  OT Visit Diagnosis: Unsteadiness on feet (R26.81);Other abnormalities of gait and mobility (R26.89);Muscle weakness (generalized) (M62.81);Pain Pain - Right/Left: Right Pain - part of body: Knee   Activity Tolerance Patient tolerated treatment well;Patient limited by pain   Patient Left in chair;with call bell/phone within reach;with chair alarm set;with nursing/sitter in room   Nurse Communication Mobility status        Time: 6812-7517 OT Time Calculation (min): 32 min  Charges: OT General Charges $OT Visit: 1 Visit OT Treatments $Self Care/Home Management : 23-37 mins  Jesenia Spera MSOT, OTR/L  Acute Rehab Pager: [phone] Office: [place] 08/30/2021, 10:43 AM

## 2021-08-30 NOTE — Progress Notes (Signed)
PROGRESS NOTE    [name]  WVP:[id number] DOB: [birth year] DOA: 08/23/2021 PCP: [name], MD    Brief Narrative:  [name] is a 70 year old female with past medical history significant for asthma, osteoarthritis, sarcoidosis, depression who presented to [facility] ED on 11/22 with difficulty ambulating, bilateral lower extremity pain and swelling.  Patient reports symptoms have been ongoing for roughly 1 month. Patient reports recently has been taking doxycycline and clindamycin for symptoms without relief.  Patient reports difficulty ambulating now causing her to "crawl on the floor" and she cannot walk because of the swelling "makes her feet numb".  She also reports right shoulder pain from a scooting on the floor.  Patient previously use furniture to get around the house and denied any history of using a cane/walker.  Patient follows with orthopedics outpatient, Dr. Marlou Sa for her severe osteoarthritis of her knees.  In the ED, temperature 98.6 F, HR 98, RR 22, BP 131/86, SPO2 98% on room air.  Sodium 139, potassium 4.2, chloride 104, CO2 26, glucose 129, BUN 15, creatinine 0.56, AST 59, ALT 30, total bilirubin 0.2.  BNP 13.0.  WBC 9.1, hemoglobin 9.9, platelets of 45.  COVID-19 PCR negative.  Fluenz A/B PCR negative.  Hospital consulted for further evaluation management of intractable knee/foot pain with cellulitis and outpatient failed treatment.   Assessment & Plan:   Principal Problem:   Acute encephalopathy Active Problems:   SARCOIDOSIS   Asthma   Knee pain   Foot pain   Fall   Difficulty in walking   Bilateral lower extremity edema   Hormone replacement therapy   SIRS (systemic inflammatory response syndrome) (HCC)   Pressure injury of skin   Acute metabolic encephalopathy, POA: Resolved Etiology likely secondary to medication side effect with narcotics.  UDS negative.  EtOH level less than 10.  CT head without contrast with no acute intracranial pathology,  signs of atrophy and chronic microvascular ischemic changes.  Now resolved and alert and oriented.  Right foot cellulitis Lactic acidosis: Resolved MR right foot with marked subcutaneous soft tissue swelling/edema/fluid involving the entire foot most notably along the dorsum of the foot likely severe cellulitis without discrete drainable soft tissue abscess, marked marrow edema involving the distal calcaneus and adjacent cuboid could be degenerative versus septic arthritis versus osteomyelitis.  Patient reports failed outpatient treatment with doxycycline and clindamycin.  Patient was evaluated by orthopedics, Dr. Marlou Sa and surgical foot infection appears unlikely. --Blood cultures x2: no growth x 5 days --CRP 14.5>10.2 --ESR 85>99 --Lactic Acid 2.2>1.3 --Cefazolin 2 g IV every 8 hours; plan 7d course abx (End: 11/29) --Supportive care, pain control  Chronic pain bilateral knees/feet Patient reports progressive pain to bilateral knees and feet.  Follow-up with orthopedics outpatient, Dr. Marlou Sa.  X-ray left/right foot with severe end-stage tricompartmental arthritis and no acute fracture. Left foot x-ray with no acute osseous abnormality.  Right foot x-ray with no acute osseous abnormality. --Oxycodone 5 mg p.o. q4h PRN moderate pain --Outpatient follow-up with orthopedics  Bilateral lower extremity edema TTE with LVEF 70-75%, LV with hyperdynamic function, no LV regional wall motion normalities, normal LV diastolic parameters, IVC normal in size. Vascular duplex ultrasound bilateral lower extremities negative for DVT.  Ambulatory dysfunction --PT/OT recommending SNF, TOC for placement  Asthma --Dulera 200-5 mcg 2 puffs twice daily --duonebs prn   Depression:  --Fluoxetine increased to 20 mg p.o. daily --Seroquel 12.5 mg p.o. nightly  Ethics: Family concerned about patient's safety upon returning home  as there is poor living conditions reported.  Patient was evaluated by psychiatry and  has capacity for medical decision-making at this time.  MoCA performed on 08/28/2021 28/30. --Social work and psychiatry following --Outpatient referral placed for neuropsych testing   DVT prophylaxis: enoxaparin (LOVENOX) injection 40 mg Start: 08/23/21 2200   Code Status: Full Code Family Communication: No family present at bedside this morning. Disposition Plan:  Level of care: Telemetry Status is: Inpatient  Remains inpatient appropriate because: Awaiting PASSR number and SNF placement.   Consultants:  Orthopedics, Dr. Marlou Sa  Procedures:  TTE  Antimicrobials:  Cefazolin 11/23>>    Subjective: Patient seen examined bedside, resting calmly.  Lying in bed.  No family present this morning.  Patient complains of "pain all over".  No other specific complaints or concerns at this time.   Denies headache, no dizziness, no nausea/vomiting/diarrhea, no fever/chills/night sweats, no chest pain, no shortness of breath, no abdominal pain, no weakness, no fatigue.  No acute events overnight per nursing staff. Awaiting PASSR number and SNF placement; medically stable for discharge.  Objective: Vitals:   08/30/21 0019 08/30/21 0456 08/30/21 0500 08/30/21 0833  BP: 116/63 (!) 151/76    Pulse: (!) 110 (!) 105    Resp: 20 20    Temp: 98.1 F (36.7 C) 98.2 F (36.8 C)    TempSrc: Oral Oral    SpO2: 96% 97%  95%  Weight:   99 kg   Height:        Intake/Output Summary (Last 24 hours) at 08/30/2021 1052 Last data filed at 08/30/2021 0522 Gross per 24 hour  Intake --  Output 600 ml  Net -600 ml   Filed Weights   08/28/21 0638 08/29/21 0555 08/30/21 0500  Weight: 99 kg 98.1 kg 99 kg    Examination:  General exam: Appears calm and comfortable  Respiratory system: Clear to auscultation. Respiratory effort normal.  On room air Cardiovascular system: S1 & S2 heard, RRR. No JVD, murmurs, rubs, gallops or clicks. No pedal edema. Gastrointestinal system: Abdomen is nondistended, soft  and nontender. No organomegaly or masses felt. Normal bowel sounds heard. Central nervous system: Alert and oriented x 4. No focal neurological deficits. Extremities: Tenderness to palpation dorsal surface right foot, erythema now resolved, normal range of motion and preserved strength Skin: No rashes, lesions or ulcers Psychiatry: Judgement and insight appear normal. Mood & affect appropriate.     Data Reviewed: I have personally reviewed following labs and imaging studies  CBC: Recent Labs  Lab 08/24/21 0230 08/25/21 0347  WBC 9.5 8.9  HGB 9.4* 8.9*  HCT 29.5* 28.7*  MCV 92.5 91.7  PLT 426* 952*   Basic Metabolic Panel: Recent Labs  Lab 08/24/21 0230 08/25/21 0347 08/30/21 0350  NA 137 138  --   K 4.1 3.7  --   CL 104 104  --   CO2 27 27  --   GLUCOSE 109* 111*  --   BUN 10 8  --   CREATININE 0.61 0.59 0.57  CALCIUM 8.2* 8.2*  --    GFR: Estimated Creatinine Clearance: 69.9 mL/min (by C-G formula based on SCr of 0.57 mg/dL). Liver Function Tests: Recent Labs  Lab 08/24/21 0230  AST 55*  ALT 30  ALKPHOS 59  BILITOT 0.2*  PROT 5.9*  ALBUMIN 2.4*   No results for input(s): LIPASE, AMYLASE in the last 168 hours. Recent Labs  Lab 08/23/21 1057  AMMONIA 20   Coagulation Profile: No results for input(s):  INR, PROTIME in the last 168 hours. Cardiac Enzymes: Recent Labs  Lab 08/23/21 1754  CKTOTAL 1,460*   BNP (last 3 results) Recent Labs    08/17/21 1322  PROBNP 3.0   HbA1C: No results for input(s): HGBA1C in the last 72 hours. CBG: No results for input(s): GLUCAP in the last 168 hours. Lipid Profile: No results for input(s): CHOL, HDL, LDLCALC, TRIG, CHOLHDL, LDLDIRECT in the last 72 hours. Thyroid Function Tests: No results for input(s): TSH, T4TOTAL, FREET4, T3FREE, THYROIDAB in the last 72 hours. Anemia Panel: No results for input(s): VITAMINB12, FOLATE, FERRITIN, TIBC, IRON, RETICCTPCT in the last 72 hours. Sepsis Labs: Recent Labs  Lab  08/24/21 0015 08/24/21 0230  LATICACIDVEN 2.2* 1.3    Recent Results (from the past 240 hour(s))  Resp Panel by RT-PCR (Flu A&B, Covid) Nasopharyngeal Swab     Status: None   Collection Time: 08/23/21 11:02 AM   Specimen: Nasopharyngeal Swab; Nasopharyngeal(NP) swabs in vial transport medium  Result Value Ref Range Status   SARS Coronavirus 2 by RT PCR NEGATIVE NEGATIVE Final    Comment: (NOTE) SARS-CoV-2 target nucleic acids are NOT DETECTED.  The SARS-CoV-2 RNA is generally detectable in upper respiratory specimens during the acute phase of infection. The lowest concentration of SARS-CoV-2 viral copies this assay can detect is 138 copies/mL. A negative result does not preclude SARS-Cov-2 infection and should not be used as the sole basis for treatment or other patient management decisions. A negative result may occur with  improper specimen collection/handling, submission of specimen other than nasopharyngeal swab, presence of viral mutation(s) within the areas targeted by this assay, and inadequate number of viral copies(<138 copies/mL). A negative result must be combined with clinical observations, patient history, and epidemiological information. The expected result is Negative.  Fact Sheet for Patients:  EntrepreneurPulse.com.au  Fact Sheet for Healthcare Providers:  IncredibleEmployment.be  This test is no t yet approved or cleared by the [place] FDA and  has been authorized for detection and/or diagnosis of SARS-CoV-2 by FDA under an Emergency Use Authorization (EUA). This EUA will remain  in effect (meaning this test can be used) for the duration of the COVID-19 declaration under Section 564(b)(1) of the Act, 21 U.S.C.section 360bbb-3(b)(1), unless the authorization is terminated  or revoked sooner.       Influenza A by PCR NEGATIVE NEGATIVE Final   Influenza B by PCR NEGATIVE NEGATIVE Final    Comment: (NOTE) The Xpert  Xpress SARS-CoV-2/FLU/RSV plus assay is intended as an aid in the diagnosis of influenza from Nasopharyngeal swab specimens and should not be used as a sole basis for treatment. Nasal washings and aspirates are unacceptable for Xpert Xpress SARS-CoV-2/FLU/RSV testing.  Fact Sheet for Patients: EntrepreneurPulse.com.au  Fact Sheet for Healthcare Providers: IncredibleEmployment.be  This test is not yet approved or cleared by the [place] FDA and has been authorized for detection and/or diagnosis of SARS-CoV-2 by FDA under an Emergency Use Authorization (EUA). This EUA will remain in effect (meaning this test can be used) for the duration of the COVID-19 declaration under Section 564(b)(1) of the Act, 21 U.S.C. section 360bbb-3(b)(1), unless the authorization is terminated or revoked.  Performed at [facility], [address] [place], [place] [address]   Culture, blood (routine x 2)     Status: None   Collection Time: 08/24/21 12:15 AM   Specimen: Right Antecubital; Blood  Result Value Ref Range Status   Specimen Description   Final    RIGHT  ANTECUBITAL Performed at [facility], [place] [address], [place], [place] [address]    Special Requests   Final    BOTTLES DRAWN AEROBIC ONLY Blood Culture adequate volume Performed at [facility] [address] [place], [place] [address]    Culture   Final    NO GROWTH 5 DAYS Performed at [facility], [place] [address], [place], [place] [address]    Report Status 08/29/2021 FINAL  Final  Culture, blood (routine x 2)     Status: None   Collection Time: 08/24/21 12:15 AM   Specimen: Left Antecubital; Blood  Result Value Ref Range Status   Specimen Description   Final    LEFT ANTECUBITAL Performed at [facility] [address], [place], [place] [address]    Special Requests   Final    BOTTLES DRAWN AEROBIC ONLY  Blood Culture adequate volume Performed at [facility] [address] [place], [place] [address]    Culture   Final    NO GROWTH 5 DAYS Performed at [facility], [place] [address], [place], [place] [address]    Report Status 08/29/2021 FINAL  Final         Radiology Studies: No results found.      Scheduled Meds:  cycloSPORINE  1 drop Both Eyes BID   diclofenac Sodium  2 g Topical QID   enoxaparin (LOVENOX) injection  40 mg Subcutaneous Q24H   estradiol  1 mg Oral Daily   FLUoxetine  20 mg Oral Daily   fluticasone  1 spray Each Nare Daily   loratadine  5 mg Oral QPM   mometasone-formoterol  2 puff Inhalation BID   QUEtiapine  12.5 mg Oral QHS   Continuous Infusions:   ceFAZolin (ANCEF) IV 2 g (08/30/21 0553)     LOS: 6 days    Time spent: 35 minutes spent on chart review, discussion with nursing staff, consultants, updating family and interview/physical exam; more than 50% of that time was spent in counseling and/or coordination of care.    Saprina Chuong J [place] DO [facility] Available via Epic secure chat 7am-7pm After these hours, please refer to coverage provider listed on [email or web address] 08/30/2021, 10:52 AM

## 2021-08-31 DIAGNOSIS — H409 Unspecified glaucoma: Secondary | ICD-10-CM | POA: Diagnosis not present

## 2021-08-31 DIAGNOSIS — R262 Difficulty in walking, not elsewhere classified: Secondary | ICD-10-CM | POA: Diagnosis not present

## 2021-08-31 DIAGNOSIS — F331 Major depressive disorder, recurrent, moderate: Secondary | ICD-10-CM | POA: Diagnosis not present

## 2021-08-31 DIAGNOSIS — Z7409 Other reduced mobility: Secondary | ICD-10-CM | POA: Diagnosis not present

## 2021-08-31 DIAGNOSIS — R000 Tachycardia, unspecified: Secondary | ICD-10-CM | POA: Diagnosis not present

## 2021-08-31 DIAGNOSIS — R52 Pain, unspecified: Secondary | ICD-10-CM | POA: Diagnosis not present

## 2021-08-31 DIAGNOSIS — R440 Auditory hallucinations: Secondary | ICD-10-CM | POA: Diagnosis not present

## 2021-08-31 DIAGNOSIS — D72829 Elevated white blood cell count, unspecified: Secondary | ICD-10-CM | POA: Diagnosis not present

## 2021-08-31 DIAGNOSIS — M25562 Pain in left knee: Secondary | ICD-10-CM | POA: Diagnosis not present

## 2021-08-31 DIAGNOSIS — E46 Unspecified protein-calorie malnutrition: Secondary | ICD-10-CM | POA: Diagnosis not present

## 2021-08-31 DIAGNOSIS — R0989 Other specified symptoms and signs involving the circulatory and respiratory systems: Secondary | ICD-10-CM | POA: Diagnosis not present

## 2021-08-31 DIAGNOSIS — M25561 Pain in right knee: Secondary | ICD-10-CM | POA: Diagnosis present

## 2021-08-31 DIAGNOSIS — Z8616 Personal history of COVID-19: Secondary | ICD-10-CM | POA: Diagnosis not present

## 2021-08-31 DIAGNOSIS — J45998 Other asthma: Secondary | ICD-10-CM | POA: Diagnosis not present

## 2021-08-31 DIAGNOSIS — R600 Localized edema: Secondary | ICD-10-CM | POA: Diagnosis not present

## 2021-08-31 DIAGNOSIS — Z7401 Bed confinement status: Secondary | ICD-10-CM | POA: Diagnosis not present

## 2021-08-31 DIAGNOSIS — U071 COVID-19: Secondary | ICD-10-CM | POA: Diagnosis not present

## 2021-08-31 DIAGNOSIS — R0981 Nasal congestion: Secondary | ICD-10-CM | POA: Diagnosis not present

## 2021-08-31 DIAGNOSIS — G3184 Mild cognitive impairment, so stated: Secondary | ICD-10-CM | POA: Diagnosis not present

## 2021-08-31 DIAGNOSIS — G894 Chronic pain syndrome: Secondary | ICD-10-CM | POA: Diagnosis not present

## 2021-08-31 DIAGNOSIS — R051 Acute cough: Secondary | ICD-10-CM | POA: Diagnosis not present

## 2021-08-31 DIAGNOSIS — M25511 Pain in right shoulder: Secondary | ICD-10-CM | POA: Diagnosis not present

## 2021-08-31 DIAGNOSIS — J309 Allergic rhinitis, unspecified: Secondary | ICD-10-CM | POA: Diagnosis not present

## 2021-08-31 DIAGNOSIS — L89322 Pressure ulcer of left buttock, stage 2: Secondary | ICD-10-CM | POA: Diagnosis not present

## 2021-08-31 DIAGNOSIS — M25569 Pain in unspecified knee: Secondary | ICD-10-CM | POA: Diagnosis not present

## 2021-08-31 DIAGNOSIS — D649 Anemia, unspecified: Secondary | ICD-10-CM | POA: Diagnosis not present

## 2021-08-31 DIAGNOSIS — M170 Bilateral primary osteoarthritis of knee: Secondary | ICD-10-CM | POA: Diagnosis not present

## 2021-08-31 DIAGNOSIS — M542 Cervicalgia: Secondary | ICD-10-CM | POA: Diagnosis not present

## 2021-08-31 DIAGNOSIS — R531 Weakness: Secondary | ICD-10-CM | POA: Diagnosis not present

## 2021-08-31 DIAGNOSIS — M179 Osteoarthritis of knee, unspecified: Secondary | ICD-10-CM | POA: Diagnosis not present

## 2021-08-31 DIAGNOSIS — J45909 Unspecified asthma, uncomplicated: Secondary | ICD-10-CM | POA: Diagnosis not present

## 2021-08-31 DIAGNOSIS — R5383 Other fatigue: Secondary | ICD-10-CM | POA: Diagnosis not present

## 2021-08-31 DIAGNOSIS — R5381 Other malaise: Secondary | ICD-10-CM | POA: Diagnosis not present

## 2021-08-31 DIAGNOSIS — M62838 Other muscle spasm: Secondary | ICD-10-CM | POA: Diagnosis not present

## 2021-08-31 DIAGNOSIS — G5603 Carpal tunnel syndrome, bilateral upper limbs: Secondary | ICD-10-CM | POA: Diagnosis not present

## 2021-08-31 DIAGNOSIS — R410 Disorientation, unspecified: Secondary | ICD-10-CM | POA: Diagnosis not present

## 2021-08-31 DIAGNOSIS — M25512 Pain in left shoulder: Secondary | ICD-10-CM | POA: Diagnosis not present

## 2021-08-31 DIAGNOSIS — J069 Acute upper respiratory infection, unspecified: Secondary | ICD-10-CM | POA: Diagnosis not present

## 2021-08-31 DIAGNOSIS — K5903 Drug induced constipation: Secondary | ICD-10-CM | POA: Diagnosis not present

## 2021-08-31 DIAGNOSIS — R2689 Other abnormalities of gait and mobility: Secondary | ICD-10-CM | POA: Diagnosis not present

## 2021-08-31 DIAGNOSIS — M6281 Muscle weakness (generalized): Secondary | ICD-10-CM | POA: Diagnosis not present

## 2021-08-31 LAB — CBC
HCT: 28 % — ABNORMAL LOW (ref 36.0–46.0)
Hemoglobin: 8.8 g/dL — ABNORMAL LOW (ref 12.0–15.0)
MCH: 28.8 pg (ref 26.0–34.0)
MCHC: 31.4 g/dL (ref 30.0–36.0)
MCV: 91.5 fL (ref 80.0–100.0)
Platelets: 505 10*3/uL — ABNORMAL HIGH (ref 150–400)
RBC: 3.06 MIL/uL — ABNORMAL LOW (ref 3.87–5.11)
RDW: 15.8 % — ABNORMAL HIGH (ref 11.5–15.5)
WBC: 11 10*3/uL — ABNORMAL HIGH (ref 4.0–10.5)
nRBC: 0 % (ref 0.0–0.2)

## 2021-08-31 LAB — BASIC METABOLIC PANEL
Anion gap: 7 (ref 5–15)
BUN: 8 mg/dL (ref 8–23)
CO2: 27 mmol/L (ref 22–32)
Calcium: 8.3 mg/dL — ABNORMAL LOW (ref 8.9–10.3)
Chloride: 102 mmol/L (ref 98–111)
Creatinine, Ser: 0.53 mg/dL (ref 0.44–1.00)
GFR, Estimated: >60 mL/min (ref >60)
Glucose, Bld: 112 mg/dL — ABNORMAL HIGH (ref 70–99)
Potassium: 4.1 mmol/L (ref 3.5–5.1)
Sodium: 136 mmol/L (ref 135–145)

## 2021-08-31 NOTE — Plan of Care (Signed)
  Problem: Education: Goal: Knowledge of General Education information will improve Description Including pain rating scale, medication(s)/side effects and non-pharmacologic comfort measures Outcome: Progressing   

## 2021-08-31 NOTE — TOC Transition Note (Signed)
Transition of Care [facility]) - CM/SW Discharge Note   Patient Details  Name: [name] MRN: [id number] Date of Birth: [birth year]  Transition of Care [facility]) CM/SW Contact:  Leeroy Cha, RN Phone Number: 08/31/2021, 7:54 AM   Clinical Narrative:    Ptar kdid not arrive to get patient until 7 am today.  Patient wanted to eat breakfast first ptar stated that they would be back around [phone] to pick patient up.   Final next level of care: Skilled Nursing Facility Barriers to Discharge: Barriers Resolved   Patient Goals and CMS Choice Patient states their goals for this hospitalization and ongoing recovery are:: i do not know right now i guess to go home CMS Medicare.gov Compare Post Acute Care list provided to:: Patient    Discharge Placement                       Discharge Plan and Services   Discharge Planning Services: CM Consult                                 Social Determinants of Health (SDOH) Interventions     Readmission Risk Interventions No flowsheet data found.

## 2021-09-05 DIAGNOSIS — L89322 Pressure ulcer of left buttock, stage 2: Secondary | ICD-10-CM | POA: Diagnosis not present

## 2021-09-06 DIAGNOSIS — Z7409 Other reduced mobility: Secondary | ICD-10-CM | POA: Diagnosis not present

## 2021-09-06 DIAGNOSIS — R0981 Nasal congestion: Secondary | ICD-10-CM | POA: Diagnosis not present

## 2021-09-06 DIAGNOSIS — K5903 Drug induced constipation: Secondary | ICD-10-CM | POA: Diagnosis not present

## 2021-09-06 DIAGNOSIS — G894 Chronic pain syndrome: Secondary | ICD-10-CM | POA: Diagnosis not present

## 2021-09-06 DIAGNOSIS — M170 Bilateral primary osteoarthritis of knee: Secondary | ICD-10-CM | POA: Diagnosis not present

## 2021-09-06 DIAGNOSIS — M6281 Muscle weakness (generalized): Secondary | ICD-10-CM | POA: Diagnosis not present

## 2021-09-07 ENCOUNTER — Telehealth: Payer: Self-pay

## 2021-09-07 DIAGNOSIS — J069 Acute upper respiratory infection, unspecified: Secondary | ICD-10-CM | POA: Diagnosis not present

## 2021-09-07 DIAGNOSIS — M170 Bilateral primary osteoarthritis of knee: Secondary | ICD-10-CM | POA: Diagnosis not present

## 2021-09-07 DIAGNOSIS — R0981 Nasal congestion: Secondary | ICD-10-CM | POA: Diagnosis not present

## 2021-09-07 DIAGNOSIS — G894 Chronic pain syndrome: Secondary | ICD-10-CM | POA: Diagnosis not present

## 2021-09-07 NOTE — Telephone Encounter (Signed)
Approved for Monovisc, bilateral knee. [place] Once OOP has been met, patient will be covered at 100%. Co-pay of $40.00 [place] Approval# [id number] Valid 09/16/2021-12/15/2021  Appt. 09/19/2021

## 2021-09-09 DIAGNOSIS — R0981 Nasal congestion: Secondary | ICD-10-CM | POA: Diagnosis not present

## 2021-09-09 DIAGNOSIS — R051 Acute cough: Secondary | ICD-10-CM | POA: Diagnosis not present

## 2021-09-09 DIAGNOSIS — U071 COVID-19: Secondary | ICD-10-CM | POA: Diagnosis not present

## 2021-09-13 DIAGNOSIS — L89322 Pressure ulcer of left buttock, stage 2: Secondary | ICD-10-CM | POA: Diagnosis not present

## 2021-09-13 DIAGNOSIS — U071 COVID-19: Secondary | ICD-10-CM | POA: Diagnosis not present

## 2021-09-13 DIAGNOSIS — G894 Chronic pain syndrome: Secondary | ICD-10-CM | POA: Diagnosis not present

## 2021-09-13 DIAGNOSIS — R0981 Nasal congestion: Secondary | ICD-10-CM | POA: Diagnosis not present

## 2021-09-13 DIAGNOSIS — M170 Bilateral primary osteoarthritis of knee: Secondary | ICD-10-CM | POA: Diagnosis not present

## 2021-09-14 DIAGNOSIS — U071 COVID-19: Secondary | ICD-10-CM | POA: Diagnosis not present

## 2021-09-14 DIAGNOSIS — M170 Bilateral primary osteoarthritis of knee: Secondary | ICD-10-CM | POA: Diagnosis not present

## 2021-09-14 DIAGNOSIS — M62838 Other muscle spasm: Secondary | ICD-10-CM | POA: Diagnosis not present

## 2021-09-14 DIAGNOSIS — M542 Cervicalgia: Secondary | ICD-10-CM | POA: Diagnosis not present

## 2021-09-14 DIAGNOSIS — G894 Chronic pain syndrome: Secondary | ICD-10-CM | POA: Diagnosis not present

## 2021-09-19 ENCOUNTER — Ambulatory Visit: Payer: Medicare PPO | Admitting: Orthopedic Surgery

## 2021-09-19 ENCOUNTER — Telehealth: Payer: Self-pay | Admitting: Orthopedic Surgery

## 2021-09-19 NOTE — Telephone Encounter (Signed)
Spoke with patient she advised she's still in quarantine at [employer].I advised I need to reschedule her appointment being that she is still in quarantine.   Patient advised quarantine will be over tomorrow and would like to get back on Dr. Forbes Cellar schedule this week for the injections. The number to contact patient is [phone]

## 2021-09-20 DIAGNOSIS — U071 COVID-19: Secondary | ICD-10-CM | POA: Diagnosis not present

## 2021-09-20 DIAGNOSIS — M6281 Muscle weakness (generalized): Secondary | ICD-10-CM | POA: Diagnosis not present

## 2021-09-20 DIAGNOSIS — R0989 Other specified symptoms and signs involving the circulatory and respiratory systems: Secondary | ICD-10-CM | POA: Diagnosis not present

## 2021-09-20 DIAGNOSIS — M170 Bilateral primary osteoarthritis of knee: Secondary | ICD-10-CM | POA: Diagnosis not present

## 2021-09-20 DIAGNOSIS — R52 Pain, unspecified: Secondary | ICD-10-CM | POA: Diagnosis not present

## 2021-09-20 DIAGNOSIS — G894 Chronic pain syndrome: Secondary | ICD-10-CM | POA: Diagnosis not present

## 2021-09-20 DIAGNOSIS — R051 Acute cough: Secondary | ICD-10-CM | POA: Diagnosis not present

## 2021-09-20 NOTE — Telephone Encounter (Signed)
IC rescheduled 

## 2021-09-21 ENCOUNTER — Ambulatory Visit: Payer: Medicare PPO | Admitting: Orthopedic Surgery

## 2021-09-22 ENCOUNTER — Other Ambulatory Visit: Payer: Self-pay

## 2021-09-22 ENCOUNTER — Ambulatory Visit (INDEPENDENT_AMBULATORY_CARE_PROVIDER_SITE_OTHER): Payer: Medicare PPO | Admitting: Orthopaedic Surgery

## 2021-09-22 ENCOUNTER — Encounter: Payer: Self-pay | Admitting: Orthopaedic Surgery

## 2021-09-22 DIAGNOSIS — M1712 Unilateral primary osteoarthritis, left knee: Secondary | ICD-10-CM

## 2021-09-22 DIAGNOSIS — M170 Bilateral primary osteoarthritis of knee: Secondary | ICD-10-CM | POA: Diagnosis not present

## 2021-09-22 DIAGNOSIS — M1711 Unilateral primary osteoarthritis, right knee: Secondary | ICD-10-CM

## 2021-09-22 MED ORDER — HYALURONAN 88 MG/4ML IX SOSY
88.0000 mg | PREFILLED_SYRINGE | INTRA_ARTICULAR | Status: AC | PRN
Start: 2021-09-22 — End: 2021-09-22
  Administered 2021-09-22: 15:00:00 88 mg via INTRA_ARTICULAR

## 2021-09-22 MED ORDER — HYALURONAN 88 MG/4ML IX SOSY
88.0000 mg | PREFILLED_SYRINGE | INTRA_ARTICULAR | Status: AC | PRN
  Administered 2021-09-22: 15:00:00 88 mg via INTRA_ARTICULAR

## 2021-09-22 NOTE — Progress Notes (Signed)
Office Visit Note   Patient: [name]           Date of Birth: [birth year]           MRN: [id number] Visit Date: 09/22/2021              Requested by: [name], MD [place],  [place] [address] PCP: [name], MD  No chief complaint on file.     HPI: Patient is a very pleasant 70 year old woman with bilateral knee osteoarthritis.  She has gotten Monovisc injections in the past by Dr. Marlou Sa.  Unfortunately she was unable to make her appointment she is here today with wondering if she can go forward with the injections that have already been approved.  She has not had any difficulty with the injections in the past  Assessment & Plan: Visit Diagnoses: No diagnosis found.  Plan: Pleasant 70 year old woman tolerated injected bilateral knees quite well.  She will follow-up with Dr. Marlou Sa if she has any concerns or if the injections do not last.  She is currently in a rehab facility.  She is hoping to get home as soon as she can  Follow-Up Instructions: No follow-ups on file.   Ortho Exam  Patient is alert, oriented, no adenopathy, well-dressed, normal affect, normal respiratory effort. Bilateral knees no effusion no cellulitis no redness tender over the medial lateral joint lines slight valgus alignment on the right clinically  Imaging: No results found. No images are attached to the encounter.  Labs: Lab Results  Component Value Date   HGBA1C 5.6 09/01/2020   ESRSEDRATE 99 (H) 08/25/2021   ESRSEDRATE 85 (H) 08/23/2021   CRP 10.2 (H) 08/25/2021   CRP 14.5 (H) 08/23/2021   LABURIC 4.1 08/24/2021   LABURIC 3.9 01/26/2021   REPTSTATUS 08/29/2021 FINAL 08/24/2021   REPTSTATUS 08/29/2021 FINAL 08/24/2021   CULT  08/24/2021    NO GROWTH 5 DAYS Performed at [facility], [place] [address], [place], [place] [address]    CULT  08/24/2021    NO GROWTH 5 DAYS Performed at [facility] [address] [place], [place] [address]       Lab Results  Component Value Date   ALBUMIN 2.4 (L) 08/24/2021   ALBUMIN 3.3 (L) 08/23/2021   ALBUMIN 3.6 07/29/2021    No results found for: MG Lab Results  Component Value Date   VD25OH 54.11 08/25/2021   VD25OH 14.22 (L) 05/26/2021   VD25OH 21 (L) 09/01/2020    No results found for: PREALBUMIN CBC EXTENDED Latest Ref Rng & Units 08/31/2021 08/25/2021 08/24/2021  WBC 4.0 - 10.5 K/uL 11.0(H) 8.9 9.5  RBC 3.87 - 5.11 MIL/uL 3.06(L) 3.13(L) 3.19(L)  HGB 12.0 - 15.0 g/dL 8.8(L) 8.9(L) 9.4(L)  HCT 36.0 - 46.0 % 28.0(L) 28.7(L) 29.5(L)  PLT 150 - 400 K/uL 505(H) 467(H) 426(H)  NEUTROABS 1.7 - 7.7 K/uL - - -  LYMPHSABS 0.7 - 4.0 K/uL - - -     There is no height or weight on file to calculate BMI.  Orders:  No orders of the defined types were placed in this encounter.  No orders of the defined types were placed in this encounter.    Procedures: Large Joint Inj: bilateral knee on 09/22/2021 2:34 PM Indications: pain and diagnostic evaluation Details: 25 G 1.5 in needle, anteromedial approach  Arthrogram: No  Medications (Right): 88 mg Hyaluronan 88 MG/4ML Medications (Left): 88 mg Hyaluronan 88 MG/4ML Outcome:  tolerated well, no immediate complications Procedure, treatment alternatives, risks and benefits explained, specific risks discussed. Consent was given by the patient.    Clinical Data: No additional findings.  ROS:  All other systems negative, except as noted in the HPI. Review of Systems  Objective: Vital Signs: There were no vitals taken for this visit.  Specialty Comments:  No specialty comments available.  PMFS History: Patient Active Problem List   Diagnosis Date Noted   Pressure injury of skin 08/27/2021   Fall 08/23/2021   Difficulty in walking 08/23/2021   Bilateral lower extremity edema 08/23/2021   Hormone replacement therapy 08/23/2021   Osteoporosis 01/28/2021   Osteoarthritis of right wrist 01/28/2021   Pincer nail  deformity 09/13/2020   Foot pain 09/13/2020   Knee pain 08/24/2018   Fall in home 07/14/2015   TIBIALIS TENDINITIS 08/25/2009   CHEST PAIN, OTHER, PAIN 10/21/2008   PEDAL EDEMA 07/22/2008   ANKLE PAIN, BILATERAL 04/01/2008   LOW BACK PAIN 04/01/2008   ACUTE BRONCHITIS 09/23/2007   SARCOIDOSIS 09/02/2007   Asthma 09/02/2007   Diverticulosis of colon 07/11/2007   BACK PAIN, CHRONIC 07/11/2007   Past Medical History:  Diagnosis Date   Acute bronchitis    Allergy    Anxiety    Arthritis    knees   Asthma    Backache, unspecified    Cataract    removed both eyes   Depression    Diverticulitis    Diverticulosis of colon (without mention of hemorrhage)    Edema    Glaucoma    Lumbago    Obesity    Other chest pain    Pain in joint, ankle and foot    Sarcoidosis    Tibialis tendinitis    Unspecified asthma(493.90)     Family History  Problem Relation Age of Onset   Hypertension Mother    Stroke Mother    Diabetes Father    Sarcoidosis Sister    Colon cancer Neg Hx    Colon polyps Neg Hx    Esophageal cancer Neg Hx    Rectal cancer Neg Hx    Stomach cancer Neg Hx     Past Surgical History:  Procedure Laterality Date   ABDOMINAL HYSTERECTOMY     CATARACT EXTRACTION W/PHACO Right 06/24/2014   Procedure: CATARACT EXTRACTION PHACO AND INTRAOCULAR LENS PLACEMENT ([name]) RIGHT EYE WITH GONIOSYNECHIALYSIS;  Surgeon: Marylynn Pearson, MD;  Location: [facility];  Service: Ophthalmology;  Laterality: Right;   COLONOSCOPY     EYE SURGERY Left    cataract surgery   HAND SURGERY     IR RADIOLOGIST EVAL & MGMT  11/14/2018   KNEE ARTHROSCOPY Right    Social History   Occupational History   Not on file  Tobacco Use   Smoking status: Never   Smokeless tobacco: Never  Substance and Sexual Activity   Alcohol use: Yes    Alcohol/week: 1.0 standard drink    Types: 1 Standard drinks or equivalent per week    Comment: social   Drug  use: No   Sexual activity: Not on file

## 2021-09-23 ENCOUNTER — Encounter (HOSPITAL_COMMUNITY): Payer: Self-pay | Admitting: Emergency Medicine

## 2021-09-23 ENCOUNTER — Emergency Department (HOSPITAL_COMMUNITY)
Admission: EM | Admit: 2021-09-23 | Discharge: 2021-09-24 | Disposition: A | Discharge status: Home / Self Care | Payer: Medicare PPO | Attending: Emergency Medicine | Admitting: Emergency Medicine

## 2021-09-23 ENCOUNTER — Emergency Department (HOSPITAL_COMMUNITY): Payer: Medicare PPO

## 2021-09-23 DIAGNOSIS — M25562 Pain in left knee: Secondary | ICD-10-CM | POA: Diagnosis not present

## 2021-09-23 DIAGNOSIS — M179 Osteoarthritis of knee, unspecified: Secondary | ICD-10-CM | POA: Insufficient documentation

## 2021-09-23 DIAGNOSIS — J45909 Unspecified asthma, uncomplicated: Secondary | ICD-10-CM | POA: Diagnosis not present

## 2021-09-23 DIAGNOSIS — D72829 Elevated white blood cell count, unspecified: Secondary | ICD-10-CM | POA: Insufficient documentation

## 2021-09-23 DIAGNOSIS — M25561 Pain in right knee: Secondary | ICD-10-CM

## 2021-09-23 DIAGNOSIS — M1990 Unspecified osteoarthritis, unspecified site: Secondary | ICD-10-CM

## 2021-09-23 DIAGNOSIS — M170 Bilateral primary osteoarthritis of knee: Secondary | ICD-10-CM | POA: Diagnosis not present

## 2021-09-23 DIAGNOSIS — R000 Tachycardia, unspecified: Secondary | ICD-10-CM | POA: Insufficient documentation

## 2021-09-23 LAB — CBC WITH DIFFERENTIAL/PLATELET
Abs Immature Granulocytes: 0.09 10*3/uL — ABNORMAL HIGH (ref 0.00–0.07)
Basophils Absolute: 0.1 10*3/uL (ref 0.0–0.1)
Basophils Relative: 1 %
Eosinophils Absolute: 0.4 10*3/uL (ref 0.0–0.5)
Eosinophils Relative: 3 %
HCT: 27.4 % — ABNORMAL LOW (ref 36.0–46.0)
Hemoglobin: 9 g/dL — ABNORMAL LOW (ref 12.0–15.0)
Immature Granulocytes: 1 %
Lymphocytes Relative: 12 %
Lymphs Abs: 1.8 10*3/uL (ref 0.7–4.0)
MCH: 29.2 pg (ref 26.0–34.0)
MCHC: 32.8 g/dL (ref 30.0–36.0)
MCV: 89 fL (ref 80.0–100.0)
Monocytes Absolute: 0.9 10*3/uL (ref 0.1–1.0)
Monocytes Relative: 6 %
Neutro Abs: 11.5 10*3/uL — ABNORMAL HIGH (ref 1.7–7.7)
Neutrophils Relative %: 77 %
Platelets: 571 10*3/uL — ABNORMAL HIGH (ref 150–400)
RBC: 3.08 MIL/uL — ABNORMAL LOW (ref 3.87–5.11)
RDW: 15.7 % — ABNORMAL HIGH (ref 11.5–15.5)
WBC: 14.8 10*3/uL — ABNORMAL HIGH (ref 4.0–10.5)
nRBC: 0 % (ref 0.0–0.2)

## 2021-09-23 LAB — BASIC METABOLIC PANEL
Anion gap: 8 (ref 5–15)
BUN: 7 mg/dL — ABNORMAL LOW (ref 8–23)
CO2: 26 mmol/L (ref 22–32)
Calcium: 8.6 mg/dL — ABNORMAL LOW (ref 8.9–10.3)
Chloride: 99 mmol/L (ref 98–111)
Creatinine, Ser: 0.54 mg/dL (ref 0.44–1.00)
GFR, Estimated: >60 mL/min (ref >60)
Glucose, Bld: 113 mg/dL — ABNORMAL HIGH (ref 70–99)
Potassium: 3.6 mmol/L (ref 3.5–5.1)
Sodium: 133 mmol/L — ABNORMAL LOW (ref 135–145)

## 2021-09-23 MED ORDER — DIPHENHYDRAMINE HCL 50 MG/ML IJ SOLN
12.5000 mg | Freq: Once | INTRAMUSCULAR | Status: AC
  Administered 2021-09-23: 12.5 mg via INTRAVENOUS
  Filled 2021-09-23: qty 1

## 2021-09-23 MED ORDER — HYDROMORPHONE HCL 1 MG/ML IJ SOLN
1.0000 mg | Freq: Once | INTRAMUSCULAR | Status: AC
  Administered 2021-09-23: 22:00:00 1 mg via INTRAVENOUS
  Filled 2021-09-23: qty 1

## 2021-09-23 MED ORDER — HYDROCODONE-ACETAMINOPHEN 5-325 MG PO TABS
1.0000 | ORAL_TABLET | Freq: Once | ORAL | Status: DC
  Filled 2021-09-23: qty 1

## 2021-09-23 MED ORDER — KETOROLAC TROMETHAMINE 15 MG/ML IJ SOLN
15.0000 mg | Freq: Once | INTRAMUSCULAR | Status: AC
  Administered 2021-09-23: 22:00:00 15 mg via INTRAVENOUS
  Filled 2021-09-23: qty 1

## 2021-09-23 MED ORDER — DEXAMETHASONE SODIUM PHOSPHATE 10 MG/ML IJ SOLN
10.0000 mg | Freq: Once | INTRAMUSCULAR | Status: AC
  Administered 2021-09-23: 10 mg via INTRAVENOUS
  Filled 2021-09-23: qty 1

## 2021-09-23 NOTE — ED Provider Notes (Addendum)
[facility] EMERGENCY DEPARTMENT Provider Note   CSN: [id number] Arrival date & time: 09/23/21  1201     History No chief complaint on file.   [name] is a 70 y.o. female.  Presented to ER with concern for bilateral knee pain.  Knee pain started after getting injection yesterday.  States that she has a history of chronic knee pain, states that she periodically gets knee injections.  Has not previously had pain after injection before.  Pain is moderate to severe, up to 10 out of 10 in severity in both of her knees.  At present right knee seems to be worse.  Pain is worse with movement and improved with rest.  She denies any numbness or weakness in her extremities.  She has not appreciated any significant swelling or redness over her knees.  No fevers or chills.    HPI     Past Medical History:  Diagnosis Date   Acute bronchitis    Allergy    Anxiety    Arthritis    knees   Asthma    Backache, unspecified    Cataract    removed both eyes   Depression    Diverticulitis    Diverticulosis of colon (without mention of hemorrhage)    Edema    Glaucoma    Lumbago    Obesity    Other chest pain    Pain in joint, ankle and foot    Sarcoidosis    Tibialis tendinitis    Unspecified asthma(493.90)     Patient Active Problem List   Diagnosis Date Noted   Pressure injury of skin 08/27/2021   Fall 08/23/2021   Difficulty in walking 08/23/2021   Bilateral lower extremity edema 08/23/2021   Hormone replacement therapy 08/23/2021   Osteoporosis 01/28/2021   Osteoarthritis of right wrist 01/28/2021   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   Foot pain 09/13/2020   Knee pain 08/24/2018   Fall in home 07/14/2015   TIBIALIS TENDINITIS 08/25/2009   CHEST PAIN, OTHER, PAIN 10/21/2008   PEDAL EDEMA 07/22/2008   ANKLE PAIN, BILATERAL 04/01/2008   LOW BACK PAIN 04/01/2008   ACUTE BRONCHITIS 09/23/2007   SARCOIDOSIS 09/02/2007   Asthma 09/02/2007    Diverticulosis of colon 07/11/2007   BACK PAIN, CHRONIC 07/11/2007    Past Surgical History:  Procedure Laterality Date   ABDOMINAL HYSTERECTOMY     CATARACT EXTRACTION W/PHACO Right 06/24/2014   Procedure: CATARACT EXTRACTION PHACO AND INTRAOCULAR LENS PLACEMENT ([place]) RIGHT EYE WITH GONIOSYNECHIALYSIS;  Surgeon: Marylynn Pearson, MD;  Location: [facility];  Service: Ophthalmology;  Laterality: Right;   COLONOSCOPY     EYE SURGERY Left    cataract surgery   HAND SURGERY     IR RADIOLOGIST EVAL & MGMT  11/14/2018   KNEE ARTHROSCOPY Right      OB History   No obstetric history on file.     Family History  Problem Relation Age of Onset   Hypertension Mother    Stroke Mother    Diabetes Father    Sarcoidosis Sister    Colon cancer Neg Hx    Colon polyps Neg Hx    Esophageal cancer Neg Hx    Rectal cancer Neg Hx    Stomach cancer Neg Hx     Social History   Tobacco Use   Smoking status: Never   Smokeless tobacco: Never  Substance Use Topics   Alcohol use: Yes    Alcohol/week: 1.0 standard drink  Types: 1 Standard drinks or equivalent per week    Comment: social   Drug use: No    Home Medications Prior to Admission medications   Medication Sig Start Date End Date Taking? Authorizing Provider  albuterol (VENTOLIN HFA) 108 (90 Base) MCG/ACT inhaler TAKE 2 PUFFS BY MOUTH EVERY 6 HOURS AS NEEDED Patient taking differently: Inhale 2 puffs into the lungs every 6 (six) hours as needed for shortness of breath or wheezing. 11/26/20   [name], MD  cycloSPORINE (RESTASIS) 0.05 % ophthalmic emulsion Restasis 0.05 % eye drops in a dropperette    [provider]  estradiol (ESTRACE) 1 MG tablet Take 1 tablet (1 mg total) by mouth daily. 06/11/12   Marletta Lor, MD  FLUoxetine (PROZAC) 20 MG capsule Take 1 capsule (20 mg total) by mouth daily. 08/31/21   [place] Eric J, DO  fluticasone (FLONASE) 50 MCG/ACT nasal spray Place 1 spray into both nostrils daily. 08/17/21    [name], MD  furosemide (LASIX) 40 MG tablet TAKE 0.5 TABLETS (20 MG TOTAL) BY MOUTH AS NEEDED (SWELLING). 08/22/21   [name], MD  Ginkgo Biloba (GINKOBA PO) Take 1 capsule by mouth daily.    [provider]  levocetirizine (XYZAL ALLERGY 24HR) 5 MG tablet Take 1 tablet (5 mg total) by mouth every evening. 08/17/21   [name], MD  meloxicam (MOBIC) 15 MG tablet Take 1 tablet (15 mg total) by mouth daily. 07/17/21   Magnant, Gerrianne Scale, PA-C  Multiple Vitamins-Minerals (MULTIVITAMIN WITH MINERALS) tablet Take 1 tablet by mouth daily.    [provider]  oxyCODONE (OXY IR/ROXICODONE) 5 MG immediate release tablet Take 1 tablet (5 mg total) by mouth every 4 (four) hours as needed for moderate pain. 08/30/21   [place] Eric J, DO  polyethylene glycol powder (GLYCOLAX/MIRALAX) 17 GM/SCOOP powder Take 17 g by mouth daily as needed. 08/17/21   [name], MD  QUEtiapine (SEROQUEL) 25 MG tablet Take 0.5 tablets (12.5 mg total) by mouth at bedtime. 08/30/21   [place] Donnamarie Poag, DO  Vitamin D, Ergocalciferol, (DRISDOL) 1.25 MG (50000 UNIT) CAPS capsule Take 1 capsule (50,000 Units total) by mouth every 7 (seven) days. 05/26/21   [name], MD  WIXELA INHUB 250-50 MCG/ACT AEPB INHALE 1 PUFF BY MOUTH TWICE A DAY Patient taking differently: Inhale 1 puff into the lungs in the morning and at bedtime. 06/07/21   [name], MD    Allergies    Ibuprofen and Sulfa antibiotics  Review of Systems   Review of Systems  Constitutional:  Negative for chills and fever.  HENT:  Negative for ear pain and sore throat.   Eyes:  Negative for pain and visual disturbance.  Respiratory:  Negative for cough and shortness of breath.   Cardiovascular:  Negative for chest pain and palpitations.  Gastrointestinal:  Negative for abdominal pain and vomiting.  Genitourinary:  Negative for dysuria and hematuria.  Musculoskeletal:  Positive for arthralgias. Negative for  back pain.  Skin:  Negative for color change and rash.  Neurological:  Negative for seizures and syncope.  All other systems reviewed and are negative.  Physical Exam Updated Vital Signs BP (!) 158/73    Pulse (!) 117    Temp 98.4 F (36.9 C) (Oral)    Resp (!) 22    SpO2 94%   Physical Exam Vitals and nursing note reviewed.  Constitutional:      General: She is not  in acute distress.    Appearance: She is well-developed.  HENT:     Head: Normocephalic and atraumatic.  Eyes:     Conjunctiva/sclera: Conjunctivae normal.  Cardiovascular:     Rate and Rhythm: Regular rhythm. Tachycardia present.     Heart sounds: No murmur heard. Pulmonary:     Effort: Pulmonary effort is normal. No respiratory distress.     Breath sounds: Normal breath sounds.  Abdominal:     Palpations: Abdomen is soft.     Tenderness: There is no abdominal tenderness.  Musculoskeletal:     Cervical back: Neck supple.     Comments: RLE: Knee has no significant joint effusion appreciated, there is no overlying erythema or warmth to touch, there is decreased range of motion secondary to pain, there is generalized tenderness to palpation over knee LLE: Knee has no significant joint effusion appreciated, there is no overlying erythema or warmth to touch, there is decreased range of motion secondary to pain, there is generalized tenderness to palpation over knee   Skin:    General: Skin is warm and dry.     Capillary Refill: Capillary refill takes less than 2 seconds.  Neurological:     Mental Status: She is alert.  Psychiatric:        Mood and Affect: Mood normal.    ED Results / Procedures / Treatments   Labs (all labs ordered are listed, but only abnormal results are displayed) Labs Reviewed  CBC WITH DIFFERENTIAL/PLATELET - Abnormal; Notable for the following components:      Result Value   WBC 14.8 (*)    RBC 3.08 (*)    Hemoglobin 9.0 (*)    HCT 27.4 (*)    RDW 15.7 (*)    Platelets 571 (*)     Neutro Abs 11.5 (*)    Abs Immature Granulocytes 0.09 (*)    All other components within normal limits  BASIC METABOLIC PANEL - Abnormal; Notable for the following components:   Sodium 133 (*)    Glucose, Bld 113 (*)    BUN 7 (*)    Calcium 8.6 (*)    All other components within normal limits    EKG EKG Interpretation  Date/Time:  Friday September 23 2021 22:17:46 EST Ventricular Rate:  114 PR Interval:  171 QRS Duration: 90 QT Interval:  322 QTC Calculation: 444 R Axis:   43 Text Interpretation: Sinus tachycardia Atrial premature complex Probable left atrial enlargement Confirmed by Madalyn Rob [phone]) on 09/24/2021 12:06:31 AM  Radiology DG Knee Complete 4 Views Left  Result Date: 09/23/2021 CLINICAL DATA:  Chronic bilateral knee pain. EXAM: LEFT KNEE - COMPLETE 4+ VIEW; RIGHT KNEE - COMPLETE 4+ VIEW COMPARISON:  None. FINDINGS: Bones: No acute fracture or dislocation. Generalized osteopenia. No periostitis. Joints: Normal alignment. No erosive changes. Severe medial femorotibial compartment joint space narrowing bilaterally. Moderate lateral femorotibial compartment joint space narrowing bilaterally. Severe patellofemoral compartment joint space narrowing bilaterally. Large left knee joint effusion. Moderate right knee joint effusion. Multiple loose bodies in the posterior aspect of the left knee joint space. Soft tissue: No soft tissue abnormality. No radiopaque foreign body. No chondrocalcinosis. IMPRESSION: Advanced tricompartmental osteoarthritis of bilateral knees. Electronically Signed   By: Kathreen Devoid M.D.   On: 09/23/2021 13:17   DG Knee Complete 4 Views Right  Result Date: 09/23/2021 CLINICAL DATA:  Chronic bilateral knee pain. EXAM: LEFT KNEE - COMPLETE 4+ VIEW; RIGHT KNEE - COMPLETE 4+ VIEW COMPARISON:  None. FINDINGS: Bones: No  acute fracture or dislocation. Generalized osteopenia. No periostitis. Joints: Normal alignment. No erosive changes. Severe medial  femorotibial compartment joint space narrowing bilaterally. Moderate lateral femorotibial compartment joint space narrowing bilaterally. Severe patellofemoral compartment joint space narrowing bilaterally. Large left knee joint effusion. Moderate right knee joint effusion. Multiple loose bodies in the posterior aspect of the left knee joint space. Soft tissue: No soft tissue abnormality. No radiopaque foreign body. No chondrocalcinosis. IMPRESSION: Advanced tricompartmental osteoarthritis of bilateral knees. Electronically Signed   By: Kathreen Devoid M.D.   On: 09/23/2021 13:17    Procedures Procedures   Medications Ordered in ED Medications  HYDROcodone-acetaminophen (NORCO/VICODIN) 5-325 MG per tablet 1 tablet (has no administration in time range)  HYDROmorphone (DILAUDID) injection 1 mg (1 mg Intravenous Given 09/23/21 2130)  ketorolac (TORADOL) 15 MG/ML injection 15 mg (15 mg Intravenous Given 09/23/21 2154)  dexamethasone (DECADRON) injection 10 mg (10 mg Intravenous Given 09/23/21 2342)  diphenhydrAMINE (BENADRYL) injection 12.5 mg (12.5 mg Intravenous Given 09/23/21 2343)    ED Course  I have reviewed the triage vital signs and the nursing notes.  Pertinent labs & imaging results that were available during my care of the patient were reviewed by me and considered in my medical decision making (see chart for details).    MDM Rules/Calculators/A&P                          70 year old lady with history of bilateral knee osteoarthritis who reports that she occasionally gets knee injections presents to ER with concern for bilateral knee pain after joint injection in office yesterday.  Per review of chart patient received bilateral hyaluronan intra-articular injections by Bevely Palmer Persons PA/Dr. Durward Fortes.  Patient does have significant tenderness to palpation but does not have significant joint swelling or overlying erythema or fever.  I consulted Dr. Louanne Skye on-call for Ortho care.  He states  that local inflammatory reaction is possible post joint injection.  Had recommended steroid joint injection if possible in ER today or treating symptomatically with NSAIDs, did not recommend arthrocentesis.  Given bilateral nature and sudden onset of pain I have lower suspicion for septic process at present.  Patient continues to have significant pain and reports inability to walk secondary to the pain.  At time of signout, will plan to give dose of systemic steroids and have asked Dr. Leonette Monarch to follow-up on patient.  If patient continues to have intractable pain then anticipate she will need admission for further management and observation.  If pain well controlled then anticipate discharge with plan for outpatient follow-up.  Final Clinical Impression(s) / ED Diagnoses Final diagnoses:  Osteoarthritis, unspecified osteoarthritis type, unspecified site  Acute pain of both knees  Leukocytosis, unspecified type    Rx / DC Orders ED Discharge Orders     None        Lucrezia Starch, MD 09/24/21 0007    Lucrezia Starch, MD 09/24/21 [phone]

## 2021-09-23 NOTE — ED Notes (Signed)
ED Provider at bedside. Pt sitting up in recliner

## 2021-09-23 NOTE — ED Triage Notes (Signed)
Pt here from [facility] via [place] for bilateral knee pain. Pt received injections in both knees yesterday, had worsening pain since last night, non radiating. Pt states it hurts to touch each knee. 119Hr, 98% RA, 150/80, aox4

## 2021-09-23 NOTE — ED Provider Notes (Signed)
Emergency Medicine Provider Triage Evaluation Note  [name] , a 70 y.o. female  was evaluated in triage.  Pt complains of bilateral knee pain 10 out of 10, sharp in nature secondary to injection in both knees last night.  Patient reports that she got injections of medication both knees secondary to arthritis bilaterally.  Patient denies any new numbness or tingling, reports that she has had some chronic numbness of her left foot secondary to a previous cellulitis infection.  Patient reports that she has tried Tylenol, and oxycodone x1 for the pain with minimal relief.  Patient denies fevers or chills at this time. No new fall or injury since injections.  Review of Systems  Positive: Knee pain Negative: Fever, chills, fall  Physical Exam  BP (!) 110/59 (BP Location: Right Arm)    Pulse (!) 111    Temp 99.3 F (37.4 C) (Oral)    Resp (!) 26    SpO2 96%  Gen:   Awake, some distress Resp:  Normal effort  MSK:   Moves extremities without difficulty, intact strength of bil LE with some pain. TTP bilateral knees, there is diffuse swelling of both limbs but no focal redness or abscess Other:  --  Medical Decision Making  Medically screening exam initiated at 12:37 PM.  Appropriate orders placed.  Lacinda Axon Vasconez was informed that the remainder of the evaluation will be completed by another provider, this initial triage assessment does not replace that evaluation, and the importance of remaining in the ED until their evaluation is complete.  Knee pain   Dorien Chihuahua 09/23/21 1238    Carmin Muskrat, MD 09/23/21 1415

## 2021-09-24 MED ORDER — LACTATED RINGERS IV BOLUS
500.0000 mL | Freq: Once | INTRAVENOUS | Status: AC
  Administered 2021-09-24: 01:00:00 500 mL via INTRAVENOUS

## 2021-09-24 NOTE — ED Notes (Signed)
[facility] CALLED FOR TRANSPORT. 

## 2021-09-24 NOTE — ED Notes (Signed)
Pt up for discharge. [facility] requested for transport

## 2021-10-04 DIAGNOSIS — M25569 Pain in unspecified knee: Secondary | ICD-10-CM | POA: Diagnosis not present

## 2021-10-04 DIAGNOSIS — R262 Difficulty in walking, not elsewhere classified: Secondary | ICD-10-CM | POA: Diagnosis not present

## 2021-10-04 DIAGNOSIS — R5381 Other malaise: Secondary | ICD-10-CM | POA: Diagnosis not present

## 2021-10-06 DIAGNOSIS — R5381 Other malaise: Secondary | ICD-10-CM | POA: Diagnosis not present

## 2021-10-06 DIAGNOSIS — M25511 Pain in right shoulder: Secondary | ICD-10-CM | POA: Diagnosis not present

## 2021-10-06 DIAGNOSIS — R600 Localized edema: Secondary | ICD-10-CM | POA: Diagnosis not present

## 2021-10-06 DIAGNOSIS — R5383 Other fatigue: Secondary | ICD-10-CM | POA: Diagnosis not present

## 2021-10-06 DIAGNOSIS — M25512 Pain in left shoulder: Secondary | ICD-10-CM | POA: Diagnosis not present

## 2021-10-10 DIAGNOSIS — R600 Localized edema: Secondary | ICD-10-CM | POA: Diagnosis not present

## 2021-10-10 DIAGNOSIS — M6281 Muscle weakness (generalized): Secondary | ICD-10-CM | POA: Diagnosis not present

## 2021-10-10 DIAGNOSIS — R2689 Other abnormalities of gait and mobility: Secondary | ICD-10-CM | POA: Diagnosis not present

## 2021-10-11 DIAGNOSIS — M25569 Pain in unspecified knee: Secondary | ICD-10-CM | POA: Diagnosis not present

## 2021-10-11 DIAGNOSIS — R262 Difficulty in walking, not elsewhere classified: Secondary | ICD-10-CM | POA: Diagnosis not present

## 2021-10-11 DIAGNOSIS — M170 Bilateral primary osteoarthritis of knee: Secondary | ICD-10-CM | POA: Diagnosis not present

## 2021-10-11 DIAGNOSIS — R600 Localized edema: Secondary | ICD-10-CM | POA: Diagnosis not present

## 2021-10-11 DIAGNOSIS — D649 Anemia, unspecified: Secondary | ICD-10-CM | POA: Diagnosis not present

## 2021-10-11 DIAGNOSIS — R5381 Other malaise: Secondary | ICD-10-CM | POA: Diagnosis not present

## 2021-10-11 DIAGNOSIS — R5383 Other fatigue: Secondary | ICD-10-CM | POA: Diagnosis not present

## 2021-10-11 DIAGNOSIS — M6281 Muscle weakness (generalized): Secondary | ICD-10-CM | POA: Diagnosis not present

## 2021-10-11 DIAGNOSIS — M25561 Pain in right knee: Secondary | ICD-10-CM | POA: Diagnosis not present

## 2021-10-11 DIAGNOSIS — M25562 Pain in left knee: Secondary | ICD-10-CM | POA: Diagnosis not present

## 2021-10-13 DIAGNOSIS — F331 Major depressive disorder, recurrent, moderate: Secondary | ICD-10-CM | POA: Diagnosis not present

## 2021-10-13 DIAGNOSIS — M6281 Muscle weakness (generalized): Secondary | ICD-10-CM | POA: Diagnosis not present

## 2021-10-13 DIAGNOSIS — R5381 Other malaise: Secondary | ICD-10-CM | POA: Diagnosis not present

## 2021-10-13 DIAGNOSIS — M25562 Pain in left knee: Secondary | ICD-10-CM | POA: Diagnosis not present

## 2021-10-13 DIAGNOSIS — R2689 Other abnormalities of gait and mobility: Secondary | ICD-10-CM | POA: Diagnosis not present

## 2021-10-13 DIAGNOSIS — Z8616 Personal history of COVID-19: Secondary | ICD-10-CM | POA: Diagnosis not present

## 2021-10-13 DIAGNOSIS — M170 Bilateral primary osteoarthritis of knee: Secondary | ICD-10-CM | POA: Diagnosis not present

## 2021-10-13 DIAGNOSIS — M25561 Pain in right knee: Secondary | ICD-10-CM | POA: Diagnosis not present

## 2021-10-13 DIAGNOSIS — R600 Localized edema: Secondary | ICD-10-CM | POA: Diagnosis not present

## 2021-10-17 ENCOUNTER — Telehealth: Payer: Self-pay | Admitting: Orthopedic Surgery

## 2021-10-17 NOTE — Telephone Encounter (Signed)
Tried diclofenac 50 mg twice a day for 7 days.  Make sure she is not taking meloxicam.  Thanks

## 2021-10-17 NOTE — Telephone Encounter (Signed)
I called patient. She went to the ED after she had bilateral knee gel injections with 10/10 pain. She states that the pain is not quite as bad as it was then, but she continues to have pain. Right knee pain is worse than left and she does state that she has some swelling. She as moved to [facility] and is using their pharmacy. She is currently taking Oxycodone (not sure of strength) every 8 hours, but this does not help her at all at night. She states that she does not want to sleep as she does her best work at night, but that she needs something to take care of this pain.  Could either of you please advise?

## 2021-10-17 NOTE — Telephone Encounter (Signed)
Pt states gel injection sent her to the hospital. But she is still wanting something for pain, can you call her?   CB [phone]

## 2021-10-18 NOTE — Telephone Encounter (Signed)
I tried to reach patient. She is using the pharmacy at [facility]. I did not send in.

## 2021-10-19 NOTE — Telephone Encounter (Signed)
I tried calling patient. No answer. Will wait for RC to further discuss.

## 2021-10-24 ENCOUNTER — Telehealth: Payer: Self-pay | Admitting: Family Medicine

## 2021-10-24 NOTE — Telephone Encounter (Signed)
Ok

## 2021-10-24 NOTE — Telephone Encounter (Signed)
Deidre Ala, a physical therapist with [facility] called to get verbal orders for PT with a frequency of  one week two   two week four   one week three     Good callback [phone] Okay to leave voicemail    Please advise

## 2021-10-25 ENCOUNTER — Telehealth: Payer: Self-pay | Admitting: Orthopaedic Surgery

## 2021-10-25 NOTE — Telephone Encounter (Signed)
I left Deidre Ala a voicemail with the VO.

## 2021-10-25 NOTE — Telephone Encounter (Signed)
Pt called requesting a call back concerning gel injections and other medical questions. Pt is asking for a call back from [employer]. Pt best phone number is [phone].

## 2021-10-25 NOTE — Telephone Encounter (Signed)
IC appt scheduled.  

## 2021-10-28 ENCOUNTER — Telehealth: Payer: Self-pay | Admitting: Family Medicine

## 2021-10-28 NOTE — Telephone Encounter (Signed)
[name] OT with [facility] is calling  and would like OT 2x2 , 1x5

## 2021-10-28 NOTE — Telephone Encounter (Signed)
Ok

## 2021-10-28 NOTE — Telephone Encounter (Signed)
Last Ov 08/17/21

## 2021-10-31 NOTE — Telephone Encounter (Signed)
I left [name] a voicemail with the VO.

## 2021-11-04 ENCOUNTER — Encounter: Payer: Self-pay | Admitting: Orthopedic Surgery

## 2021-11-04 ENCOUNTER — Ambulatory Visit: Payer: Medicare PPO | Admitting: Orthopedic Surgery

## 2021-11-04 ENCOUNTER — Other Ambulatory Visit: Payer: Self-pay

## 2021-11-04 VITALS — Ht 61.0 in | Wt 188.0 lb

## 2021-11-04 DIAGNOSIS — M1712 Unilateral primary osteoarthritis, left knee: Secondary | ICD-10-CM | POA: Diagnosis not present

## 2021-11-04 DIAGNOSIS — M170 Bilateral primary osteoarthritis of knee: Secondary | ICD-10-CM | POA: Diagnosis not present

## 2021-11-04 DIAGNOSIS — M1711 Unilateral primary osteoarthritis, right knee: Secondary | ICD-10-CM | POA: Diagnosis not present

## 2021-11-04 NOTE — Progress Notes (Signed)
Office Visit Note   Patient: [name]           Date of Birth: 11/18/1950           MRN: [id number] Visit Date: 11/04/2021 Requested by: [name], MD [place],  [place] [address] PCP: [name], MD  Subjective: Chief Complaint  Patient presents with   Other     Knee pain-discuss surgery    HPI: [name] is a 71 year old patient with bilateral knee pain right worse than left.  She currently is in an assisted living type facility in [facility].  She can walk about 6 to 13 feet.  Taking oxycodone daily but transitioning to tramadol.  She is in this assisted living facility month-to-month.  Her pain is severe  She is ready to proceed with total knee replacement.  She is here with an advocate.              ROS: All systems reviewed are negative as they relate to the chief complaint within the history of present illness.  Patient denies  fevers or chills.   Assessment & Plan: Visit Diagnoses:  1. Unilateral primary osteoarthritis, left knee   2. Unilateral primary osteoarthritis, right knee     Plan: Impression is right knee arthritis.  She has about a 20 degree flexion contracture.  Knee replacement should improve her mobility but she is somewhat deconditioned at this time.  The very rigorous nature of the rehabilitative process after knee replacement is discussed.  The risk and benefits were also discussed including not limited to infection nerve vessel damage knee stiffness incomplete pain relief as well as potential for revision.  Plan at this time is to work on quad strengthening for the next month.  Optimizing nutritional status will also be important.  Plan for surgery sometime before the month-to-month lease ends in March.  Anticipate skilled nursing facility stay likely for 3 weeks.  I do think that getting both quad muscles stronger prior to surgery would be beneficial.  We did discuss how her knee will hurt more before it hurts less after  surgery particularly the first several weeks.  All questions answered.  Her BMI is 35 today.  Follow-up 2 weeks after surgery.  Follow-Up Instructions: No follow-ups on file.   Orders:  No orders of the defined types were placed in this encounter.  No orders of the defined types were placed in this encounter.     Procedures: No procedures performed   Clinical Data: No additional findings.  Objective: Vital Signs: Ht 5\' 1"  (1.549 m)    Wt 188 lb (85.3 kg)    BMI 35.52 kg/m   Physical Exam:   Constitutional: Patient appears well-developed HEENT:  Head: Normocephalic Eyes:EOM are normal Neck: Normal range of motion Cardiovascular: Normal rate Pulmonary/chest: Effort normal Neurologic: Patient is alert Skin: Skin is warm Psychiatric: Patient has normal mood and affect   Ortho Exam: Ortho exam demonstrates palpable pedal pulses.  She has flexion to about 100 degrees but a 20 degree flexion contracture on the right-hand side.  Collaterals are stable.  Extensor mechanism is intact.  No groin pain with internal/external rotation of the leg.  Skin is intact on the right-hand side.  Ankle dorsiflexion intact.  Specialty Comments:  No specialty comments available.  Imaging: No results found.   PMFS History: Patient Active Problem List   Diagnosis Date Noted   Pressure injury of skin 08/27/2021   Fall 08/23/2021  Difficulty in walking 08/23/2021   Bilateral lower extremity edema 08/23/2021   Hormone replacement therapy 08/23/2021   Osteoporosis 01/28/2021   Osteoarthritis of right wrist 01/28/2021   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   Foot pain 09/13/2020   Knee pain 08/24/2018   Fall in home 07/14/2015   TIBIALIS TENDINITIS 08/25/2009   CHEST PAIN, OTHER, PAIN 10/21/2008   PEDAL EDEMA 07/22/2008   ANKLE PAIN, BILATERAL 04/01/2008   LOW BACK PAIN 04/01/2008   ACUTE BRONCHITIS 09/23/2007   SARCOIDOSIS 09/02/2007   Asthma 09/02/2007   Diverticulosis of colon  07/11/2007   BACK PAIN, CHRONIC 07/11/2007   Past Medical History:  Diagnosis Date   Acute bronchitis    Allergy    Anxiety    Arthritis    knees   Asthma    Backache, unspecified    Cataract    removed both eyes   Depression    Diverticulitis    Diverticulosis of colon (without mention of hemorrhage)    Edema    Glaucoma    Lumbago    Obesity    Other chest pain    Pain in joint, ankle and foot    Sarcoidosis    Tibialis tendinitis    Unspecified asthma(493.90)     Family History  Problem Relation Age of Onset   Hypertension Mother    Stroke Mother    Diabetes Father    Sarcoidosis Sister    Colon cancer Neg Hx    Colon polyps Neg Hx    Esophageal cancer Neg Hx    Rectal cancer Neg Hx    Stomach cancer Neg Hx     Past Surgical History:  Procedure Laterality Date   ABDOMINAL HYSTERECTOMY     CATARACT EXTRACTION W/PHACO Right 06/24/2014   Procedure: CATARACT EXTRACTION PHACO AND INTRAOCULAR LENS PLACEMENT ([place]) RIGHT EYE WITH GONIOSYNECHIALYSIS;  Surgeon: Marylynn Pearson, MD;  Location: [facility];  Service: Ophthalmology;  Laterality: Right;   COLONOSCOPY     EYE SURGERY Left    cataract surgery   HAND SURGERY     IR RADIOLOGIST EVAL & MGMT  11/14/2018   KNEE ARTHROSCOPY Right    Social History   Occupational History   Not on file  Tobacco Use   Smoking status: Never   Smokeless tobacco: Never  Substance and Sexual Activity   Alcohol use: Yes    Alcohol/week: 1.0 standard drink    Types: 1 Standard drinks or equivalent per week    Comment: social   Drug use: No   Sexual activity: Not on file

## 2021-11-10 ENCOUNTER — Telehealth: Payer: Self-pay | Admitting: Family Medicine

## 2021-11-10 NOTE — Telephone Encounter (Signed)
[name] an occupational therapist with [facility] called to get verbal orders to continue treating patient and increasing frequency to two times a week for three weeks      Good callback number is [phone] Faythe Ghee to leave voicemails      Please advise

## 2021-11-11 NOTE — Telephone Encounter (Signed)
Ok

## 2021-11-14 NOTE — Telephone Encounter (Signed)
Verbal orders to continue treating patient and increasing frequency to two times a week for three weeks given to [facility]; verb understanding.

## 2021-11-15 ENCOUNTER — Telehealth: Payer: Self-pay | Admitting: Family Medicine

## 2021-11-15 NOTE — Telephone Encounter (Signed)
Alexis from [facility] called because they wanted to know if the signature from [facility] on the orders for patient were electronic or stamped.     Good callback number is [phone]      Please advise

## 2021-11-17 NOTE — Telephone Encounter (Signed)
[name] is calling concerning the below message

## 2021-11-22 ENCOUNTER — Telehealth: Payer: Self-pay | Admitting: Orthopedic Surgery

## 2021-11-22 NOTE — Telephone Encounter (Signed)
Kim with [facility] calling regarding a script from Dr. Marlou Sa.  She has a question involving occupational therapy & physical therapy.  She mentions in particular "working on quad strengthening"  Please call her at [phone]

## 2021-11-23 NOTE — Telephone Encounter (Signed)
[name] is calling and would like to know if signature on the orders was electronic or stamped

## 2021-11-23 NOTE — Telephone Encounter (Signed)
[name] has refaxed the orders and per [name] its ok for dr banks to resigned the orders. [facility] can not take stamped signatures. The orders are in [name] folder

## 2021-11-28 NOTE — Telephone Encounter (Signed)
Ive tried calling to discuss. No answer.

## 2021-12-05 ENCOUNTER — Other Ambulatory Visit: Payer: Self-pay

## 2021-12-05 NOTE — Progress Notes (Signed)
Surgical Instructions ? ? ? Your procedure is scheduled on Thursday, March 9th, 2023. ? ? Report to [facility] Main Entrance "A" at 05:30 A.M., then check in with the Admitting office. ? Call this number if you have problems the morning of surgery: ? [phone] ? ? If you have any questions prior to your surgery date call [phone]: Open Monday-Friday 8am-4pm ? ? ? Remember: ? Do not eat after midnight the night before your surgery ? ?You may drink clear liquids until 04:30 the morning of your surgery.   ?Clear liquids allowed are: Water, Non-Citrus Juices (without pulp), Carbonated Beverages, Clear Tea, Black Coffee ONLY (NO MILK, CREAM OR POWDERED CREAMER of any kind), and Gatorade ? ?Patient Instructions ? ?The night before surgery:  ?No food after midnight. ONLY clear liquids after midnight ? ?The day of surgery (if you do NOT have diabetes):  ?Drink ONE (1) Pre-Surgery Clear Ensure by 04:30 the morning of surgery. Drink in one sitting. Do not sip.  ?This drink was given to you during your hospital  ?pre-op appointment visit. ? ?Nothing else to drink after completing the  ?Pre-Surgery Clear Ensure. ? ?       If you have questions, please contact your surgeon?s office.  ?  ? Take these medicines the morning of surgery with A SIP OF WATER:  ? ?OXYCODONE  ?fluticasone (FLONASE)  ?cycloSPORINE (RESTASIS)  ?[place]  ? ?If needed: ? ?albuterol (VENTOLIN HFA) ? ?Please bring all inhalers with you the day of surgery.   ? ?As of today, STOP taking any Aspirin (unless otherwise instructed by your surgeon) Aleve, Naproxen, Ibuprofen, Motrin, Advil, Goody's, BC's, all herbal medications, fish oil, and all vitamins. ? ? ?The day of surgery: ?         ?Do not wear jewelry or makeup ?Do not wear lotions, powders, perfumes, or deodorant. ?Do not shave 48 hours prior to surgery. ?Do not bring valuables to the hospital. ?Do not wear nail polish, gel polish, artificial nails, or any other type of covering on natural nails  (fingers and toes) ?If you have artificial nails or gel coating that need to be removed by a nail salon, please have this removed prior to surgery. Artificial nails or gel coating may interfere with anesthesia's ability to adequately monitor your vital signs. ? ? ?[facility] is not responsible for any belongings or valuables. .  ? ?Do NOT Smoke (Tobacco/Vaping)  24 hours prior to your procedure ? ?If you use a CPAP at night, you may bring your mask for your overnight stay. ?  ?Contacts, glasses, hearing aids, dentures or partials may not be worn into surgery, please bring cases for these belongings ?  ?For patients admitted to the hospital, discharge time will be determined by your treatment team. ?  ?Patients discharged the day of surgery will not be allowed to drive home, and someone needs to stay with them for 24 hours. ? ?NO VISITORS WILL BE ALLOWED IN PRE-OP WHERE PATIENTS ARE PREPPED FOR SURGERY.  ONLY 1 SUPPORT PERSON MAY BE PRESENT IN THE WAITING ROOM WHILE YOU ARE IN SURGERY.  IF YOU ARE TO BE ADMITTED, ONCE YOU ARE IN YOUR ROOM YOU WILL BE ALLOWED TWO (2) VISITORS. 1 (ONE) VISITOR MAY STAY OVERNIGHT BUT MUST ARRIVE TO THE ROOM BY 8pm.  Minor children may have two parents present. Special consideration for safety and communication needs will be reviewed on a case by case basis. ? ?Special instructions:   ? ?Oral Hygiene is  also important to reduce your risk of infection.  Remember - BRUSH YOUR TEETH THE MORNING OF SURGERY WITH YOUR REGULAR TOOTHPASTE ? ? ?[facility]- Preparing For Surgery ? ?Before surgery, you can play an important role. Because skin is not sterile, your skin needs to be as free of germs as possible. You can reduce the number of germs on your skin by washing with CHG (chlorahexidine gluconate) Soap before surgery.  CHG is an antiseptic cleaner which kills germs and bonds with the skin to continue killing germs even after washing.   ? ? ?Please do not use if you have an allergy to CHG or  antibacterial soaps. If your skin becomes reddened/irritated stop using the CHG.  ?Do not shave (including legs and underarms) for at least 48 hours prior to first CHG shower. It is OK to shave your face. ? ?Please follow these instructions carefully. ?  ? ? Shower the NIGHT BEFORE SURGERY and the MORNING OF SURGERY with CHG Soap.  ? If you chose to wash your hair, wash your hair first as usual with your normal shampoo. After you shampoo, rinse your hair and body thoroughly to remove the shampoo.  Then [employer] and genitals (private parts) with your normal soap and rinse thoroughly to remove soap. ? ?After that Use CHG Soap as you would any other liquid soap. You can apply CHG directly to the skin and wash gently with a scrungie or a clean washcloth.  ? ?Apply the CHG Soap to your body ONLY FROM THE NECK DOWN.  Do not use on open wounds or open sores. Avoid contact with your eyes, ears, mouth and genitals (private parts). Wash Face and genitals (private parts)  with your normal soap.  ? ?Wash thoroughly, paying special attention to the area where your surgery will be performed. ? ?Thoroughly rinse your body with warm water from the neck down. ? ?DO NOT shower/wash with your normal soap after using and rinsing off the CHG Soap. ? ?Pat yourself dry with a CLEAN TOWEL. ? ?Wear CLEAN PAJAMAS to bed the night before surgery ? ?Place CLEAN SHEETS on your bed the night before your surgery ? ?DO NOT SLEEP WITH PETS. ? ? ?Day of Surgery: ? ?Take a shower with CHG soap. ?Wear Clean/Comfortable clothing the morning of surgery ?Do not apply any deodorants/lotions.   ?Remember to brush your teeth WITH YOUR REGULAR TOOTHPASTE. ? ? ? ?COVID testing ? ?If you are going to stay overnight or be admitted after your procedure/surgery and require a pre-op COVID test, please follow these instructions after your COVID test  ? ?You are not required to quarantine however you are required to wear a well-fitting mask when you are out and  around people not in your household.  If your mask becomes wet or soiled, replace with a new one. ? ?Wash your hands often with soap and water for 20 seconds or clean your hands with an alcohol-based hand sanitizer that contains at least 60% alcohol. ? ?Do not share personal items. ? ?Notify your provider: ?if you are in close contact with someone who has COVID  ?or if you develop a fever of 100.4 or greater, sneezing, cough, sore throat, shortness of breath or body aches. ? ?  ?Please read over the following fact sheets that you were given.   ?

## 2021-12-06 ENCOUNTER — Other Ambulatory Visit: Payer: Self-pay

## 2021-12-06 ENCOUNTER — Encounter (HOSPITAL_COMMUNITY)
Admission: RE | Admit: 2021-12-06 | Discharge: 2021-12-06 | Disposition: A | Discharge status: Home / Self Care | Payer: Medicare PPO | Source: Ambulatory Visit | Attending: Orthopedic Surgery | Admitting: Orthopedic Surgery

## 2021-12-06 ENCOUNTER — Encounter (HOSPITAL_COMMUNITY): Payer: Self-pay

## 2021-12-06 VITALS — BP 141/80 | HR 111 | Temp 98.3°F | Resp 17 | Ht 61.0 in | Wt 189.0 lb

## 2021-12-06 DIAGNOSIS — R531 Weakness: Secondary | ICD-10-CM | POA: Diagnosis not present

## 2021-12-06 DIAGNOSIS — Z20822 Contact with and (suspected) exposure to covid-19: Secondary | ICD-10-CM | POA: Insufficient documentation

## 2021-12-06 DIAGNOSIS — K5900 Constipation, unspecified: Secondary | ICD-10-CM | POA: Diagnosis present

## 2021-12-06 DIAGNOSIS — Z01818 Encounter for other preprocedural examination: Secondary | ICD-10-CM

## 2021-12-06 DIAGNOSIS — M1711 Unilateral primary osteoarthritis, right knee: Secondary | ICD-10-CM | POA: Diagnosis not present

## 2021-12-06 DIAGNOSIS — Z01812 Encounter for preprocedural laboratory examination: Secondary | ICD-10-CM | POA: Insufficient documentation

## 2021-12-06 DIAGNOSIS — E669 Obesity, unspecified: Secondary | ICD-10-CM | POA: Diagnosis present

## 2021-12-06 DIAGNOSIS — M6250 Muscle wasting and atrophy, not elsewhere classified, unspecified site: Secondary | ICD-10-CM | POA: Diagnosis not present

## 2021-12-06 DIAGNOSIS — M170 Bilateral primary osteoarthritis of knee: Secondary | ICD-10-CM | POA: Diagnosis not present

## 2021-12-06 DIAGNOSIS — Z741 Need for assistance with personal care: Secondary | ICD-10-CM | POA: Diagnosis not present

## 2021-12-06 DIAGNOSIS — R609 Edema, unspecified: Secondary | ICD-10-CM | POA: Diagnosis not present

## 2021-12-06 DIAGNOSIS — G8918 Other acute postprocedural pain: Secondary | ICD-10-CM | POA: Diagnosis not present

## 2021-12-06 DIAGNOSIS — Z886 Allergy status to analgesic agent status: Secondary | ICD-10-CM | POA: Diagnosis not present

## 2021-12-06 DIAGNOSIS — M19031 Primary osteoarthritis, right wrist: Secondary | ICD-10-CM | POA: Diagnosis present

## 2021-12-06 DIAGNOSIS — Z96651 Presence of right artificial knee joint: Secondary | ICD-10-CM | POA: Diagnosis not present

## 2021-12-06 DIAGNOSIS — Z471 Aftercare following joint replacement surgery: Secondary | ICD-10-CM | POA: Diagnosis not present

## 2021-12-06 DIAGNOSIS — T8484XD Pain due to internal orthopedic prosthetic devices, implants and grafts, subsequent encounter: Secondary | ICD-10-CM | POA: Diagnosis not present

## 2021-12-06 DIAGNOSIS — M6281 Muscle weakness (generalized): Secondary | ICD-10-CM | POA: Diagnosis not present

## 2021-12-06 DIAGNOSIS — K219 Gastro-esophageal reflux disease without esophagitis: Secondary | ICD-10-CM | POA: Diagnosis present

## 2021-12-06 DIAGNOSIS — J45909 Unspecified asthma, uncomplicated: Secondary | ICD-10-CM | POA: Diagnosis present

## 2021-12-06 DIAGNOSIS — M21261 Flexion deformity, right knee: Secondary | ICD-10-CM | POA: Diagnosis present

## 2021-12-06 DIAGNOSIS — R262 Difficulty in walking, not elsewhere classified: Secondary | ICD-10-CM | POA: Diagnosis not present

## 2021-12-06 DIAGNOSIS — D649 Anemia, unspecified: Secondary | ICD-10-CM | POA: Diagnosis present

## 2021-12-06 DIAGNOSIS — M810 Age-related osteoporosis without current pathological fracture: Secondary | ICD-10-CM | POA: Diagnosis present

## 2021-12-06 DIAGNOSIS — Z8249 Family history of ischemic heart disease and other diseases of the circulatory system: Secondary | ICD-10-CM | POA: Diagnosis not present

## 2021-12-06 DIAGNOSIS — Z882 Allergy status to sulfonamides status: Secondary | ICD-10-CM | POA: Diagnosis not present

## 2021-12-06 DIAGNOSIS — Z833 Family history of diabetes mellitus: Secondary | ICD-10-CM | POA: Diagnosis not present

## 2021-12-06 DIAGNOSIS — Z7401 Bed confinement status: Secondary | ICD-10-CM | POA: Diagnosis not present

## 2021-12-06 DIAGNOSIS — Z823 Family history of stroke: Secondary | ICD-10-CM | POA: Diagnosis not present

## 2021-12-06 DIAGNOSIS — Z6836 Body mass index (BMI) 36.0-36.9, adult: Secondary | ICD-10-CM | POA: Diagnosis not present

## 2021-12-06 LAB — SURGICAL PCR SCREEN
MRSA, PCR: NEGATIVE
Staphylococcus aureus: NEGATIVE

## 2021-12-06 LAB — CBC
HCT: 31.8 % — ABNORMAL LOW (ref 36.0–46.0)
Hemoglobin: 9.6 g/dL — ABNORMAL LOW (ref 12.0–15.0)
MCH: 26.4 pg (ref 26.0–34.0)
MCHC: 30.2 g/dL (ref 30.0–36.0)
MCV: 87.4 fL (ref 80.0–100.0)
Platelets: 665 10*3/uL — ABNORMAL HIGH (ref 150–400)
RBC: 3.64 MIL/uL — ABNORMAL LOW (ref 3.87–5.11)
RDW: 18.6 % — ABNORMAL HIGH (ref 11.5–15.5)
WBC: 13 10*3/uL — ABNORMAL HIGH (ref 4.0–10.5)
nRBC: 0 % (ref 0.0–0.2)

## 2021-12-06 LAB — BASIC METABOLIC PANEL
Anion gap: 12 (ref 5–15)
BUN: 8 mg/dL (ref 8–23)
CO2: 24 mmol/L (ref 22–32)
Calcium: 9 mg/dL (ref 8.9–10.3)
Chloride: 100 mmol/L (ref 98–111)
Creatinine, Ser: 0.46 mg/dL (ref 0.44–1.00)
GFR, Estimated: >60 mL/min (ref >60)
Glucose, Bld: 117 mg/dL — ABNORMAL HIGH (ref 70–99)
Potassium: 4 mmol/L (ref 3.5–5.1)
Sodium: 136 mmol/L (ref 135–145)

## 2021-12-06 LAB — SARS CORONAVIRUS 2 (TAT 6-24 HRS): SARS Coronavirus 2: NEGATIVE

## 2021-12-06 NOTE — Progress Notes (Signed)
Unable to obtain UA and UC at PAT. Pt stated she is 2+ assistance and would be unable to use the bathroom to provide a sample. Dr. Randel Pigg surgery scheduler notified. LVM for her to return call to see if we can discontinue order or if it needs to be obtained DOS ?

## 2021-12-06 NOTE — Progress Notes (Signed)
Surgical Instructions ? ? ? Your procedure is scheduled on Thursday, March 9th, 2023. ? ? Report to [facility] Main Entrance "A" at 05:30 A.M., then check in with the Admitting office. ? Call this number if you have problems the morning of surgery: ? [phone] ? ? If you have any questions prior to your surgery date call [phone]: Open Monday-Friday 8am-4pm ? ? ? Remember: ? Do not eat after midnight the night before your surgery ? ?You may drink clear liquids until 04:30 the morning of your surgery.   ?Clear liquids allowed are: Water, Non-Citrus Juices (without pulp), Carbonated Beverages, Clear Tea, Black Coffee ONLY (NO MILK, CREAM OR POWDERED CREAMER of any kind), and Gatorade ? ?Patient Instructions ? ?The night before surgery:  ?No food after midnight. ONLY clear liquids after midnight ? ?The day of surgery (if you do NOT have diabetes):  ?Drink ONE (1) Pre-Surgery Clear Ensure by 04:30 the morning of surgery. Drink in one sitting. Do not sip.  ?This drink was given to you during your hospital  ?pre-op appointment visit. ? ?Nothing else to drink after completing the  ?Pre-Surgery Clear Ensure. ? ?       If you have questions, please contact your surgeon?s office.  ?  ? Take these medicines the morning of surgery with A SIP OF WATER:  ?fluticasone (FLONASE)  ?cycloSPORINE (RESTASIS) eye drops ?[place]  ?DULoxetine (CYMBALTA) ?estradiol (ESTRACE) ?omeprazole (PRILOSEC) ? ? ?If needed: ?acetaminophen (TYLENOL) ?albuterol (VENTOLIN HFA)-Please bring all inhalers with you the day of surgery if needed   ?cyclobenzaprine (FLEXERIL) ?fluticasone (FLONASE)  ?ondansetron (ZOFRAN-ODT) ?OXYCODONE  ?traMADol Veatrice [place]) ? ? ? ?As of today, STOP taking any Aspirin (unless otherwise instructed by your surgeon) Aleve, Naproxen, Ibuprofen, Motrin, Advil, Goody's, BC's, all herbal medications, fish oil, and all vitamins. ? ? ?The day of surgery: ?         ?Do not wear jewelry or makeup ?Do not wear lotions, powders,  perfumes, or deodorant. ?Do not shave 48 hours prior to surgery. ?Do not bring valuables to the hospital. ?Do not wear nail polish, gel polish, artificial nails, or any other type of covering on natural nails (fingers and toes) ?If you have artificial nails or gel coating that need to be removed by a nail salon, please have this removed prior to surgery. Artificial nails or gel coating may interfere with anesthesia's ability to adequately monitor your vital signs. ? ? ?[facility] is not responsible for any belongings or valuables. .  ? ?Do NOT Smoke (Tobacco/Vaping)  24 hours prior to your procedure ? ?If you use a CPAP at night, you may bring your mask for your overnight stay. ?  ?Contacts, glasses, hearing aids, dentures or partials may not be worn into surgery, please bring cases for these belongings ?  ?For patients admitted to the hospital, discharge time will be determined by your treatment team. ?  ?Patients discharged the day of surgery will not be allowed to drive home, and someone needs to stay with them for 24 hours. ? ?NO VISITORS WILL BE ALLOWED IN PRE-OP WHERE PATIENTS ARE PREPPED FOR SURGERY.  ONLY 1 SUPPORT PERSON MAY BE PRESENT IN THE WAITING ROOM WHILE YOU ARE IN SURGERY.  IF YOU ARE TO BE ADMITTED, ONCE YOU ARE IN YOUR ROOM YOU WILL BE ALLOWED TWO (2) VISITORS. 1 (ONE) VISITOR MAY STAY OVERNIGHT BUT MUST ARRIVE TO THE ROOM BY 8pm.  Minor children may have two parents present. Special consideration for safety and communication  needs will be reviewed on a case by case basis. ? ?Special instructions:   ? ?Oral Hygiene is also important to reduce your risk of infection.  Remember - BRUSH YOUR TEETH THE MORNING OF SURGERY WITH YOUR REGULAR TOOTHPASTE ? ? ?[facility]- Preparing For Surgery ? ?Before surgery, you can play an important role. Because skin is not sterile, your skin needs to be as free of germs as possible. You can reduce the number of germs on your skin by washing with CHG (chlorahexidine  gluconate) Soap before surgery.  CHG is an antiseptic cleaner which kills germs and bonds with the skin to continue killing germs even after washing.   ? ? ?Please do not use if you have an allergy to CHG or antibacterial soaps. If your skin becomes reddened/irritated stop using the CHG.  ?Do not shave (including legs and underarms) for at least 48 hours prior to first CHG shower. It is OK to shave your face. ? ?Please follow these instructions carefully. ?  ? ? Shower the NIGHT BEFORE SURGERY and the MORNING OF SURGERY with CHG Soap.  ? If you chose to wash your hair, wash your hair first as usual with your normal shampoo. After you shampoo, rinse your hair and body thoroughly to remove the shampoo.  Then [employer] and genitals (private parts) with your normal soap and rinse thoroughly to remove soap. ? ?After that Use CHG Soap as you would any other liquid soap. You can apply CHG directly to the skin and wash gently with a scrungie or a clean washcloth.  ? ?Apply the CHG Soap to your body ONLY FROM THE NECK DOWN.  Do not use on open wounds or open sores. Avoid contact with your eyes, ears, mouth and genitals (private parts). Wash Face and genitals (private parts)  with your normal soap.  ? ?Wash thoroughly, paying special attention to the area where your surgery will be performed. ? ?Thoroughly rinse your body with warm water from the neck down. ? ?DO NOT shower/wash with your normal soap after using and rinsing off the CHG Soap. ? ?Pat yourself dry with a CLEAN TOWEL. ? ?Wear CLEAN PAJAMAS to bed the night before surgery ? ?Place CLEAN SHEETS on your bed the night before your surgery ? ?DO NOT SLEEP WITH PETS. ? ? ?Day of Surgery: ? ?Take a shower with CHG soap. ?Wear Clean/Comfortable clothing the morning of surgery ?Do not apply any deodorants/lotions.   ?Remember to brush your teeth WITH YOUR REGULAR TOOTHPASTE. ? ? ? ?COVID testing ? ?If you are going to stay overnight or be admitted after your  procedure/surgery and require a pre-op COVID test, please follow these instructions after your COVID test  ? ?You are not required to quarantine however you are required to wear a well-fitting mask when you are out and around people not in your household.  If your mask becomes wet or soiled, replace with a new one. ? ?Wash your hands often with soap and water for 20 seconds or clean your hands with an alcohol-based hand sanitizer that contains at least 60% alcohol. ? ?Do not share personal items. ? ?Notify your provider: ?if you are in close contact with someone who has COVID  ?or if you develop a fever of 100.4 or greater, sneezing, cough, sore throat, shortness of breath or body aches. ? ?  ?Please read over the following fact sheets that you were given.   ?

## 2021-12-06 NOTE — Progress Notes (Signed)
PCP - Dr. Grier Mitts ?Cardiologist - denies ? ?PPM/ICD - denies ? ? ?Chest x-ray - 08/23/21 ?EKG - 09/23/21 ?Stress Test - denies ?ECHO - 08/24/21 ?Cardiac Cath - denies ? ?Sleep Study - denies ? ? ?DM- denies ? ?ASA/Blood Thinner Instructions: n/a ? ? ?ERAS Protcol - yes ?PRE-SURGERY Ensure given at PAT ? ?COVID TEST- 12/06/21 at PAT ? ? ?Anesthesia review: no ? ?Patient denies shortness of breath, fever, cough and chest pain at PAT appointment ? ? ?All instructions explained to the patient, with a verbal understanding of the material. Patient agrees to go over the instructions while at home for a better understanding. Patient also instructed to wear a mask in public after being tested for COVID-19. The opportunity to ask questions was provided. ?  ?

## 2021-12-07 ENCOUNTER — Encounter (HOSPITAL_COMMUNITY): Payer: Self-pay | Admitting: Orthopedic Surgery

## 2021-12-07 ENCOUNTER — Telehealth: Payer: Self-pay | Admitting: *Deleted

## 2021-12-07 MED ORDER — TRANEXAMIC ACID 1000 MG/10ML IV SOLN
2000.0000 mg | INTRAVENOUS | Status: DC
  Filled 2021-12-07: qty 20

## 2021-12-07 NOTE — Telephone Encounter (Signed)
Ortho bundle Pre-op call completed. 

## 2021-12-07 NOTE — Care Plan (Signed)
[facility] RNCM call to patient to discuss her upcoming Right total knee arthroplasty with Dr. Marlou Sa. She is an Ortho bundle through [facility] and is agreeable to case management. She states she has been living recently in an Assisted living residence at [facility] for the last few weeks since she has been unable to get around on her own. She lives alone normally and states she has no one that can assist her. Will need SNF for STR after discharge. She named [facility] specifically that she is considering. Reviewed post op care instructions. Also she noted her CPM that she received prior to surgery from [facility] is not working at this time. She is currently active with HHPT- [facility]. Will continue to follow for needs. ?

## 2021-12-07 NOTE — Anesthesia Preprocedure Evaluation (Addendum)
Anesthesia Evaluation  ?Patient identified by MRN, date of birth, ID band ?Patient awake ? ? ? ?Reviewed: ?Allergy & Precautions, NPO status , Patient's Chart, lab work & pertinent test results ? ?Airway ?Mallampati: I ? ?TM Distance: >3 FB ?Neck ROM: Full ? ? ? Dental ?no notable dental hx. ?(+) Dental Advisory Given, Missing,  ?  ?Pulmonary ?asthma ,  ?  ?Pulmonary exam normal ?breath sounds clear to auscultation ? ? ? ? ? ? Cardiovascular ?negative cardio ROS ?Normal cardiovascular exam ?Rhythm:Regular Rate:Normal ? ?TTE 2022 ?1. Left ventricular ejection fraction, by estimation, is 70 to 75%. The  ?left ventricle has hyperdynamic function. The left ventricle has no  ?regional wall motion abnormalities. There is mild left ventricular  ?hypertrophy. Left ventricular diastolic  ?parameters were normal.  ??2. Right ventricular systolic function is normal. The right ventricular  ?size is normal. Tricuspid regurgitation signal is inadequate for assessing  ?PA pressure.  ??3. The mitral valve is normal in structure. No evidence of mitral valve  ?regurgitation.  ??4. The aortic valve was not well visualized. Aortic valve regurgitation  ?is not visualized. No aortic stenosis is present.  ??5. The inferior vena cava is normal in size with <50% respiratory  ?variability, suggesting right atrial pressure of 8 mmHg.  ?  ?Neuro/Psych ?PSYCHIATRIC DISORDERS Anxiety Depression negative neurological ROS ?   ? GI/Hepatic ?Neg liver ROS, GERD  ,  ?Endo/Other  ?negative endocrine ROS ? Renal/GU ?negative Renal ROS  ?negative genitourinary ?  ?Musculoskeletal ? ?(+) Arthritis ,  ? Abdominal ?  ?Peds ? Hematology ?negative hematology ROS ?(+)   ?Anesthesia Other Findings ? ? Reproductive/Obstetrics ? ?  ? ? ? ? ? ? ? ? ? ? ? ? ? ?  ?  ? ? ? ? ? ? ? ?Anesthesia Physical ?Anesthesia Plan ? ?ASA: 2 ? ?Anesthesia Plan: Spinal and Regional  ? ?Post-op Pain Management: Tylenol PO (pre-op)* and Regional  block*  ? ?Induction:  ? ?PONV Risk Score and Plan: 2 and Treatment may vary due to age or medical condition, Ondansetron, Dexamethasone and Propofol infusion ? ?Airway Management Planned: Natural Airway ? ?Additional Equipment:  ? ?Intra-op Plan:  ? ?Post-operative Plan:  ? ?Informed Consent: I have reviewed the patients History and Physical, chart, labs and discussed the procedure including the risks, benefits and alternatives for the proposed anesthesia with the patient or authorized representative who has indicated his/her understanding and acceptance.  ? ? ? ?Dental advisory given ? ?Plan Discussed with: CRNA ? ?Anesthesia Plan Comments:   ? ? ? ? ? ?Anesthesia Quick Evaluation ? ?

## 2021-12-08 ENCOUNTER — Encounter (HOSPITAL_COMMUNITY): Payer: Self-pay | Admitting: Orthopedic Surgery

## 2021-12-08 ENCOUNTER — Inpatient Hospital Stay (HOSPITAL_COMMUNITY)
Admission: AD | Admit: 2021-12-08 | Discharge: 2021-12-14 | DRG: 470 | Disposition: A | Discharge status: Skilled Nursing Facility | Payer: Medicare PPO | Attending: Orthopedic Surgery | Admitting: Orthopedic Surgery

## 2021-12-08 ENCOUNTER — Encounter (HOSPITAL_COMMUNITY)
Admission: AD | Disposition: A | Discharge status: Skilled Nursing Facility | Payer: Self-pay | Source: Home / Self Care | Attending: Orthopedic Surgery

## 2021-12-08 ENCOUNTER — Other Ambulatory Visit: Payer: Self-pay

## 2021-12-08 ENCOUNTER — Ambulatory Visit (HOSPITAL_COMMUNITY): Payer: Medicare PPO | Admitting: Physician Assistant

## 2021-12-08 ENCOUNTER — Ambulatory Visit (HOSPITAL_COMMUNITY): Payer: Medicare PPO | Admitting: Certified Registered"

## 2021-12-08 DIAGNOSIS — R609 Edema, unspecified: Secondary | ICD-10-CM | POA: Diagnosis not present

## 2021-12-08 DIAGNOSIS — K219 Gastro-esophageal reflux disease without esophagitis: Secondary | ICD-10-CM | POA: Diagnosis present

## 2021-12-08 DIAGNOSIS — Z823 Family history of stroke: Secondary | ICD-10-CM | POA: Diagnosis not present

## 2021-12-08 DIAGNOSIS — M810 Age-related osteoporosis without current pathological fracture: Secondary | ICD-10-CM | POA: Diagnosis present

## 2021-12-08 DIAGNOSIS — M1711 Unilateral primary osteoarthritis, right knee: Secondary | ICD-10-CM

## 2021-12-08 DIAGNOSIS — Z8249 Family history of ischemic heart disease and other diseases of the circulatory system: Secondary | ICD-10-CM

## 2021-12-08 DIAGNOSIS — Z96651 Presence of right artificial knee joint: Secondary | ICD-10-CM

## 2021-12-08 DIAGNOSIS — G8918 Other acute postprocedural pain: Secondary | ICD-10-CM | POA: Diagnosis not present

## 2021-12-08 DIAGNOSIS — E669 Obesity, unspecified: Secondary | ICD-10-CM | POA: Diagnosis present

## 2021-12-08 DIAGNOSIS — M19031 Primary osteoarthritis, right wrist: Secondary | ICD-10-CM | POA: Diagnosis present

## 2021-12-08 DIAGNOSIS — Z833 Family history of diabetes mellitus: Secondary | ICD-10-CM

## 2021-12-08 DIAGNOSIS — M21261 Flexion deformity, right knee: Secondary | ICD-10-CM | POA: Diagnosis present

## 2021-12-08 DIAGNOSIS — Z20822 Contact with and (suspected) exposure to covid-19: Secondary | ICD-10-CM | POA: Diagnosis present

## 2021-12-08 DIAGNOSIS — J45909 Unspecified asthma, uncomplicated: Secondary | ICD-10-CM | POA: Diagnosis present

## 2021-12-08 DIAGNOSIS — Z6836 Body mass index (BMI) 36.0-36.9, adult: Secondary | ICD-10-CM | POA: Diagnosis not present

## 2021-12-08 DIAGNOSIS — Z886 Allergy status to analgesic agent status: Secondary | ICD-10-CM

## 2021-12-08 DIAGNOSIS — Z882 Allergy status to sulfonamides status: Secondary | ICD-10-CM | POA: Diagnosis not present

## 2021-12-08 DIAGNOSIS — D649 Anemia, unspecified: Secondary | ICD-10-CM | POA: Diagnosis present

## 2021-12-08 DIAGNOSIS — Z01818 Encounter for other preprocedural examination: Principal | ICD-10-CM

## 2021-12-08 DIAGNOSIS — K5900 Constipation, unspecified: Secondary | ICD-10-CM | POA: Diagnosis present

## 2021-12-08 HISTORY — PX: TOTAL KNEE ARTHROPLASTY: SHX125

## 2021-12-08 LAB — URINALYSIS, ROUTINE W REFLEX MICROSCOPIC
Bilirubin Urine: NEGATIVE
Glucose, UA: NEGATIVE mg/dL
Hgb urine dipstick: NEGATIVE
Ketones, ur: NEGATIVE mg/dL
Leukocytes,Ua: NEGATIVE
Nitrite: NEGATIVE
Protein, ur: NEGATIVE mg/dL
Specific Gravity, Urine: 1.023 (ref 1.005–1.030)
pH: 5 (ref 5.0–8.0)

## 2021-12-08 SURGERY — ARTHROPLASTY, KNEE, TOTAL
Anesthesia: Regional | Site: Knee | Laterality: Right

## 2021-12-08 MED ORDER — HYDROMORPHONE HCL 1 MG/ML IJ SOLN
INTRAMUSCULAR | Status: AC
  Filled 2021-12-08: qty 1

## 2021-12-08 MED ORDER — BUPIVACAINE LIPOSOME 1.3 % IJ SUSP
INTRAMUSCULAR | Status: AC
  Filled 2021-12-08: qty 20

## 2021-12-08 MED ORDER — PROPOFOL 10 MG/ML IV BOLUS
INTRAVENOUS | Status: DC | PRN
  Administered 2021-12-08 (×2): 20 mg via INTRAVENOUS

## 2021-12-08 MED ORDER — TRANEXAMIC ACID 1000 MG/10ML IV SOLN
INTRAVENOUS | Status: DC | PRN
  Administered 2021-12-08: 09:00:00 2000 mg via TOPICAL

## 2021-12-08 MED ORDER — PHENOL 1.4 % MT LIQD: 1.0000 | OROMUCOSAL | Status: DC | PRN

## 2021-12-08 MED ORDER — MIDAZOLAM HCL 2 MG/2ML IJ SOLN
INTRAMUSCULAR | Status: AC
  Filled 2021-12-08: qty 2

## 2021-12-08 MED ORDER — ACETAMINOPHEN 500 MG PO TABS
ORAL_TABLET | ORAL | Status: AC
  Filled 2021-12-08: qty 2

## 2021-12-08 MED ORDER — ACETAMINOPHEN 500 MG PO TABS
1000.0000 mg | ORAL_TABLET | Freq: Once | ORAL | Status: AC
  Administered 2021-12-08: 07:00:00 1000 mg via ORAL
  Filled 2021-12-08: qty 2

## 2021-12-08 MED ORDER — PROPOFOL 10 MG/ML IV BOLUS
INTRAVENOUS | Status: AC
  Filled 2021-12-08: qty 20

## 2021-12-08 MED ORDER — OXYCODONE HCL 5 MG PO TABS
ORAL_TABLET | ORAL | Status: AC
  Filled 2021-12-08: qty 1

## 2021-12-08 MED ORDER — ASPIRIN 81 MG PO CHEW: 81.0000 mg | CHEWABLE_TABLET | Freq: Two times a day (BID) | ORAL | Status: DC

## 2021-12-08 MED ORDER — FENTANYL CITRATE (PF) 100 MCG/2ML IJ SOLN
INTRAMUSCULAR | Status: AC
  Filled 2021-12-08: qty 2

## 2021-12-08 MED ORDER — RIVAROXABAN 10 MG PO TABS
10.0000 mg | ORAL_TABLET | Freq: Every day | ORAL | Status: DC
  Administered 2021-12-09 – 2021-12-12 (×4): 10 mg via ORAL
  Filled 2021-12-08 (×4): qty 1

## 2021-12-08 MED ORDER — OXYCODONE HCL 5 MG PO TABS
5.0000 mg | ORAL_TABLET | ORAL | Status: DC | PRN
  Administered 2021-12-08: 14:00:00 5 mg via ORAL

## 2021-12-08 MED ORDER — LACTATED RINGERS IV SOLN: INTRAVENOUS | Status: DC

## 2021-12-08 MED ORDER — ONDANSETRON HCL 4 MG/2ML IJ SOLN
INTRAMUSCULAR | Status: AC
  Filled 2021-12-08: qty 2

## 2021-12-08 MED ORDER — ROCURONIUM BROMIDE 10 MG/ML (PF) SYRINGE
PREFILLED_SYRINGE | INTRAVENOUS | Status: AC
  Filled 2021-12-08: qty 10

## 2021-12-08 MED ORDER — VANCOMYCIN HCL 1000 MG IV SOLR
INTRAVENOUS | Status: AC
  Filled 2021-12-08: qty 20

## 2021-12-08 MED ORDER — CLONIDINE HCL (ANALGESIA) 100 MCG/ML EP SOLN
EPIDURAL | Status: DC | PRN
  Administered 2021-12-08: 1 mL via INTRA_ARTICULAR

## 2021-12-08 MED ORDER — CLONIDINE HCL (ANALGESIA) 100 MCG/ML EP SOLN
EPIDURAL | Status: AC
  Filled 2021-12-08: qty 10

## 2021-12-08 MED ORDER — 0.9 % SODIUM CHLORIDE (POUR BTL) OPTIME
TOPICAL | Status: DC | PRN
  Administered 2021-12-08: 08:00:00 1000 mL
  Administered 2021-12-08: 10:00:00 5000 mL

## 2021-12-08 MED ORDER — PHENYLEPHRINE HCL-NACL 20-0.9 MG/250ML-% IV SOLN
INTRAVENOUS | Status: DC | PRN
Start: 2021-12-08 — End: 2021-12-08
  Administered 2021-12-08: 30 ug/min via INTRAVENOUS

## 2021-12-08 MED ORDER — RIVAROXABAN 10 MG PO TABS: 10.0000 mg | ORAL_TABLET | Freq: Every day | ORAL | Status: DC

## 2021-12-08 MED ORDER — BUPIVACAINE LIPOSOME 1.3 % IJ SUSP
INTRAMUSCULAR | Status: DC | PRN
Start: 2021-12-08 — End: 2021-12-08
  Administered 2021-12-08: 20 mL

## 2021-12-08 MED ORDER — CHLORHEXIDINE GLUCONATE 0.12 % MT SOLN
OROMUCOSAL | Status: AC
  Administered 2021-12-08: 07:00:00 15 mL via OROMUCOSAL
  Filled 2021-12-08: qty 15

## 2021-12-08 MED ORDER — BUPIVACAINE IN DEXTROSE 0.75-8.25 % IT SOLN
INTRATHECAL | Status: DC | PRN
  Administered 2021-12-08: 2 mL via INTRATHECAL

## 2021-12-08 MED ORDER — HYDROMORPHONE HCL 1 MG/ML IJ SOLN
0.2500 mg | INTRAMUSCULAR | Status: DC | PRN
  Administered 2021-12-08: 16:00:00 0.5 mg via INTRAVENOUS

## 2021-12-08 MED ORDER — POVIDONE-IODINE 10 % EX SWAB
2.0000 "application " | Freq: Once | CUTANEOUS | Status: AC
  Administered 2021-12-08: 2 via TOPICAL

## 2021-12-08 MED ORDER — ACETAMINOPHEN 500 MG PO TABS
1000.0000 mg | ORAL_TABLET | Freq: Four times a day (QID) | ORAL | Status: AC
  Administered 2021-12-08 – 2021-12-09 (×4): 1000 mg via ORAL
  Filled 2021-12-08 (×3): qty 2

## 2021-12-08 MED ORDER — SODIUM CHLORIDE 0.9% FLUSH
INTRAVENOUS | Status: DC | PRN
  Administered 2021-12-08: 20 mL via INTRAVENOUS

## 2021-12-08 MED ORDER — MORPHINE SULFATE (PF) 4 MG/ML IV SOLN
INTRAVENOUS | Status: AC
  Filled 2021-12-08: qty 2

## 2021-12-08 MED ORDER — ORAL CARE MOUTH RINSE: 15.0000 mL | Freq: Once | OROMUCOSAL | Status: AC

## 2021-12-08 MED ORDER — HYDROMORPHONE HCL 1 MG/ML IJ SOLN
1.0000 mg | INTRAMUSCULAR | Status: DC | PRN
  Administered 2021-12-08 – 2021-12-11 (×6): 1 mg via INTRAVENOUS
  Filled 2021-12-08 (×6): qty 1

## 2021-12-08 MED ORDER — DEXAMETHASONE SODIUM PHOSPHATE 10 MG/ML IJ SOLN
INTRAMUSCULAR | Status: DC | PRN
  Administered 2021-12-08: 5 mg

## 2021-12-08 MED ORDER — VANCOMYCIN HCL 1000 MG IV SOLR
INTRAVENOUS | Status: DC | PRN
  Administered 2021-12-08: 1000 mg via TOPICAL

## 2021-12-08 MED ORDER — SUCCINYLCHOLINE CHLORIDE 200 MG/10ML IV SOSY
PREFILLED_SYRINGE | INTRAVENOUS | Status: AC
  Filled 2021-12-08: qty 10

## 2021-12-08 MED ORDER — CEFAZOLIN SODIUM-DEXTROSE 2-4 GM/100ML-% IV SOLN
2.0000 g | INTRAVENOUS | Status: AC
  Administered 2021-12-08: 08:00:00 2 g via INTRAVENOUS
  Filled 2021-12-08: qty 100

## 2021-12-08 MED ORDER — ONDANSETRON HCL 4 MG/2ML IJ SOLN
INTRAMUSCULAR | Status: DC | PRN
  Administered 2021-12-08: 4 mg via INTRAVENOUS

## 2021-12-08 MED ORDER — DOCUSATE SODIUM 100 MG PO CAPS
100.0000 mg | ORAL_CAPSULE | Freq: Two times a day (BID) | ORAL | Status: DC
  Administered 2021-12-08 – 2021-12-14 (×12): 100 mg via ORAL
  Filled 2021-12-08 (×12): qty 1

## 2021-12-08 MED ORDER — OXYCODONE HCL 5 MG PO TABS
10.0000 mg | ORAL_TABLET | ORAL | Status: DC
  Administered 2021-12-08 – 2021-12-14 (×31): 10 mg via ORAL
  Filled 2021-12-08 (×31): qty 2

## 2021-12-08 MED ORDER — POVIDONE-IODINE 7.5 % EX SOLN
Freq: Once | CUTANEOUS | Status: DC
  Filled 2021-12-08: qty 118

## 2021-12-08 MED ORDER — IRRISEPT - 450ML BOTTLE WITH 0.05% CHG IN STERILE WATER, USP 99.95% OPTIME
TOPICAL | Status: DC | PRN
  Administered 2021-12-08: 09:00:00 450 mL

## 2021-12-08 MED ORDER — FENTANYL CITRATE (PF) 100 MCG/2ML IJ SOLN
25.0000 ug | INTRAMUSCULAR | Status: AC | PRN
  Administered 2021-12-08 (×6): 25 ug via INTRAVENOUS

## 2021-12-08 MED ORDER — BUPIVACAINE HCL (PF) 0.25 % IJ SOLN
INTRAMUSCULAR | Status: AC
  Filled 2021-12-08: qty 30

## 2021-12-08 MED ORDER — TRANEXAMIC ACID-NACL 1000-0.7 MG/100ML-% IV SOLN
1000.0000 mg | INTRAVENOUS | Status: AC
  Administered 2021-12-08: 08:00:00 1000 mg via INTRAVENOUS
  Filled 2021-12-08: qty 100

## 2021-12-08 MED ORDER — PROPOFOL 500 MG/50ML IV EMUL
INTRAVENOUS | Status: DC | PRN
Start: 2021-12-08 — End: 2021-12-08
  Administered 2021-12-08: 100 ug/kg/min via INTRAVENOUS

## 2021-12-08 MED ORDER — HYDROMORPHONE HCL 1 MG/ML IJ SOLN
0.5000 mg | INTRAMUSCULAR | Status: DC | PRN
  Administered 2021-12-08: 15:00:00 0.5 mg via INTRAVENOUS

## 2021-12-08 MED ORDER — MENTHOL 3 MG MT LOZG: 1.0000 | LOZENGE | OROMUCOSAL | Status: DC | PRN

## 2021-12-08 MED ORDER — METHOCARBAMOL 500 MG PO TABS
500.0000 mg | ORAL_TABLET | Freq: Four times a day (QID) | ORAL | Status: DC | PRN
  Administered 2021-12-08 – 2021-12-13 (×7): 500 mg via ORAL
  Filled 2021-12-08 (×6): qty 1

## 2021-12-08 MED ORDER — MIDAZOLAM HCL 2 MG/2ML IJ SOLN
INTRAMUSCULAR | Status: DC | PRN
  Administered 2021-12-08 (×2): 1 mg via INTRAVENOUS

## 2021-12-08 MED ORDER — POVIDONE-IODINE 10 % EX SWAB: 2.0000 "application " | Freq: Once | CUTANEOUS | Status: DC

## 2021-12-08 MED ORDER — MORPHINE SULFATE (PF) 4 MG/ML IV SOLN
INTRAVENOUS | Status: DC | PRN
  Administered 2021-12-08: 8 mg via INTRAVENOUS

## 2021-12-08 MED ORDER — BUPIVACAINE HCL 0.25 % IJ SOLN
INTRAMUSCULAR | Status: DC | PRN
  Administered 2021-12-08: 20 mL

## 2021-12-08 MED ORDER — LIDOCAINE 2% (20 MG/ML) 5 ML SYRINGE
INTRAMUSCULAR | Status: AC
  Filled 2021-12-08: qty 5

## 2021-12-08 MED ORDER — CHLORHEXIDINE GLUCONATE 0.12 % MT SOLN: 15.0000 mL | Freq: Once | OROMUCOSAL | Status: AC

## 2021-12-08 MED ORDER — SODIUM CHLORIDE 0.9 % IR SOLN
Status: DC | PRN
  Administered 2021-12-08 (×2): 3000 mL

## 2021-12-08 MED ORDER — METHOCARBAMOL 500 MG PO TABS
ORAL_TABLET | ORAL | Status: AC
  Filled 2021-12-08: qty 1

## 2021-12-08 MED ORDER — METHOCARBAMOL 1000 MG/10ML IJ SOLN
500.0000 mg | Freq: Four times a day (QID) | INTRAVENOUS | Status: DC | PRN
  Filled 2021-12-08: qty 5

## 2021-12-08 SURGICAL SUPPLY — 99 items
APL SKNCLS STERI-STRIP NONHPOA (GAUZE/BANDAGES/DRESSINGS) ×1
BAG COUNTER SPONGE SURGICOUNT (BAG) ×2 IMPLANT
BAG DECANTER FOR FLEXI CONT (MISCELLANEOUS) ×3 IMPLANT
BAG SPNG CNTER NS LX DISP (BAG) ×1
BAG SURGICOUNT SPONGE COUNTING (BAG) ×1
BANDAGE ESMARK 6X9 LF (GAUZE/BANDAGES/DRESSINGS) ×1 IMPLANT
BENZOIN TINCTURE PRP APPL 2/3 (GAUZE/BANDAGES/DRESSINGS) ×2 IMPLANT
BLADE SAG 18X100X1.27 (BLADE) ×3 IMPLANT
BLADE SAGITTAL (BLADE) ×3
BLADE SAW THK.89X75X18XSGTL (BLADE) ×1 IMPLANT
BNDG CMPR 9X6 STRL LF SNTH (GAUZE/BANDAGES/DRESSINGS) ×1
BNDG CMPR MED 15X6 ELC VLCR LF (GAUZE/BANDAGES/DRESSINGS) ×1
BNDG COHESIVE 6X5 TAN STRL LF (GAUZE/BANDAGES/DRESSINGS) ×5 IMPLANT
BNDG ELASTIC 6X15 VLCR STRL LF (GAUZE/BANDAGES/DRESSINGS) ×3 IMPLANT
BNDG ESMARK 6X9 LF (GAUZE/BANDAGES/DRESSINGS) ×3
BOWL SMART MIX CTS (DISPOSABLE) IMPLANT
BSPLAT TIB 5D C 16NT STM RT (Knees) ×1 IMPLANT
CANISTER WOUND CARE 500ML ATS (WOUND CARE) ×2 IMPLANT
CEMENT BONE SIMPLEX SPEEDSET (Cement) ×4 IMPLANT
CLOSURE STERI-STRIP 1/2X4 (GAUZE/BANDAGES/DRESSINGS) ×2
CLOSURE WOUND 1/2 X4 (GAUZE/BANDAGES/DRESSINGS) ×2
CLSR STERI-STRIP ANTIMIC 1/2X4 (GAUZE/BANDAGES/DRESSINGS) ×2 IMPLANT
CNTNR URN SCR LID CUP LEK RST (MISCELLANEOUS) ×1 IMPLANT
COMP FEM CMT CR PS 56 RT (Joint) ×3 IMPLANT
COMPONENT FEM CMT CR PS 56 RT (Joint) IMPLANT
CONT SPEC 4OZ STRL OR WHT (MISCELLANEOUS) ×3
COOLER ICEMAN CLASSIC (MISCELLANEOUS) ×2 IMPLANT
COVER SURGICAL LIGHT HANDLE (MISCELLANEOUS) ×3 IMPLANT
CUFF TOURN SGL QUICK 34 (TOURNIQUET CUFF) ×3
CUFF TOURN SGL QUICK 42 (TOURNIQUET CUFF) IMPLANT
CUFF TRNQT CYL 34X4.125X (TOURNIQUET CUFF) ×1 IMPLANT
DECANTER SPIKE VIAL GLASS SM (MISCELLANEOUS) ×3 IMPLANT
DRAPE INCISE IOBAN 66X45 STRL (DRAPES) IMPLANT
DRAPE ORTHO SPLIT 77X108 STRL (DRAPES) ×9
DRAPE SURG ORHT 6 SPLT 77X108 (DRAPES) ×3 IMPLANT
DRAPE U-SHAPE 47X51 STRL (DRAPES) ×3 IMPLANT
DRESSING PEEL AND PLAC PRVNA20 (GAUZE/BANDAGES/DRESSINGS) IMPLANT
DRSG AQUACEL AG ADV 3.5X14 (GAUZE/BANDAGES/DRESSINGS) IMPLANT
DRSG PEEL AND PLACE PREVENA 20 (GAUZE/BANDAGES/DRESSINGS) ×3
DURAPREP 26ML APPLICATOR (WOUND CARE) ×6 IMPLANT
ELECT CAUTERY BLADE 6.4 (BLADE) ×3 IMPLANT
ELECT REM PT RETURN 9FT ADLT (ELECTROSURGICAL) ×3
ELECTRODE REM PT RTRN 9FT ADLT (ELECTROSURGICAL) ×1 IMPLANT
GAUZE SPONGE 4X4 12PLY STRL (GAUZE/BANDAGES/DRESSINGS) ×3 IMPLANT
GLOVE SRG 8 PF TXTR STRL LF DI (GLOVE) ×1 IMPLANT
GLOVE SURG ENC MOIS LTX SZ6.5 (GLOVE) ×9 IMPLANT
GLOVE SURG LTX SZ8 (GLOVE) ×3 IMPLANT
GLOVE SURG UNDER POLY LF SZ7 (GLOVE) ×3 IMPLANT
GLOVE SURG UNDER POLY LF SZ8 (GLOVE) ×3
GOWN STRL REUS W/ TWL LRG LVL3 (GOWN DISPOSABLE) ×3 IMPLANT
GOWN STRL REUS W/TWL LRG LVL3 (GOWN DISPOSABLE) ×9
HANDPIECE INTERPULSE COAX TIP (DISPOSABLE) ×3
HDLS TROCR DRIL PIN KNEE 75 (PIN) ×3
HOOD PEEL AWAY FLYTE STAYCOOL (MISCELLANEOUS) ×9 IMPLANT
IMMOBILIZER KNEE 20 (SOFTGOODS) ×3
IMMOBILIZER KNEE 20 THIGH 36 (SOFTGOODS) IMPLANT
IMMOBILIZER KNEE 22 UNIV (SOFTGOODS) ×2 IMPLANT
IMMOBILIZER KNEE 24 THIGH 36 (MISCELLANEOUS) IMPLANT
IMMOBILIZER KNEE 24 UNIV (MISCELLANEOUS)
IMPL ASF PSN RT 14 4-5/CD (Joint) IMPLANT
IMPLANT ASF PSN RT 14 4-5/CD (Joint) ×3 IMPLANT
KIT BASIN OR (CUSTOM PROCEDURE TRAY) ×3 IMPLANT
KIT PREVENA INCISION MGT20CM45 (CANNISTER) ×2 IMPLANT
KIT TURNOVER KIT B (KITS) ×3 IMPLANT
MANIFOLD NEPTUNE II (INSTRUMENTS) ×3 IMPLANT
NDL SPNL 18GX3.5 QUINCKE PK (NEEDLE) ×1 IMPLANT
NEEDLE 22X1 1/2 (OR ONLY) (NEEDLE) ×6 IMPLANT
NEEDLE SPNL 18GX3.5 QUINCKE PK (NEEDLE) ×3 IMPLANT
NS IRRIG 1000ML POUR BTL (IV SOLUTION) ×6 IMPLANT
PACK TOTAL JOINT (CUSTOM PROCEDURE TRAY) ×3 IMPLANT
PAD ARMBOARD 7.5X6 YLW CONV (MISCELLANEOUS) ×6 IMPLANT
PAD CAST 4YDX4 CTTN HI CHSV (CAST SUPPLIES) ×1 IMPLANT
PAD COLD SHLDR WRAP-ON (PAD) ×2 IMPLANT
PADDING CAST COTTON 4X4 STRL (CAST SUPPLIES) ×3
PADDING CAST COTTON 6X4 STRL (CAST SUPPLIES) ×3 IMPLANT
PIN DRILL HDLS TROCAR 75 4PK (PIN) IMPLANT
SCREW FEMALE HEX FIX 25X2.5 (ORTHOPEDIC DISPOSABLE SUPPLIES) ×2 IMPLANT
SET HNDPC FAN SPRY TIP SCT (DISPOSABLE) ×1 IMPLANT
STEM POLY PAT PLY 32M KNEE (Knees) ×2 IMPLANT
STEM TIB ST PERS 14+30 (Stem) ×2 IMPLANT
STEM TIBIA 5 DEG SZ C R KNEE (Knees) IMPLANT
STRIP CLOSURE SKIN 1/2X4 (GAUZE/BANDAGES/DRESSINGS) ×4 IMPLANT
SUCTION FRAZIER HANDLE 10FR (MISCELLANEOUS) ×3
SUCTION TUBE FRAZIER 10FR DISP (MISCELLANEOUS) ×1 IMPLANT
SUT MNCRL AB 3-0 PS2 18 (SUTURE) ×3 IMPLANT
SUT VIC AB 0 CT1 27 (SUTURE) ×9
SUT VIC AB 0 CT1 27XBRD ANBCTR (SUTURE) ×3 IMPLANT
SUT VIC AB 1 CT1 36 (SUTURE) ×15 IMPLANT
SUT VIC AB 2-0 CT1 27 (SUTURE) ×12
SUT VIC AB 2-0 CT1 TAPERPNT 27 (SUTURE) ×4 IMPLANT
SYR 30ML LL (SYRINGE) ×9 IMPLANT
SYR TB 1ML LUER SLIP (SYRINGE) ×3 IMPLANT
TIBIA STEM 5 DEG SZ C R KNEE (Knees) ×3 IMPLANT
TOWEL GREEN STERILE (TOWEL DISPOSABLE) ×6 IMPLANT
TOWEL GREEN STERILE FF (TOWEL DISPOSABLE) ×6 IMPLANT
TRAY CATH 16FR W/PLASTIC CATH (SET/KITS/TRAYS/PACK) IMPLANT
TRAY FOLEY W/BAG SLVR 14FR LF (SET/KITS/TRAYS/PACK) ×2 IMPLANT
WATER STERILE IRR 1000ML POUR (IV SOLUTION) IMPLANT
YANKAUER SUCT BULB TIP NO VENT (SUCTIONS) ×3 IMPLANT

## 2021-12-08 NOTE — Transfer of Care (Signed)
Immediate Anesthesia Transfer of Care Note ? ?Patient: [name] ? ?Procedure(s) Performed: RIGHT TOTAL KNEE ARTHROPLASTY (Right: Knee) ? ?Patient Location: PACU ? ?Anesthesia Type:MAC and Regional ? ?Level of Consciousness: awake, oriented, drowsy and patient cooperative ? ?Airway & Oxygen Therapy: Patient Spontanous Breathing and Patient connected to face mask oxygen ? ?Post-op Assessment: Report given to RN, Post -op Vital signs reviewed and stable and Patient moving all extremities X 4 ? ?Post vital signs: Reviewed and stable ? ?Last Vitals:  ?Vitals Value Taken Time  ?BP 151/97 12/08/21 1057  ?Temp    ?Pulse 99 12/08/21 1058  ?Resp 19 12/08/21 1058  ?SpO2 99 % 12/08/21 1058  ?Vitals shown include unvalidated device data. ? ?Last Pain:  ?Vitals:  ? 12/08/21 0631  ?TempSrc:   ?PainSc: 4   ?   ? ?  ? ?Complications: No notable events documented. ?

## 2021-12-08 NOTE — Brief Op Note (Signed)
? ?  12/08/2021 ? ?11:15 AM ? ?PATIENT:  [name]  71 y.o. female ? ?PRE-OPERATIVE DIAGNOSIS:  right knee osteoarthritis ? ?POST-OPERATIVE DIAGNOSIS:  right knee osteoarthritis ? ?PROCEDURE:  Procedure(s): ?RIGHT TOTAL KNEE ARTHROPLASTY ? ?SURGEON:  Surgeon(s): ?Meredith Pel, MD ? ?ASSISTANT: magnant pa ? ?ANESTHESIA:   spinal ? ?EBL: 50 ml   ? ?Total I/O ?In: 1400 [I.V.:1400] ?Out: 50 [Blood:50] ? ?BLOOD ADMINISTERED: none ? ?DRAINS: none  ? ?LOCAL MEDICATIONS USED:   ? ?SPECIMEN:  No Specimen ? ?COUNTS:  YES ? ?TOURNIQUET:   ?Total Tourniquet Time Documented: ?Thigh (Right) - 5 minutes ?Thigh (Right) - 94 minutes ?Total: Thigh (Right) - 99 minutes ? ? ?DICTATION: .Other Dictation: Dictation Number done ? ?PLAN OF CARE: Admit to inpatient  ? ?PATIENT DISPOSITION:  PACU - hemodynamically stable ? ? ? ? ? ? ? ? ? ? ? ? ?  ?

## 2021-12-08 NOTE — Anesthesia Postprocedure Evaluation (Signed)
Anesthesia Post Note ? ?Patient: [name] ? ?Procedure(s) Performed: RIGHT TOTAL KNEE ARTHROPLASTY (Right: Knee) ? ?  ? ?Patient location during evaluation: PACU ?Anesthesia Type: Regional and Spinal ?Level of consciousness: oriented and awake and alert ?Pain management: pain level controlled ?Vital Signs Assessment: post-procedure vital signs reviewed and stable ?Respiratory status: spontaneous breathing, respiratory function stable and patient connected to nasal cannula oxygen ?Cardiovascular status: blood pressure returned to baseline and stable ?Postop Assessment: no headache, no backache and no apparent nausea or vomiting ?Anesthetic complications: no ? ? ?No notable events documented. ? ?Last Vitals:  ?Vitals:  ? 12/08/21 1112 12/08/21 1130  ?BP: 139/81 (!) 142/86  ?Pulse: 95 92  ?Resp: 16 15  ?Temp:    ?SpO2: 100% 90%  ?  ?Last Pain:  ?Vitals:  ? 12/08/21 1130  ?TempSrc:   ?PainSc: 0-No pain  ? ? ?  ?  ?RLE Motor Response: Purposeful movement (12/08/21 1130) ?  ?  ?  ? ?Kruz Chiu L Jakiyah Stepney ? ? ? ? ?

## 2021-12-08 NOTE — H&P (Addendum)
TOTAL KNEE ADMISSION H&P  Patient is being admitted for right total knee arthroplasty.  Subjective:  Chief Complaint:right knee pain.  HPI: Victoria Swor Hirschi, 71 y.o. female, has a history of pain and functional disability in the right knee due to arthritis and has failed non-surgical conservative treatments for greater than 12 weeks to includeNSAID's and/or analgesics, activity modification gel injections cortisone injections all without relief..  Onset of symptoms was gradual, starting >10 years ago with gradually worsening course since that time. The patient noted no past surgery on the right knee(s).  Patient currently rates pain in the right knee(s) at 9 out of 10 with activity. Patient has night pain, worsening of pain with activity and weight bearing, pain that interferes with activities of daily living, pain with passive range of motion, crepitus, and joint swelling.  Patient has evidence of subchondral sclerosis and joint space narrowing by imaging studies. This patient has had  failure of conservative treatment and is currently significantly incapacitated due to the pain in both knees with the right being the worst knee.  She has had to recently live in a skilled nursing home.  She has family support with her sister here.  Anticipate skilled nursing stay for 2 to 3 weeks following surgery in order to facilitate return to independent living.  The patient does tend to run slightly low hemoglobin chronically.  Patient has increased body mass index as well which can contribute to complications.  Nonetheless her quality of life has been diminished to the point where she is willing to undergo the risk of surgery in order to facilitate pain relief and return to independent living. . There is no active infection.  Patient Active Problem List   Diagnosis Date Noted   Pressure injury of skin 08/27/2021   Fall 08/23/2021   Difficulty in walking 08/23/2021   Bilateral lower extremity edema 08/23/2021    Hormone replacement therapy 08/23/2021   Osteoporosis 01/28/2021   Osteoarthritis of right wrist 01/28/2021   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   Foot pain 09/13/2020   Knee pain 08/24/2018   Fall in home 07/14/2015   TIBIALIS TENDINITIS 08/25/2009   CHEST PAIN, OTHER, PAIN 10/21/2008   PEDAL EDEMA 07/22/2008   ANKLE PAIN, BILATERAL 04/01/2008   LOW BACK PAIN 04/01/2008   ACUTE BRONCHITIS 09/23/2007   SARCOIDOSIS 09/02/2007   Asthma 09/02/2007   Diverticulosis of colon 07/11/2007   BACK PAIN, CHRONIC 07/11/2007   Past Medical History:  Diagnosis Date   Acute bronchitis    Allergy    Anxiety    Arthritis    knees   Asthma    Backache, unspecified    Cataract    removed both eyes   Depression    Diverticulitis    Diverticulosis of colon (without mention of hemorrhage)    Edema    Glaucoma    Lumbago    Obesity    Other chest pain    Pain in joint, ankle and foot    Sarcoidosis    Tibialis tendinitis    Unspecified asthma(493.90)     Past Surgical History:  Procedure Laterality Date   ABDOMINAL HYSTERECTOMY     CATARACT EXTRACTION W/PHACO Right 06/24/2014   Procedure: CATARACT EXTRACTION PHACO AND INTRAOCULAR LENS PLACEMENT ([place]) RIGHT EYE WITH GONIOSYNECHIALYSIS;  Surgeon: Marylynn Pearson, MD;  Location: [facility];  Service: Ophthalmology;  Laterality: Right;   COLONOSCOPY     EYE SURGERY Left    cataract surgery   HAND SURGERY Right  trigger finger release   IR RADIOLOGIST EVAL & MGMT  11/14/2018   KNEE ARTHROSCOPY Right     Current Facility-Administered Medications  Medication Dose Route Frequency Provider Last Rate Last Admin   ceFAZolin (ANCEF) IVPB 2g/100 mL premix  2 g Intravenous On Call to OR Magnant, Charles L, PA-C       lactated ringers infusion   Intravenous Continuous Woodrum, Chelsey L, MD       povidone-iodine (BETADINE) 7.5 % scrub   Topical Once Magnant, Charles L, PA-C       povidone-iodine 10 % swab 2 application.  2 application. Topical Once  Magnant, Charles L, PA-C       tranexamic acid (CYKLOKAPRON) 2,000 mg in sodium chloride 0.9 % 50 mL Topical Application  6,301 mg Topical To OR Marlou Sa, Tonna Corner, MD       tranexamic acid (CYKLOKAPRON) IVPB 1,000 mg  1,000 mg Intravenous To OR Magnant, Charles L, PA-C       Allergies  Allergen Reactions   Ibuprofen Rash   Sulfa Antibiotics Rash    Social History   Tobacco Use   Smoking status: Never   Smokeless tobacco: Never  Substance Use Topics   Alcohol use: Yes    Comment: 1 glass of wine or mixed drink once a week    Family History  Problem Relation Age of Onset   Hypertension Mother    Stroke Mother    Diabetes Father    Sarcoidosis Sister    Colon cancer Neg Hx    Colon polyps Neg Hx    Esophageal cancer Neg Hx    Rectal cancer Neg Hx    Stomach cancer Neg Hx      Review of Systems  Musculoskeletal:  Positive for arthralgias.  All other systems reviewed and are negative.  Objective:  Physical Exam Vitals reviewed.  HENT:     Head: Normocephalic.     Nose: Nose normal.     Mouth/Throat:     Mouth: Mucous membranes are moist.  Eyes:     Pupils: Pupils are equal, round, and reactive to light.  Cardiovascular:     Rate and Rhythm: Normal rate.     Pulses: Normal pulses.  Pulmonary:     Effort: Pulmonary effort is normal.  Abdominal:     General: Abdomen is flat.  Musculoskeletal:     Cervical back: Normal range of motion.  Skin:    General: Skin is warm.     Capillary Refill: Capillary refill takes less than 2 seconds.  Neurological:     General: No focal deficit present.     Mental Status: She is alert.  Psychiatric:        Mood and Affect: Mood normal.   Ortho exam demonstrates palpable pedal pulses.  She has flexion to about 100 degrees but a 20 degree flexion contracture on the right-hand side.  Collaterals are stable.  Extensor mechanism intact.  No groin pain with internal and external rotation of the leg.  Skin is intact on the right-hand  side.  Ankle dorsiflexion intact. Vital signs in last 24 hours: Temp:  [97.9 F (36.6 C)] 97.9 F (36.6 C) (03/09 0610) Pulse Rate:  [105] 105 (03/09 0611) Resp:  [18] 18 (03/09 0610) BP: (150)/(80) 150/80 (03/09 0610) SpO2:  [98 %] 98 % (03/09 0611)  Labs:   Estimated body mass index is 35.71 kg/m as calculated from the following:   Height as of 12/06/21: '5\' 1"'$  (1.549 m).  Weight as of 12/06/21: 85.7 kg.   Imaging Review Plain radiographs demonstrate severe degenerative joint disease of the right knee(s). The overall alignment ismild varus. The bone quality appears to be good for age and reported activity level.      Assessment/Plan:  End stage arthritis, right knee   The patient history, physical examination, clinical judgment of the provider and imaging studies are consistent with end stage degenerative joint disease of the right knee(s) and total knee arthroplasty is deemed medically necessary. The treatment options including medical management, injection therapy arthroscopy and arthroplasty were discussed at length. The risks and benefits of total knee arthroplasty were presented and reviewed. The risks due to aseptic loosening, infection, stiffness, patella tracking problems, thromboembolic complications and other imponderables were discussed. The patient acknowledged the explanation, agreed to proceed with the plan and consent was signed. Patient is being admitted for inpatient treatment for surgery, pain control, PT, OT, prophylactic antibiotics, VTE prophylaxis, progressive ambulation and ADL's and discharge planning. The patient is planning to be discharged to skilled nursing facility    Anticipated LOS equal to or greater than 2 midnights due to - Age 13 and older with one or more of the following:  - Obesity  - Expected need for hospital services (PT, OT, Nursing) required for safe  discharge  - Anticipated need for postoperative skilled nursing care or inpatient  rehab  - Active co-morbidities: Mild anemia OR   - Unanticipated findings during/Post Surgery: None  - Patient is a high risk of re-admission due to: Barriers to post-acute care (logistical, no family support in home)

## 2021-12-08 NOTE — Progress Notes (Signed)
ANTICOAGULATION CONSULT NOTE - Initial Consult ? ?Pharmacy Consult for Xarelto ?Indication: VTE prophylaxis on POD #1 (TKA) ? ?Allergies  ?Allergen Reactions  ? Ibuprofen Rash  ? Sulfa Antibiotics Rash  ? ? ?Patient Measurements: ?  ? ?Vital Signs: ?Temp: 97.2 ?F (36.2 ?C) (03/09 1551) ?Temp Source: Oral (03/09 0610) ?BP: 177/91 (03/09 1621) ?Pulse Rate: 93 (03/09 1621) ? ?Labs: ?Recent Labs  ?  12/06/21 ?1333  ?HGB 9.6*  ?HCT 31.8*  ?PLT 665*  ?CREATININE 0.46  ? ? ?Estimated Creatinine Clearance: 63.2 mL/min (by C-G formula based on SCr of 0.46 mg/dL). ? ? ?Medical History: ?Past Medical History:  ?Diagnosis Date  ? Acute bronchitis   ? Allergy   ? Anxiety   ? Arthritis   ? knees  ? Asthma   ? Backache, unspecified   ? Cataract   ? removed both eyes  ? Depression   ? Diverticulitis   ? Diverticulosis of colon (without mention of hemorrhage)   ? Edema   ? Glaucoma   ? Lumbago   ? Obesity   ? Other chest pain   ? Pain in joint, ankle and foot   ? Sarcoidosis   ? Tibialis tendinitis   ? Unspecified asthma(493.90)   ? ? ?Medications:  ?Medications Prior to Admission  ?Medication Sig Dispense Refill Last Dose  ? acetaminophen (TYLENOL) 500 MG tablet Take 1,000 mg by mouth in the morning and at bedtime.   12/07/2021  ? cyclobenzaprine (FLEXERIL) 5 MG tablet Take 5 mg by mouth every 8 (eight) hours as needed for muscle spasms.   12/04/2021  ? cycloSPORINE (RESTASIS) 0.05 % ophthalmic emulsion Place 1 drop into both eyes 2 (two) times daily.   12/07/2021  ? docusate sodium (COLACE) 100 MG capsule Take 100 mg by mouth daily.   12/07/2021  ? DULoxetine (CYMBALTA) 30 MG capsule Take 30 mg by mouth daily.   12/07/2021  ? estradiol (ESTRACE) 1 MG tablet Take 1 tablet (1 mg total) by mouth daily. 90 tablet 3 12/07/2021  ? fluticasone (FLONASE) 50 MCG/ACT nasal spray Place 1 spray into both nostrils daily. (Patient taking differently: Place 1 spray into both nostrils daily as needed for allergies or rhinitis.) 16 g 3 11/30/2021  ?  levocetirizine (XYZAL ALLERGY 24HR) 5 MG tablet Take 1 tablet (5 mg total) by mouth every evening. (Patient taking differently: Take 5 mg by mouth daily.) 30 tablet 3 12/07/2021  ? omeprazole (PRILOSEC) 20 MG capsule Take 20 mg by mouth in the morning and at bedtime.   12/07/2021  ? oxyCODONE (OXYCONTIN) 15 mg 12 hr tablet Take 15 mg by mouth daily as needed (osteoarthritis).     ? polyethylene glycol powder (GLYCOLAX/MIRALAX) 17 GM/SCOOP powder Take 17 g by mouth daily as needed. (Patient taking differently: Take 17 g by mouth daily as needed for mild constipation.) 225 g 0   ? traMADol (ULTRAM) 50 MG tablet Take 50 mg by mouth every 6 (six) hours as needed for moderate pain.   12/07/2021  ? Vitamin D, Ergocalciferol, (DRISDOL) 1.25 MG (50000 UNIT) CAPS capsule Take 1 capsule (50,000 Units total) by mouth every 7 (seven) days. 12 capsule 0 Past Week  ? WIXELA INHUB 250-50 MCG/ACT AEPB INHALE 1 PUFF BY MOUTH TWICE A DAY (Patient taking differently: Inhale 1 puff into the lungs in the morning and at bedtime.) 120 each 1 12/07/2021  ? albuterol (VENTOLIN HFA) 108 (90 Base) MCG/ACT inhaler TAKE 2 PUFFS BY MOUTH EVERY 6 HOURS AS NEEDED (Patient  taking differently: Inhale 2 puffs into the lungs every 6 (six) hours as needed for shortness of breath or wheezing.) 18 g 3 Unknown  ? furosemide (LASIX) 20 MG tablet Take 20 mg by mouth daily as needed for edema.   More than a month  ? guaiFENesin-dextromethorphan (ROBITUSSIN DM) 100-10 MG/5ML syrup Take 10 mLs by mouth every 6 (six) hours as needed for cough.   More than a month  ? ondansetron (ZOFRAN-ODT) 8 MG disintegrating tablet Take 8 mg by mouth 2 (two) times daily as needed for nausea/vomiting.   More than a month  ? ? ?Assessment: ? 71 y.o. female who presents for a Right total knee arthroplasty with Dr. Marlou Sa. Patient was not on anticoagulation prior to admission. Now s/p TKA, pharmacy consulted to start Xarelto for VTE prophylaxis on POD #1. Prior to procedure Hgb 9.6, plt  665. ? ? ?Goal of Therapy:  ?VTE prophylaxis ?Monitor platelets by anticoagulation protocol: Yes ?  ?Plan:  ?Xarelto 10 mg PO daily x 12 days (Starting on 12/09/21) ?Monitor for bleeding ? ? ? ?Thank you for allowing [name] to participate in this patients care. ?Jens Som, PharmD ?12/08/2021 5:36 PM ? ?**Pharmacist phone directory can be found on [email or web address] listed under [facility]** ? ? ?

## 2021-12-08 NOTE — Evaluation (Signed)
Physical Therapy Evaluation ?Patient Details ?Name: [name] ?MRN: [id number] ?DOB: [birth year] ?Today's Date: 12/08/2021 ? ?History of Present Illness ? 71 y/o female admitted on 12/08/21 following R TKA. PMH: HTN, glaucoma, carpal tunnel, obesity  ?Clinical Impression ? Patient seen POD #0 following R TKA. Patient severely limited by increased pain with slight movement. Patient presents with deficits listed below. Patient required maxA to attempt EOB this date but unable to complete due to pain. Educated patient on knee immobilizer, CPM, ankle pumps, and bone foam, unsure of comprehension due to STM deficits noted suspect due to medications. Notified RN to allow patient to rest for ~1 hour before placing back into CPM due to current pain level. Patient will benefit from skilled PT services during acute stay to address listed deficits. PT will follow acutely.  ?   ? ?Recommendations for follow up therapy are one component of a multi-disciplinary discharge planning process, led by the attending physician.  Recommendations may be updated based on patient status, additional functional criteria and insurance authorization. ? ?Follow Up Recommendations Follow physician's recommendations for discharge plan and follow up therapies ? ?  ?Assistance Recommended at Discharge Frequent or constant Supervision/Assistance  ?Patient can return home with the following ? Two people to help with walking and/or transfers;A lot of help with bathing/dressing/bathroom;Assistance with cooking/housework;Assist for transportation;Help with stairs or ramp for entrance ? ?  ?Equipment Recommendations Rolling [employer] (2 wheels);BSC/3in1  ?Recommendations for Other Services ?    ?  ?Functional Status Assessment Patient has had a recent decline in their functional status and/or demonstrates limited ability to make significant improvements in function in a reasonable and predictable amount of time  ? ?  ?Precautions / Restrictions  Precautions ?Precautions: Fall;Knee ?Precaution Booklet Issued: No ?Required Braces or Orthoses: Knee Immobilizer - Right ?Knee Immobilizer - Right: On when out of bed or walking ?Restrictions ?Weight Bearing Restrictions: Yes ?RLE Weight Bearing: Weight bearing as tolerated  ? ?  ? ?Mobility ? Bed Mobility ?Overal bed mobility: Needs Assistance ?Bed Mobility: Supine to Sit ?  ?  ?Supine to sit: Max assist ?  ?  ?General bed mobility comments: attempted EOB. Required maxA to bring LEs towards EOB and attempt trunk elevation but unable to complete due to patient reporting excruciating pain ?  ? ?Transfers ?  ?  ?  ?  ?  ?  ?  ?  ?  ?General transfer comment: unable to attempt due to pain ?  ? ?Ambulation/Gait ?  ?  ?  ?  ?  ?  ?  ?  ? ?Stairs ?  ?  ?  ?  ?  ? ?Wheelchair Mobility ?  ? ?Modified Rankin (Stroke Patients Only) ?  ? ?  ? ?Balance Overall balance assessment: Needs assistance ?Sitting-balance support: Bilateral upper extremity supported ?Sitting balance-Leahy Scale: Poor ?  ?  ?  ?  ?  ?  ?  ?  ?  ?  ?  ?  ?  ?  ?  ?  ?   ? ? ? ?Pertinent Vitals/Pain Pain Assessment ?Pain Assessment: 0-10 ?Pain Score: 9  ?Pain Location: R knee ?Pain Descriptors / Indicators: Guarding, Grimacing, Sharp ?Pain Intervention(s): Monitored during session, Limited activity within patient's tolerance, Repositioned  ? ? ?Home Living Family/patient expects to be discharged to:: Skilled nursing facility ?  ?  ?  ?  ?  ?  ?  ?  ?  ?Additional Comments: plan is to go to SNF and then  return home. Normally, lives at home by herself and has a ramped entrance. Patient has w/c, RW, BSC, adaptive equipment (sock aide, reacher). Recently living at ALF for respite care per sister.  ?  ?Prior Function Prior Level of Function : Needs assist ?  ?  ?  ?Physical Assist : ADLs (physical);Mobility (physical) ?Mobility (physical): Transfers;Bed mobility;Gait ?ADLs (physical): Bathing;Dressing ?Mobility Comments: using RW and having assistance at ALF  from staff ?ADLs Comments: Required assistance from staff to perform ADLs ?  ? ? ?Hand Dominance  ? Dominant Hand: Right ? ?  ?Extremity/Trunk Assessment  ? Upper Extremity Assessment ?Upper Extremity Assessment: Generalized weakness (hx of carpal tunnel bilaterally) ?  ? ?Lower Extremity Assessment ?Lower Extremity Assessment: RLE deficits/detail ?RLE Deficits / Details: unable to perform SLR ?RLE: Unable to fully assess due to pain ?  ? ?Cervical / Trunk Assessment ?Cervical / Trunk Assessment: Kyphotic  ?Communication  ? Communication: No difficulties  ?Cognition Arousal/Alertness: Awake/alert ?Behavior During Therapy: [facility] for tasks assessed/performed ?Overall Cognitive Status: Within Functional Limits for tasks assessed ?  ?  ?  ?  ?  ?  ?  ?  ?  ?  ?  ?  ?  ?  ?  ?  ?  ?  ?  ? ?  ?General Comments General comments (skin integrity, edema, etc.): Educated patient on CPM, knee immobilizer, ankle pumps and bone foam, unsure of comprehension due to repeated questions and STM suspect due to medications ? ?  ?Exercises Total Joint Exercises ?Ankle Circles/Pumps: AROM, Both, 5 reps, Supine ?Goniometric ROM: Knee flexion 47 degrees, extension lacking 15 degrees  ? ?Assessment/Plan  ?  ?PT Assessment Patient needs continued PT services  ?PT Problem List Decreased strength;Decreased activity tolerance;Decreased balance;Decreased mobility;Decreased knowledge of precautions;Pain ? ?   ?  ?PT Treatment Interventions DME instruction;Gait training;Functional mobility training;Therapeutic activities;Balance training;Therapeutic exercise;Patient/family education   ? ?PT Goals (Current goals can be found in the Care Plan section)  ?Acute Rehab PT Goals ?Patient Stated Goal: to get stronger so I can go home after rehab ?PT Goal Formulation: With patient/family ?Time For Goal Achievement: 12/22/21 ?Potential to Achieve Goals: Fair ? ?  ?Frequency 7X/week ?  ? ? ?Co-evaluation   ?  ?  ?  ?  ? ? ?  ?AM-PAC PT "6 Clicks" Mobility   ?Outcome Measure Help needed turning from your back to your side while in a flat bed without using bedrails?: A Lot ?Help needed moving from lying on your back to sitting on the side of a flat bed without using bedrails?: A Lot ?Help needed moving to and from a bed to a chair (including a wheelchair)?: Total ?Help needed standing up from a chair using your arms (e.g., wheelchair or bedside chair)?: Total ?Help needed to walk in hospital room?: Total ?Help needed climbing 3-5 steps with a railing? : Total ?6 Click Score: 8 ? ?  ?End of Session Equipment Utilized During Treatment: Right knee immobilizer ?Activity Tolerance: Patient limited by pain ?Patient left: in bed;with call bell/phone within reach;with bed alarm set;with family/visitor present ?Nurse Communication: Mobility status ?PT Visit Diagnosis: Unsteadiness on feet (R26.81);Muscle weakness (generalized) (M62.81);Difficulty in walking, not elsewhere classified (R26.2);Pain ?Pain - Right/Left: Right ?Pain - part of body: Knee ?  ? ?Time: 7035-0093 ?PT Time Calculation (min) (ACUTE ONLY): 38 min ? ? ?Charges:   PT Evaluation ?$PT Eval Moderate Complexity: 1 Mod ?PT Treatments ?$Therapeutic Activity: 23-37 mins ?  ?   ? ? ?Shellye Zandi A.  Gilford Rile, PT, DPT ?Acute Rehabilitation Services ?Pager [phone] ?Office [phone] ? ? ?Tionna Gigante A Mosetta Ferdinand ?12/08/2021, 5:14 PM ? ?

## 2021-12-08 NOTE — Progress Notes (Signed)
Orthopedic Tech Progress Note ?Patient Details:  ?[name] ?December 15, 1950 ?[id number] ? ?CPM Right Knee ?CPM Right Knee: On ?Right Knee Flexion (Degrees): 10 ?Right Knee Extension (Degrees): 40 ? ?Post Interventions ?Patient Tolerated: Well ? ?[name] A Caison Hearn ?12/08/2021, 11:52 AM ? ?

## 2021-12-08 NOTE — Op Note (Signed)
NAME: [name], Victoria B. ?MEDICAL RECORD NO: [id number] ?ACCOUNT NO: [id number] ?DATE OF BIRTH: 09/26/1951 ?FACILITY: [facility] ?LOCATION: [facility] ?PHYSICIAN: Yetta Barre. Marlou Sa, MD ? ?Operative Report  ? ?DATE OF PROCEDURE: 12/08/2021 ? ?PREOPERATIVE DIAGNOSIS:  Right knee arthritis. ? ?POSTOPERATIVE DIAGNOSIS:  Right knee arthritis. ? ?PROCEDURE:  Right total knee replacement using Biomet components cemented, medial congruent knee 5 narrow femur, size C tibial tray with 14 mm medial congruent polyethylene spacer and 32 mm cemented 3-peg patella. ? ?SURGEON:  Yetta Barre. Marlou Sa, MD ? ?ASSISTANT:  Annie Main, PA ? ?INDICATIONS:  The patient is a 71 year old patient with end-stage right knee arthritis who presents for operative management after explanation of risks and benefits. ? ?DESCRIPTION OF PROCEDURE:  The patient was brought to the operating room where spinal anesthetic was induced.  Preoperative antibiotics administered.  Timeout was called.  Right leg was prescrubbed with alcohol and Betadine, allowed to air dry, prepped  ?with DuraPrep solution and draped in sterile manner.  Ioban used to cover the operative field.  Range of motion preoperatively was 20-200.  Timeout was called.  Anterior approach to knee was made after elevating and exsanguinating the leg with the  ?Esmarch wrap.  Initial tourniquet pressure 300, 5 minutes into this, we had to get higher pressure because of the nature of the soft tissue envelope around the leg.  IrriSept solution used after the incision.  Median parapatellar arthrotomy was made marked  ?with #1 Vicryl suture.  IrriSept solution used at this time as well, precise location of the arthrotomy marked with #1 Vicryl suture.  Fat pad was partially excised.  Medial soft tissue dissection was performed proportion to the patient's mild  ?preoperative varus deformity.  Severe arthritis was present in all three compartments.  Partial synovectomy was required.  Soft tissue removed from the anterior  distal femur.  Lateral patellofemoral ligament was released.  Medial and lateral gutters were ? recreated with a partial synovectomy.  Bleeding points encountered controlled using electrocautery.  Next, the patella was everted, knee was flexed.  Bone quality was not sufficient for the press-fit prosthesis.  Osteophytes were removed from the femur  ?and tibia.  Intramedullary alignment was then used to cut the tibia perpendicular to the mechanical axis 2 mm off the most affected medial tibial plateau.  Collaterals and posterior neurovascular structures were protected.  Intramedullary alignment was  ?then used to cut the distal femur in 5 degrees of valgus.  This 12 mm resection was chosen because of the patient's 20-degree preoperative flexion deformity.  After making the tibial cut, and the distal femoral cut the femur was sized to a size 5.  This  ?guide was placed in 3 degrees of external rotation, which created symmetric flexion and extension gap.  The anterior, posterior and chamfer cuts were made.  PCL was preserved.  Next, trial components placed.  The tibia was best suited for a size C tibial ? tray, which was placed centered around the medial third of the tibial tubercle.  Reduction was then performed with both 12 and a 14 spacer.  A spacer gave very good stability to varus and valgus stress at 0, 30 and 90 degrees.  Patella was then cut from ? 20 down to 12 mm and a 3 PEG patella was placed.  With trial implants in position the patient had good stability to varus and valgus stress at 0, 30 and 90 degrees and excellent patellar tracking using no thumbs technique.  Trial components were  ?removed.  Prior to removing the tibial baseplate final preparation was made for a small stem because of the patient's increased body mass index.  Next, thorough irrigation was performed with 3 liters of pulsatile irrigation.  Capsule anesthetized using  ?combination of Marcaine, Exparel and saline.  After this TXA sponge was  allowed to sit with the IrriSept solution for 3 minutes on the incision.  These were removed.  IrriSept solution was then used in the canal of the tibia.  Bone plug placed.   ?Vancomycin powder placed.  All bony surfaces were dried and the components were cemented into position on the femur, tibia and patella.  Excess cement was removed.  Cement hardening allowed to occur, 14 spacer was placed.  At this time, thorough  ?irrigation was performed.  Same stability parameters were maintained.  Tourniquet released.  Bleeding points encountered controlled using electrocautery, pouring irrigation utilized at this time, the femur was then closed.  The arthrotomy closed-over  ?bolster using #1 Vicryl suture.  Prior arthrotomy closure, the joint was irrigated with IrriSept solution, which was sucked out and then vancomycin powder was placed.  Arthrotomy closed completely.  Then, using #1 Vicryl suture.  A solution of Marcaine,  ?morphine, clonidine injected into the knee.  The rest of the closure was performed using 0 Vicryl suture, 2-0 Vicryl suture, and 3-0 Monocryl.  Prevena wound VAC was applied due to the patient's mildly elevated risk of perioperative infection.  We will  ?take that off postop day 5 and change it to an Aquacel with Steri-Strips.  Luke's assistance was required at all times.  Bulky dressing iceman applied.  The patient transferred to the recovery room in stable condition.  Luke's assistance was required at  ?all times for opening, closing, mobilization of tissue.  His assistance was a medical necessity. ? ? ?PUS ?D: 12/08/2021 11:23:41 am T: 12/08/2021 5:30:00 pm  ?JOB: [id number]/ [id number]  ?

## 2021-12-08 NOTE — Progress Notes (Signed)
Spoke to Dr. Marlou Sa.  Okay to cancel U/A and Culture.  ?

## 2021-12-08 NOTE — Anesthesia Procedure Notes (Signed)
Spinal ? ?Patient location during procedure: OR ?Start time: 12/08/2021 7:50 AM ?End time: 12/08/2021 7:53 AM ?Reason for block: surgical anesthesia ?Staffing ?Performed: anesthesiologist  ?Anesthesiologist: Freddrick March, MD ?Preanesthetic Checklist ?Completed: patient identified, IV checked, risks and benefits discussed, surgical consent, monitors and equipment checked, pre-op evaluation and timeout performed ?Spinal Block ?Patient position: sitting ?Prep: DuraPrep and site prepped and draped ?Patient monitoring: cardiac monitor, continuous pulse ox and blood pressure ?Approach: midline ?Location: L3-4 ?Injection technique: single-shot ?Needle ?Needle type: Quincke  ?Needle gauge: 22 G ?Needle length: 9 cm ?Assessment ?Sensory level: T6 ?Events: CSF return ?Additional Notes ?Functioning IV was confirmed and monitors were applied. Sterile prep and drape, including hand hygiene and sterile gloves were used. The patient was positioned and the spine was prepped. The skin was anesthetized with lidocaine.  Free flow of clear CSF was obtained prior to injecting local anesthetic into the CSF.  The spinal needle aspirated freely following injection.  The needle was carefully withdrawn.  The patient tolerated the procedure well.  ? ? ? ?

## 2021-12-08 NOTE — Progress Notes (Signed)
Called Pickett PA regarding patient's uncontrolled pain and HTN.  He reports he will look at her orders and give new orders.  ?

## 2021-12-08 NOTE — Anesthesia Procedure Notes (Signed)
Anesthesia Regional Block: Adductor canal block  ? ?Pre-Anesthetic Checklist: , timeout performed,  Correct Patient, Correct Site, Correct Laterality,  Correct Procedure, Correct Position, site marked,  Risks and benefits discussed,  Surgical consent,  Pre-op evaluation,  At surgeon's request and post-op pain management ? ?Laterality: Right ? ?Prep: Maximum Sterile Barrier Precautions used, chloraprep     ?  ?Needles:  ?Injection technique: Single-shot ? ?Needle Type: Echogenic Stimulator Needle   ? ? ?Needle Length: 9cm  ?Needle Gauge: 22  ? ? ? ?Additional Needles: ? ? ?Procedures:,,,, ultrasound used (permanent image in chart),,    ?Narrative:  ?Start time: 12/08/2021 7:18 AM ?End time: 12/08/2021 7:21 AM ?Injection made incrementally with aspirations every 5 mL. ? ?Performed by: Personally  ?Anesthesiologist: Freddrick March, MD ? ?Additional Notes: ?Monitors applied. No increased pain on injection. No increased resistance to injection. Injection made in 5cc increments. Good needle visualization. Patient tolerated procedure well.  ? ? ? ? ?

## 2021-12-09 LAB — BASIC METABOLIC PANEL
Anion gap: 9 (ref 5–15)
BUN: 7 mg/dL — ABNORMAL LOW (ref 8–23)
CO2: 27 mmol/L (ref 22–32)
Calcium: 8.6 mg/dL — ABNORMAL LOW (ref 8.9–10.3)
Chloride: 100 mmol/L (ref 98–111)
Creatinine, Ser: 0.5 mg/dL (ref 0.44–1.00)
GFR, Estimated: >60 mL/min (ref >60)
Glucose, Bld: 127 mg/dL — ABNORMAL HIGH (ref 70–99)
Potassium: 3.3 mmol/L — ABNORMAL LOW (ref 3.5–5.1)
Sodium: 136 mmol/L (ref 135–145)

## 2021-12-09 LAB — CBC
HCT: 27.3 % — ABNORMAL LOW (ref 36.0–46.0)
Hemoglobin: 8.5 g/dL — ABNORMAL LOW (ref 12.0–15.0)
MCH: 26.6 pg (ref 26.0–34.0)
MCHC: 31.1 g/dL (ref 30.0–36.0)
MCV: 85.3 fL (ref 80.0–100.0)
Platelets: 772 10*3/uL — ABNORMAL HIGH (ref 150–400)
RBC: 3.2 MIL/uL — ABNORMAL LOW (ref 3.87–5.11)
RDW: 17.7 % — ABNORMAL HIGH (ref 11.5–15.5)
WBC: 13.5 10*3/uL — ABNORMAL HIGH (ref 4.0–10.5)
nRBC: 0 % (ref 0.0–0.2)

## 2021-12-09 LAB — URINE CULTURE: Culture: NO GROWTH

## 2021-12-09 NOTE — Discharge Instructions (Signed)

## 2021-12-09 NOTE — Plan of Care (Signed)

## 2021-12-09 NOTE — NC FL2 (Signed)
?[place] MEDICAID FL2 LEVEL OF CARE SCREENING TOOL  ?  ? ?IDENTIFICATION  ?Patient Name: ?[name] Birthdate: 01/21/1951 Sex: female Admission Date (Current Location): ?12/08/2021  ?[place] and [place] Number: ? [place] ?  Facility and Address:  ?The . [facility], [place] [address], [place], [place] [address] ?     Provider Number: ?[id number]  ?Attending Physician Name and Address:  ?Meredith Pel, MD ? Relative Name and Phone Number:  ?Maylon Peppers Sister [phone] ?   ?Current Level of Care: ?Hospital Recommended Level of Care: ?[place] Prior Approval Number: ?  ? ?Date Approved/Denied: ?  PASRR Number: ?[id number] H ? ?Discharge Plan: ?SNF ?  ? ?Current Diagnoses: ?Patient Active Problem List  ? Diagnosis Date Noted  ? S/P total knee arthroplasty, right 12/08/2021  ? Pressure injury of skin 08/27/2021  ? Fall 08/23/2021  ? Difficulty in walking 08/23/2021  ? Bilateral lower extremity edema 08/23/2021  ? Hormone replacement therapy 08/23/2021  ? Osteoporosis 01/28/2021  ? Osteoarthritis of right wrist 01/28/2021  ? 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  ? Foot pain 09/13/2020  ? Knee pain 08/24/2018  ? Fall in home 07/14/2015  ? TIBIALIS TENDINITIS 08/25/2009  ? CHEST PAIN, OTHER, PAIN 10/21/2008  ? PEDAL EDEMA 07/22/2008  ? ANKLE PAIN, BILATERAL 04/01/2008  ? LOW BACK PAIN 04/01/2008  ? ACUTE BRONCHITIS 09/23/2007  ? SARCOIDOSIS 09/02/2007  ? Asthma 09/02/2007  ? Diverticulosis of colon 07/11/2007  ? BACK PAIN, CHRONIC 07/11/2007  ? ? ?Orientation RESPIRATION BLADDER Height & Weight   ?  ?Self, Time, Situation, Place ? Normal Continent, Indwelling catheter Weight:   ?Height:     ?BEHAVIORAL SYMPTOMS/MOOD NEUROLOGICAL BOWEL NUTRITION STATUS  ?    Continent Diet  ?AMBULATORY STATUS COMMUNICATION OF NEEDS Skin   ?Total Care Verbally Surgical wounds ?  ?  ?  ?    ?     ?     ? ? ?Personal Care Assistance Level of Assistance  ?Bathing, Feeding, Dressing Bathing  Assistance: Maximum assistance ?Feeding assistance: Limited assistance ?Dressing Assistance: Maximum assistance ?   ? ?Functional Limitations Info  ?Sight, Hearing, Speech Sight Info: Adequate ?Hearing Info: Adequate ?Speech Info: Adequate  ? ? ?SPECIAL CARE FACTORS FREQUENCY  ?PT (By licensed PT), OT (By licensed OT)   ?  ?PT Frequency: 5x week ?OT Frequency: 5x week ?  ?  ?  ?   ? ? ?Contractures Contractures Info: Not present  ? ? ?Additional Factors Info  ?Code Status, Allergies Code Status Info: full ?Allergies Info: Ibuprofen, Sulfa Antibiotics ?  ?  ?  ?   ? ?Current Medications (12/09/2021):  This is the current hospital active medication list ?Current Facility-Administered Medications  ?Medication Dose Route Frequency Provider Last Rate Last Admin  ? docusate sodium (COLACE) capsule 100 mg  100 mg Oral BID Magnant, Charles L, PA-C   100 mg at 12/09/21 4492  ? HYDROmorphone (DILAUDID) injection 1 mg  1 mg Intravenous Q4H PRN Magnant, Charles L, PA-C   1 mg at 12/09/21 1402  ? lactated ringers infusion   Intravenous Continuous Magnant, Gerrianne Scale, PA-C   Stopped at 12/09/21 0100  ? menthol-cetylpyridinium (CEPACOL) lozenge 3 mg  1 lozenge Oral PRN Magnant, Charles L, PA-C      ? Or  ? phenol (CHLORASEPTIC) mouth spray 1 spray  1 spray Mouth/Throat PRN Magnant, Charles L, PA-C      ? methocarbamol (ROBAXIN) tablet 500 mg  500 mg Oral Q6H PRN Magnant,  Charles L, PA-C   500 mg at 12/09/21 1239  ? Or  ? methocarbamol (ROBAXIN) 500 mg in dextrose 5 % 50 mL IVPB  500 mg Intravenous Q6H PRN Magnant, Charles L, PA-C      ? oxyCODONE (Oxy IR/ROXICODONE) immediate release tablet 10 mg  10 mg Oral Q4H Magnant, Charles L, PA-C   10 mg at 12/09/21 1017  ? rivaroxaban (XARELTO) tablet 10 mg  10 mg Oral Daily Meredith Pel, MD   10 mg at 12/09/21 1239  ? ? ? ?Discharge Medications: ?Please see discharge summary for a list of discharge medications. ? ?Relevant Imaging Results: ? ?Relevant Lab Results: ? ? ?Additional  Information ?SSN: [id number].  Pt is vaccinated for covid with 2 boosters. ? ?Joanne Chars, LCSW ? ? ? ? ?

## 2021-12-09 NOTE — Progress Notes (Signed)
Physical Therapy Treatment ?Patient Details ?Name: [name] ?MRN: [id number] ?DOB: [birth year] ?Today's Date: 12/09/2021 ? ? ?History of Present Illness 71 y/o female admitted on 12/08/21 following R TKA. PMH: HTN, glaucoma, carpal tunnel, obesity ? ?  ?PT Comments  ? ? Pt making slow progress towards goals, requiring max assist with use of steady to attempt to come to full standing with elevated EOB. Pt unable to generate power through LEs to achieve but able to fully clear hips x1. Pt with good tolerance for LE therex for increased ROM and strength. Pt with good participation and attitude throughout session. Current plan remains appropriate to address deficits and maximize functional independence and decrease caregiver burden.Pt continues to benefit from skilled PT services to progress toward functional mobility goals.  ?  ?Recommendations for follow up therapy are one component of a multi-disciplinary discharge planning process, led by the attending physician.  Recommendations may be updated based on patient status, additional functional criteria and insurance authorization. ? ?Follow Up Recommendations ? Follow physician's recommendations for discharge plan and follow up therapies ?  ?  ?Assistance Recommended at Discharge Frequent or constant Supervision/Assistance  ?Patient can return home with the following Two people to help with walking and/or transfers;A lot of help with bathing/dressing/bathroom;Assistance with cooking/housework;Assist for transportation;Help with stairs or ramp for entrance ?  ?Equipment Recommendations ? Rolling walker (2 wheels);BSC/3in1  ?  ?Recommendations for Other Services   ? ? ?  ?Precautions / Restrictions Precautions ?Precautions: Fall;Knee ?Precaution Booklet Issued: No ?Required Braces or Orthoses: Knee Immobilizer - Right ?Knee Immobilizer - Right: On when out of bed or walking ?Restrictions ?Weight Bearing Restrictions: Yes ?RLE Weight Bearing: Weight bearing as tolerated   ?  ? ?Mobility ? Bed Mobility ?Overal bed mobility: Needs Assistance ?Bed Mobility: Supine to Sit ?  ?  ?Supine to sit: Mod assist ?  ?  ?General bed mobility comments: mod a to bring LEs towards EOB and elevate trunk ?  ? ?Transfers ?Overall transfer level: Needs assistance ?Equipment used: Ambulation equipment used (stedy) ?Transfers: Sit to/from Stand ?Sit to Stand: Max assist, Total assist ?  ?  ?  ?  ?  ?General transfer comment: max-total assist to attempt to come to standing, unable to generate power up to acheieve full upright standing, able to clear hips with use of stedy ?  ? ?Ambulation/Gait ?  ?  ?  ?  ?  ?  ?  ?General Gait Details: unable ? ? ?Stairs ?  ?  ?  ?  ?  ? ? ?Wheelchair Mobility ?  ? ?Modified Rankin (Stroke Patients Only) ?  ? ? ?  ?Balance Overall balance assessment: Needs assistance ?Sitting-balance support: Bilateral upper extremity supported ?Sitting balance-Leahy Scale: Poor ?  ?  ?  ?  ?  ?  ?  ?  ?  ?  ?  ?  ?  ?  ?  ?  ?  ? ?  ?Cognition Arousal/Alertness: Awake/alert ?Behavior During Therapy: [facility] for tasks assessed/performed ?Overall Cognitive Status: Within Functional Limits for tasks assessed ?  ?  ?  ?  ?  ?  ?  ?  ?  ?  ?  ?  ?  ?  ?  ?  ?  ?  ?  ? ?  ?Exercises Total Joint Exercises ?Ankle Circles/Pumps: AROM, Both, 5 reps, Supine ?Gluteal Sets: AROM, 5 reps, 10 reps, Supine ?Heel Slides: AROM, Right, 10 reps, Supine ?Hip ABduction/ADduction: AROM, Right, 10 reps, Supine ? ?  ?  General Comments   ?  ?  ? ?Pertinent Vitals/Pain Pain Assessment ?Pain Assessment: 0-10 ?Pain Score: 5  ?Pain Location: R knee ?Pain Descriptors / Indicators: Guarding, Grimacing, Sharp ?Pain Intervention(s): Limited activity within patient's tolerance, Monitored during session, Premedicated before session, Repositioned  ? ? ?Home Living   ?  ?  ?  ?  ?  ?  ?  ?  ?  ?   ?  ?Prior Function    ?  ?  ?   ? ?PT Goals (current goals can now be found in the care plan section) Acute Rehab PT Goals ?Patient  Stated Goal: to get stronger so I can go home after rehab ?PT Goal Formulation: With patient/family ?Time For Goal Achievement: 12/22/21 ?Potential to Achieve Goals: Fair ? ?  ?Frequency ? ? ? 7X/week ? ? ? ?  ?PT Plan    ? ? ?Co-evaluation   ?  ?  ?  ?  ? ?  ?AM-PAC PT "6 Clicks" Mobility   ?Outcome Measure ? Help needed turning from your back to your side while in a flat bed without using bedrails?: A Lot ?Help needed moving from lying on your back to sitting on the side of a flat bed without using bedrails?: A Lot ?Help needed moving to and from a bed to a chair (including a wheelchair)?: Total ?Help needed standing up from a chair using your arms (e.g., wheelchair or bedside chair)?: Total ?Help needed to walk in hospital room?: Total ?Help needed climbing 3-5 steps with a railing? : Total ?6 Click Score: 8 ? ?  ?End of Session Equipment Utilized During Treatment: Right knee immobilizer;Gait belt ?Activity Tolerance: No increased pain ?Patient left: in bed;with call bell/phone within reach;with bed alarm set ?Nurse Communication: Mobility status ?PT Visit Diagnosis: Unsteadiness on feet (R26.81);Muscle weakness (generalized) (M62.81);Difficulty in walking, not elsewhere classified (R26.2);Pain ?Pain - Right/Left: Right ?Pain - part of body: Knee ?  ? ? ?Time: 4010-2725 ?PT Time Calculation (min) (ACUTE ONLY): 49 min ? ?Charges:  $Gait Training: 23-37 mins ?$Therapeutic Exercise: 8-22 mins          ?          ? ?Audry Riles. PTA ?Acute Rehabilitation Services ?Office: [phone] ? ? ? ?[name] Tylar Amborn ?12/09/2021, 4:07 PM ? ?

## 2021-12-09 NOTE — TOC Initial Note (Signed)
Transition of Care (TOC) - Initial/Assessment Note  ? ? ?Patient Details  ?Name: [name] ?MRN: [id number] ?Date of Birth: 1951/09/03 ? ?Transition of Care [facility]) CM/SW Contact:    ?Joanne Chars, LCSW ?Phone Number: ?12/09/2021, 2:59 PM ? ?Clinical Narrative:  CSW met with pt regarding DC recommendation for SNF. Pt agreeable to this, choice document given, permission given to send out referral in hub. Pt preference would be [facility], pt does not want to go to [facility]. Permission given to speak with sister [name], and with friend [name], [phone].  Pt is vaccinated for covid with 2 boosters. ? ?Referral sent out in hub.                 ? ? ?Expected Discharge Plan: [place] ?Barriers to Discharge: Barriers Resolved ? ? ?Patient Goals and CMS Choice ?Patient states their goals for this hospitalization and ongoing recovery are:: get home ?CMS Medicare.gov Compare Post Acute Care list provided to:: Patient ?Choice offered to / list presented to : Patient ? ?Expected Discharge Plan and Services ?Expected Discharge Plan: [place] ?In-house Referral: Clinical Social Work ?  ?  ?Living arrangements for the past 2 months: [place] ?                ?  ?  ?  ?  ?  ?HH Arranged: PT ?[place] Agency: [facility] ?Date HH Agency Contacted: 12/09/21 ?Time [place]: [phone] ?Representative spoke with at [place]: [place] ? ?Prior Living Arrangements/Services ?Living arrangements for the past 2 months: [place] ?Lives with:: Self ?Patient language and need for interpreter reviewed:: Yes ?Do you feel safe going back to the place where you live?: Yes      ?Need for Family Participation in Patient Care: No (Comment) ?Care giver support system in place?: Yes (comment) ?Current home services: Other (comment) (none) ?Criminal Activity/Legal Involvement Pertinent to Current Situation/Hospitalization: No - Comment as needed ? ?Activities of  Daily Living ?Home Assistive Devices/Equipment: None ?ADL Screening (condition at time of admission) ?Patient's cognitive ability adequate to safely complete daily activities?: Yes ?Is the patient deaf or have difficulty hearing?: No ?Does the patient have difficulty seeing, even when wearing glasses/contacts?: No ?Does the patient have difficulty concentrating, remembering, or making decisions?: No ?Patient able to express need for assistance with ADLs?: Yes ?Does the patient have difficulty dressing or bathing?: Yes ?Independently performs ADLs?: No ?Communication: Independent ?Does the patient have difficulty walking or climbing stairs?: Yes ?Weakness of Legs: Right ?Weakness of Arms/Hands: None ? ?Permission Sought/Granted ?Permission sought to share information with : Family Supports ?Permission granted to share information with : Yes, Verbal Permission Granted ? Share Information with NAME: sister [name] friend [name], [phone] ? Permission granted to share info w AGENCY: SNF ?   ?   ? ?Emotional Assessment ?Appearance:: Appears stated age ?Attitude/Demeanor/Rapport: Engaged ?Affect (typically observed): Appropriate, Pleasant ?Orientation: : Oriented to Self, Oriented to Place, Oriented to  Time, Oriented to Situation ?Alcohol / Substance Use: Not Applicable ?Psych Involvement: No (comment) ? ?Admission diagnosis:  S/P total knee arthroplasty, right [Z96.651] ?Patient Active Problem List  ? Diagnosis Date Noted  ? S/P total knee arthroplasty, right 12/08/2021  ? Pressure injury of skin 08/27/2021  ? Fall 08/23/2021  ? Difficulty in walking 08/23/2021  ? Bilateral lower extremity edema 08/23/2021  ? Hormone replacement therapy 08/23/2021  ? Osteoporosis 01/28/2021  ? Osteoarthritis of right wrist 01/28/2021  ? Pincer  nail deformity 09/13/2020  ? Foot pain 09/13/2020  ? Knee pain 08/24/2018  ? Fall in home 07/14/2015  ? TIBIALIS TENDINITIS 08/25/2009  ? CHEST PAIN, OTHER, PAIN 10/21/2008  ? PEDAL  EDEMA 07/22/2008  ? ANKLE PAIN, BILATERAL 04/01/2008  ? LOW BACK PAIN 04/01/2008  ? ACUTE BRONCHITIS 09/23/2007  ? SARCOIDOSIS 09/02/2007  ? Asthma 09/02/2007  ? Diverticulosis of colon 07/11/2007  ? BACK PAIN, CHRONIC 07/11/2007  ? ?PCP:  [name], MD ?Pharmacy:   ?[id number] [place] [facility] [id number]?Phone: [phone]: [phone]? ? ? ? ?Social Determinants of Health (SDOH) Interventions ?  ? ?Readmission Risk Interventions ?No flowsheet data found. ? ? ?

## 2021-12-09 NOTE — Progress Notes (Signed)
Physical Therapy Treatment ?Patient Details ?Name: [name] ?MRN: [id number] ?DOB: [birth year] ?Today's Date: 12/09/2021 ? ? ?History of Present Illness 71 y/o female admitted on 12/08/21 following R TKA. PMH: HTN, glaucoma, carpal tunnel, obesity ? ?  ?PT Comments  ? ? AM session: Pt received supine and agreeable to session requiring up to mod assist to come to sitting EOB. Pt requiring max-total assist to attempt to come to standing with progressively elevated EOB with pt unable to achieve full standing. Pt able to minimally clear hips with attempts to max fatigue. Pt agreeable to CPM at end of session. Pt educated on knee immobilizer, bone foam, and CPM. Pt verbalized understanding. Continue progression of functional mobility in PM. Current plan remains appropriate to address deficits and maximize functional independence and decrease caregiver burden. Pt continues to benefit from skilled PT services to progress toward functional mobility goals.  ?  ?Recommendations for follow up therapy are one component of a multi-disciplinary discharge planning process, led by the attending physician.  Recommendations may be updated based on patient status, additional functional criteria and insurance authorization. ? ?Follow Up Recommendations ? Follow physician's recommendations for discharge plan and follow up therapies ?  ?  ?Assistance Recommended at Discharge Frequent or constant Supervision/Assistance  ?Patient can return home with the following Two people to help with walking and/or transfers;A lot of help with bathing/dressing/bathroom;Assistance with cooking/housework;Assist for transportation;Help with stairs or ramp for entrance ?  ?Equipment Recommendations ? Rolling walker (2 wheels);BSC/3in1  ?  ?Recommendations for Other Services   ? ? ?  ?Precautions / Restrictions Precautions ?Precautions: Fall;Knee ?Precaution Booklet Issued: No ?Required Braces or Orthoses: Knee Immobilizer - Right ?Knee Immobilizer -  Right: On when out of bed or walking ?Restrictions ?Weight Bearing Restrictions: Yes ?RLE Weight Bearing: Weight bearing as tolerated  ?  ? ?Mobility ? Bed Mobility ?Overal bed mobility: Needs Assistance ?Bed Mobility: Supine to Sit ?  ?  ?Supine to sit: Mod assist ?  ?  ?General bed mobility comments: mod a to bring LEs towards EOB and elevate trunk ?  ? ?Transfers ?Overall transfer level: Needs assistance ?Equipment used: Rolling walker (2 wheels) ?Transfers: Sit to/from Stand ?Sit to Stand: Max assist, Total assist ?  ?  ?  ?  ?  ?General transfer comment: max-total assist to attempt to come to standing, unable to generate power up to acheieve ?  ? ?Ambulation/Gait ?  ?  ?  ?  ?  ?  ?  ?General Gait Details: unable ? ? ?Stairs ?  ?  ?  ?  ?  ? ? ?Wheelchair Mobility ?  ? ?Modified Rankin (Stroke Patients Only) ?  ? ? ?  ?Balance Overall balance assessment: Needs assistance ?Sitting-balance support: Bilateral upper extremity supported ?Sitting balance-Leahy Scale: Poor ?  ?  ?  ?  ?  ?  ?  ?  ?  ?  ?  ?  ?  ?  ?  ?  ?  ? ?  ?Cognition Arousal/Alertness: Awake/alert ?Behavior During Therapy: [facility] for tasks assessed/performed ?Overall Cognitive Status: Within Functional Limits for tasks assessed ?  ?  ?  ?  ?  ?  ?  ?  ?  ?  ?  ?  ?  ?  ?  ?  ?  ?  ?  ? ?  ?Exercises   ? ?  ?General Comments General comments (skin integrity, edema, etc.): Educated patient on CPM, knee immobilizer, ankle pumps and bone  foam ?  ?  ? ?Pertinent Vitals/Pain Pain Assessment ?Pain Assessment: 0-10 ?Pain Score: 5  ?Pain Location: R knee ?Pain Descriptors / Indicators: Guarding, Grimacing, Sharp ?Pain Intervention(s): Limited activity within patient's tolerance, Monitored during session, Premedicated before session, Repositioned  ? ? ?Home Living   ?  ?  ?  ?  ?  ?  ?  ?  ?  ?   ?  ?Prior Function    ?  ?  ?   ? ?PT Goals (current goals can now be found in the care plan section) Acute Rehab PT Goals ?Patient Stated Goal: to get stronger so I  can go home after rehab ?PT Goal Formulation: With patient/family ?Time For Goal Achievement: 12/22/21 ?Potential to Achieve Goals: Fair ? ?  ?Frequency ? ? ? 7X/week ? ? ? ?  ?PT Plan    ? ? ?Co-evaluation   ?  ?  ?  ?  ? ?  ?AM-PAC PT "6 Clicks" Mobility   ?Outcome Measure ? Help needed turning from your back to your side while in a flat bed without using bedrails?: A Lot ?Help needed moving from lying on your back to sitting on the side of a flat bed without using bedrails?: A Lot ?Help needed moving to and from a bed to a chair (including a wheelchair)?: Total ?Help needed standing up from a chair using your arms (e.g., wheelchair or bedside chair)?: Total ?Help needed to walk in hospital room?: Total ?Help needed climbing 3-5 steps with a railing? : Total ?6 Click Score: 8 ? ?  ?End of Session Equipment Utilized During Treatment: Right knee immobilizer;Gait belt ?Activity Tolerance: No increased pain ?Patient left: in bed;with call bell/phone within reach;with bed alarm set ?Nurse Communication: Mobility status ?PT Visit Diagnosis: Unsteadiness on feet (R26.81);Muscle weakness (generalized) (M62.81);Difficulty in walking, not elsewhere classified (R26.2);Pain ?Pain - Right/Left: Right ?Pain - part of body: Knee ?  ? ? ?Time: 1019-1100 ?PT Time Calculation (min) (ACUTE ONLY): 41 min ? ?Charges:  $Gait Training: 23-37 mins ?$Therapeutic Activity: 8-22 mins          ?          ? ?[name]. PTA ?Acute Rehabilitation Services ?Office: [phone] ? ? ? ?[name] [name] ?12/09/2021, 12:09 PM ? ?

## 2021-12-09 NOTE — Progress Notes (Signed)
The patient is injury-free, afebrile, alert, and oriented X 3. Vital signs were within the baseline during this shift. She complained of RLE pain, which is is under control with the current pain regimen. Neuro checks unremarkable. Pt denies chest pain, SOB, nausea, vomiting, dizziness, signs or symptoms of bleeding or infection, or acute changes during this shift. We will continue to monitor and work toward achieving the care plan goals. ?

## 2021-12-09 NOTE — Progress Notes (Signed)
?  Subjective: ?Patient stable.  She is on the CPM at this time.  Pain control better today than it was yesterday.  She is currently on oxycodone short acting and long-acting.  ? ?Objective: ?Vital signs in last 24 hours: ?Temp:  [97.2 ?F (36.2 ?C)-98.6 ?F (37 ?C)] 98.2 ?F (36.8 ?C) (03/10 9622) ?Pulse Rate:  [84-102] 102 (03/10 0824) ?Resp:  [12-35] 18 (03/10 0824) ?BP: (149-177)/(71-106) 149/71 (03/10 0824) ?SpO2:  [92 %-100 %] 98 % (03/10 0824) ? ?Intake/Output from previous day: ?03/09 0701 - 03/10 0700 ?In: 50 [P.O.:200; I.V.:1700] ?Out: 1050 [Urine:1000; Blood:50] ?Intake/Output this shift: ?No intake/output data recorded. ? ?Exam: ? ?Dorsiflexion/Plantar flexion intact ?No cellulitis present ? ?Labs: ?Recent Labs  ?  12/06/21 ?1333 12/09/21 ?0252  ?HGB 9.6* 8.5*  ? ?Recent Labs  ?  12/06/21 ?1333 12/09/21 ?0252  ?WBC 13.0* 13.5*  ?RBC 3.64* 3.20*  ?HCT 31.8* 27.3*  ?PLT 665* 772*  ? ?Recent Labs  ?  12/06/21 ?1333 12/09/21 ?0252  ?NA 136 136  ?K 4.0 3.3*  ?CL 100 100  ?CO2 24 27  ?BUN 8 7*  ?CREATININE 0.46 0.50  ?GLUCOSE 117* 127*  ?CALCIUM 9.0 8.6*  ? ?No results for input(s): LABPT, INR in the last 72 hours. ? ?Assessment/Plan: ?Plan at this time is continue with physical therapy.  Anticipate discharge to skilled nursing on Monday.  We will change her over from incisional wound VAC to Aquacel dressing at that time ? ? ?Anderson Malta ?12/09/2021, 12:10 PM  ? ? ?  ?

## 2021-12-09 NOTE — Progress Notes (Signed)
Mobility Specialist: Progress Note ? ? 12/09/21 1701  ?Mobility  ?Activity Dangled on edge of bed  ?Level of Assistance Minimal assist, patient does 75% or more  ?Assistive Device None  ?RLE Weight Bearing WBAT  ?Activity Response Tolerated fair  ?$Mobility charge 1 Mobility  ? ?Pt received in bed and agreeable to mobility. Pt assisted to EOB requiring physical assistance to progress BLE to EOB as well as to sit up. Once sitting pt c/o 7/10 pain in her R knee, otherwise asymptomatic. Pt assisted back in bed with call bell and phone at her side.  ? ?Harrell Gave [name] ?Mobility Specialist ?Mobility Specialist [place]: [phone] ?Mobility Specialist [place]: [phone] ? ?

## 2021-12-10 LAB — CBC
HCT: 26.7 % — ABNORMAL LOW (ref 36.0–46.0)
Hemoglobin: 8.4 g/dL — ABNORMAL LOW (ref 12.0–15.0)
MCH: 26.5 pg (ref 26.0–34.0)
MCHC: 31.5 g/dL (ref 30.0–36.0)
MCV: 84.2 fL (ref 80.0–100.0)
Platelets: 741 10*3/uL — ABNORMAL HIGH (ref 150–400)
RBC: 3.17 MIL/uL — ABNORMAL LOW (ref 3.87–5.11)
RDW: 18 % — ABNORMAL HIGH (ref 11.5–15.5)
WBC: 15.5 10*3/uL — ABNORMAL HIGH (ref 4.0–10.5)
nRBC: 0 % (ref 0.0–0.2)

## 2021-12-10 LAB — PREALBUMIN: Prealbumin: 8.8 mg/dL — ABNORMAL LOW (ref 18–38)

## 2021-12-10 LAB — ALBUMIN: Albumin: 2.1 g/dL — ABNORMAL LOW (ref 3.5–5.0)

## 2021-12-10 MED ORDER — OXYCODONE HCL ER 15 MG PO T12A
15.0000 mg | EXTENDED_RELEASE_TABLET | Freq: Two times a day (BID) | ORAL | Status: DC
  Administered 2021-12-10 – 2021-12-14 (×10): 15 mg via ORAL
  Filled 2021-12-10 (×11): qty 1

## 2021-12-10 MED ORDER — ACETAMINOPHEN 500 MG PO TABS
1000.0000 mg | ORAL_TABLET | Freq: Three times a day (TID) | ORAL | Status: DC | PRN
  Administered 2021-12-10 – 2021-12-11 (×4): 1000 mg via ORAL
  Filled 2021-12-10 (×4): qty 2

## 2021-12-10 NOTE — Plan of Care (Signed)
?  Problem: Education: ?Goal: Knowledge of General Education information will improve ?Description: Including pain rating scale, medication(s)/side effects and non-pharmacologic comfort measures ?Outcome: Progressing ?  ?Problem: Health Behavior/Discharge Planning: ?Goal: Ability to manage health-related needs will improve ?Outcome: Progressing ?  ?Problem: Clinical Measurements: ?Goal: Ability to maintain clinical measurements within normal limits will improve ?Outcome: Progressing ?Goal: Will remain free from infection ?Outcome: Progressing ?Goal: Diagnostic test results will improve ?Outcome: Progressing ?Goal: Respiratory complications will improve ?Outcome: Progressing ?Goal: Cardiovascular complication will be avoided ?Outcome: Progressing ?  ?Problem: Clinical Measurements: ?Goal: Will remain free from infection ?Outcome: Progressing ?  ?Problem: Clinical Measurements: ?Goal: Diagnostic test results will improve ?Outcome: Progressing ?  ?Problem: Clinical Measurements: ?Goal: Respiratory complications will improve ?Outcome: Progressing ?  ?Problem: Clinical Measurements: ?Goal: Cardiovascular complication will be avoided ?Outcome: Progressing ?  ?Problem: Activity: ?Goal: Risk for activity intolerance will decrease ?Outcome: Progressing ?  ?Problem: Nutrition: ?Goal: Adequate nutrition will be maintained ?Outcome: Progressing ?  ?

## 2021-12-10 NOTE — Progress Notes (Signed)
Mobility Specialist: Progress Note ? ? 12/10/21 1802  ?Mobility  ?Activity Stood at bedside  ?Level of Assistance Maximum assist, patient does 25-49%  ?Assistive Device Stedy  ?RLE Weight Bearing WBAT  ?Activity Response Tolerated poorly  ?$Mobility charge 1 Mobility  ? ?Attempted standing trial with pt. Unable to fully stand or clear hips this session. Pt requires extended time to sit EOB and with standing d/t pain level. Pt back in bed with call bell and phone at her side.  ? ?[name] ?Mobility Specialist ?Mobility Specialist [place]: [phone] ?Mobility Specialist [place]: [phone] ? ?

## 2021-12-10 NOTE — Progress Notes (Addendum)
Physical Therapy Treatment ?Patient Details ?Name: [name] ?MRN: [id number] ?DOB: [birth year] ?Today's Date: 12/10/2021 ? ? ?History of Present Illness 71 y/o female admitted on 12/08/21 following R TKA. PMH: HTN, glaucoma, carpal tunnel, obesity ? ?  ?PT Comments  ? ? Patient performed exercises this afternoon.  Limited by weakness and decreased ROM.     ?Recommendations for follow up therapy are one component of a multi-disciplinary discharge planning process, led by the attending physician.  Recommendations may be updated based on patient status, additional functional criteria and insurance authorization. ? ?Follow Up Recommendations ? Follow physician's recommendations for discharge plan and follow up therapies ?  ?  ?Assistance Recommended at Discharge Frequent or constant Supervision/Assistance  ?Patient can return home with the following Two people to help with walking and/or transfers;A lot of help with bathing/dressing/bathroom;Assistance with cooking/housework;Assist for transportation;Help with stairs or ramp for entrance ?  ?Equipment Recommendations ? Rolling walker (2 wheels);BSC/3in1  ?  ?Recommendations for Other Services   ? ? ?  ?Precautions / Restrictions Precautions ?Precautions: Fall;Knee ?Required Braces or Orthoses: Knee Immobilizer - Right ?Knee Immobilizer - Right: On when out of bed or walking ?Restrictions ?RLE Weight Bearing: Weight bearing as tolerated  ?  ? ?Mobility ? Bed Mobility ?Overal bed mobility: Needs Assistance ?Bed Mobility: Supine to Sit ?  ?  ?Supine to sit: Mod assist, +2 for physical assistance ?  ?  ?General bed mobility comments: mod a to bring LEs towards EOB and elevate trunk ?  ? ?Transfers ?Overall transfer level: Needs assistance ?Equipment used: Ambulation equipment used (steady) ?Transfers: Sit to/from Stand ?Sit to Stand: Total assist, +2 physical assistance ?  ?  ?  ?  ?  ?General transfer comment: unable to reach full upright but was able to clear hips of  bed with raised bed and patient pulling on stedy. ?  ? ?Ambulation/Gait ?  ?  ?  ?  ?  ?  ?  ?General Gait Details: unable ? ? ?Stairs ?  ?  ?  ?  ?  ? ? ?Wheelchair Mobility ?  ? ?Modified Rankin (Stroke Patients Only) ?  ? ? ?  ?Balance Overall balance assessment: Needs assistance ?Sitting-balance support: No upper extremity supported, Feet supported ?Sitting balance-Leahy Scale: Good ?  ?  ?  ?  ?  ?  ?  ?  ?  ?  ?  ?  ?  ?  ?  ?  ?  ? ?  ?Cognition Arousal/Alertness: Awake/alert ?Behavior During Therapy: [facility] for tasks assessed/performed ?Overall Cognitive Status: Within Functional Limits for tasks assessed ?  ?  ?  ?  ?  ?  ?  ?  ?  ?  ?  ?  ?  ?  ?  ?  ?  ?  ?  ? ?  ?Exercises Total Joint Exercises ?Ankle Circles/Pumps: AROM, Both, Supine, 10 reps ?Quad Sets: AROM, Both, 10 reps, Supine ?Gluteal Sets: AROM, 10 reps, Supine, Both ?Short Arc Quad: AAROM, Right, 10 reps, Supine ?Heel Slides: Right, 10 reps, Supine, AAROM ?Hip ABduction/ADduction: Right, 10 reps, Supine, AAROM ? ?  ?General Comments   ?  ?  ? ?Pertinent Vitals/Pain Pain Assessment ?Pain Assessment: 0-10 ?Pain Score: 7  ?Pain Location: Bil knees ?Pain Descriptors / Indicators: Guarding, Grimacing, Sharp ?Pain Intervention(s): Limited activity within patient's tolerance, Monitored during session, Premedicated before session, Utilized relaxation techniques  ? ? ?Home Living   ?  ?  ?  ?  ?  ?  ?  ?  ?  ?   ?  ?  Prior Function    ?  ?  ?   ? ?PT Goals (current goals can now be found in the care plan section) Progress towards PT goals: Progressing toward goals ? ?  ?Frequency ? ? ? 7X/week ? ? ? ?  ?PT Plan Current plan remains appropriate  ? ? ?Co-evaluation   ?  ?  ?  ?  ? ?  ?AM-PAC PT "6 Clicks" Mobility   ?Outcome Measure ? Help needed turning from your back to your side while in a flat bed without using bedrails?: A Lot ?Help needed moving from lying on your back to sitting on the side of a flat bed without using bedrails?: A Lot ?Help needed  moving to and from a bed to a chair (including a wheelchair)?: Total ?Help needed standing up from a chair using your arms (e.g., wheelchair or bedside chair)?: Total ?Help needed to walk in hospital room?: Total ?Help needed climbing 3-5 steps with a railing? : Total ?6 Click Score: 8 ? ?  ?End of Session Equipment Utilized During Treatment: Gait belt ?Activity Tolerance: Patient limited by pain;Patient limited by fatigue ?Patient left: in bed;with call bell/phone within reach ?  ?PT Visit Diagnosis: Unsteadiness on feet (R26.81);Muscle weakness (generalized) (M62.81);Difficulty in walking, not elsewhere classified (R26.2);Pain ?Pain - Right/Left: Right ?Pain - part of body: Knee ?  ? ? ?Time: 6270-3500 ?PT Time Calculation (min) (ACUTE ONLY): 20 min ? ?Charges:  $Therapeutic Exercise: 8-22 mins ?         ?          ? ?12/10/2021 ?Lesleigh Noe, PT ?Acute Rehabilitation Services ?Pager:  [phone] ?Office:  [phone] ? ? ? ?Shanna Cisco ?12/10/2021, 3:42 PM ? ?

## 2021-12-10 NOTE — Progress Notes (Signed)
?  Subjective: ?Patient stable.  Pain slightly better controlled.  Was able to ambulate in the room yesterday with physical therapy.  Still somewhat slow to mobilize due to deconditioning  ? ?Objective: ?Vital signs in last 24 hours: ?Temp:  [98.4 ?F (36.9 ?C)-99.9 ?F (37.7 ?C)] 98.9 ?F (37.2 ?C) (03/11 0426) ?Pulse Rate:  [106-123] 115 (03/11 0426) ?Resp:  [14-18] 16 (03/11 0426) ?BP: (121-158)/(69-85) 143/70 (03/11 0426) ?SpO2:  [95 %-97 %] 95 % (03/11 0223) ? ?Intake/Output from previous day: ?No intake/output data recorded. ?Intake/Output this shift: ?No intake/output data recorded. ? ?Exam: ? ?Sensation intact distally ?Intact pulses distally ?Dorsiflexion/Plantar flexion intact ? ?Labs: ?Recent Labs  ?  12/09/21 ?0252 12/10/21 ?0255  ?HGB 8.5* 8.4*  ? ?Recent Labs  ?  12/09/21 ?0252 12/10/21 ?0255  ?WBC 13.5* 15.5*  ?RBC 3.20* 3.17*  ?HCT 27.3* 26.7*  ?PLT 772* 741*  ? ?Recent Labs  ?  12/09/21 ?0252  ?NA 136  ?K 3.3*  ?CL 100  ?CO2 27  ?BUN 7*  ?CREATININE 0.50  ?GLUCOSE 127*  ?CALCIUM 8.6*  ? ?No results for input(s): LABPT, INR in the last 72 hours. ? ?Assessment/Plan: ?Plan at this time is to continue Xarelto for DVT prophylaxis.  Plan to discharge to skilled nursing on Monday.  Patient is on long-acting and short acting oxycodone for pain.  We did discuss a tapering schedule over the next 4 weeks.  Ultrasound Monday to rule out DVT and if negative we will change her to aspirin for DVT prophylaxis.  Continue with CPM machine 1 hour 3 times a day as well as working on full extension about 15 minutes 3 times a day minimum ? ? ?G Sales promotion account executive ?12/10/2021, 8:42 AM  ? ? ?  ?

## 2021-12-10 NOTE — Progress Notes (Signed)
?   12/10/21 0223  ?Assess: MEWS Score  ?Temp 98.7 ?F (37.1 ?C)  ?BP 121/69  ?Pulse Rate (!) 123  ?Resp 14  ?Level of Consciousness Alert  ?SpO2 95 %  ?Assess: MEWS Score  ?MEWS Temp 0  ?MEWS Systolic 0  ?MEWS Pulse 2  ?MEWS RR 0  ?MEWS LOC 0  ?MEWS Score 2  ?MEWS Score Color Yellow  ?Assess: if the MEWS score is Yellow or Red  ?Were vital signs taken at a resting state? Yes  ?Focused Assessment No change from prior assessment  ?Early Detection of Sepsis Score *See Row Information* Low  ?MEWS guidelines implemented *See Row Information* No, previously yellow, continue vital signs every 4 hours  ?Treat  ?Pain Scale 0-10  ?Pain Score Asleep  ?Take Vital Signs  ?Increase Vital Sign Frequency  Yellow: Q 2hr X 2 then Q 4hr X 2, if remains yellow, continue Q 4hrs  ?Document  ?Progress note created (see row info) Yes  ? ? ?

## 2021-12-10 NOTE — Progress Notes (Signed)
?   12/10/21 0007  ?Assess: MEWS Score  ?Temp 99.9 ?F (37.7 ?C)  ?BP (!) 149/85  ?Pulse Rate (!) 123  ?Resp 18  ?SpO2 97 %  ?Assess: MEWS Score  ?MEWS Temp 0  ?MEWS Systolic 0  ?MEWS Pulse 2  ?MEWS RR 0  ?MEWS LOC 0  ?MEWS Score 2  ?MEWS Score Color Yellow  ?Assess: if the MEWS score is Yellow or Red  ?Were vital signs taken at a resting state? Yes  ?Focused Assessment Change from prior assessment (see assessment flowsheet)  ?Early Detection of Sepsis Score *See Row Information* Medium  ?MEWS guidelines implemented *See Row Information* Yes  ?Treat  ?MEWS Interventions Administered prn meds/treatments;Other (Comment)  ?Pain Scale 0-10  ?Pain Score 2  ?Take Vital Signs  ?Increase Vital Sign Frequency  Yellow: Q 2hr X 2 then Q 4hr X 2, if remains yellow, continue Q 4hrs  ?Notify: Provider  ?Provider Name/Title Dr. Sammuel Hines  ?Date Provider Notified 12/10/21  ?Time Provider Notified [phone]  ?Notification Type Call  ?Notification Reason Change in status  ?Provider response See new orders  ?Date of Provider Response 12/10/21  ?Time of Provider Response [phone]  ?Document  ?Progress note created (see row info) Yes  ? ? ?

## 2021-12-10 NOTE — Progress Notes (Signed)
Noted elevated HR and temp- call placed to on call phys-orders recieved ?

## 2021-12-10 NOTE — Progress Notes (Signed)
Notified Luke via phone regarding fever 100 over last 2 hours with IS use and tylenol.  Discussed pain management.  No new orders.  ?

## 2021-12-10 NOTE — Progress Notes (Signed)
Physical Therapy Treatment ?Patient Details ?Name: [name] ?MRN: [id number] ?DOB: Nov 18, 1950 ?Today's Date: 12/10/2021 ? ? ?History of Present Illness 71 y/o female admitted on 12/08/21 following R TKA. PMH: HTN, glaucoma, carpal tunnel, obesity ? ?  ?PT Comments  ? ? Patient slowly progressing.  Still unable to reach full upright due to pain and generalized weakness. Appears plan is for d/c to SNF where she can receive continued PT.     ?Recommendations for follow up therapy are one component of a multi-disciplinary discharge planning process, led by the attending physician.  Recommendations may be updated based on patient status, additional functional criteria and insurance authorization. ? ?Follow Up Recommendations ? Follow physician's recommendations for discharge plan and follow up therapies ?  ?  ?Assistance Recommended at Discharge Frequent or constant Supervision/Assistance  ?Patient can return home with the following Two people to help with walking and/or transfers;A lot of help with bathing/dressing/bathroom;Assistance with cooking/housework;Assist for transportation;Help with stairs or ramp for entrance ?  ?Equipment Recommendations ? Rolling walker (2 wheels);BSC/3in1  ?  ?Recommendations for Other Services   ? ? ?  ?Precautions / Restrictions Precautions ?Precautions: Fall;Knee ?Required Braces or Orthoses: Knee Immobilizer - Right ?Knee Immobilizer - Right: On when out of bed or walking ?Restrictions ?RLE Weight Bearing: Weight bearing as tolerated  ?  ? ?Mobility ? Bed Mobility ?Overal bed mobility: Needs Assistance ?Bed Mobility: Supine to Sit ?  ?  ?Supine to sit: Mod assist, +2 for physical assistance ?  ?  ?General bed mobility comments: mod a to bring LEs towards EOB and elevate trunk ?  ? ?Transfers ?Overall transfer level: Needs assistance ?Equipment used: Ambulation equipment used (steady) ?Transfers: Sit to/from Stand ?Sit to Stand: Total assist, +2 physical assistance ?  ?  ?  ?  ?   ?General transfer comment: unable to reach full upright but was able to clear hips of bed with raised bed and patient pulling on stedy. ?  ? ?Ambulation/Gait ?  ?  ?  ?  ?  ?  ?  ?General Gait Details: unable ? ? ?Stairs ?  ?  ?  ?  ?  ? ? ?Wheelchair Mobility ?  ? ?Modified Rankin (Stroke Patients Only) ?  ? ? ?  ?Balance Overall balance assessment: Needs assistance ?Sitting-balance support: No upper extremity supported, Feet supported ?Sitting balance-Leahy Scale: Good ?  ?  ?  ?  ?  ?  ?  ?  ?  ?  ?  ?  ?  ?  ?  ?  ?  ? ?  ?Cognition Arousal/Alertness: Awake/alert ?Behavior During Therapy: [facility] for tasks assessed/performed ?Overall Cognitive Status: Within Functional Limits for tasks assessed ?  ?  ?  ?  ?  ?  ?  ?  ?  ?  ?  ?  ?  ?  ?  ?  ?  ?  ?  ? ?  ?Exercises   ? ?  ?General Comments   ?  ?  ? ?Pertinent Vitals/Pain Pain Assessment ?Pain Assessment: 0-10 ?Pain Score: 7  ?Pain Location: Bil knees ?Pain Descriptors / Indicators: Guarding, Grimacing, Sharp ?Pain Intervention(s): Limited activity within patient's tolerance, Monitored during session, Premedicated before session, Utilized relaxation techniques  ? ? ?Home Living   ?  ?  ?  ?  ?  ?  ?  ?  ?  ?   ?  ?Prior Function    ?  ?  ?   ? ?  PT Goals (current goals can now be found in the care plan section) Progress towards PT goals: Progressing toward goals ? ?  ?Frequency ? ? ? 7X/week ? ? ? ?  ?PT Plan Current plan remains appropriate  ? ? ?Co-evaluation   ?  ?  ?  ?  ? ?  ?AM-PAC PT "6 Clicks" Mobility   ?Outcome Measure ? Help needed turning from your back to your side while in a flat bed without using bedrails?: A Lot ?Help needed moving from lying on your back to sitting on the side of a flat bed without using bedrails?: A Lot ?Help needed moving to and from a bed to a chair (including a wheelchair)?: Total ?Help needed standing up from a chair using your arms (e.g., wheelchair or bedside chair)?: Total ?Help needed to walk in hospital room?: Total ?Help  needed climbing 3-5 steps with a railing? : Total ?6 Click Score: 8 ? ?  ?End of Session Equipment Utilized During Treatment: Gait belt ?Activity Tolerance: Patient limited by pain;Patient limited by fatigue ?Patient left: in bed;with call bell/phone within reach ?  ?PT Visit Diagnosis: Unsteadiness on feet (R26.81);Muscle weakness (generalized) (M62.81);Difficulty in walking, not elsewhere classified (R26.2);Pain ?Pain - Right/Left: Right ?Pain - part of body: Knee ?  ? ? ?Time: 4742-5956 ?PT Time Calculation (min) (ACUTE ONLY): 37 min ? ?Charges:  $Therapeutic Activity: 23-37 mins          ?          ? ?12/10/2021 ?Lesleigh Noe, PT ?Acute Rehabilitation Services ?Pager:  [phone] ?Office:  [phone] ? ? ? ?Shanna Cisco ?12/10/2021, 1:18 PM ? ?

## 2021-12-11 ENCOUNTER — Inpatient Hospital Stay (HOSPITAL_COMMUNITY): Payer: Medicare PPO

## 2021-12-11 DIAGNOSIS — R609 Edema, unspecified: Secondary | ICD-10-CM

## 2021-12-11 MED ORDER — ALBUTEROL SULFATE HFA 108 (90 BASE) MCG/ACT IN AERS: 2.0000 | INHALATION_SPRAY | Freq: Four times a day (QID) | RESPIRATORY_TRACT | Status: DC | PRN

## 2021-12-11 MED ORDER — MOMETASONE FURO-FORMOTEROL FUM 200-5 MCG/ACT IN AERO
2.0000 | INHALATION_SPRAY | Freq: Two times a day (BID) | RESPIRATORY_TRACT | Status: DC
  Administered 2021-12-11 – 2021-12-14 (×7): 2 via RESPIRATORY_TRACT
  Filled 2021-12-11: qty 8.8

## 2021-12-11 MED ORDER — ALBUTEROL SULFATE (2.5 MG/3ML) 0.083% IN NEBU
2.5000 mg | INHALATION_SOLUTION | Freq: Four times a day (QID) | RESPIRATORY_TRACT | Status: DC | PRN
  Administered 2021-12-11: 2.5 mg via RESPIRATORY_TRACT
  Filled 2021-12-11: qty 3

## 2021-12-11 NOTE — Progress Notes (Signed)
Lower extremity venous bilateral study completed.   Please see CV Proc for preliminary results.   Chattie Greeson, RDMS, RVT  

## 2021-12-11 NOTE — Progress Notes (Signed)
Physical Therapy Treatment ?Patient Details ?Name: [name] ?MRN: [id number] ?DOB: 1951/05/12 ?Today's Date: 12/11/2021 ? ? ?History of Present Illness 71 y/o female admitted on 12/08/21 following R TKA. PMH: HTN, glaucoma, carpal tunnel, obesity ? ?  ?PT Comments  ? ? Extensive training for bed mobility and attempts with sit<>stand transfers. Pt progressing slowly but very motivated. Pain better controlled this afternoon however her functional progress is limited by left knee issues as well. Placed in CPM at 1545 (10-40 degrees). Pt Rt knee ROM 7-75 deg at EOB. Patient will continue to benefit from skilled physical therapy services to further improve independence with functional mobility. ?  ?Recommendations for follow up therapy are one component of a multi-disciplinary discharge planning process, led by the attending physician.  Recommendations may be updated based on patient status, additional functional criteria and insurance authorization. ? ?Follow Up Recommendations ? Follow physician's recommendations for discharge plan and follow up therapies ?  ?  ?Assistance Recommended at Discharge Frequent or constant Supervision/Assistance  ?Patient can return home with the following Two people to help with walking and/or transfers;A lot of help with bathing/dressing/bathroom;Assistance with cooking/housework;Assist for transportation;Help with stairs or ramp for entrance ?  ?Equipment Recommendations ? Rolling walker (2 wheels);BSC/3in1  ?  ?Recommendations for Other Services   ? ? ?  ?Precautions / Restrictions Precautions ?Precautions: Fall;Knee ?Precaution Booklet Issued: No ?Required Braces or Orthoses: Knee Immobilizer - Right ?Knee Immobilizer - Right: On when out of bed or walking ?Restrictions ?Weight Bearing Restrictions: Yes ?RLE Weight Bearing: Weight bearing as tolerated  ?  ? ?Mobility ? Bed Mobility ?Overal bed mobility: Needs Assistance ?Bed Mobility: Supine to Sit, Sit to Supine ?  ?  ?Supine to  sit: Mod assist, HOB elevated ?Sit to supine: Mod assist ?  ?General bed mobility comments: Mod assist to scoot LEs to EOB, educated on use of gait belt to support RLE, cues for technique use of bed rail as needed. Mod assist for LEs back into bed. Practiced briding and pulling through head board rail to scoot up in bed. ?  ? ?Transfers ?Overall transfer level: Needs assistance ?Equipment used:  (Using back of chair for support) ?Transfers: Sit to/from Stand ?Sit to Stand: Total assist, From elevated surface ?  ?  ?  ?  ?  ?General transfer comment: Performed sit<>stand x 2, having difficulty fully extending hips and knees to obtain upright position. Cues for technique. Pt providing some effort however LEs unable to produce meaningful amount of force to fully straighten knees and stand. ?  ? ?Ambulation/Gait ?  ?  ?  ?  ?  ?  ?  ?  ? ? ?Stairs ?  ?  ?  ?  ?  ? ? ?Wheelchair Mobility ?  ? ?Modified Rankin (Stroke Patients Only) ?  ? ? ?  ?Balance Overall balance assessment: Needs assistance ?Sitting-balance support: No upper extremity supported, Feet supported ?Sitting balance-Leahy Scale: Good ?  ?  ?  ?  ?  ?  ?  ?  ?  ?  ?  ?  ?  ?  ?  ?  ?  ? ?  ?Cognition Arousal/Alertness: Awake/alert ?Behavior During Therapy: [facility] for tasks assessed/performed ?Overall Cognitive Status: Within Functional Limits for tasks assessed ?  ?  ?  ?  ?  ?  ?  ?  ?  ?  ?  ?  ?  ?  ?  ?  ?  ?  ?  ? ?  ?  Exercises Total Joint Exercises ?Ankle Circles/Pumps: AROM, Both, 10 reps, Seated ?Quad Sets: Both, Strengthening, Seated, Supine ?Gluteal Sets: AROM, 10 reps, Supine, Both ?Hip ABduction/ADduction: Right, Supine, AAROM, 5 reps ?Goniometric ROM: 7-75 ? ?  ?General Comments   ?  ?  ? ?Pertinent Vitals/Pain Pain Assessment ?Pain Assessment: 0-10 ?Pain Score: 3  ?Pain Location: Bil knees ?Pain Descriptors / Indicators: Guarding, Grimacing, Sharp ?Pain Intervention(s): Limited activity within patient's tolerance, Monitored during session,  Premedicated before session, Repositioned  ? ? ?Home Living   ?  ?  ?  ?  ?  ?  ?  ?  ?  ?   ?  ?Prior Function    ?  ?  ?   ? ?PT Goals (current goals can now be found in the care plan section) Acute Rehab PT Goals ?Patient Stated Goal: to get stronger so I can go home after rehab ?PT Goal Formulation: With patient/family ?Time For Goal Achievement: 12/22/21 ?Potential to Achieve Goals: Fair ?Progress towards PT goals: Progressing toward goals ? ?  ?Frequency ? ? ? 7X/week ? ? ? ?  ?PT Plan Current plan remains appropriate  ? ? ?Co-evaluation   ?  ?  ?  ?  ? ?  ?AM-PAC PT "6 Clicks" Mobility   ?Outcome Measure ? Help needed turning from your back to your side while in a flat bed without using bedrails?: A Lot ?Help needed moving from lying on your back to sitting on the side of a flat bed without using bedrails?: A Lot ?Help needed moving to and from a bed to a chair (including a wheelchair)?: Total ?Help needed standing up from a chair using your arms (e.g., wheelchair or bedside chair)?: Total ?Help needed to walk in hospital room?: Total ?Help needed climbing 3-5 steps with a railing? : Total ?6 Click Score: 8 ? ?  ?End of Session Equipment Utilized During Treatment: Gait belt ?Activity Tolerance: Patient limited by pain ?Patient left: in bed;with bed alarm set;in CPM;with nursing/sitter in room ?Nurse Communication: Mobility status ?PT Visit Diagnosis: Unsteadiness on feet (R26.81);Muscle weakness (generalized) (M62.81);Difficulty in walking, not elsewhere classified (R26.2);Pain ?Pain - Right/Left: Right ?Pain - part of body: Knee ?  ? ? ?Time: 6270-3500 ?PT Time Calculation (min) (ACUTE ONLY): 51 min ? ?Charges:  $Therapeutic Activity: 38-52 mins          ?          ? ?[name], PT, DPT ? ? ?[name] ?12/11/2021, 4:07 PM ? ?

## 2021-12-11 NOTE — Progress Notes (Signed)
?  Subjective: ?Pain continues to improve slowly as is ambulatory status with physical therapy.  She is pending a vascular study today with possible transition to aspirin. ? ?Objective: ?Vital signs in last 24 hours: ?Temp:  [97.6 ?F (36.4 ?C)-100 ?F (37.8 ?C)] 97.6 ?F (36.4 ?C) (03/12 0515) ?Pulse Rate:  [90-125] 108 (03/12 0515) ?Resp:  [14-20] 15 (03/12 0515) ?BP: (125-158)/(61-82) 158/82 (03/12 0515) ?SpO2:  [95 %-100 %] 100 % (03/12 0515) ?Weight:  [87.5 kg] 87.5 kg (03/11 1203) ? ?Intake/Output from previous day: ?03/11 0701 - 03/12 0700 ?In: 1945.3 [P.O.:360; I.V.:1585.3] ?Out: -  ?Intake/Output this shift: ?No intake/output data recorded. ? ?Exam: ? ?Sensation intact distally ?Intact pulses distally ?Dorsiflexion/Plantar flexion intact ? ?Labs: ?Recent Labs  ?  12/09/21 ?0252 12/10/21 ?0255  ?HGB 8.5* 8.4*  ? ? ?Recent Labs  ?  12/09/21 ?0252 12/10/21 ?0255  ?WBC 13.5* 15.5*  ?RBC 3.20* 3.17*  ?HCT 27.3* 26.7*  ?PLT 772* 741*  ? ? ?Recent Labs  ?  12/09/21 ?0252  ?NA 136  ?K 3.3*  ?CL 100  ?CO2 27  ?BUN 7*  ?CREATININE 0.50  ?GLUCOSE 127*  ?CALCIUM 8.6*  ? ? ?No results for input(s): LABPT, INR in the last 72 hours. ? ?Assessment/Plan: ?Plan continue mobilization for physical therapy.  They are recommending SNF.  We will plan for vascular study on Monday and if that is negative transition her to aspirin.  She will continue her CPM daily for 1 hour 3 times a day to work on full extension. ? ?[name] ?12/11/2021, 7:41 AM  ? ? ?  ?

## 2021-12-11 NOTE — Progress Notes (Addendum)
Physical Therapy Treatment ?Patient Details ?Name: [name] ?MRN: [id number] ?DOB: 05/27/1951 ?Today's Date: 12/11/2021 ? ? ?History of Present Illness 71 y/o female admitted on 12/08/21 following R TKA. PMH: HTN, glaucoma, carpal tunnel, obesity ? ?  ?PT Comments  ? ? Progressing slowly this morning, sat EOB. Tolerated lower level exercises. Pain limited (RN notified, going to time meds with afternoon therapy however pt not due for stronger meds at time of initial session.) Worked on bed mobility, up to mod assist for LE support. Lt knee also limiting mobility. Reviewed precautions, Rt knee in KI with ice, full extension at end of session. Will continue to progress as tolerated. ?   ?Recommendations for follow up therapy are one component of a multi-disciplinary discharge planning process, led by the attending physician.  Recommendations may be updated based on patient status, additional functional criteria and insurance authorization. ? ?Follow Up Recommendations ? Follow physician's recommendations for discharge plan and follow up therapies ?  ?  ?Assistance Recommended at Discharge Frequent or constant Supervision/Assistance  ?Patient can return home with the following Two people to help with walking and/or transfers;A lot of help with bathing/dressing/bathroom;Assistance with cooking/housework;Assist for transportation;Help with stairs or ramp for entrance ?  ?Equipment Recommendations ? Rolling walker (2 wheels);BSC/3in1  ?  ?Recommendations for Other Services   ? ? ?  ?Precautions / Restrictions Precautions ?Precautions: Fall;Knee ?Precaution Booklet Issued: No ?Required Braces or Orthoses: Knee Immobilizer - Right ?Knee Immobilizer - Right: On when out of bed or walking ?Restrictions ?Weight Bearing Restrictions: Yes ?RLE Weight Bearing: Weight bearing as tolerated  ?  ? ?Mobility ? Bed Mobility ?Overal bed mobility: Needs Assistance ?Bed Mobility: Supine to Sit, Sit to Supine ?  ?  ?Supine to sit: Mod  assist, HOB elevated ?Sit to supine: Mod assist ?  ?General bed mobility comments: Mod assist to scoot LEs to EOB, cues for technique use of bed rail as needed. Mod assist for LEs back into bed. Cues for sequencing. Practiced scooting along bed with pressure through LEs as tolerated, mod assist to faciliate movement. ?  ? ?Transfers ?  ?  ?  ?  ?  ?  ?  ?  ?  ?General transfer comment: not tolerated during this visit ?  ? ?Ambulation/Gait ?  ?  ?  ?  ?  ?  ?  ?  ? ? ?Stairs ?  ?  ?  ?  ?  ? ? ?Wheelchair Mobility ?  ? ?Modified Rankin (Stroke Patients Only) ?  ? ? ?  ?Balance Overall balance assessment: Needs assistance ?Sitting-balance support: No upper extremity supported, Feet supported ?Sitting balance-Leahy Scale: Good ?  ?  ?  ?  ?  ?  ?  ?  ?  ?  ?  ?  ?  ?  ?  ?  ?  ? ?  ?Cognition Arousal/Alertness: Awake/alert ?Behavior During Therapy: [facility] for tasks assessed/performed ?Overall Cognitive Status: Within Functional Limits for tasks assessed ?  ?  ?  ?  ?  ?  ?  ?  ?  ?  ?  ?  ?  ?  ?  ?  ?  ?  ?  ? ?  ?Exercises Total Joint Exercises ?Ankle Circles/Pumps: AROM, Both, 10 reps, Seated ?Quad Sets: Both, Strengthening, Seated, Supine ?Gluteal Sets: AROM, 10 reps, Supine, Both ?Hip ABduction/ADduction: Right, Supine, AAROM, 5 reps ? ?  ?General Comments General comments (skin integrity, edema, etc.): Pain limited, called RN, pt  cannot have another does of ordered narcotic until this after noon. ?  ?  ? ?Pertinent Vitals/Pain Pain Assessment ?Pain Assessment: 0-10 ?Pain Score: 6  ?Pain Location: Bil knees ?Pain Descriptors / Indicators: Guarding, Grimacing, Sharp ?Pain Intervention(s): Limited activity within patient's tolerance, Monitored during session, Patient requesting pain meds-RN notified, Repositioned, Ice applied  ? ? ?Home Living   ?  ?  ?  ?  ?  ?  ?  ?  ?  ?   ?  ?Prior Function    ?  ?  ?   ? ?PT Goals (current goals can now be found in the care plan section) Acute Rehab PT Goals ?Patient Stated Goal:  to get stronger so I can go home after rehab ?PT Goal Formulation: With patient/family ?Time For Goal Achievement: 12/22/21 ?Potential to Achieve Goals: Fair ?Progress towards PT goals: Progressing toward goals ? ?  ?Frequency ? ? ? 7X/week ? ? ? ?  ?PT Plan Current plan remains appropriate  ? ? ?Co-evaluation   ?  ?  ?  ?  ? ?  ?AM-PAC PT "6 Clicks" Mobility   ?Outcome Measure ? Help needed turning from your back to your side while in a flat bed without using bedrails?: A Lot ?Help needed moving from lying on your back to sitting on the side of a flat bed without using bedrails?: A Lot ?Help needed moving to and from a bed to a chair (including a wheelchair)?: Total ?Help needed standing up from a chair using your arms (e.g., wheelchair or bedside chair)?: Total ?Help needed to walk in hospital room?: Total ?Help needed climbing 3-5 steps with a railing? : Total ?6 Click Score: 8 ? ?  ?End of Session Equipment Utilized During Treatment: Gait belt ?Activity Tolerance: Patient limited by pain ?Patient left: in bed;with call bell/phone within reach;with bed alarm set (KI in place for full extension, heel elevated with towel roll - did not tolerate bone foam at this time.) ?Nurse Communication: Mobility status ?PT Visit Diagnosis: Unsteadiness on feet (R26.81);Muscle weakness (generalized) (M62.81);Difficulty in walking, not elsewhere classified (R26.2);Pain ?Pain - Right/Left: Right ?Pain - part of body: Knee ?  ? ? ?Time: 2094-7096 ?PT Time Calculation (min) (ACUTE ONLY): 42 min ? ?Charges:  $Therapeutic Exercise: 8-22 mins ?$Therapeutic Activities: 23-37 mins          ?          ? ?[name], PT, DPT ? ?(Addendum to correct appropriate billing code) ? ?[name] ?12/11/2021, 12:14 PM ? ?

## 2021-12-12 ENCOUNTER — Encounter (HOSPITAL_COMMUNITY): Payer: Self-pay | Admitting: Orthopedic Surgery

## 2021-12-12 MED ORDER — ASPIRIN 81 MG PO CHEW
81.0000 mg | CHEWABLE_TABLET | Freq: Two times a day (BID) | ORAL | Status: DC
  Administered 2021-12-12 – 2021-12-14 (×5): 81 mg via ORAL
  Filled 2021-12-12 (×5): qty 1

## 2021-12-12 NOTE — Plan of Care (Signed)

## 2021-12-12 NOTE — Progress Notes (Signed)
?   12/11/21 2348  ?Assess: MEWS Score  ?Temp 99.1 ?F (37.3 ?C)  ?BP 137/80  ?Pulse Rate (!) 121  ?Resp 19  ?SpO2 97 %  ?Assess: MEWS Score  ?MEWS Temp 0  ?MEWS Systolic 0  ?MEWS Pulse 2  ?MEWS RR 0  ?MEWS LOC 0  ?MEWS Score 2  ?MEWS Score Color Yellow  ?Assess: if the MEWS score is Yellow or Red  ?Were vital signs taken at a resting state? Yes  ?Focused Assessment No change from prior assessment  ?Early Detection of Sepsis Score *See Row Information* Low  ?MEWS guidelines implemented *See Row Information* Yes  ?Treat  ?MEWS Interventions Escalated (See documentation below)  ?Pain Scale 0-10  ?Pain Score 0  ?Take Vital Signs  ?Increase Vital Sign Frequency  Yellow: Q 2hr X 2 then Q 4hr X 2, if remains yellow, continue Q 4hrs  ?Escalate  ?MEWS: Escalate Yellow: discuss with charge nurse/RN and consider discussing with provider and RRT  ?Notify: Charge Nurse/RN  ?Name of Charge Nurse/RN Notified Blanch Media  ?Date Charge Nurse/RN Notified 12/12/21  ?Time Charge Nurse/RN Notified 1218  ?Notify: Provider  ?Provider Name/Title Dr. Sammuel Hines  ?Date Provider Notified 12/12/21  ?Time Provider Notified [phone]  ?Notification Type Page  ?Notification Reason Other (Comment);Change in status ?(yellow mews)  ?Provider response No new orders  ?Date of Provider Response 12/12/21  ?Time of Provider Response [phone]  ?Document  ?Patient Outcome Other (Comment);Not stable and remains on department ?(pt asymptomatic, remains on department. MEWS protocol followed. No acute interventions at this time.)  ?Progress note created (see row info) Yes  ? ?Patient sleeping when VS checked. Patient is asymptomatic to elevated HR. Noted to have an elevated HR previously. Spoke with Dr. Sammuel Hines who recommends monitoring patient at this time as she remains asymptomatic.  ?

## 2021-12-12 NOTE — Progress Notes (Signed)
Physical Therapy Treatment ?Patient Details ?Name: [name] ?MRN: [id number] ?DOB: 01-16-1951 ?Today's Date: 12/12/2021 ? ? ?History of Present Illness 71 y/o female admitted on 12/08/21 following R TKA. PMH: HTN, glaucoma, carpal tunnel, obesity ? ?  ?PT Comments  ? ? Pt continues to progress slowly towards goals secondary to LLE pain. Pt able to initiate transfer to EOB but unable to complete, calling out in pain in L knee. RN present at end of session to administer meds. BLE therex tolerated in supine. Ice applied to L knee, RLE in CPM at end of session. Plan to time PM session with meds. Pt continues to benefit from skilled PT services to progress toward functional mobility goals.  ? ?  ?Recommendations for follow up therapy are one component of a multi-disciplinary discharge planning process, led by the attending physician.  Recommendations may be updated based on patient status, additional functional criteria and insurance authorization. ? ?Follow Up Recommendations ? Follow physician's recommendations for discharge plan and follow up therapies ?  ?  ?Assistance Recommended at Discharge Frequent or constant Supervision/Assistance  ?Patient can return home with the following Two people to help with walking and/or transfers;A lot of help with bathing/dressing/bathroom;Assistance with cooking/housework;Assist for transportation;Help with stairs or ramp for entrance ?  ?Equipment Recommendations ? Rolling walker (2 wheels);BSC/3in1  ?  ?Recommendations for Other Services   ? ? ?  ?Precautions / Restrictions Precautions ?Precautions: Fall;Knee ?Precaution Booklet Issued: No ?Required Braces or Orthoses: Knee Immobilizer - Right ?Knee Immobilizer - Right: On when out of bed or walking ?Restrictions ?Weight Bearing Restrictions: Yes ?RLE Weight Bearing: Weight bearing as tolerated  ?  ? ?Mobility ? Bed Mobility ?Overal bed mobility: Needs Assistance ?Bed Mobility: Supine to Sit ?  ?  ?Supine to sit: HOB elevated,  Max assist ?  ?  ?General bed mobility comments: able to initate RLE to EOB, max assist to mobilize LLE, pt calling out in pain, further progression to EOB terminated. ?  ? ?Transfers ?  ?  ?  ?  ?  ?  ?  ?  ?  ?General transfer comment: unable secondary to pain ?  ? ?Ambulation/Gait ?  ?  ?  ?  ?  ?  ?  ?  ? ? ?Stairs ?  ?  ?  ?  ?  ? ? ?Wheelchair Mobility ?  ? ?Modified Rankin (Stroke Patients Only) ?  ? ? ?  ?Balance Overall balance assessment: Needs assistance ?Sitting-balance support: No upper extremity supported, Feet supported ?Sitting balance-Leahy Scale: Good ?  ?  ?  ?  ?  ?  ?  ?  ?  ?  ?  ?  ?  ?  ?  ?  ?  ? ?  ?Cognition Arousal/Alertness: Awake/alert ?Behavior During Therapy: [facility] for tasks assessed/performed ?Overall Cognitive Status: Within Functional Limits for tasks assessed ?  ?  ?  ?  ?  ?  ?  ?  ?  ?  ?  ?  ?  ?  ?  ?  ?  ?  ?  ? ?  ?Exercises Total Joint Exercises ?Ankle Circles/Pumps: AROM, Both, 10 reps, Supine ?Quad Sets: AROM, Right, Left, 10 reps, Supine ?Gluteal Sets: AROM, Both, 10 reps, Supine ?Heel Slides: AROM, AAROM, Right, Left, 10 reps, Supine ?Hip ABduction/ADduction: AROM, Right, 10 reps, Supine ?Straight Leg Raises: AROM, AAROM, Right, 10 reps, Supine ? ?  ?General Comments   ?  ?  ? ?Pertinent Vitals/Pain Pain  Assessment ?Pain Assessment: Faces ?Faces Pain Scale: Hurts even more ?Pain Location: Bil knees ?Pain Descriptors / Indicators: Guarding, Grimacing, Sharp ?Pain Intervention(s): Limited activity within patient's tolerance, Monitored during session, Repositioned, Ice applied  ? ? ?Home Living   ?  ?  ?  ?  ?  ?  ?  ?  ?  ?   ?  ?Prior Function    ?  ?  ?   ? ?PT Goals (current goals can now be found in the care plan section) Acute Rehab PT Goals ?Patient Stated Goal: to get stronger so I can go home after rehab ?PT Goal Formulation: With patient/family ?Time For Goal Achievement: 12/22/21 ? ?  ?Frequency ? ? ? 7X/week ? ? ? ?  ?PT Plan Current plan remains appropriate   ? ? ?Co-evaluation   ?  ?  ?  ?  ? ?  ?AM-PAC PT "6 Clicks" Mobility   ?Outcome Measure ? Help needed turning from your back to your side while in a flat bed without using bedrails?: A Lot ?Help needed moving from lying on your back to sitting on the side of a flat bed without using bedrails?: A Lot ?Help needed moving to and from a bed to a chair (including a wheelchair)?: Total ?Help needed standing up from a chair using your arms (e.g., wheelchair or bedside chair)?: Total ?Help needed to walk in hospital room?: Total ?Help needed climbing 3-5 steps with a railing? : Total ?6 Click Score: 8 ? ?  ?End of Session   ?Activity Tolerance: Patient limited by pain ?Patient left: in bed;in CPM;with nursing/sitter in room (with RN administering pain medication) ?Nurse Communication: Mobility status ?PT Visit Diagnosis: Unsteadiness on feet (R26.81);Muscle weakness (generalized) (M62.81);Difficulty in walking, not elsewhere classified (R26.2);Pain ?Pain - Right/Left: Right ?Pain - part of body: Knee ?  ? ? ?Time: 3818-2993 ?PT Time Calculation (min) (ACUTE ONLY): 26 min ? ?Charges:  $Therapeutic Exercise: 8-22 mins ?$Therapeutic Activity: 8-22 mins          ?          ? ?Audry Riles. PTA ?Acute Rehabilitation Services ?Office: [phone] ? ? ? ?[name] Adrin Julian ?12/12/2021, 10:11 AM ? ?

## 2021-12-12 NOTE — Care Management Important Message (Signed)
Important Message ? ?Patient Details  ?Name: [name] ?MRN: [id number] ?Date of Birth: 1950-11-10 ? ? ?Medicare Important Message Given:  Yes ? ? ? ? ?Levada Dy  Aleiah Mohammed-Martin ?12/12/2021, 1:59 PM ?

## 2021-12-12 NOTE — TOC Progression Note (Addendum)
Transition of Care (TOC) - Progression Note  ? ? ?Patient Details  ?Name: [name] ?MRN: [id number] ?Date of Birth: May 04, 1951 ? ?Transition of Care (TOC) CM/SW Contact  ?Joanne Chars, LCSW ?Phone Number: ?12/12/2021, 12:16 PM ? ?Clinical Narrative:   CSW discussed bed offers with pt, she chose [facility], confirmed with Arts development officer at [facility].   ?1445: Michelle at [facility] reports pt has 43 SNF days used.  CSW spoke with pt, she was at [facility] from late November until after the new year.  After confirming the dates, pt only had 56 days between leaving [facility] and her current readmission, would still be same episode of care.  CSW discussed with pt, she cannot afford copay days.  She was at ALF between DC from [facility] and her readmission.  Normally lives home alone.  She would like to discuss with her friend.   ? ? ?Expected Discharge Plan: [place] ?Barriers to Discharge: Barriers Resolved ? ?Expected Discharge Plan and Services ?Expected Discharge Plan: [place] ?In-house Referral: Clinical Social Work ?  ?  ?Living arrangements for the past 2 months: [place] ?                ?  ?  ?  ?  ?  ?HH Arranged: PT ?[place] Agency: [place] ?Date HH Agency Contacted: 12/09/21 ?Time [place]: [phone] ?Representative spoke with at [place]: [place] ? ? ?Social Determinants of Health (SDOH) Interventions ?  ? ?Readmission Risk Interventions ?No flowsheet data found. ? ?

## 2021-12-12 NOTE — Progress Notes (Signed)
Patient declined using CPM overnight. Utilized CPM yesterday once. Requested machine off at start of shift, which was removed. Patient educated on importance and encouraged to utilize the CPM.  ?

## 2021-12-12 NOTE — Progress Notes (Signed)
Physical Therapy Treatment ?Patient Details ?Name: [name] ?MRN: [id number] ?DOB: Aug 06, 1951 ?Today's Date: 12/12/2021 ? ? ?History of Present Illness 71 y/o female admitted on 12/08/21 following R TKA. PMH: HTN, glaucoma, carpal tunnel, obesity ? ?  ?PT Comments  ? ? PM Session: Pt received in supine and agreeable to session requiring min-max assist and substantially increased time to come to EOB secondary to pain. Once EOB, pt with fair tolerance for LE therex needing substantially increased time between each exercise and increased time to complete reps. Pt returned to supine and BLE's positioned in neutral rotation. Pt verbalized understanding of education re; ice, HEP, CPM, and positioning.  Pt continues to benefit from skilled PT services to progress toward functional mobility goals.  ? ?  ?Recommendations for follow up therapy are one component of a multi-disciplinary discharge planning process, led by the attending physician.  Recommendations may be updated based on patient status, additional functional criteria and insurance authorization. ? ?Follow Up Recommendations ? Follow physician's recommendations for discharge plan and follow up therapies ?  ?  ?Assistance Recommended at Discharge Frequent or constant Supervision/Assistance  ?Patient can return home with the following Two people to help with walking and/or transfers;A lot of help with bathing/dressing/bathroom;Assistance with cooking/housework;Assist for transportation;Help with stairs or ramp for entrance ?  ?Equipment Recommendations ? Rolling walker (2 wheels);BSC/3in1  ?  ?Recommendations for Other Services   ? ? ?  ?Precautions / Restrictions Precautions ?Precautions: Fall;Knee ?Precaution Booklet Issued: No ?Required Braces or Orthoses: Knee Immobilizer - Right ?Knee Immobilizer - Right: On when out of bed or walking ?Restrictions ?Weight Bearing Restrictions: Yes ?RLE Weight Bearing: Weight bearing as tolerated  ?  ? ?Mobility ? Bed  Mobility ?Overal bed mobility: Needs Assistance ?Bed Mobility: Supine to Sit ?  ?  ?Supine to sit: HOB elevated, Max assist ?  ?  ?General bed mobility comments: min a for RLE to EOB, max assist to mobilize LLE, ?  ? ?Transfers ?  ?  ?  ?  ?  ?  ?  ?  ?  ?General transfer comment: unable secondary to pain, pt also reporting she stood very minimally for months PTA, she reports she spent most of time in bed. did not transfer to [facility], would "shuffle a few steps" if she needeed "to do something" ?  ? ?Ambulation/Gait ?  ?  ?  ?  ?  ?  ?  ?  ? ? ?Stairs ?  ?  ?  ?  ?  ? ? ?Wheelchair Mobility ?  ? ?Modified Rankin (Stroke Patients Only) ?  ? ? ?  ?Balance Overall balance assessment: Needs assistance ?Sitting-balance support: No upper extremity supported, Feet supported ?Sitting balance-Leahy Scale: Good ?  ?  ?  ?  ?  ?  ?  ?  ?  ?  ?  ?  ?  ?  ?  ?  ?  ? ?  ?Cognition Arousal/Alertness: Awake/alert ?Behavior During Therapy: [facility] for tasks assessed/performed ?Overall Cognitive Status: Within Functional Limits for tasks assessed ?  ?  ?  ?  ?  ?  ?  ?  ?  ?  ?  ?  ?  ?  ?  ?  ?  ?  ?  ? ?  ?Exercises Total Joint Exercises ?Ankle Circles/Pumps: AROM, Both, 10 reps, Seated ?Hip ABduction/ADduction: AROM, 10 reps, Both, Seated ?Long Arc Quad: AROM, Right, Left, 10 reps, Seated ?Other Exercises ?Other Exercises: seated marching x20, 2  sets ? ?  ?General Comments   ?  ?  ? ?Pertinent Vitals/Pain Pain Assessment ?Pain Assessment: Faces ?Faces Pain Scale: Hurts even more ?Pain Location: Bil knees ?Pain Descriptors / Indicators: Guarding, Grimacing, Sharp ?Pain Intervention(s): Limited activity within patient's tolerance, Monitored during session, Repositioned, Ice applied  ? ? ?Home Living   ?  ?  ?  ?  ?  ?  ?  ?  ?  ?   ?  ?Prior Function    ?  ?  ?   ? ?PT Goals (current goals can now be found in the care plan section) Acute Rehab PT Goals ?Patient Stated Goal: to get stronger so I can go home after rehab ?PT Goal Formulation:  With patient/family ?Time For Goal Achievement: 12/22/21 ? ?  ?Frequency ? ? ? 7X/week ? ? ? ?  ?PT Plan Current plan remains appropriate  ? ? ?Co-evaluation   ?  ?  ?  ?  ? ?  ?AM-PAC PT "6 Clicks" Mobility   ?Outcome Measure ? Help needed turning from your back to your side while in a flat bed without using bedrails?: A Lot ?Help needed moving from lying on your back to sitting on the side of a flat bed without using bedrails?: A Lot ?Help needed moving to and from a bed to a chair (including a wheelchair)?: Total ?Help needed standing up from a chair using your arms (e.g., wheelchair or bedside chair)?: Total ?Help needed to walk in hospital room?: Total ?Help needed climbing 3-5 steps with a railing? : Total ?6 Click Score: 8 ? ?  ?End of Session   ?Activity Tolerance: Patient limited by pain ?Patient left: in bed;with call bell/phone within reach (with RN administering pain medication) ?Nurse Communication: Mobility status ?PT Visit Diagnosis: Unsteadiness on feet (R26.81);Muscle weakness (generalized) (M62.81);Difficulty in walking, not elsewhere classified (R26.2);Pain ?Pain - Right/Left: Right ?Pain - part of body: Knee ?  ? ? ?Time: 1520-1601 ?PT Time Calculation (min) (ACUTE ONLY): 41 min ? ?Charges:  $Therapeutic Exercise: 23-37 mins ?$Therapeutic Activity: 8-22 mins          ?          ? ?Audry Riles. PTA ?Acute Rehabilitation Services ?Office: [phone] ? ? ? ?[name] ?12/12/2021, 4:17 PM ? ?

## 2021-12-12 NOTE — Progress Notes (Signed)
?  Subjective: ?[name] is a 71 y.o. female s/p right TKA.  They are POD 4.  Pt's pain is controlled.  Pt denies numbness/tingling/weakness.  Pt has ambulated with some difficulty.  Denies any dizziness, lightheadedness, chest pain.  Had some intermittent shortness of breath that was resolved with albuterol.  Denies any abdominal pain.  She is passing gas.  Still dealing with constipation though.  ? ?Objective: ?Vital signs in last 24 hours: ?Temp:  [97.9 ?F (36.6 ?C)-99.2 ?F (37.3 ?C)] 98.5 ?F (36.9 ?C) (03/13 6063) ?Pulse Rate:  [102-121] 102 (03/13 0818) ?Resp:  [16-19] 18 (03/13 0818) ?BP: (135-161)/(70-90) 157/90 (03/13 0733) ?SpO2:  [95 %-100 %] 99 % (03/13 0818) ? ?Intake/Output from previous day: ?03/12 0701 - 03/13 0700 ?In: 1277.6 [I.V.:1277.6] ?Out: 500 [Urine:500] ?Intake/Output this shift: ?Total I/O ?In: -  ?Out: 500 [Urine:500] ? ?Exam: ? ?No gross blood or drainage overlying the dressing ?1+ DP pulse ?Sensation intact distally in the right foot ?Able to dorsiflex and plantarflex the right foot ?No calf tenderness.  Negative Homans' sign. ? ? ?Labs: ?Recent Labs  ?  12/10/21 ?0255  ?HGB 8.4*  ? ?Recent Labs  ?  12/10/21 ?0255  ?WBC 15.5*  ?RBC 3.17*  ?HCT 26.7*  ?PLT 741*  ? ?No results for input(s): NA, K, CL, CO2, BUN, CREATININE, GLUCOSE, CALCIUM in the last 72 hours. ?No results for input(s): LABPT, INR in the last 72 hours. ? ?Assessment/Plan: ?Pt is POD 4 s/p right TKA.   ? -Plan to discharge to SNF today or tomorrow likely ? -WBAT with a walker ? -With negative ultrasound for DVT, transition from Xarelto to aspirin twice daily 81 mg ?  ? ? ?[name] ?12/12/2021, 1:36 PM  ? ? ?   ?

## 2021-12-13 LAB — CBC
HCT: 26.1 % — ABNORMAL LOW (ref 36.0–46.0)
Hemoglobin: 8 g/dL — ABNORMAL LOW (ref 12.0–15.0)
MCH: 26.3 pg (ref 26.0–34.0)
MCHC: 30.7 g/dL (ref 30.0–36.0)
MCV: 85.9 fL (ref 80.0–100.0)
Platelets: 623 10*3/uL — ABNORMAL HIGH (ref 150–400)
RBC: 3.04 MIL/uL — ABNORMAL LOW (ref 3.87–5.11)
RDW: 18.3 % — ABNORMAL HIGH (ref 11.5–15.5)
WBC: 13.1 10*3/uL — ABNORMAL HIGH (ref 4.0–10.5)
nRBC: 0 % (ref 0.0–0.2)

## 2021-12-13 MED ORDER — POLYETHYLENE GLYCOL 3350 17 G PO PACK
17.0000 g | PACK | Freq: Every day | ORAL | Status: DC
  Administered 2021-12-13 – 2021-12-14 (×2): 17 g via ORAL
  Filled 2021-12-13 (×2): qty 1

## 2021-12-13 NOTE — Progress Notes (Signed)
?   12/13/21 2029  ?Assess: MEWS Score  ?Temp 100.2 ?F (37.9 ?C)  ?BP (!) 147/64  ?Pulse Rate (!) 124  ?Resp 18  ?Level of Consciousness Alert  ?SpO2 93 %  ?O2 Device Room Air  ?Assess: MEWS Score  ?MEWS Temp 0  ?MEWS Systolic 0  ?MEWS Pulse 2  ?MEWS RR 0  ?MEWS LOC 0  ?MEWS Score 2  ?MEWS Score Color Yellow  ?Assess: if the MEWS score is Yellow or Red  ?Were vital signs taken at a resting state? Yes  ?Focused Assessment No change from prior assessment  ?Early Detection of Sepsis Score *See Row Information* Low  ?MEWS guidelines implemented *See Row Information* Yes  ?Treat  ?Pain Scale 0-10  ?Pain Score 6  ?Pain Type Surgical pain  ?Pain Location Knee  ?Pain Orientation Right  ?Pain Descriptors / Indicators Aching  ?Pain Frequency Intermittent  ?Pain Onset On-going  ?Patients Stated Pain Goal 2  ?Pain Intervention(s) Medication (See eMAR) ?(scheduled oxycontin given)  ?Take Vital Signs  ?Increase Vital Sign Frequency  Yellow: Q 2hr X 2 then Q 4hr X 2, if remains yellow, continue Q 4hrs  ?Escalate  ?MEWS: Escalate Yellow: discuss with charge nurse/RN and consider discussing with provider and RRT  ?Notify: Charge Nurse/RN  ?Name of Charge Nurse/RN Notified Daune Perch, RN  ?Date Charge Nurse/RN Notified 12/13/21  ?Time Charge Nurse/RN Notified 2030  ?Document  ?Patient Outcome Not stable and remains on department  ?Progress note created (see row info) Yes  ? ? ?

## 2021-12-13 NOTE — TOC Progression Note (Signed)
Transition of Care (TOC) - Progression Note  ? ? ?Patient Details  ?Name: [name] ?MRN: [id number] ?Date of Birth: 1950-10-04 ? ?Transition of Care (TOC) CM/SW Contact  ?Joanne Chars, LCSW ?Phone Number: ?12/13/2021, 3:35 PM ? ?Clinical Narrative:   ?Cathy at [facility] confirmed pt DC date from [facility] was 10/13/21. ? ?CSW spoke with pt and with pt friend [name] on speakerphone, discussed the issue with the copay days.  [name] in agreement that pt cannot be home alone and that SNF is needed.  Pt asked [name] to speak with Sharyn Lull at [facility] about financial arrangements so she can be admitted.  CSW spoke with Sharyn Lull at [facility] who will reach out. ? ?1520: auth request submitted in [facility].    ? ? ? ?Expected Discharge Plan: [place] ?Barriers to Discharge: Barriers Resolved ? ?Expected Discharge Plan and Services ?Expected Discharge Plan: [place] ?In-house Referral: Clinical Social Work ?  ?  ?Living arrangements for the past 2 months: [place] ?                ?  ?  ?  ?  ?  ?HH Arranged: PT ?[place] Agency: [place] ?Date HH Agency Contacted: 12/09/21 ?Time [place]: [phone] ?Representative spoke with at [place]: [place] ? ? ?Social Determinants of Health (SDOH) Interventions ?  ? ?Readmission Risk Interventions ?No flowsheet data found. ? ?

## 2021-12-13 NOTE — Plan of Care (Signed)

## 2021-12-13 NOTE — Progress Notes (Addendum)
?  Subjective: ?[name] is a 71 y.o. female s/p right TKA.  They are POD 5.  Pt's pain is controlled.  Pt denies numbness/tingling/weakness.  Pt has ambulated with some difficulty.  Denies any current chest pain, shortness of breath.  Denies any abdominal pain.  She is passing gas.  Still dealing with constipation though.  She would like to try MiraLAX today. ? ?Objective: ?Vital signs in last 24 hours: ?Temp:  [97.8 ?F (36.6 ?C)-99.3 ?F (37.4 ?C)] 98 ?F (36.7 ?C) (03/14 0739) ?Pulse Rate:  [112-118] 112 (03/14 0739) ?Resp:  [17-20] 18 (03/14 0739) ?BP: (117-155)/(64-76) 117/65 (03/14 0739) ?SpO2:  [93 %-96 %] 96 % (03/14 0825) ? ?Intake/Output from previous day: ?03/13 0701 - 03/14 0700 ?In: 784.2 [P.O.:240; I.V.:544.2] ?Out: 500 [Urine:500] ?Intake/Output this shift: ?No intake/output data recorded. ? ?Exam: ? ?No gross blood or drainage overlying the dressing ?1+ DP pulse ?Sensation intact distally in the right foot ?Able to dorsiflex and plantarflex the right foot ?No calf tenderness.  Negative Homans' sign. ? ? ?Labs: ?No results for input(s): HGB in the last 72 hours. ? ?No results for input(s): WBC, RBC, HCT, PLT in the last 72 hours. ? ?No results for input(s): NA, K, CL, CO2, BUN, CREATININE, GLUCOSE, CALCIUM in the last 72 hours. ?No results for input(s): LABPT, INR in the last 72 hours. ? ?Assessment/Plan: ?Pt is POD 5 s/p right TKA.   ? -Plan to discharge to SNF tomorrow likely ? -WBAT with a walker ? -Aspirin 81 mg twice daily plus SCDs for DVT prophylaxis ? -Check CBC today with tachycardia and history of anemia ?  ? ? ?[name] ?12/13/2021, 10:41 AM  ? ? ?   ?

## 2021-12-13 NOTE — Progress Notes (Signed)
Physical Therapy Treatment ?Patient Details ?Name: [name] ?MRN: [id number] ?DOB: [birth year] ?Today's Date: 12/13/2021 ? ? ?History of Present Illness 71 y/o female admitted on 12/08/21 following R TKA. PMH: HTN, glaucoma, carpal tunnel, obesity ? ?  ?PT Comments  ? ? Pt making slow progress towards goals requiring max-total assist to for standing attempts. Pt unable to generate force through BLEs to power up, max cues needed for pt to shift weight forward over toes. Pt with good tolerance for LE therex. Current plan remains appropriate to address deficits and maximize functional independence and decrease caregiver burden. Pt continues to benefit from skilled PT services to progress toward functional mobility goals.  ?  ?Recommendations for follow up therapy are one component of a multi-disciplinary discharge planning process, led by the attending physician.  Recommendations may be updated based on patient status, additional functional criteria and insurance authorization. ? ?Follow Up Recommendations ? Follow physician's recommendations for discharge plan and follow up therapies ?  ?  ?Assistance Recommended at Discharge Frequent or constant Supervision/Assistance  ?Patient can return home with the following Two people to help with walking and/or transfers;A lot of help with bathing/dressing/bathroom;Assistance with cooking/housework;Assist for transportation;Help with stairs or ramp for entrance ?  ?Equipment Recommendations ? Rolling walker (2 wheels);BSC/3in1  ?  ?Recommendations for Other Services   ? ? ?  ?Precautions / Restrictions Precautions ?Precautions: Fall;Knee ?Precaution Booklet Issued: No ?Required Braces or Orthoses: Knee Immobilizer - Right ?Knee Immobilizer - Right: On when out of bed or walking ?Restrictions ?Weight Bearing Restrictions: Yes ?RLE Weight Bearing: Weight bearing as tolerated  ?  ? ?Mobility ? Bed Mobility ?Overal bed mobility: Needs Assistance ?Bed Mobility: Supine to Sit ?  ?   ?Supine to sit: HOB elevated, Max assist ?  ?  ?General bed mobility comments: min a for RLE to EOB, max assist to mobilize LLE, ?  ? ?Transfers ?Overall transfer level: Needs assistance ?Equipment used:  (back of chair) ?Transfers: Sit to/from Stand ?Sit to Stand: Total assist, From elevated surface ?  ?  ?  ?  ?  ?General transfer comment: pt unable to shift weight forward over toes or generate power to elevate hips off bed with total assist. ?  ? ?Ambulation/Gait ?  ?  ?  ?  ?  ?  ?  ?  ? ? ?Stairs ?  ?  ?  ?  ?  ? ? ?Wheelchair Mobility ?  ? ?Modified Rankin (Stroke Patients Only) ?  ? ? ?  ?Balance Overall balance assessment: Needs assistance ?Sitting-balance support: No upper extremity supported, Feet supported ?Sitting balance-Leahy Scale: Good ?  ?  ?  ?  ?  ?  ?  ?  ?  ?  ?  ?  ?  ?  ?  ?  ?  ? ?  ?Cognition Arousal/Alertness: Awake/alert ?Behavior During Therapy: [facility] for tasks assessed/performed ?Overall Cognitive Status: Within Functional Limits for tasks assessed ?  ?  ?  ?  ?  ?  ?  ?  ?  ?  ?  ?  ?  ?  ?  ?  ?  ?  ?  ? ?  ?Exercises Total Joint Exercises ?Ankle Circles/Pumps: AROM, Both, 10 reps, Seated ?Heel Slides: AROM, Right, Left, 10 reps, Supine ?Long Arc Quad: AROM, Right, Left, 10 reps, Seated ?Other Exercises ?Other Exercises: seated marching x20, 2 sets ?Other Exercises: standing trials x3 ? ?  ?General Comments   ?  ?  ? ?  Pertinent Vitals/Pain Pain Assessment ?Pain Assessment: Faces ?Faces Pain Scale: Hurts even more ?Pain Location: Bil knees ?Pain Descriptors / Indicators: Guarding, Grimacing, Sharp, Moaning  ? ? ?Home Living   ?  ?  ?  ?  ?  ?  ?  ?  ?  ?   ?  ?Prior Function    ?  ?  ?   ? ?PT Goals (current goals can now be found in the care plan section) Acute Rehab PT Goals ?Patient Stated Goal: to get stronger so I can go home after rehab ?PT Goal Formulation: With patient/family ?Time For Goal Achievement: 12/22/21 ? ?  ?Frequency ? ? ? 7X/week ? ? ? ?  ?PT Plan Current plan remains  appropriate  ? ? ?Co-evaluation   ?  ?  ?  ?  ? ?  ?AM-PAC PT "6 Clicks" Mobility   ?Outcome Measure ? Help needed turning from your back to your side while in a flat bed without using bedrails?: A Lot ?Help needed moving from lying on your back to sitting on the side of a flat bed without using bedrails?: A Lot ?Help needed moving to and from a bed to a chair (including a wheelchair)?: Total ?Help needed standing up from a chair using your arms (e.g., wheelchair or bedside chair)?: Total ?Help needed to walk in hospital room?: Total ?Help needed climbing 3-5 steps with a railing? : Total ?6 Click Score: 8 ? ?  ?End of Session   ?Activity Tolerance: Patient limited by pain ?Patient left: in bed;with call bell/phone within reach;with bed alarm set (with RN administering pain medication) ?Nurse Communication: Mobility status ?PT Visit Diagnosis: Unsteadiness on feet (R26.81);Muscle weakness (generalized) (M62.81);Difficulty in walking, not elsewhere classified (R26.2);Pain ?Pain - Right/Left: Right ?Pain - part of body: Knee ?  ? ? ?Time: 5053-9767 ?PT Time Calculation (min) (ACUTE ONLY): 32 min ? ?Charges:  $Therapeutic Exercise: 23-37 mins          ?          ? ?Audry Riles. PTA ?Acute Rehabilitation Services ?Office: [phone] ? ? ? ?[name] ?12/13/2021, 3:59 PM ? ?

## 2021-12-14 DIAGNOSIS — M6250 Muscle wasting and atrophy, not elsewhere classified, unspecified site: Secondary | ICD-10-CM | POA: Diagnosis not present

## 2021-12-14 DIAGNOSIS — I10 Essential (primary) hypertension: Secondary | ICD-10-CM | POA: Diagnosis not present

## 2021-12-14 DIAGNOSIS — D649 Anemia, unspecified: Secondary | ICD-10-CM | POA: Diagnosis not present

## 2021-12-14 DIAGNOSIS — Z471 Aftercare following joint replacement surgery: Secondary | ICD-10-CM | POA: Diagnosis not present

## 2021-12-14 DIAGNOSIS — R799 Abnormal finding of blood chemistry, unspecified: Secondary | ICD-10-CM | POA: Diagnosis not present

## 2021-12-14 DIAGNOSIS — J4521 Mild intermittent asthma with (acute) exacerbation: Secondary | ICD-10-CM | POA: Diagnosis not present

## 2021-12-14 DIAGNOSIS — F419 Anxiety disorder, unspecified: Secondary | ICD-10-CM | POA: Diagnosis not present

## 2021-12-14 DIAGNOSIS — R531 Weakness: Secondary | ICD-10-CM | POA: Diagnosis not present

## 2021-12-14 DIAGNOSIS — F331 Major depressive disorder, recurrent, moderate: Secondary | ICD-10-CM | POA: Diagnosis not present

## 2021-12-14 DIAGNOSIS — Z96651 Presence of right artificial knee joint: Secondary | ICD-10-CM | POA: Diagnosis not present

## 2021-12-14 DIAGNOSIS — Z741 Need for assistance with personal care: Secondary | ICD-10-CM | POA: Diagnosis not present

## 2021-12-14 DIAGNOSIS — M25562 Pain in left knee: Secondary | ICD-10-CM | POA: Diagnosis not present

## 2021-12-14 DIAGNOSIS — Z7401 Bed confinement status: Secondary | ICD-10-CM | POA: Diagnosis not present

## 2021-12-14 DIAGNOSIS — M6281 Muscle weakness (generalized): Secondary | ICD-10-CM | POA: Diagnosis not present

## 2021-12-14 DIAGNOSIS — Z4789 Encounter for other orthopedic aftercare: Secondary | ICD-10-CM | POA: Diagnosis not present

## 2021-12-14 DIAGNOSIS — M1711 Unilateral primary osteoarthritis, right knee: Secondary | ICD-10-CM | POA: Diagnosis not present

## 2021-12-14 DIAGNOSIS — M170 Bilateral primary osteoarthritis of knee: Secondary | ICD-10-CM | POA: Diagnosis not present

## 2021-12-14 DIAGNOSIS — R262 Difficulty in walking, not elsewhere classified: Secondary | ICD-10-CM | POA: Diagnosis not present

## 2021-12-14 DIAGNOSIS — D869 Sarcoidosis, unspecified: Secondary | ICD-10-CM | POA: Diagnosis not present

## 2021-12-14 DIAGNOSIS — J45909 Unspecified asthma, uncomplicated: Secondary | ICD-10-CM | POA: Diagnosis not present

## 2021-12-14 DIAGNOSIS — T8484XD Pain due to internal orthopedic prosthetic devices, implants and grafts, subsequent encounter: Secondary | ICD-10-CM | POA: Diagnosis not present

## 2021-12-14 DIAGNOSIS — F330 Major depressive disorder, recurrent, mild: Secondary | ICD-10-CM | POA: Diagnosis not present

## 2021-12-14 DIAGNOSIS — H4089 Other specified glaucoma: Secondary | ICD-10-CM | POA: Diagnosis not present

## 2021-12-14 DIAGNOSIS — M818 Other osteoporosis without current pathological fracture: Secondary | ICD-10-CM | POA: Diagnosis not present

## 2021-12-14 MED ORDER — ASPIRIN 81 MG PO CHEW: 81.0000 mg | CHEWABLE_TABLET | Freq: Two times a day (BID) | ORAL | 0 refills | Status: DC

## 2021-12-14 MED ORDER — OXYCODONE HCL 10 MG PO TABS: 10.0000 mg | ORAL_TABLET | ORAL | 0 refills | Status: DC | PRN

## 2021-12-14 MED ORDER — DEXTROSE 50 % IV SOLN: 25.0000 g | Freq: Once | INTRAVENOUS | Status: DC

## 2021-12-14 NOTE — TOC Transition Note (Signed)
Transition of Care (TOC) - CM/SW Discharge Note ? ? ?Patient Details  ?Name: [name] ?MRN: [id number] ?Date of Birth: 08/03/1951 ? ?Transition of Care [facility]) CM/SW Contact:  ?Joanne Chars, LCSW ?Phone Number: ?12/14/2021, 12:35 PM ? ? ?Clinical Narrative:   Pt discharging to [facility], room 1102B.  RN call report to [phone].  ? ? ? ?Final next level of care: [place] ?Barriers to Discharge: Barriers Resolved ? ? ?Patient Goals and CMS Choice ?Patient states their goals for this hospitalization and ongoing recovery are:: get home ?CMS Medicare.gov Compare Post Acute Care list provided to:: Patient ?Choice offered to / list presented to : Patient ? ?Discharge Placement ?  ?           ?Patient chooses bed at:  [facility]) ?Patient to be transferred to facility by: [facility] ?Name of family member notified: friend [name] ?Patient and family notified of of transfer: 12/14/21 ? ?Discharge Plan and Services ?In-house Referral: Clinical Social Work ?  ?           ?  ?  ?  ?  ?  ?HH Arranged: PT ?[place] Agency: [place] ?Date HH Agency Contacted: 12/09/21 ?Time [place]: [phone] ?Representative spoke with at [place]: [place] ? ?Social Determinants of Health (SDOH) Interventions ?  ? ? ?Readmission Risk Interventions ?No flowsheet data found. ? ? ? ? ?

## 2021-12-14 NOTE — Discharge Summary (Signed)
Physician Discharge Summary  ? ? ? ? ?Patient ID: ?[name] ?MRN: [id number] ?DOB/AGE: 04-Dec-1950 71 y.o. ? ?Admit date: 12/08/2021 ?Discharge date: 12/14/21 ? ?Admission Diagnoses:  ?Principal Problem: ?  S/P total knee arthroplasty, right ? ? ?Discharge Diagnoses:  ?Same ? ?Surgeries: Procedure(s): ?RIGHT TOTAL KNEE ARTHROPLASTY APPLICATION OF WOUND VAC on 12/08/2021 ?  ?Consultants:  ? ?Discharged Condition: Stable ? ?Hospital Course: [name] is an 71 y.o. female who was admitted 12/08/2021 with a chief complaint of right knee pain, and found to have a diagnosis of right knee osteoarthritis.  They were brought to the operating room on 12/08/2021 and underwent the above named procedures.  Pt awoke from anesthesia without complication and was transferred to the floor.  Throughout her stay, wanted struggled with mobility and pain control.  She was fairly deconditioned coming into the surgery and tissue quality was not ideal with low albumin noted on admission.  She did progress slowly with mobility and pain control but with her continued struggles, skilled nursing facility was the ideal disposition for her.  She was discharged to skilled nursing facility on 12/14/2021.  She had no complaints of chest pain or shortness of breath.  She had an ultrasound that was negative for DVT during her stay..  Pt will f/u with Dr. Marlou Sa in clinic in ~2 weeks.  ? ?Antibiotics given:  ?Anti-infectives (From admission, onward)  ? ? Start     Dose/Rate Route Frequency Ordered Stop  ? 12/08/21 0833  vancomycin (VANCOCIN) powder  Status:  Discontinued       ?   As needed 12/08/21 0833 12/08/21 1054  ? 12/08/21 0600  ceFAZolin (ANCEF) IVPB 2g/100 mL premix       ? 2 g ?200 mL/hr over 30 Minutes Intravenous On call to O.R. 12/08/21 6644 12/08/21 0749  ? ?  ?. ? ?Recent vital signs:  ?Vitals:  ? 12/14/21 0734 12/14/21 0802  ?BP:  (!) 152/66  ?Pulse:  (!) 113  ?Resp:  18  ?Temp:  98.7 ?F (37.1 ?C)  ?SpO2: 98% 96%  ? ? ?Recent laboratory  studies:  ?Results for orders placed or performed during the hospital encounter of 12/08/21  ?Anaerobic culture w Gram Stain  ? Specimen: Urine, In & Out Cath; Urinary  ?Result Value Ref Range  ? Specimen Description IN/OUT CATH URINE   ? Special Requests CATH URINE SPEC A   ? Gram Stain    ?  WBC PRESENT, PREDOMINANTLY MONONUCLEAR ?NO ORGANISMS SEEN ?CYTOSPIN SMEAR ?Performed at [facility], [place] [address] [place], [place] [address] ?  ? Culture    ?  RARE PSEUDOMONAS AERUGINOSA ?SUSCEPTIBILITIES TO FOLLOW ?NO ANAEROBES ISOLATED; CULTURE IN PROGRESS FOR 5 DAYS ?  ? Report Status PENDING   ?Urine Culture  ? Specimen: Urine, Catheterized  ?Result Value Ref Range  ? Specimen Description IN/OUT CATH URINE   ? Special Requests CATH URINE SPEC A   ? Culture    ?  NO GROWTH ?Performed at [facility], [place] [address], [place], [place] [address] ?  ? Report Status 12/09/2021 FINAL   ?Urinalysis, Routine w reflex microscopic Urine, Catheterized  ?Result Value Ref Range  ? Color, Urine YELLOW YELLOW  ? APPearance HAZY (A) CLEAR  ? Specific Gravity, Urine 1.023 1.005 - 1.030  ? pH 5.0 5.0 - 8.0  ? Glucose, UA NEGATIVE NEGATIVE mg/dL  ? Hgb urine dipstick NEGATIVE NEGATIVE  ? Bilirubin Urine NEGATIVE NEGATIVE  ? Ketones,  ur NEGATIVE NEGATIVE mg/dL  ? Protein, ur NEGATIVE NEGATIVE mg/dL  ? Nitrite NEGATIVE NEGATIVE  ? Leukocytes,Ua NEGATIVE NEGATIVE  ?CBC  ?Result Value Ref Range  ? WBC 13.5 (H) 4.0 - 10.5 K/uL  ? RBC 3.20 (L) 3.87 - 5.11 MIL/uL  ? Hemoglobin 8.5 (L) 12.0 - 15.0 g/dL  ? HCT 27.3 (L) 36.0 - 46.0 %  ? MCV 85.3 80.0 - 100.0 fL  ? MCH 26.6 26.0 - 34.0 pg  ? MCHC 31.1 30.0 - 36.0 g/dL  ? RDW 17.7 (H) 11.5 - 15.5 %  ? Platelets 772 (H) 150 - 400 K/uL  ? nRBC 0.0 0.0 - 0.2 %  ?Basic metabolic panel  ?Result Value Ref Range  ? Sodium 136 135 - 145 mmol/L  ? Potassium 3.3 (L) 3.5 - 5.1 mmol/L  ? Chloride 100 98 - 111 mmol/L  ? CO2 27 22 - 32 mmol/L  ? Glucose, Bld 127 (H) 70 - 99 mg/dL  ? BUN 7 (L) 8 - 23  mg/dL  ? Creatinine, Ser 0.50 0.44 - 1.00 mg/dL  ? Calcium 8.6 (L) 8.9 - 10.3 mg/dL  ? GFR, Estimated >60 >60 mL/min  ? Anion gap 9 5 - 15  ?CBC  ?Result Value Ref Range  ? WBC 15.5 (H) 4.0 - 10.5 K/uL  ? RBC 3.17 (L) 3.87 - 5.11 MIL/uL  ? Hemoglobin 8.4 (L) 12.0 - 15.0 g/dL  ? HCT 26.7 (L) 36.0 - 46.0 %  ? MCV 84.2 80.0 - 100.0 fL  ? MCH 26.5 26.0 - 34.0 pg  ? MCHC 31.5 30.0 - 36.0 g/dL  ? RDW 18.0 (H) 11.5 - 15.5 %  ? Platelets 741 (H) 150 - 400 K/uL  ? nRBC 0.0 0.0 - 0.2 %  ?Albumin  ?Result Value Ref Range  ? Albumin 2.1 (L) 3.5 - 5.0 g/dL  ?Prealbumin  ?Result Value Ref Range  ? Prealbumin 8.8 (L) 18 - 38 mg/dL  ?CBC  ?Result Value Ref Range  ? WBC 13.1 (H) 4.0 - 10.5 K/uL  ? RBC 3.04 (L) 3.87 - 5.11 MIL/uL  ? Hemoglobin 8.0 (L) 12.0 - 15.0 g/dL  ? HCT 26.1 (L) 36.0 - 46.0 %  ? MCV 85.9 80.0 - 100.0 fL  ? MCH 26.3 26.0 - 34.0 pg  ? MCHC 30.7 30.0 - 36.0 g/dL  ? RDW 18.3 (H) 11.5 - 15.5 %  ? Platelets 623 (H) 150 - 400 K/uL  ? nRBC 0.0 0.0 - 0.2 %  ? ? ?Discharge Medications:   ?Allergies as of 12/14/2021   ? ?   Reactions  ? Ibuprofen Rash  ? Sulfa Antibiotics Rash  ? ?  ? ?  ?Medication List  ?  ? ?STOP taking these medications   ? ?traMADol 50 MG tablet ?Commonly known as: ULTRAM ?  ? ?  ? ?TAKE these medications   ? ?acetaminophen 500 MG tablet ?Commonly known as: TYLENOL ?Take 1,000 mg by mouth in the morning and at bedtime. ?  ?albuterol 108 (90 Base) MCG/ACT inhaler ?Commonly known as: VENTOLIN HFA ?TAKE 2 PUFFS BY MOUTH EVERY 6 HOURS AS NEEDED ?What changed:  ?how much to take ?how to take this ?when to take this ?reasons to take this ?additional instructions ?  ?aspirin 81 MG chewable tablet ?Chew 1 tablet (81 mg total) by mouth 2 (two) times daily. ?  ?cyclobenzaprine 5 MG tablet ?Commonly known as: FLEXERIL ?Take 5 mg by mouth every 8 (eight) hours as needed for muscle spasms. ?  ?  cycloSPORINE 0.05 % ophthalmic emulsion ?Commonly known as: RESTASIS ?Place 1 drop into both eyes 2 (two) times daily. ?   ?docusate sodium 100 MG capsule ?Commonly known as: COLACE ?Take 100 mg by mouth daily. ?  ?DULoxetine 30 MG capsule ?Commonly known as: CYMBALTA ?Take 30 mg by mouth daily. ?  ?estradiol 1 MG tablet ?Commonly known as: ESTRACE ?Take 1 tablet (1 mg total) by mouth daily. ?  ?fluticasone 50 MCG/ACT nasal spray ?Commonly known as: FLONASE ?Place 1 spray into both nostrils daily. ?What changed:  ?when to take this ?reasons to take this ?  ?furosemide 20 MG tablet ?Commonly known as: LASIX ?Take 20 mg by mouth daily as needed for edema. ?  ?guaiFENesin-dextromethorphan 100-10 MG/5ML syrup ?Commonly known as: ROBITUSSIN DM ?Take 10 mLs by mouth every 6 (six) hours as needed for cough. ?  ?levocetirizine 5 MG tablet ?Commonly known as: Xyzal Allergy 24HR ?Take 1 tablet (5 mg total) by mouth every evening. ?What changed: when to take this ?  ?omeprazole 20 MG capsule ?Commonly known as: PRILOSEC ?Take 20 mg by mouth in the morning and at bedtime. ?  ?ondansetron 8 MG disintegrating tablet ?Commonly known as: ZOFRAN-ODT ?Take 8 mg by mouth 2 (two) times daily as needed for nausea/vomiting. ?  ?oxyCODONE 15 mg 12 hr tablet ?Commonly known as: OXYCONTIN ?Take 15 mg by mouth daily as needed (osteoarthritis). ?What changed: Another medication with the same name was added. Make sure you understand how and when to take each. ?  ?Oxycodone HCl 10 MG Tabs ?Take 1 tablet (10 mg total) by mouth every 4 (four) hours as needed for severe pain. ?What changed: You were already taking a medication with the same name, and this prescription was added. Make sure you understand how and when to take each. ?  ?polyethylene glycol powder 17 GM/SCOOP powder ?Commonly known as: GLYCOLAX/MIRALAX ?Take 17 g by mouth daily as needed. ?What changed: reasons to take this ?  ?Vitamin D (Ergocalciferol) 1.25 MG (50000 UNIT) Caps capsule ?Commonly known as: DRISDOL ?Take 1 capsule (50,000 Units total) by mouth every 7 (seven) days. ?  ?Wixela Inhub  250-50 MCG/ACT Aepb ?Generic drug: fluticasone-salmeterol ?INHALE 1 PUFF BY MOUTH TWICE A DAY ?What changed: See the new instructions. ?  ? ?  ? ? ?Diagnostic Studies: VAS [name] LOWER EXTREMITY VENOUS (DVT) ? ?

## 2021-12-14 NOTE — TOC Progression Note (Addendum)
Transition of Care (TOC) - Progression Note  ? ? ?Patient Details  ?Name: [name] ?MRN: [id number] ?Date of Birth: [birth year] ? ?Transition of Care (TOC) CM/SW Contact  ?Joanne Chars, LCSW ?Phone Number: ?12/14/2021, 8:17 AM ? ?Clinical Narrative:   Josem Kaufmann approved in [employer]: Plan auth ID: [id number], [phone], 3 days: 3/15-3/17. ? ?1000: MD informed. ? ?Expected Discharge Plan: [place] ?Barriers to Discharge: Barriers Resolved ? ?Expected Discharge Plan and Services ?Expected Discharge Plan: [place] ?In-house Referral: Clinical Social Work ?  ?  ?Living arrangements for the past 2 months: [place] ?                ?  ?  ?  ?  ?  ?HH Arranged: PT ?[place] Agency: [place] ?Date HH Agency Contacted: 12/09/21 ?Time [place]: [phone] ?Representative spoke with at [place]: [place] ? ? ?Social Determinants of Health (SDOH) Interventions ?  ? ?Readmission Risk Interventions ?No flowsheet data found. ? ?

## 2021-12-14 NOTE — Progress Notes (Signed)
Physician Discharge Summary      Patient ID: [name] MRN: [id number] DOB/AGE: December 23, 1950 71 y.o.  Admit date: 12/08/2021 Discharge date: 12/14/2021  Admission Diagnoses:  Principal Problem:   S/P total knee arthroplasty, right   Discharge Diagnoses:  Same  Surgeries: Procedure(s): RIGHT TOTAL KNEE ARTHROPLASTY APPLICATION OF WOUND VAC on 12/08/2021   Consultants:   Discharged Condition: Stable  Hospital Course: [name] is an 71 y.o. female who was admitted 12/08/2021 with a chief complaint of No chief complaint on file. , and found to have a diagnosis of S/P total knee arthroplasty, right.  They were brought to the operating room on 12/08/2021 and underwent the above named procedures.    Antibiotics given:  Anti-infectives (From admission, onward)    Start     Dose/Rate Route Frequency Ordered Stop   12/08/21 0833  vancomycin (VANCOCIN) powder  Status:  Discontinued          As needed 12/08/21 0833 12/08/21 1054   12/08/21 0600  ceFAZolin (ANCEF) IVPB 2g/100 mL premix        2 g 200 mL/hr over 30 Minutes Intravenous On call to O.R. 12/08/21 7829 12/08/21 0749     .  Recent vital signs:  Vitals:   12/14/21 0734 12/14/21 0802  BP:  (!) 152/66  Pulse:  (!) 113  Resp:  18  Temp:  98.7 F (37.1 C)  SpO2: 98% 96%    Recent laboratory studies:  Results for orders placed or performed during the hospital encounter of 12/08/21  Anaerobic culture w Gram Stain   Specimen: Urine, In & Out Cath; Urinary  Result Value Ref Range   Specimen Description IN/OUT CATH URINE    Special Requests CATH URINE SPEC A    Gram Stain      WBC PRESENT, PREDOMINANTLY MONONUCLEAR NO ORGANISMS SEEN CYTOSPIN SMEAR    Culture      RARE GRAM NEGATIVE RODS IDENTIFICATION AND SUSCEPTIBILITIES TO FOLLOW Performed at [facility], [address] [place], [place] [address]    Report Status PENDING   Urine Culture   Specimen: Urine, Catheterized  Result Value Ref Range    Specimen Description IN/OUT CATH URINE    Special Requests CATH URINE SPEC A    Culture      NO GROWTH Performed at [facility], [address] [place], [place] [address]    Report Status 12/09/2021 FINAL   Urinalysis, Routine w reflex microscopic Urine, Catheterized  Result Value Ref Range   Color, Urine YELLOW YELLOW   APPearance HAZY (A) CLEAR   Specific Gravity, Urine 1.023 1.005 - 1.030   pH 5.0 5.0 - 8.0   Glucose, UA NEGATIVE NEGATIVE mg/dL   Hgb urine dipstick NEGATIVE NEGATIVE   Bilirubin Urine NEGATIVE NEGATIVE   Ketones, ur NEGATIVE NEGATIVE mg/dL   Protein, ur NEGATIVE NEGATIVE mg/dL   Nitrite NEGATIVE NEGATIVE   Leukocytes,Ua NEGATIVE NEGATIVE  CBC  Result Value Ref Range   WBC 13.5 (H) 4.0 - 10.5 K/uL   RBC 3.20 (L) 3.87 - 5.11 MIL/uL   Hemoglobin 8.5 (L) 12.0 - 15.0 g/dL   HCT 84.6 (L) 96.2 - 95.2 %   MCV 85.3 80.0 - 100.0 fL   MCH 26.6 26.0 - 34.0 pg   MCHC 31.1 30.0 - 36.0 g/dL   RDW 84.1 (H) 32.4 - 40.1 %   Platelets 772 (H) 150 - 400 K/uL   nRBC 0.0 0.0 - 0.2 %  Basic  metabolic panel  Result Value Ref Range   Sodium 136 135 - 145 mmol/L   Potassium 3.3 (L) 3.5 - 5.1 mmol/L   Chloride 100 98 - 111 mmol/L   CO2 27 22 - 32 mmol/L   Glucose, Bld 127 (H) 70 - 99 mg/dL   BUN 7 (L) 8 - 23 mg/dL   Creatinine, Ser 1.61 0.44 - 1.00 mg/dL   Calcium 8.6 (L) 8.9 - 10.3 mg/dL   GFR, Estimated >09 >60 mL/min   Anion gap 9 5 - 15  CBC  Result Value Ref Range   WBC 15.5 (H) 4.0 - 10.5 K/uL   RBC 3.17 (L) 3.87 - 5.11 MIL/uL   Hemoglobin 8.4 (L) 12.0 - 15.0 g/dL   HCT 45.4 (L) 09.8 - 11.9 %   MCV 84.2 80.0 - 100.0 fL   MCH 26.5 26.0 - 34.0 pg   MCHC 31.5 30.0 - 36.0 g/dL   RDW 14.7 (H) 82.9 - 56.2 %   Platelets 741 (H) 150 - 400 K/uL   nRBC 0.0 0.0 - 0.2 %  Albumin  Result Value Ref Range   Albumin 2.1 (L) 3.5 - 5.0 g/dL  Prealbumin  Result Value Ref Range   Prealbumin 8.8 (L) 18 - 38 mg/dL  CBC  Result Value Ref Range   WBC 13.1 (H) 4.0 -  10.5 K/uL   RBC 3.04 (L) 3.87 - 5.11 MIL/uL   Hemoglobin 8.0 (L) 12.0 - 15.0 g/dL   HCT 13.0 (L) 86.5 - 78.4 %   MCV 85.9 80.0 - 100.0 fL   MCH 26.3 26.0 - 34.0 pg   MCHC 30.7 30.0 - 36.0 g/dL   RDW 69.6 (H) 29.5 - 28.4 %   Platelets 623 (H) 150 - 400 K/uL   nRBC 0.0 0.0 - 0.2 %    Discharge Medications:     Diagnostic Studies: VAS [name] LOWER EXTREMITY VENOUS (DVT)  Result Date: 12/11/2021  Lower Venous DVT Study Patient Name:  [name]  Date of Exam:   12/11/2021 Medical Rec #: [id number]         Accession #:    [id number] Date of Birth: 1950-10-27         Patient Gender: F Patient Age:   23 years Exam Location:  [facility] Procedure:      VAS [name] LOWER EXTREMITY VENOUS (DVT) Referring Phys: Rise Paganini --------------------------------------------------------------------------------  Indications: Edema, s/p RT knee surgery.  Comparison Study: No prior studies. Performing Technologist: Jean Rosenthal RDMS, RVT  Examination Guidelines: A complete evaluation includes B-mode imaging, spectral Doppler, color Doppler, and power Doppler as needed of all accessible portions of each vessel. Bilateral testing is considered an integral part of a complete examination. Limited examinations for reoccurring indications may be performed as noted. The reflux portion of the exam is performed with the patient in reverse Trendelenburg.  +--------+---------------+---------+-----------+----------+--------------------+ RIGHT   CompressibilityPhasicitySpontaneityPropertiesThrombus Aging       +--------+---------------+---------+-----------+----------+--------------------+ CFV     Full           Yes      Yes                                       +--------+---------------+---------+-----------+----------+--------------------+ SFJ     Full                                                              +--------+---------------+---------+-----------+----------+--------------------+  FV Prox Full                                                               +--------+---------------+---------+-----------+----------+--------------------+ FV Mid  Full                                                              +--------+---------------+---------+-----------+----------+--------------------+ FV      Full                                         Duplicated- both     Distal                                               patent               +--------+---------------+---------+-----------+----------+--------------------+ PFV     Full                                                              +--------+---------------+---------+-----------+----------+--------------------+ POP     Full           Yes      Yes                  Duplicated- both                                                          patent               +--------+---------------+---------+-----------+----------+--------------------+ PTV     Full                                                              +--------+---------------+---------+-----------+----------+--------------------+ PERO    Full                                                              +--------+---------------+---------+-----------+----------+--------------------+   +---------+---------------+---------+-----------+----------+--------------+ LEFT     CompressibilityPhasicitySpontaneityPropertiesThrombus Aging +---------+---------------+---------+-----------+----------+--------------+ CFV      Full           Yes  Yes                                 +---------+---------------+---------+-----------+----------+--------------+ SFJ      Full                                                        +---------+---------------+---------+-----------+----------+--------------+ FV Prox  Full                                                         +---------+---------------+---------+-----------+----------+--------------+ FV Mid   Full                                                        +---------+---------------+---------+-----------+----------+--------------+ FV DistalFull                                                        +---------+---------------+---------+-----------+----------+--------------+ PFV      Full                                                        +---------+---------------+---------+-----------+----------+--------------+ POP      Full           Yes      Yes                                 +---------+---------------+---------+-----------+----------+--------------+ PTV      Full                                                        +---------+---------------+---------+-----------+----------+--------------+ PERO     Full                                                        +---------+---------------+---------+-----------+----------+--------------+     Summary: RIGHT: - There is no evidence of deep vein thrombosis in the lower extremity.  - No cystic structure found in the popliteal fossa.  LEFT: - There is no evidence of deep vein thrombosis in the lower extremity.  - An irregular echogenic structure with heavy posterior acoustic shadowing is visualized in the popliteal fossa. Not well characterized on ultrasound. Patient notes significant pain in this area.  *See table(s) above for  measurements and observations. Electronically signed by Lemar Livings MD on 12/11/2021 at 4:18:16 PM.    Final     Disposition:      Follow-up Information     [facility].   Specialty: Home Health Services Why: home health services will be provided by [facility], start of care within 48 hours post discharge Contact information: [address] [place] [address] [phone]                  Signed: Burnard Bunting 12/14/2021, 10:52 AM

## 2021-12-14 NOTE — Plan of Care (Signed)

## 2021-12-15 ENCOUNTER — Telehealth: Payer: Self-pay | Admitting: *Deleted

## 2021-12-15 LAB — ANAEROBIC CULTURE W GRAM STAIN

## 2021-12-15 NOTE — Telephone Encounter (Signed)
Attempted Ortho bundle D/C call to patient. No answer and unable to leave VM. Left VM with SW at [facility] requesting call back to discuss discharge goals and plan.  ?

## 2021-12-15 NOTE — Care Plan (Signed)
Discharge and 7 day call combined due to prolonged stay in hospital. ?

## 2021-12-16 ENCOUNTER — Telehealth: Payer: Self-pay | Admitting: *Deleted

## 2021-12-16 DIAGNOSIS — F331 Major depressive disorder, recurrent, moderate: Secondary | ICD-10-CM | POA: Diagnosis not present

## 2021-12-16 DIAGNOSIS — M170 Bilateral primary osteoarthritis of knee: Secondary | ICD-10-CM | POA: Diagnosis not present

## 2021-12-16 DIAGNOSIS — J4521 Mild intermittent asthma with (acute) exacerbation: Secondary | ICD-10-CM | POA: Diagnosis not present

## 2021-12-16 NOTE — Telephone Encounter (Signed)
RNCM call to [facility] and spoke with on site RNColletta Maryland. Informed that patient had UA done at hospital prior to discharge. UA done on 12/08/21. Dr. Marlou Sa received results that it grew pseudomonas aeruginosa and is sensitive to Ciprofloxacin. Colletta Maryland informed she would update their NP on site and get her to write order for new Rx. Also they have access to Epic lab results and will pull this up for their records as well.  ?

## 2021-12-19 DIAGNOSIS — J4521 Mild intermittent asthma with (acute) exacerbation: Secondary | ICD-10-CM | POA: Diagnosis not present

## 2021-12-19 DIAGNOSIS — F331 Major depressive disorder, recurrent, moderate: Secondary | ICD-10-CM | POA: Diagnosis not present

## 2021-12-19 DIAGNOSIS — M170 Bilateral primary osteoarthritis of knee: Secondary | ICD-10-CM | POA: Diagnosis not present

## 2021-12-21 DIAGNOSIS — M170 Bilateral primary osteoarthritis of knee: Secondary | ICD-10-CM | POA: Diagnosis not present

## 2021-12-21 DIAGNOSIS — J4521 Mild intermittent asthma with (acute) exacerbation: Secondary | ICD-10-CM | POA: Diagnosis not present

## 2021-12-21 DIAGNOSIS — F331 Major depressive disorder, recurrent, moderate: Secondary | ICD-10-CM | POA: Diagnosis not present

## 2021-12-22 DIAGNOSIS — M818 Other osteoporosis without current pathological fracture: Secondary | ICD-10-CM | POA: Diagnosis not present

## 2021-12-22 DIAGNOSIS — J45909 Unspecified asthma, uncomplicated: Secondary | ICD-10-CM | POA: Diagnosis not present

## 2021-12-22 DIAGNOSIS — Z96651 Presence of right artificial knee joint: Secondary | ICD-10-CM | POA: Diagnosis not present

## 2021-12-22 DIAGNOSIS — D869 Sarcoidosis, unspecified: Secondary | ICD-10-CM | POA: Diagnosis not present

## 2021-12-22 DIAGNOSIS — F330 Major depressive disorder, recurrent, mild: Secondary | ICD-10-CM | POA: Diagnosis not present

## 2021-12-22 DIAGNOSIS — F419 Anxiety disorder, unspecified: Secondary | ICD-10-CM | POA: Diagnosis not present

## 2021-12-22 DIAGNOSIS — Z4789 Encounter for other orthopedic aftercare: Secondary | ICD-10-CM | POA: Diagnosis not present

## 2021-12-22 DIAGNOSIS — H4089 Other specified glaucoma: Secondary | ICD-10-CM | POA: Diagnosis not present

## 2021-12-22 DIAGNOSIS — M1711 Unilateral primary osteoarthritis, right knee: Secondary | ICD-10-CM | POA: Diagnosis not present

## 2021-12-23 ENCOUNTER — Encounter: Payer: Self-pay | Admitting: Orthopedic Surgery

## 2021-12-23 ENCOUNTER — Ambulatory Visit (INDEPENDENT_AMBULATORY_CARE_PROVIDER_SITE_OTHER): Payer: Medicare PPO | Admitting: Orthopedic Surgery

## 2021-12-23 ENCOUNTER — Other Ambulatory Visit: Payer: Self-pay

## 2021-12-23 ENCOUNTER — Telehealth: Payer: Self-pay | Admitting: *Deleted

## 2021-12-23 ENCOUNTER — Ambulatory Visit (INDEPENDENT_AMBULATORY_CARE_PROVIDER_SITE_OTHER): Payer: Medicare PPO

## 2021-12-23 DIAGNOSIS — Z96651 Presence of right artificial knee joint: Secondary | ICD-10-CM

## 2021-12-23 NOTE — Progress Notes (Signed)
? ?Post-Op Visit Note ?  ?Patient: [name]           ?Date of Birth: 1951/01/12           ?MRN: [id number] ?Visit Date: 12/23/2021 ?PCP: Victoria Ruddy, MD ? ? ?Assessment & Plan: ? ?Chief Complaint:  ?Chief Complaint  ?Patient presents with  ? Right Knee - Routine Post Op  ? ?Visit Diagnoses:  ?1. Status post total right knee replacement   ? ? ?Plan: [name] is a 71 year old patient is now 2 weeks out right total knee replacement.  She is at skilled nursing.  70 degrees on CPM.  Range of motion is about 10-75.  No calf tenderness negative Homans.  Incision intact.  Left knee is causing a lot of pain.  Her standing and walking endurance is diminished.  Plan is to really focus more on quad strengthening with TENS unit application to activate the quad bilaterally.  She is very deconditioned.  Pain medicine is working but she has been somewhat of a predicament in terms of timetable to be well enough to resume independent living.  She will have to get a lot stronger.  Follow-up in 2 weeks for clinical recheck.  May consider Toradol injection at that time into the left knee for some pain relief.  Hesitate to be cortisone at this time similarly after surgery.  And we are talking about the arthritic left knee with the injection. ? ?Follow-Up Instructions: Return in about 2 weeks (around 01/06/2022).  ? ?Orders:  ?Orders Placed This Encounter  ?Procedures  ? XR Knee 1-2 Views Right  ? ?No orders of the defined types were placed in this encounter. ? ? ?Imaging: ?XR Knee 1-2 Views Right ? ?Result Date: 12/23/2021 ?AP lateral radiographs right knee reviewed.  Total knee prosthesis with stemmed tibia in good position alignment with no complicating features.  ? ?PMFS History: ?Patient Active Problem List  ? Diagnosis Date Noted  ? S/P total knee arthroplasty, right 12/08/2021  ? Pressure injury of skin 08/27/2021  ? Fall 08/23/2021  ? Difficulty in walking 08/23/2021  ? Bilateral lower extremity edema 08/23/2021  ? Hormone  replacement therapy 08/23/2021  ? Osteoporosis 01/28/2021  ? Osteoarthritis of right wrist 01/28/2021  ? 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  ? Foot pain 09/13/2020  ? Knee pain 08/24/2018  ? Fall in home 07/14/2015  ? TIBIALIS TENDINITIS 08/25/2009  ? CHEST PAIN, OTHER, PAIN 10/21/2008  ? PEDAL EDEMA 07/22/2008  ? ANKLE PAIN, BILATERAL 04/01/2008  ? LOW BACK PAIN 04/01/2008  ? ACUTE BRONCHITIS 09/23/2007  ? SARCOIDOSIS 09/02/2007  ? Asthma 09/02/2007  ? Diverticulosis of colon 07/11/2007  ? BACK PAIN, CHRONIC 07/11/2007  ? ?Past Medical History:  ?Diagnosis Date  ? Acute bronchitis   ? Allergy   ? Anxiety   ? Arthritis   ? knees  ? Asthma   ? Backache, unspecified   ? Cataract   ? removed both eyes  ? Depression   ? Diverticulitis   ? Diverticulosis of colon (without mention of hemorrhage)   ? Edema   ? Glaucoma   ? Lumbago   ? Obesity   ? Other chest pain   ? Pain in joint, ankle and foot   ? Sarcoidosis   ? Tibialis tendinitis   ? Unspecified asthma(493.90)   ?  ?Family History  ?Problem Relation Age of Onset  ? Hypertension Mother   ? Stroke Mother   ? Diabetes Father   ? Sarcoidosis Sister   ?  Colon cancer Neg Hx   ? Colon polyps Neg Hx   ? Esophageal cancer Neg Hx   ? Rectal cancer Neg Hx   ? Stomach cancer Neg Hx   ?  ?Past Surgical History:  ?Procedure Laterality Date  ? ABDOMINAL HYSTERECTOMY    ? CATARACT EXTRACTION W/PHACO Right 06/24/2014  ? Procedure: CATARACT EXTRACTION PHACO AND INTRAOCULAR LENS PLACEMENT (IOC) RIGHT EYE WITH GONIOSYNECHIALYSIS;  Surgeon: Marylynn Pearson, MD;  Location: [place];  Service: Ophthalmology;  Laterality: Right;  ? COLONOSCOPY    ? EYE SURGERY Left   ? cataract surgery  ? HAND SURGERY Right   ? trigger finger release  ? IR RADIOLOGIST EVAL & MGMT  11/14/2018  ? KNEE ARTHROSCOPY Right   ? TOTAL KNEE ARTHROPLASTY Right 12/08/2021  ? Procedure: RIGHT TOTAL KNEE ARTHROPLASTY APPLICATION OF WOUND VAC;  Surgeon: Meredith Pel, MD;  Location: [place];  Service: Orthopedics;  Laterality:  Right;  ? ?Social History  ? ?Occupational History  ? Not on file  ?Tobacco Use  ? Smoking status: Never  ? Smokeless tobacco: Never  ?Vaping Use  ? Vaping Use: Never used  ?Substance and Sexual Activity  ? Alcohol use: Yes  ?  Comment: 1 glass of wine or mixed drink once a week  ? Drug use: No  ? Sexual activity: Not on file  ? ? ? ?

## 2021-12-23 NOTE — Telephone Encounter (Signed)
Ortho bundle 14 day in office meeting completed. °

## 2021-12-24 DIAGNOSIS — M1711 Unilateral primary osteoarthritis, right knee: Secondary | ICD-10-CM

## 2021-12-26 DIAGNOSIS — M170 Bilateral primary osteoarthritis of knee: Secondary | ICD-10-CM | POA: Diagnosis not present

## 2021-12-26 DIAGNOSIS — F331 Major depressive disorder, recurrent, moderate: Secondary | ICD-10-CM | POA: Diagnosis not present

## 2021-12-26 DIAGNOSIS — J4521 Mild intermittent asthma with (acute) exacerbation: Secondary | ICD-10-CM | POA: Diagnosis not present

## 2021-12-28 DIAGNOSIS — M170 Bilateral primary osteoarthritis of knee: Secondary | ICD-10-CM | POA: Diagnosis not present

## 2021-12-28 DIAGNOSIS — J4521 Mild intermittent asthma with (acute) exacerbation: Secondary | ICD-10-CM | POA: Diagnosis not present

## 2021-12-28 DIAGNOSIS — D649 Anemia, unspecified: Secondary | ICD-10-CM | POA: Diagnosis not present

## 2021-12-28 DIAGNOSIS — F331 Major depressive disorder, recurrent, moderate: Secondary | ICD-10-CM | POA: Diagnosis not present

## 2021-12-29 ENCOUNTER — Encounter: Payer: Self-pay | Admitting: Orthopedic Surgery

## 2021-12-29 ENCOUNTER — Ambulatory Visit (INDEPENDENT_AMBULATORY_CARE_PROVIDER_SITE_OTHER): Payer: Medicare PPO

## 2021-12-29 ENCOUNTER — Ambulatory Visit (INDEPENDENT_AMBULATORY_CARE_PROVIDER_SITE_OTHER): Payer: Medicare PPO | Admitting: Orthopedic Surgery

## 2021-12-29 DIAGNOSIS — M25562 Pain in left knee: Secondary | ICD-10-CM | POA: Diagnosis not present

## 2021-12-29 NOTE — Progress Notes (Signed)
? ?Post-Op Visit Note ?  ?Patient: [name]           ?Date of Birth: 1950-10-04           ?MRN: [id number] ?Visit Date: 12/29/2021 ?PCP: [name], MD ? ? ?Assessment & Plan: ? ?Chief Complaint:  ?Chief Complaint  ?Patient presents with  ? Left Knee - Pain  ? ?Visit Diagnoses:  ?1. Left knee pain, unspecified chronicity   ? ? ?Plan: Xiana is a 71 year old patient is doing well following right total knee replacement.  She does have deconditioning and some quad weakness but on exam today she is able to do a straight leg raise with the right side.  Flexion is easily to 90.  Still lacks about 10 degrees of full extension.  Encouraged her to keep a pillow under the heel and work on quad firing as well as getting that right leg straight.  Left knee is the more symptomatic knee at this time.  She was brought here today to have that evaluated because of some warmth around the knee.  On examination she has painful range of motion.  Radiographs show significant erosive arthritis with joint space obliteration essentially in all 3 compartments.  This is going to be a difficult rehabilitative process based on the severity of the left knee arthritis.  Right knee overall is doing well.  Best option for her in terms of regaining independence is to focus on the right leg to bear most of the weight because of the severity of the arthritis in the left knee.  Keep follow-up appointment in 2 weeks.  On the right left-hand side negative Homans and no calf tenderness today. ? ?Follow-Up Instructions: No follow-ups on file.  ? ?Orders:  ?Orders Placed This Encounter  ?Procedures  ? XR Knee 1-2 Views Left  ? ?No orders of the defined types were placed in this encounter. ? ? ?Imaging: ?XR Knee 1-2 Views Left ? ?Result Date: 12/29/2021 ?AP lateral radiographs left knee reviewed.  Severe end-stage left knee arthritis is present with no acute fracture.  Erosive changes noted in all 3 compartments.  Arthritis has progressed  compared to 1 year ago.  ? ?PMFS History: ?Patient Active Problem List  ? Diagnosis Date Noted  ? Arthritis of right knee   ? S/P total knee arthroplasty, right 12/08/2021  ? Pressure injury of skin 08/27/2021  ? Fall 08/23/2021  ? Difficulty in walking 08/23/2021  ? Bilateral lower extremity edema 08/23/2021  ? Hormone replacement therapy 08/23/2021  ? Osteoporosis 01/28/2021  ? Osteoarthritis of right wrist 01/28/2021  ? 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  ? Foot pain 09/13/2020  ? Knee pain 08/24/2018  ? Fall in home 07/14/2015  ? TIBIALIS TENDINITIS 08/25/2009  ? CHEST PAIN, OTHER, PAIN 10/21/2008  ? PEDAL EDEMA 07/22/2008  ? ANKLE PAIN, BILATERAL 04/01/2008  ? LOW BACK PAIN 04/01/2008  ? ACUTE BRONCHITIS 09/23/2007  ? SARCOIDOSIS 09/02/2007  ? Asthma 09/02/2007  ? Diverticulosis of colon 07/11/2007  ? BACK PAIN, CHRONIC 07/11/2007  ? ?Past Medical History:  ?Diagnosis Date  ? Acute bronchitis   ? Allergy   ? Anxiety   ? Arthritis   ? knees  ? Asthma   ? Backache, unspecified   ? Cataract   ? removed both eyes  ? Depression   ? Diverticulitis   ? Diverticulosis of colon (without mention of hemorrhage)   ? Edema   ? Glaucoma   ? Lumbago   ? Obesity   ?  Other chest pain   ? Pain in joint, ankle and foot   ? Sarcoidosis   ? Tibialis tendinitis   ? Unspecified asthma(493.90)   ?  ?Family History  ?Problem Relation Age of Onset  ? Hypertension Mother   ? Stroke Mother   ? Diabetes Father   ? Sarcoidosis Sister   ? Colon cancer Neg Hx   ? Colon polyps Neg Hx   ? Esophageal cancer Neg Hx   ? Rectal cancer Neg Hx   ? Stomach cancer Neg Hx   ?  ?Past Surgical History:  ?Procedure Laterality Date  ? ABDOMINAL HYSTERECTOMY    ? CATARACT EXTRACTION W/PHACO Right 06/24/2014  ? Procedure: CATARACT EXTRACTION PHACO AND INTRAOCULAR LENS PLACEMENT (IOC) RIGHT EYE WITH GONIOSYNECHIALYSIS;  Surgeon: Marylynn Pearson, MD;  Location: [place];  Service: Ophthalmology;  Laterality: Right;  ? COLONOSCOPY    ? EYE SURGERY Left   ? cataract  surgery  ? HAND SURGERY Right   ? trigger finger release  ? IR RADIOLOGIST EVAL & MGMT  11/14/2018  ? KNEE ARTHROSCOPY Right   ? TOTAL KNEE ARTHROPLASTY Right 12/08/2021  ? Procedure: RIGHT TOTAL KNEE ARTHROPLASTY APPLICATION OF WOUND VAC;  Surgeon: Meredith Pel, MD;  Location: [place];  Service: Orthopedics;  Laterality: Right;  ? ?Social History  ? ?Occupational History  ? Not on file  ?Tobacco Use  ? Smoking status: Never  ? Smokeless tobacco: Never  ?Vaping Use  ? Vaping Use: Never used  ?Substance and Sexual Activity  ? Alcohol use: Yes  ?  Comment: 1 glass of wine or mixed drink once a week  ? Drug use: No  ? Sexual activity: Not on file  ? ? ? ?

## 2022-01-02 DIAGNOSIS — J4521 Mild intermittent asthma with (acute) exacerbation: Secondary | ICD-10-CM | POA: Diagnosis not present

## 2022-01-02 DIAGNOSIS — D649 Anemia, unspecified: Secondary | ICD-10-CM | POA: Diagnosis not present

## 2022-01-02 DIAGNOSIS — F331 Major depressive disorder, recurrent, moderate: Secondary | ICD-10-CM | POA: Diagnosis not present

## 2022-01-02 DIAGNOSIS — M170 Bilateral primary osteoarthritis of knee: Secondary | ICD-10-CM | POA: Diagnosis not present

## 2022-01-17 DIAGNOSIS — J4521 Mild intermittent asthma with (acute) exacerbation: Secondary | ICD-10-CM | POA: Diagnosis not present

## 2022-01-17 DIAGNOSIS — F331 Major depressive disorder, recurrent, moderate: Secondary | ICD-10-CM | POA: Diagnosis not present

## 2022-01-17 DIAGNOSIS — D649 Anemia, unspecified: Secondary | ICD-10-CM | POA: Diagnosis not present

## 2022-01-17 DIAGNOSIS — M170 Bilateral primary osteoarthritis of knee: Secondary | ICD-10-CM | POA: Diagnosis not present

## 2022-01-20 ENCOUNTER — Telehealth: Payer: Self-pay | Admitting: Orthopedic Surgery

## 2022-01-20 ENCOUNTER — Ambulatory Visit: Payer: Medicare PPO | Admitting: Orthopedic Surgery

## 2022-01-20 NOTE — Telephone Encounter (Signed)
Called pt to let her know that Dr.Dean is unavailable to be worked in today and she will just have to follow up next week.  ?

## 2022-01-22 DIAGNOSIS — R262 Difficulty in walking, not elsewhere classified: Secondary | ICD-10-CM | POA: Diagnosis not present

## 2022-01-22 DIAGNOSIS — Z4789 Encounter for other orthopedic aftercare: Secondary | ICD-10-CM | POA: Diagnosis not present

## 2022-01-22 DIAGNOSIS — M6281 Muscle weakness (generalized): Secondary | ICD-10-CM | POA: Diagnosis not present

## 2022-01-22 DIAGNOSIS — Z471 Aftercare following joint replacement surgery: Secondary | ICD-10-CM | POA: Diagnosis not present

## 2022-01-22 DIAGNOSIS — M6250 Muscle wasting and atrophy, not elsewhere classified, unspecified site: Secondary | ICD-10-CM | POA: Diagnosis not present

## 2022-01-23 DIAGNOSIS — M6250 Muscle wasting and atrophy, not elsewhere classified, unspecified site: Secondary | ICD-10-CM | POA: Diagnosis not present

## 2022-01-23 DIAGNOSIS — M6281 Muscle weakness (generalized): Secondary | ICD-10-CM | POA: Diagnosis not present

## 2022-01-23 DIAGNOSIS — R262 Difficulty in walking, not elsewhere classified: Secondary | ICD-10-CM | POA: Diagnosis not present

## 2022-01-23 DIAGNOSIS — Z4789 Encounter for other orthopedic aftercare: Secondary | ICD-10-CM | POA: Diagnosis not present

## 2022-01-23 DIAGNOSIS — Z471 Aftercare following joint replacement surgery: Secondary | ICD-10-CM | POA: Diagnosis not present

## 2022-01-24 DIAGNOSIS — M6281 Muscle weakness (generalized): Secondary | ICD-10-CM | POA: Diagnosis not present

## 2022-01-24 DIAGNOSIS — M6250 Muscle wasting and atrophy, not elsewhere classified, unspecified site: Secondary | ICD-10-CM | POA: Diagnosis not present

## 2022-01-24 DIAGNOSIS — Z471 Aftercare following joint replacement surgery: Secondary | ICD-10-CM | POA: Diagnosis not present

## 2022-01-24 DIAGNOSIS — Z4789 Encounter for other orthopedic aftercare: Secondary | ICD-10-CM | POA: Diagnosis not present

## 2022-01-24 DIAGNOSIS — R262 Difficulty in walking, not elsewhere classified: Secondary | ICD-10-CM | POA: Diagnosis not present

## 2022-01-25 ENCOUNTER — Ambulatory Visit: Payer: Medicare PPO | Admitting: Surgical

## 2022-01-25 ENCOUNTER — Telehealth: Payer: Self-pay

## 2022-01-25 ENCOUNTER — Ambulatory Visit: Payer: Medicare PPO

## 2022-01-25 DIAGNOSIS — Z4789 Encounter for other orthopedic aftercare: Secondary | ICD-10-CM | POA: Diagnosis not present

## 2022-01-25 DIAGNOSIS — M6281 Muscle weakness (generalized): Secondary | ICD-10-CM | POA: Diagnosis not present

## 2022-01-25 DIAGNOSIS — M6250 Muscle wasting and atrophy, not elsewhere classified, unspecified site: Secondary | ICD-10-CM | POA: Diagnosis not present

## 2022-01-25 DIAGNOSIS — Z471 Aftercare following joint replacement surgery: Secondary | ICD-10-CM | POA: Diagnosis not present

## 2022-01-25 DIAGNOSIS — R262 Difficulty in walking, not elsewhere classified: Secondary | ICD-10-CM | POA: Diagnosis not present

## 2022-01-25 DIAGNOSIS — M170 Bilateral primary osteoarthritis of knee: Secondary | ICD-10-CM | POA: Diagnosis not present

## 2022-01-25 NOTE — Telephone Encounter (Signed)
Unsuccessful attempt x3 to contact patient on preferred number listed in notes for scheduled AWV. Unable to leave message on voicemail. ?

## 2022-01-26 DIAGNOSIS — M6281 Muscle weakness (generalized): Secondary | ICD-10-CM | POA: Diagnosis not present

## 2022-01-26 DIAGNOSIS — R262 Difficulty in walking, not elsewhere classified: Secondary | ICD-10-CM | POA: Diagnosis not present

## 2022-01-26 DIAGNOSIS — M6250 Muscle wasting and atrophy, not elsewhere classified, unspecified site: Secondary | ICD-10-CM | POA: Diagnosis not present

## 2022-01-26 DIAGNOSIS — Z4789 Encounter for other orthopedic aftercare: Secondary | ICD-10-CM | POA: Diagnosis not present

## 2022-01-26 DIAGNOSIS — Z471 Aftercare following joint replacement surgery: Secondary | ICD-10-CM | POA: Diagnosis not present

## 2022-01-27 DIAGNOSIS — Z471 Aftercare following joint replacement surgery: Secondary | ICD-10-CM | POA: Diagnosis not present

## 2022-01-27 DIAGNOSIS — M6281 Muscle weakness (generalized): Secondary | ICD-10-CM | POA: Diagnosis not present

## 2022-01-27 DIAGNOSIS — Z4789 Encounter for other orthopedic aftercare: Secondary | ICD-10-CM | POA: Diagnosis not present

## 2022-01-27 DIAGNOSIS — M6250 Muscle wasting and atrophy, not elsewhere classified, unspecified site: Secondary | ICD-10-CM | POA: Diagnosis not present

## 2022-01-27 DIAGNOSIS — R262 Difficulty in walking, not elsewhere classified: Secondary | ICD-10-CM | POA: Diagnosis not present

## 2022-01-29 DIAGNOSIS — R262 Difficulty in walking, not elsewhere classified: Secondary | ICD-10-CM | POA: Diagnosis not present

## 2022-01-29 DIAGNOSIS — Z471 Aftercare following joint replacement surgery: Secondary | ICD-10-CM | POA: Diagnosis not present

## 2022-01-29 DIAGNOSIS — Z4789 Encounter for other orthopedic aftercare: Secondary | ICD-10-CM | POA: Diagnosis not present

## 2022-01-29 DIAGNOSIS — M6250 Muscle wasting and atrophy, not elsewhere classified, unspecified site: Secondary | ICD-10-CM | POA: Diagnosis not present

## 2022-01-29 DIAGNOSIS — M6281 Muscle weakness (generalized): Secondary | ICD-10-CM | POA: Diagnosis not present

## 2022-01-30 ENCOUNTER — Telehealth: Payer: Self-pay | Admitting: Family Medicine

## 2022-01-30 DIAGNOSIS — M25562 Pain in left knee: Secondary | ICD-10-CM | POA: Diagnosis not present

## 2022-01-30 DIAGNOSIS — M170 Bilateral primary osteoarthritis of knee: Secondary | ICD-10-CM | POA: Diagnosis not present

## 2022-01-30 DIAGNOSIS — M6250 Muscle wasting and atrophy, not elsewhere classified, unspecified site: Secondary | ICD-10-CM | POA: Diagnosis not present

## 2022-01-30 DIAGNOSIS — F4322 Adjustment disorder with anxiety: Secondary | ICD-10-CM | POA: Diagnosis not present

## 2022-01-30 DIAGNOSIS — M6259 Muscle wasting and atrophy, not elsewhere classified, multiple sites: Secondary | ICD-10-CM | POA: Diagnosis not present

## 2022-01-30 DIAGNOSIS — R262 Difficulty in walking, not elsewhere classified: Secondary | ICD-10-CM | POA: Diagnosis not present

## 2022-01-30 DIAGNOSIS — M6281 Muscle weakness (generalized): Secondary | ICD-10-CM | POA: Diagnosis not present

## 2022-01-30 DIAGNOSIS — Z4789 Encounter for other orthopedic aftercare: Secondary | ICD-10-CM | POA: Diagnosis not present

## 2022-01-30 DIAGNOSIS — Z741 Need for assistance with personal care: Secondary | ICD-10-CM | POA: Diagnosis not present

## 2022-01-30 DIAGNOSIS — Z471 Aftercare following joint replacement surgery: Secondary | ICD-10-CM | POA: Diagnosis not present

## 2022-01-30 NOTE — Telephone Encounter (Signed)
Left message for patient to call back and schedule Medicare Annual Wellness Visit (AWV) either virtually or in office. Left  my jabber number [phone]   Last AWV ;01/19/21  please schedule at anytime with [facility] Nurse Health Advisor 1 or 2   

## 2022-01-31 DIAGNOSIS — Z741 Need for assistance with personal care: Secondary | ICD-10-CM | POA: Diagnosis not present

## 2022-01-31 DIAGNOSIS — R262 Difficulty in walking, not elsewhere classified: Secondary | ICD-10-CM | POA: Diagnosis not present

## 2022-01-31 DIAGNOSIS — M25562 Pain in left knee: Secondary | ICD-10-CM | POA: Diagnosis not present

## 2022-01-31 DIAGNOSIS — M6281 Muscle weakness (generalized): Secondary | ICD-10-CM | POA: Diagnosis not present

## 2022-01-31 DIAGNOSIS — Z4789 Encounter for other orthopedic aftercare: Secondary | ICD-10-CM | POA: Diagnosis not present

## 2022-01-31 DIAGNOSIS — Z471 Aftercare following joint replacement surgery: Secondary | ICD-10-CM | POA: Diagnosis not present

## 2022-01-31 DIAGNOSIS — M170 Bilateral primary osteoarthritis of knee: Secondary | ICD-10-CM | POA: Diagnosis not present

## 2022-01-31 DIAGNOSIS — M6250 Muscle wasting and atrophy, not elsewhere classified, unspecified site: Secondary | ICD-10-CM | POA: Diagnosis not present

## 2022-01-31 DIAGNOSIS — M6259 Muscle wasting and atrophy, not elsewhere classified, multiple sites: Secondary | ICD-10-CM | POA: Diagnosis not present

## 2022-02-01 DIAGNOSIS — M6250 Muscle wasting and atrophy, not elsewhere classified, unspecified site: Secondary | ICD-10-CM | POA: Diagnosis not present

## 2022-02-01 DIAGNOSIS — R262 Difficulty in walking, not elsewhere classified: Secondary | ICD-10-CM | POA: Diagnosis not present

## 2022-02-01 DIAGNOSIS — Z471 Aftercare following joint replacement surgery: Secondary | ICD-10-CM | POA: Diagnosis not present

## 2022-02-01 DIAGNOSIS — Z4789 Encounter for other orthopedic aftercare: Secondary | ICD-10-CM | POA: Diagnosis not present

## 2022-02-01 DIAGNOSIS — M170 Bilateral primary osteoarthritis of knee: Secondary | ICD-10-CM | POA: Diagnosis not present

## 2022-02-01 DIAGNOSIS — M6259 Muscle wasting and atrophy, not elsewhere classified, multiple sites: Secondary | ICD-10-CM | POA: Diagnosis not present

## 2022-02-01 DIAGNOSIS — M25562 Pain in left knee: Secondary | ICD-10-CM | POA: Diagnosis not present

## 2022-02-01 DIAGNOSIS — Z741 Need for assistance with personal care: Secondary | ICD-10-CM | POA: Diagnosis not present

## 2022-02-01 DIAGNOSIS — M6281 Muscle weakness (generalized): Secondary | ICD-10-CM | POA: Diagnosis not present

## 2022-02-02 DIAGNOSIS — M170 Bilateral primary osteoarthritis of knee: Secondary | ICD-10-CM | POA: Diagnosis not present

## 2022-02-02 DIAGNOSIS — M549 Dorsalgia, unspecified: Secondary | ICD-10-CM | POA: Diagnosis not present

## 2022-02-02 DIAGNOSIS — M6250 Muscle wasting and atrophy, not elsewhere classified, unspecified site: Secondary | ICD-10-CM | POA: Diagnosis not present

## 2022-02-02 DIAGNOSIS — R262 Difficulty in walking, not elsewhere classified: Secondary | ICD-10-CM | POA: Diagnosis not present

## 2022-02-02 DIAGNOSIS — K219 Gastro-esophageal reflux disease without esophagitis: Secondary | ICD-10-CM | POA: Diagnosis not present

## 2022-02-02 DIAGNOSIS — M25562 Pain in left knee: Secondary | ICD-10-CM | POA: Diagnosis not present

## 2022-02-02 DIAGNOSIS — M6281 Muscle weakness (generalized): Secondary | ICD-10-CM | POA: Diagnosis not present

## 2022-02-02 DIAGNOSIS — Z471 Aftercare following joint replacement surgery: Secondary | ICD-10-CM | POA: Diagnosis not present

## 2022-02-02 DIAGNOSIS — M6259 Muscle wasting and atrophy, not elsewhere classified, multiple sites: Secondary | ICD-10-CM | POA: Diagnosis not present

## 2022-02-02 DIAGNOSIS — Z741 Need for assistance with personal care: Secondary | ICD-10-CM | POA: Diagnosis not present

## 2022-02-02 DIAGNOSIS — Z4789 Encounter for other orthopedic aftercare: Secondary | ICD-10-CM | POA: Diagnosis not present

## 2022-02-03 DIAGNOSIS — M6281 Muscle weakness (generalized): Secondary | ICD-10-CM | POA: Diagnosis not present

## 2022-02-03 DIAGNOSIS — M6259 Muscle wasting and atrophy, not elsewhere classified, multiple sites: Secondary | ICD-10-CM | POA: Diagnosis not present

## 2022-02-03 DIAGNOSIS — K219 Gastro-esophageal reflux disease without esophagitis: Secondary | ICD-10-CM | POA: Diagnosis not present

## 2022-02-03 DIAGNOSIS — M170 Bilateral primary osteoarthritis of knee: Secondary | ICD-10-CM | POA: Diagnosis not present

## 2022-02-03 DIAGNOSIS — R262 Difficulty in walking, not elsewhere classified: Secondary | ICD-10-CM | POA: Diagnosis not present

## 2022-02-03 DIAGNOSIS — M549 Dorsalgia, unspecified: Secondary | ICD-10-CM | POA: Diagnosis not present

## 2022-02-03 DIAGNOSIS — M6250 Muscle wasting and atrophy, not elsewhere classified, unspecified site: Secondary | ICD-10-CM | POA: Diagnosis not present

## 2022-02-03 DIAGNOSIS — Z4789 Encounter for other orthopedic aftercare: Secondary | ICD-10-CM | POA: Diagnosis not present

## 2022-02-03 DIAGNOSIS — Z471 Aftercare following joint replacement surgery: Secondary | ICD-10-CM | POA: Diagnosis not present

## 2022-02-03 DIAGNOSIS — M25562 Pain in left knee: Secondary | ICD-10-CM | POA: Diagnosis not present

## 2022-02-03 DIAGNOSIS — Z741 Need for assistance with personal care: Secondary | ICD-10-CM | POA: Diagnosis not present

## 2022-02-04 DIAGNOSIS — Z471 Aftercare following joint replacement surgery: Secondary | ICD-10-CM | POA: Diagnosis not present

## 2022-02-04 DIAGNOSIS — M170 Bilateral primary osteoarthritis of knee: Secondary | ICD-10-CM | POA: Diagnosis not present

## 2022-02-04 DIAGNOSIS — M6250 Muscle wasting and atrophy, not elsewhere classified, unspecified site: Secondary | ICD-10-CM | POA: Diagnosis not present

## 2022-02-04 DIAGNOSIS — Z741 Need for assistance with personal care: Secondary | ICD-10-CM | POA: Diagnosis not present

## 2022-02-04 DIAGNOSIS — R262 Difficulty in walking, not elsewhere classified: Secondary | ICD-10-CM | POA: Diagnosis not present

## 2022-02-04 DIAGNOSIS — M6259 Muscle wasting and atrophy, not elsewhere classified, multiple sites: Secondary | ICD-10-CM | POA: Diagnosis not present

## 2022-02-04 DIAGNOSIS — Z4789 Encounter for other orthopedic aftercare: Secondary | ICD-10-CM | POA: Diagnosis not present

## 2022-02-04 DIAGNOSIS — M6281 Muscle weakness (generalized): Secondary | ICD-10-CM | POA: Diagnosis not present

## 2022-02-04 DIAGNOSIS — M25562 Pain in left knee: Secondary | ICD-10-CM | POA: Diagnosis not present

## 2022-02-05 DIAGNOSIS — Z4789 Encounter for other orthopedic aftercare: Secondary | ICD-10-CM | POA: Diagnosis not present

## 2022-02-05 DIAGNOSIS — R262 Difficulty in walking, not elsewhere classified: Secondary | ICD-10-CM | POA: Diagnosis not present

## 2022-02-05 DIAGNOSIS — M6250 Muscle wasting and atrophy, not elsewhere classified, unspecified site: Secondary | ICD-10-CM | POA: Diagnosis not present

## 2022-02-05 DIAGNOSIS — M6259 Muscle wasting and atrophy, not elsewhere classified, multiple sites: Secondary | ICD-10-CM | POA: Diagnosis not present

## 2022-02-05 DIAGNOSIS — Z471 Aftercare following joint replacement surgery: Secondary | ICD-10-CM | POA: Diagnosis not present

## 2022-02-05 DIAGNOSIS — Z741 Need for assistance with personal care: Secondary | ICD-10-CM | POA: Diagnosis not present

## 2022-02-05 DIAGNOSIS — M6281 Muscle weakness (generalized): Secondary | ICD-10-CM | POA: Diagnosis not present

## 2022-02-05 DIAGNOSIS — M25562 Pain in left knee: Secondary | ICD-10-CM | POA: Diagnosis not present

## 2022-02-05 DIAGNOSIS — M170 Bilateral primary osteoarthritis of knee: Secondary | ICD-10-CM | POA: Diagnosis not present

## 2022-02-07 DIAGNOSIS — M170 Bilateral primary osteoarthritis of knee: Secondary | ICD-10-CM | POA: Diagnosis not present

## 2022-02-07 DIAGNOSIS — K219 Gastro-esophageal reflux disease without esophagitis: Secondary | ICD-10-CM | POA: Diagnosis not present

## 2022-02-07 DIAGNOSIS — Z471 Aftercare following joint replacement surgery: Secondary | ICD-10-CM | POA: Diagnosis not present

## 2022-02-07 DIAGNOSIS — M549 Dorsalgia, unspecified: Secondary | ICD-10-CM | POA: Diagnosis not present

## 2022-02-08 DIAGNOSIS — Z471 Aftercare following joint replacement surgery: Secondary | ICD-10-CM | POA: Diagnosis not present

## 2022-02-08 DIAGNOSIS — M170 Bilateral primary osteoarthritis of knee: Secondary | ICD-10-CM | POA: Diagnosis not present

## 2022-02-08 DIAGNOSIS — K219 Gastro-esophageal reflux disease without esophagitis: Secondary | ICD-10-CM | POA: Diagnosis not present

## 2022-02-08 DIAGNOSIS — M549 Dorsalgia, unspecified: Secondary | ICD-10-CM | POA: Diagnosis not present

## 2022-02-10 ENCOUNTER — Telehealth: Payer: Self-pay | Admitting: Orthopedic Surgery

## 2022-02-10 DIAGNOSIS — M170 Bilateral primary osteoarthritis of knee: Secondary | ICD-10-CM | POA: Diagnosis not present

## 2022-02-10 DIAGNOSIS — M6250 Muscle wasting and atrophy, not elsewhere classified, unspecified site: Secondary | ICD-10-CM | POA: Diagnosis not present

## 2022-02-10 DIAGNOSIS — M25562 Pain in left knee: Secondary | ICD-10-CM | POA: Diagnosis not present

## 2022-02-10 DIAGNOSIS — M6281 Muscle weakness (generalized): Secondary | ICD-10-CM | POA: Diagnosis not present

## 2022-02-10 DIAGNOSIS — Z741 Need for assistance with personal care: Secondary | ICD-10-CM | POA: Diagnosis not present

## 2022-02-10 DIAGNOSIS — Z471 Aftercare following joint replacement surgery: Secondary | ICD-10-CM | POA: Diagnosis not present

## 2022-02-10 DIAGNOSIS — M6259 Muscle wasting and atrophy, not elsewhere classified, multiple sites: Secondary | ICD-10-CM | POA: Diagnosis not present

## 2022-02-10 DIAGNOSIS — Z4789 Encounter for other orthopedic aftercare: Secondary | ICD-10-CM | POA: Diagnosis not present

## 2022-02-10 DIAGNOSIS — R262 Difficulty in walking, not elsewhere classified: Secondary | ICD-10-CM | POA: Diagnosis not present

## 2022-02-10 NOTE — Telephone Encounter (Signed)
Patient called wanting to hold a surgery date for left total knee.  She states she needs to make the necessary arrangements in regards to her living arrangement.  She is currently at [facility] and has reached maximum improvement with her right knee but still unable to live on her on until she takes care of the left knee (LEFT TOTAL KNEE).  Patient was reminded she has an appointment with our office on 03-01-22 and would be able to discuss this at the time of her visit, however she feels time is of essence and any consideration for providing her with a date (or a tentative date) would be appreciated.   ?

## 2022-02-11 DIAGNOSIS — M25562 Pain in left knee: Secondary | ICD-10-CM | POA: Diagnosis not present

## 2022-02-11 DIAGNOSIS — M170 Bilateral primary osteoarthritis of knee: Secondary | ICD-10-CM | POA: Diagnosis not present

## 2022-02-11 DIAGNOSIS — M6281 Muscle weakness (generalized): Secondary | ICD-10-CM | POA: Diagnosis not present

## 2022-02-11 DIAGNOSIS — R262 Difficulty in walking, not elsewhere classified: Secondary | ICD-10-CM | POA: Diagnosis not present

## 2022-02-11 DIAGNOSIS — M6259 Muscle wasting and atrophy, not elsewhere classified, multiple sites: Secondary | ICD-10-CM | POA: Diagnosis not present

## 2022-02-11 DIAGNOSIS — M6250 Muscle wasting and atrophy, not elsewhere classified, unspecified site: Secondary | ICD-10-CM | POA: Diagnosis not present

## 2022-02-11 DIAGNOSIS — Z741 Need for assistance with personal care: Secondary | ICD-10-CM | POA: Diagnosis not present

## 2022-02-11 DIAGNOSIS — Z471 Aftercare following joint replacement surgery: Secondary | ICD-10-CM | POA: Diagnosis not present

## 2022-02-11 DIAGNOSIS — Z4789 Encounter for other orthopedic aftercare: Secondary | ICD-10-CM | POA: Diagnosis not present

## 2022-02-13 DIAGNOSIS — M25562 Pain in left knee: Secondary | ICD-10-CM | POA: Diagnosis not present

## 2022-02-13 DIAGNOSIS — Z4789 Encounter for other orthopedic aftercare: Secondary | ICD-10-CM | POA: Diagnosis not present

## 2022-02-13 DIAGNOSIS — R262 Difficulty in walking, not elsewhere classified: Secondary | ICD-10-CM | POA: Diagnosis not present

## 2022-02-13 DIAGNOSIS — Z471 Aftercare following joint replacement surgery: Secondary | ICD-10-CM | POA: Diagnosis not present

## 2022-02-13 DIAGNOSIS — M6281 Muscle weakness (generalized): Secondary | ICD-10-CM | POA: Diagnosis not present

## 2022-02-13 DIAGNOSIS — Z741 Need for assistance with personal care: Secondary | ICD-10-CM | POA: Diagnosis not present

## 2022-02-13 DIAGNOSIS — M6250 Muscle wasting and atrophy, not elsewhere classified, unspecified site: Secondary | ICD-10-CM | POA: Diagnosis not present

## 2022-02-13 DIAGNOSIS — M170 Bilateral primary osteoarthritis of knee: Secondary | ICD-10-CM | POA: Diagnosis not present

## 2022-02-13 DIAGNOSIS — M6259 Muscle wasting and atrophy, not elsewhere classified, multiple sites: Secondary | ICD-10-CM | POA: Diagnosis not present

## 2022-02-13 NOTE — Telephone Encounter (Signed)
Done thx

## 2022-02-14 DIAGNOSIS — K219 Gastro-esophageal reflux disease without esophagitis: Secondary | ICD-10-CM | POA: Diagnosis not present

## 2022-02-14 DIAGNOSIS — M25562 Pain in left knee: Secondary | ICD-10-CM | POA: Diagnosis not present

## 2022-02-14 DIAGNOSIS — Z471 Aftercare following joint replacement surgery: Secondary | ICD-10-CM | POA: Diagnosis not present

## 2022-02-14 DIAGNOSIS — M6281 Muscle weakness (generalized): Secondary | ICD-10-CM | POA: Diagnosis not present

## 2022-02-14 DIAGNOSIS — M6259 Muscle wasting and atrophy, not elsewhere classified, multiple sites: Secondary | ICD-10-CM | POA: Diagnosis not present

## 2022-02-14 DIAGNOSIS — Z741 Need for assistance with personal care: Secondary | ICD-10-CM | POA: Diagnosis not present

## 2022-02-14 DIAGNOSIS — R262 Difficulty in walking, not elsewhere classified: Secondary | ICD-10-CM | POA: Diagnosis not present

## 2022-02-14 DIAGNOSIS — M170 Bilateral primary osteoarthritis of knee: Secondary | ICD-10-CM | POA: Diagnosis not present

## 2022-02-14 DIAGNOSIS — M6250 Muscle wasting and atrophy, not elsewhere classified, unspecified site: Secondary | ICD-10-CM | POA: Diagnosis not present

## 2022-02-14 DIAGNOSIS — Z4789 Encounter for other orthopedic aftercare: Secondary | ICD-10-CM | POA: Diagnosis not present

## 2022-02-14 DIAGNOSIS — M549 Dorsalgia, unspecified: Secondary | ICD-10-CM | POA: Diagnosis not present

## 2022-02-15 DIAGNOSIS — D649 Anemia, unspecified: Secondary | ICD-10-CM | POA: Diagnosis not present

## 2022-02-15 DIAGNOSIS — R262 Difficulty in walking, not elsewhere classified: Secondary | ICD-10-CM | POA: Diagnosis not present

## 2022-02-15 DIAGNOSIS — M6250 Muscle wasting and atrophy, not elsewhere classified, unspecified site: Secondary | ICD-10-CM | POA: Diagnosis not present

## 2022-02-15 DIAGNOSIS — J4521 Mild intermittent asthma with (acute) exacerbation: Secondary | ICD-10-CM | POA: Diagnosis not present

## 2022-02-15 DIAGNOSIS — Z471 Aftercare following joint replacement surgery: Secondary | ICD-10-CM | POA: Diagnosis not present

## 2022-02-15 DIAGNOSIS — M25562 Pain in left knee: Secondary | ICD-10-CM | POA: Diagnosis not present

## 2022-02-15 DIAGNOSIS — G5603 Carpal tunnel syndrome, bilateral upper limbs: Secondary | ICD-10-CM | POA: Diagnosis not present

## 2022-02-15 DIAGNOSIS — Z4789 Encounter for other orthopedic aftercare: Secondary | ICD-10-CM | POA: Diagnosis not present

## 2022-02-15 DIAGNOSIS — M170 Bilateral primary osteoarthritis of knee: Secondary | ICD-10-CM | POA: Diagnosis not present

## 2022-02-15 DIAGNOSIS — M6281 Muscle weakness (generalized): Secondary | ICD-10-CM | POA: Diagnosis not present

## 2022-02-15 DIAGNOSIS — F331 Major depressive disorder, recurrent, moderate: Secondary | ICD-10-CM | POA: Diagnosis not present

## 2022-02-15 DIAGNOSIS — Z741 Need for assistance with personal care: Secondary | ICD-10-CM | POA: Diagnosis not present

## 2022-02-15 DIAGNOSIS — M6259 Muscle wasting and atrophy, not elsewhere classified, multiple sites: Secondary | ICD-10-CM | POA: Diagnosis not present

## 2022-02-15 DIAGNOSIS — M5459 Other low back pain: Secondary | ICD-10-CM | POA: Diagnosis not present

## 2022-02-16 DIAGNOSIS — R262 Difficulty in walking, not elsewhere classified: Secondary | ICD-10-CM | POA: Diagnosis not present

## 2022-02-16 DIAGNOSIS — M170 Bilateral primary osteoarthritis of knee: Secondary | ICD-10-CM | POA: Diagnosis not present

## 2022-02-16 DIAGNOSIS — M6250 Muscle wasting and atrophy, not elsewhere classified, unspecified site: Secondary | ICD-10-CM | POA: Diagnosis not present

## 2022-02-16 DIAGNOSIS — M6259 Muscle wasting and atrophy, not elsewhere classified, multiple sites: Secondary | ICD-10-CM | POA: Diagnosis not present

## 2022-02-16 DIAGNOSIS — M6281 Muscle weakness (generalized): Secondary | ICD-10-CM | POA: Diagnosis not present

## 2022-02-16 DIAGNOSIS — Z4789 Encounter for other orthopedic aftercare: Secondary | ICD-10-CM | POA: Diagnosis not present

## 2022-02-16 DIAGNOSIS — Z471 Aftercare following joint replacement surgery: Secondary | ICD-10-CM | POA: Diagnosis not present

## 2022-02-16 DIAGNOSIS — M25562 Pain in left knee: Secondary | ICD-10-CM | POA: Diagnosis not present

## 2022-02-16 DIAGNOSIS — I10 Essential (primary) hypertension: Secondary | ICD-10-CM | POA: Diagnosis not present

## 2022-02-16 DIAGNOSIS — Z741 Need for assistance with personal care: Secondary | ICD-10-CM | POA: Diagnosis not present

## 2022-02-17 DIAGNOSIS — Z4789 Encounter for other orthopedic aftercare: Secondary | ICD-10-CM | POA: Diagnosis not present

## 2022-02-17 DIAGNOSIS — M6281 Muscle weakness (generalized): Secondary | ICD-10-CM | POA: Diagnosis not present

## 2022-02-17 DIAGNOSIS — M25562 Pain in left knee: Secondary | ICD-10-CM | POA: Diagnosis not present

## 2022-02-17 DIAGNOSIS — G894 Chronic pain syndrome: Secondary | ICD-10-CM | POA: Diagnosis not present

## 2022-02-17 DIAGNOSIS — M170 Bilateral primary osteoarthritis of knee: Secondary | ICD-10-CM | POA: Diagnosis not present

## 2022-02-17 DIAGNOSIS — M6250 Muscle wasting and atrophy, not elsewhere classified, unspecified site: Secondary | ICD-10-CM | POA: Diagnosis not present

## 2022-02-17 DIAGNOSIS — Z741 Need for assistance with personal care: Secondary | ICD-10-CM | POA: Diagnosis not present

## 2022-02-17 DIAGNOSIS — Z471 Aftercare following joint replacement surgery: Secondary | ICD-10-CM | POA: Diagnosis not present

## 2022-02-17 DIAGNOSIS — R262 Difficulty in walking, not elsewhere classified: Secondary | ICD-10-CM | POA: Diagnosis not present

## 2022-02-17 DIAGNOSIS — M6259 Muscle wasting and atrophy, not elsewhere classified, multiple sites: Secondary | ICD-10-CM | POA: Diagnosis not present

## 2022-02-18 DIAGNOSIS — R262 Difficulty in walking, not elsewhere classified: Secondary | ICD-10-CM | POA: Diagnosis not present

## 2022-02-18 DIAGNOSIS — M170 Bilateral primary osteoarthritis of knee: Secondary | ICD-10-CM | POA: Diagnosis not present

## 2022-02-18 DIAGNOSIS — Z741 Need for assistance with personal care: Secondary | ICD-10-CM | POA: Diagnosis not present

## 2022-02-18 DIAGNOSIS — Z4789 Encounter for other orthopedic aftercare: Secondary | ICD-10-CM | POA: Diagnosis not present

## 2022-02-18 DIAGNOSIS — M25562 Pain in left knee: Secondary | ICD-10-CM | POA: Diagnosis not present

## 2022-02-18 DIAGNOSIS — M6250 Muscle wasting and atrophy, not elsewhere classified, unspecified site: Secondary | ICD-10-CM | POA: Diagnosis not present

## 2022-02-18 DIAGNOSIS — M6281 Muscle weakness (generalized): Secondary | ICD-10-CM | POA: Diagnosis not present

## 2022-02-18 DIAGNOSIS — M6259 Muscle wasting and atrophy, not elsewhere classified, multiple sites: Secondary | ICD-10-CM | POA: Diagnosis not present

## 2022-02-18 DIAGNOSIS — Z471 Aftercare following joint replacement surgery: Secondary | ICD-10-CM | POA: Diagnosis not present

## 2022-02-19 DIAGNOSIS — M170 Bilateral primary osteoarthritis of knee: Secondary | ICD-10-CM | POA: Diagnosis not present

## 2022-02-19 DIAGNOSIS — Z4789 Encounter for other orthopedic aftercare: Secondary | ICD-10-CM | POA: Diagnosis not present

## 2022-02-19 DIAGNOSIS — Z741 Need for assistance with personal care: Secondary | ICD-10-CM | POA: Diagnosis not present

## 2022-02-19 DIAGNOSIS — Z471 Aftercare following joint replacement surgery: Secondary | ICD-10-CM | POA: Diagnosis not present

## 2022-02-19 DIAGNOSIS — M25562 Pain in left knee: Secondary | ICD-10-CM | POA: Diagnosis not present

## 2022-02-19 DIAGNOSIS — M6259 Muscle wasting and atrophy, not elsewhere classified, multiple sites: Secondary | ICD-10-CM | POA: Diagnosis not present

## 2022-02-19 DIAGNOSIS — M6250 Muscle wasting and atrophy, not elsewhere classified, unspecified site: Secondary | ICD-10-CM | POA: Diagnosis not present

## 2022-02-19 DIAGNOSIS — R262 Difficulty in walking, not elsewhere classified: Secondary | ICD-10-CM | POA: Diagnosis not present

## 2022-02-19 DIAGNOSIS — M6281 Muscle weakness (generalized): Secondary | ICD-10-CM | POA: Diagnosis not present

## 2022-02-20 DIAGNOSIS — Z471 Aftercare following joint replacement surgery: Secondary | ICD-10-CM | POA: Diagnosis not present

## 2022-02-20 DIAGNOSIS — M170 Bilateral primary osteoarthritis of knee: Secondary | ICD-10-CM | POA: Diagnosis not present

## 2022-02-20 DIAGNOSIS — M6250 Muscle wasting and atrophy, not elsewhere classified, unspecified site: Secondary | ICD-10-CM | POA: Diagnosis not present

## 2022-02-20 DIAGNOSIS — Z4789 Encounter for other orthopedic aftercare: Secondary | ICD-10-CM | POA: Diagnosis not present

## 2022-02-20 DIAGNOSIS — Z741 Need for assistance with personal care: Secondary | ICD-10-CM | POA: Diagnosis not present

## 2022-02-20 DIAGNOSIS — M6259 Muscle wasting and atrophy, not elsewhere classified, multiple sites: Secondary | ICD-10-CM | POA: Diagnosis not present

## 2022-02-20 DIAGNOSIS — R262 Difficulty in walking, not elsewhere classified: Secondary | ICD-10-CM | POA: Diagnosis not present

## 2022-02-20 DIAGNOSIS — M6281 Muscle weakness (generalized): Secondary | ICD-10-CM | POA: Diagnosis not present

## 2022-02-20 DIAGNOSIS — M25562 Pain in left knee: Secondary | ICD-10-CM | POA: Diagnosis not present

## 2022-02-21 DIAGNOSIS — M6250 Muscle wasting and atrophy, not elsewhere classified, unspecified site: Secondary | ICD-10-CM | POA: Diagnosis not present

## 2022-02-21 DIAGNOSIS — M6259 Muscle wasting and atrophy, not elsewhere classified, multiple sites: Secondary | ICD-10-CM | POA: Diagnosis not present

## 2022-02-21 DIAGNOSIS — R262 Difficulty in walking, not elsewhere classified: Secondary | ICD-10-CM | POA: Diagnosis not present

## 2022-02-21 DIAGNOSIS — M25562 Pain in left knee: Secondary | ICD-10-CM | POA: Diagnosis not present

## 2022-02-21 DIAGNOSIS — Z471 Aftercare following joint replacement surgery: Secondary | ICD-10-CM | POA: Diagnosis not present

## 2022-02-21 DIAGNOSIS — M6281 Muscle weakness (generalized): Secondary | ICD-10-CM | POA: Diagnosis not present

## 2022-02-21 DIAGNOSIS — Z4789 Encounter for other orthopedic aftercare: Secondary | ICD-10-CM | POA: Diagnosis not present

## 2022-02-21 DIAGNOSIS — M170 Bilateral primary osteoarthritis of knee: Secondary | ICD-10-CM | POA: Diagnosis not present

## 2022-02-21 DIAGNOSIS — Z741 Need for assistance with personal care: Secondary | ICD-10-CM | POA: Diagnosis not present

## 2022-02-22 ENCOUNTER — Telehealth: Payer: Self-pay | Admitting: Family Medicine

## 2022-02-22 DIAGNOSIS — D649 Anemia, unspecified: Secondary | ICD-10-CM | POA: Diagnosis not present

## 2022-02-22 DIAGNOSIS — G5603 Carpal tunnel syndrome, bilateral upper limbs: Secondary | ICD-10-CM | POA: Diagnosis not present

## 2022-02-22 DIAGNOSIS — J4521 Mild intermittent asthma with (acute) exacerbation: Secondary | ICD-10-CM | POA: Diagnosis not present

## 2022-02-22 DIAGNOSIS — F331 Major depressive disorder, recurrent, moderate: Secondary | ICD-10-CM | POA: Diagnosis not present

## 2022-02-22 DIAGNOSIS — M170 Bilateral primary osteoarthritis of knee: Secondary | ICD-10-CM | POA: Diagnosis not present

## 2022-02-22 NOTE — Telephone Encounter (Signed)
Tried calling patient to schedule Medicare Annual Wellness Visit (AWV) either virtually or in office.   No answer  Last AWV 01/19/21 ; please schedule at anytime with [facility] Nurse Health Advisor 1 or 2

## 2022-02-23 ENCOUNTER — Other Ambulatory Visit: Payer: Self-pay

## 2022-02-23 ENCOUNTER — Emergency Department (HOSPITAL_COMMUNITY)
Admission: EM | Admit: 2022-02-23 | Discharge: 2022-02-23 | Disposition: A | Discharge status: Skilled Nursing Facility | Payer: Medicare PPO | Attending: Emergency Medicine | Admitting: Emergency Medicine

## 2022-02-23 ENCOUNTER — Encounter (HOSPITAL_COMMUNITY): Payer: Self-pay

## 2022-02-23 DIAGNOSIS — R109 Unspecified abdominal pain: Secondary | ICD-10-CM | POA: Insufficient documentation

## 2022-02-23 DIAGNOSIS — M25562 Pain in left knee: Secondary | ICD-10-CM | POA: Diagnosis not present

## 2022-02-23 DIAGNOSIS — W19XXXA Unspecified fall, initial encounter: Secondary | ICD-10-CM | POA: Diagnosis not present

## 2022-02-23 DIAGNOSIS — Z7951 Long term (current) use of inhaled steroids: Secondary | ICD-10-CM | POA: Insufficient documentation

## 2022-02-23 DIAGNOSIS — R531 Weakness: Secondary | ICD-10-CM | POA: Diagnosis not present

## 2022-02-23 DIAGNOSIS — R3912 Poor urinary stream: Secondary | ICD-10-CM | POA: Diagnosis present

## 2022-02-23 DIAGNOSIS — Z743 Need for continuous supervision: Secondary | ICD-10-CM | POA: Diagnosis not present

## 2022-02-23 DIAGNOSIS — J45909 Unspecified asthma, uncomplicated: Secondary | ICD-10-CM | POA: Diagnosis not present

## 2022-02-23 DIAGNOSIS — M549 Dorsalgia, unspecified: Secondary | ICD-10-CM | POA: Diagnosis not present

## 2022-02-23 DIAGNOSIS — I10 Essential (primary) hypertension: Secondary | ICD-10-CM | POA: Diagnosis not present

## 2022-02-23 DIAGNOSIS — M545 Low back pain, unspecified: Secondary | ICD-10-CM | POA: Diagnosis not present

## 2022-02-23 DIAGNOSIS — E860 Dehydration: Secondary | ICD-10-CM | POA: Insufficient documentation

## 2022-02-23 DIAGNOSIS — R000 Tachycardia, unspecified: Secondary | ICD-10-CM | POA: Diagnosis not present

## 2022-02-23 DIAGNOSIS — Z7982 Long term (current) use of aspirin: Secondary | ICD-10-CM | POA: Diagnosis not present

## 2022-02-23 LAB — URINALYSIS, ROUTINE W REFLEX MICROSCOPIC
Bilirubin Urine: NEGATIVE
Glucose, UA: NEGATIVE mg/dL
Hgb urine dipstick: NEGATIVE
Ketones, ur: 20 mg/dL — AB
Leukocytes,Ua: NEGATIVE
Nitrite: NEGATIVE
Protein, ur: NEGATIVE mg/dL
Specific Gravity, Urine: 1.014 (ref 1.005–1.030)
pH: 7 (ref 5.0–8.0)

## 2022-02-23 LAB — CBC WITH DIFFERENTIAL/PLATELET
Abs Immature Granulocytes: 0.02 10*3/uL (ref 0.00–0.07)
Basophils Absolute: 0.1 10*3/uL (ref 0.0–0.1)
Basophils Relative: 1 %
Eosinophils Absolute: 0.7 10*3/uL — ABNORMAL HIGH (ref 0.0–0.5)
Eosinophils Relative: 8 %
HCT: 30.2 % — ABNORMAL LOW (ref 36.0–46.0)
Hemoglobin: 9.2 g/dL — ABNORMAL LOW (ref 12.0–15.0)
Immature Granulocytes: 0 %
Lymphocytes Relative: 19 %
Lymphs Abs: 1.8 10*3/uL (ref 0.7–4.0)
MCH: 26.6 pg (ref 26.0–34.0)
MCHC: 30.5 g/dL (ref 30.0–36.0)
MCV: 87.3 fL (ref 80.0–100.0)
Monocytes Absolute: 0.9 10*3/uL (ref 0.1–1.0)
Monocytes Relative: 9 %
Neutro Abs: 5.7 10*3/uL (ref 1.7–7.7)
Neutrophils Relative %: 63 %
Platelets: 728 10*3/uL — ABNORMAL HIGH (ref 150–400)
RBC: 3.46 MIL/uL — ABNORMAL LOW (ref 3.87–5.11)
RDW: 18.1 % — ABNORMAL HIGH (ref 11.5–15.5)
WBC: 9.2 10*3/uL (ref 4.0–10.5)
nRBC: 0 % (ref 0.0–0.2)

## 2022-02-23 LAB — BASIC METABOLIC PANEL
Anion gap: 8 (ref 5–15)
BUN: 5 mg/dL — ABNORMAL LOW (ref 8–23)
CO2: 27 mmol/L (ref 22–32)
Calcium: 8.5 mg/dL — ABNORMAL LOW (ref 8.9–10.3)
Chloride: 105 mmol/L (ref 98–111)
Creatinine, Ser: 0.44 mg/dL (ref 0.44–1.00)
GFR, Estimated: >60 mL/min (ref >60)
Glucose, Bld: 110 mg/dL — ABNORMAL HIGH (ref 70–99)
Potassium: 3.5 mmol/L (ref 3.5–5.1)
Sodium: 140 mmol/L (ref 135–145)

## 2022-02-23 MED ORDER — LACTATED RINGERS IV BOLUS
1000.0000 mL | Freq: Once | INTRAVENOUS | Status: AC
  Administered 2022-02-23: 1000 mL via INTRAVENOUS

## 2022-02-23 MED ORDER — DICLOFENAC SODIUM 1 % EX GEL
2.0000 g | Freq: Once | CUTANEOUS | Status: DC
  Filled 2022-02-23: qty 100

## 2022-02-23 NOTE — ED Triage Notes (Signed)
Pt states that she is at [facility] for rehab and pain management for L knee pain/ needing replacement. Pt states that she is having lower Left back/flank pain and has not urinated in 3 days.

## 2022-02-23 NOTE — ED Notes (Signed)
Assisted [facility] in transferring pt onto their stretcher, sent with d/c instructions and paperwork.

## 2022-02-23 NOTE — ED Provider Notes (Signed)
[facility] EMERGENCY DEPARTMENT Provider Note   CSN: [id number] Arrival date & time: 02/23/22  1031     History  Chief Complaint  Patient presents with   Flank Pain   Urinary Retention    [name] is a 71 y.o. female.  Patient is a 71 year old female with a history of sarcoidosis, asthma, diverticulitis, arthritis currently in a rehab facility due to severe pain in her left knee which she reports she is there to get stronger so she can have surgery who is presenting today with complaints of left back pain and decreased urination for the last 3 days she reports she has not urinated.  She denies any dysuria frequency or urgency.  She reports she has been eating and drinking but thinks she has been drinking a little less than usual.  She was hurting but they gave her something at the facility and she reports the pain is significantly improved now and she is not having any back pain.  She has no prior history of kidney stones.  She has had no nausea vomiting or diarrhea.  The history is provided by the patient.  Flank Pain      Home Medications Prior to Admission medications   Medication Sig Start Date End Date Taking? Authorizing Provider  acetaminophen (TYLENOL) 500 MG tablet Take 1,000 mg by mouth in the morning and at bedtime.    [provider]  albuterol (VENTOLIN HFA) 108 (90 Base) MCG/ACT inhaler TAKE 2 PUFFS BY MOUTH EVERY 6 HOURS AS NEEDED Patient taking differently: Inhale 2 puffs into the lungs every 6 (six) hours as needed for shortness of breath or wheezing. 11/26/20   [name], MD  aspirin 81 MG chewable tablet Chew 1 tablet (81 mg total) by mouth 2 (two) times daily. 12/14/21   Magnant, Charles L, PA-C  cyclobenzaprine (FLEXERIL) 5 MG tablet Take 5 mg by mouth every 8 (eight) hours as needed for muscle spasms. 10/13/21   [provider]  cycloSPORINE (RESTASIS) 0.05 % ophthalmic emulsion Place 1 drop into both eyes 2  (two) times daily.    [provider]  docusate sodium (COLACE) 100 MG capsule Take 100 mg by mouth daily.    [provider]  DULoxetine (CYMBALTA) 30 MG capsule Take 30 mg by mouth daily. 10/26/21   [provider]  estradiol (ESTRACE) 1 MG tablet Take 1 tablet (1 mg total) by mouth daily. 06/11/12   Marletta Lor, MD  fluticasone (FLONASE) 50 MCG/ACT nasal spray Place 1 spray into both nostrils daily. Patient taking differently: Place 1 spray into both nostrils daily as needed for allergies or rhinitis. 08/17/21   [name], MD  furosemide (LASIX) 20 MG tablet Take 20 mg by mouth daily as needed for edema. 11/15/21   [provider]  guaiFENesin-dextromethorphan (ROBITUSSIN DM) 100-10 MG/5ML syrup Take 10 mLs by mouth every 6 (six) hours as needed for cough.    [provider]  levocetirizine (XYZAL ALLERGY 24HR) 5 MG tablet Take 1 tablet (5 mg total) by mouth every evening. Patient taking differently: Take 5 mg by mouth daily. 08/17/21   [name], MD  omeprazole (PRILOSEC) 20 MG capsule Take 20 mg by mouth in the morning and at bedtime.    [provider]  ondansetron (ZOFRAN-ODT) 8 MG disintegrating tablet Take 8 mg by mouth 2 (two) times daily as needed for nausea/vomiting. 10/26/21   [provider]  oxyCODONE (OXYCONTIN) 15 mg  12 hr tablet Take 15 mg by mouth daily as needed (osteoarthritis).    [provider]  oxyCODONE 10 MG TABS Take 1 tablet (10 mg total) by mouth every 4 (four) hours as needed for severe pain. 12/14/21   Magnant, Charles L, PA-C  polyethylene glycol powder (GLYCOLAX/MIRALAX) 17 GM/SCOOP powder Take 17 g by mouth daily as needed. Patient taking differently: Take 17 g by mouth daily as needed for mild constipation. 08/17/21   [name], MD  Vitamin D, Ergocalciferol, (DRISDOL) 1.25 MG (50000 UNIT) CAPS capsule Take 1 capsule (50,000 Units total) by mouth every 7 (seven) days.  05/26/21   [name], MD  WIXELA INHUB 250-50 MCG/ACT AEPB INHALE 1 PUFF BY MOUTH TWICE A DAY Patient taking differently: Inhale 1 puff into the lungs in the morning and at bedtime. 06/07/21   [name], MD      Allergies    Ibuprofen and Sulfa antibiotics    Review of Systems   Review of Systems  Genitourinary:  Positive for flank pain.   Physical Exam Updated Vital Signs BP (!) 150/87 (BP Location: Right Arm)   Pulse (!) 114   Temp 98.8 F (37.1 C) (Oral)   Resp 17   SpO2 99%  Physical Exam Vitals and nursing note reviewed.  Constitutional:      General: She is not in acute distress.    Appearance: She is well-developed.  HENT:     Head: Normocephalic and atraumatic.  Eyes:     Conjunctiva/sclera: Conjunctivae normal.     Pupils: Pupils are equal, round, and reactive to light.  Cardiovascular:     Rate and Rhythm: Normal rate and regular rhythm.     Heart sounds: No murmur heard. Pulmonary:     Effort: Pulmonary effort is normal. No respiratory distress.     Breath sounds: Normal breath sounds. No wheezing or rales.  Abdominal:     General: There is no distension.     Palpations: Abdomen is soft.     Tenderness: There is no abdominal tenderness. There is no guarding or rebound.     Comments: Mild suprapubic and left lower quadrant tenderness.  No CVA tenderness  Musculoskeletal:        General: No tenderness. Normal range of motion.     Cervical back: Normal range of motion and neck supple.  Skin:    General: Skin is warm and dry.     Findings: No erythema or rash.  Neurological:     Mental Status: She is alert and oriented to person, place, and time.  Psychiatric:        Behavior: Behavior normal.    ED Results / Procedures / Treatments   Labs (all labs ordered are listed, but only abnormal results are displayed) Labs Reviewed  CBC WITH DIFFERENTIAL/PLATELET - Abnormal; Notable for the following components:      Result Value   RBC 3.46 (*)     Hemoglobin 9.2 (*)    HCT 30.2 (*)    RDW 18.1 (*)    Platelets 728 (*)    Eosinophils Absolute 0.7 (*)    All other components within normal limits  BASIC METABOLIC PANEL - Abnormal; Notable for the following components:   Glucose, Bld 110 (*)    BUN 5 (*)    Calcium 8.5 (*)    All other components within normal limits  URINALYSIS, ROUTINE W REFLEX MICROSCOPIC - Abnormal; Notable for the following components:   APPearance  HAZY (*)    Ketones, ur 20 (*)    All other components within normal limits    EKG None  Radiology No results found.  Procedures Procedures    Medications Ordered in ED Medications  diclofenac Sodium (VOLTAREN) 1 % topical gel 2 g (has no administration in time range)  lactated ringers bolus 1,000 mL (0 mLs Intravenous Stopped 02/23/22 1326)    ED Course/ Medical Decision Making/ A&P                           Medical Decision Making Amount and/or Complexity of Data Reviewed External Data Reviewed: notes. Labs: ordered. Decision-making details documented in ED Course.   Pt with multiple medical problems and comorbidities and presenting today with a complaint that caries a high risk for morbidity and mortality.  Presenting today for the complaint of inability to urinate for the last 3 days and left-sided back pain.  Patient did receive some pain medication prior to arrival and is relatively well-appearing at this time.  Concern for renal stone versus UTI versus diverticulitis.  There is no evidence of urinary retention at this time.  Also concern for possible AKI as patient reports not being able to urinate for the last 3 days.  We will give patient oral fluids as she denies any recent nausea vomiting or diarrhea.  Labs are pending.  She was also given IV fluids.  2:56 PM I independently interpreted the patient's labs today and patient CBC with stable hemoglobin of 9.2, normal white count and platelets, a.m. BMP with normal renal function with creatinine  of 0.44.  UA shows no evidence of UTI at this time.  Patient was able to urinate here without difficulty.  Findings were discussed with the patient.  At this time she is having no significant pain and feel that she is stable for discharge.  She does have pain medication at the facility she can take as needed.  Low suspicion for acute abdominal pathology at this time and suspect patient's pain may have been musculoskeletal in nature.         Final Clinical Impression(s) / ED Diagnoses Final diagnoses:  Dehydration    Rx / DC Orders ED Discharge Orders     None         Blanchie Dessert, MD 02/23/22 1456

## 2022-02-23 NOTE — ED Notes (Signed)
Called [facility], facility stated the nurse had already left for the day and was not available to give report to. Gave report to [place] the med tech

## 2022-02-23 NOTE — Discharge Instructions (Addendum)
All the blood test and urine today looked normal.  Make sure you have plenty of fluids that you can drink as needed at the rehab where you are staying.  No signs of infection or problem with your kidneys today.  Continue to take the pain medication you have prescribed as needed.

## 2022-03-01 ENCOUNTER — Ambulatory Visit (INDEPENDENT_AMBULATORY_CARE_PROVIDER_SITE_OTHER): Payer: Medicare PPO | Admitting: Orthopedic Surgery

## 2022-03-01 ENCOUNTER — Encounter: Payer: Self-pay | Admitting: Orthopedic Surgery

## 2022-03-01 DIAGNOSIS — J4520 Mild intermittent asthma, uncomplicated: Secondary | ICD-10-CM | POA: Diagnosis not present

## 2022-03-01 DIAGNOSIS — M1712 Unilateral primary osteoarthritis, left knee: Secondary | ICD-10-CM

## 2022-03-01 DIAGNOSIS — M150 Primary generalized (osteo)arthritis: Secondary | ICD-10-CM | POA: Diagnosis not present

## 2022-03-01 DIAGNOSIS — D649 Anemia, unspecified: Secondary | ICD-10-CM | POA: Diagnosis not present

## 2022-03-01 DIAGNOSIS — F339 Major depressive disorder, recurrent, unspecified: Secondary | ICD-10-CM | POA: Diagnosis not present

## 2022-03-02 ENCOUNTER — Telehealth: Payer: Self-pay | Admitting: *Deleted

## 2022-03-02 NOTE — Telephone Encounter (Signed)
(  Late entry today for 03/01/22) Met with patient in office today during her post op appointment with Dr. Marlou Sa. Discussed having her other knee replaced when appropriate. She has recently gone into [place] facility. She is unable to care for herself at home and we discussed her social situation in that she is most likely going to have to downsize and sell her house as she hasn't been living there since last year. She will need SNF rehab for STR after surgery pending she is able to have this done. Reviewed 30 day survey. Will be in contact with patient to assist with future discharge needs related to this surgery as well as her Left TKR once scheduled.

## 2022-03-05 NOTE — Progress Notes (Signed)
Post-Op Visit Note   Patient: [name]           Date of Birth: 16-Mar-1951           MRN: [id number] Visit Date: 03/01/2022 PCP: [name], MD   Assessment & Plan:  Chief Complaint:  Chief Complaint  Patient presents with   Right Knee - Routine Post Op   Left Knee - Pain   Visit Diagnoses:  1. Unilateral primary osteoarthritis, left knee     Plan: [name] is a  71 year old patient with left knee pain.  She underwent right total knee replacement 12/08/2021.  Here for final check on that knee which is doing well.  [facility] is [facility] at [facility].  Is somewhat deconditioned.  Having severe left knee pain.  On examination the right knee has range of motion 5-95.  She is able to do a straight leg raise.  I think she will require intensive rehabilitation after her left knee replacement which she essentially has to have done in order to have any chance of regaining some degree of mobility.  Bilateral pedal pulses are palpable and there is no calf tenderness and negative Homans on both legs.  Plan at this time is to post for left knee replacement.  The risk and benefits are discussed including not limited to infection nerve vessel damage incomplete restoration of function and incomplete pain relief as well as potential for knee stiffness and more surgery.  I did write her prescription to start taking protein supplements to maximize her nutritional status prior to replacement.  Patient understands risk and benefits and wishes to proceed.  All questions answered  Follow-Up Instructions: No follow-ups on file.   Orders:  No orders of the defined types were placed in this encounter.  No orders of the defined types were placed in this encounter.   Imaging: No results found.  PMFS History: Patient Active Problem List   Diagnosis Date Noted   Arthritis of right knee    S/P total knee arthroplasty, right 12/08/2021   Pressure injury of skin 08/27/2021   Fall  08/23/2021   Difficulty in walking 08/23/2021   Bilateral lower extremity edema 08/23/2021   Hormone replacement therapy 08/23/2021   Osteoporosis 01/28/2021   Osteoarthritis of right wrist 01/28/2021   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   Foot pain 09/13/2020   Knee pain 08/24/2018   Fall in home 07/14/2015   TIBIALIS TENDINITIS 08/25/2009   CHEST PAIN, OTHER, PAIN 10/21/2008   PEDAL EDEMA 07/22/2008   ANKLE PAIN, BILATERAL 04/01/2008   LOW BACK PAIN 04/01/2008   ACUTE BRONCHITIS 09/23/2007   SARCOIDOSIS 09/02/2007   Asthma 09/02/2007   Diverticulosis of colon 07/11/2007   BACK PAIN, CHRONIC 07/11/2007   Past Medical History:  Diagnosis Date   Acute bronchitis    Allergy    Anxiety    Arthritis    knees   Asthma    Backache, unspecified    Cataract    removed both eyes   Depression    Diverticulitis    Diverticulosis of colon (without mention of hemorrhage)    Edema    Glaucoma    Lumbago    Obesity    Other chest pain    Pain in joint, ankle and foot    Sarcoidosis    Tibialis tendinitis    Unspecified asthma(493.90)     Family History  Problem Relation Age of Onset   Hypertension Mother    Stroke  Mother    Diabetes Father    Sarcoidosis Sister    Colon cancer Neg Hx    Colon polyps Neg Hx    Esophageal cancer Neg Hx    Rectal cancer Neg Hx    Stomach cancer Neg Hx     Past Surgical History:  Procedure Laterality Date   ABDOMINAL HYSTERECTOMY     CATARACT EXTRACTION W/PHACO Right 06/24/2014   Procedure: CATARACT EXTRACTION PHACO AND INTRAOCULAR LENS PLACEMENT ([place]) RIGHT EYE WITH GONIOSYNECHIALYSIS;  Surgeon: Marylynn Pearson, MD;  Location: [place];  Service: Ophthalmology;  Laterality: Right;   COLONOSCOPY     EYE SURGERY Left    cataract surgery   HAND SURGERY Right    trigger finger release   IR RADIOLOGIST EVAL & MGMT  11/14/2018   KNEE ARTHROSCOPY Right    TOTAL KNEE ARTHROPLASTY Right 12/08/2021   Procedure: RIGHT TOTAL KNEE ARTHROPLASTY  APPLICATION OF WOUND VAC;  Surgeon: Meredith Pel, MD;  Location: [place];  Service: Orthopedics;  Laterality: Right;   Social History   Occupational History   Not on file  Tobacco Use   Smoking status: Never   Smokeless tobacco: Never  Vaping Use   Vaping Use: Never used  Substance and Sexual Activity   Alcohol use: Yes    Comment: 1 glass of wine or mixed drink once a week   Drug use: No   Sexual activity: Not on file

## 2022-03-08 ENCOUNTER — Telehealth: Payer: Self-pay | Admitting: Orthopedic Surgery

## 2022-03-08 ENCOUNTER — Ambulatory Visit: Payer: Medicare PPO | Admitting: Family Medicine

## 2022-03-08 NOTE — Telephone Encounter (Signed)
IC verbal given.  

## 2022-03-08 NOTE — Telephone Encounter (Signed)
Received call from [facility] (PT) with [facility] needing verbal orders for HHPT 2 Wk 6. Brayton Layman also said she need verbal order for (OT) eval. The number to contact Brayton Layman is [phone]

## 2022-03-13 ENCOUNTER — Telehealth: Payer: Self-pay | Admitting: *Deleted

## 2022-03-13 NOTE — Telephone Encounter (Signed)
Received a call from Dr. Karlton Lemon, who is working with patient to make sure she has everything she needs regarding surgery. States patient will be leaving [place] on 03/23/22 and wants to know when/if we can get surgery scheduled for patient's Left knee replacement ASAP.

## 2022-03-22 ENCOUNTER — Telehealth: Payer: Self-pay | Admitting: Family Medicine

## 2022-03-22 NOTE — Telephone Encounter (Signed)
Tried calling patient to schedule Medicare Annual Wellness Visit (AWV) either virtually or in office. Left  my Herbie Drape number [phone]  No answer   Last AWV 01/19/21 ; please schedule at anytime with [facility] Nurse Health Advisor 1 or 2

## 2022-03-28 ENCOUNTER — Telehealth: Payer: Self-pay | Admitting: *Deleted

## 2022-03-28 ENCOUNTER — Other Ambulatory Visit: Payer: Self-pay

## 2022-03-28 NOTE — Telephone Encounter (Signed)
90 day Ortho bundle call and survey completed.

## 2022-03-29 DIAGNOSIS — D509 Iron deficiency anemia, unspecified: Secondary | ICD-10-CM | POA: Diagnosis not present

## 2022-03-29 DIAGNOSIS — F331 Major depressive disorder, recurrent, moderate: Secondary | ICD-10-CM | POA: Diagnosis not present

## 2022-04-08 NOTE — Pre-Procedure Instructions (Addendum)
[name]  04/08/2022     Surgical Instructions   Your procedure is scheduled on Thurs., April 13, 2022 from 3:08PM-6:09PM.  Report to [facility] Main Entrance "A" at 1:05 P.M., then check in with the Admitting office.  Call this number if you have problems the morning of surgery:  [phone]   Remember:  Do not eat after midnight on July 12th  You may drink clear liquids until 3 hours 12:05PM prior to surgery time, the morning of your surgery.   Clear liquids allowed are: Water, Non-Citrus Juices (without pulp), Carbonated Beverages, Clear Tea, Black Coffee ONLY (NO MILK, CREAM OR POWDERED CREAMER of any kind), and Gatorade  Enhanced Recovery after Surgery for Orthopedics Enhanced Recovery after Surgery is a protocol used to improve the stress on your body and your recovery after surgery.  Patient Instructions No food after midnight. ONLY clear liquids after midnight  The day of surgery (if you do NOT have diabetes): Drink by 12:05PM Drink ONE (1) Pre-Surgery Clear Ensure as directed.   This drink was given to you during your hospital  pre-op appointment visit. Finish the drink at the designated time by the pre-op nurse.  Nothing else to drink after completing the  Pre-Surgery Clear Ensure.         If you have questions, please contact your surgeon's office.   Take these medicines the morning of surgery with A SIP OF WATER: CycloSPORINE (RESTASIS) DULoxetine (CYMBALTA) Estradiol (ESTRACE) Fluticasone (FLONASE)  Gabapentin (NEURONTIN) Levocetirizine (XYZAL ALLERGY 24HR Omeprazole (PRILOSEC) WIXELA INHUB- Bring with you day of surgery  If Needed: Acetaminophen (TYLENOL) Albuterol (VENTOLIN HFA)- Bring with you day of surgery Ondansetron (ZOFRAN-ODT) oxyCODONE   As of today, STOP taking any Aspirin (unless otherwise instructed by your surgeon) Aleve, Naproxen, Ibuprofen, Motrin, Advil, Goody's, BC's, all herbal medications, fish oil, and all vitamins.              Day of Surgery: Do not wear jewelry or makeup Do not wear lotions, powders, perfumes, or deodorant. Do not shave 48 hours prior to surgery.  Do not bring valuables to the hospital. DO Not wear nail polish, gel polish, artificial nails, or any other type of covering on natural nails (fingers and toes) If you have artificial nails or gel coating that need to be removed by a nail salon, please have this removed prior to surgery. Artificial nails or gel coating may interfere with anesthesia's ability to adequately monitor your vital signs.             [facility] is not responsible for any belongings or valuables.  Do NOT Smoke (Tobacco/Vaping)  24 hours prior to your procedure  If you use a CPAP at night, you may bring your mask and machine for your overnight stay.   Contacts, glasses, hearing aids, dentures or partials may not be worn into surgery, please bring cases for these belongings   For patients admitted to the hospital, discharge time will be determined by your treatment team.   Patients discharged the day of surgery will not be allowed to drive home, and someone needs to stay with them for 24 hours.  Special instructions:    Oral Hygiene is also important to reduce your risk of infection.  Remember - BRUSH YOUR TEETH THE MORNING OF SURGERY WITH YOUR REGULAR TOOTHPASTE  [facility]- Preparing For Surgery  Before surgery, you can play an important role. Because skin is not sterile, your skin needs to be as  free of germs as possible. You can reduce the number of germs on your skin by washing with CHG (chlorahexidine gluconate) Soap before surgery.  CHG is an antiseptic cleaner which kills germs and bonds with the skin to continue killing germs even after washing.    Please do not use if you have an allergy to CHG or antibacterial soaps. If your skin becomes reddened/irritated stop using the CHG.  Do not shave (including legs and underarms) for at least 48 hours prior to first  CHG shower. It is OK to shave your face.  Please follow these instructions carefully.    Shower the NIGHT BEFORE SURGERY and the MORNING OF SURGERY with CHG Soap.   If you chose to wash your hair, wash your hair first as usual with your normal shampoo. After you shampoo, rinse your hair and body thoroughly to remove the shampoo.  Then [employer] and genitals (private parts) with your normal soap and rinse thoroughly to remove soap.  After that Use CHG Soap as you would any other liquid soap. You can apply CHG directly to the skin and wash gently with a scrungie or a clean washcloth.   Apply the CHG Soap to your body ONLY FROM THE NECK DOWN.  Do not use on open wounds or open sores. Avoid contact with your eyes, ears, mouth and genitals (private parts). Wash Face and genitals (private parts)  with your normal soap.   Wash thoroughly, paying special attention to the area where your surgery will be performed.  Thoroughly rinse your body with warm water from the neck down.  DO NOT shower/wash with your normal soap after using and rinsing off the CHG Soap.  Pat yourself dry with a CLEAN TOWEL.  Wear CLEAN PAJAMAS to bed the night before surgery  Place CLEAN SHEETS on your bed the night before your surgery  DO NOT SLEEP WITH PETS.  Reminder: Take a shower with CHG soap. Wear Clean/Comfortable clothing the morning of surgery Do not apply any deodorants/lotions.   Remember to brush your teeth WITH YOUR REGULAR TOOTHPASTE.   If you have been in contact with anyone that has tested positive in the last 10 days please notify you surgeon.  Notify your provider:  if you develop a fever of 100.4 or greater, sneezing, cough, sore throat, shortness of breath or body aches.  NO VISITORS WILL BE ALLOWED IN PRE-OP WHERE PATIENTS ARE PREPPED FOR SURGERY.    SURGICAL WAITING ROOM VISITATION Patients having surgery or a procedure in a hospital may have two support people. Children under the age of 67  must have an adult with them who is not the patient. They may stay in the waiting area during the procedure and may switch out with other visitors. If the patient needs to stay at the hospital during part of their recovery, the visitor guidelines for inpatient rooms apply.  Please refer to the [facility] website for the visitor guidelines for Inpatients (after your surgery is over and you are in a regular room).   Please read over the following fact sheets that you were given.

## 2022-04-10 ENCOUNTER — Encounter (HOSPITAL_COMMUNITY)
Admission: RE | Admit: 2022-04-10 | Discharge: 2022-04-10 | Disposition: A | Discharge status: Home / Self Care | Payer: Medicare PPO | Source: Ambulatory Visit | Attending: Orthopedic Surgery | Admitting: Orthopedic Surgery

## 2022-04-10 ENCOUNTER — Encounter (HOSPITAL_COMMUNITY): Payer: Self-pay

## 2022-04-10 ENCOUNTER — Other Ambulatory Visit: Payer: Self-pay

## 2022-04-10 VITALS — BP 106/72 | HR 114 | Temp 98.5°F | Resp 17 | Ht 60.0 in

## 2022-04-10 DIAGNOSIS — Z01812 Encounter for preprocedural laboratory examination: Secondary | ICD-10-CM | POA: Diagnosis not present

## 2022-04-10 DIAGNOSIS — Z01818 Encounter for other preprocedural examination: Secondary | ICD-10-CM

## 2022-04-10 HISTORY — DX: Gastro-esophageal reflux disease without esophagitis: K21.9

## 2022-04-10 LAB — CBC
HCT: 29.6 % — ABNORMAL LOW (ref 36.0–46.0)
Hemoglobin: 9 g/dL — ABNORMAL LOW (ref 12.0–15.0)
MCH: 26.2 pg (ref 26.0–34.0)
MCHC: 30.4 g/dL (ref 30.0–36.0)
MCV: 86 fL (ref 80.0–100.0)
Platelets: 779 10*3/uL — ABNORMAL HIGH (ref 150–400)
RBC: 3.44 MIL/uL — ABNORMAL LOW (ref 3.87–5.11)
RDW: 19.2 % — ABNORMAL HIGH (ref 11.5–15.5)
WBC: 8.7 10*3/uL (ref 4.0–10.5)
nRBC: 0 % (ref 0.0–0.2)

## 2022-04-10 LAB — BASIC METABOLIC PANEL
Anion gap: 10 (ref 5–15)
BUN: 7 mg/dL — ABNORMAL LOW (ref 8–23)
CO2: 25 mmol/L (ref 22–32)
Calcium: 8.8 mg/dL — ABNORMAL LOW (ref 8.9–10.3)
Chloride: 101 mmol/L (ref 98–111)
Creatinine, Ser: 0.45 mg/dL (ref 0.44–1.00)
GFR, Estimated: >60 mL/min (ref >60)
Glucose, Bld: 100 mg/dL — ABNORMAL HIGH (ref 70–99)
Potassium: 3.8 mmol/L (ref 3.5–5.1)
Sodium: 136 mmol/L (ref 135–145)

## 2022-04-10 LAB — SURGICAL PCR SCREEN
MRSA, PCR: NEGATIVE
Staphylococcus aureus: POSITIVE — AB

## 2022-04-10 NOTE — Progress Notes (Addendum)
PCP - Grier Mitts MD Cardiologist -   PPM/ICD - denies Device Orders -  Rep Notified -   Chest x-ray - 08/23/2021 EKG - 09/23/21 Stress Test - denies ECHO - 08/24/21 Cardiac Cath - denies  Sleep Study - none CPAP -   Fasting Blood Sugar - na Checks Blood Sugar _____ times a day  Blood Thinner Instructions:na Aspirin Instructions:pt states she will call surgeon for aspirin instructions.   ERAS Protcol -clear liquids until 1205 PRE-SURGERY Ensure or G2- Ensure  COVID TEST- na   Anesthesia review: yes- abnormal labs.Notified Dr. Randel Pigg PA, Lurena Joiner, of pt's abnormal lab values(Hgb and Plts) and informed him that we were unable to obtain UA and culture for patient in PAT due to her inability to stand.  Patient denies shortness of breath, fever, cough and chest pain at PAT appointment   All instructions explained to the patient, with a verbal understanding of the material. Patient agrees to go over the instructions while at home for a better understanding. Patient also instructed to wear a mask when out in public prior to surgery.. The opportunity to ask questions was provided.

## 2022-04-11 NOTE — Anesthesia Preprocedure Evaluation (Signed)
Anesthesia Evaluation Anesthesia Physical Anesthesia Plan  ASA:   Anesthesia Plan:    Post-op Pain Management:    Induction:   PONV Risk Score and Plan:   Airway Management Planned:   Additional Equipment:   Intra-op Plan:   Post-operative Plan:   Informed Consent:   Plan Discussed with:   Anesthesia Plan Comments: (PAT note by Karoline Caldwell, PA-C: Preop labs notable for anemia with hemoglobin 9.0 and thrombocytosis with platelets 779.  Review of previous labs shows this to be chronic with baseline hemoglobin ~9.0 and platelets ~700.  Recently underwent right TKA 12/08/2021 with preop hemoglobin 9.6 and platelets 665.   Hemoglobin (g/dL) Date Value 04/10/2022 9.0 (L) 02/23/2022 9.2 (L) 12/13/2021 8.0 (L) 12/10/2021 8.4 (L) 12/09/2021 8.5 (L) 12/06/2021 9.6 (L) 09/23/2021 9.0 (L) 08/31/2021 8.8 (L) 08/25/2021 8.9 (L) 08/24/2021 9.4 (L)  Platelets (K/uL) Date Value 04/10/2022 779 (H) 02/23/2022 728 (H) 12/13/2021 623 (H) 12/10/2021 741 (H) 12/09/2021 772 (H) 12/06/2021 665 (H) 09/23/2021 571 (H) 08/31/2021 505 (H) 08/25/2021 467 (H) 08/24/2021 426 (H)   Recent echo done November 2022 for lower extremity edema showed EF 63-14%, normal diastolic parameters, no significant valvular abnormalities.  EKG 09/23/2021: Sinus tachycardia.  Rate 114.  PAC.  Probable LAE.  TTE 08/24/2021: 1. Left ventricular ejection fraction, by estimation, is 70 to 75%. The  left ventricle has hyperdynamic function. The left ventricle has no  regional wall motion abnormalities. There is mild left ventricular  hypertrophy. Left ventricular diastolic  parameters were normal.  2. Right ventricular systolic function is normal. The right ventricular  size is normal. Tricuspid regurgitation signal is inadequate for assessing  PA pressure.  3. The mitral valve is normal in structure. No evidence of mitral valve  regurgitation.  4. The aortic valve was not well visualized. Aortic  valve regurgitation  is not visualized. No aortic stenosis is present.  5. The inferior vena cava is normal in size with <50% respiratory  variability, suggesting right atrial pressure of 8 mmHg.   )        Anesthesia Quick Evaluation

## 2022-04-11 NOTE — Progress Notes (Signed)
Anesthesia Chart Review:  Preop labs notable for anemia with hemoglobin 9.0 and thrombocytosis with platelets 779.  Review of previous labs shows this to be chronic with baseline hemoglobin ~9.0 and platelets ~700.  Recently underwent right TKA 12/08/2021 with preop hemoglobin 9.6 and platelets 665.   Hemoglobin (g/dL)  Date Value  04/10/2022 9.0 (L)  02/23/2022 9.2 (L)  12/13/2021 8.0 (L)  12/10/2021 8.4 (L)  12/09/2021 8.5 (L)  12/06/2021 9.6 (L)  09/23/2021 9.0 (L)  08/31/2021 8.8 (L)  08/25/2021 8.9 (L)  08/24/2021 9.4 (L)   Platelets (K/uL)  Date Value  04/10/2022 779 (H)  02/23/2022 728 (H)  12/13/2021 623 (H)  12/10/2021 741 (H)  12/09/2021 772 (H)  12/06/2021 665 (H)  09/23/2021 571 (H)  08/31/2021 505 (H)  08/25/2021 467 (H)  08/24/2021 426 (H)    Recent echo done November 2022 for lower extremity edema showed EF 54-27%, normal diastolic parameters, no significant valvular abnormalities.  EKG 09/23/2021: Sinus tachycardia.  Rate 114.  PAC.  Probable LAE.  TTE 08/24/2021:  1. Left ventricular ejection fraction, by estimation, is 70 to 75%. The  left ventricle has hyperdynamic function. The left ventricle has no  regional wall motion abnormalities. There is mild left ventricular  hypertrophy. Left ventricular diastolic  parameters were normal.   2. Right ventricular systolic function is normal. The right ventricular  size is normal. Tricuspid regurgitation signal is inadequate for assessing  PA pressure.   3. The mitral valve is normal in structure. No evidence of mitral valve  regurgitation.   4. The aortic valve was not well visualized. Aortic valve regurgitation  is not visualized. No aortic stenosis is present.   5. The inferior vena cava is normal in size with <50% respiratory  variability, suggesting right atrial pressure of 8 mmHg.     Wynonia Musty [facility] Short Stay Center/Anesthesiology Phone [phone] 04/11/2022 11:36 AM

## 2022-04-12 ENCOUNTER — Telehealth: Payer: Self-pay | Admitting: *Deleted

## 2022-04-12 MED ORDER — TRANEXAMIC ACID 1000 MG/10ML IV SOLN
2000.0000 mg | INTRAVENOUS | Status: DC
  Filled 2022-04-12: qty 20

## 2022-04-12 NOTE — Care Plan (Signed)
[facility] RNCM call to patient to discuss her upcoming surgery. Discussion had with patient on numerous occasions already. Patient is an Ortho bundle patient through [facility] and agreeable to case management. She lives in [place] facility. She is extremely deconditioned from not ambulating and the Left total knee is being replaced for quality of life. She recently had the Right total knee done and was unable to really walk due to inability to bear weight on the left knee. She anticipates going to a SNF for STR after surgery. She has a W/C, RW already. Reviewed post op care instructions. Will continue to follow for needs.

## 2022-04-12 NOTE — Telephone Encounter (Signed)
[facility] pre-op call completed. 

## 2022-04-13 ENCOUNTER — Other Ambulatory Visit: Payer: Self-pay

## 2022-04-13 ENCOUNTER — Ambulatory Visit (HOSPITAL_BASED_OUTPATIENT_CLINIC_OR_DEPARTMENT_OTHER): Payer: Medicare PPO | Admitting: Anesthesiology

## 2022-04-13 ENCOUNTER — Inpatient Hospital Stay (HOSPITAL_COMMUNITY)
Admission: AD | Admit: 2022-04-13 | Discharge: 2022-04-18 | DRG: 470 | Disposition: A | Discharge status: Skilled Nursing Facility | Payer: Medicare PPO | Attending: Orthopedic Surgery | Admitting: Orthopedic Surgery

## 2022-04-13 ENCOUNTER — Encounter (HOSPITAL_COMMUNITY)
Admission: AD | Disposition: A | Discharge status: Skilled Nursing Facility | Payer: Self-pay | Source: Home / Self Care | Attending: Orthopedic Surgery

## 2022-04-13 ENCOUNTER — Encounter (HOSPITAL_COMMUNITY): Payer: Self-pay | Admitting: Orthopedic Surgery

## 2022-04-13 ENCOUNTER — Ambulatory Visit (HOSPITAL_COMMUNITY): Payer: Medicare PPO | Admitting: Physician Assistant

## 2022-04-13 DIAGNOSIS — E669 Obesity, unspecified: Secondary | ICD-10-CM | POA: Diagnosis present

## 2022-04-13 DIAGNOSIS — Z471 Aftercare following joint replacement surgery: Secondary | ICD-10-CM | POA: Diagnosis not present

## 2022-04-13 DIAGNOSIS — D869 Sarcoidosis, unspecified: Secondary | ICD-10-CM | POA: Diagnosis present

## 2022-04-13 DIAGNOSIS — R278 Other lack of coordination: Secondary | ICD-10-CM | POA: Diagnosis not present

## 2022-04-13 DIAGNOSIS — M179 Osteoarthritis of knee, unspecified: Secondary | ICD-10-CM

## 2022-04-13 DIAGNOSIS — K219 Gastro-esophageal reflux disease without esophagitis: Secondary | ICD-10-CM | POA: Diagnosis present

## 2022-04-13 DIAGNOSIS — G8918 Other acute postprocedural pain: Secondary | ICD-10-CM | POA: Diagnosis not present

## 2022-04-13 DIAGNOSIS — D649 Anemia, unspecified: Secondary | ICD-10-CM

## 2022-04-13 DIAGNOSIS — J45909 Unspecified asthma, uncomplicated: Secondary | ICD-10-CM

## 2022-04-13 DIAGNOSIS — F419 Anxiety disorder, unspecified: Secondary | ICD-10-CM | POA: Diagnosis not present

## 2022-04-13 DIAGNOSIS — Z743 Need for continuous supervision: Secondary | ICD-10-CM | POA: Diagnosis not present

## 2022-04-13 DIAGNOSIS — Z96651 Presence of right artificial knee joint: Secondary | ICD-10-CM | POA: Diagnosis present

## 2022-04-13 DIAGNOSIS — M1712 Unilateral primary osteoarthritis, left knee: Secondary | ICD-10-CM

## 2022-04-13 DIAGNOSIS — Z96652 Presence of left artificial knee joint: Secondary | ICD-10-CM | POA: Diagnosis not present

## 2022-04-13 DIAGNOSIS — R262 Difficulty in walking, not elsewhere classified: Secondary | ICD-10-CM | POA: Diagnosis not present

## 2022-04-13 DIAGNOSIS — M810 Age-related osteoporosis without current pathological fracture: Secondary | ICD-10-CM | POA: Diagnosis present

## 2022-04-13 DIAGNOSIS — R2689 Other abnormalities of gait and mobility: Secondary | ICD-10-CM | POA: Diagnosis not present

## 2022-04-13 DIAGNOSIS — H409 Unspecified glaucoma: Secondary | ICD-10-CM | POA: Diagnosis present

## 2022-04-13 DIAGNOSIS — Z6836 Body mass index (BMI) 36.0-36.9, adult: Secondary | ICD-10-CM

## 2022-04-13 DIAGNOSIS — D509 Iron deficiency anemia, unspecified: Secondary | ICD-10-CM | POA: Diagnosis not present

## 2022-04-13 DIAGNOSIS — Z01818 Encounter for other preprocedural examination: Principal | ICD-10-CM

## 2022-04-13 DIAGNOSIS — R600 Localized edema: Secondary | ICD-10-CM | POA: Diagnosis not present

## 2022-04-13 DIAGNOSIS — Z882 Allergy status to sulfonamides status: Secondary | ICD-10-CM | POA: Diagnosis not present

## 2022-04-13 DIAGNOSIS — Z886 Allergy status to analgesic agent status: Secondary | ICD-10-CM | POA: Diagnosis not present

## 2022-04-13 DIAGNOSIS — M6281 Muscle weakness (generalized): Secondary | ICD-10-CM | POA: Diagnosis not present

## 2022-04-13 DIAGNOSIS — R279 Unspecified lack of coordination: Secondary | ICD-10-CM | POA: Diagnosis not present

## 2022-04-13 DIAGNOSIS — D638 Anemia in other chronic diseases classified elsewhere: Secondary | ICD-10-CM | POA: Diagnosis present

## 2022-04-13 DIAGNOSIS — L8990 Pressure ulcer of unspecified site, unspecified stage: Secondary | ICD-10-CM | POA: Diagnosis not present

## 2022-04-13 DIAGNOSIS — F331 Major depressive disorder, recurrent, moderate: Secondary | ICD-10-CM | POA: Diagnosis not present

## 2022-04-13 DIAGNOSIS — W19XXXA Unspecified fall, initial encounter: Secondary | ICD-10-CM | POA: Diagnosis not present

## 2022-04-13 DIAGNOSIS — M1711 Unilateral primary osteoarthritis, right knee: Secondary | ICD-10-CM | POA: Diagnosis not present

## 2022-04-13 HISTORY — PX: TOTAL KNEE ARTHROPLASTY: SHX125

## 2022-04-13 SURGERY — ARTHROPLASTY, KNEE, TOTAL
Anesthesia: Regional | Site: Knee | Laterality: Left

## 2022-04-13 MED ORDER — FENTANYL CITRATE (PF) 100 MCG/2ML IJ SOLN
INTRAMUSCULAR | Status: AC
  Filled 2022-04-13: qty 2

## 2022-04-13 MED ORDER — PROMETHAZINE HCL 25 MG/ML IJ SOLN: 6.2500 mg | INTRAMUSCULAR | Status: DC | PRN

## 2022-04-13 MED ORDER — CHLORHEXIDINE GLUCONATE 0.12 % MT SOLN
15.0000 mL | Freq: Once | OROMUCOSAL | Status: AC
  Administered 2022-04-13: 15 mL via OROMUCOSAL
  Filled 2022-04-13: qty 15

## 2022-04-13 MED ORDER — HYDROMORPHONE HCL 1 MG/ML IJ SOLN
0.5000 mg | INTRAMUSCULAR | Status: DC | PRN
  Administered 2022-04-13 – 2022-04-14 (×5): 0.5 mg via INTRAVENOUS
  Filled 2022-04-13 (×5): qty 0.5

## 2022-04-13 MED ORDER — ALBUTEROL SULFATE HFA 108 (90 BASE) MCG/ACT IN AERS
INHALATION_SPRAY | RESPIRATORY_TRACT | Status: DC | PRN
  Administered 2022-04-13: 8 via RESPIRATORY_TRACT

## 2022-04-13 MED ORDER — VANCOMYCIN HCL 1000 MG IV SOLR
INTRAVENOUS | Status: DC | PRN
  Administered 2022-04-13: 1000 mg

## 2022-04-13 MED ORDER — PANTOPRAZOLE SODIUM 40 MG PO TBEC
40.0000 mg | DELAYED_RELEASE_TABLET | Freq: Every day | ORAL | Status: DC
  Administered 2022-04-14 – 2022-04-17 (×4): 40 mg via ORAL
  Filled 2022-04-13 (×4): qty 1

## 2022-04-13 MED ORDER — METHOCARBAMOL 500 MG PO TABS
500.0000 mg | ORAL_TABLET | Freq: Four times a day (QID) | ORAL | Status: DC | PRN
  Administered 2022-04-13 – 2022-04-15 (×4): 500 mg via ORAL
  Filled 2022-04-13 (×5): qty 1

## 2022-04-13 MED ORDER — CEFAZOLIN SODIUM-DEXTROSE 2-4 GM/100ML-% IV SOLN
2.0000 g | Freq: Three times a day (TID) | INTRAVENOUS | Status: AC
  Administered 2022-04-14 (×3): 2 g via INTRAVENOUS
  Filled 2022-04-13 (×3): qty 100

## 2022-04-13 MED ORDER — ALBUTEROL SULFATE HFA 108 (90 BASE) MCG/ACT IN AERS
INHALATION_SPRAY | RESPIRATORY_TRACT | Status: AC
  Filled 2022-04-13: qty 6.7

## 2022-04-13 MED ORDER — CLONIDINE HCL (ANALGESIA) 100 MCG/ML EP SOLN
EPIDURAL | Status: DC | PRN
  Administered 2022-04-13: 100 ug

## 2022-04-13 MED ORDER — SODIUM CHLORIDE (PF) 0.9 % IJ SOLN: INTRAMUSCULAR | Status: DC | PRN

## 2022-04-13 MED ORDER — TRANEXAMIC ACID 1000 MG/10ML IV SOLN
INTRAVENOUS | Status: DC | PRN
  Administered 2022-04-13: 2000 mg via TOPICAL

## 2022-04-13 MED ORDER — DULOXETINE HCL 30 MG PO CPEP
30.0000 mg | ORAL_CAPSULE | Freq: Every day | ORAL | Status: DC
  Administered 2022-04-14 – 2022-04-17 (×4): 30 mg via ORAL
  Filled 2022-04-13 (×4): qty 1

## 2022-04-13 MED ORDER — METOCLOPRAMIDE HCL 5 MG PO TABS: 5.0000 mg | ORAL_TABLET | Freq: Three times a day (TID) | ORAL | Status: DC | PRN

## 2022-04-13 MED ORDER — MORPHINE SULFATE (PF) 4 MG/ML IV SOLN
INTRAVENOUS | Status: AC
  Filled 2022-04-13: qty 2

## 2022-04-13 MED ORDER — POVIDONE-IODINE 10 % EX SWAB: 2.0000 | Freq: Once | CUTANEOUS | Status: DC

## 2022-04-13 MED ORDER — BUPIVACAINE LIPOSOME 1.3 % IJ SUSP
INTRAMUSCULAR | Status: AC
  Filled 2022-04-13: qty 20

## 2022-04-13 MED ORDER — METHOCARBAMOL 1000 MG/10ML IJ SOLN: 500.0000 mg | Freq: Four times a day (QID) | INTRAVENOUS | Status: DC | PRN

## 2022-04-13 MED ORDER — FENTANYL CITRATE (PF) 250 MCG/5ML IJ SOLN
INTRAMUSCULAR | Status: AC
  Filled 2022-04-13: qty 5

## 2022-04-13 MED ORDER — VITAMIN D (ERGOCALCIFEROL) 1.25 MG (50000 UNIT) PO CAPS
50000.0000 [IU] | ORAL_CAPSULE | ORAL | Status: DC
  Administered 2022-04-14: 50000 [IU] via ORAL
  Filled 2022-04-13: qty 1

## 2022-04-13 MED ORDER — 0.9 % SODIUM CHLORIDE (POUR BTL) OPTIME
TOPICAL | Status: DC | PRN
  Administered 2022-04-13: 5000 mL

## 2022-04-13 MED ORDER — MIDAZOLAM HCL 2 MG/2ML IJ SOLN: 1.0000 mg | Freq: Once | INTRAMUSCULAR | Status: AC

## 2022-04-13 MED ORDER — ONDANSETRON HCL 4 MG PO TABS: 4.0000 mg | ORAL_TABLET | Freq: Four times a day (QID) | ORAL | Status: DC | PRN

## 2022-04-13 MED ORDER — NAPROXEN 250 MG PO TABS
250.0000 mg | ORAL_TABLET | Freq: Two times a day (BID) | ORAL | Status: DC
  Administered 2022-04-14 – 2022-04-17 (×8): 250 mg via ORAL
  Filled 2022-04-13 (×8): qty 1

## 2022-04-13 MED ORDER — ONDANSETRON HCL 4 MG/2ML IJ SOLN
INTRAMUSCULAR | Status: DC | PRN
  Administered 2022-04-13: 4 mg via INTRAVENOUS

## 2022-04-13 MED ORDER — LORATADINE 10 MG PO TABS
5.0000 mg | ORAL_TABLET | Freq: Every day | ORAL | Status: DC
  Administered 2022-04-14 – 2022-04-17 (×4): 5 mg via ORAL
  Filled 2022-04-13 (×4): qty 1

## 2022-04-13 MED ORDER — ACETAMINOPHEN 500 MG PO TABS
1000.0000 mg | ORAL_TABLET | Freq: Four times a day (QID) | ORAL | Status: AC
  Administered 2022-04-14: 1000 mg via ORAL
  Filled 2022-04-13 (×2): qty 2

## 2022-04-13 MED ORDER — MENTHOL 3 MG MT LOZG: 1.0000 | LOZENGE | OROMUCOSAL | Status: DC | PRN

## 2022-04-13 MED ORDER — SODIUM CHLORIDE 0.9 % IV SOLN: INTRAVENOUS | Status: AC

## 2022-04-13 MED ORDER — OXYCODONE HCL ER 10 MG PO T12A
20.0000 mg | EXTENDED_RELEASE_TABLET | Freq: Every day | ORAL | Status: DC
  Administered 2022-04-14 – 2022-04-17 (×4): 20 mg via ORAL
  Filled 2022-04-13 (×4): qty 2

## 2022-04-13 MED ORDER — LACTATED RINGERS IV SOLN: INTRAVENOUS | Status: DC

## 2022-04-13 MED ORDER — SODIUM CHLORIDE 0.9 % IR SOLN
Status: DC | PRN
  Administered 2022-04-13: 3000 mL

## 2022-04-13 MED ORDER — ALBUMIN HUMAN 5 % IV SOLN: INTRAVENOUS | Status: DC | PRN

## 2022-04-13 MED ORDER — PROPOFOL 1000 MG/100ML IV EMUL
INTRAVENOUS | Status: AC
Start: 2022-04-13 — End: ?
  Filled 2022-04-13: qty 100

## 2022-04-13 MED ORDER — CLONIDINE HCL (ANALGESIA) 100 MCG/ML EP SOLN: EPIDURAL | Status: DC | PRN

## 2022-04-13 MED ORDER — VANCOMYCIN HCL 1000 MG IV SOLR
INTRAVENOUS | Status: AC
Start: 2022-04-13 — End: ?
  Filled 2022-04-13: qty 20

## 2022-04-13 MED ORDER — ORAL CARE MOUTH RINSE: 15.0000 mL | Freq: Once | OROMUCOSAL | Status: AC

## 2022-04-13 MED ORDER — ONDANSETRON HCL 4 MG/2ML IJ SOLN: 4.0000 mg | Freq: Four times a day (QID) | INTRAMUSCULAR | Status: DC | PRN

## 2022-04-13 MED ORDER — DOCUSATE SODIUM 100 MG PO CAPS
100.0000 mg | ORAL_CAPSULE | Freq: Two times a day (BID) | ORAL | Status: DC
Start: 2022-04-13 — End: 2022-04-18
  Administered 2022-04-13 – 2022-04-17 (×9): 100 mg via ORAL
  Filled 2022-04-13 (×9): qty 1

## 2022-04-13 MED ORDER — PHENYLEPHRINE HCL-NACL 20-0.9 MG/250ML-% IV SOLN
INTRAVENOUS | Status: DC | PRN
  Administered 2022-04-13: 50 ug/min via INTRAVENOUS

## 2022-04-13 MED ORDER — DEXAMETHASONE SODIUM PHOSPHATE 10 MG/ML IJ SOLN
INTRAMUSCULAR | Status: DC | PRN
  Administered 2022-04-13: 10 mg via INTRAVENOUS

## 2022-04-13 MED ORDER — FENTANYL CITRATE (PF) 250 MCG/5ML IJ SOLN
INTRAMUSCULAR | Status: DC | PRN
  Administered 2022-04-13: 50 ug via INTRAVENOUS

## 2022-04-13 MED ORDER — ALBUTEROL SULFATE HFA 108 (90 BASE) MCG/ACT IN AERS: 1.0000 | INHALATION_SPRAY | Freq: Four times a day (QID) | RESPIRATORY_TRACT | Status: DC

## 2022-04-13 MED ORDER — OXYCODONE HCL 5 MG PO TABS
5.0000 mg | ORAL_TABLET | ORAL | Status: DC | PRN
  Administered 2022-04-13 – 2022-04-15 (×6): 10 mg via ORAL
  Filled 2022-04-13 (×2): qty 2
  Filled 2022-04-13: qty 1
  Filled 2022-04-13 (×3): qty 2
  Filled 2022-04-13: qty 1
  Filled 2022-04-13: qty 2

## 2022-04-13 MED ORDER — BUPIVACAINE IN DEXTROSE 0.75-8.25 % IT SOLN
INTRATHECAL | Status: DC | PRN
  Administered 2022-04-13: 15 mg via INTRATHECAL

## 2022-04-13 MED ORDER — ARTIFICIAL TEARS OPHTHALMIC OINT
TOPICAL_OINTMENT | OPHTHALMIC | Status: AC
  Filled 2022-04-13: qty 3.5

## 2022-04-13 MED ORDER — CLONIDINE HCL (ANALGESIA) 100 MCG/ML EP SOLN
EPIDURAL | Status: AC
  Filled 2022-04-13: qty 10

## 2022-04-13 MED ORDER — IRRISEPT - 450ML BOTTLE WITH 0.05% CHG IN STERILE WATER, USP 99.95% OPTIME
TOPICAL | Status: DC | PRN
  Administered 2022-04-13: 450 mL

## 2022-04-13 MED ORDER — FENTANYL CITRATE (PF) 100 MCG/2ML IJ SOLN
25.0000 ug | INTRAMUSCULAR | Status: DC | PRN
  Administered 2022-04-13 (×2): 50 ug via INTRAVENOUS

## 2022-04-13 MED ORDER — ASPIRIN 81 MG PO CHEW
81.0000 mg | CHEWABLE_TABLET | Freq: Two times a day (BID) | ORAL | Status: DC
  Administered 2022-04-13 – 2022-04-17 (×8): 81 mg via ORAL
  Filled 2022-04-13 (×8): qty 1

## 2022-04-13 MED ORDER — POVIDONE-IODINE 10 % EX SWAB
2.0000 | Freq: Once | CUTANEOUS | Status: AC
  Administered 2022-04-13: 2 via TOPICAL

## 2022-04-13 MED ORDER — ACETAMINOPHEN 325 MG PO TABS
325.0000 mg | ORAL_TABLET | Freq: Four times a day (QID) | ORAL | Status: DC | PRN
  Administered 2022-04-13: 650 mg via ORAL
  Filled 2022-04-13: qty 2

## 2022-04-13 MED ORDER — PROPOFOL 1000 MG/100ML IV EMUL
INTRAVENOUS | Status: AC
  Filled 2022-04-13: qty 100

## 2022-04-13 MED ORDER — METOCLOPRAMIDE HCL 5 MG/ML IJ SOLN
5.0000 mg | Freq: Three times a day (TID) | INTRAMUSCULAR | Status: DC | PRN
  Filled 2022-04-13: qty 2

## 2022-04-13 MED ORDER — FENTANYL CITRATE (PF) 100 MCG/2ML IJ SOLN
INTRAMUSCULAR | Status: AC
  Administered 2022-04-13: 50 ug via INTRAVENOUS
  Filled 2022-04-13: qty 2

## 2022-04-13 MED ORDER — POVIDONE-IODINE 7.5 % EX SOLN: Freq: Once | CUTANEOUS | Status: DC

## 2022-04-13 MED ORDER — BUPIVACAINE HCL (PF) 0.25 % IJ SOLN
INTRAMUSCULAR | Status: AC
  Filled 2022-04-13: qty 30

## 2022-04-13 MED ORDER — MIDAZOLAM HCL 2 MG/2ML IJ SOLN
INTRAMUSCULAR | Status: AC
  Administered 2022-04-13: 1 mg via INTRAVENOUS
  Filled 2022-04-13: qty 2

## 2022-04-13 MED ORDER — GABAPENTIN 300 MG PO CAPS
300.0000 mg | ORAL_CAPSULE | Freq: Two times a day (BID) | ORAL | Status: DC
  Administered 2022-04-14 – 2022-04-17 (×8): 300 mg via ORAL
  Filled 2022-04-13 (×8): qty 1

## 2022-04-13 MED ORDER — AMISULPRIDE (ANTIEMETIC) 5 MG/2ML IV SOLN: 10.0000 mg | Freq: Once | INTRAVENOUS | Status: DC | PRN

## 2022-04-13 MED ORDER — PROPOFOL 500 MG/50ML IV EMUL
INTRAVENOUS | Status: DC | PRN
  Administered 2022-04-13: 50 ug/kg/min via INTRAVENOUS

## 2022-04-13 MED ORDER — TRANEXAMIC ACID-NACL 1000-0.7 MG/100ML-% IV SOLN
1000.0000 mg | INTRAVENOUS | Status: AC
  Administered 2022-04-13: 1000 mg via INTRAVENOUS
  Filled 2022-04-13: qty 100

## 2022-04-13 MED ORDER — FENTANYL CITRATE (PF) 100 MCG/2ML IJ SOLN: 50.0000 ug | Freq: Once | INTRAMUSCULAR | Status: AC

## 2022-04-13 MED ORDER — PHENOL 1.4 % MT LIQD: 1.0000 | OROMUCOSAL | Status: DC | PRN

## 2022-04-13 MED ORDER — CEFAZOLIN SODIUM-DEXTROSE 2-4 GM/100ML-% IV SOLN
2.0000 g | INTRAVENOUS | Status: AC
  Administered 2022-04-13: 2 g via INTRAVENOUS
  Filled 2022-04-13: qty 100

## 2022-04-13 MED ORDER — ONDANSETRON HCL 4 MG/2ML IJ SOLN
INTRAMUSCULAR | Status: AC
  Filled 2022-04-13: qty 2

## 2022-04-13 SURGICAL SUPPLY — 94 items
BAG COUNTER SPONGE SURGICOUNT (BAG) ×2 IMPLANT
BAG DECANTER FOR FLEXI CONT (MISCELLANEOUS) ×2 IMPLANT
BAG SPNG CNTER NS LX DISP (BAG) ×1
BANDAGE ESMARK 6X9 LF (GAUZE/BANDAGES/DRESSINGS) ×1 IMPLANT
BLADE SAG 18X100X1.27 (BLADE) ×3 IMPLANT
BLADE SAGITTAL (BLADE) ×2
BLADE SAW SGTL 11.0X1.19X90.0M (BLADE) ×1 IMPLANT
BLADE SAW THK.89X75X18XSGTL (BLADE) ×1 IMPLANT
BNDG CMPR 9X6 STRL LF SNTH (GAUZE/BANDAGES/DRESSINGS) ×1
BNDG CMPR MED 15X6 ELC VLCR LF (GAUZE/BANDAGES/DRESSINGS) ×1
BNDG COHESIVE 6X5 TAN STRL LF (GAUZE/BANDAGES/DRESSINGS) ×2 IMPLANT
BNDG ELASTIC 4X5.8 VLCR STR LF (GAUZE/BANDAGES/DRESSINGS) ×1 IMPLANT
BNDG ELASTIC 6X15 VLCR STRL LF (GAUZE/BANDAGES/DRESSINGS) ×2 IMPLANT
BNDG ELASTIC 6X5.8 VLCR STR LF (GAUZE/BANDAGES/DRESSINGS) ×1 IMPLANT
BNDG ESMARK 6X9 LF (GAUZE/BANDAGES/DRESSINGS) ×2
BOWL SMART MIX CTS (DISPOSABLE) IMPLANT
BSPLAT TIB 5D C CMNT STM LT (Knees) ×1 IMPLANT
CANISTER WOUNDNEG PRESSURE 500 (CANNISTER) ×1 IMPLANT
CEMENT BONE SIMPLEX SPEEDSET (Cement) ×2 IMPLANT
CNTNR URN SCR LID CUP LEK RST (MISCELLANEOUS) ×1 IMPLANT
COMP FEM PERSONA SZ5 LT (Joint) ×2 IMPLANT
COMPONENT FEM PERSONA SZ5 LT (Joint) IMPLANT
CONT SPEC 4OZ STRL OR WHT (MISCELLANEOUS) ×2
COVER SURGICAL LIGHT HANDLE (MISCELLANEOUS) ×2 IMPLANT
CUFF TOURN SGL QUICK 34 (TOURNIQUET CUFF) ×2
CUFF TOURN SGL QUICK 42 (TOURNIQUET CUFF) IMPLANT
CUFF TRNQT CYL 34X4.125X (TOURNIQUET CUFF) ×1 IMPLANT
DRAPE INCISE IOBAN 66X45 STRL (DRAPES) IMPLANT
DRAPE ORTHO SPLIT 77X108 STRL (DRAPES) ×6
DRAPE SURG ORHT 6 SPLT 77X108 (DRAPES) ×3 IMPLANT
DRAPE U-SHAPE 47X51 STRL (DRAPES) ×2 IMPLANT
DRESSING PREVENA PLUS CUSTOM (GAUZE/BANDAGES/DRESSINGS) IMPLANT
DRSG AQUACEL AG ADV 3.5X14 (GAUZE/BANDAGES/DRESSINGS) IMPLANT
DRSG PREVENA PLUS CUSTOM (GAUZE/BANDAGES/DRESSINGS) ×2
DURAPREP 26ML APPLICATOR (WOUND CARE) ×4 IMPLANT
ELECT CAUTERY BLADE 6.4 (BLADE) ×2 IMPLANT
ELECT REM PT RETURN 9FT ADLT (ELECTROSURGICAL) ×2
ELECTRODE REM PT RTRN 9FT ADLT (ELECTROSURGICAL) ×1 IMPLANT
GAUZE SPONGE 4X4 12PLY STRL (GAUZE/BANDAGES/DRESSINGS) ×2 IMPLANT
GLOVE BIOGEL PI IND STRL 7.0 (GLOVE) ×1 IMPLANT
GLOVE BIOGEL PI IND STRL 8 (GLOVE) ×1 IMPLANT
GLOVE BIOGEL PI INDICATOR 7.0 (GLOVE) ×1
GLOVE BIOGEL PI INDICATOR 8 (GLOVE) ×1
GLOVE ECLIPSE 7.0 STRL STRAW (GLOVE) ×2 IMPLANT
GLOVE ECLIPSE 8.0 STRL XLNG CF (GLOVE) ×2 IMPLANT
GLOVE SURG ENC MOIS LTX SZ6.5 (GLOVE) ×6 IMPLANT
GOWN STRL REUS W/ TWL LRG LVL3 (GOWN DISPOSABLE) ×3 IMPLANT
GOWN STRL REUS W/TWL LRG LVL3 (GOWN DISPOSABLE) ×6
HANDPIECE INTERPULSE COAX TIP (DISPOSABLE) ×2
HDLS TROCR DRIL PIN KNEE 75 (PIN) ×2
HOOD PEEL AWAY FLYTE STAYCOOL (MISCELLANEOUS) ×6 IMPLANT
IMMOBILIZER KNEE 20 (SOFTGOODS)
IMMOBILIZER KNEE 20 THIGH 36 (SOFTGOODS) IMPLANT
IMMOBILIZER KNEE 22 UNIV (SOFTGOODS) ×1 IMPLANT
IMMOBILIZER KNEE 24 THIGH 36 (MISCELLANEOUS) IMPLANT
IMMOBILIZER KNEE 24 UNIV (MISCELLANEOUS)
INSERT TIB PS CD/3-5 16 LT (Insert) ×1 IMPLANT
KIT BASIN OR (CUSTOM PROCEDURE TRAY) ×2 IMPLANT
KIT DRSG PREVENA PLUS 7DAY 125 (MISCELLANEOUS) ×1 IMPLANT
KIT TURNOVER KIT B (KITS) ×2 IMPLANT
MANIFOLD NEPTUNE II (INSTRUMENTS) ×2 IMPLANT
NDL SPNL 18GX3.5 QUINCKE PK (NEEDLE) ×1 IMPLANT
NEEDLE 22X1 1/2 (OR ONLY) (NEEDLE) ×4 IMPLANT
NEEDLE SPNL 18GX3.5 QUINCKE PK (NEEDLE) ×2 IMPLANT
NS IRRIG 1000ML POUR BTL (IV SOLUTION) ×4 IMPLANT
PACK TOTAL JOINT (CUSTOM PROCEDURE TRAY) ×2 IMPLANT
PAD ARMBOARD 7.5X6 YLW CONV (MISCELLANEOUS) ×4 IMPLANT
PAD CAST 4YDX4 CTTN HI CHSV (CAST SUPPLIES) ×1 IMPLANT
PADDING CAST COTTON 4X4 STRL (CAST SUPPLIES) ×2
PADDING CAST COTTON 6X4 STRL (CAST SUPPLIES) ×2 IMPLANT
PIN DRILL HDLS TROCAR 75 4PK (PIN) IMPLANT
SCREW FEMALE HEX FIX 25X2.5 (ORTHOPEDIC DISPOSABLE SUPPLIES) ×1 IMPLANT
SET HNDPC FAN SPRY TIP SCT (DISPOSABLE) ×1 IMPLANT
SPIKE FLUID TRANSFER (MISCELLANEOUS) ×2 IMPLANT
STEM POLY PAT PLY 32M KNEE (Knees) ×1 IMPLANT
STEM TIB ST PERS 14+30 (Stem) ×1 IMPLANT
STEM TIBIA 5 DEG SZ C L KNEE (Knees) IMPLANT
STRIP CLOSURE SKIN 1/2X4 (GAUZE/BANDAGES/DRESSINGS) ×4 IMPLANT
SUCTION FRAZIER HANDLE 10FR (MISCELLANEOUS) ×2
SUCTION TUBE FRAZIER 10FR DISP (MISCELLANEOUS) ×1 IMPLANT
SUT MNCRL AB 3-0 PS2 18 (SUTURE) ×2 IMPLANT
SUT VIC AB 0 CT1 27 (SUTURE) ×8
SUT VIC AB 0 CT1 27XBRD ANBCTR (SUTURE) ×3 IMPLANT
SUT VIC AB 1 CT1 36 (SUTURE) ×10 IMPLANT
SUT VIC AB 2-0 CT1 27 (SUTURE) ×10
SUT VIC AB 2-0 CT1 TAPERPNT 27 (SUTURE) ×4 IMPLANT
SYR 30ML LL (SYRINGE) ×6 IMPLANT
SYR TB 1ML LUER SLIP (SYRINGE) ×2 IMPLANT
TIBIA STEM 5 DEG SZ C L KNEE (Knees) ×2 IMPLANT
TOWEL GREEN STERILE (TOWEL DISPOSABLE) ×4 IMPLANT
TOWEL GREEN STERILE FF (TOWEL DISPOSABLE) ×4 IMPLANT
TRAY CATH 16FR W/PLASTIC CATH (SET/KITS/TRAYS/PACK) IMPLANT
WATER STERILE IRR 1000ML POUR (IV SOLUTION) IMPLANT
YANKAUER SUCT BULB TIP NO VENT (SUCTIONS) ×2 IMPLANT

## 2022-04-13 NOTE — Progress Notes (Signed)
Per Dr. Marlou Sa, CPM okay to start on Friday 04/14/22.  Also, no x-rays needed in PACU.  Patient resting comfortably.  VSS.  Will continue to monitor.

## 2022-04-13 NOTE — H&P (Addendum)
TOTAL KNEE ADMISSION H&P  Patient is being admitted for left total knee arthroplasty.  Subjective:  Chief Complaint:left knee pain.  HPI: [name], 71 y.o. female, has a history of pain and functional disability in the left knee due to arthritis and has failed non-surgical conservative treatments for greater than 12 weeks to includeNSAID's and/or analgesics, corticosteriod injections, viscosupplementation injections, use of assistive devices, and activity modification.  Onset of symptoms was gradual, starting >10 years ago with rapidlly worsening course since that time. The patient noted no past surgery on the left knee(s).  Patient currently rates pain in the left knee(s) at 10 out of 10 with activity. Patient has night pain, worsening of pain with activity and weight bearing, pain that interferes with activities of daily living, pain with passive range of motion, crepitus, and joint swelling.  Patient has evidence of subchondral cysts, subchondral sclerosis, periarticular osteophytes, joint subluxation, and joint space narrowing by imaging studies. This patient has had  a good result with her right total knee replacement.  She is essentially debilitated because of the left knee arthritis.  She has improved her nutritional status during the last 6 months. . There is no active infection.  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   Foot pain 09/13/2020   Knee pain 08/24/2018   Fall in home 07/14/2015   TIBIALIS TENDINITIS 08/25/2009   CHEST PAIN, OTHER, PAIN 10/21/2008   PEDAL EDEMA 07/22/2008   ANKLE PAIN, BILATERAL  04/01/2008   LOW BACK PAIN 04/01/2008   ACUTE BRONCHITIS 09/23/2007   SARCOIDOSIS 09/02/2007   Asthma 09/02/2007   Diverticulosis of colon 07/11/2007   BACK PAIN, CHRONIC 07/11/2007   Past Medical History:  Diagnosis Date   Acute bronchitis    Allergy    Anxiety    Arthritis    knees   Asthma    Backache, unspecified    Cataract    removed both eyes   Depression    Diverticulitis    Diverticulosis of colon (without mention of hemorrhage)    Edema    GERD (gastroesophageal reflux disease)    Glaucoma    Lumbago    Obesity    Other chest pain    Pain in joint, ankle and foot    Sarcoidosis    Tibialis tendinitis    Unspecified asthma(493.90)     Past Surgical History:  Procedure Laterality Date   ABDOMINAL HYSTERECTOMY     CATARACT EXTRACTION W/PHACO Right 06/24/2014   Procedure: CATARACT EXTRACTION PHACO AND INTRAOCULAR LENS PLACEMENT ([place]) RIGHT EYE WITH GONIOSYNECHIALYSIS;  Surgeon: Marylynn Pearson, MD;  Location: [place];  Service: Ophthalmology;  Laterality: Right;   COLONOSCOPY     EYE SURGERY Left    cataract surgery   HAND SURGERY Right    trigger finger release   IR RADIOLOGIST EVAL & MGMT  11/14/2018   KNEE ARTHROSCOPY Right    TOTAL KNEE ARTHROPLASTY Right 12/08/2021   Procedure: RIGHT TOTAL KNEE ARTHROPLASTY APPLICATION OF WOUND VAC;  Surgeon: Meredith Pel, MD;  Location: [place];  Service: Orthopedics;  Laterality: Right;    Current Facility-Administered Medications  Medication Dose Route Frequency Provider Last Rate Last Admin   0.9 % irrigation (POUR BTL)    PRN Meredith Pel, MD   1,000 mL at 04/13/22 1453   [START ON 04/14/2022] ceFAZolin (ANCEF) IVPB 2g/100 mL premix  2 g Intravenous On Call to OR Magnant, Gerrianne Scale, PA-C       lactated ringers infusion   Intravenous Continuous Annye Asa, MD 10 mL/hr at 04/13/22 1440 Continued from Pre-op at 04/13/22 1440   povidone-iodine (BETADINE) 7.5 % scrub   Topical Once Magnant, Charles L, PA-C        povidone-iodine 10 % swab 2 Application  2 Application Topical Once Magnant, Charles L, PA-C       tranexamic acid (CYKLOKAPRON) 2,000 mg in sodium chloride 0.9 % 50 mL Topical Application  3,382 mg Topical To OR Meredith Pel, MD       tranexamic acid (CYKLOKAPRON) IVPB 1,000 mg  1,000 mg Intravenous To OR Magnant, Charles L, PA-C       Facility-Administered Medications Ordered in Other Encounters  Medication Dose Route Frequency Provider Last Rate Last Admin   cloNIDine (DURACLON) 100 mcg/mL inj- OR Only   Infiltration Anesthesia Intra-op Barnet Glasgow, MD   100 mcg at 04/13/22 1431   Allergies  Allergen Reactions   Ibuprofen Rash   Sulfa Antibiotics Rash    Social History   Tobacco Use   Smoking status: Never   Smokeless tobacco: Never  Substance Use Topics   Alcohol use: Yes    Comment: 1 glass of wine or mixed drink once a week    Family History  Problem Relation Age of Onset   Hypertension Mother    Stroke Mother    Diabetes Father    Sarcoidosis Sister    Colon cancer Neg Hx    Colon polyps Neg Hx    Esophageal cancer Neg Hx    Rectal cancer Neg Hx    Stomach cancer Neg Hx      Review of Systems  Musculoskeletal:  Positive for arthralgias.  All other systems reviewed and are negative.   Objective:  Physical Exam Vitals reviewed.  HENT:     Head: Normocephalic.     Nose: Nose normal.     Mouth/Throat:     Mouth: Mucous membranes are moist.  Eyes:     Pupils: Pupils are equal, round, and reactive to light.  Cardiovascular:     Rate and Rhythm: Normal rate.     Pulses: Normal pulses.  Pulmonary:     Effort: Pulmonary effort is normal.  Abdominal:     General: Abdomen is flat.  Musculoskeletal:     Cervical back: Normal range of motion.  Skin:    General: Skin is warm.     Capillary Refill: Capillary refill takes less than 2 seconds.  Neurological:     General: No focal deficit present.     Mental Status: She is alert.  Psychiatric:         Mood and Affect: Mood normal.   Examination of the left knee demonstrates intact extensor mechanism but severe flexion contracture of about 20 degrees varus alignment present.  Skin intact.  Ankle dorsiflexion intact.  Pedal pulses 2+ out of 4.  No groin pain with internal/external rotation of the leg.  Vital signs in last 24 hours: Temp:  [98.1 F (36.7 C)] 98.1 F (36.7 C) (07/13 1335) Pulse Rate:  [103-116] 103 (07/13 1455) Resp:  [15-20] 15 (07/13 1455) BP: (  111-151)/(69-90) 111/75 (07/13 1455) SpO2:  [95 %-98 %] 97 % (07/13 1455)  Labs:   Estimated body mass index is 37.67 kg/m as calculated from the following:   Height as of 04/10/22: 5' (1.524 m).   Weight as of 12/10/21: 87.5 kg.   Imaging Review Plain radiographs demonstrate severe degenerative joint disease of the left knee(s). The overall alignment issignificant varus. The bone quality appears to be good for age and reported activity level.      Assessment/Plan:  End stage arthritis, left knee   The patient history, physical examination, clinical judgment of the provider and imaging studies are consistent with end stage degenerative joint disease of the left knee(s) and total knee arthroplasty is deemed medically necessary. The treatment options including medical management, injection therapy arthroscopy and arthroplasty were discussed at length. The risks and benefits of total knee arthroplasty were presented and reviewed. The risks due to aseptic loosening, infection, stiffness, patella tracking problems, thromboembolic complications and other imponderables were discussed. The patient acknowledged the explanation, agreed to proceed with the plan and consent was signed. Patient is being admitted for inpatient treatment for surgery, pain control, PT, OT, prophylactic antibiotics, VTE prophylaxis, progressive ambulation and ADL's and discharge planning. The patient is planning to be discharged to skilled nursing  facility    Anticipated LOS equal to or greater than 2 midnights due to - Age 68 and older with one or more of the following:  - Obesity  - Expected need for hospital services (PT, OT, Nursing) required for safe  discharge  - Anticipated need for postoperative skilled nursing care or inpatient rehab  -Severe deconditioning and minimal family support OR   - Unanticipated findings during/Post Surgery: None  - Patient is a high risk of re-admission due to: Barriers to post-acute care (logistical, no family support in home)

## 2022-04-13 NOTE — Anesthesia Procedure Notes (Addendum)
Anesthesia Regional Block: Adductor canal block   Pre-Anesthetic Checklist: , timeout performed,  Correct Patient, Correct Site, Correct Laterality,  Correct Procedure, Correct Position, site marked,  Risks and benefits discussed,  Surgical consent,  Pre-op evaluation,  At surgeon's request and post-op pain management  Laterality: Lower and Left  Prep: chloraprep       Needles:  Injection technique: Single-shot  Needle Type: Echogenic Needle     Needle Length: 9cm  Needle Gauge: 22     Additional Needles:   Procedures:,,,, ultrasound used (permanent image in chart),,    Narrative:  Start time: 04/13/2022 2:27 PM End time: 04/13/2022 2:31 PM Injection made incrementally with aspirations every 5 mL.  Performed by: Personally  Anesthesiologist: Barnet Glasgow, MD  Additional Notes: Block assessed prior to surgery. Pt tolerated procedure well.

## 2022-04-13 NOTE — Anesthesia Postprocedure Evaluation (Signed)
Anesthesia Post Note  Patient: [name]  Procedure(s) Performed: LEFT TOTAL KNEE ARTHROPLASTY (Left: Knee)     Patient location during evaluation: PACU Anesthesia Type: Regional and Spinal Level of consciousness: awake and alert Pain management: pain level controlled Vital Signs Assessment: post-procedure vital signs reviewed and stable Respiratory status: spontaneous breathing and respiratory function stable Cardiovascular status: blood pressure returned to baseline and stable Postop Assessment: spinal receding Anesthetic complications: no   No notable events documented.  Last Vitals:  Vitals:   04/13/22 2000 04/13/22 2015  BP: (!) 144/80 130/72  Pulse: (!) 101   Resp: 17   Temp:  36.6 C  SpO2: 95%     Last Pain:  Vitals:   04/13/22 2015  PainSc: 4                  Nazareth Norenberg DANIEL

## 2022-04-13 NOTE — Brief Op Note (Signed)
   04/13/2022  6:47 PM  PATIENT:  [name]  71 y.o. female  PRE-OPERATIVE DIAGNOSIS:  left knee osteoarthritis  POST-OPERATIVE DIAGNOSIS:  left knee osteoarthritis  PROCEDURE:  Procedure(s): LEFT TOTAL KNEE ARTHROPLASTY  SURGEON:  Surgeon(s): Meredith Pel, MD  ASSISTANT: magnant pa  ANESTHESIA:   spinal  EBL: 50 ml    Total I/O In: 1350 [I.V.:1000; IV Piggyback:350] Out: 850 [Urine:800; Blood:50]  BLOOD ADMINISTERED: none  DRAINS: none   LOCAL MEDICATIONS USED:  marcaine mso4 clonidine vanco exparel  SPECIMEN:  No Specimen  COUNTS:  YES  TOURNIQUET:   Total Tourniquet Time Documented: Thigh (Left) - 103 minutes Total: Thigh (Left) - 103 minutes   DICTATION: .Other Dictation: Dictation Number [id number]  PLAN OF CARE: Admit to inpatient   PATIENT DISPOSITION:  PACU - hemodynamically stable

## 2022-04-13 NOTE — Op Note (Signed)
NAMEJANESSA, [name] MEDICAL RECORD NO: [id number] ACCOUNT NO: [id number] DATE OF BIRTH: 1950-11-07 FACILITY: [facility] LOCATION: [facility] PHYSICIAN: Yetta Barre. Marlou Sa, MD  Operative Report   PREOPERATIVE DIAGNOSIS:  Left knee arthritis.  POSTOPERATIVE DIAGNOSIS:  Left knee arthritis.  PROCEDURE:  Left total knee replacement using Persona cemented posterior stabilized femur, size 5 narrow, size C cemented tibia with 30 mm stem extender, 32 mm cemented patella and a 16 mm constrained posterior stabilized tibial insert.  SURGEON:  Yetta Barre. Marlou Sa, MD  ASSISTANT:  Annie Main.  INDICATIONS:  The patient is a 71 year old patient with end-stage left knee arthritis, who presents for operative management after explanation of risks and benefits.  The patient has been quite immobile and deconditioned over the past several months due  to inability to weightbear on that left leg.  She presents now for operative management after explanation of risks and benefits.  PROCEDURE IN DETAIL:  The patient was brought to the operating room where spinal anesthetic was induced.  Preoperative antibiotics administered.  Timeout was called.  Left knee had about 20-25 degree flexion contracture.  Left leg was prescrubbed with  alcohol and Betadine, allowed to air dry.  Prepped with DuraPrep solution and draped in sterile manner.  Ioban used to cover the operative field.  Timeout was called.  Leg was elevated and exsanguinated with the Esmarch wrap.  Total tourniquet time 103  minutes at 300 mmHg.  Anterior approach to the knee was made.  IrriSept solution utilized. Median parapatellar arthrotomy was made.  The quad tendon proximally was very diminutive.  Arthrotomy location marked with #1 Vicryl suture.  Fat pad partially  excised.  Severe end-stage inflammatory arthritis was present and bone quality was poor.  Lateral patellofemoral ligament was released.  Minimal medial soft tissue dissection was performed.  Anterior  distal soft tissue from the femur was also removed.   Next, the patella was everted.  Knee was flexed.  Tibial cut was made perpendicular to the mechanical axis using intramedullary alignment, and 7 degrees of slope.  Initially, a 2 mm cut was made off the tibia, but that was later revised.  A 4 mm cut was  made off the most affected medial tibial plateau.  This was later revised 4 more millimeters for an approximate measured cut of 11 mm.  A 12 mm cut was made off the distal femur.  Next, the femur was sized to a size 5.  Anterior, posterior and chamfer  cuts were made. Based on bone quality as well as apparent valgus tightness in the knee, decision was made to do posterior cruciate sacrificing constrained implant.  Box cut was made.  Tibial baseplate was placed in the appropriate rotation.  Bone quality  again was rather poor due to deconditioning and nonload bearing.  Trial components placed on the femur and tibia and the patient did achieve near full extension with a 16 mm spacer and had good stability to varus and valgus stress.  Lateral release was  required due to the valgus deformity.  Patella was then cut down using an oscillating saw and a patellar button was placed, which restored the height to 22 mm, which was its original height.  With the trial components in position, the patient achieved  near full extension, excellent flexion, and good patellar tracking using no thumbs technique.  Next, trial components were removed.  Thorough irrigation was performed.  Bone plugs were placed in the femur and tibia.  The 3 liters of pulsatile  irrigation  was utilized.  Then, the capsule was anesthetized using a combination of Marcaine, Exparel and saline.  Next, tranexamic acid sponge and IrriSept solution allowed to sit in the incision for 3 minutes.  Next, both canals were irrigated with IrriSept  solution.  Vancomycin powder placed onto the dried cut bony surfaces.  The components were cemented into  position and cement was allowed to harden.  Same stability parameters were maintained. Excess cement was removed.  Next, tourniquet was released.   Bleeding points encountered controlled using electrocautery.  Pouring irrigation x4 liters then utilized.  Median parapatellar arthrotomy was then closed over bolster using #1 Vicryl suture.  Prior to final arthrotomy closure, the joint was suctioned  out, irrigated with IrriSept solution, suctioned out and then vancomycin powder placed and the arthrotomy was closed using #1 Vicryl suture.  Next, IrriSept solution, vancomycin powder placed above that layer.  Solution of Marcaine, morphine, clonidine  injected into the knee for postoperative pain relief Next, the incision was closed using interrupted inverted 0 Vicryl suture, 2-0 Vicryl suture and 3-0 Monocryl.  Incisional wound VAC applied due to high risk incision.  A knee immobilizer applied.  The  patient tolerated the procedure well without immediate complication.  Luke's assistance was required at all times for opening, closing, mobilization of tissue, drilling, pinning.  His assistance was a medical necessity during the entire case.   NIK D: 04/13/2022 6:55:03 pm T: 04/13/2022 11:47:00 pm  JOB: [id number]/ [id number]

## 2022-04-13 NOTE — Transfer of Care (Signed)
Immediate Anesthesia Transfer of Care Note  Patient: [name]  Procedure(s) Performed: LEFT TOTAL KNEE ARTHROPLASTY (Left: Knee)  Patient Location: PACU  Anesthesia Type:Spinal with sedation  Level of Consciousness: awake and alert   Airway & Oxygen Therapy: Patient Spontanous Breathing and Patient connected to face mask oxygen  Post-op Assessment: Report given to RN and Post -op Vital signs reviewed and stable  Post vital signs: Reviewed and stable  Last Vitals:  Vitals Value Taken Time  BP 134/81 04/13/22 1923  Temp    Pulse 100 04/13/22 1924  Resp 20 04/13/22 1924  SpO2 97 % 04/13/22 1924  Vitals shown include unvalidated device data.  Last Pain:  Vitals:   04/13/22 1346  PainSc: 3       Patients Stated Pain Goal: 0 (79/98/72 1587)  Complications: No notable events documented.

## 2022-04-13 NOTE — Anesthesia Procedure Notes (Addendum)
Spinal  Patient location during procedure: OR Start time: 04/13/2022 3:44 PM End time: 04/13/2022 4:00 PM Reason for block: surgical anesthesia Staffing Performed: anesthesiologist  Anesthesiologist: Barnet Glasgow, MD Performed by: Barnet Glasgow, MD Authorized by: Barnet Glasgow, MD   Preanesthetic Checklist Completed: patient identified, IV checked, risks and benefits discussed, surgical consent, monitors and equipment checked, pre-op evaluation and timeout performed Spinal Block Patient position: right lateral decubitus Prep: DuraPrep and site prepped and draped Patient monitoring: heart rate, cardiac monitor, continuous pulse ox and blood pressure Approach: midline Location: L3-4 Injection technique: single-shot Needle Needle type: Quincke  Needle gauge: 22 G Needle length: 10 cm Needle insertion depth: 7 cm Assessment Sensory level: T4 Events: CSF return Additional Notes  4 Attempt (s). Pt tolerated procedure well.

## 2022-04-14 LAB — CBC
HCT: 25.9 % — ABNORMAL LOW (ref 36.0–46.0)
Hemoglobin: 7.8 g/dL — ABNORMAL LOW (ref 12.0–15.0)
MCH: 25.7 pg — ABNORMAL LOW (ref 26.0–34.0)
MCHC: 30.1 g/dL (ref 30.0–36.0)
MCV: 85.5 fL (ref 80.0–100.0)
Platelets: 640 10*3/uL — ABNORMAL HIGH (ref 150–400)
RBC: 3.03 MIL/uL — ABNORMAL LOW (ref 3.87–5.11)
RDW: 18.9 % — ABNORMAL HIGH (ref 11.5–15.5)
WBC: 11.4 10*3/uL — ABNORMAL HIGH (ref 4.0–10.5)
nRBC: 0 % (ref 0.0–0.2)

## 2022-04-14 MED ORDER — ADULT MULTIVITAMIN W/MINERALS CH
1.0000 | ORAL_TABLET | Freq: Every day | ORAL | Status: DC
  Administered 2022-04-14 – 2022-04-17 (×4): 1 via ORAL
  Filled 2022-04-14 (×4): qty 1

## 2022-04-14 NOTE — Progress Notes (Signed)
PT recommendation for SNF noted. Pt admitted from [facility], has previous SNF stay at [facility]. [employer] auth request submitted for SNF (ref # [id number]) and appropriate clinicals faxed. SNF search started, will f/u with offers as available.   Wandra Feinstein, MSW, LCSW [phone] (coverage)

## 2022-04-14 NOTE — Progress Notes (Signed)
  Subjective: [name] is a 71 y.o. female s/p left TKA.  They are POD 1.  Pt's pain is controlled.  Pt denies numbness/tingling/weakness.  Pt has not ambulated yet.  Denies any chest pain, shortness of breath, calf pain, abdominal pain.  Pain is controlled overall.  No fevers or chills.  No significant drainage in wound VAC canister..     Objective: Vital signs in last 24 hours: Temp:  [97.5 F (36.4 C)-98.5 F (36.9 C)] 98.4 F (36.9 C) (07/14 0758) Pulse Rate:  [94-116] 94 (07/14 0758) Resp:  [15-20] 18 (07/14 0758) BP: (111-151)/(68-90) 117/72 (07/14 0758) SpO2:  [94 %-100 %] 100 % (07/14 0758) Weight:  [85.9 kg] 85.9 kg (07/14 0220)  Intake/Output from previous day: 07/13 0701 - 07/14 0700 In: 2050 [I.V.:1700; IV Piggyback:350] Out: 1500 [Urine:1450; Blood:50] Intake/Output this shift: No intake/output data recorded.  Exam:  No gross blood or drainage overlying the dressing 2+ DP pulse Sensation intact distally in the left foot Able to dorsiflex and plantarflex the left foot No calf tenderness.  Negative Homans' sign.  Not able to perform straight leg raise.   Labs: Recent Labs    04/14/22 0307  HGB 7.8*   Recent Labs    04/14/22 0307  WBC 11.4*  RBC 3.03*  HCT 25.9*  PLT 640*   No results for input(s): "NA", "K", "CL", "CO2", "BUN", "CREATININE", "GLUCOSE", "CALCIUM" in the last 72 hours. No results for input(s): "LABPT", "INR" in the last 72 hours.  Assessment/Plan: Pt is POD 1 s/p left TKA.    -Plan to discharge to SNF in coming days pending patient's pain and PT eval  -WBAT with a walker  -She is severely deconditioned and has not really been able to weight-bear through this left lower extremity for months due to the severity of her arthritis.  She will need discharge to skilled nursing facility as she has no help at home.  When she is discharged, she will need portable Prevena wound VAC.  Follow-up with Dr. Marlou Sa in clinic 2 weeks postoperatively  and we will remove the wound VAC sponge at that time.   Thaine Garriga L Sharion Grieves 04/14/2022, 9:52 AM

## 2022-04-14 NOTE — Progress Notes (Addendum)
Initial Nutrition Assessment  DOCUMENTATION CODES:   Not applicable  INTERVENTION:   MVI with minerals daily.  Patient declines all supplements at this time.   Recommend adding Lactaid with meals so patient can have magic cup supplements.  NUTRITION DIAGNOSIS:   Increased nutrient needs related to post-op healing as evidenced by estimated needs.  GOAL:   Patient will meet greater than or equal to 90% of their needs  MONITOR:   PO intake  REASON FOR ASSESSMENT:   Malnutrition Screening Tool    ASSESSMENT:   71 yo female admitted with L knee pain. PMH includes asthma, diverticulosis, lactose intolerance, sarcoidosis, glaucoma, arthritis, GERD.  Patient reports losing some weight recently, but unable to quantify. She has had a decreased appetite r/t pain. Since admission, she has been eating okay, but not great. She declines all PO supplements offered because she does not want to be charged for them. She would not tolerate Magic cup and Vital Cuisine shakes from the kitchen because they contain lactose. She eats ice cream at home after she takes Lactaid.   Labs reviewed.  Medications reviewed and include Colace, vitamin D.  Weight history reviewed. Weight has gradually declined over the past year d/t decreased intake. She has lost 33 lbs (15%) over the past year, which is not significant for the time frame.   NUTRITION - FOCUSED PHYSICAL EXAM:  Patient declined  Diet Order:   Diet Order             Diet regular Room service appropriate? Yes; Fluid consistency: Thin  Diet effective now                   EDUCATION NEEDS:   No education needs have been identified at this time  Skin:  Skin Assessment: Reviewed RN Assessment  Last BM:  no BM documented  Height:   Ht Readings from Last 1 Encounters:  04/14/22 5' (1.524 m)    Weight:   Wt Readings from Last 1 Encounters:  04/14/22 85.9 kg     BMI:  Body mass index is 36.98 kg/m.  Estimated  Nutritional Needs:   Kcal:  1800-2000  Protein:  90-100 gm  Fluid:  1.8-2 L    Lucas Mallow RD, LDN, CNSC Please refer to Amion for contact information.

## 2022-04-14 NOTE — Progress Notes (Signed)
Two white pills were found in patient's bed. Found to be oxycodone pills when confirmed with pharmacy. Wasted the pills with charge nurse and did safety zone portal as directed by pharmacy.

## 2022-04-14 NOTE — NC FL2 (Signed)
[place] LEVEL OF CARE SCREENING TOOL     IDENTIFICATION  Patient Name: [name] Birthdate: 1951-04-14 Sex: female Admission Date (Current Location): 04/13/2022  [facility] and [place] Number:  Herbalist and Address:  [facility], [place] [address], [place], [place] [address]      Provider Number: [id number]  Attending Physician Name and Address:  Meredith Pel, MD  Relative Name and Phone Number:       Current Level of Care: Hospital Recommended Level of Care: [place] Prior Approval Number:    Date Approved/Denied:   PASRR Number: [id number]  Discharge Plan: SNF    Current Diagnoses: Patient Active Problem List   Diagnosis Date Noted   DJD (degenerative joint disease) of knee 04/13/2022   S/P TKR (total knee replacement), left 04/13/2022   Arthritis of right knee    S/P total knee arthroplasty, right 12/08/2021   Pressure injury of skin 08/27/2021   Fall 08/23/2021   Difficulty in walking 08/23/2021   Bilateral lower extremity edema 08/23/2021   Hormone replacement therapy 08/23/2021   Osteoporosis 01/28/2021   Osteoarthritis of right wrist 01/28/2021   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   Foot pain 09/13/2020   Knee pain 08/24/2018   Fall in home 07/14/2015   TIBIALIS TENDINITIS 08/25/2009   CHEST PAIN, OTHER, PAIN 10/21/2008   PEDAL EDEMA 07/22/2008   ANKLE PAIN, BILATERAL 04/01/2008   LOW BACK PAIN 04/01/2008   ACUTE BRONCHITIS 09/23/2007   SARCOIDOSIS 09/02/2007   Asthma 09/02/2007   Diverticulosis of colon 07/11/2007   BACK PAIN, CHRONIC 07/11/2007    Orientation RESPIRATION BLADDER Height & Weight     Self, Time, Situation, Place  Normal Continent Weight: 189 lb 6 oz (85.9 kg) Height:  5' (152.4 cm)  BEHAVIORAL SYMPTOMS/MOOD NEUROLOGICAL BOWEL NUTRITION STATUS      Continent    AMBULATORY STATUS COMMUNICATION OF NEEDS Skin   Extensive Assist Verbally Surgical wounds                        Personal Care Assistance Level of Assistance  Bathing, Dressing, Feeding Bathing Assistance: Maximum assistance Feeding assistance: Limited assistance Dressing Assistance: Maximum assistance     Functional Limitations Info  Sight, Hearing, Speech Sight Info: Adequate Hearing Info: Adequate Speech Info: Adequate    SPECIAL CARE FACTORS FREQUENCY  PT (By licensed PT), OT (By licensed OT)                    Contractures Contractures Info: Not present    Additional Factors Info  Code Status Code Status Info: FULL CODE             Current Medications (04/14/2022):  This is the current hospital active medication list Current Facility-Administered Medications  Medication Dose Route Frequency Provider Last Rate Last Admin   acetaminophen (TYLENOL) tablet 1,000 mg  1,000 mg Oral Q6H Magnant, Charles L, PA-C   1,000 mg at 04/14/22 4235   acetaminophen (TYLENOL) tablet 325-650 mg  325-650 mg Oral Q6H PRN Magnant, Charles L, PA-C   650 mg at 04/13/22 2212   aspirin chewable tablet 81 mg  81 mg Oral BID Magnant, Charles L, PA-C   81 mg at 04/14/22 1005   ceFAZolin (ANCEF) IVPB 2g/100 mL premix  2 g Intravenous Q8H Magnant, Charles L, PA-C 200 mL/hr at 04/14/22 0939 2 g at 04/14/22 0939   docusate sodium (COLACE) capsule 100  mg  100 mg Oral BID Magnant, Charles L, PA-C   100 mg at 04/14/22 1005   DULoxetine (CYMBALTA) DR capsule 30 mg  30 mg Oral Daily Magnant, Charles L, PA-C   30 mg at 04/14/22 1005   gabapentin (NEURONTIN) capsule 300 mg  300 mg Oral BID Magnant, Charles L, PA-C   300 mg at 04/14/22 1005   HYDROmorphone (DILAUDID) injection 0.5 mg  0.5 mg Intravenous Q4H PRN Magnant, Charles L, PA-C   0.5 mg at 04/14/22 0630   loratadine (CLARITIN) tablet 5 mg  5 mg Oral Daily Magnant, Charles L, PA-C   5 mg at 04/14/22 1005   menthol-cetylpyridinium (CEPACOL) lozenge 3 mg  1 lozenge Oral PRN Magnant, Charles L, PA-C       Or   phenol (CHLORASEPTIC) mouth  spray 1 spray  1 spray Mouth/Throat PRN Magnant, Charles L, PA-C       methocarbamol (ROBAXIN) tablet 500 mg  500 mg Oral Q6H PRN Magnant, Charles L, PA-C   500 mg at 04/14/22 0427   Or   methocarbamol (ROBAXIN) 500 mg in dextrose 5 % 50 mL IVPB  500 mg Intravenous Q6H PRN Magnant, Charles L, PA-C       metoCLOPramide (REGLAN) tablet 5-10 mg  5-10 mg Oral Q8H PRN Magnant, Charles L, PA-C       Or   metoCLOPramide (REGLAN) injection 5-10 mg  5-10 mg Intravenous Q8H PRN Magnant, Charles L, PA-C       naproxen (NAPROSYN) tablet 250 mg  250 mg Oral BID WC Magnant, Charles L, PA-C   250 mg at 04/14/22 1005   ondansetron (ZOFRAN) tablet 4 mg  4 mg Oral Q6H PRN Magnant, Charles L, PA-C       Or   ondansetron (ZOFRAN) injection 4 mg  4 mg Intravenous Q6H PRN Magnant, Charles L, PA-C       oxyCODONE (Oxy IR/ROXICODONE) immediate release tablet 5-10 mg  5-10 mg Oral Q4H PRN Magnant, Charles L, PA-C   10 mg at 04/14/22 0427   oxyCODONE (OXYCONTIN) 12 hr tablet 20 mg  20 mg Oral Daily Magnant, Charles L, PA-C   20 mg at 04/14/22 1005   pantoprazole (PROTONIX) EC tablet 40 mg  40 mg Oral Daily Magnant, Charles L, PA-C   40 mg at 04/14/22 1004   Vitamin D (Ergocalciferol) (DRISDOL) capsule 50,000 Units  50,000 Units Oral Q Fri Magnant, Gerrianne Scale, PA-C   50,000 Units at 04/14/22 1005     Discharge Medications: Please see discharge summary for a list of discharge medications.  Relevant Imaging Results:  Relevant Lab Results:   Additional Information SSN: [id number] [place], [place]

## 2022-04-14 NOTE — Evaluation (Signed)
Physical Therapy Evaluation Patient Details Name: [name] MRN: [id number] DOB: Nov 27, 1950 Today's Date: 04/14/2022  History of Present Illness  71 y.o. female s/p Left TKA 04/13/22. Hx of RTKA 12/08/21. PMH: Sarcoidosis, osteoporosis, falls, chronic backpain.  Clinical Impression  Pt is s/p TKA presenting with the deficits listed below (see PT Problem List). Extensive education for post-op TKA including positioning, precautions, and exercises. Handouts provided and reviewed. Attempted sit<>stand with RW but unable to fully rise to upright position. Required Stedy with Max assist and knee block (able to work on standing tolerance.) OOB in chair. Spoke with RN - maxi lift pad in place for transfer back to bed if pt unable to rise from lower surface of recliner. Pt will benefit from skilled PT to increase their independence and safety with mobility to allow discharge to the venue listed below.         Recommendations for follow up therapy are one component of a multi-disciplinary discharge planning process, led by the attending physician.  Recommendations may be updated based on patient status, additional functional criteria and insurance authorization.  Follow Up Recommendations Skilled nursing-short term rehab (<3 hours/day) ((per physician recs as well.)) Can patient physically be transported by private vehicle: No    Assistance Recommended at Discharge Frequent or constant Supervision/Assistance  Patient can return home with the following       Equipment Recommendations None recommended by PT  Recommendations for Other Services  OT consult (Very weak grip bil)    Functional Status Assessment Patient has had a recent decline in their functional status and demonstrates the ability to make significant improvements in function in a reasonable and predictable amount of time.     Precautions / Restrictions Precautions Precautions: Knee;Fall Precaution Booklet Issued: Yes  (comment) Precaution Comments: reviewed Required Braces or Orthoses: Other Brace Other Brace: Bone foam, KI in room, CPM machine 10-40 deg post op increase 10 deg ea day. Restrictions Weight Bearing Restrictions: No LLE Weight Bearing: Weight bearing as tolerated      Mobility  Bed Mobility Overal bed mobility: Needs Assistance Bed Mobility: Supine to Sit     Supine to sit: Mod assist, HOB elevated     General bed mobility comments: Educated on technique, required mod assist for LEs and trunk to rise to EOB, pt locking elbows with therapist to pull (UE weakness significant and unable to grip appropriately with hand.    Transfers Overall transfer level: Needs assistance Equipment used: Rolling walker (2 wheels), Ambulation equipment used Transfers: Sit to/from Stand, Bed to chair/wheelchair/BSC Sit to Stand: Total assist, From elevated surface, Max assist           General transfer comment: Initially performed sit<>stand from elevated bed surface, pt tolerated 3/4 stand but unable to fully stand upright despite cues to bring hips forward. Max assist for boost to stand from bed with Stedy, using UEs as able (weak.) Transfer via Lift Equipment: Stedy  Ambulation/Gait             Pre-gait activities: Focused on standing with stedy, unable to move knees away from knee pad, LEs fatigue quickly and UEs weak having difficulty extending and holding trunk upright.    Stairs            Wheelchair Mobility    Modified Rankin (Stroke Patients Only)       Balance Overall balance assessment: Needs assistance Sitting-balance support: No upper extremity supported, Feet supported Sitting balance-Leahy Scale: [place]     Standing  balance support: Bilateral upper extremity supported, Reliant on assistive device for balance Standing balance-Leahy Scale: Zero                               Pertinent Vitals/Pain Pain Assessment Pain Assessment: 0-10 Pain  Score: 4  Pain Location: Lt knee Pain Descriptors / Indicators: Aching Pain Intervention(s): Limited activity within patient's tolerance, Monitored during session, Patient requesting pain meds-RN notified, Repositioned    Home Living Family/patient expects to be discharged to:: Skilled nursing facility Living Arrangements: Alone                 Additional Comments: Plan is to go to SNF and then return home. Apparently was d/c from SNF to ALF after prior knee surgery due to significant difficulty bearing weight on LLE. Normally, lives at home by herself and has a ramped entrance. Patient has w/c, RW, BSC, adaptive equipment (sock aide, reacher). Recently living at ALF for respite care .    Prior Function Prior Level of Function : Needs assist       Physical Assist : Mobility (physical);ADLs (physical) Mobility (physical): Bed mobility;Transfers   Mobility Comments: Patient reports ALF was using hoyer lift and Stedy type device for transfers PTA. ADLs Comments: Required assistance from staff to perform ADLs     Hand Dominance   Dominant Hand: Right    Extremity/Trunk Assessment   Upper Extremity Assessment Upper Extremity Assessment: Defer to OT evaluation    Lower Extremity Assessment Lower Extremity Assessment: LLE deficits/detail;Generalized weakness LLE Deficits / Details: Unable to SLR without <10 deg lag LLE: Unable to fully assess due to pain LLE Sensation: WNL       Communication   Communication: No difficulties  Cognition Arousal/Alertness: Awake/alert Behavior During Therapy: WFL for tasks assessed/performed Overall Cognitive Status: Impaired/Different from baseline Area of Impairment: Orientation, Problem solving                 Orientation Level: Disoriented to, Place, Time, Situation           Problem Solving: Slow processing General Comments: Pt aware of confusion. Unable tor recall her birth year, or where she was initially but a little  improved following tx.        General Comments General comments (skin integrity, edema, etc.): Extensive education for Lt knee positioning. VSS throughout.    Exercises Total Joint Exercises Ankle Circles/Pumps: AROM, Both, 10 reps, Seated Quad Sets: Strengthening, Both, 5 reps, Seated Gluteal Sets: Strengthening, Both, 5 reps, Seated Towel Squeeze: Strengthening, Both, 5 reps, Seated Short Arc Quad: Strengthening, Both, 5 reps, Seated Heel Slides: AAROM, 5 reps, Seated, Left Hip ABduction/ADduction: AAROM, Left, 5 reps, Seated, Strengthening Straight Leg Raises: AAROM, Strengthening, Left, 5 reps, Supine   Assessment/Plan    PT Assessment Patient needs continued PT services  PT Problem List Decreased strength;Decreased range of motion;Decreased activity tolerance;Decreased balance;Decreased mobility;Decreased knowledge of use of DME;Decreased cognition;Decreased safety awareness;Decreased knowledge of precautions;Obesity;Pain       PT Treatment Interventions DME instruction;Gait training;Functional mobility training;Therapeutic activities;Therapeutic exercise;Balance training;Neuromuscular re-education;Cognitive remediation;Patient/family education;Modalities    PT Goals (Current goals can be found in the Care Plan section)  Acute Rehab PT Goals Patient Stated Goal: Get well, live independently again PT Goal Formulation: With patient Time For Goal Achievement: 04/21/22 Potential to Achieve Goals: Fair    Frequency 7X/week     Co-evaluation  AM-PAC PT "6 Clicks" Mobility  Outcome Measure Help needed turning from your back to your side while in a flat bed without using bedrails?: A Lot Help needed moving from lying on your back to sitting on the side of a flat bed without using bedrails?: A Lot Help needed moving to and from a bed to a chair (including a wheelchair)?: Total Help needed standing up from a chair using your arms (e.g., wheelchair or bedside  chair)?: Total Help needed to walk in hospital room?: Total Help needed climbing 3-5 steps with a railing? : Total 6 Click Score: 8    End of Session Equipment Utilized During Treatment: Gait belt Activity Tolerance: Patient tolerated treatment well Patient left: in chair;with call bell/phone within reach;with chair alarm set;with nursing/sitter in room;with SCD's reapplied Nurse Communication: Mobility status;Patient requests pain meds PT Visit Diagnosis: Unsteadiness on feet (R26.81);Muscle weakness (generalized) (M62.81);History of falling (Z91.81);Difficulty in walking, not elsewhere classified (R26.2);Pain Pain - Right/Left: Left Pain - part of body: Knee    Time: 6546-5035 PT Time Calculation (min) (ACUTE ONLY): 54 min   Charges:   PT Evaluation $PT Eval Moderate Complexity: 1 Mod PT Treatments $Therapeutic Exercise: 8-22 mins $Therapeutic Activity: 23-37 mins        [name], PT   [name] 04/14/2022, 12:18 PM

## 2022-04-14 NOTE — Plan of Care (Signed)
  Problem: Education: Goal: Knowledge of General Education information will improve Description: Including pain rating scale, medication(s)/side effects and non-pharmacologic comfort measures Outcome: Progressing   Problem: Health Behavior/Discharge Planning: Goal: Ability to manage health-related needs will improve Outcome: Progressing   Problem: Activity: Goal: Risk for activity intolerance will decrease Outcome: Progressing   

## 2022-04-14 NOTE — Progress Notes (Signed)
Orthopedic Tech Progress Note Patient Details:  [name] 1951-03-13 [id number]  CPM Left Knee CPM Left Knee: On Left Knee Flexion (Degrees): 30 Left Knee Extension (Degrees): 0  Post Interventions Patient Tolerated: Fair Instructions Provided: Care of device  Karolee Stamps 04/14/2022, 7:42 PM

## 2022-04-14 NOTE — Congregational Nurse Program (Deleted)
PT recommendation for SNF noted. Pt admitted from [facility] ALF, has previous SNF stay at [facility]. [employer] auth request submitted for SNF (ref # [id number]) and appropriate clinicals faxed. SNF search started, will f/u with offers as available.   Wandra Feinstein, MSW, LCSW [phone] (coverage)

## 2022-04-14 NOTE — TOC Initial Note (Addendum)
Transition of Care [facility]) - Initial/Assessment Note    Patient Details  Name: [name] MRN: [id number] Date of Birth: 08-09-1951  Transition of Care [facility]) CM/SW Contact:    Victoria Chars, LCSW Phone Number: 04/14/2022, 1:27 PM  Clinical Narrative:   CSW spoke with pt regarding DC recommendation for SNF.  Pt agreeable, choice document given, permission given to send out referral in hub.  Pt would like [facility].  Permission given to speak with sister [name] and friend [name].  Pt lives alone.  No current services.      Pt was at [facility] earlier this year--checked with Sharyn Lull and pt has a balance due there and would need to take care of this before they would consider a readmission.             Referral sent out in hub.   1345: [facility] offers bed, CSW spoke with pt and she accepts.  Kelly/[facility] notified.  Will need auth submitted closer to DC date.  Expected Discharge Plan: Skilled Nursing Facility Barriers to Discharge: Continued Medical Work up, SNF Pending bed offer   Patient Goals and CMS Choice Patient states their goals for this hospitalization and ongoing recovery are:: be able to live alone CMS Medicare.gov Compare Post Acute Care list provided to:: Patient Choice offered to / list presented to : Patient  Expected Discharge Plan and Services Expected Discharge Plan: [place] In-house Referral: Clinical Social Work   Post Acute Care Choice: [place] Living arrangements for the past 2 months: [place]                                      Prior Living Arrangements/Services Living arrangements for the past 2 months: Single Family Home Lives with:: Self Patient language and need for interpreter reviewed:: Yes Do you feel safe going back to the place where you live?: Yes      Need for Family Participation in Patient Care: Yes (Comment) Care giver support system in place?: Yes  (comment) Current home services: Other (comment) (none) Criminal Activity/Legal Involvement Pertinent to Current Situation/Hospitalization: No - Comment as needed  Activities of Daily Living Home Assistive Devices/Equipment: Wheelchair ADL Screening (condition at time of admission) Patient's cognitive ability adequate to safely complete daily activities?: Yes Is the patient deaf or have difficulty Castillo?: No Does the patient have difficulty seeing, even when wearing glasses/contacts?: No Does the patient have difficulty concentrating, remembering, or making decisions?: No Patient able to express need for assistance with ADLs?: Yes Does the patient have difficulty dressing or bathing?: Yes Independently performs ADLs?: No Communication: Appropriate for developmental age Dressing (OT): Needs assistance Is this a change from baseline?: Change from baseline, expected to last >3 days Grooming: Needs assistance Is this a change from baseline?: Change from baseline, expected to last <3 days Feeding: Needs assistance Is this a change from baseline?: Change from baseline, expected to last <3 days Bathing: Needs assistance Is this a change from baseline?: Change from baseline, expected to last <3 days Toileting: Needs assistance Is this a change from baseline?: Change from baseline, expected to last <3 days In/Out Bed: Needs assistance Is this a change from baseline?: Change from baseline, expected to last <3 days Walks in Home: Needs assistance Is this a change from baseline?: Change from baseline, expected to last <3 days Does the patient have difficulty walking or climbing  stairs?: Yes Weakness of Legs: Both Weakness of Arms/Hands: Both  Permission Sought/Granted Permission sought to share information with : Family Supports Permission granted to share information with : Yes, Verbal Permission Granted  Share Information with NAME: sister [name], friend [name]  Permission granted  to share info w AGENCY: SNF        Emotional Assessment Appearance:: Appears stated age Attitude/Demeanor/Rapport: Engaged Affect (typically observed): Appropriate Orientation: : Oriented to Self, Oriented to Place, Oriented to  Time, Oriented to Situation Alcohol / Substance Use: Not Applicable Psych Involvement: No (comment)  Admission diagnosis:  DJD (degenerative joint disease) of knee [M17.9] S/P TKR (total knee replacement), left [Z96.652] Patient Active Problem List   Diagnosis Date Noted   DJD (degenerative joint disease) of knee 04/13/2022   S/P TKR (total knee replacement), left 04/13/2022   Arthritis of right knee    S/P total knee arthroplasty, right 12/08/2021   Pressure injury of skin 08/27/2021   Fall 08/23/2021   Difficulty in walking 08/23/2021   Bilateral lower extremity edema 08/23/2021   Hormone replacement therapy 08/23/2021   Osteoporosis 01/28/2021   Osteoarthritis of right wrist 01/28/2021   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   Foot pain 09/13/2020   Knee pain 08/24/2018   Fall in home 07/14/2015   TIBIALIS TENDINITIS 08/25/2009   CHEST PAIN, OTHER, PAIN 10/21/2008   PEDAL EDEMA 07/22/2008   ANKLE PAIN, BILATERAL 04/01/2008   LOW BACK PAIN 04/01/2008   ACUTE BRONCHITIS 09/23/2007   SARCOIDOSIS 09/02/2007   Asthma 09/02/2007   Diverticulosis of colon 07/11/2007   BACK PAIN, CHRONIC 07/11/2007   PCP:  Victoria Ruddy, MD Pharmacy:   [id number] [place] [id number]: [phone]: [phone]    Social Determinants of Health (SDOH) Interventions    Readmission Risk Interventions     No data to display

## 2022-04-15 DIAGNOSIS — Z6836 Body mass index (BMI) 36.0-36.9, adult: Secondary | ICD-10-CM | POA: Diagnosis not present

## 2022-04-15 DIAGNOSIS — Z886 Allergy status to analgesic agent status: Secondary | ICD-10-CM | POA: Diagnosis not present

## 2022-04-15 DIAGNOSIS — M179 Osteoarthritis of knee, unspecified: Secondary | ICD-10-CM | POA: Diagnosis present

## 2022-04-15 DIAGNOSIS — H409 Unspecified glaucoma: Secondary | ICD-10-CM | POA: Diagnosis present

## 2022-04-15 DIAGNOSIS — D638 Anemia in other chronic diseases classified elsewhere: Secondary | ICD-10-CM | POA: Diagnosis present

## 2022-04-15 DIAGNOSIS — M1712 Unilateral primary osteoarthritis, left knee: Secondary | ICD-10-CM | POA: Diagnosis present

## 2022-04-15 DIAGNOSIS — J45909 Unspecified asthma, uncomplicated: Secondary | ICD-10-CM | POA: Diagnosis present

## 2022-04-15 DIAGNOSIS — E669 Obesity, unspecified: Secondary | ICD-10-CM | POA: Diagnosis present

## 2022-04-15 DIAGNOSIS — M810 Age-related osteoporosis without current pathological fracture: Secondary | ICD-10-CM | POA: Diagnosis present

## 2022-04-15 DIAGNOSIS — K219 Gastro-esophageal reflux disease without esophagitis: Secondary | ICD-10-CM | POA: Diagnosis present

## 2022-04-15 DIAGNOSIS — Z96651 Presence of right artificial knee joint: Secondary | ICD-10-CM | POA: Diagnosis present

## 2022-04-15 DIAGNOSIS — Z882 Allergy status to sulfonamides status: Secondary | ICD-10-CM | POA: Diagnosis not present

## 2022-04-15 DIAGNOSIS — D869 Sarcoidosis, unspecified: Secondary | ICD-10-CM | POA: Diagnosis present

## 2022-04-15 LAB — CBC
HCT: 24.7 % — ABNORMAL LOW (ref 36.0–46.0)
Hemoglobin: 7.7 g/dL — ABNORMAL LOW (ref 12.0–15.0)
MCH: 26.3 pg (ref 26.0–34.0)
MCHC: 31.2 g/dL (ref 30.0–36.0)
MCV: 84.3 fL (ref 80.0–100.0)
Platelets: 659 10*3/uL — ABNORMAL HIGH (ref 150–400)
RBC: 2.93 MIL/uL — ABNORMAL LOW (ref 3.87–5.11)
RDW: 18.8 % — ABNORMAL HIGH (ref 11.5–15.5)
WBC: 8.4 10*3/uL (ref 4.0–10.5)
nRBC: 0 % (ref 0.0–0.2)

## 2022-04-15 LAB — URINE CULTURE: Culture: >=100000 — AB

## 2022-04-15 MED ORDER — LABETALOL HCL 5 MG/ML IV SOLN
10.0000 mg | Freq: Once | INTRAVENOUS | Status: AC
  Administered 2022-04-15: 10 mg via INTRAVENOUS
  Filled 2022-04-15: qty 4

## 2022-04-15 MED ORDER — OXYCODONE HCL 5 MG PO TABS
5.0000 mg | ORAL_TABLET | ORAL | Status: DC
  Administered 2022-04-15: 5 mg via ORAL
  Administered 2022-04-15 – 2022-04-16 (×4): 10 mg via ORAL
  Administered 2022-04-16: 5 mg via ORAL
  Administered 2022-04-17 (×3): 10 mg via ORAL
  Administered 2022-04-17: 5 mg via ORAL
  Administered 2022-04-17 (×2): 10 mg via ORAL
  Filled 2022-04-15 (×4): qty 2
  Filled 2022-04-15 (×2): qty 1
  Filled 2022-04-15 (×6): qty 2

## 2022-04-15 NOTE — Progress Notes (Signed)
Physical Therapy Treatment Patient Details Name: [name] MRN: [id number] DOB: March 28, 1951 Today's Date: 04/15/2022   History of Present Illness 71 y.o. female s/p Left TKA 04/13/22. Hx of RTKA 12/08/21. PMH: Sarcoidosis, osteoporosis, falls, chronic backpain.    PT Comments    Pt with poor tolerance to session 2/2 pain, confusion, and weakness. Deferred transfer to chair. Pt performed therapeutic exercises seated at EOB and attempted two sit to stands with max assist. Pt moaning in pain after session complete when back in bed and ice applied with RN present to provide her pain meds. Continue as tolerated.   Recommendations for follow up therapy are one component of a multi-disciplinary discharge planning process, led by the attending physician.  Recommendations may be updated based on patient status, additional functional criteria and insurance authorization.  Follow Up Recommendations  Skilled nursing-short term rehab (<3 hours/day) ((per physician recs as well.)) Can patient physically be transported by private vehicle: No   Assistance Recommended at Discharge Frequent or constant Supervision/Assistance  Patient can return home with the following     Equipment Recommendations  None recommended by PT    Recommendations for Other Services OT consult (Very weak grip bil)     Precautions / Restrictions Precautions Precautions: Knee;Fall Precaution Booklet Issued: Yes (comment) Precaution Comments: reviewed Required Braces or Orthoses: Other Brace Other Brace: Bone foam, KI in room, CPM machine 10-40 deg post op increase 10 deg ea day. Restrictions Weight Bearing Restrictions: No LLE Weight Bearing: Weight bearing as tolerated     Mobility  Bed Mobility Overal bed mobility: Needs Assistance Bed Mobility: Supine to Sit, Sit to Supine     Supine to sit: Mod assist, HOB elevated, Max assist Sit to supine: Max assist   General bed mobility comments: Pt required moderate  assistance mostly for supine to sit with use of HHA and bed rail. However, pt required maximal assistance for scooting towards EOB as she provided poor effort stating that it was not a good idea to use her hands 2/2 carpal tunnel. Pt also required total assistance x 2 following getting back to bed to get in appropriate bed position (higher up with pad).    Transfers Overall transfer level: Needs assistance Equipment used: Rolling walker (2 wheels) Transfers: Sit to/from Stand Sit to Stand: From elevated surface, Max assist           General transfer comment: Attempted two sit <> stands from elevated bed surface using rocking momentum strategy. Pt able to perform quarter stand and 3/4 stand respectively with rest between trials. Pt limited by pain, weakness, and fatigue.    Ambulation/Gait               General Gait Details: unable   Stairs             Wheelchair Mobility    Modified Rankin (Stroke Patients Only)       Balance Overall balance assessment: Needs assistance Sitting-balance support: No upper extremity supported, Feet supported Sitting balance-Leahy Scale: Fair     Standing balance support: Bilateral upper extremity supported, Reliant on assistive device for balance Standing balance-Leahy Scale: Poor                              Cognition Arousal/Alertness: Awake/alert Behavior During Therapy: WFL for tasks assessed/performed Overall Cognitive Status: Impaired/Different from baseline Area of Impairment: Problem solving, Awareness, Memory  Memory: Decreased short-term memory     Awareness: Emergent, Anticipatory Problem Solving: Slow processing, Decreased initiation, Requires verbal cues General Comments: Pt A and O x 4 but confused. Pt unable to count her exercise reps accurately.        Exercises Total Joint Exercises Ankle Circles/Pumps: Both, 10 reps, Seated (heel/toe raises) Heel Slides:  Seated, AROM, Left, 10 reps (at EOB) Hip ABduction/ADduction: Seated, 10 reps, Both (hip adduction squeezes) General Exercises - Lower Extremity Hip Flexion/Marching: Left, 5 reps, Seated (minimal ROM)    General Comments        Pertinent Vitals/Pain Pain Assessment Pain Assessment: 0-10 Pain Score: 7  Pain Location: Lt knee Pain Descriptors / Indicators: Aching, Grimacing, Guarding Pain Intervention(s): Limited activity within patient's tolerance, Monitored during session, Patient requesting pain meds-RN notified, Ice applied    Home Living                          Prior Function            PT Goals (current goals can now be found in the care plan section) Acute Rehab PT Goals Patient Stated Goal: Get well, live independently again PT Goal Formulation: With patient Time For Goal Achievement: 04/21/22 Potential to Achieve Goals:  (guarded) Progress towards PT goals: Progressing toward goals (slow progress)    Frequency    7X/week      PT Plan Current plan remains appropriate    Co-evaluation              AM-PAC PT "6 Clicks" Mobility   Outcome Measure  Help needed turning from your back to your side while in a flat bed without using bedrails?: A Lot Help needed moving from lying on your back to sitting on the side of a flat bed without using bedrails?: A Lot Help needed moving to and from a bed to a chair (including a wheelchair)?: Total Help needed standing up from a chair using your arms (e.g., wheelchair or bedside chair)?: Total Help needed to walk in hospital room?: Total Help needed climbing 3-5 steps with a railing? : Total 6 Click Score: 8    End of Session Equipment Utilized During Treatment: Gait belt Activity Tolerance: Patient limited by pain;Other (comment) (poor tolerance) Patient left: with call bell/phone within reach;with nursing/sitter in room;in bed;with bed alarm set;with SCD's reapplied Nurse Communication: Mobility  status;Patient requests pain meds PT Visit Diagnosis: Unsteadiness on feet (R26.81);Muscle weakness (generalized) (M62.81);History of falling (Z91.81);Difficulty in walking, not elsewhere classified (R26.2);Pain Pain - Right/Left: Left Pain - part of body: Knee     Time: 0900-0930 PT Time Calculation (min) (ACUTE ONLY): 30 min  Charges:  $Therapeutic Exercise: 8-22 mins $Therapeutic Activity: 8-22 mins                    [name], PT    [facility] 04/15/2022, 9:46 AM

## 2022-04-15 NOTE — Progress Notes (Signed)
  Subjective: Patient stable.  Having expected amount of pain for a very difficult primary knee replacement.  Incisional VAC in position.   Objective: Vital signs in last 24 hours: Temp:  [97.7 F (36.5 C)-98.4 F (36.9 C)] 97.9 F (36.6 C) (07/15 0551) Pulse Rate:  [94-113] 103 (07/15 0551) Resp:  [16-18] 16 (07/15 0551) BP: (117-151)/(72-98) 151/81 (07/15 0551) SpO2:  [94 %-100 %] 95 % (07/15 0551)  Intake/Output from previous day: 07/14 0701 - 07/15 0700 In: -  Out: 201 [Urine:200; Stool:1] Intake/Output this shift: No intake/output data recorded.  Exam:  Dorsiflexion/Plantar flexion intact  Labs: Recent Labs    04/14/22 0307 04/15/22 0206  HGB 7.8* 7.7*   Recent Labs    04/14/22 0307 04/15/22 0206  WBC 11.4* 8.4  RBC 3.03* 2.93*  HCT 25.9* 24.7*  PLT 640* 659*   No results for input(s): "NA", "K", "CL", "CO2", "BUN", "CREATININE", "GLUCOSE", "CALCIUM" in the last 72 hours. No results for input(s): "LABPT", "INR" in the last 72 hours.  Assessment/Plan: Plan at this time is CPM machine this morning along with physical therapy.  Plan for discharge to skilled nursing facility Monday versus Tuesday.  Also plan to change her from as needed to scheduled pain medication.  Appreciate [place] placing her in CPM machine  this morning   G Alphonzo Severance 04/15/2022, 7:45 AM

## 2022-04-15 NOTE — Progress Notes (Signed)
Orthopedic Tech Progress Note Patient Details:  [name] [birth year] [id number]  CPM Left Knee CPM Left Knee: On Left Knee Flexion (Degrees): 30 Left Knee Extension (Degrees): 10  Post Interventions Patient Tolerated: Well Instructions Provided: Care of device  [facility] Kaidyn Hernandes 04/15/2022, 8:37 AM

## 2022-04-15 NOTE — Progress Notes (Signed)
Pt's HR was stable at 120's, asymptomatic. PA was informed and ordered Labetalol '10mg'$  IV. HR is currently running between 90's to 105.  04/15/22 1100  Assess: MEWS Score  BP 139/80  MAP (mmHg) 95  Pulse Rate (!) 123  Resp 18  SpO2 100 %  O2 Device Room Air  Assess: MEWS Score  MEWS Temp 0  MEWS Systolic 0  MEWS Pulse 2  MEWS RR 0  MEWS LOC 0  MEWS Score 2  MEWS Score Color Yellow  Assess: if the MEWS score is Yellow or Red  Were vital signs taken at a resting state? Yes  Focused Assessment No change from prior assessment  Does the patient meet 2 or more of the SIRS criteria? No  MEWS guidelines implemented *See Row Information* Yes  Escalate  MEWS: Escalate Yellow: discuss with charge nurse/RN and consider discussing with provider and RRT  Notify: Charge Nurse/RN  Name of Charge Nurse/RN Notified Rodena Piety CN  Date Charge Nurse/RN Notified 04/15/22  Time Charge Nurse/RN Notified 7  Notify: Provider  Provider Name/Title Tiney Rouge PA  Date Provider Notified 04/15/22  Time Provider Notified 1135  Method of Notification Call  Notification Reason Other (Comment) (Yellow MEWS)  Provider response See new orders  Date of Provider Response 04/15/22  Time of Provider Response 1137  Assess: SIRS CRITERIA  SIRS Temperature  0  SIRS Pulse 1  SIRS Respirations  0  SIRS WBC 0  SIRS Score Sum  1

## 2022-04-15 NOTE — Plan of Care (Signed)

## 2022-04-16 LAB — HEMOGLOBIN AND HEMATOCRIT, BLOOD
HCT: 29.5 % — ABNORMAL LOW (ref 36.0–46.0)
Hemoglobin: 9.3 g/dL — ABNORMAL LOW (ref 12.0–15.0)

## 2022-04-16 LAB — PREPARE RBC (CROSSMATCH)

## 2022-04-16 LAB — ABO/RH: ABO/RH(D): O POS

## 2022-04-16 MED ORDER — SODIUM CHLORIDE 0.9% IV SOLUTION
Freq: Once | INTRAVENOUS | Status: AC
Start: 2022-04-16 — End: 2022-04-16

## 2022-04-16 MED ORDER — FUROSEMIDE 10 MG/ML IJ SOLN
20.0000 mg | Freq: Once | INTRAMUSCULAR | Status: AC
  Administered 2022-04-16: 20 mg via INTRAVENOUS
  Filled 2022-04-16: qty 2

## 2022-04-16 NOTE — Plan of Care (Signed)

## 2022-04-16 NOTE — Progress Notes (Addendum)
PT Cancellation Note  Patient Details Name: [name] MRN: [id number] DOB: 02-15-1951   Cancelled Treatment:    Reason Eval/Treat Not Completed: Patient declined, no reason specified. Pt sitting at EOB upon arrival with bed alarm on. Pt confused. Pt stating she needs to go to bathroom but refuses to transfer to [facility]. Pt also refuses getting to chair. UPDATE: pt attempted to be seen again in afternoon but unwilling until she gets unit of blood.    [name] 04/16/2022, 9:04 AM

## 2022-04-16 NOTE — Progress Notes (Addendum)
Subjective: 3 Days Post-Op Procedure(s) (LRB): LEFT TOTAL KNEE ARTHROPLASTY (Left) Patient reports pain as mild.  Has been slightly tachycardic and hypotensive.  Hx of anemia.  Currently fairly tired  Objective: Vital signs in last 24 hours: Temp:  [98.6 F (37 C)-99.4 F (37.4 C)] 99.4 F (37.4 C) (07/16 0729) Pulse Rate:  [98-123] 110 (07/16 0729) Resp:  [18] 18 (07/15 1100) BP: (114-139)/(54-80) 114/57 (07/16 0729) SpO2:  [97 %-100 %] 98 % (07/16 0729)  Intake/Output from previous day: 07/15 0701 - 07/16 0700 In: 550 [P.O.:550] Out: 702 [Urine:701; Stool:1] Intake/Output this shift: No intake/output data recorded.  Recent Labs    04/14/22 0307 04/15/22 0206  HGB 7.8* 7.7*   Recent Labs    04/14/22 0307 04/15/22 0206  WBC 11.4* 8.4  RBC 3.03* 2.93*  HCT 25.9* 24.7*  PLT 640* 659*   No results for input(s): "NA", "K", "CL", "CO2", "BUN", "CREATININE", "GLUCOSE", "CALCIUM" in the last 72 hours. No results for input(s): "LABPT", "INR" in the last 72 hours.  Neurologically intact Neurovascular intact Sensation intact distally Intact pulses distally Dorsiflexion/Plantar flexion intact Incision: dressing C/D/I No cellulitis present Compartment soft   Assessment/Plan: 3 Days Post-Op Procedure(s) (LRB): LEFT TOTAL KNEE ARTHROPLASTY (Left) Advance diet Up with therapy D/C IV fluids Discharge to SNF Monday vs tuesday WBAT LLE Continue with wound vac Acute on chronic anemia- will order 1 unit prbc as she is symptomatic   Anticipated LOS equal to or greater than 2 midnights due to - Age 55 and older with one or more of the following:  - Obesity  - Expected need for hospital services (PT, OT, Nursing) required for safe  discharge  - Anticipated need for postoperative skilled nursing care or inpatient rehab  - Active co-morbidities: None OR   - Unanticipated findings during/Post Surgery: Slow post-op progression: GI, pain control, mobility  - Patient is a  high risk of re-admission due to: Barriers to post-acute care (logistical, no family support in home)   Aundra Dubin 04/16/2022, 8:38 AM

## 2022-04-17 ENCOUNTER — Encounter (HOSPITAL_COMMUNITY): Payer: Self-pay | Admitting: Orthopedic Surgery

## 2022-04-17 DIAGNOSIS — M6281 Muscle weakness (generalized): Secondary | ICD-10-CM | POA: Diagnosis not present

## 2022-04-17 DIAGNOSIS — K5900 Constipation, unspecified: Secondary | ICD-10-CM | POA: Diagnosis not present

## 2022-04-17 DIAGNOSIS — M069 Rheumatoid arthritis, unspecified: Secondary | ICD-10-CM | POA: Diagnosis not present

## 2022-04-17 DIAGNOSIS — Z743 Need for continuous supervision: Secondary | ICD-10-CM | POA: Diagnosis not present

## 2022-04-17 DIAGNOSIS — R222 Localized swelling, mass and lump, trunk: Secondary | ICD-10-CM | POA: Diagnosis not present

## 2022-04-17 DIAGNOSIS — D75839 Thrombocytosis, unspecified: Secondary | ICD-10-CM | POA: Diagnosis not present

## 2022-04-17 DIAGNOSIS — M545 Low back pain, unspecified: Secondary | ICD-10-CM | POA: Diagnosis not present

## 2022-04-17 DIAGNOSIS — R278 Other lack of coordination: Secondary | ICD-10-CM | POA: Diagnosis not present

## 2022-04-17 DIAGNOSIS — W19XXXA Unspecified fall, initial encounter: Secondary | ICD-10-CM | POA: Diagnosis not present

## 2022-04-17 DIAGNOSIS — M79605 Pain in left leg: Secondary | ICD-10-CM | POA: Diagnosis not present

## 2022-04-17 DIAGNOSIS — F331 Major depressive disorder, recurrent, moderate: Secondary | ICD-10-CM | POA: Diagnosis not present

## 2022-04-17 DIAGNOSIS — Z79899 Other long term (current) drug therapy: Secondary | ICD-10-CM | POA: Diagnosis not present

## 2022-04-17 DIAGNOSIS — N181 Chronic kidney disease, stage 1: Secondary | ICD-10-CM | POA: Diagnosis not present

## 2022-04-17 DIAGNOSIS — R2242 Localized swelling, mass and lump, left lower limb: Secondary | ICD-10-CM | POA: Diagnosis not present

## 2022-04-17 DIAGNOSIS — R52 Pain, unspecified: Secondary | ICD-10-CM | POA: Diagnosis not present

## 2022-04-17 DIAGNOSIS — M7989 Other specified soft tissue disorders: Secondary | ICD-10-CM | POA: Diagnosis not present

## 2022-04-17 DIAGNOSIS — R262 Difficulty in walking, not elsewhere classified: Secondary | ICD-10-CM | POA: Diagnosis not present

## 2022-04-17 DIAGNOSIS — Z96651 Presence of right artificial knee joint: Secondary | ICD-10-CM | POA: Diagnosis not present

## 2022-04-17 DIAGNOSIS — Z471 Aftercare following joint replacement surgery: Secondary | ICD-10-CM | POA: Diagnosis not present

## 2022-04-17 DIAGNOSIS — L8990 Pressure ulcer of unspecified site, unspecified stage: Secondary | ICD-10-CM | POA: Diagnosis not present

## 2022-04-17 DIAGNOSIS — I10 Essential (primary) hypertension: Secondary | ICD-10-CM | POA: Diagnosis not present

## 2022-04-17 DIAGNOSIS — Z96652 Presence of left artificial knee joint: Secondary | ICD-10-CM | POA: Diagnosis not present

## 2022-04-17 DIAGNOSIS — R112 Nausea with vomiting, unspecified: Secondary | ICD-10-CM | POA: Diagnosis not present

## 2022-04-17 DIAGNOSIS — R2689 Other abnormalities of gait and mobility: Secondary | ICD-10-CM | POA: Diagnosis not present

## 2022-04-17 DIAGNOSIS — M1711 Unilateral primary osteoarthritis, right knee: Secondary | ICD-10-CM | POA: Diagnosis not present

## 2022-04-17 DIAGNOSIS — R279 Unspecified lack of coordination: Secondary | ICD-10-CM | POA: Diagnosis not present

## 2022-04-17 DIAGNOSIS — M179 Osteoarthritis of knee, unspecified: Secondary | ICD-10-CM | POA: Diagnosis not present

## 2022-04-17 DIAGNOSIS — D649 Anemia, unspecified: Secondary | ICD-10-CM | POA: Diagnosis not present

## 2022-04-17 DIAGNOSIS — R600 Localized edema: Secondary | ICD-10-CM | POA: Diagnosis not present

## 2022-04-17 DIAGNOSIS — Z7689 Persons encountering health services in other specified circumstances: Secondary | ICD-10-CM | POA: Diagnosis not present

## 2022-04-17 DIAGNOSIS — R109 Unspecified abdominal pain: Secondary | ICD-10-CM | POA: Diagnosis not present

## 2022-04-17 DIAGNOSIS — M79672 Pain in left foot: Secondary | ICD-10-CM | POA: Diagnosis not present

## 2022-04-17 DIAGNOSIS — L03116 Cellulitis of left lower limb: Secondary | ICD-10-CM | POA: Diagnosis not present

## 2022-04-17 LAB — BPAM RBC
Blood Product Expiration Date: 202308202359
ISSUE DATE / TIME: 202307161451
Unit Type and Rh: 5100

## 2022-04-17 LAB — TYPE AND SCREEN
ABO/RH(D): O POS
Antibody Screen: NEGATIVE
Unit division: 0

## 2022-04-17 MED ORDER — RIVAROXABAN 10 MG PO TABS: 10.0000 mg | ORAL_TABLET | Freq: Every day | ORAL | 0 refills | Status: DC

## 2022-04-17 MED ORDER — ALBUTEROL SULFATE (2.5 MG/3ML) 0.083% IN NEBU
2.5000 mg | INHALATION_SOLUTION | Freq: Four times a day (QID) | RESPIRATORY_TRACT | Status: DC
  Administered 2022-04-17: 2.5 mg via RESPIRATORY_TRACT
  Filled 2022-04-17: qty 3

## 2022-04-17 MED ORDER — ALBUTEROL SULFATE (2.5 MG/3ML) 0.083% IN NEBU
2.5000 mg | INHALATION_SOLUTION | Freq: Four times a day (QID) | RESPIRATORY_TRACT | Status: DC | PRN
Start: 2022-04-17 — End: 2022-04-18

## 2022-04-17 MED ORDER — ALBUTEROL SULFATE HFA 108 (90 BASE) MCG/ACT IN AERS: 1.0000 | INHALATION_SPRAY | Freq: Four times a day (QID) | RESPIRATORY_TRACT | Status: DC

## 2022-04-17 MED ORDER — RIVAROXABAN 10 MG PO TABS
10.0000 mg | ORAL_TABLET | Freq: Every day | ORAL | Status: DC
  Administered 2022-04-17: 10 mg via ORAL
  Filled 2022-04-17: qty 1

## 2022-04-17 NOTE — Progress Notes (Signed)
Physical Therapy Treatment Patient Details Name: [name] MRN: [id number] DOB: 1951/07/25 Today's Date: 04/17/2022   History of Present Illness 71 y.o. female s/p Left TKA 04/13/22. Hx of RTKA 12/08/21. PMH: Sarcoidosis, osteoporosis, falls, chronic backpain.    PT Comments    Pt with fair tolerance to treatment. Pt performed transfer from bed to chair via Stedy with moderate assistance provided throughout. Progress as tolerated.   Recommendations for follow up therapy are one component of a multi-disciplinary discharge planning process, led by the attending physician.  Recommendations may be updated based on patient status, additional functional criteria and insurance authorization.  Follow Up Recommendations  Skilled nursing-short term rehab (<3 hours/day) ((per physician recs as well.)) Can patient physically be transported by private vehicle: No   Assistance Recommended at Discharge Frequent or constant Supervision/Assistance  Patient can return home with the following     Equipment Recommendations  None recommended by PT    Recommendations for Other Services OT consult (Very weak grip bil)     Precautions / Restrictions Precautions Precautions: Knee;Fall Precaution Booklet Issued: Yes (comment) Precaution Comments: reviewed Required Braces or Orthoses: Other Brace Other Brace: Bone foam, KI in room, CPM machine 10-40 deg post op increase 10 deg ea day. Restrictions Weight Bearing Restrictions: No LLE Weight Bearing: Weight bearing as tolerated     Mobility  Bed Mobility Overal bed mobility: Needs Assistance Bed Mobility: Supine to Sit     Supine to sit: Mod assist, HOB elevated, Max assist     General bed mobility comments: Pt required moderate assistance for supine to sit with use of HHA and bed rail. Pt required increased time to complete.    Transfers Overall transfer level: Needs assistance Equipment used:  Charlaine Dalton) Transfers: Sit to/from Stand Sit  to Stand: From elevated surface, Mod assist           General transfer comment: Pt with decreased hip and knee extension during sit to stand with Stedy. Pt required increased cues for hand placements. Increased pain present during transfer. Transfer via Lift Equipment: Stedy  Ambulation/Gait               General Gait Details: unable   Marine scientist Rankin (Stroke Patients Only)       Balance Overall balance assessment: Needs assistance Sitting-balance support: No upper extremity supported, Feet supported Sitting balance-Leahy Scale: Fair     Standing balance support: Bilateral upper extremity supported, Reliant on assistive device for balance Standing balance-Leahy Scale: Poor                              Cognition Arousal/Alertness: Awake/alert Behavior During Therapy: WFL for tasks assessed/performed Overall Cognitive Status: Impaired/Different from baseline Area of Impairment: Problem solving, Awareness, Memory                     Memory: Decreased short-term memory     Awareness: Emergent, Anticipatory Problem Solving: Slow processing, Decreased initiation, Requires verbal cues General Comments: Pt initially thought it was 2053 but able to correct herself. Pt with poor ability to stay on task.        Exercises      General Comments        Pertinent Vitals/Pain Pain Assessment Pain Assessment: 0-10 Pain Score: 2  Pain Location: Lt knee Pain Descriptors / Indicators:  Aching, Grimacing, Guarding Pain Intervention(s): Limited activity within patient's tolerance, Monitored during session, Patient requesting pain meds-RN notified    Home Living                          Prior Function            PT Goals (current goals can now be found in the care plan section) Acute Rehab PT Goals Patient Stated Goal: Get well, live independently again PT Goal Formulation: With  patient Time For Goal Achievement: 04/21/22 Potential to Achieve Goals: Fair Progress towards PT goals: Progressing toward goals (slow and limited)    Frequency    7X/week      PT Plan Current plan remains appropriate    Co-evaluation              AM-PAC PT "6 Clicks" Mobility   Outcome Measure  Help needed turning from your back to your side while in a flat bed without using bedrails?: A Lot Help needed moving from lying on your back to sitting on the side of a flat bed without using bedrails?: A Lot Help needed moving to and from a bed to a chair (including a wheelchair)?: Total Help needed standing up from a chair using your arms (e.g., wheelchair or bedside chair)?: Total Help needed to walk in hospital room?: Total Help needed climbing 3-5 steps with a railing? : Total 6 Click Score: 8    End of Session Equipment Utilized During Treatment: Gait belt Activity Tolerance: Patient limited by pain;Patient limited by fatigue (poor tolerance) Patient left: in chair;with call bell/phone within reach;with chair alarm set Nurse Communication: Mobility status;Patient requests pain meds PT Visit Diagnosis: Unsteadiness on feet (R26.81);Muscle weakness (generalized) (M62.81);History of falling (Z91.81);Difficulty in walking, not elsewhere classified (R26.2);Pain Pain - Right/Left: Left Pain - part of body: Knee     Time: 5035-4656 PT Time Calculation (min) (ACUTE ONLY): 16 min  Charges:  $Therapeutic Activity: 8-22 mins                    [name], PT    [facility] 04/17/2022, 12:54 PM

## 2022-04-17 NOTE — Progress Notes (Signed)
Called [facility],ask if they would accept pt this late at [facility] said 'yes"; PTAR in process to transfer pt.

## 2022-04-17 NOTE — Discharge Summary (Signed)
Discharge Diagnoses:  Principal Problem:   DJD (degenerative joint disease) of knee Active Problems:   S/P TKR (total knee replacement), left   Surgeries: Procedure(s): LEFT TOTAL KNEE ARTHROPLASTY on 04/13/2022    Consultants:   Discharged Condition: Improved  Hospital Course: [name] is an 71 y.o. female who was admitted 04/13/2022 with a chief complaint of left knee pain, with a final diagnosis of left knee osteoarthritis.  Patient was brought to the operating room on 04/13/2022 and underwent Procedure(s): LEFT TOTAL KNEE ARTHROPLASTY.    Patient was given perioperative antibiotics:  Anti-infectives (From admission, onward)    Start     Dose/Rate Route Frequency Ordered Stop   04/14/22 0600  ceFAZolin (ANCEF) IVPB 2g/100 mL premix        2 g 200 mL/hr over 30 Minutes Intravenous On call to O.R. 04/13/22 1325 04/13/22 1605   04/13/22 1703  vancomycin (VANCOCIN) powder  Status:  Discontinued          As needed 04/13/22 1703 04/13/22 1918   04/13/22 0000  ceFAZolin (ANCEF) IVPB 2g/100 mL premix        2 g 200 mL/hr over 30 Minutes Intravenous Every 8 hours 04/13/22 2117 04/14/22 2359     .  Patient was given sequential compression devices, early ambulation, and aspirin for DVT prophylaxis.  Recent vital signs: Patient Vitals for the past 24 hrs:  BP Temp Temp src Pulse Resp SpO2  04/17/22 1419 121/68 98 F (36.7 C) Oral 100 19 100 %  04/17/22 0824 117/76 97.8 F (36.6 C) Oral 100 18 98 %  04/17/22 0442 123/82 97.7 F (36.5 C) Oral 99 16 100 %  04/16/22 2011 119/66 98.3 F (36.8 C) Oral (!) 106 16 100 %  04/16/22 2011 119/66 98.3 F (36.8 C) Oral (!) 106 16 100 %  .  Recent laboratory studies: No results found.  Discharge Medications:   Allergies as of 04/17/2022       Reactions   Ibuprofen Rash   Sulfa Antibiotics Rash        Medication List     STOP taking these medications    aspirin 81 MG chewable tablet   estradiol 1 MG tablet Commonly  known as: ESTRACE       TAKE these medications    acetaminophen 500 MG tablet Commonly known as: TYLENOL Take 1,000 mg by mouth in the morning and at bedtime.   albuterol 108 (90 Base) MCG/ACT inhaler Commonly known as: VENTOLIN HFA TAKE 2 PUFFS BY MOUTH EVERY 6 HOURS AS NEEDED   Biofreeze 4 % Gel Generic drug: Menthol (Topical Analgesic) Apply 1 Application topically in the morning, at noon, and at bedtime. Apply to left knee and both wrist   cyclobenzaprine 5 MG tablet Commonly known as: FLEXERIL Take 5 mg by mouth every 8 (eight) hours as needed for muscle spasms.   cycloSPORINE 0.05 % ophthalmic emulsion Commonly known as: RESTASIS Place 1 drop into both eyes 2 (two) times daily.   docusate sodium 100 MG capsule Commonly known as: COLACE Take 100 mg by mouth daily.   DULoxetine 30 MG capsule Commonly known as: CYMBALTA Take 30 mg by mouth daily.   fluticasone 50 MCG/ACT nasal spray Commonly known as: FLONASE Place 1 spray into both nostrils daily.   gabapentin 300 MG capsule Commonly known as: NEURONTIN Take 300 mg by mouth 2 (two) times daily.   guaiFENesin-dextromethorphan 100-10 MG/5ML syrup Commonly known as: ROBITUSSIN DM Take 10 mLs by mouth  every 6 (six) hours as needed for cough.   levocetirizine 5 MG tablet Commonly known as: Xyzal Allergy 24HR Take 1 tablet (5 mg total) by mouth every evening. What changed: when to take this   omeprazole 20 MG capsule Commonly known as: PRILOSEC Take 20 mg by mouth in the morning and at bedtime.   ondansetron 8 MG disintegrating tablet Commonly known as: ZOFRAN-ODT Take 8 mg by mouth 2 (two) times daily as needed for vomiting or nausea.   oxyCODONE 20 mg 12 hr tablet Commonly known as: OXYCONTIN Take 20 mg by mouth daily as needed (osteoarthritis).   Oxycodone HCl 10 MG Tabs Take 1 tablet (10 mg total) by mouth every 4 (four) hours as needed for severe pain.   polyethylene glycol powder 17 GM/SCOOP  powder Commonly known as: GLYCOLAX/MIRALAX Take 17 g by mouth daily as needed. What changed: reasons to take this   psyllium 0.52 g capsule Commonly known as: REGULOID Take 0.52 g by mouth at bedtime.   rivaroxaban 10 MG Tabs tablet Commonly known as: XARELTO Take 1 tablet (10 mg total) by mouth daily.   sennosides-docusate sodium 8.6-50 MG tablet Commonly known as: SENOKOT-S Take 1 tablet by mouth every Monday, Wednesday, and Friday at 8 PM.   Vitamin D (Ergocalciferol) 1.25 MG (50000 UNIT) Caps capsule Commonly known as: DRISDOL Take 1 capsule (50,000 Units total) by mouth every 7 (seven) days. What changed: when to take this   Wixela Inhub 250-50 MCG/ACT Aepb Generic drug: fluticasone-salmeterol INHALE 1 PUFF BY MOUTH TWICE A DAY        Diagnostic Studies: No results found.  Patient benefited maximally from their hospital stay and there were no complications.     Disposition: Discharge disposition: 01-Home or Self Care      Discharge Instructions     Call MD / Call 911   Complete by: As directed    If you experience chest pain or shortness of breath, CALL 911 and be transported to the hospital emergency room.  If you develope a fever above 101 F, pus (white drainage) or increased drainage or redness at the wound, or calf pain, call your surgeon's office.   Call MD / Call 911   Complete by: As directed    If you experience chest pain or shortness of breath, CALL 911 and be transported to the hospital emergency room.  If you develope a fever above 101 F, pus (white drainage) or increased drainage or redness at the wound, or calf pain, call your surgeon's office.   Constipation Prevention   Complete by: As directed    Drink plenty of fluids.  Prune juice may be helpful.  You may use a stool softener, such as Colace (over the counter) 100 mg twice a day.  Use MiraLax (over the counter) for constipation as needed.   Constipation Prevention   Complete by: As  directed    Drink plenty of fluids.  Prune juice may be helpful.  You may use a stool softener, such as Colace (over the counter) 100 mg twice a day.  Use MiraLax (over the counter) for constipation as needed.   Diet - low sodium heart healthy   Complete by: As directed    Diet - low sodium heart healthy   Complete by: As directed    Discharge instructions   Complete by: As directed    You may shower, dressing is waterproof.  Do not remove the dressing, we will remove it at your  first post-op appointment.  Do not take a bath or soak the knee in a tub or pool.  You may weightbear as you can tolerate on the operative leg with a walker.  Continue using the CPM machine 3 times per day for one hour each time, increasing the degrees of range of motion daily.  Use the blue cradle boot under your heel to work on getting your leg straight.  Do NOT put a pillow under your knee.  You will follow-up with Dr. Marlou Sa in the clinic in 2 weeks at your given appointment date.    INSTRUCTIONS AFTER JOINT REPLACEMENT   Remove items at home which could result in a fall. This includes throw rugs or furniture in walking pathways ICE to the affected joint every three hours while awake for 30 minutes at a time, for at least the first 3-5 days, and then as needed for pain and swelling.  Continue to use ice for pain and swelling. You may notice swelling that will progress down to the foot and ankle.  This is normal after surgery.  Elevate your leg when you are not up walking on it.   Continue to use the breathing machine you got in the hospital (incentive spirometer) which will help keep your temperature down.  It is common for your temperature to cycle up and down following surgery, especially at night when you are not up moving around and exerting yourself.  The breathing machine keeps your lungs expanded and your temperature down.   DIET:  As you were doing prior to hospitalization, we recommend a well-balanced  diet.  DRESSING / WOUND CARE / SHOWERING  Keep the surgical dressing until follow up.  The dressing is water proof, so you can shower without any extra covering.  IF THE DRESSING FALLS OFF or the wound gets wet inside, change the dressing with sterile gauze.  Please use good hand washing techniques before changing the dressing.  Do not use any lotions or creams on the incision until instructed by your surgeon.    ACTIVITY  Increase activity slowly as tolerated, but follow the weight bearing instructions below.   No driving for 6 weeks or until further direction given by your physician.  You cannot drive while taking narcotics.  No lifting or carrying greater than 10 lbs. until further directed by your surgeon. Avoid periods of inactivity such as sitting longer than an hour when not asleep. This helps prevent blood clots.  You may return to work once you are authorized by your doctor.     WEIGHT BEARING   Weight bearing as tolerated with assist device (walker, cane, etc) as directed, use it as long as suggested by your surgeon or therapist, typically at least 4-6 weeks.   EXERCISES  Results after joint replacement surgery are often greatly improved when you follow the exercise, range of motion and muscle strengthening exercises prescribed by your doctor. Safety measures are also important to protect the joint from further injury. Any time any of these exercises cause you to have increased pain or swelling, decrease what you are doing until you are comfortable again and then slowly increase them. If you have problems or questions, call your caregiver or physical therapist for advice.   Rehabilitation is important following a joint replacement. After just a few days of immobilization, the muscles of the leg can become weakened and shrink (atrophy).  These exercises are designed to build up the tone and strength of the thigh and leg  muscles and to improve motion. Often times heat used for twenty  to thirty minutes before working out will loosen up your tissues and help with improving the range of motion but do not use heat for the first two weeks following surgery (sometimes heat can increase post-operative swelling).   These exercises can be done on a training (exercise) mat, on the floor, on a table or on a bed. Use whatever works the best and is most comfortable for you.    Use music or television while you are exercising so that the exercises are a pleasant break in your day. This will make your life better with the exercises acting as a break in your routine that you can look forward to.   Perform all exercises about fifteen times, three times per day or as directed.  You should exercise both the operative leg and the other leg as well.  Exercises include:   Quad Sets - Tighten up the muscle on the front of the thigh (Quad) and hold for 5-10 seconds.   Straight Leg Raises - With your knee straight (if you were given a brace, keep it on), lift the leg to 60 degrees, hold for 3 seconds, and slowly lower the leg.  Perform this exercise against resistance later as your leg gets stronger.  Leg Slides: Lying on your back, slowly slide your foot toward your buttocks, bending your knee up off the floor (only go as far as is comfortable). Then slowly slide your foot back down until your leg is flat on the floor again.  Angel Wings: Lying on your back spread your legs to the side as far apart as you can without causing discomfort.  Hamstring Strength:  Lying on your back, push your heel against the floor with your leg straight by tightening up the muscles of your buttocks.  Repeat, but this time bend your knee to a comfortable angle, and push your heel against the floor.  You may put a pillow under the heel to make it more comfortable if necessary.   A rehabilitation program following joint replacement surgery can speed recovery and prevent re-injury in the future due to weakened muscles. Contact your  doctor or a physical therapist for more information on knee rehabilitation.    CONSTIPATION  Constipation is defined medically as fewer than three stools per week and severe constipation as less than one stool per week.  Even if you have a regular bowel pattern at home, your normal regimen is likely to be disrupted due to multiple reasons following surgery.  Combination of anesthesia, postoperative narcotics, change in appetite and fluid intake all can affect your bowels.   YOU MUST use at least one of the following options; they are listed in order of increasing strength to get the job done.  They are all available over the counter, and you may need to use some, POSSIBLY even all of these options:    Drink plenty of fluids (prune juice may be helpful) and high fiber foods Colace 100 mg by mouth twice a day  Senokot for constipation as directed and as needed Dulcolax (bisacodyl), take with full glass of water  Miralax (polyethylene glycol) once or twice a day as needed.  If you have tried all these things and are unable to have a bowel movement in the first 3-4 days after surgery call either your surgeon or your primary doctor.    If you experience loose stools or diarrhea, hold the medications until you  stool forms back up.  If your symptoms do not get better within 1 week or if they get worse, check with your doctor.  If you experience "the worst abdominal pain ever" or develop nausea or vomiting, please contact the office immediately for further recommendations for treatment.   ITCHING:  If you experience itching with your medications, try taking only a single pain pill, or even half a pain pill at a time.  You can also use Benadryl over the counter for itching or also to help with sleep.   TED HOSE STOCKINGS:  Use stockings on both legs until for at least 2 weeks or as directed by physician office. They may be removed at night for sleeping.  MEDICATIONS:  See your medication summary on the  "After Visit Summary" that nursing will review with you.  You may have some home medications which will be placed on hold until you complete the course of blood thinner medication.  It is important for you to complete the blood thinner medication as prescribed.  PRECAUTIONS:  If you experience chest pain or shortness of breath - call 911 immediately for transfer to the hospital emergency department.   If you develop a fever greater that 101 F, purulent drainage from wound, increased redness or drainage from wound, foul odor from the wound/dressing, or calf pain - CONTACT YOUR SURGEON.                                                   FOLLOW-UP APPOINTMENTS:  If you do not already have a post-op appointment, please call the office for an appointment to be seen by your surgeon.  Guidelines for how soon to be seen are listed in your "After Visit Summary", but are typically between 1-4 weeks after surgery.  OTHER INSTRUCTIONS:   Knee Replacement:  Do not place pillow under knee, focus on keeping the knee straight while resting. CPM instructions: 0-90 degrees, 2 hours in the morning, 2 hours in the afternoon, and 2 hours in the evening. Place foam block, curve side up under heel at all times except when in CPM or when walking.  DO NOT modify, tear, cut, or change the foam block in any way.  POST-OPERATIVE OPIOID TAPER INSTRUCTIONS: It is important to wean off of your opioid medication as soon as possible. If you do not need pain medication after your surgery it is ok to stop day one. Opioids include: Codeine, Hydrocodone(Norco, Vicodin), Oxycodone(Percocet, oxycontin) and hydromorphone amongst others.  Long term and even short term use of opiods can cause: Increased pain response Dependence Constipation Depression Respiratory depression And more.  Withdrawal symptoms can include Flu like symptoms Nausea, vomiting And more Techniques to manage these symptoms Hydrate well Eat regular healthy  meals Stay active Use relaxation techniques(deep breathing, meditating, yoga) Do Not substitute Alcohol to help with tapering If you have been on opioids for less than two weeks and do not have pain than it is ok to stop all together.  Plan to wean off of opioids This plan should start within one week post op of your joint replacement. Maintain the same interval or time between taking each dose and first decrease the dose.  Cut the total daily intake of opioids by one tablet each day Next start to increase the time between doses. The last dose that should  be eliminated is the evening dose.   MAKE SURE YOU:  Understand these instructions.  Get help right away if you are not doing well or get worse.    Thank you for letting us be a part of your medical care team.  It is a privilege we respect greatly.  We hope these instructions will help you stay on track for a fast and full recovery!    Dental Antibiotics:  In most cases prophylactic antibiotics for Dental procdeures after total joint surgery are not necessary.  Exceptions are as follows:  1. History of prior total joint infection  2. Severely immunocompromised (Organ Transplant, cancer chemotherapy, Rheumatoid biologic meds such as [place])  3. Poorly controlled diabetes (A1C &gt; 8.0, blood glucose over 200)  If you have one of these conditions, contact your surgeon for an antibiotic prescription, prior to your dental procedure.   Increase activity slowly as tolerated   Complete by: As directed    Increase activity slowly as tolerated   Complete by: As directed    Post-operative opioid taper instructions:   Complete by: As directed    POST-OPERATIVE OPIOID TAPER INSTRUCTIONS: It is important to wean off of your opioid medication as soon as possible. If you do not need pain medication after your surgery it is ok to stop day one. Opioids include: Codeine, Hydrocodone(Norco, Vicodin), Oxycodone(Percocet, oxycontin) and  hydromorphone amongst others.  Long term and even short term use of opiods can cause: Increased pain response Dependence Constipation Depression Respiratory depression And more.  Withdrawal symptoms can include Flu like symptoms Nausea, vomiting And more Techniques to manage these symptoms Hydrate well Eat regular healthy meals Stay active Use relaxation techniques(deep breathing, meditating, yoga) Do Not substitute Alcohol to help with tapering If you have been on opioids for less than two weeks and do not have pain than it is ok to stop all together.  Plan to wean off of opioids This plan should start within one week post op of your joint replacement. Maintain the same interval or time between taking each dose and first decrease the dose.  Cut the total daily intake of opioids by one tablet each day Next start to increase the time between doses. The last dose that should be eliminated is the evening dose.      Post-operative opioid taper instructions:   Complete by: As directed    POST-OPERATIVE OPIOID TAPER INSTRUCTIONS: It is important to wean off of your opioid medication as soon as possible. If you do not need pain medication after your surgery it is ok to stop day one. Opioids include: Codeine, Hydrocodone(Norco, Vicodin), Oxycodone(Percocet, oxycontin) and hydromorphone amongst others.  Long term and even short term use of opiods can cause: Increased pain response Dependence Constipation Depression Respiratory depression And more.  Withdrawal symptoms can include Flu like symptoms Nausea, vomiting And more Techniques to manage these symptoms Hydrate well Eat regular healthy meals Stay active Use relaxation techniques(deep breathing, meditating, yoga) Do Not substitute Alcohol to help with tapering If you have been on opioids for less than two weeks and do not have pain than it is ok to stop all together.  Plan to wean off of opioids This plan should start  within one week post op of your joint replacement. Maintain the same interval or time between taking each dose and first decrease the dose.  Cut the total daily intake of opioids by one tablet each day Next start to increase the time between doses. The last  dose that should be eliminated is the evening dose.          Contact information for after-discharge care     Destination     [facility] .   Service: Skilled Nursing Contact information: [address] [place] [phone]                      Signed: Newt Minion 04/17/2022, 5:34 PM

## 2022-04-17 NOTE — Progress Notes (Addendum)
PT Cancellation Note  Patient Details Name: [name] MRN: [id number] DOB: 1951-09-29   Cancelled Treatment:    Reason Eval/Treat Not Completed: Pt states she will not participate until she is bathed. Nurse notified. Pt educated on importance of mobility. Will retry in afternoon.   [name] 04/17/2022, 9:55 AM

## 2022-04-17 NOTE — Discharge Summary (Signed)
Physician Discharge Summary      Patient ID: [name] MRN: [id number] DOB/AGE: 73-Nov-1952 73 y.o.  Admit date: 04/13/2022 Discharge date: 04/17/2022  Admission Diagnoses:  Principal Problem:   DJD (degenerative joint disease) of knee Active Problems:   S/P TKR (total knee replacement), left   Discharge Diagnoses:  Same  Surgeries: Procedure(s): LEFT TOTAL KNEE ARTHROPLASTY on 04/13/2022   Consultants:   Discharged Condition: Stable  Hospital Course: [name] is an 71 y.o. female who was admitted 04/13/2022 with a chief complaint of left knee pain, and found to have a diagnosis of left knee osteoarthritis.  They were brought to the operating room on 04/13/2022 and underwent the above named procedures.  Pt awoke from anesthesia without complication and was transferred to the floor. On POD1, patient's pain was controlled. She has struggle to mobilize throughout her stay and due to the fact that she lives alone, she was discharge to SNF on 04/17/22.  No complaint of CP, SOB, abd pain throughout her stay.  Hgb improved back to baseline after initial drop; did require one unit transfusion on 7/16.  Pt will f/u with Dr. Marlou Sa in clinic in ~2 weeks postop.   Antibiotics given:  Anti-infectives (From admission, onward)    Start     Dose/Rate Route Frequency Ordered Stop   04/14/22 0600  ceFAZolin (ANCEF) IVPB 2g/100 mL premix        2 g 200 mL/hr over 30 Minutes Intravenous On call to O.R. 04/13/22 1325 04/13/22 1605   04/13/22 1703  vancomycin (VANCOCIN) powder  Status:  Discontinued          As needed 04/13/22 1703 04/13/22 1918   04/13/22 0000  ceFAZolin (ANCEF) IVPB 2g/100 mL premix        2 g 200 mL/hr over 30 Minutes Intravenous Every 8 hours 04/13/22 2117 04/14/22 2359     .  Recent vital signs:  Vitals:   04/17/22 0442 04/17/22 0824  BP: 123/82 117/76  Pulse: 99 100  Resp: 16 18  Temp: 97.7 F (36.5 C) 97.8 F (36.6 C)  SpO2: 100% 98%    Recent  laboratory studies:  Results for orders placed or performed during the hospital encounter of 04/13/22  Urine Culture   Specimen: Urine, Catheterized  Result Value Ref Range   Specimen Description URINE, CATHETERIZED    Special Requests      NONE Performed at [facility] [address] [place], [place] [address]    Culture >=100,000 COLONIES/mL PSEUDOMONAS AERUGINOSA (A)    Report Status 04/15/2022 FINAL    Organism ID, Bacteria PSEUDOMONAS AERUGINOSA (A)       Susceptibility   Pseudomonas aeruginosa - MIC*    CEFTAZIDIME 4 SENSITIVE Sensitive     CIPROFLOXACIN <=0.25 SENSITIVE Sensitive     GENTAMICIN 2 SENSITIVE Sensitive     IMIPENEM 2 SENSITIVE Sensitive     PIP/TAZO 16 SENSITIVE Sensitive     CEFEPIME 2 SENSITIVE Sensitive     * >=100,000 COLONIES/mL PSEUDOMONAS AERUGINOSA  CBC  Result Value Ref Range   WBC 11.4 (H) 4.0 - 10.5 K/uL   RBC 3.03 (L) 3.87 - 5.11 MIL/uL   Hemoglobin 7.8 (L) 12.0 - 15.0 g/dL   HCT 25.9 (L) 36.0 - 46.0 %   MCV 85.5 80.0 - 100.0 fL   MCH 25.7 (L) 26.0 - 34.0 pg   MCHC 30.1 30.0 - 36.0 g/dL   RDW 18.9 (H) 11.5 - 15.5 %  Platelets 640 (H) 150 - 400 K/uL   nRBC 0.0 0.0 - 0.2 %  CBC  Result Value Ref Range   WBC 8.4 4.0 - 10.5 K/uL   RBC 2.93 (L) 3.87 - 5.11 MIL/uL   Hemoglobin 7.7 (L) 12.0 - 15.0 g/dL   HCT 24.7 (L) 36.0 - 46.0 %   MCV 84.3 80.0 - 100.0 fL   MCH 26.3 26.0 - 34.0 pg   MCHC 31.2 30.0 - 36.0 g/dL   RDW 18.8 (H) 11.5 - 15.5 %   Platelets 659 (H) 150 - 400 K/uL   nRBC 0.0 0.0 - 0.2 %  Hemoglobin and hematocrit, blood  Result Value Ref Range   Hemoglobin 9.3 (L) 12.0 - 15.0 g/dL   HCT 29.5 (L) 36.0 - 46.0 %  Prepare RBC (crossmatch)  Result Value Ref Range   Order Confirmation      ORDER PROCESSED BY BLOOD BANK Performed at [facility], [address], [place], [place] [address]   Type and screen [place]  Result Value Ref Range   ABO/RH(D) O POS    Antibody Screen NEG    Sample  Expiration 04/19/2022,2359    Unit Number [id number]    Blood Component Type RED CELLS,LR    Unit division 00    Status of Unit ISSUED,FINAL    Transfusion Status OK TO TRANSFUSE    Crossmatch Result      Compatible Performed at [facility], [place] [address], [place], [place] [address]   ABO/Rh  Result Value Ref Range   ABO/RH(D)      O POS Performed at [facility] [address] [place], [place] [address]   BPAM [facility]  Result Value Ref Range   ISSUE DATE / TIME [id number]    Blood Product Unit Number [id number]    PRODUCT CODE [id number]    Unit Type and Rh 5100    Blood Product Expiration Date [id number]     Discharge Medications:   Allergies as of 04/17/2022       Reactions   Ibuprofen Rash   Sulfa Antibiotics Rash        Medication List     STOP taking these medications    aspirin 81 MG chewable tablet   estradiol 1 MG tablet Commonly known as: ESTRACE       TAKE these medications    acetaminophen 500 MG tablet Commonly known as: TYLENOL Take 1,000 mg by mouth in the morning and at bedtime.   albuterol 108 (90 Base) MCG/ACT inhaler Commonly known as: VENTOLIN HFA TAKE 2 PUFFS BY MOUTH EVERY 6 HOURS AS NEEDED   Biofreeze 4 % Gel Generic drug: Menthol (Topical Analgesic) Apply 1 Application topically in the morning, at noon, and at bedtime. Apply to left knee and both wrist   cyclobenzaprine 5 MG tablet Commonly known as: FLEXERIL Take 5 mg by mouth every 8 (eight) hours as needed for muscle spasms.   cycloSPORINE 0.05 % ophthalmic emulsion Commonly known as: RESTASIS Place 1 drop into both eyes 2 (two) times daily.   docusate sodium 100 MG capsule Commonly known as: COLACE Take 100 mg by mouth daily.   DULoxetine 30 MG capsule Commonly known as: CYMBALTA Take 30 mg by mouth daily.   fluticasone 50 MCG/ACT nasal spray Commonly known as: FLONASE Place 1 spray into both nostrils daily.   gabapentin 300 MG  capsule Commonly known as: NEURONTIN Take 300 mg by mouth 2 (two)  times daily.   guaiFENesin-dextromethorphan 100-10 MG/5ML syrup Commonly known as: ROBITUSSIN DM Take 10 mLs by mouth every 6 (six) hours as needed for cough.   levocetirizine 5 MG tablet Commonly known as: Xyzal Allergy 24HR Take 1 tablet (5 mg total) by mouth every evening. What changed: when to take this   omeprazole 20 MG capsule Commonly known as: PRILOSEC Take 20 mg by mouth in the morning and at bedtime.   ondansetron 8 MG disintegrating tablet Commonly known as: ZOFRAN-ODT Take 8 mg by mouth 2 (two) times daily as needed for vomiting or nausea.   oxyCODONE 20 mg 12 hr tablet Commonly known as: OXYCONTIN Take 20 mg by mouth daily as needed (osteoarthritis).   Oxycodone HCl 10 MG Tabs Take 1 tablet (10 mg total) by mouth every 4 (four) hours as needed for severe pain.   polyethylene glycol powder 17 GM/SCOOP powder Commonly known as: GLYCOLAX/MIRALAX Take 17 g by mouth daily as needed. What changed: reasons to take this   psyllium 0.52 g capsule Commonly known as: REGULOID Take 0.52 g by mouth at bedtime.   rivaroxaban 10 MG Tabs tablet Commonly known as: XARELTO Take 1 tablet (10 mg total) by mouth daily.   sennosides-docusate sodium 8.6-50 MG tablet Commonly known as: SENOKOT-S Take 1 tablet by mouth every Monday, Wednesday, and Friday at 8 PM.   Vitamin D (Ergocalciferol) 1.25 MG (50000 UNIT) Caps capsule Commonly known as: DRISDOL Take 1 capsule (50,000 Units total) by mouth every 7 (seven) days. What changed: when to take this   Wixela Inhub 250-50 MCG/ACT Aepb Generic drug: fluticasone-salmeterol INHALE 1 PUFF BY MOUTH TWICE A DAY        Diagnostic Studies: No results found.  Disposition:      Contact information for after-discharge care     Destination     [facility] Preferred SNF .   Service: Skilled Nursing Contact information: [address] [phone]                      Signed: Donella Stade 04/17/2022, 2:14 PM

## 2022-04-17 NOTE — TOC Progression Note (Addendum)
Transition of Care [facility]) - Progression Note    Patient Details  Name: [name] MRN: [id number] Date of Birth: 10/09/1950  Transition of Care [facility]) CM/SW Contact  Joanne Chars, LCSW Phone Number: 04/17/2022, 12:11 PM  Clinical Narrative:   Phone call from [facility] requesting clinical info, which was uploaded into the portal.  1310: auth approved by Everlene Balls: [id number], [id number], 3 days; 7/17-7/19.  PA informed.    Expected Discharge Plan: [place] Barriers to Discharge: Continued Medical Work up, SNF Pending bed offer  Expected Discharge Plan and Services Expected Discharge Plan: [place] In-house Referral: Clinical Social Work   Post Acute Care Choice: [place] Living arrangements for the past 2 months: Single Family Home                                       Social Determinants of Health (SDOH) Interventions    Readmission Risk Interventions     No data to display

## 2022-04-17 NOTE — TOC Transition Note (Signed)
Transition of Care [facility]) - CM/SW Discharge Note   Patient Details  Name: RANIYAH CURENTON MRN: [id number] Date of Birth: [birth year]  Transition of Care [facility]) CM/SW Contact:  Joanne Chars, LCSW Phone Number: 04/17/2022, 3:15 PM   Clinical Narrative:   Pt discharging to [facility].  RN call [phone] for report.     Final next level of care: Skilled Nursing Facility Barriers to Discharge: Barriers Resolved   Patient Goals and CMS Choice Patient states their goals for this hospitalization and ongoing recovery are:: be able to live alone CMS Medicare.gov Compare Post Acute Care list provided to:: Patient Choice offered to / list presented to : Patient  Discharge Placement              Patient chooses bed at:  [facility]) Patient to be transferred to facility by: [place] Name of family member notified: sister [name], friend [name] Patient and family notified of of transfer: 04/17/22  Discharge Plan and Services In-house Referral: Clinical Social Work   Post Acute Care Choice: [place]                               Social Determinants of Health (SDOH) Interventions     Readmission Risk Interventions     No data to display

## 2022-04-17 NOTE — Plan of Care (Signed)

## 2022-04-19 DIAGNOSIS — F331 Major depressive disorder, recurrent, moderate: Secondary | ICD-10-CM | POA: Diagnosis not present

## 2022-04-19 DIAGNOSIS — M79605 Pain in left leg: Secondary | ICD-10-CM | POA: Diagnosis not present

## 2022-04-24 DIAGNOSIS — R2242 Localized swelling, mass and lump, left lower limb: Secondary | ICD-10-CM | POA: Diagnosis not present

## 2022-04-24 DIAGNOSIS — M545 Low back pain, unspecified: Secondary | ICD-10-CM | POA: Diagnosis not present

## 2022-04-25 ENCOUNTER — Telehealth: Payer: Self-pay

## 2022-04-25 DIAGNOSIS — L03116 Cellulitis of left lower limb: Secondary | ICD-10-CM | POA: Diagnosis not present

## 2022-04-25 DIAGNOSIS — Z7689 Persons encountering health services in other specified circumstances: Secondary | ICD-10-CM | POA: Diagnosis not present

## 2022-04-25 IMAGING — DX DG KNEE COMPLETE 4+V*R*
4 series · 4 of 4 positions shown · non-contrast
Comparison: None.

CLINICAL DATA: Chronic bilateral knee pain.

EXAM:
LEFT KNEE - COMPLETE 4+ VIEW; RIGHT KNEE - COMPLETE 4+ VIEW

[x knee ap right]
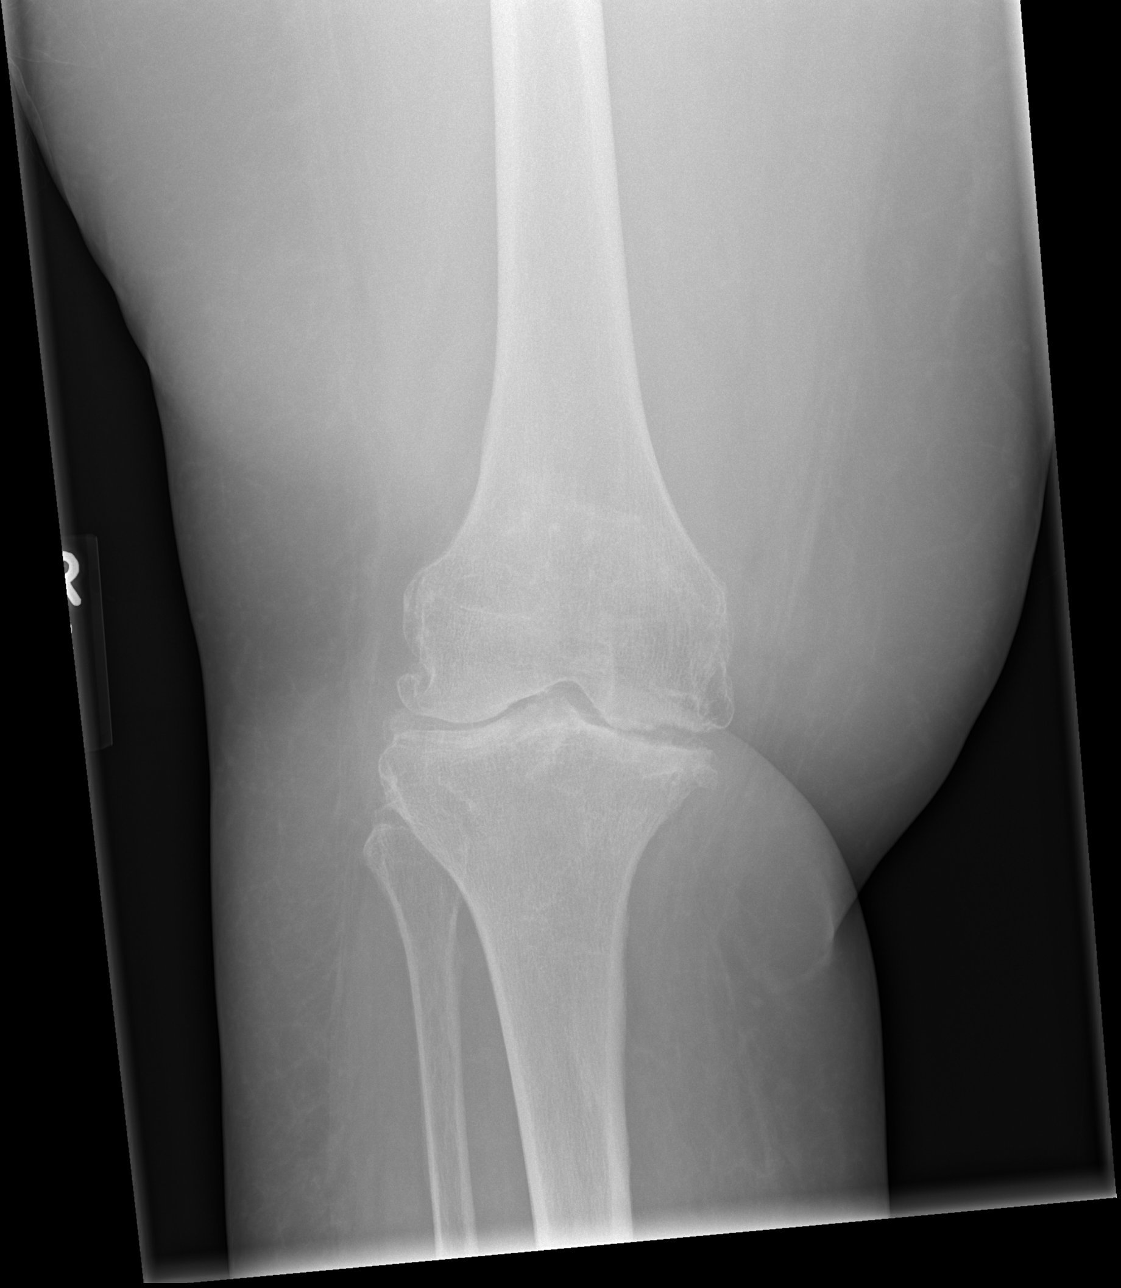

[x knee obl right (1 of 2)]
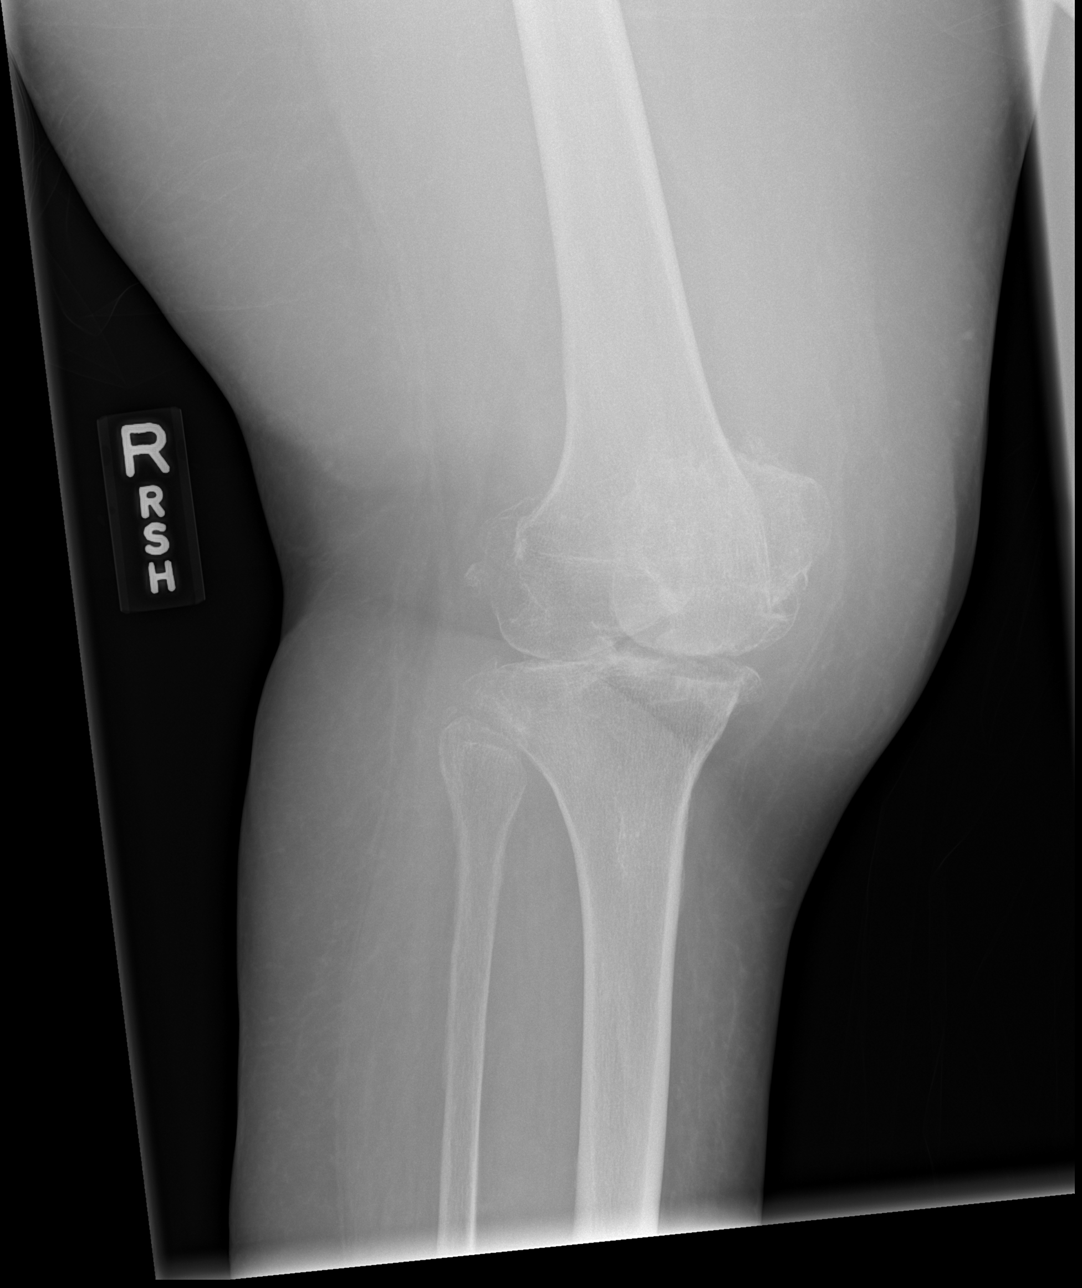

[x knee obl right (2 of 2)]
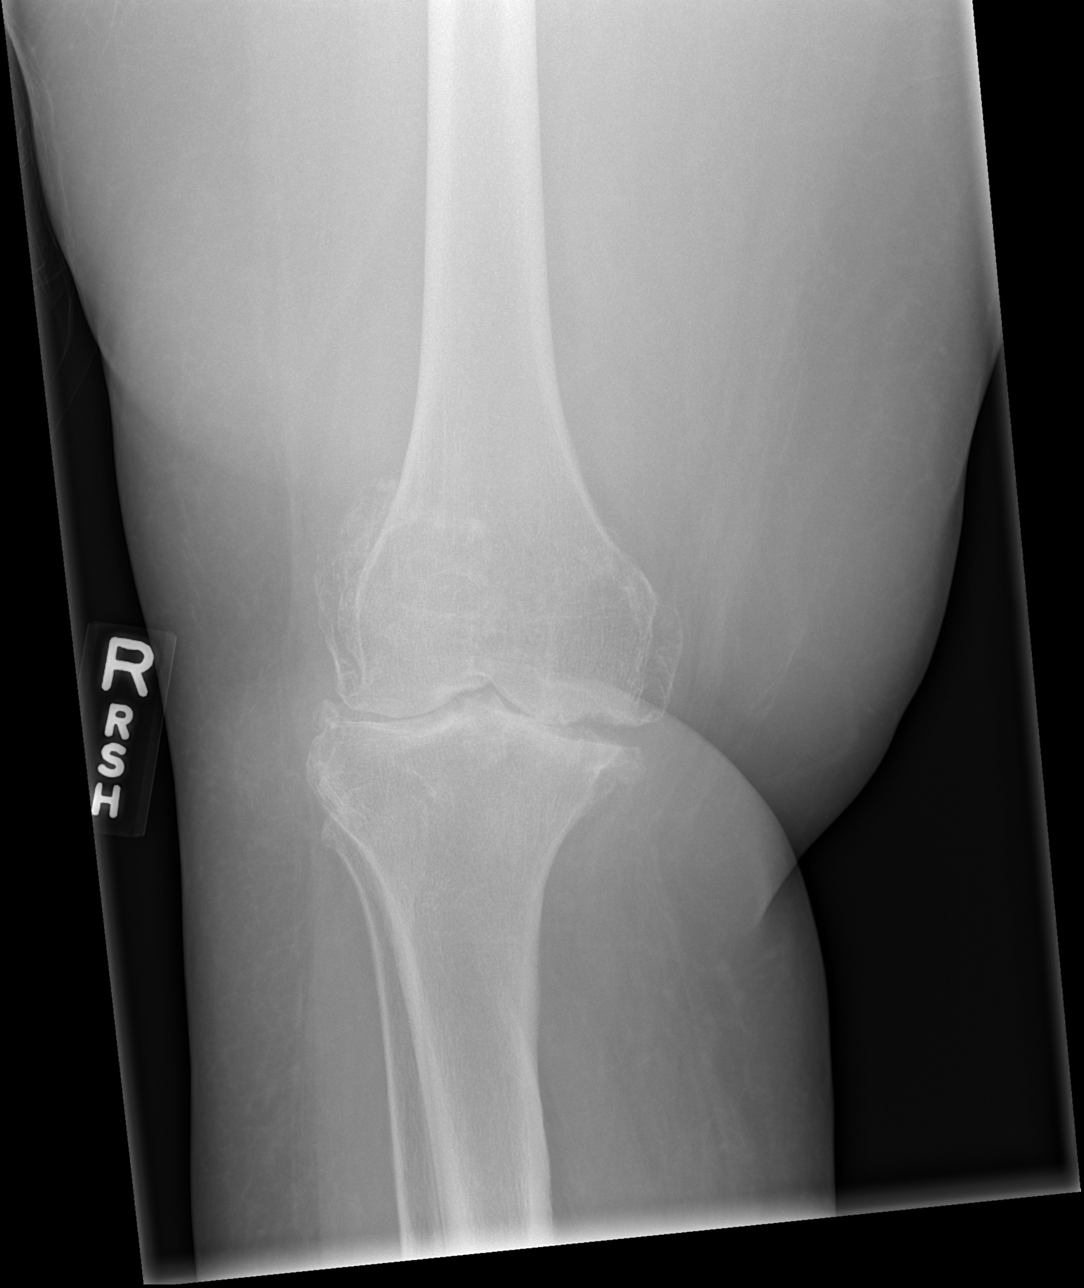

[x knee lat right]
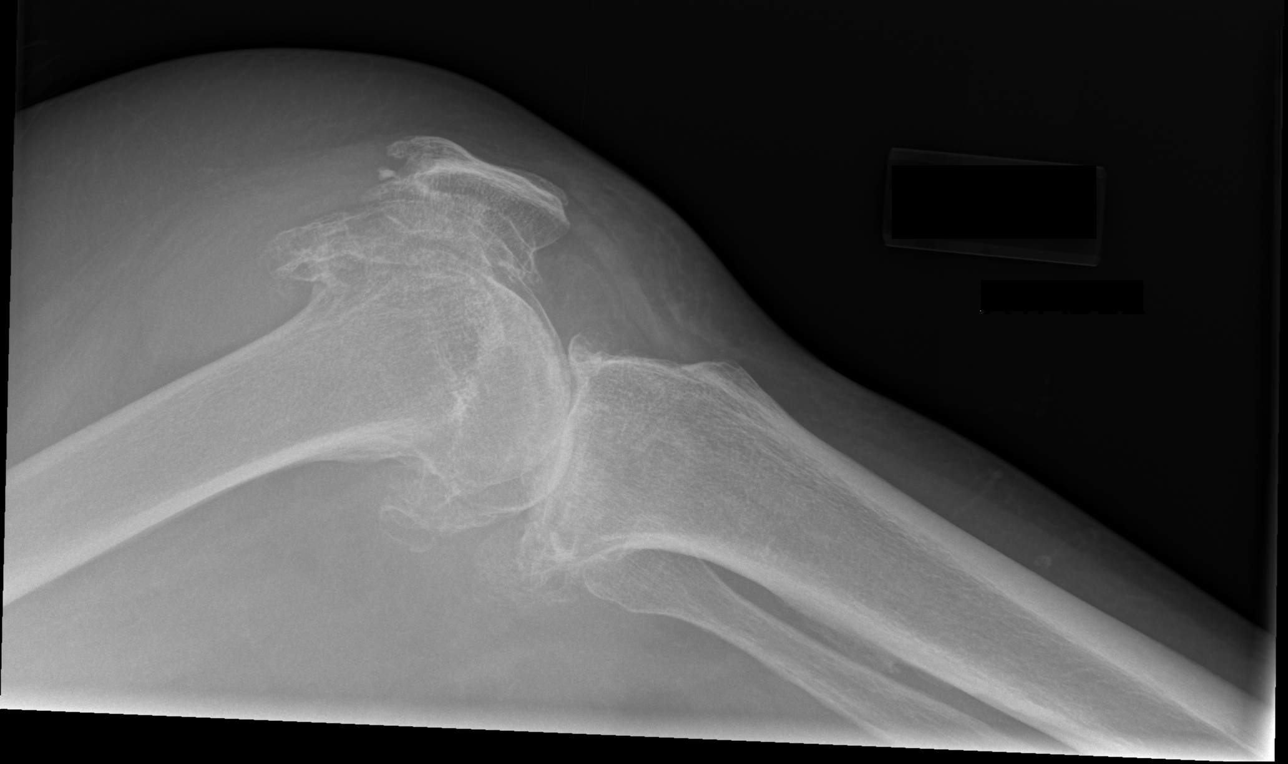

[4 of 4 positions shown; findings below may reference images not displayed]

FINDINGS: Bones: No acute fracture or dislocation. Generalized osteopenia. No
periostitis.

Joints: Normal alignment. No erosive changes. Severe medial
femorotibial compartment joint space narrowing bilaterally. Moderate
lateral femorotibial compartment joint space narrowing bilaterally.
Severe patellofemoral compartment joint space narrowing bilaterally.
Large left knee joint effusion. Moderate right knee joint effusion.
Multiple loose bodies in the posterior aspect of the left knee joint
space.

Soft tissue: No soft tissue abnormality. No radiopaque foreign body.
No chondrocalcinosis.
IMPRESSION: Advanced tricompartmental osteoarthritis of bilateral knees.

## 2022-04-25 IMAGING — DX DG KNEE COMPLETE 4+V*L*
4 series · 4 of 4 positions shown · non-contrast
Comparison: None.

CLINICAL DATA: Chronic bilateral knee pain.

EXAM:
LEFT KNEE - COMPLETE 4+ VIEW; RIGHT KNEE - COMPLETE 4+ VIEW

[x knee ap left]
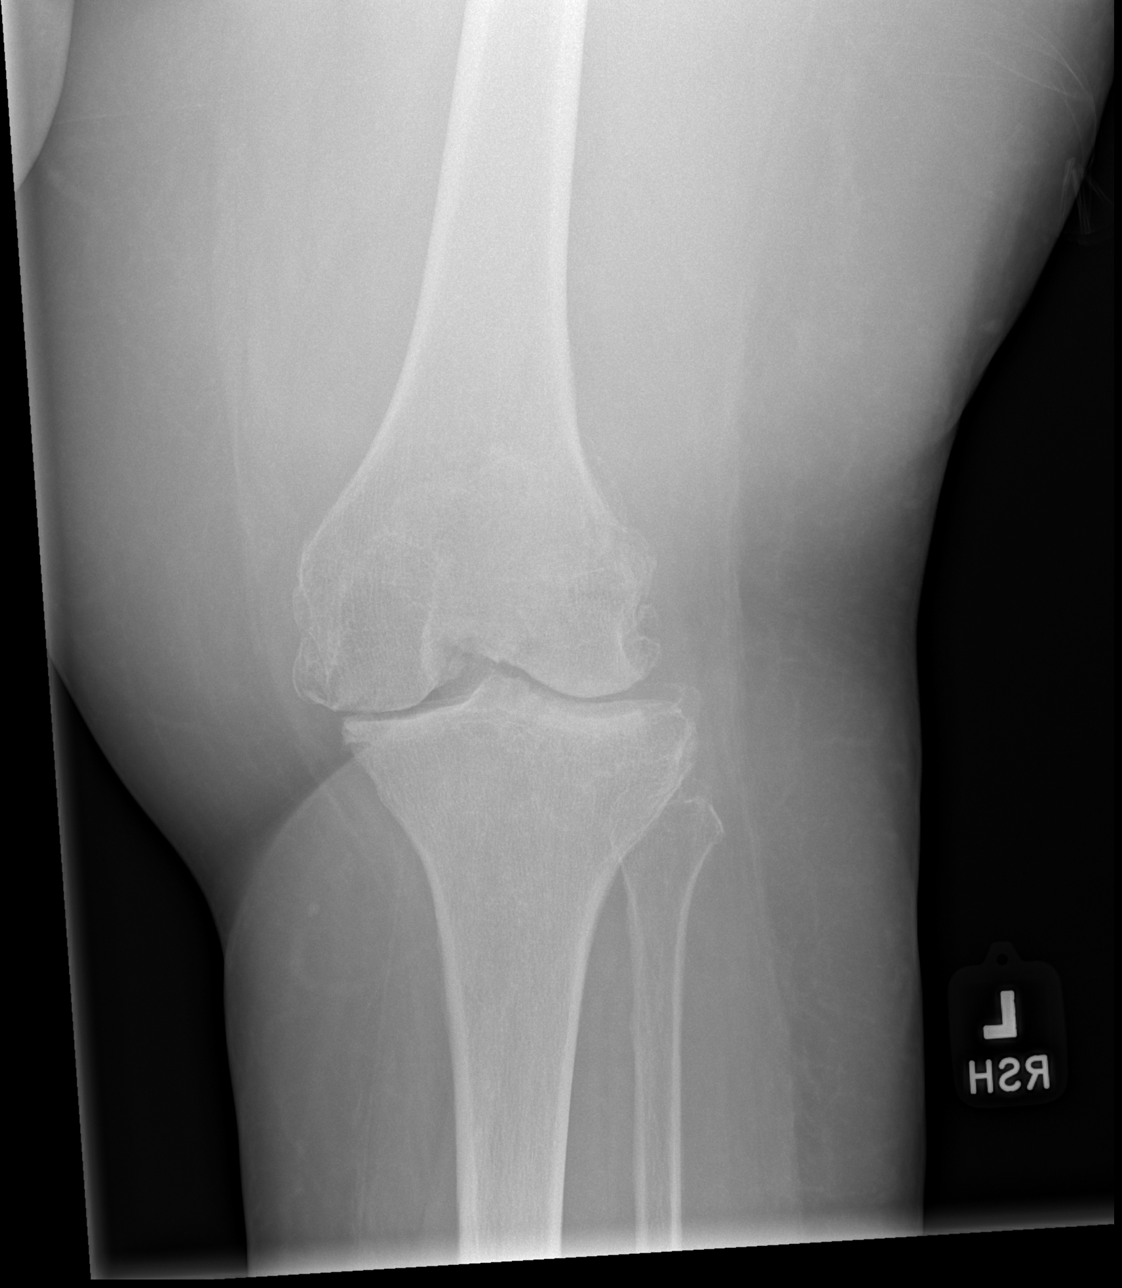

[x knee obl left (1 of 2)]
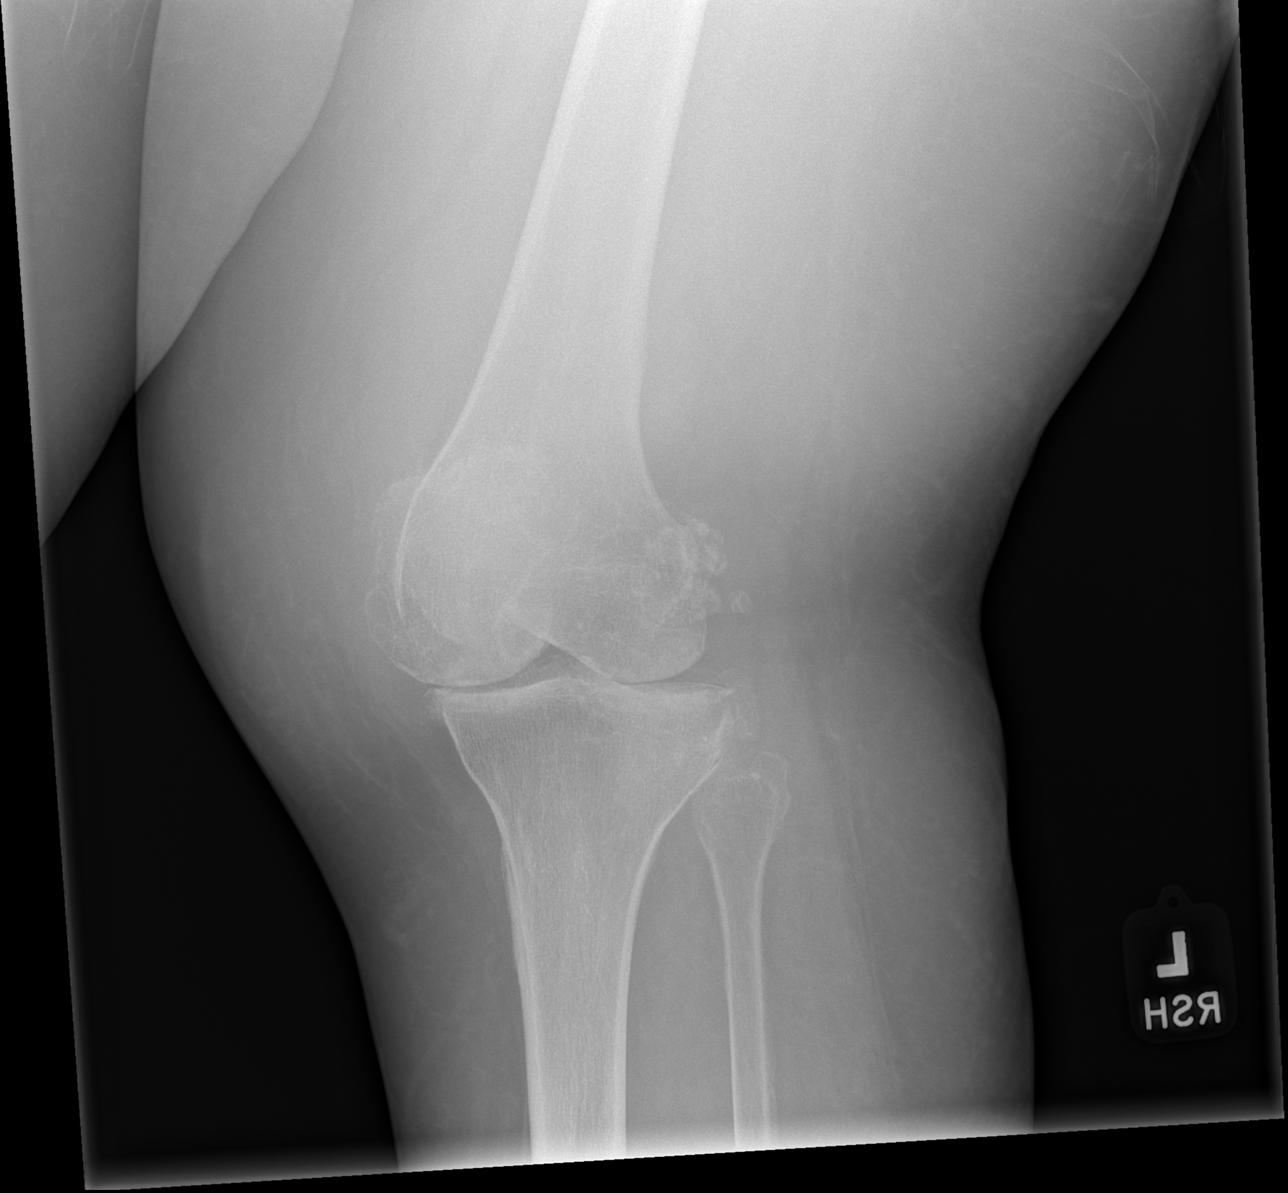

[x knee obl left (2 of 2)]
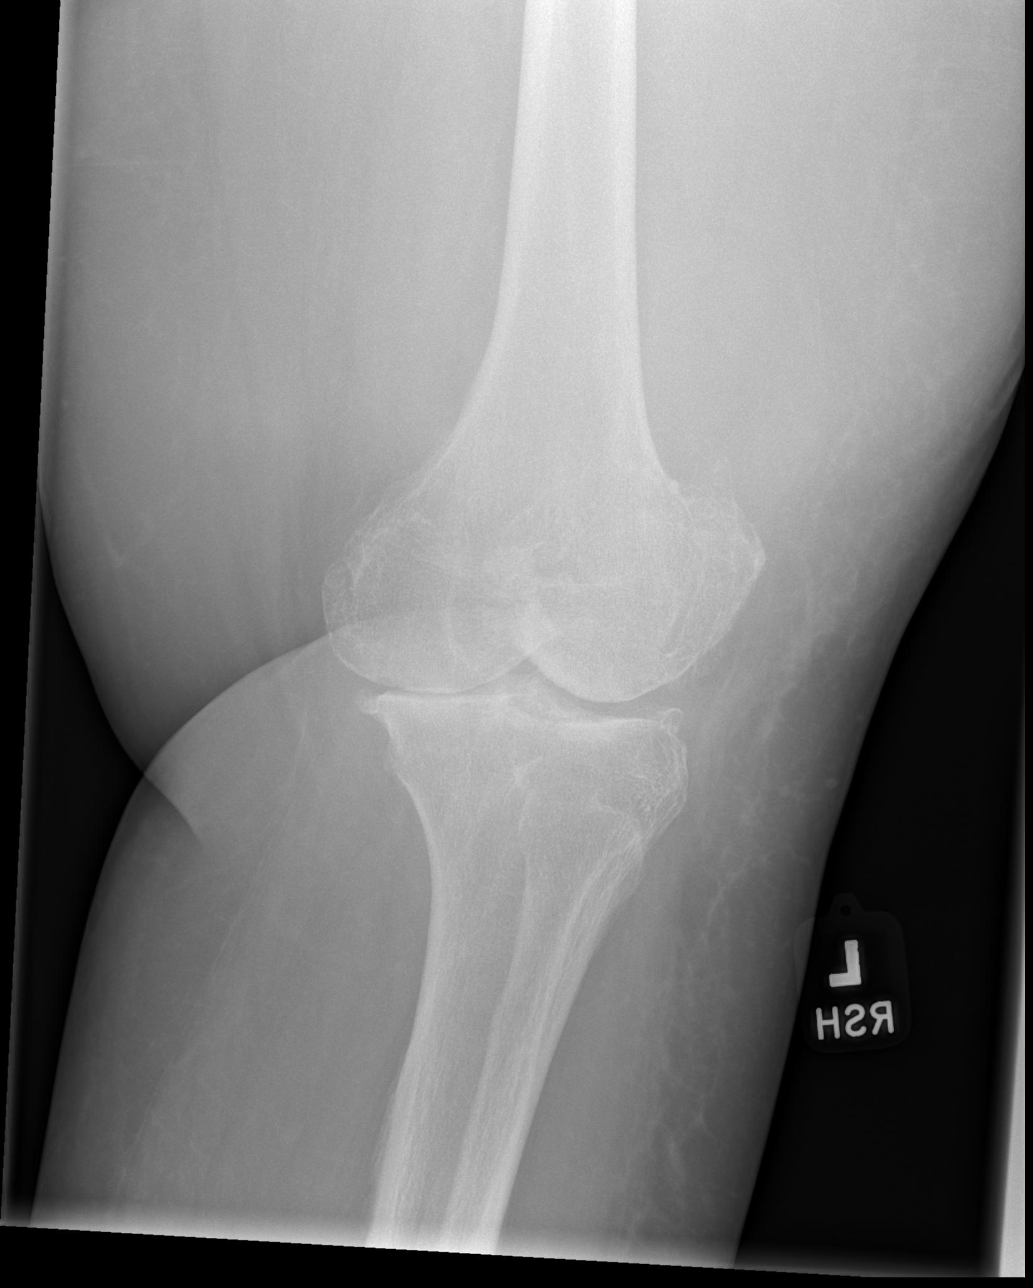

[x knee lat left]
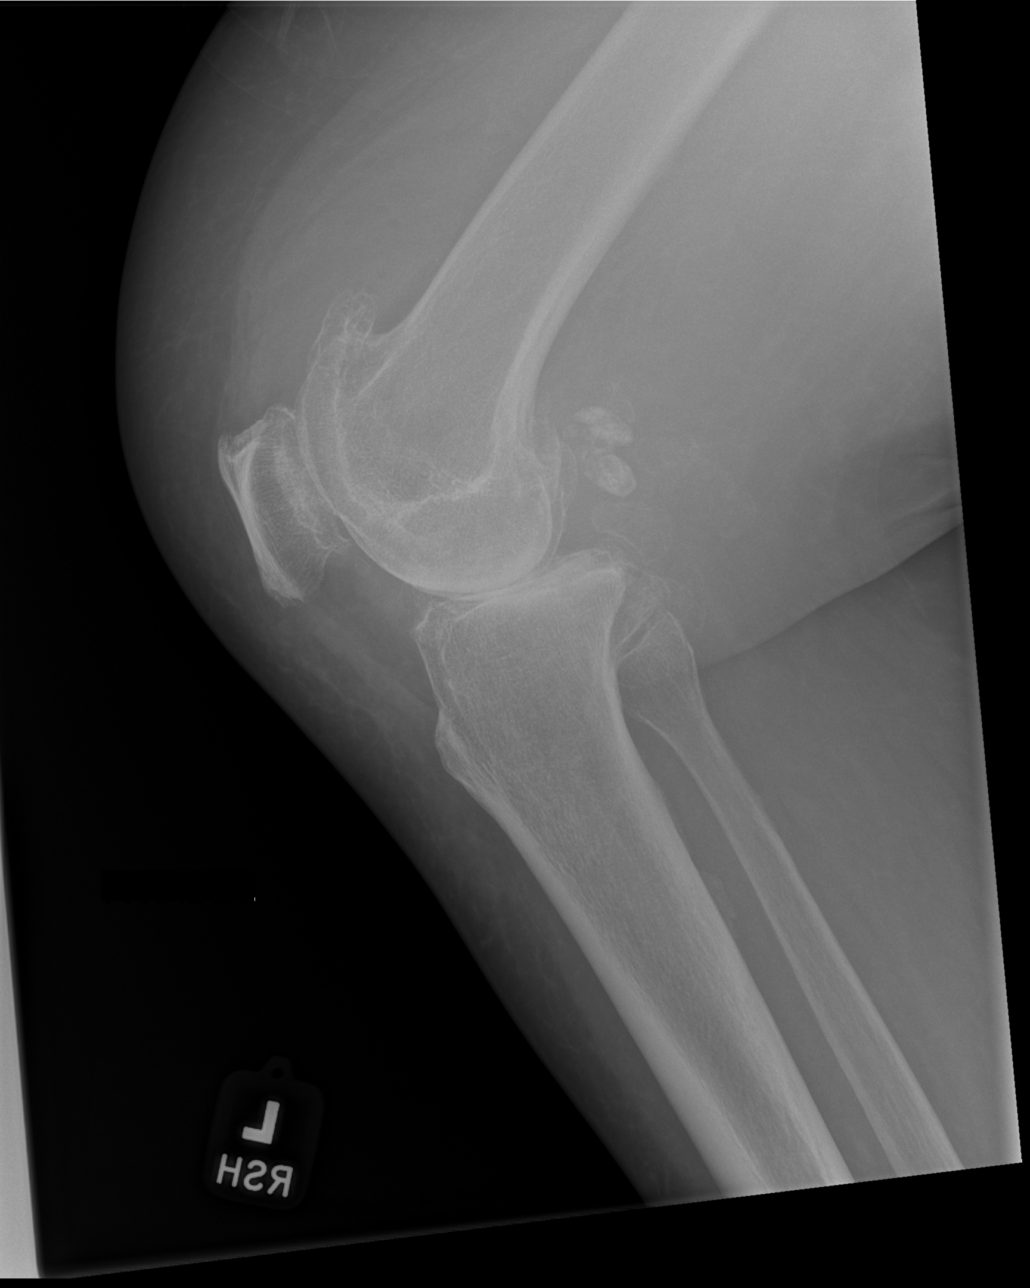

[4 of 4 positions shown; findings below may reference images not displayed]

FINDINGS: Bones: No acute fracture or dislocation. Generalized osteopenia. No
periostitis.

Joints: Normal alignment. No erosive changes. Severe medial
femorotibial compartment joint space narrowing bilaterally. Moderate
lateral femorotibial compartment joint space narrowing bilaterally.
Severe patellofemoral compartment joint space narrowing bilaterally.
Large left knee joint effusion. Moderate right knee joint effusion.
Multiple loose bodies in the posterior aspect of the left knee joint
space.

Soft tissue: No soft tissue abnormality. No radiopaque foreign body.
No chondrocalcinosis.
IMPRESSION: Advanced tricompartmental osteoarthritis of bilateral knees.

## 2022-04-25 NOTE — Telephone Encounter (Signed)
Triage call:  Antionette Poles, director of nursing at [facility] is calling regarding the patient's wound vac, post knee surgery. Per post op instructions, this was to remain intact until her follow up in office on 04/28/22. The patient removed it today, to go to a beautician appointment.   When the patient returns, does she need to reconnect the wound vac or is there a different type of dressing she should apply?   Her cb [phone], or if you cannot reach her, call #[phone] to get a supervisor.

## 2022-04-26 DIAGNOSIS — D75839 Thrombocytosis, unspecified: Secondary | ICD-10-CM | POA: Diagnosis not present

## 2022-04-26 DIAGNOSIS — Z7689 Persons encountering health services in other specified circumstances: Secondary | ICD-10-CM | POA: Diagnosis not present

## 2022-04-26 DIAGNOSIS — D649 Anemia, unspecified: Secondary | ICD-10-CM | POA: Diagnosis not present

## 2022-04-26 DIAGNOSIS — R112 Nausea with vomiting, unspecified: Secondary | ICD-10-CM | POA: Diagnosis not present

## 2022-04-26 DIAGNOSIS — N181 Chronic kidney disease, stage 1: Secondary | ICD-10-CM | POA: Diagnosis not present

## 2022-04-26 NOTE — Telephone Encounter (Signed)
If the wound VAC is still in place but just disconnected from the battery pack I think it is fine to reconnect the battery pack until the battery pack runs out.  However if she peeled the entire wound VAC off so that the sponge has been exposed then I would leave it off and just have her use an Ace wrap around the knee

## 2022-04-26 NOTE — Telephone Encounter (Signed)
IC LMVM per Dr Forbes Cellar note. Tried to call other number provided but not working number

## 2022-04-27 DIAGNOSIS — K5900 Constipation, unspecified: Secondary | ICD-10-CM | POA: Diagnosis not present

## 2022-04-27 DIAGNOSIS — Z7689 Persons encountering health services in other specified circumstances: Secondary | ICD-10-CM | POA: Diagnosis not present

## 2022-04-28 ENCOUNTER — Telehealth: Payer: Self-pay | Admitting: *Deleted

## 2022-04-28 ENCOUNTER — Ambulatory Visit (INDEPENDENT_AMBULATORY_CARE_PROVIDER_SITE_OTHER): Payer: Medicare PPO

## 2022-04-28 ENCOUNTER — Encounter: Payer: Self-pay | Admitting: Orthopedic Surgery

## 2022-04-28 ENCOUNTER — Ambulatory Visit (INDEPENDENT_AMBULATORY_CARE_PROVIDER_SITE_OTHER): Payer: Medicare PPO | Admitting: Orthopedic Surgery

## 2022-04-28 DIAGNOSIS — M179 Osteoarthritis of knee, unspecified: Secondary | ICD-10-CM | POA: Diagnosis not present

## 2022-04-28 DIAGNOSIS — R600 Localized edema: Secondary | ICD-10-CM | POA: Diagnosis not present

## 2022-04-28 DIAGNOSIS — Z96652 Presence of left artificial knee joint: Secondary | ICD-10-CM

## 2022-04-28 DIAGNOSIS — Z471 Aftercare following joint replacement surgery: Secondary | ICD-10-CM | POA: Diagnosis not present

## 2022-04-28 DIAGNOSIS — R262 Difficulty in walking, not elsewhere classified: Secondary | ICD-10-CM | POA: Diagnosis not present

## 2022-04-28 NOTE — Telephone Encounter (Signed)
14 day in office meeting today during patient's post op with Dr. Marlou Sa. F/U in 1 week per Dr. Marlou Sa. Incisional vac removed and slight drainage at top of incision. Small aquacel re-applied. Updated therapy at [facility] on appointment details today. Discussed that patient is really not making progress with therapy and may need long term care.

## 2022-04-28 NOTE — Progress Notes (Signed)
Post-Op Visit Note   Patient: [name]           Date of Birth: June 01, 1951           MRN: [id number] Visit Date: 04/28/2022 PCP: [name], MD   Assessment & Plan:  Chief Complaint:  Chief Complaint  Patient presents with   Left Knee - Routine Post Op   Visit Diagnoses:  1. History of left knee replacement   2. Status post left knee replacement     Plan: [name] is a 71 year old patient who is now 2 weeks out left total knee replacement.  She has been in the skilled nursing facility.  Has had a incisional VAC on which was removed today.  Patient states she has not been eating well.  I do not think she has been up too much either by her report.  Burt Knack is double checking on her status in terms of how active she has been in therapy.  Overall the knee bends well to 90 degrees easily.  Lacks about 10 to 12 degrees of full extension.  Just able to do straight leg raising but probably not really enough strength yet for full ambulation.  Incision has 1 area about 5 mm long at the proximal aspect which has not fully healed.  No fluctuance erythema and only scant serous drainage present but this needs to be checked again in a week.  Aquacel dressing placed.  Follow-Up Instructions: Return in about 1 week (around 05/05/2022).   Orders:  Orders Placed This Encounter  Procedures   XR Knee 1-2 Views Left   No orders of the defined types were placed in this encounter.   Imaging: XR Knee 1-2 Views Left  Result Date: 04/28/2022 AP lateral radiographs left total knee replacement demonstrates cemented components in good position alignment with no complicating features.  No periprosthetic fracture.   PMFS History: Patient Active Problem List   Diagnosis Date Noted   DJD (degenerative joint disease) of knee 04/13/2022   S/P TKR (total knee replacement), left 04/13/2022   Arthritis of right knee    S/P total knee arthroplasty, right 12/08/2021   Pressure injury of skin  08/27/2021   Fall 08/23/2021   Difficulty in walking 08/23/2021   Bilateral lower extremity edema 08/23/2021   Hormone replacement therapy 08/23/2021   Osteoporosis 01/28/2021   Osteoarthritis of right wrist 01/28/2021   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   Foot pain 09/13/2020   Knee pain 08/24/2018   Fall in home 07/14/2015   TIBIALIS TENDINITIS 08/25/2009   CHEST PAIN, OTHER, PAIN 10/21/2008   PEDAL EDEMA 07/22/2008   ANKLE PAIN, BILATERAL 04/01/2008   LOW BACK PAIN 04/01/2008   ACUTE BRONCHITIS 09/23/2007   SARCOIDOSIS 09/02/2007   Asthma 09/02/2007   Diverticulosis of colon 07/11/2007   BACK PAIN, CHRONIC 07/11/2007   Past Medical History:  Diagnosis Date   Acute bronchitis    Allergy    Anxiety    Arthritis    knees   Asthma    Backache, unspecified    Cataract    removed both eyes   Depression    Diverticulitis    Diverticulosis of colon (without mention of hemorrhage)    Edema    GERD (gastroesophageal reflux disease)    Glaucoma    Lumbago    Obesity    Other chest pain    Pain in joint, ankle and foot    Sarcoidosis    Tibialis tendinitis  Unspecified asthma(493.90)     Family History  Problem Relation Age of Onset   Hypertension Mother    Stroke Mother    Diabetes Father    Sarcoidosis Sister    Colon cancer Neg Hx    Colon polyps Neg Hx    Esophageal cancer Neg Hx    Rectal cancer Neg Hx    Stomach cancer Neg Hx     Past Surgical History:  Procedure Laterality Date   ABDOMINAL HYSTERECTOMY     CATARACT EXTRACTION W/PHACO Right 06/24/2014   Procedure: CATARACT EXTRACTION PHACO AND INTRAOCULAR LENS PLACEMENT ([place]) RIGHT EYE WITH GONIOSYNECHIALYSIS;  Surgeon: Marylynn Pearson, MD;  Location: [facility];  Service: Ophthalmology;  Laterality: Right;   COLONOSCOPY     EYE SURGERY Left    cataract surgery   HAND SURGERY Right    trigger finger release   IR RADIOLOGIST EVAL & MGMT  11/14/2018   KNEE ARTHROSCOPY Right    TOTAL KNEE ARTHROPLASTY Right  12/08/2021   Procedure: RIGHT TOTAL KNEE ARTHROPLASTY APPLICATION OF WOUND VAC;  Surgeon: Meredith Pel, MD;  Location: [place];  Service: Orthopedics;  Laterality: Right;   TOTAL KNEE ARTHROPLASTY Left 04/13/2022   Procedure: LEFT TOTAL KNEE ARTHROPLASTY;  Surgeon: Meredith Pel, MD;  Location: [place];  Service: Orthopedics;  Laterality: Left;   Social History   Occupational History   Not on file  Tobacco Use   Smoking status: Never   Smokeless tobacco: Never  Vaping Use   Vaping Use: Never used  Substance and Sexual Activity   Alcohol use: Yes    Comment: 1 glass of wine or mixed drink once a week   Drug use: No   Sexual activity: Not on file

## 2022-05-03 ENCOUNTER — Ambulatory Visit (INDEPENDENT_AMBULATORY_CARE_PROVIDER_SITE_OTHER): Payer: Medicare PPO | Admitting: Orthopedic Surgery

## 2022-05-03 ENCOUNTER — Encounter: Payer: Self-pay | Admitting: Orthopedic Surgery

## 2022-05-03 DIAGNOSIS — Z96652 Presence of left artificial knee joint: Secondary | ICD-10-CM

## 2022-05-03 NOTE — Progress Notes (Signed)
Post-Op Visit Note   Patient: [name]           Date of Birth: 06-27-1951           MRN: [id number] Visit Date: 05/03/2022 PCP: [name], MD   Assessment & Plan:  Chief Complaint:  Chief Complaint  Patient presents with   Left Knee - Routine Post Op   Visit Diagnoses:  1. Status post left knee replacement     Plan: Patient is a 71 year old female who presents s/p left total knee arthroplasty on 04/13/2022.  She returns for incision recheck primarily.  She has been told that she needs to discharge from her facility by Thursday but is working on appeal process.  On exam, she has 10 degrees extension and 100 degrees of knee flexion.  No effusion.  2+ DP pulse of the operative extremity.  Incision is healing well with significant improvement compared with 1 week ago.  There is no expressible drainage or surrounding erythema around the incision.  No superficial gapping.  She is able to perform straight leg raise with 10 degree extensor lag.  Plan is return in 3 weeks for clinical recheck.  Recommended she do all that she can in her power to progress with her mobility and reach out if she has concerns in the next 3 weeks.  Follow-Up Instructions: Return in about 3 weeks (around 05/24/2022).   Orders:  No orders of the defined types were placed in this encounter.  No orders of the defined types were placed in this encounter.   Imaging: No results found.  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/28/2021   Pincer nail deformity  09/13/2020   Foot pain 09/13/2020   Knee pain 08/24/2018   Fall in home 07/14/2015   TIBIALIS TENDINITIS 08/25/2009   CHEST PAIN, OTHER, PAIN 10/21/2008   PEDAL EDEMA 07/22/2008   ANKLE PAIN, BILATERAL 04/01/2008   LOW BACK PAIN 04/01/2008   ACUTE BRONCHITIS 09/23/2007   SARCOIDOSIS 09/02/2007   Asthma 09/02/2007   Diverticulosis of colon 07/11/2007   BACK PAIN, CHRONIC 07/11/2007   Past Medical History:  Diagnosis Date   Acute bronchitis    Allergy    Anxiety    Arthritis    knees   Asthma    Backache, unspecified    Cataract    removed both eyes   Depression    Diverticulitis    Diverticulosis of colon (without mention of hemorrhage)    Edema    GERD (gastroesophageal reflux disease)    Glaucoma    Lumbago    Obesity    Other chest pain    Pain in joint, ankle and foot    Sarcoidosis    Tibialis tendinitis    Unspecified asthma(493.90)     Family History  Problem Relation Age of Onset   Hypertension Mother    Stroke Mother    Diabetes Father    Sarcoidosis Sister    Colon cancer Neg Hx    Colon polyps Neg Hx  Esophageal cancer Neg Hx    Rectal cancer Neg Hx    Stomach cancer Neg Hx     Past Surgical History:  Procedure Laterality Date   ABDOMINAL HYSTERECTOMY     CATARACT EXTRACTION W/PHACO Right 06/24/2014   Procedure: CATARACT EXTRACTION PHACO AND INTRAOCULAR LENS PLACEMENT ([facility]) RIGHT EYE WITH GONIOSYNECHIALYSIS;  Surgeon: Marylynn Pearson, MD;  Location: [place];  Service: Ophthalmology;  Laterality: Right;   COLONOSCOPY     EYE SURGERY Left    cataract surgery   HAND SURGERY Right    trigger finger release   IR RADIOLOGIST EVAL & MGMT  11/14/2018   KNEE ARTHROSCOPY Right    TOTAL KNEE ARTHROPLASTY Right 12/08/2021   Procedure: RIGHT TOTAL KNEE ARTHROPLASTY APPLICATION OF WOUND VAC;  Surgeon: Meredith Pel, MD;  Location: [place];  Service: Orthopedics;  Laterality: Right;   TOTAL KNEE ARTHROPLASTY Left 04/13/2022   Procedure: LEFT TOTAL KNEE  ARTHROPLASTY;  Surgeon: Meredith Pel, MD;  Location: [place];  Service: Orthopedics;  Laterality: Left;   Social History   Occupational History   Not on file  Tobacco Use   Smoking status: Never   Smokeless tobacco: Never  Vaping Use   Vaping Use: Never used  Substance and Sexual Activity   Alcohol use: Yes    Comment: 1 glass of wine or mixed drink once a week   Drug use: No   Sexual activity: Not on file

## 2022-05-09 ENCOUNTER — Telehealth: Payer: Self-pay | Admitting: Orthopedic Surgery

## 2022-05-09 DIAGNOSIS — R52 Pain, unspecified: Secondary | ICD-10-CM | POA: Diagnosis not present

## 2022-05-09 DIAGNOSIS — R222 Localized swelling, mass and lump, trunk: Secondary | ICD-10-CM | POA: Diagnosis not present

## 2022-05-09 NOTE — Telephone Encounter (Signed)
Received call from Ann-Clinical Coordinator with [facility] advised patient asked if she can get Cortisone injections in both of her wrist when she comes to her appointment? The number to contact Lelon Frohlich is [phone]

## 2022-05-09 NOTE — Telephone Encounter (Signed)
I usually wait 6 weeks before cortisone injections after elective joint replacement in other joints

## 2022-05-10 NOTE — Telephone Encounter (Signed)
IC advised.  

## 2022-05-11 DIAGNOSIS — D649 Anemia, unspecified: Secondary | ICD-10-CM | POA: Diagnosis not present

## 2022-05-11 DIAGNOSIS — N181 Chronic kidney disease, stage 1: Secondary | ICD-10-CM | POA: Diagnosis not present

## 2022-05-11 DIAGNOSIS — M069 Rheumatoid arthritis, unspecified: Secondary | ICD-10-CM | POA: Diagnosis not present

## 2022-05-11 DIAGNOSIS — Z7689 Persons encountering health services in other specified circumstances: Secondary | ICD-10-CM | POA: Diagnosis not present

## 2022-05-11 DIAGNOSIS — D75839 Thrombocytosis, unspecified: Secondary | ICD-10-CM | POA: Diagnosis not present

## 2022-05-12 DIAGNOSIS — Z7689 Persons encountering health services in other specified circumstances: Secondary | ICD-10-CM | POA: Diagnosis not present

## 2022-05-12 DIAGNOSIS — K5900 Constipation, unspecified: Secondary | ICD-10-CM | POA: Diagnosis not present

## 2022-05-12 DIAGNOSIS — R222 Localized swelling, mass and lump, trunk: Secondary | ICD-10-CM | POA: Diagnosis not present

## 2022-05-12 DIAGNOSIS — R52 Pain, unspecified: Secondary | ICD-10-CM | POA: Diagnosis not present

## 2022-05-15 DIAGNOSIS — F4323 Adjustment disorder with mixed anxiety and depressed mood: Secondary | ICD-10-CM | POA: Diagnosis not present

## 2022-05-15 DIAGNOSIS — R278 Other lack of coordination: Secondary | ICD-10-CM | POA: Diagnosis not present

## 2022-05-15 DIAGNOSIS — Z471 Aftercare following joint replacement surgery: Secondary | ICD-10-CM | POA: Diagnosis not present

## 2022-05-15 DIAGNOSIS — R2689 Other abnormalities of gait and mobility: Secondary | ICD-10-CM | POA: Diagnosis not present

## 2022-05-15 DIAGNOSIS — M6281 Muscle weakness (generalized): Secondary | ICD-10-CM | POA: Diagnosis not present

## 2022-05-16 ENCOUNTER — Telehealth: Payer: Self-pay | Admitting: Family Medicine

## 2022-05-16 DIAGNOSIS — R278 Other lack of coordination: Secondary | ICD-10-CM | POA: Diagnosis not present

## 2022-05-16 DIAGNOSIS — R2689 Other abnormalities of gait and mobility: Secondary | ICD-10-CM | POA: Diagnosis not present

## 2022-05-16 DIAGNOSIS — M6281 Muscle weakness (generalized): Secondary | ICD-10-CM | POA: Diagnosis not present

## 2022-05-16 DIAGNOSIS — Z471 Aftercare following joint replacement surgery: Secondary | ICD-10-CM | POA: Diagnosis not present

## 2022-05-16 NOTE — Telephone Encounter (Signed)
Left message for patient to call back and schedule Medicare Annual Wellness Visit (AWV) either virtually or in office. Left  my Herbie Drape number [phone]   Last AWV ;01/19/21  please schedule at anytime with [facility] Nurse Health Advisor 1 or 2

## 2022-05-17 DIAGNOSIS — M6281 Muscle weakness (generalized): Secondary | ICD-10-CM | POA: Diagnosis not present

## 2022-05-17 DIAGNOSIS — Z471 Aftercare following joint replacement surgery: Secondary | ICD-10-CM | POA: Diagnosis not present

## 2022-05-17 DIAGNOSIS — R278 Other lack of coordination: Secondary | ICD-10-CM | POA: Diagnosis not present

## 2022-05-17 DIAGNOSIS — R2689 Other abnormalities of gait and mobility: Secondary | ICD-10-CM | POA: Diagnosis not present

## 2022-05-18 DIAGNOSIS — M6281 Muscle weakness (generalized): Secondary | ICD-10-CM | POA: Diagnosis not present

## 2022-05-18 DIAGNOSIS — R278 Other lack of coordination: Secondary | ICD-10-CM | POA: Diagnosis not present

## 2022-05-18 DIAGNOSIS — R2689 Other abnormalities of gait and mobility: Secondary | ICD-10-CM | POA: Diagnosis not present

## 2022-05-18 DIAGNOSIS — Z471 Aftercare following joint replacement surgery: Secondary | ICD-10-CM | POA: Diagnosis not present

## 2022-05-19 DIAGNOSIS — R2689 Other abnormalities of gait and mobility: Secondary | ICD-10-CM | POA: Diagnosis not present

## 2022-05-19 DIAGNOSIS — R278 Other lack of coordination: Secondary | ICD-10-CM | POA: Diagnosis not present

## 2022-05-19 DIAGNOSIS — Z471 Aftercare following joint replacement surgery: Secondary | ICD-10-CM | POA: Diagnosis not present

## 2022-05-19 DIAGNOSIS — M6281 Muscle weakness (generalized): Secondary | ICD-10-CM | POA: Diagnosis not present

## 2022-05-22 DIAGNOSIS — M6281 Muscle weakness (generalized): Secondary | ICD-10-CM | POA: Diagnosis not present

## 2022-05-22 DIAGNOSIS — Z471 Aftercare following joint replacement surgery: Secondary | ICD-10-CM | POA: Diagnosis not present

## 2022-05-22 DIAGNOSIS — R2689 Other abnormalities of gait and mobility: Secondary | ICD-10-CM | POA: Diagnosis not present

## 2022-05-22 DIAGNOSIS — F4323 Adjustment disorder with mixed anxiety and depressed mood: Secondary | ICD-10-CM | POA: Diagnosis not present

## 2022-05-22 DIAGNOSIS — F331 Major depressive disorder, recurrent, moderate: Secondary | ICD-10-CM | POA: Diagnosis not present

## 2022-05-22 DIAGNOSIS — R278 Other lack of coordination: Secondary | ICD-10-CM | POA: Diagnosis not present

## 2022-05-23 DIAGNOSIS — Z9181 History of falling: Secondary | ICD-10-CM | POA: Diagnosis not present

## 2022-05-23 DIAGNOSIS — M069 Rheumatoid arthritis, unspecified: Secondary | ICD-10-CM | POA: Diagnosis not present

## 2022-05-23 DIAGNOSIS — R2689 Other abnormalities of gait and mobility: Secondary | ICD-10-CM | POA: Diagnosis not present

## 2022-05-23 DIAGNOSIS — Z471 Aftercare following joint replacement surgery: Secondary | ICD-10-CM | POA: Diagnosis not present

## 2022-05-23 DIAGNOSIS — M6281 Muscle weakness (generalized): Secondary | ICD-10-CM | POA: Diagnosis not present

## 2022-05-23 DIAGNOSIS — R278 Other lack of coordination: Secondary | ICD-10-CM | POA: Diagnosis not present

## 2022-05-24 ENCOUNTER — Encounter: Payer: Medicare PPO | Admitting: Orthopedic Surgery

## 2022-05-24 DIAGNOSIS — R2689 Other abnormalities of gait and mobility: Secondary | ICD-10-CM | POA: Diagnosis not present

## 2022-05-24 DIAGNOSIS — Z471 Aftercare following joint replacement surgery: Secondary | ICD-10-CM | POA: Diagnosis not present

## 2022-05-24 DIAGNOSIS — M6281 Muscle weakness (generalized): Secondary | ICD-10-CM | POA: Diagnosis not present

## 2022-05-24 DIAGNOSIS — R278 Other lack of coordination: Secondary | ICD-10-CM | POA: Diagnosis not present

## 2022-05-25 DIAGNOSIS — M6281 Muscle weakness (generalized): Secondary | ICD-10-CM | POA: Diagnosis not present

## 2022-05-25 DIAGNOSIS — R2689 Other abnormalities of gait and mobility: Secondary | ICD-10-CM | POA: Diagnosis not present

## 2022-05-25 DIAGNOSIS — R278 Other lack of coordination: Secondary | ICD-10-CM | POA: Diagnosis not present

## 2022-05-25 DIAGNOSIS — Z471 Aftercare following joint replacement surgery: Secondary | ICD-10-CM | POA: Diagnosis not present

## 2022-05-26 DIAGNOSIS — R278 Other lack of coordination: Secondary | ICD-10-CM | POA: Diagnosis not present

## 2022-05-26 DIAGNOSIS — R2689 Other abnormalities of gait and mobility: Secondary | ICD-10-CM | POA: Diagnosis not present

## 2022-05-26 DIAGNOSIS — Z471 Aftercare following joint replacement surgery: Secondary | ICD-10-CM | POA: Diagnosis not present

## 2022-05-26 DIAGNOSIS — M6281 Muscle weakness (generalized): Secondary | ICD-10-CM | POA: Diagnosis not present

## 2022-05-31 ENCOUNTER — Other Ambulatory Visit (HOSPITAL_COMMUNITY): Payer: Self-pay | Admitting: Rheumatology

## 2022-05-31 ENCOUNTER — Other Ambulatory Visit: Payer: Self-pay | Admitting: Rheumatology

## 2022-05-31 DIAGNOSIS — M2550 Pain in unspecified joint: Secondary | ICD-10-CM | POA: Diagnosis not present

## 2022-05-31 DIAGNOSIS — M79642 Pain in left hand: Secondary | ICD-10-CM | POA: Diagnosis not present

## 2022-05-31 DIAGNOSIS — M0609 Rheumatoid arthritis without rheumatoid factor, multiple sites: Secondary | ICD-10-CM | POA: Diagnosis not present

## 2022-05-31 DIAGNOSIS — M1990 Unspecified osteoarthritis, unspecified site: Secondary | ICD-10-CM | POA: Diagnosis not present

## 2022-05-31 DIAGNOSIS — M79641 Pain in right hand: Secondary | ICD-10-CM | POA: Diagnosis not present

## 2022-05-31 DIAGNOSIS — Z862 Personal history of diseases of the blood and blood-forming organs and certain disorders involving the immune mechanism: Secondary | ICD-10-CM | POA: Diagnosis not present

## 2022-05-31 DIAGNOSIS — M130 Polyarthritis, unspecified: Secondary | ICD-10-CM | POA: Diagnosis not present

## 2022-05-31 DIAGNOSIS — R700 Elevated erythrocyte sedimentation rate: Secondary | ICD-10-CM | POA: Diagnosis not present

## 2022-06-02 ENCOUNTER — Encounter: Payer: Self-pay | Admitting: Surgical

## 2022-06-02 ENCOUNTER — Ambulatory Visit (HOSPITAL_COMMUNITY)
Admission: RE | Admit: 2022-06-02 | Discharge: 2022-06-02 | Disposition: A | Discharge status: Home / Self Care | Payer: Medicare PPO | Source: Ambulatory Visit | Attending: Rheumatology | Admitting: Rheumatology

## 2022-06-02 DIAGNOSIS — M4316 Spondylolisthesis, lumbar region: Secondary | ICD-10-CM | POA: Diagnosis not present

## 2022-06-02 DIAGNOSIS — M19012 Primary osteoarthritis, left shoulder: Secondary | ICD-10-CM | POA: Diagnosis not present

## 2022-06-02 DIAGNOSIS — M2550 Pain in unspecified joint: Secondary | ICD-10-CM | POA: Insufficient documentation

## 2022-06-02 DIAGNOSIS — I700 Atherosclerosis of aorta: Secondary | ICD-10-CM | POA: Diagnosis not present

## 2022-06-02 DIAGNOSIS — M40204 Unspecified kyphosis, thoracic region: Secondary | ICD-10-CM | POA: Diagnosis not present

## 2022-06-02 DIAGNOSIS — M5134 Other intervertebral disc degeneration, thoracic region: Secondary | ICD-10-CM | POA: Diagnosis not present

## 2022-06-02 DIAGNOSIS — M19011 Primary osteoarthritis, right shoulder: Secondary | ICD-10-CM | POA: Diagnosis not present

## 2022-06-02 DIAGNOSIS — M545 Low back pain, unspecified: Secondary | ICD-10-CM | POA: Diagnosis not present

## 2022-06-02 DIAGNOSIS — M2578 Osteophyte, vertebrae: Secondary | ICD-10-CM | POA: Diagnosis not present

## 2022-06-06 DIAGNOSIS — M179 Osteoarthritis of knee, unspecified: Secondary | ICD-10-CM | POA: Diagnosis not present

## 2022-06-06 DIAGNOSIS — Z7389 Other problems related to life management difficulty: Secondary | ICD-10-CM | POA: Diagnosis not present

## 2022-06-06 DIAGNOSIS — M6281 Muscle weakness (generalized): Secondary | ICD-10-CM | POA: Diagnosis not present

## 2022-06-06 DIAGNOSIS — R269 Unspecified abnormalities of gait and mobility: Secondary | ICD-10-CM | POA: Diagnosis not present

## 2022-06-06 DIAGNOSIS — Z471 Aftercare following joint replacement surgery: Secondary | ICD-10-CM | POA: Diagnosis not present

## 2022-06-06 DIAGNOSIS — M25569 Pain in unspecified knee: Secondary | ICD-10-CM | POA: Diagnosis not present

## 2022-06-06 DIAGNOSIS — M545 Low back pain, unspecified: Secondary | ICD-10-CM | POA: Diagnosis not present

## 2022-06-06 DIAGNOSIS — R278 Other lack of coordination: Secondary | ICD-10-CM | POA: Diagnosis not present

## 2022-06-06 DIAGNOSIS — R2689 Other abnormalities of gait and mobility: Secondary | ICD-10-CM | POA: Diagnosis not present

## 2022-06-07 ENCOUNTER — Encounter: Payer: Self-pay | Admitting: Orthopedic Surgery

## 2022-06-07 ENCOUNTER — Ambulatory Visit (INDEPENDENT_AMBULATORY_CARE_PROVIDER_SITE_OTHER): Payer: Self-pay | Admitting: Orthopedic Surgery

## 2022-06-07 DIAGNOSIS — Z96652 Presence of left artificial knee joint: Secondary | ICD-10-CM

## 2022-06-07 NOTE — Progress Notes (Signed)
Post-Op Visit Note   Patient: [name]           Date of Birth: 1951/09/25           MRN: [id number] Visit Date: 06/07/2022 PCP: [name], MD   Assessment & Plan:  Chief Complaint:  Chief Complaint  Patient presents with   Left Knee - Routine Post Op    left total knee arthroplasty on 04/13/2022   Visit Diagnoses:  1. Status post left knee replacement     Plan: 1 is a 71 year old patient who underwent left total knee replacement 04/13/2022.  Doing reasonably well.  Feels like she has improved some.  Doing physical therapy at [facility].  She does have rheumatoid arthritis and currently is on prednisone.  Hard for her to do more with her hands.  Also is reporting bilateral ankle pain.  Has a known history of right carpal tunnel syndrome but is also having some right and left hand and arm symptoms at this time.  On examination she has about a 10 degree flexion contracture on the right 12 to 13 degrees on the left.  No effusion of either knee.  Does have some ankle synovitis and swelling bilaterally with intact here to give history. Phyllis tendon function.  Bilateral hands are examined.  She does have significant synovitis in the MCP joints as well as bilateral wrist joints.  Diminished functional use of the hand due to loss of dexterity in the fingers.  Plan at this time is work on achieving full left leg extension by working on manual therapy on her phone which is demonstrated in terms of pushing down the knee with the heel on a stool.  Okay for weightbearing as tolerated but in general she needs another level of control of her rheumatoid arthritis in order to facilitate grip strength as well as ambulation so that she can walk more on her ankles.  Follow-up in 3 months for lower final check  Follow-Up Instructions: Return in about 3 months (around 09/06/2022).   Orders:  No orders of the defined types were placed in this encounter.  No orders of the defined types  were placed in this encounter.   Imaging: No results found.  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/28/2021   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   Foot pain 09/13/2020   Knee pain 08/24/2018   Fall in home 07/14/2015   TIBIALIS TENDINITIS 08/25/2009   CHEST PAIN, OTHER, PAIN 10/21/2008   PEDAL EDEMA 07/22/2008   ANKLE PAIN, BILATERAL 04/01/2008   LOW BACK PAIN 04/01/2008   ACUTE BRONCHITIS 09/23/2007   SARCOIDOSIS 09/02/2007   Asthma 09/02/2007   Diverticulosis of colon 07/11/2007   BACK PAIN, CHRONIC 07/11/2007   Past Medical History:  Diagnosis Date   Acute bronchitis    Allergy    Anxiety    Arthritis    knees   Asthma    Backache, unspecified    Cataract    removed both eyes   Depression    Diverticulitis  Diverticulosis of colon (without mention of hemorrhage)    Edema    GERD (gastroesophageal reflux disease)    Glaucoma    Lumbago    Obesity    Other chest pain    Pain in joint, ankle and foot    Sarcoidosis    Tibialis tendinitis    Unspecified asthma(493.90)     Family History  Problem Relation Age of Onset   Hypertension Mother    Stroke Mother    Diabetes Father    Sarcoidosis Sister    Colon cancer Neg Hx    Colon polyps Neg Hx    Esophageal cancer Neg Hx    Rectal cancer Neg Hx    Stomach cancer Neg Hx     Past Surgical History:  Procedure Laterality Date   ABDOMINAL HYSTERECTOMY     CATARACT EXTRACTION W/PHACO Right 06/24/2014   Procedure: CATARACT EXTRACTION PHACO AND INTRAOCULAR LENS  PLACEMENT (IOC) RIGHT EYE WITH GONIOSYNECHIALYSIS;  Surgeon: Marylynn Pearson, MD;  Location: [place] [facility];  Service: Ophthalmology;  Laterality: Right;   COLONOSCOPY     EYE SURGERY Left    cataract surgery   HAND SURGERY Right    trigger finger release   IR RADIOLOGIST EVAL & MGMT  11/14/2018   KNEE ARTHROSCOPY Right    TOTAL KNEE ARTHROPLASTY Right 12/08/2021   Procedure: RIGHT TOTAL KNEE ARTHROPLASTY APPLICATION OF WOUND VAC;  Surgeon: Meredith Pel, MD;  Location: [place];  Service: Orthopedics;  Laterality: Right;   TOTAL KNEE ARTHROPLASTY Left 04/13/2022   Procedure: LEFT TOTAL KNEE ARTHROPLASTY;  Surgeon: Meredith Pel, MD;  Location: [place];  Service: Orthopedics;  Laterality: Left;   Social History   Occupational History   Not on file  Tobacco Use   Smoking status: Never   Smokeless tobacco: Never  Vaping Use   Vaping Use: Never used  Substance and Sexual Activity   Alcohol use: Yes    Comment: 1 glass of wine or mixed drink once a week   Drug use: No   Sexual activity: Not on file

## 2022-06-08 DIAGNOSIS — Z7389 Other problems related to life management difficulty: Secondary | ICD-10-CM | POA: Diagnosis not present

## 2022-06-08 DIAGNOSIS — R278 Other lack of coordination: Secondary | ICD-10-CM | POA: Diagnosis not present

## 2022-06-08 DIAGNOSIS — M179 Osteoarthritis of knee, unspecified: Secondary | ICD-10-CM | POA: Diagnosis not present

## 2022-06-08 DIAGNOSIS — Z471 Aftercare following joint replacement surgery: Secondary | ICD-10-CM | POA: Diagnosis not present

## 2022-06-08 DIAGNOSIS — M25569 Pain in unspecified knee: Secondary | ICD-10-CM | POA: Diagnosis not present

## 2022-06-08 DIAGNOSIS — R269 Unspecified abnormalities of gait and mobility: Secondary | ICD-10-CM | POA: Diagnosis not present

## 2022-06-08 DIAGNOSIS — M545 Low back pain, unspecified: Secondary | ICD-10-CM | POA: Diagnosis not present

## 2022-06-08 DIAGNOSIS — R2689 Other abnormalities of gait and mobility: Secondary | ICD-10-CM | POA: Diagnosis not present

## 2022-06-08 DIAGNOSIS — M6281 Muscle weakness (generalized): Secondary | ICD-10-CM | POA: Diagnosis not present

## 2022-06-09 DIAGNOSIS — Z471 Aftercare following joint replacement surgery: Secondary | ICD-10-CM | POA: Diagnosis not present

## 2022-06-09 DIAGNOSIS — R278 Other lack of coordination: Secondary | ICD-10-CM | POA: Diagnosis not present

## 2022-06-09 DIAGNOSIS — Z7389 Other problems related to life management difficulty: Secondary | ICD-10-CM | POA: Diagnosis not present

## 2022-06-09 DIAGNOSIS — R2689 Other abnormalities of gait and mobility: Secondary | ICD-10-CM | POA: Diagnosis not present

## 2022-06-09 DIAGNOSIS — M25569 Pain in unspecified knee: Secondary | ICD-10-CM | POA: Diagnosis not present

## 2022-06-09 DIAGNOSIS — M545 Low back pain, unspecified: Secondary | ICD-10-CM | POA: Diagnosis not present

## 2022-06-09 DIAGNOSIS — M6281 Muscle weakness (generalized): Secondary | ICD-10-CM | POA: Diagnosis not present

## 2022-06-09 DIAGNOSIS — R269 Unspecified abnormalities of gait and mobility: Secondary | ICD-10-CM | POA: Diagnosis not present

## 2022-06-09 DIAGNOSIS — M179 Osteoarthritis of knee, unspecified: Secondary | ICD-10-CM | POA: Diagnosis not present

## 2022-06-12 DIAGNOSIS — M25569 Pain in unspecified knee: Secondary | ICD-10-CM | POA: Diagnosis not present

## 2022-06-12 DIAGNOSIS — R269 Unspecified abnormalities of gait and mobility: Secondary | ICD-10-CM | POA: Diagnosis not present

## 2022-06-12 DIAGNOSIS — M179 Osteoarthritis of knee, unspecified: Secondary | ICD-10-CM | POA: Diagnosis not present

## 2022-06-12 DIAGNOSIS — Z7389 Other problems related to life management difficulty: Secondary | ICD-10-CM | POA: Diagnosis not present

## 2022-06-12 DIAGNOSIS — R278 Other lack of coordination: Secondary | ICD-10-CM | POA: Diagnosis not present

## 2022-06-12 DIAGNOSIS — R2689 Other abnormalities of gait and mobility: Secondary | ICD-10-CM | POA: Diagnosis not present

## 2022-06-12 DIAGNOSIS — M6281 Muscle weakness (generalized): Secondary | ICD-10-CM | POA: Diagnosis not present

## 2022-06-12 DIAGNOSIS — M545 Low back pain, unspecified: Secondary | ICD-10-CM | POA: Diagnosis not present

## 2022-06-12 DIAGNOSIS — Z471 Aftercare following joint replacement surgery: Secondary | ICD-10-CM | POA: Diagnosis not present

## 2022-06-13 ENCOUNTER — Telehealth: Payer: Self-pay | Admitting: Family Medicine

## 2022-06-13 DIAGNOSIS — M545 Low back pain, unspecified: Secondary | ICD-10-CM | POA: Diagnosis not present

## 2022-06-13 DIAGNOSIS — Z7389 Other problems related to life management difficulty: Secondary | ICD-10-CM | POA: Diagnosis not present

## 2022-06-13 DIAGNOSIS — R2689 Other abnormalities of gait and mobility: Secondary | ICD-10-CM | POA: Diagnosis not present

## 2022-06-13 DIAGNOSIS — R278 Other lack of coordination: Secondary | ICD-10-CM | POA: Diagnosis not present

## 2022-06-13 DIAGNOSIS — M6281 Muscle weakness (generalized): Secondary | ICD-10-CM | POA: Diagnosis not present

## 2022-06-13 DIAGNOSIS — M179 Osteoarthritis of knee, unspecified: Secondary | ICD-10-CM | POA: Diagnosis not present

## 2022-06-13 DIAGNOSIS — M25569 Pain in unspecified knee: Secondary | ICD-10-CM | POA: Diagnosis not present

## 2022-06-13 DIAGNOSIS — Z471 Aftercare following joint replacement surgery: Secondary | ICD-10-CM | POA: Diagnosis not present

## 2022-06-13 DIAGNOSIS — R269 Unspecified abnormalities of gait and mobility: Secondary | ICD-10-CM | POA: Diagnosis not present

## 2022-06-13 NOTE — Telephone Encounter (Signed)
Left message for patient to call back and schedule Medicare Annual Wellness Visit (AWV) either virtually or in office. Left  my Herbie Drape number [phone]   Last AWV 01/19/21 ; please schedule at anytime with [facility] Nurse Health Advisor 1 or 2

## 2022-06-14 DIAGNOSIS — M25569 Pain in unspecified knee: Secondary | ICD-10-CM | POA: Diagnosis not present

## 2022-06-14 DIAGNOSIS — Z7389 Other problems related to life management difficulty: Secondary | ICD-10-CM | POA: Diagnosis not present

## 2022-06-14 DIAGNOSIS — Z471 Aftercare following joint replacement surgery: Secondary | ICD-10-CM | POA: Diagnosis not present

## 2022-06-14 DIAGNOSIS — M179 Osteoarthritis of knee, unspecified: Secondary | ICD-10-CM | POA: Diagnosis not present

## 2022-06-14 DIAGNOSIS — R2689 Other abnormalities of gait and mobility: Secondary | ICD-10-CM | POA: Diagnosis not present

## 2022-06-14 DIAGNOSIS — R269 Unspecified abnormalities of gait and mobility: Secondary | ICD-10-CM | POA: Diagnosis not present

## 2022-06-14 DIAGNOSIS — M6281 Muscle weakness (generalized): Secondary | ICD-10-CM | POA: Diagnosis not present

## 2022-06-14 DIAGNOSIS — M545 Low back pain, unspecified: Secondary | ICD-10-CM | POA: Diagnosis not present

## 2022-06-14 DIAGNOSIS — R278 Other lack of coordination: Secondary | ICD-10-CM | POA: Diagnosis not present

## 2022-06-15 DIAGNOSIS — Z471 Aftercare following joint replacement surgery: Secondary | ICD-10-CM | POA: Diagnosis not present

## 2022-06-15 DIAGNOSIS — M6281 Muscle weakness (generalized): Secondary | ICD-10-CM | POA: Diagnosis not present

## 2022-06-15 DIAGNOSIS — M545 Low back pain, unspecified: Secondary | ICD-10-CM | POA: Diagnosis not present

## 2022-06-15 DIAGNOSIS — R278 Other lack of coordination: Secondary | ICD-10-CM | POA: Diagnosis not present

## 2022-06-15 DIAGNOSIS — Z7389 Other problems related to life management difficulty: Secondary | ICD-10-CM | POA: Diagnosis not present

## 2022-06-15 DIAGNOSIS — M179 Osteoarthritis of knee, unspecified: Secondary | ICD-10-CM | POA: Diagnosis not present

## 2022-06-15 DIAGNOSIS — M25569 Pain in unspecified knee: Secondary | ICD-10-CM | POA: Diagnosis not present

## 2022-06-15 DIAGNOSIS — R2689 Other abnormalities of gait and mobility: Secondary | ICD-10-CM | POA: Diagnosis not present

## 2022-06-15 DIAGNOSIS — R269 Unspecified abnormalities of gait and mobility: Secondary | ICD-10-CM | POA: Diagnosis not present

## 2022-06-16 DIAGNOSIS — M792 Neuralgia and neuritis, unspecified: Secondary | ICD-10-CM | POA: Diagnosis not present

## 2022-06-16 DIAGNOSIS — B351 Tinea unguium: Secondary | ICD-10-CM | POA: Diagnosis not present

## 2022-06-16 DIAGNOSIS — M19072 Primary osteoarthritis, left ankle and foot: Secondary | ICD-10-CM | POA: Diagnosis not present

## 2022-06-16 DIAGNOSIS — M19071 Primary osteoarthritis, right ankle and foot: Secondary | ICD-10-CM | POA: Diagnosis not present

## 2022-06-16 DIAGNOSIS — I739 Peripheral vascular disease, unspecified: Secondary | ICD-10-CM | POA: Diagnosis not present

## 2022-06-19 DIAGNOSIS — M179 Osteoarthritis of knee, unspecified: Secondary | ICD-10-CM | POA: Diagnosis not present

## 2022-06-19 DIAGNOSIS — M25569 Pain in unspecified knee: Secondary | ICD-10-CM | POA: Diagnosis not present

## 2022-06-19 DIAGNOSIS — Z471 Aftercare following joint replacement surgery: Secondary | ICD-10-CM | POA: Diagnosis not present

## 2022-06-19 DIAGNOSIS — R2689 Other abnormalities of gait and mobility: Secondary | ICD-10-CM | POA: Diagnosis not present

## 2022-06-19 DIAGNOSIS — M6281 Muscle weakness (generalized): Secondary | ICD-10-CM | POA: Diagnosis not present

## 2022-06-19 DIAGNOSIS — M545 Low back pain, unspecified: Secondary | ICD-10-CM | POA: Diagnosis not present

## 2022-06-19 DIAGNOSIS — R278 Other lack of coordination: Secondary | ICD-10-CM | POA: Diagnosis not present

## 2022-06-19 DIAGNOSIS — R269 Unspecified abnormalities of gait and mobility: Secondary | ICD-10-CM | POA: Diagnosis not present

## 2022-06-19 DIAGNOSIS — Z7389 Other problems related to life management difficulty: Secondary | ICD-10-CM | POA: Diagnosis not present

## 2022-06-20 DIAGNOSIS — Z7389 Other problems related to life management difficulty: Secondary | ICD-10-CM | POA: Diagnosis not present

## 2022-06-20 DIAGNOSIS — R269 Unspecified abnormalities of gait and mobility: Secondary | ICD-10-CM | POA: Diagnosis not present

## 2022-06-20 DIAGNOSIS — R2689 Other abnormalities of gait and mobility: Secondary | ICD-10-CM | POA: Diagnosis not present

## 2022-06-20 DIAGNOSIS — M6281 Muscle weakness (generalized): Secondary | ICD-10-CM | POA: Diagnosis not present

## 2022-06-20 DIAGNOSIS — M179 Osteoarthritis of knee, unspecified: Secondary | ICD-10-CM | POA: Diagnosis not present

## 2022-06-20 DIAGNOSIS — R278 Other lack of coordination: Secondary | ICD-10-CM | POA: Diagnosis not present

## 2022-06-20 DIAGNOSIS — Z471 Aftercare following joint replacement surgery: Secondary | ICD-10-CM | POA: Diagnosis not present

## 2022-06-20 DIAGNOSIS — M25569 Pain in unspecified knee: Secondary | ICD-10-CM | POA: Diagnosis not present

## 2022-06-20 DIAGNOSIS — M545 Low back pain, unspecified: Secondary | ICD-10-CM | POA: Diagnosis not present

## 2022-06-21 DIAGNOSIS — R269 Unspecified abnormalities of gait and mobility: Secondary | ICD-10-CM | POA: Diagnosis not present

## 2022-06-21 DIAGNOSIS — M25569 Pain in unspecified knee: Secondary | ICD-10-CM | POA: Diagnosis not present

## 2022-06-21 DIAGNOSIS — M179 Osteoarthritis of knee, unspecified: Secondary | ICD-10-CM | POA: Diagnosis not present

## 2022-06-21 DIAGNOSIS — Z7389 Other problems related to life management difficulty: Secondary | ICD-10-CM | POA: Diagnosis not present

## 2022-06-21 DIAGNOSIS — Z471 Aftercare following joint replacement surgery: Secondary | ICD-10-CM | POA: Diagnosis not present

## 2022-06-21 DIAGNOSIS — M545 Low back pain, unspecified: Secondary | ICD-10-CM | POA: Diagnosis not present

## 2022-06-21 DIAGNOSIS — M6281 Muscle weakness (generalized): Secondary | ICD-10-CM | POA: Diagnosis not present

## 2022-06-21 DIAGNOSIS — R278 Other lack of coordination: Secondary | ICD-10-CM | POA: Diagnosis not present

## 2022-06-21 DIAGNOSIS — R2689 Other abnormalities of gait and mobility: Secondary | ICD-10-CM | POA: Diagnosis not present

## 2022-06-22 DIAGNOSIS — R269 Unspecified abnormalities of gait and mobility: Secondary | ICD-10-CM | POA: Diagnosis not present

## 2022-06-22 DIAGNOSIS — M545 Low back pain, unspecified: Secondary | ICD-10-CM | POA: Diagnosis not present

## 2022-06-22 DIAGNOSIS — Z7389 Other problems related to life management difficulty: Secondary | ICD-10-CM | POA: Diagnosis not present

## 2022-06-22 DIAGNOSIS — R2689 Other abnormalities of gait and mobility: Secondary | ICD-10-CM | POA: Diagnosis not present

## 2022-06-22 DIAGNOSIS — R278 Other lack of coordination: Secondary | ICD-10-CM | POA: Diagnosis not present

## 2022-06-22 DIAGNOSIS — Z471 Aftercare following joint replacement surgery: Secondary | ICD-10-CM | POA: Diagnosis not present

## 2022-06-22 DIAGNOSIS — M179 Osteoarthritis of knee, unspecified: Secondary | ICD-10-CM | POA: Diagnosis not present

## 2022-06-22 DIAGNOSIS — M25569 Pain in unspecified knee: Secondary | ICD-10-CM | POA: Diagnosis not present

## 2022-06-22 DIAGNOSIS — M6281 Muscle weakness (generalized): Secondary | ICD-10-CM | POA: Diagnosis not present

## 2022-06-23 DIAGNOSIS — R278 Other lack of coordination: Secondary | ICD-10-CM | POA: Diagnosis not present

## 2022-06-23 DIAGNOSIS — M179 Osteoarthritis of knee, unspecified: Secondary | ICD-10-CM | POA: Diagnosis not present

## 2022-06-23 DIAGNOSIS — R2689 Other abnormalities of gait and mobility: Secondary | ICD-10-CM | POA: Diagnosis not present

## 2022-06-23 DIAGNOSIS — Z471 Aftercare following joint replacement surgery: Secondary | ICD-10-CM | POA: Diagnosis not present

## 2022-06-23 DIAGNOSIS — Z7389 Other problems related to life management difficulty: Secondary | ICD-10-CM | POA: Diagnosis not present

## 2022-06-23 DIAGNOSIS — M25569 Pain in unspecified knee: Secondary | ICD-10-CM | POA: Diagnosis not present

## 2022-06-23 DIAGNOSIS — M545 Low back pain, unspecified: Secondary | ICD-10-CM | POA: Diagnosis not present

## 2022-06-23 DIAGNOSIS — R269 Unspecified abnormalities of gait and mobility: Secondary | ICD-10-CM | POA: Diagnosis not present

## 2022-06-23 DIAGNOSIS — M6281 Muscle weakness (generalized): Secondary | ICD-10-CM | POA: Diagnosis not present

## 2022-06-26 ENCOUNTER — Ambulatory Visit (INDEPENDENT_AMBULATORY_CARE_PROVIDER_SITE_OTHER): Payer: Medicare PPO | Admitting: Family Medicine

## 2022-06-26 VITALS — BP 118/70 | HR 84 | Temp 98.6°F

## 2022-06-26 DIAGNOSIS — Z96653 Presence of artificial knee joint, bilateral: Secondary | ICD-10-CM

## 2022-06-26 DIAGNOSIS — J4530 Mild persistent asthma, uncomplicated: Secondary | ICD-10-CM

## 2022-06-26 DIAGNOSIS — Z0000 Encounter for general adult medical examination without abnormal findings: Secondary | ICD-10-CM | POA: Diagnosis not present

## 2022-06-26 DIAGNOSIS — M059 Rheumatoid arthritis with rheumatoid factor, unspecified: Secondary | ICD-10-CM

## 2022-06-26 DIAGNOSIS — J3089 Other allergic rhinitis: Secondary | ICD-10-CM

## 2022-06-26 DIAGNOSIS — Z23 Encounter for immunization: Secondary | ICD-10-CM

## 2022-06-26 DIAGNOSIS — G5603 Carpal tunnel syndrome, bilateral upper limbs: Secondary | ICD-10-CM | POA: Diagnosis not present

## 2022-06-26 LAB — COMPREHENSIVE METABOLIC PANEL
ALT: 10 U/L (ref 0–35)
AST: 10 U/L (ref 0–37)
Albumin: 3.6 g/dL (ref 3.5–5.2)
Alkaline Phosphatase: 101 U/L (ref 39–117)
BUN: 10 mg/dL (ref 6–23)
CO2: 27 mEq/L (ref 19–32)
Calcium: 8.9 mg/dL (ref 8.4–10.5)
Chloride: 104 mEq/L (ref 96–112)
Creatinine, Ser: 0.42 mg/dL (ref 0.40–1.20)
GFR: 97.22 mL/min (ref >60.00)
Glucose, Bld: 93 mg/dL (ref 70–99)
Potassium: 4.1 mEq/L (ref 3.5–5.1)
Sodium: 140 mEq/L (ref 135–145)
Total Bilirubin: 0.3 mg/dL (ref 0.2–1.2)
Total Protein: 7 g/dL (ref 6.0–8.3)

## 2022-06-26 LAB — CBC WITH DIFFERENTIAL/PLATELET
Basophils Absolute: 0 10*3/uL (ref 0.0–0.1)
Basophils Relative: 0.4 % (ref 0.0–3.0)
Eosinophils Absolute: 0.2 10*3/uL (ref 0.0–0.7)
Eosinophils Relative: 2.5 % (ref 0.0–5.0)
HCT: 34.6 % — ABNORMAL LOW (ref 36.0–46.0)
Hemoglobin: 11.2 g/dL — ABNORMAL LOW (ref 12.0–15.0)
Lymphocytes Relative: 13.8 % (ref 12.0–46.0)
Lymphs Abs: 1.3 10*3/uL (ref 0.7–4.0)
MCHC: 32.3 g/dL (ref 30.0–36.0)
MCV: 91.3 fl (ref 78.0–100.0)
Monocytes Absolute: 0.5 10*3/uL (ref 0.1–1.0)
Monocytes Relative: 5.3 % (ref 3.0–12.0)
Neutro Abs: 7.5 10*3/uL (ref 1.4–7.7)
Neutrophils Relative %: 78 % — ABNORMAL HIGH (ref 43.0–77.0)
Platelets: 364 10*3/uL (ref 150.0–400.0)
RBC: 3.79 Mil/uL — ABNORMAL LOW (ref 3.87–5.11)
RDW: 19 % — ABNORMAL HIGH (ref 11.5–15.5)
WBC: 9.7 10*3/uL (ref 4.0–10.5)

## 2022-06-26 LAB — LIPID PANEL
Cholesterol: 201 mg/dL — ABNORMAL HIGH (ref 0–200)
HDL: 67.5 mg/dL (ref >39.00)
LDL Cholesterol: 111 mg/dL — ABNORMAL HIGH (ref 0–99)
NonHDL: 133.84
Total CHOL/HDL Ratio: 3
Triglycerides: 116 mg/dL (ref 0.0–149.0)
VLDL: 23.2 mg/dL (ref 0.0–40.0)

## 2022-06-26 LAB — TSH: TSH: 1.03 u[IU]/mL (ref 0.35–5.50)

## 2022-06-26 LAB — T4, FREE: Free T4: 0.9 ng/dL (ref 0.60–1.60)

## 2022-06-26 LAB — VITAMIN B12: Vitamin B-12: 221 pg/mL (ref 211–911)

## 2022-06-26 MED ORDER — ALBUTEROL SULFATE HFA 108 (90 BASE) MCG/ACT IN AERS: INHALATION_SPRAY | RESPIRATORY_TRACT | 3 refills | Status: DC

## 2022-06-26 MED ORDER — SALINE NASAL SPRAY 0.65 % NA SOLN: 1.0000 | NASAL | 12 refills | Status: DC | PRN

## 2022-06-26 NOTE — Progress Notes (Signed)
Subjective:     [name] is a 71 y.o. female with pmh sig for OA b/l knees s/p TKR, RA, GERD, obesity, allergies, asthma, depression, PVD, diverticulitis, glaucoma, sarcoidosis who was seen for comprehensive physical exam and f/u.  On methotrexate x3 weeks for recent diagnosis of rheumatoid arthritis in bilateral hands.  Patient inquires if anything can be done to reverse deformity of fingers.  Seen by Ortho for joint pain.  Advised b/l wrist pain 2/2 carpal tunnel.    Patient currently a resident at [facility].  States not drinking that much water daily b/c it is not provided, however pt does admit she has not been asking for it.    Pt notes increased allergy symptoms.  In the past tried Human resources officer. Xyzal not as helpful.  Social History   Socioeconomic History   Marital status: Widowed    Spouse name: Not on file   Number of children: 0   Years of education: Not on file   Highest education level: Professional school degree (e.g., MD, DDS, DVM, JD)  Occupational History   Not on file  Tobacco Use   Smoking status: Never   Smokeless tobacco: Never  Vaping Use   Vaping Use: Never used  Substance and Sexual Activity   Alcohol use: Yes    Comment: 1 glass of wine or mixed drink once a week   Drug use: No   Sexual activity: Not on file  Other Topics Concern   Not on file  Social History Narrative   Not on file   Social Determinants of Health   Financial Resource Strain: Low Risk  (01/19/2021)   Overall Financial Resource Strain (CARDIA)    Difficulty of Paying Living Expenses: Not hard at all  Food Insecurity: No Food Insecurity (01/19/2021)   Hunger Vital Sign    Worried About Running Out of Food in the Last Year: Never true    Ran Out of Food in the Last Year: Never true  Transportation Needs: No Transportation Needs (07/31/2021)   PRAPARE - Hydrologist (Medical): No    Lack of Transportation (Non-Medical): No  Physical Activity:  Insufficiently Active (07/31/2021)   Exercise Vital Sign    Days of Exercise per Week: 4 days    Minutes of Exercise per Session: 20 min  Stress: Stress Concern Present (07/31/2021)   [place]    Feeling of Stress : To some extent  Social Connections: Moderately Integrated (07/31/2021)   Social Connection and Isolation Panel [NHANES]    Frequency of Communication with Friends and Family: More than three times a week    Frequency of Social Gatherings with Friends and Family: Patient refused    Attends Religious Services: More than 4 times per year    Active Member of [employer] or Organizations: Yes    Attends Archivist Meetings: More than 4 times per year    Marital Status: Widowed  Intimate Partner Violence: Not At Risk (01/19/2021)   Humiliation, Afraid, Rape, and Kick questionnaire    Fear of Current or Ex-Partner: No    Emotionally Abused: No    Physically Abused: No    Sexually Abused: No   Health Maintenance  Topic Date Due   COVID-19 Vaccine (5 - Pfizer series) 02/25/2021   INFLUENZA VACCINE  05/02/2022   MAMMOGRAM  01/06/2023   TETANUS/TDAP  09/11/2026   COLONOSCOPY (Pts 45-38yr Insurance coverage will need to be confirmed)  10/02/2029   Pneumonia Vaccine 63+ Years old  Completed   DEXA SCAN  Completed   Hepatitis C Screening  Completed   Zoster Vaccines- Shingrix  Completed   HPV VACCINES  Aged Out    The following portions of the patient's history were reviewed and updated as appropriate: allergies, current medications, past family history, past medical history, past social history, past surgical history, and problem list.  Review of Systems Pertinent items noted in HPI and remainder of comprehensive ROS otherwise negative.   Objective:    BP 118/70 (BP Location: Left Arm, Patient Position: Sitting, Cuff Size: Normal)   Pulse 84   Temp 98.6 F (37 C) (Oral)   SpO2 94%  General  appearance: alert, cooperative, and no distress Head: Normocephalic, without obvious abnormality, atraumatic Eyes: conjunctivae/corneas clear. PERRL, EOM's intact. Fundi benign. Ears: normal TM's and external ear canals both ears Nose: Nares normal. Septum midline. Mucosa normal. No drainage or sinus tenderness. Throat: lips, mucosa, and tongue normal; teeth and gums normal Neck: no adenopathy, no carotid bruit, no JVD, supple, symmetrical, trachea midline, and thyroid not enlarged, symmetric, no tenderness/mass/nodules Lungs: clear to auscultation bilaterally Heart: regular rate and rhythm, S1, S2 normal, no murmur, click, rub or gallop Abdomen: soft, non-tender; bowel sounds normal; no masses,  no organomegaly Extremities: mild deformity of b/l hands, extremities normal, atraumatic, no cyanosis or edema Pulses: 2+ and symmetric Skin: Skin color, texture, turgor normal. No rashes or lesions Lymph nodes: Cervical, supraclavicular, and axillary nodes normal. Neurologic: A&Ox 3, sitting in transport wheelchair. Moves all four extremities.      Assessment:    Healthy female exam.      Plan:    Anticipatory guidance given including wearing seatbelts, smoke detectors in the home, increasing physical activity, increasing p.o. intake of water and vegetables. -labs -immunizations reviewed. -discussed schedule mammogram. -colonoscopy done 10/03/19 -discussed pap. -given handout. See After Visit Summary for Counseling Recommendations  Well adult exam - Plan: TSH, T4, Free, Hemoglobin A1c, Lipid panel  Need for influenza vaccination  - Plan: Flu Vaccine QUAD High Dose(Fluad)  Rheumatoid arthritis with positive rheumatoid factor, involving unspecified site ([place]) -b/l hands.  Mild deformity on exam  -continue MTX -would recommend folic acid supplementation if not already taking -continue f/u with Rheumatology - Plan: Vitamin B12, CBC with Differential/Platelet, CMP  History of bilateral  knee replacement  -continue exercises -continue f/u with PT - Plan: CBC with Differential/Platelet  Bilateral carpal tunnel syndrome  -continue supportive care including wrist braces, topical analgesics, stretching, etc -continue f/u with ortho/hand sug - Plan: CBC with Differential/Platelet  Environmental and seasonal allergies  - Plan: sodium chloride (OCEAN) 0.65 % nasal spray, CBC with Differential/Platelet  Mild persistent asthma without complication  - Plan: albuterol (VENTOLIN HFA) 108 (90 Base) MCG/ACT inhaler, CBC with Differential/Platelet  F/u in 3-4 months, sooner if needed  Grier Mitts, MD

## 2022-06-27 DIAGNOSIS — R269 Unspecified abnormalities of gait and mobility: Secondary | ICD-10-CM | POA: Diagnosis not present

## 2022-06-27 DIAGNOSIS — R2689 Other abnormalities of gait and mobility: Secondary | ICD-10-CM | POA: Diagnosis not present

## 2022-06-27 DIAGNOSIS — M25569 Pain in unspecified knee: Secondary | ICD-10-CM | POA: Diagnosis not present

## 2022-06-27 DIAGNOSIS — M179 Osteoarthritis of knee, unspecified: Secondary | ICD-10-CM | POA: Diagnosis not present

## 2022-06-27 DIAGNOSIS — R278 Other lack of coordination: Secondary | ICD-10-CM | POA: Diagnosis not present

## 2022-06-27 DIAGNOSIS — Z471 Aftercare following joint replacement surgery: Secondary | ICD-10-CM | POA: Diagnosis not present

## 2022-06-27 DIAGNOSIS — M6281 Muscle weakness (generalized): Secondary | ICD-10-CM | POA: Diagnosis not present

## 2022-06-27 DIAGNOSIS — Z7389 Other problems related to life management difficulty: Secondary | ICD-10-CM | POA: Diagnosis not present

## 2022-06-27 DIAGNOSIS — M545 Low back pain, unspecified: Secondary | ICD-10-CM | POA: Diagnosis not present

## 2022-06-27 LAB — HEMOGLOBIN A1C: Hgb A1c MFr Bld: 5.4 % (ref 4.6–6.5)

## 2022-06-28 DIAGNOSIS — M6281 Muscle weakness (generalized): Secondary | ICD-10-CM | POA: Diagnosis not present

## 2022-06-28 DIAGNOSIS — R278 Other lack of coordination: Secondary | ICD-10-CM | POA: Diagnosis not present

## 2022-06-28 DIAGNOSIS — Z471 Aftercare following joint replacement surgery: Secondary | ICD-10-CM | POA: Diagnosis not present

## 2022-06-28 DIAGNOSIS — Z7389 Other problems related to life management difficulty: Secondary | ICD-10-CM | POA: Diagnosis not present

## 2022-06-28 DIAGNOSIS — R269 Unspecified abnormalities of gait and mobility: Secondary | ICD-10-CM | POA: Diagnosis not present

## 2022-06-28 DIAGNOSIS — M179 Osteoarthritis of knee, unspecified: Secondary | ICD-10-CM | POA: Diagnosis not present

## 2022-06-28 DIAGNOSIS — M545 Low back pain, unspecified: Secondary | ICD-10-CM | POA: Diagnosis not present

## 2022-06-28 DIAGNOSIS — R2689 Other abnormalities of gait and mobility: Secondary | ICD-10-CM | POA: Diagnosis not present

## 2022-06-28 DIAGNOSIS — M25569 Pain in unspecified knee: Secondary | ICD-10-CM | POA: Diagnosis not present

## 2022-06-29 DIAGNOSIS — R278 Other lack of coordination: Secondary | ICD-10-CM | POA: Diagnosis not present

## 2022-06-29 DIAGNOSIS — M6281 Muscle weakness (generalized): Secondary | ICD-10-CM | POA: Diagnosis not present

## 2022-06-29 DIAGNOSIS — M179 Osteoarthritis of knee, unspecified: Secondary | ICD-10-CM | POA: Diagnosis not present

## 2022-06-29 DIAGNOSIS — Z7389 Other problems related to life management difficulty: Secondary | ICD-10-CM | POA: Diagnosis not present

## 2022-06-29 DIAGNOSIS — R269 Unspecified abnormalities of gait and mobility: Secondary | ICD-10-CM | POA: Diagnosis not present

## 2022-06-29 DIAGNOSIS — M25569 Pain in unspecified knee: Secondary | ICD-10-CM | POA: Diagnosis not present

## 2022-06-29 DIAGNOSIS — R2689 Other abnormalities of gait and mobility: Secondary | ICD-10-CM | POA: Diagnosis not present

## 2022-06-29 DIAGNOSIS — Z471 Aftercare following joint replacement surgery: Secondary | ICD-10-CM | POA: Diagnosis not present

## 2022-06-29 DIAGNOSIS — M545 Low back pain, unspecified: Secondary | ICD-10-CM | POA: Diagnosis not present

## 2022-06-30 DIAGNOSIS — Z7389 Other problems related to life management difficulty: Secondary | ICD-10-CM | POA: Diagnosis not present

## 2022-06-30 DIAGNOSIS — M25569 Pain in unspecified knee: Secondary | ICD-10-CM | POA: Diagnosis not present

## 2022-06-30 DIAGNOSIS — R2689 Other abnormalities of gait and mobility: Secondary | ICD-10-CM | POA: Diagnosis not present

## 2022-06-30 DIAGNOSIS — Z471 Aftercare following joint replacement surgery: Secondary | ICD-10-CM | POA: Diagnosis not present

## 2022-06-30 DIAGNOSIS — M6281 Muscle weakness (generalized): Secondary | ICD-10-CM | POA: Diagnosis not present

## 2022-06-30 DIAGNOSIS — R269 Unspecified abnormalities of gait and mobility: Secondary | ICD-10-CM | POA: Diagnosis not present

## 2022-06-30 DIAGNOSIS — R278 Other lack of coordination: Secondary | ICD-10-CM | POA: Diagnosis not present

## 2022-06-30 DIAGNOSIS — M545 Low back pain, unspecified: Secondary | ICD-10-CM | POA: Diagnosis not present

## 2022-06-30 DIAGNOSIS — M179 Osteoarthritis of knee, unspecified: Secondary | ICD-10-CM | POA: Diagnosis not present

## 2022-07-18 ENCOUNTER — Encounter: Payer: Self-pay | Admitting: Family Medicine

## 2022-07-20 ENCOUNTER — Ambulatory Visit (INDEPENDENT_AMBULATORY_CARE_PROVIDER_SITE_OTHER): Payer: Medicare PPO

## 2022-07-20 VITALS — Ht 61.0 in | Wt 165.0 lb

## 2022-07-20 DIAGNOSIS — Z0000 Encounter for general adult medical examination without abnormal findings: Secondary | ICD-10-CM | POA: Diagnosis not present

## 2022-07-20 NOTE — Progress Notes (Signed)
Subjective:   [name] is a 71 y.o. female who presents for Medicare Annual (Subsequent) preventive examination.  Review of Systems    Virtual Visit via Telephone Note  I connected with  [name] on 07/20/22 at  2:30 PM EDT by telephone and verified that I am speaking with the correct person using two identifiers.  Location: Patient: Home Provider: Office Persons participating in the virtual visit: patient/Nurse Health Advisor   I discussed the limitations, risks, security and privacy concerns of performing an evaluation and management service by telephone and the availability of in person appointments. The patient expressed understanding and agreed to proceed.  Interactive audio and video telecommunications were attempted between this nurse and patient, however failed, due to patient having technical difficulties OR patient did not have access to video capability.  We continued and completed visit with audio only.  Some vital signs may be absent or patient reported.   [name], LPN  Cardiac Risk Factors include: advanced age (>104mn, >>32women)     Objective:    Today's Vitals   07/20/22 1441  Weight: 165 lb (74.8 kg)   Body mass index is 32.22 kg/m.     07/20/2022    3:09 PM 04/14/2022   12:13 AM 04/10/2022    2:17 PM 02/23/2022   10:44 AM 12/09/2021    2:00 AM 12/06/2021    1:21 PM 08/23/2021    4:37 PM  Advanced Directives  Does Patient Have a Medical Advance Directive? Yes Yes Yes Yes Yes Yes No  Type of [place] Out of facility DNR (pink MOST or yellow form);Living will;Healthcare Power of [place] Healthcare Power of [place] will   Does patient want to make changes to medical advance directive?  No - Patient declined No - Patient declined  No - Patient declined No - Patient declined   Copy  of [place] Chart? No - copy requested No - copy requested Yes - validated most recent copy scanned in chart (See row information) No - copy requested, Physician notified No - copy requested No - copy requested   Would patient like information on creating a medical advance directive?       No - Patient declined    Current Medications (verified) Outpatient Encounter Medications as of 07/20/2022  Medication Sig   acetaminophen (TYLENOL) 500 MG tablet Take 1,000 mg by mouth in the morning and at bedtime.   albuterol (VENTOLIN HFA) 108 (90 Base) MCG/ACT inhaler TAKE 2 PUFFS BY MOUTH EVERY 6 HOURS AS NEEDED   cyclobenzaprine (FLEXERIL) 5 MG tablet Take 5 mg by mouth every 8 (eight) hours as needed for muscle spasms.   cycloSPORINE (RESTASIS) 0.05 % ophthalmic emulsion Place 1 drop into both eyes 2 (two) times daily.   docusate sodium (COLACE) 100 MG capsule Take 100 mg by mouth daily.   DULoxetine (CYMBALTA) 30 MG capsule Take 30 mg by mouth daily.   fluticasone (FLONASE) 50 MCG/ACT nasal spray Place 1 spray into both nostrils daily.   gabapentin (NEURONTIN) 300 MG capsule Take 300 mg by mouth 2 (two) times daily.   guaiFENesin-dextromethorphan (ROBITUSSIN DM) 100-10 MG/5ML syrup Take 10 mLs by mouth every 6 (six) hours as needed for cough.   levocetirizine (XYZAL ALLERGY 24HR) 5 MG tablet Take 1 tablet (5 mg total) by mouth every evening. (Patient taking differently: Take 5  mg by mouth daily.)   Menthol, Topical Analgesic, (BIOFREEZE) 4 % GEL Apply 1 Application topically in the morning, at noon, and at bedtime. Apply to left knee and both wrist   methotrexate (RHEUMATREX) 2.5 MG tablet Take 10 mg by mouth once a week.   omeprazole (PRILOSEC) 20 MG capsule Take 20 mg by mouth in the morning and at bedtime.   ondansetron (ZOFRAN-ODT) 8 MG disintegrating tablet Take 8 mg by mouth 2 (two) times daily as needed for vomiting or nausea.   oxyCODONE (OXYCONTIN) 20 mg 12 hr tablet  Take 20 mg by mouth daily as needed (osteoarthritis).   oxyCODONE 10 MG TABS Take 1 tablet (10 mg total) by mouth every 4 (four) hours as needed for severe pain.   polyethylene glycol powder (GLYCOLAX/MIRALAX) 17 GM/SCOOP powder Take 17 g by mouth daily as needed. (Patient taking differently: Take 17 g by mouth daily as needed for mild constipation.)   psyllium (REGULOID) 0.52 g capsule Take 0.52 g by mouth at bedtime.   rivaroxaban (XARELTO) 10 MG TABS tablet Take 1 tablet (10 mg total) by mouth daily.   sennosides-docusate sodium (SENOKOT-S) 8.6-50 MG tablet Take 1 tablet by mouth every Monday, Wednesday, and Friday at 8 PM.   sodium chloride (OCEAN) 0.65 % nasal spray Place 1 spray into the nose as needed (allergy symptoms including congestion and drainage).   Vitamin D, Ergocalciferol, (DRISDOL) 1.25 MG (50000 UNIT) CAPS capsule Take 1 capsule (50,000 Units total) by mouth every 7 (seven) days. (Patient taking differently: Take 50,000 Units by mouth every Friday.)   WIXELA INHUB 250-50 MCG/ACT AEPB INHALE 1 PUFF BY MOUTH TWICE A DAY   No facility-administered encounter medications on file as of 07/20/2022.    Allergies (verified) Ibuprofen and Sulfa antibiotics   History: Past Medical History:  Diagnosis Date   Acute bronchitis    Allergy    Anxiety    Arthritis    knees   Asthma    Backache, unspecified    Cataract    removed both eyes   Depression    Diverticulitis    Diverticulosis of colon (without mention of hemorrhage)    Edema    GERD (gastroesophageal reflux disease)    Glaucoma    Lumbago    Obesity    Other chest pain    Pain in joint, ankle and foot    Sarcoidosis    Tibialis tendinitis    Unspecified asthma(493.90)    Past Surgical History:  Procedure Laterality Date   ABDOMINAL HYSTERECTOMY     CATARACT EXTRACTION W/PHACO Right 06/24/2014   Procedure: CATARACT EXTRACTION PHACO AND INTRAOCULAR LENS PLACEMENT (IOC) RIGHT EYE WITH GONIOSYNECHIALYSIS;   Surgeon: Marylynn Pearson, MD;  Location: [facility];  Service: Ophthalmology;  Laterality: Right;   COLONOSCOPY     EYE SURGERY Left    cataract surgery   HAND SURGERY Right    trigger finger release   IR RADIOLOGIST EVAL & MGMT  11/14/2018   KNEE ARTHROSCOPY Right    TOTAL KNEE ARTHROPLASTY Right 12/08/2021   Procedure: RIGHT TOTAL KNEE ARTHROPLASTY APPLICATION OF WOUND VAC;  Surgeon: Meredith Pel, MD;  Location: [facility];  Service: Orthopedics;  Laterality: Right;   TOTAL KNEE ARTHROPLASTY Left 04/13/2022   Procedure: LEFT TOTAL KNEE ARTHROPLASTY;  Surgeon: Meredith Pel, MD;  Location: [facility];  Service: Orthopedics;  Laterality: Left;   Family History  Problem Relation Age of Onset   Hypertension Mother    Stroke Mother  Diabetes Father    Sarcoidosis Sister    Colon cancer Neg Hx    Colon polyps Neg Hx    Esophageal cancer Neg Hx    Rectal cancer Neg Hx    Stomach cancer Neg Hx    Social History   Socioeconomic History   Marital status: Widowed    Spouse name: Not on file   Number of children: 0   Years of education: Not on file   Highest education level: Professional school degree (e.g., MD, DDS, DVM, JD)  Occupational History   Not on file  Tobacco Use   Smoking status: Never   Smokeless tobacco: Never  Vaping Use   Vaping Use: Never used  Substance and Sexual Activity   Alcohol use: Yes    Comment: 1 glass of wine or mixed drink once a week   Drug use: No   Sexual activity: Not on file  Other Topics Concern   Not on file  Social History Narrative   Not on file   Social Determinants of Health   Financial Resource Strain: Low Risk  (07/20/2022)   Overall Financial Resource Strain (CARDIA)    Difficulty of Paying Living Expenses: Not hard at all  Food Insecurity: No Food Insecurity (07/20/2022)   Hunger Vital Sign    Worried About Running Out of Food in the Last Year: Never true    Ran Out of Food in the Last Year: Never true  Transportation Needs: No  Transportation Needs (07/20/2022)   PRAPARE - Hydrologist (Medical): No    Lack of Transportation (Non-Medical): No  Physical Activity: Sufficiently Active (07/20/2022)   Exercise Vital Sign    Days of Exercise per Week: 5 days    Minutes of Exercise per Session: 30 min  Stress: Stress Concern Present (07/20/2022)   [place]    Feeling of Stress : To some extent  Social Connections: Moderately Integrated (07/20/2022)   Social Connection and Isolation Panel [NHANES]    Frequency of Communication with Friends and Family: More than three times a week    Frequency of Social Gatherings with Friends and Family: More than three times a week    Attends Religious Services: More than 4 times per year    Active Member of [employer] or Organizations: Yes    Attends Archivist Meetings: More than 4 times per year    Marital Status: Widowed    Tobacco Counseling Counseling given: Not Answered   Clinical Intake:  Pre-visit preparation completed: No  Pain : No/denies pain     BMI - recorded: 32.22 Nutritional Status: BMI > 30  Obese Nutritional Risks: None Diabetes: No  How often do you need to have someone help you when you read instructions, pamphlets, or other written materials from your doctor or pharmacy?: 1 - Never  Diabetic?  No  Interpreter Needed?: No  Information entered by :: Rolene Arbour LPN   Activities of Daily Living    07/20/2022    3:00 PM 04/14/2022   12:13 AM  In your present state of health, do you have any difficulty performing the following activities:  Hearing? 0 0  Vision? 0 0  Difficulty concentrating or making decisions? 0 0  Walking or climbing stairs? 1 1  Comment Due to bilat Knee replacement followed by Orthopedic   Dressing or bathing? [facility] staff assist   Doing errands, shopping? 1 1  Comment Family and friends Insurance underwriter and eating ? [facility] staff assist   Using the Toilet? Y   Comment [facility] staff assist   In the past six months, have you accidently leaked urine? Y   Comment Incontient of urine wears breifs   Do you have problems with loss of bowel control? Y   Comment Incontient of bowels wears breifs   Managing your Medications? Y   Nutritional therapist your Finances? N   Housekeeping or managing your Housekeeping? [place] staff assist     Patient Care Team: [name], MD as PCP - General (Family Medicine)  Indicate any recent Medical Services you may have received from other than [facility] providers in the past year (date may be approximate).     Assessment:   This is a routine wellness examination for [name].  Hearing/Vision screen Hearing Screening - Comments:: Denies hearing difficulties   Vision Screening - Comments:: Wears rx glasses - up to date with routine eye exams with Dr Eugenia Pancoast   Dietary issues and exercise activities discussed: Current Exercise Habits: Structured exercise class, Type of exercise: stretching;exercise ball;walking, Time (Minutes): 30, Frequency (Times/Week): 5, Weekly Exercise (Minutes/Week): 150, Intensity: Moderate, Exercise limited by: orthopedic condition(s)   Goals Addressed               This Visit's Progress     Patient Stated (pt-stated)        Move from current facility to an independent facility.       Depression Screen    07/20/2022    2:54 PM 06/26/2022   10:46 AM 01/19/2021    8:15 AM 09/01/2020   12:03 PM 09/11/2016    2:52 PM 06/14/2015   10:01 AM  PHQ 2/9 Scores  PHQ - 2 Score '2  1 4 2 1  '$ PHQ- 9 Score '4   16 13   '$ Exception Documentation  Patient refusal        Fall Risk    07/20/2022    3:07 PM 07/31/2021    9:08 PM 01/19/2021    8:19 AM 09/11/2016    2:52 PM 06/14/2015   10:01 AM  Fall Risk   Falls in the past year? 1 0   0 0 No No  Number falls in past  yr: 0  0    Injury with Fall? 0  0    Risk for fall due to : No Fall Risks  Impaired mobility;Impaired balance/gait;Impaired vision    Follow up Falls prevention discussed  Falls prevention discussed      FALL RISK PREVENTION PERTAINING TO THE HOME:  Any stairs in or around the home? Yes  If so, are there any without handrails? No  Home free of loose throw rugs in walkways, pet beds, electrical cords, etc? Yes  Adequate lighting in your home to reduce risk of falls? Yes   ASSISTIVE DEVICES UTILIZED TO PREVENT FALLS:  Life alert? No  Use of a cane, walker or w/c? Yes  Grab bars in the bathroom? Yes  Shower chair or bench in shower? Yes  Elevated toilet seat or a handicapped toilet? Yes   TIMED UP AND GO:  Was the test performed? No . Audio Visit  Cognitive Function:      08/28/2021    1:00 PM  Montreal Cognitive Assessment   Visuospatial/ Executive (0/5) 4  Naming (0/3) 3  Attention: Read list  of digits (0/2) 2  Attention: Read list of letters (0/1) 1  Attention: Serial 7 subtraction starting at 100 (0/3) 3  Language: Repeat phrase (0/2) 2  Language : Fluency (0/1) 1  Abstraction (0/2) 2  Delayed Recall (0/5) 4  Orientation (0/6) 6  Total 28  Adjusted Score (based on education) 28      07/20/2022    3:09 PM 01/19/2021    8:23 AM  6CIT Screen  What Year? 0 points 0 points  What month? 0 points 0 points  What time? 0 points   Count back from 20 0 points 0 points  Months in reverse 0 points 0 points  Repeat phrase 0 points 0 points  Total Score 0 points     Immunizations Immunization History  Administered Date(s) Administered   Fluad Quad(high Dose 65+) 05/26/2021, 06/26/2022   Influenza Split 07/17/2011, 06/24/2012   Influenza Whole 07/11/2007, 07/22/2008, 07/14/2009, 06/29/2010   Influenza, High Dose Seasonal PF 07/03/2017, 06/05/2018, 07/25/2019, 05/17/2020   Influenza,inj,Quad PF,6+ Mos 06/19/2013, 06/12/2014, 06/14/2015, 07/25/2016    Influenza-Unspecified 07/01/2015, 07/02/2016, 05/31/2020   PFIZER Comirnaty(Gray Top)Covid-19 Tri-Sucrose Vaccine 12/31/2020   PFIZER(Purple Top)SARS-COV-2 Vaccination 10/21/2019, 11/10/2019, 07/03/2020   Pneumococcal Conjugate-13 09/11/2016   Pneumococcal Polysaccharide-23 05/31/2020   Tdap 09/11/2016   Zoster Recombinat (Shingrix) 07/25/2019, 04/12/2020   Zoster, Live 06/23/2015    TDAP status: Up to date  Flu Vaccine status: Up to date  Pneumococcal vaccine status: Up to date  Covid-19 vaccine status: Completed vaccines  Qualifies for Shingles Vaccine? Yes   Zostavax completed Yes   Shingrix Completed?: Yes  Screening Tests Health Maintenance  Topic Date Due   COVID-19 Vaccine (5 - Pfizer risk series) 08/05/2022 (Originally 02/25/2021)   MAMMOGRAM  01/06/2023   TETANUS/TDAP  09/11/2026   COLONOSCOPY (Pts 45-23yr Insurance coverage will need to be confirmed)  10/02/2029   Pneumonia Vaccine 71 Years old  Completed   INFLUENZA VACCINE  Completed   DEXA SCAN  Completed   Hepatitis C Screening  Completed   Zoster Vaccines- Shingrix  Completed   HPV VACCINES  Aged Out    Health Maintenance  There are no preventive care reminders to display for this patient.   Colorectal cancer screening: Type of screening: Colonoscopy. Completed 10/03/19. Repeat every 10 years  Mammogram status: Completed 01/05/21. Repeat every year  Bone Density status: Completed 03/22/12. Results reflect: Bone density results: OSTEOPOROSIS. Repeat every   years.  Lung Cancer Screening: (Low Dose CT Chest recommended if Age 73-80years, 30 pack-year currently smoking OR have quit w/in 15years.) does not qualify.     Additional Screening:  Hepatitis C Screening: does qualify; Completed 02/06/18  Vision Screening: Recommended annual ophthalmology exams for early detection of glaucoma and other disorders of the eye. Is the patient up to date with their annual eye exam?  Yes  Who is the provider or what  is the name of the office in which the patient attends annual eye exams? Dr WEugenia PancoastIf pt is not established with a provider, would they like to be referred to a provider to establish care? No .   Dental Screening: Recommended annual dental exams for proper oral hygiene  Community Resource Referral / Chronic Care Management:  CRR required this visit?  No   CCM required this visit?  No      Plan:     I have personally reviewed and noted the following in the patient's chart:   Medical and social history Use of alcohol, tobacco or  illicit drugs  Current medications and supplements including opioid prescriptions. Patient is currently taking opioid prescriptions. Information provided to patient regarding non-opioid alternatives. Patient advised to discuss non-opioid treatment plan with their provider. Functional ability and status Nutritional status Physical activity Advanced directives List of other physicians Hospitalizations, surgeries, and ER visits in previous 12 months Vitals Screenings to include cognitive, depression, and falls Referrals and appointments  In addition, I have reviewed and discussed with patient certain preventive protocols, quality metrics, and best practice recommendations. A written personalized care plan for preventive services as well as general preventive health recommendations were provided to patient.     [name], LPN   19/10/2222   Nurse Notes: Patient currently pending counseling for depression. Patient states has no thoughts or plans of suicide.

## 2022-07-20 NOTE — Patient Instructions (Addendum)
[name] , Thank you for taking time to come for your Medicare Wellness Visit. I appreciate your ongoing commitment to your health goals. Please review the following plan we discussed and let me know if I can assist you in the future.   These are the goals we discussed:  Goals       Patient Stated (pt-stated)      Move from current facility to an independent facility.        This is a list of the screening recommended for you and due dates:  Health Maintenance  Topic Date Due   COVID-19 Vaccine (5 - Pfizer risk series) 08/05/2022*   Mammogram  01/06/2023   Tetanus Vaccine  09/11/2026   Colon Cancer Screening  10/02/2029   Pneumonia Vaccine  Completed   Flu Shot  Completed   DEXA scan (bone density measurement)  Completed   Hepatitis C Screening: USPSTF Recommendation to screen - Ages 7-79 yo.  Completed   Zoster (Shingles) Vaccine  Completed   HPV Vaccine  Aged Out  *Topic was postponed. The date shown is not the original due date.   Opioid Pain Medicine Management Opioids are powerful medicines that are used to treat moderate to severe pain. When used for short periods of time, they can help you to: Sleep better. Do better in physical or occupational therapy. Feel better in the first few days after an injury. Recover from surgery. Opioids should be taken with the supervision of a trained health care provider. They should be taken for the shortest period of time possible. This is because opioids can be addictive, and the longer you take opioids, the greater your risk of addiction. This addiction can also be called opioid use disorder. What are the risks? Using opioid pain medicines for longer than 3 days increases your risk of side effects. Side effects include: Constipation. Nausea and vomiting. Breathing difficulties (respiratory depression). Drowsiness. Confusion. Opioid use disorder. Itching. Taking opioid pain medicine for a long period of time can affect your  ability to do daily tasks. It also puts you at risk for: Motor vehicle crashes. Depression. Suicide. Heart attack. Overdose, which can be life-threatening. What is a pain treatment plan? A pain treatment plan is an agreement between you and your health care provider. Pain is unique to each person, and treatments vary depending on your condition. To manage your pain, you and your health care provider need to work together. To help you do this: Discuss the goals of your treatment, including how much pain you might expect to have and how you will manage the pain. Review the risks and benefits of taking opioid medicines. Remember that a good treatment plan uses more than one approach and minimizes the chance of side effects. Be honest about the amount of medicines you take and about any drug or alcohol use. Get pain medicine prescriptions from only one health care provider. Pain can be managed with many types of alternative treatments. Ask your health care provider to refer you to one or more specialists who can help you manage pain through: Physical or occupational therapy. Counseling (cognitive behavioral therapy). Good nutrition. Biofeedback. Massage. Meditation. Non-opioid medicine. Following a gentle exercise program. How to use opioid pain medicine Taking medicine Take your pain medicine exactly as told by your health care provider. Take it only when you need it. If your pain gets less severe, you may take less than your prescribed dose if your health care provider approves. If you are not  having pain, do nottake pain medicine unless your health care provider tells you to take it. If your pain is severe, do nottry to treat it yourself by taking more pills than instructed on your prescription. Contact your health care provider for help. Write down the times when you take your pain medicine. It is easy to become confused while on pain medicine. Writing the time can help you avoid  overdose. Take other over-the-counter or prescription medicines only as told by your health care provider. Keeping yourself and others safe  While you are taking opioid pain medicine: Do not drive, use machinery, or power tools. Do not sign legal documents. Do not drink alcohol. Do not take sleeping pills. Do not supervise children by yourself. Do not do activities that require climbing or being in high places. Do not go to a lake, river, ocean, spa, or swimming pool. Do not share your pain medicine with anyone. Keep pain medicine in a locked cabinet or in a secure area where pets and children cannot reach it. Stopping your use of opioids If you have been taking opioid medicine for more than a few weeks, you may need to slowly decrease (taper) how much you take until you stop completely. Tapering your use of opioids can decrease your risk of symptoms of withdrawal, such as: Pain and cramping in the abdomen. Nausea. Sweating. Sleepiness. Restlessness. Uncontrollable shaking (tremors). Cravings for the medicine. Do not attempt to taper your use of opioids on your own. Talk with your health care provider about how to do this. Your health care provider may prescribe a step-down schedule based on how much medicine you are taking and how long you have been taking it. Getting rid of leftover pills Do not save any leftover pills. Get rid of leftover pills safely by: Taking the medicine to a prescription take-back program. This is usually offered by the county or law enforcement. Bringing them to a pharmacy that has a drug disposal container. Flushing them down the toilet. Check the label or package insert of your medicine to see whether this is safe to do. Throwing them out in the trash. Check the label or package insert of your medicine to see whether this is safe to do. If it is safe to throw it out, remove the medicine from the original container, put it into a sealable bag or container, and  mix it with used coffee grounds, food scraps, dirt, or cat litter before putting it in the trash. Follow these instructions at home: Activity Do exercises as told by your health care provider. Avoid activities that make your pain worse. Return to your normal activities as told by your health care provider. Ask your health care provider what activities are safe for you. General instructions You may need to take these actions to prevent or treat constipation: Drink enough fluid to keep your urine pale yellow. Take over-the-counter or prescription medicines. Eat foods that are high in fiber, such as beans, whole grains, and fresh fruits and vegetables. Limit foods that are high in fat and processed sugars, such as fried or sweet foods. Keep all follow-up visits. This is important. Where to find support If you have been taking opioids for a long time, you may benefit from receiving support for quitting from a local support group or counselor. Ask your health care provider for a referral to these resources in your area. Where to find more information Centers for Disease Control and Prevention (CDC): http://www.wolf.info/ U.S. Food and Drug Administration (  FDA): [place].ch Get help right away if: You may have taken too much of an opioid (overdosed). Common symptoms of an overdose: Your breathing is slower or more shallow than normal. You have a very slow heartbeat (pulse). You have slurred speech. You have nausea and vomiting. Your pupils become very small. You have other potential symptoms: You are very confused. You faint or feel like you will faint. You have cold, clammy skin. You have blue lips or fingernails. You have thoughts of harming yourself or harming others. These symptoms may represent a serious problem that is an emergency. Do not wait to see if the symptoms will go away. Get medical help right away. Call your local emergency services (911 in the [place] Do not drive yourself to the  hospital.  If you ever feel like you may hurt yourself or others, or have thoughts about taking your own life, get help right away. Go to your nearest emergency department or: Call your local emergency services (911 in the [place] Call the [facility] 613 874 9933 in the [place] Call a suicide crisis helpline, such as the [facility] at (872)045-4368 or 988 in the [place]. This is open 24 hours a day in the [place] Text the Crisis Text Line at (727)873-8295 (in the [place].). Summary Opioid medicines can help you manage moderate to severe pain for a short period of time. A pain treatment plan is an agreement between you and your health care provider. Discuss the goals of your treatment, including how much pain you might expect to have and how you will manage the pain. If you think that you or someone else may have taken too much of an opioid, get medical help right away. This information is not intended to replace advice given to you by your health care provider. Make sure you discuss any questions you have with your health care provider. Document Revised: 04/13/2021 Document Reviewed: 12/29/2020 Elsevier Patient Education  [place] directives: Please bring a copy of your health care power of attorney and living will to the office to be added to your chart at your convenience.   Conditions/risks identified: None  Next appointment: Follow up in one year for your annual wellness visit     Preventive Care 65 Years and Older, Female Preventive care refers to lifestyle choices and visits with your health care provider that can promote health and wellness. What does preventive care include? A yearly physical exam. This is also called an annual well check. Dental exams once or twice a year. Routine eye exams. Ask your health care provider how often you should have your eyes checked. Personal lifestyle choices, including: Daily care of your teeth  and gums. Regular physical activity. Eating a healthy diet. Avoiding tobacco and drug use. Limiting alcohol use. Practicing safe sex. Taking low-dose aspirin every day. Taking vitamin and mineral supplements as recommended by your health care provider. What happens during an annual well check? The services and screenings done by your health care provider during your annual well check will depend on your age, overall health, lifestyle risk factors, and family history of disease. Counseling  Your health care provider may ask you questions about your: Alcohol use. Tobacco use. Drug use. Emotional well-being. Home and relationship well-being. Sexual activity. Eating habits. History of falls. Memory and ability to understand (cognition). Work and work Statistician. Reproductive health. Screening  You may have the following tests or measurements: Height, weight, and BMI. Blood pressure. Lipid and  cholesterol levels. These may be checked every 5 years, or more frequently if you are over 22 years old. Skin check. Lung cancer screening. You may have this screening every year starting at age 79 if you have a 30-pack-year history of smoking and currently smoke or have quit within the past 15 years. Fecal occult blood test (FOBT) of the stool. You may have this test every year starting at age 61. Flexible sigmoidoscopy or colonoscopy. You may have a sigmoidoscopy every 5 years or a colonoscopy every 10 years starting at age 35. Hepatitis C blood test. Hepatitis B blood test. Sexually transmitted disease (STD) testing. Diabetes screening. This is done by checking your blood sugar (glucose) after you have not eaten for a while (fasting). You may have this done every 1-3 years. Bone density scan. This is done to screen for osteoporosis. You may have this done starting at age 71. Mammogram. This may be done every 1-2 years. Talk to your health care provider about how often you should have regular  mammograms. Talk with your health care provider about your test results, treatment options, and if necessary, the need for more tests. Vaccines  Your health care provider may recommend certain vaccines, such as: Influenza vaccine. This is recommended every year. Tetanus, diphtheria, and acellular pertussis (Tdap, Td) vaccine. You may need a Td booster every 10 years. Zoster vaccine. You may need this after age 108. Pneumococcal 13-valent conjugate (PCV13) vaccine. One dose is recommended after age 13. Pneumococcal polysaccharide (PPSV23) vaccine. One dose is recommended after age 26. Talk to your health care provider about which screenings and vaccines you need and how often you need them. This information is not intended to replace advice given to you by your health care provider. Make sure you discuss any questions you have with your health care provider. Document Released: 10/15/2015 Document Revised: 06/07/2016 Document Reviewed: 07/20/2015 Elsevier Interactive Patient Education  2017 [place] Prevention in the Home Falls can cause injuries. They can happen to people of all ages. There are many things you can do to make your home safe and to help prevent falls. What can I do on the outside of my home? Regularly fix the edges of walkways and driveways and fix any cracks. Remove anything that might make you trip as you walk through a door, such as a raised step or threshold. Trim any bushes or trees on the path to your home. Use bright outdoor lighting. Clear any walking paths of anything that might make someone trip, such as rocks or tools. Regularly check to see if handrails are loose or broken. Make sure that both sides of any steps have handrails. Any raised decks and porches should have guardrails on the edges. Have any leaves, snow, or ice cleared regularly. Use sand or salt on walking paths during winter. Clean up any spills in your garage right away. This includes oil  or grease spills. What can I do in the bathroom? Use night lights. Install grab bars by the toilet and in the tub and shower. Do not use towel bars as grab bars. Use non-skid mats or decals in the tub or shower. If you need to sit down in the shower, use a plastic, non-slip stool. Keep the floor dry. Clean up any water that spills on the floor as soon as it happens. Remove soap buildup in the tub or shower regularly. Attach bath mats securely with double-sided non-slip rug tape. Do not have throw rugs and other  things on the floor that can make you trip. What can I do in the bedroom? Use night lights. Make sure that you have a light by your bed that is easy to reach. Do not use any sheets or blankets that are too big for your bed. They should not hang down onto the floor. Have a firm chair that has side arms. You can use this for support while you get dressed. Do not have throw rugs and other things on the floor that can make you trip. What can I do in the kitchen? Clean up any spills right away. Avoid walking on wet floors. Keep items that you use a lot in easy-to-reach places. If you need to reach something above you, use a strong step stool that has a grab bar. Keep electrical cords out of the way. Do not use floor polish or wax that makes floors slippery. If you must use wax, use non-skid floor wax. Do not have throw rugs and other things on the floor that can make you trip. What can I do with my stairs? Do not leave any items on the stairs. Make sure that there are handrails on both sides of the stairs and use them. Fix handrails that are broken or loose. Make sure that handrails are as long as the stairways. Check any carpeting to make sure that it is firmly attached to the stairs. Fix any carpet that is loose or worn. Avoid having throw rugs at the top or bottom of the stairs. If you do have throw rugs, attach them to the floor with carpet tape. Make sure that you have a light  switch at the top of the stairs and the bottom of the stairs. If you do not have them, ask someone to add them for you. What else can I do to help prevent falls? Wear shoes that: Do not have high heels. Have rubber bottoms. Are comfortable and fit you well. Are closed at the toe. Do not wear sandals. If you use a stepladder: Make sure that it is fully opened. Do not climb a closed stepladder. Make sure that both sides of the stepladder are locked into place. Ask someone to hold it for you, if possible. Clearly mark and make sure that you can see: Any grab bars or handrails. First and last steps. Where the edge of each step is. Use tools that help you move around (mobility aids) if they are needed. These include: Canes. Walkers. Scooters. Crutches. Turn on the lights when you go into a dark area. Replace any light bulbs as soon as they burn out. Set up your furniture so you have a clear path. Avoid moving your furniture around. If any of your floors are uneven, fix them. If there are any pets around you, be aware of where they are. Review your medicines with your doctor. Some medicines can make you feel dizzy. This can increase your chance of falling. Ask your doctor what other things that you can do to help prevent falls. This information is not intended to replace advice given to you by your health care provider. Make sure you discuss any questions you have with your health care provider. Document Released: 07/15/2009 Document Revised: 02/24/2016 Document Reviewed: 10/23/2014 Elsevier Interactive Patient Education  2017 [employer].

## 2022-09-06 ENCOUNTER — Ambulatory Visit: Payer: Medicare PPO | Admitting: Orthopedic Surgery

## 2022-09-06 ENCOUNTER — Ambulatory Visit (INDEPENDENT_AMBULATORY_CARE_PROVIDER_SITE_OTHER): Payer: Medicare PPO

## 2022-09-06 DIAGNOSIS — M542 Cervicalgia: Secondary | ICD-10-CM

## 2022-09-09 ENCOUNTER — Encounter: Payer: Self-pay | Admitting: Orthopedic Surgery

## 2022-09-09 NOTE — Progress Notes (Signed)
Office Visit Note   Patient: [name]           Date of Birth: 04/15/1951           MRN: [id number] Visit Date: 09/06/2022 Requested by: [name], MD [place],  [place] [address] PCP: [name], MD  Subjective: Chief Complaint  Patient presents with   Left Knee - Routine Post Op    04/13/22 left TKA   Neck - Pain    HPI: [name] is a 71 y.o. female who presents to the office reporting bilateral hand weakness with some numbness.  She is doing well following left total knee replacement 04/13/2022.  Minimal discomfort.  Still has some weakness.  Cannot quite yet get to 400 feet with a walker but she is making good progress.  More problematic at this time for [name] is her hand weakness.  She is taking methotrexate for recent diagnosis of rheumatoid arthritis.  This is consistent with her bone findings at the time of surgery.  Denies much in the way of neck pain..                ROS: All systems reviewed are negative as they relate to the chief complaint within the history of present illness.  Patient denies fevers or chills.  Assessment & Plan: Visit Diagnoses:  1. Neck pain     Plan: Impression is significant interosseous wasting in both hands with weakness.  No real symptoms referable to the elbow.  Prior nerve conduction studies about a year ago showed severe carpal tunnel on the right-hand side but no real ulnar symptoms or compression on either side.  That has changed.  Patient requires bilateral nerve conduction upper extremities to evaluate ulnar wasting and possible cervical spine stenosis or myelopathy.  MRI C-spine to evaluate myelopathy.  Radiographs are somewhat underwhelming in terms of the amount of degenerative disease present.  Follow-up after those studies.  She [facility]. Follow-Up Instructions: No follow-ups on file.   Orders:  Orders Placed This Encounter  Procedures   XR Cervical Spine 2  or 3 views   MR Cervical Spine w/o contrast   Ambulatory referral to Physical Medicine Rehab   No orders of the defined types were placed in this encounter.     Procedures: No procedures performed   Clinical Data: No additional findings.  Objective: Vital Signs: There were no vitals taken for this visit.  Physical Exam:  Constitutional: Patient appears well-developed HEENT:  Head: Normocephalic Eyes:EOM are normal Neck: Normal range of motion Cardiovascular: Normal rate Pulmonary/chest: Effort normal Neurologic: Patient is alert Skin: Skin is warm Psychiatric: Patient has normal mood and affect  Ortho Exam: Ortho exam demonstrates well-healed incisions in both knees.  She has excellent range of motion and alignment bending to just past 90 degrees with both knees and she is able to do straight leg raises with both legs.  On the upper extremities she has significant interosseous wasting and thenar wasting in both hands.  Interosseous strength is weak.  EPL FPL strength is 5- out of 5.  Does have some MCP swelling consistent with her diagnosis of rheumatoid arthritis.  Negative Tinel's at the elbow no subluxation of the ulnar nerve at either elbow.  Wrist range of motion is full.  Radial pulse intact.  No definite paresthesias C5-T1.  Neck range of motion Extension 30 flexion 30 rotation is about 45 to 50 degrees bilaterally.  Reflexes symmetric 0 1+ out of 4 bilateral biceps triceps and Achilles.  Negative clonus bilateral lower extremities.  Specialty Comments:  No specialty comments available.  Imaging: No results found.   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   Foot pain 09/13/2020   Knee pain 08/24/2018   Fall in home 07/14/2015   TIBIALIS TENDINITIS 08/25/2009   CHEST PAIN, OTHER, PAIN 10/21/2008   PEDAL EDEMA 07/22/2008   ANKLE PAIN, BILATERAL 04/01/2008   LOW BACK PAIN 04/01/2008   ACUTE BRONCHITIS 09/23/2007   SARCOIDOSIS 09/02/2007   Asthma 09/02/2007   Diverticulosis of colon 07/11/2007   BACK PAIN, CHRONIC 07/11/2007   Past Medical History:  Diagnosis Date   Acute bronchitis    Allergy    Anxiety    Arthritis    knees   Asthma    Backache, unspecified    Cataract    removed both eyes   Depression    Diverticulitis    Diverticulosis of colon (without mention of hemorrhage)    Edema    GERD (gastroesophageal reflux disease)    Glaucoma    Lumbago    Obesity    Other chest pain    Pain in joint, ankle and foot    Sarcoidosis    Tibialis tendinitis    Unspecified asthma(493.90)     Family History  Problem Relation Age of Onset   Hypertension Mother    Stroke Mother    Diabetes Father    Sarcoidosis Sister    Colon cancer Neg Hx    Colon polyps Neg Hx    Esophageal cancer Neg Hx    Rectal cancer Neg Hx    Stomach cancer Neg Hx     Past Surgical History:  Procedure Laterality Date   ABDOMINAL HYSTERECTOMY     CATARACT EXTRACTION W/PHACO Right 06/24/2014   Procedure: CATARACT EXTRACTION PHACO AND INTRAOCULAR LENS PLACEMENT ([place]) RIGHT EYE WITH GONIOSYNECHIALYSIS;  Surgeon: Marylynn Pearson, MD;  Location: [facility];  Service: Ophthalmology;  Laterality: Right;   COLONOSCOPY     EYE SURGERY Left    cataract surgery   HAND SURGERY Right    trigger finger release   IR RADIOLOGIST EVAL & MGMT  11/14/2018   KNEE ARTHROSCOPY Right    TOTAL KNEE ARTHROPLASTY Right 12/08/2021   Procedure: RIGHT TOTAL KNEE ARTHROPLASTY APPLICATION OF WOUND VAC;   Surgeon: Meredith Pel, MD;  Location: [facility];  Service: Orthopedics;  Laterality: Right;   TOTAL KNEE ARTHROPLASTY Left 04/13/2022   Procedure: LEFT TOTAL KNEE ARTHROPLASTY;  Surgeon: Meredith Pel, MD;  Location: [facility];  Service: Orthopedics;  Laterality: Left;   Social History   Occupational History   Not on file  Tobacco Use   Smoking status: Never   Smokeless tobacco: Never  Vaping Use   Vaping Use: Never used  Substance and Sexual Activity   Alcohol use: Yes    Comment: 1 glass of wine or mixed drink once a week   Drug use: No  Sexual activity: Not on file

## 2022-09-15 ENCOUNTER — Ambulatory Visit (INDEPENDENT_AMBULATORY_CARE_PROVIDER_SITE_OTHER): Payer: Medicare PPO | Admitting: Physical Medicine and Rehabilitation

## 2022-09-15 ENCOUNTER — Telehealth: Payer: Self-pay | Admitting: *Deleted

## 2022-09-15 DIAGNOSIS — R531 Weakness: Secondary | ICD-10-CM

## 2022-09-15 DIAGNOSIS — R202 Paresthesia of skin: Secondary | ICD-10-CM | POA: Diagnosis not present

## 2022-09-15 NOTE — Progress Notes (Unsigned)
Numeric Pain Rating Scale and Functional Assessment Average Pain 4   In the last MONTH (on 0-10 scale) has pain interfered with the following?  1. General activity like being  able to carry out your everyday physical activities such as walking, climbing stairs, carrying groceries, or moving a chair?  Rating(6)   Right handed. Left elbow pain that radiates down to the hand. Weakness in both hands

## 2022-09-15 NOTE — Telephone Encounter (Signed)
(  Late entry for 09/06/22)- Ortho bundle completed.

## 2022-09-18 NOTE — Procedures (Unsigned)
EMG & NCV Findings: Evaluation of the left median motor nerve showed reduced amplitude (4.5 mV).  The right median motor nerve showed prolonged distal onset latency (4.5 ms), reduced amplitude (3.8 mV), and decreased conduction velocity (Elbow-Wrist, 39 m/s).  The left median (across palm) sensory nerve showed no response (Palm) and prolonged distal peak latency (4.1 ms).  The right median (across palm) sensory nerve showed prolonged distal peak latency (Wrist, 6.0 ms), reduced amplitude (5.4 V), and prolonged distal peak latency (Palm, 10.3 ms).  The right ulnar sensory nerve showed prolonged distal peak latency (3.9 ms) and decreased conduction velocity (Wrist-5th Digit, 36 m/s).  All remaining nerves (as indicated in the following tables) were within normal limits.  Left vs. Right side comparison data for the median motor nerve indicates abnormal L-R latency difference (1.1 ms) and abnormal L-R velocity difference (Elbow-Wrist, 11 m/s).  The ulnar motor nerve indicates abnormal L-R amplitude difference (39.0 %).  All remaining left vs. right side differences were within normal limits.    All examined muscles (as indicated in the following table) showed no evidence of electrical instability.    Impression: The above electrodiagnostic study is ABNORMAL and reveals evidence of:   a severe right median nerve entrapment at the wrist (carpal tunnel syndrome) affecting sensory and motor components.   a mild left median nerve entrapment at the wrist (carpal tunnel syndrome) affecting sensory components.  There is no significant electrodiagnostic evidence of any other focal nerve entrapment, brachial plexopathy or cervical radiculopathy. **The nerve conduction studies are very sensitive for demyelinating entrapment and I did not see anything at the elbow or ulnar nerve.  This really would hand more towards a cervical or central nervous system issue for the atrophy seen in the hands.  The needle EMG however  was fairly normal with good motor unit activation.  There were no specific findings of polyradiculopathy.  Recommendations: 1.  Follow-up with referring physician. 2.  Continue current management of symptoms. 3.  Suggest surgical evaluation for the right median neuropathy. 4.  Cervical spine MRI is paramount at this point to rule out any type of myelopathy and cord involvement.  ___________________________ [name] Board Certified, American Board of Physical Medicine and Rehabilitation    Nerve Conduction Studies Anti Sensory Summary Table   Stim Site NR Peak (ms) Norm Peak (ms) P-T Amp (V) Norm P-T Amp Site1 Site2 Delta-P (ms) Dist (cm) Vel (m/s) Norm Vel (m/s)  Left Median Acr Palm Anti Sensory (2nd Digit)  32.9C  Wrist    *4.1 <3.6 27.8 >10 Wrist Palm  0.0    Palm *NR  <2.0          Right Median Acr Palm Anti Sensory (2nd Digit)  32.5C  Wrist    *6.0 <3.6 *5.4 >10 Wrist Palm 4.3 0.0    Palm    *10.3 <2.0 11.1         Left Radial Anti Sensory (Base 1st Digit)  33.7C  Wrist    2.2 <3.1 18.7  Wrist Base 1st Digit 2.2 0.0    Right Radial Anti Sensory (Base 1st Digit)  32.5C  Wrist    2.5 <3.1 30.3  Wrist Base 1st Digit 2.5 0.0    Left Ulnar Anti Sensory (5th Digit)  33.3C  Wrist    3.6 <3.7 20.4 >15.0 Wrist 5th Digit 3.6 14.0 39 >38  Right Ulnar Anti Sensory (5th Digit)  32.7C  Wrist    *3.9 <3.7 24.3 >15.0 Wrist 5th Digit  3.9 14.0 *36 >38   Motor Summary Table   Stim Site NR Onset (ms) Norm Onset (ms) O-P Amp (mV) Norm O-P Amp Site1 Site2 Delta-0 (ms) Dist (cm) Vel (m/s) Norm Vel (m/s)  Left Median Motor (Abd Poll Brev)  33.2C  Wrist    3.4 <4.2 *4.5 >5 Elbow Wrist 3.6 18.0 50 >50  Elbow    7.0  4.7         Right Median Motor (Abd Poll Brev)  32.8C  Wrist    *4.5 <4.2 *3.8 >5 Elbow Wrist 5.0 19.5 *39 >50  Elbow    9.5  2.9         Left Ulnar Motor (Abd Dig Min)  32.5C  Wrist    3.0 <4.2 5.9 >3 B Elbow Wrist 3.4 18.0 53 >53  B Elbow    6.4  2.8  A Elbow  B Elbow 1.2 10.0 83 >53  A Elbow    7.6  6.4         Right Ulnar Motor (Abd Dig Min)  32.3C  Wrist    3.4 <4.2 3.6 >3 B Elbow Wrist 3.1 17.0 55 >53  B Elbow    6.5  6.9  A Elbow B Elbow 1.2 10.0 83 >53  A Elbow    7.7  6.9          EMG   Side Muscle Nerve Root Ins Act Fibs Psw Amp Dur Poly Recrt Int Fraser Din Comment  Right 1stDorInt Ulnar C8-T1 Nml Nml Nml Nml Nml 0 Nml Nml   Right Abd Poll Brev Median C8-T1 Nml Nml Nml Nml Nml 0 Nml Nml   Right ExtDigCom   Nml Nml Nml Nml Nml 0 Nml Nml   Right Triceps Radial C6-7-8 Nml Nml Nml Nml Nml 0 Nml Nml   Right Deltoid Axillary C5-6 Nml Nml Nml Nml Nml 0 Nml Nml   Left 1stDorInt Ulnar C8-T1 Nml Nml Nml Nml Nml 0 Nml Nml   Left Abd Poll Brev Median C8-T1 Nml Nml Nml Nml Nml 0 Nml Nml   Left ExtDigCom   Nml Nml Nml Nml Nml 0 Nml Nml   Left Triceps Radial C6-7-8 Nml Nml Nml Nml Nml 0 Nml Nml   Left Deltoid Axillary C5-6 Nml Nml Nml Nml Nml 0 Nml Nml     Nerve Conduction Studies Anti Sensory Left/Right Comparison   Stim Site L Lat (ms) R Lat (ms) L-R Lat (ms) L Amp (V) R Amp (V) L-R Amp (%) Site1 Site2 L Vel (m/s) R Vel (m/s) L-R Vel (m/s)  Median Acr Palm Anti Sensory (2nd Digit)  32.9C  Wrist *4.1 *6.0 1.9 27.8 *5.4 80.6 Wrist Palm     Palm  *10.3   11.1        Radial Anti Sensory (Base 1st Digit)  33.7C  Wrist 2.2 2.5 0.3 18.7 30.3 38.3 Wrist Base 1st Digit     Ulnar Anti Sensory (5th Digit)  33.3C  Wrist 3.6 *3.9 0.3 20.4 24.3 16.0 Wrist 5th Digit 39 *36 3   Motor Left/Right Comparison   Stim Site L Lat (ms) R Lat (ms) L-R Lat (ms) L Amp (mV) R Amp (mV) L-R Amp (%) Site1 Site2 L Vel (m/s) R Vel (m/s) L-R Vel (m/s)  Median Motor (Abd Poll Brev)  33.2C  Wrist 3.4 *4.5 *1.1 *4.5 *3.8 15.6 Elbow Wrist 50 *39 *11  Elbow 7.0 9.5 2.5 4.7 2.9 38.3       Ulnar Motor (Abd Dig Min)  32.5C  Wrist 3.0 3.4 0.4 5.9 3.6 *39.0 B Elbow Wrist 53 55 2  B Elbow 6.4 6.5 0.1 2.8 6.9 59.4 A Elbow B Elbow 83 83 0  A Elbow 7.6 7.7 0.1 6.4 6.9 7.2           Waveforms:

## 2022-09-19 NOTE — Progress Notes (Signed)
[name] - 71 y.o. female MRN [id number]  Date of birth: 08-Oct-1950  Office Visit Note: Visit Date: 09/15/2022 PCP: [name], MD Referred by: Meredith Pel, MD  Subjective: Chief Complaint  Patient presents with   Left Elbow - Pain   Right Hand - Weakness   Left Hand - Weakness   HPI:  [name] is a 71 y.o. female who comes in today at the request of Dr. Anderson Malta for evaluation and management of the Bilateral upper extremities.  Patient is Right hand dominant.  He is someone that I saw in June 2022 at the request of Dr. Marlou Sa for electrodiagnostic studies.  She had severe right median nerve neuropathy with mild left median neuropathy.  No other nerve entrapments or issues at that time.  Since I have seen her she is undergone 2 knee replacements left and right.  She has also been followed by Dr. Lahoma Rocker a rheumatologist at [facility] for new onset of rheumatoid arthritis.  History of sarcoidosis.  Dr. Marlou Sa and noticed that she had some wasting of the hand intrinsic muscles bilaterally as well as weakness in the hands and requested updated electrodiagnostic study.  Cervical spine x-ray was performed with significant degenerative findings.  MRI of the cervical spine is ordered.   Review of Systems  Musculoskeletal:  Positive for back pain, joint pain and neck pain.  Neurological:  Positive for tingling and focal weakness.  All other systems reviewed and are negative.  Otherwise per HPI.  Assessment & Plan: Visit Diagnoses:    ICD-10-CM   1. Paresthesia of skin  R20.2 NCV with EMG (electromyography)    2. Weakness  R53.1       Plan: Impression: The above electrodiagnostic study is ABNORMAL and reveals evidence of:   a severe right median nerve entrapment at the wrist (carpal tunnel syndrome) affecting sensory and motor components.   a mild left median nerve entrapment at the wrist (carpal tunnel syndrome) affecting sensory  components.  There is no significant electrodiagnostic evidence of any other focal nerve entrapment, brachial plexopathy or cervical radiculopathy. **The nerve conduction studies are very sensitive for demyelinating entrapment and I did not see anything at the elbow or ulnar nerve.  This really would hand more towards a cervical or central nervous system issue for the atrophy seen in the hands.  The needle EMG however was fairly normal with good motor unit activation.  There were no specific findings of polyradiculopathy.  Recommendations: 1.  Follow-up with referring physician. 2.  Continue current management of symptoms. 3.  Suggest surgical evaluation for the right median neuropathy. 4.  Cervical spine MRI is paramount at this point to rule out any type of myelopathy and cord involvement.   Meds & Orders: No orders of the defined types were placed in this encounter.   Orders Placed This Encounter  Procedures   NCV with EMG (electromyography)    Follow-up: Return in about 2 weeks (around 09/29/2022) for  [name], MD.   Procedures: No procedures performed  EMG & NCV Findings: Evaluation of the left median motor nerve showed reduced amplitude (4.5 mV).  The right median motor nerve showed prolonged distal onset latency (4.5 ms), reduced amplitude (3.8 mV), and decreased conduction velocity (Elbow-Wrist, 39 m/s).  The left median (across palm) sensory nerve showed no response (Palm) and prolonged distal peak latency (4.1 ms).  The right median (across palm) sensory nerve showed prolonged distal peak  latency (Wrist, 6.0 ms), reduced amplitude (5.4 V), and prolonged distal peak latency (Palm, 10.3 ms).  The right ulnar sensory nerve showed prolonged distal peak latency (3.9 ms) and decreased conduction velocity (Wrist-5th Digit, 36 m/s).  All remaining nerves (as indicated in the following tables) were within normal limits.  Left vs. Right side comparison data for the median motor nerve  indicates abnormal L-R latency difference (1.1 ms) and abnormal L-R velocity difference (Elbow-Wrist, 11 m/s).  The ulnar motor nerve indicates abnormal L-R amplitude difference (39.0 %).  All remaining left vs. right side differences were within normal limits.    All examined muscles (as indicated in the following table) showed no evidence of electrical instability.    Impression: The above electrodiagnostic study is ABNORMAL and reveals evidence of:   a severe right median nerve entrapment at the wrist (carpal tunnel syndrome) affecting sensory and motor components.   a mild left median nerve entrapment at the wrist (carpal tunnel syndrome) affecting sensory components.  There is no significant electrodiagnostic evidence of any other focal nerve entrapment, brachial plexopathy or cervical radiculopathy. **The nerve conduction studies are very sensitive for demyelinating entrapment and I did not see anything at the elbow or ulnar nerve.  This really would hand more towards a cervical or central nervous system issue for the atrophy seen in the hands.  The needle EMG however was fairly normal with good motor unit activation.  There were no specific findings of polyradiculopathy.  Recommendations: 1.  Follow-up with referring physician. 2.  Continue current management of symptoms. 3.  Suggest surgical evaluation for the right median neuropathy. 4.  Cervical spine MRI is paramount at this point to rule out any type of myelopathy and cord involvement.  ___________________________ [name] Board Certified, American Board of Physical Medicine and Rehabilitation    Nerve Conduction Studies Anti Sensory Summary Table   Stim Site NR Peak (ms) Norm Peak (ms) P-T Amp (V) Norm P-T Amp Site1 Site2 Delta-P (ms) Dist (cm) Vel (m/s) Norm Vel (m/s)  Left Median Acr Palm Anti Sensory (2nd Digit)  32.9C  Wrist    *4.1 <3.6 27.8 >10 Wrist Palm  0.0    Palm *NR  <2.0          Right Median Acr  Palm Anti Sensory (2nd Digit)  32.5C  Wrist    *6.0 <3.6 *5.4 >10 Wrist Palm 4.3 0.0    Palm    *10.3 <2.0 11.1         Left Radial Anti Sensory (Base 1st Digit)  33.7C  Wrist    2.2 <3.1 18.7  Wrist Base 1st Digit 2.2 0.0    Right Radial Anti Sensory (Base 1st Digit)  32.5C  Wrist    2.5 <3.1 30.3  Wrist Base 1st Digit 2.5 0.0    Left Ulnar Anti Sensory (5th Digit)  33.3C  Wrist    3.6 <3.7 20.4 >15.0 Wrist 5th Digit 3.6 14.0 39 >38  Right Ulnar Anti Sensory (5th Digit)  32.7C  Wrist    *3.9 <3.7 24.3 >15.0 Wrist 5th Digit 3.9 14.0 *36 >38   Motor Summary Table   Stim Site NR Onset (ms) Norm Onset (ms) O-P Amp (mV) Norm O-P Amp Site1 Site2 Delta-0 (ms) Dist (cm) Vel (m/s) Norm Vel (m/s)  Left Median Motor (Abd Poll Brev)  33.2C  Wrist    3.4 <4.2 *4.5 >5 Elbow Wrist 3.6 18.0 50 >50  Elbow    7.0  4.7  Right Median Motor (Abd Poll Brev)  32.8C  Wrist    *4.5 <4.2 *3.8 >5 Elbow Wrist 5.0 19.5 *39 >50  Elbow    9.5  2.9         Left Ulnar Motor (Abd Dig Min)  32.5C  Wrist    3.0 <4.2 5.9 >3 B Elbow Wrist 3.4 18.0 53 >53  B Elbow    6.4  2.8  A Elbow B Elbow 1.2 10.0 83 >53  A Elbow    7.6  6.4         Right Ulnar Motor (Abd Dig Min)  32.3C  Wrist    3.4 <4.2 3.6 >3 B Elbow Wrist 3.1 17.0 55 >53  B Elbow    6.5  6.9  A Elbow B Elbow 1.2 10.0 83 >53  A Elbow    7.7  6.9          EMG   Side Muscle Nerve Root Ins Act Fibs Psw Amp Dur Poly Recrt Int Fraser Din Comment  Right 1stDorInt Ulnar C8-T1 Nml Nml Nml Nml Nml 0 Nml Nml   Right Abd Poll Brev Median C8-T1 Nml Nml Nml Nml Nml 0 Nml Nml   Right ExtDigCom   Nml Nml Nml Nml Nml 0 Nml Nml   Right Triceps Radial C6-7-8 Nml Nml Nml Nml Nml 0 Nml Nml   Right Deltoid Axillary C5-6 Nml Nml Nml Nml Nml 0 Nml Nml   Left 1stDorInt Ulnar C8-T1 Nml Nml Nml Nml Nml 0 Nml Nml   Left Abd Poll Brev Median C8-T1 Nml Nml Nml Nml Nml 0 Nml Nml   Left ExtDigCom   Nml Nml Nml Nml Nml 0 Nml Nml   Left Triceps Radial C6-7-8 Nml Nml Nml Nml Nml  0 Nml Nml   Left Deltoid Axillary C5-6 Nml Nml Nml Nml Nml 0 Nml Nml     Nerve Conduction Studies Anti Sensory Left/Right Comparison   Stim Site L Lat (ms) R Lat (ms) L-R Lat (ms) L Amp (V) R Amp (V) L-R Amp (%) Site1 Site2 L Vel (m/s) R Vel (m/s) L-R Vel (m/s)  Median Acr Palm Anti Sensory (2nd Digit)  32.9C  Wrist *4.1 *6.0 1.9 27.8 *5.4 80.6 Wrist Palm     Palm  *10.3   11.1        Radial Anti Sensory (Base 1st Digit)  33.7C  Wrist 2.2 2.5 0.3 18.7 30.3 38.3 Wrist Base 1st Digit     Ulnar Anti Sensory (5th Digit)  33.3C  Wrist 3.6 *3.9 0.3 20.4 24.3 16.0 Wrist 5th Digit 39 *36 3   Motor Left/Right Comparison   Stim Site L Lat (ms) R Lat (ms) L-R Lat (ms) L Amp (mV) R Amp (mV) L-R Amp (%) Site1 Site2 L Vel (m/s) R Vel (m/s) L-R Vel (m/s)  Median Motor (Abd Poll Brev)  33.2C  Wrist 3.4 *4.5 *1.1 *4.5 *3.8 15.6 Elbow Wrist 50 *39 *11  Elbow 7.0 9.5 2.5 4.7 2.9 38.3       Ulnar Motor (Abd Dig Min)  32.5C  Wrist 3.0 3.4 0.4 5.9 3.6 *39.0 B Elbow Wrist 53 55 2  B Elbow 6.4 6.5 0.1 2.8 6.9 59.4 A Elbow B Elbow 83 83 0  A Elbow 7.6 7.7 0.1 6.4 6.9 7.2          Waveforms:                      Clinical History: EMG/NCS 03/31/21  Impression: The above electrodiagnostic study is ABNORMAL and reveals evidence of:   1. a severe right median nerve entrapment at the wrist (carpal tunnel syndrome) affecting sensory and motor components.    2. a mild left median nerve entrapment at the wrist (carpal tunnel syndrome) affecting sensory components.   There is no significant electrodiagnostic evidence of any other focal nerve entrapment, brachial plexopathy or cervical radiculopathy.    Recommendations: 1.  Follow-up with referring physician. 2.  Continue current management of symptoms. 3.  Suggest surgical evaluation.   ___________________________ [name] Board Certified, American Board of Physical Medicine and Rehabilitation     Objective:  VS:  HT:     WT:   BMI:     BP:   HR: bpm  TEMP: ( )  RESP:  Physical Exam Vitals and nursing note reviewed.  Constitutional:      General: She is not in acute distress.    Appearance: Normal appearance. She is well-developed.  HENT:     Head: Normocephalic and atraumatic.     Nose: Nose normal.     Mouth/Throat:     Mouth: Mucous membranes are moist.     Pharynx: Oropharynx is clear.  Eyes:     Conjunctiva/sclera: Conjunctivae normal.     Pupils: Pupils are equal, round, and reactive to light.  Cardiovascular:     Rate and Rhythm: Regular rhythm.  Pulmonary:     Effort: Pulmonary effort is normal. No respiratory distress.  Abdominal:     General: There is no distension.     Palpations: Abdomen is soft.     Tenderness: There is no guarding.  Musculoskeletal:        General: No swelling, tenderness or deformity.     Cervical back: Normal range of motion and neck supple.     Right lower leg: No edema.     Left lower leg: No edema.     Comments: Inspection reveals asymmetric atrophy of the right APB more than left but clear atrophy of the bilateral FDI and hand intrinsics.  MCP joint showed some synovitis. There is no swelling, color changes, allodynia or dystrophic changes.  There is decreased strength with right APB more than left but also with finger abduction.. There is intact sensation to light touch in all dermatomal and peripheral nerve distributions. There is a positive Froment's test bilaterally. There is a negative Hoffmann's test bilaterally.  Skin:    General: Skin is warm and dry.     Findings: No erythema or rash.  Neurological:     General: No focal deficit present.     Mental Status: She is alert and oriented to person, place, and time.     Motor: No weakness or abnormal muscle tone.     Coordination: Coordination normal.     Gait: Gait normal.  Psychiatric:        Mood and Affect: Mood normal.        Behavior: Behavior normal.        Thought Content: Thought content  normal.      Imaging: No results found.

## 2022-10-06 ENCOUNTER — Ambulatory Visit
Admission: RE | Admit: 2022-10-06 | Discharge: 2022-10-06 | Disposition: A | Discharge status: Home / Self Care | Payer: Medicare PPO | Source: Ambulatory Visit | Attending: Orthopedic Surgery | Admitting: Orthopedic Surgery

## 2022-10-06 DIAGNOSIS — M542 Cervicalgia: Secondary | ICD-10-CM

## 2022-11-02 ENCOUNTER — Telehealth: Payer: Self-pay | Admitting: Orthopedic Surgery

## 2022-11-02 NOTE — Telephone Encounter (Signed)
Patient called asked if she can get the results of her MRI through [facility]? Patient said she do not have voicemail and it's hard to reach her. Patient wanted to know her next plan of care. The number to contact patient is [phone]

## 2022-11-08 ENCOUNTER — Encounter (HOSPITAL_COMMUNITY): Payer: Self-pay

## 2022-11-08 ENCOUNTER — Emergency Department (HOSPITAL_COMMUNITY): Payer: Medicare PPO

## 2022-11-08 ENCOUNTER — Inpatient Hospital Stay (HOSPITAL_COMMUNITY): Payer: Medicare PPO

## 2022-11-08 ENCOUNTER — Inpatient Hospital Stay (HOSPITAL_COMMUNITY)
Admission: EM | Admit: 2022-11-08 | Discharge: 2022-11-18 | DRG: 871 | Disposition: A | Discharge status: Nursing Home (Includes ICF) | Payer: Medicare PPO | Source: Skilled Nursing Facility | Attending: Internal Medicine | Admitting: Internal Medicine

## 2022-11-08 ENCOUNTER — Other Ambulatory Visit: Payer: Self-pay

## 2022-11-08 DIAGNOSIS — D84821 Immunodeficiency due to drugs: Secondary | ICD-10-CM | POA: Diagnosis present

## 2022-11-08 DIAGNOSIS — Z823 Family history of stroke: Secondary | ICD-10-CM

## 2022-11-08 DIAGNOSIS — K219 Gastro-esophageal reflux disease without esophagitis: Secondary | ICD-10-CM | POA: Diagnosis present

## 2022-11-08 DIAGNOSIS — Z79620 Long term (current) use of immunosuppressive biologic: Secondary | ICD-10-CM

## 2022-11-08 DIAGNOSIS — Z96653 Presence of artificial knee joint, bilateral: Secondary | ICD-10-CM | POA: Diagnosis present

## 2022-11-08 DIAGNOSIS — J449 Chronic obstructive pulmonary disease, unspecified: Secondary | ICD-10-CM | POA: Diagnosis present

## 2022-11-08 DIAGNOSIS — G9341 Metabolic encephalopathy: Secondary | ICD-10-CM | POA: Diagnosis present

## 2022-11-08 DIAGNOSIS — Z1152 Encounter for screening for COVID-19: Secondary | ICD-10-CM

## 2022-11-08 DIAGNOSIS — Z1612 Extended spectrum beta lactamase (ESBL) resistance: Secondary | ICD-10-CM | POA: Diagnosis present

## 2022-11-08 DIAGNOSIS — Z882 Allergy status to sulfonamides status: Secondary | ICD-10-CM | POA: Diagnosis not present

## 2022-11-08 DIAGNOSIS — Z79899 Other long term (current) drug therapy: Secondary | ICD-10-CM | POA: Diagnosis not present

## 2022-11-08 DIAGNOSIS — A419 Sepsis, unspecified organism: Secondary | ICD-10-CM | POA: Diagnosis present

## 2022-11-08 DIAGNOSIS — R509 Fever, unspecified: Secondary | ICD-10-CM

## 2022-11-08 DIAGNOSIS — Z833 Family history of diabetes mellitus: Secondary | ICD-10-CM

## 2022-11-08 DIAGNOSIS — T368X5A Adverse effect of other systemic antibiotics, initial encounter: Secondary | ICD-10-CM | POA: Diagnosis not present

## 2022-11-08 DIAGNOSIS — R7881 Bacteremia: Secondary | ICD-10-CM | POA: Diagnosis not present

## 2022-11-08 DIAGNOSIS — Z886 Allergy status to analgesic agent status: Secondary | ICD-10-CM | POA: Diagnosis not present

## 2022-11-08 DIAGNOSIS — Z7901 Long term (current) use of anticoagulants: Secondary | ICD-10-CM

## 2022-11-08 DIAGNOSIS — G8929 Other chronic pain: Secondary | ICD-10-CM | POA: Diagnosis present

## 2022-11-08 DIAGNOSIS — H409 Unspecified glaucoma: Secondary | ICD-10-CM | POA: Diagnosis present

## 2022-11-08 DIAGNOSIS — N3000 Acute cystitis without hematuria: Principal | ICD-10-CM

## 2022-11-08 DIAGNOSIS — D649 Anemia, unspecified: Secondary | ICD-10-CM | POA: Diagnosis present

## 2022-11-08 DIAGNOSIS — N390 Urinary tract infection, site not specified: Secondary | ICD-10-CM | POA: Diagnosis not present

## 2022-11-08 DIAGNOSIS — D869 Sarcoidosis, unspecified: Secondary | ICD-10-CM | POA: Diagnosis present

## 2022-11-08 DIAGNOSIS — N10 Acute tubulo-interstitial nephritis: Secondary | ICD-10-CM | POA: Diagnosis present

## 2022-11-08 DIAGNOSIS — R0600 Dyspnea, unspecified: Secondary | ICD-10-CM | POA: Diagnosis present

## 2022-11-08 DIAGNOSIS — Z8249 Family history of ischemic heart disease and other diseases of the circulatory system: Secondary | ICD-10-CM

## 2022-11-08 DIAGNOSIS — M069 Rheumatoid arthritis, unspecified: Secondary | ICD-10-CM | POA: Diagnosis present

## 2022-11-08 DIAGNOSIS — A4151 Sepsis due to Escherichia coli [E. coli]: Principal | ICD-10-CM | POA: Diagnosis present

## 2022-11-08 DIAGNOSIS — M25522 Pain in left elbow: Secondary | ICD-10-CM | POA: Diagnosis present

## 2022-11-08 DIAGNOSIS — T783XXA Angioneurotic edema, initial encounter: Secondary | ICD-10-CM | POA: Diagnosis not present

## 2022-11-08 DIAGNOSIS — R6520 Severe sepsis without septic shock: Secondary | ICD-10-CM | POA: Diagnosis present

## 2022-11-08 DIAGNOSIS — E669 Obesity, unspecified: Secondary | ICD-10-CM | POA: Diagnosis present

## 2022-11-08 DIAGNOSIS — A498 Other bacterial infections of unspecified site: Secondary | ICD-10-CM | POA: Diagnosis present

## 2022-11-08 DIAGNOSIS — Z6837 Body mass index (BMI) 37.0-37.9, adult: Secondary | ICD-10-CM

## 2022-11-08 DIAGNOSIS — R0602 Shortness of breath: Secondary | ICD-10-CM | POA: Diagnosis present

## 2022-11-08 LAB — URINALYSIS, ROUTINE W REFLEX MICROSCOPIC
Bilirubin Urine: NEGATIVE
Glucose, UA: NEGATIVE mg/dL
Ketones, ur: NEGATIVE mg/dL
Nitrite: POSITIVE — AB
Protein, ur: NEGATIVE mg/dL
Specific Gravity, Urine: 1.012 (ref 1.005–1.030)
pH: 7 (ref 5.0–8.0)

## 2022-11-08 LAB — CBC WITH DIFFERENTIAL/PLATELET
Abs Immature Granulocytes: 0.04 10*3/uL (ref 0.00–0.07)
Basophils Absolute: 0 10*3/uL (ref 0.0–0.1)
Basophils Relative: 0 %
Eosinophils Absolute: 0.1 10*3/uL (ref 0.0–0.5)
Eosinophils Relative: 1 %
HCT: 38.2 % (ref 36.0–46.0)
Hemoglobin: 11.8 g/dL — ABNORMAL LOW (ref 12.0–15.0)
Immature Granulocytes: 1 %
Lymphocytes Relative: 10 %
Lymphs Abs: 0.9 10*3/uL (ref 0.7–4.0)
MCH: 30.3 pg (ref 26.0–34.0)
MCHC: 30.9 g/dL (ref 30.0–36.0)
MCV: 97.9 fL (ref 80.0–100.0)
Monocytes Absolute: 0.7 10*3/uL (ref 0.1–1.0)
Monocytes Relative: 8 %
Neutro Abs: 7 10*3/uL (ref 1.7–7.7)
Neutrophils Relative %: 80 %
Platelets: 320 10*3/uL (ref 150–400)
RBC: 3.9 MIL/uL (ref 3.87–5.11)
RDW: 16.8 % — ABNORMAL HIGH (ref 11.5–15.5)
WBC: 8.7 10*3/uL (ref 4.0–10.5)
nRBC: 0 % (ref 0.0–0.2)

## 2022-11-08 LAB — COMPREHENSIVE METABOLIC PANEL
ALT: 26 U/L (ref 0–44)
AST: 37 U/L (ref 15–41)
Albumin: 3.8 g/dL (ref 3.5–5.0)
Alkaline Phosphatase: 87 U/L (ref 38–126)
Anion gap: 13 (ref 5–15)
BUN: 10 mg/dL (ref 8–23)
CO2: 23 mmol/L (ref 22–32)
Calcium: 8.7 mg/dL — ABNORMAL LOW (ref 8.9–10.3)
Chloride: 99 mmol/L (ref 98–111)
Creatinine, Ser: 0.69 mg/dL (ref 0.44–1.00)
GFR, Estimated: >60 mL/min (ref >60)
Glucose, Bld: 117 mg/dL — ABNORMAL HIGH (ref 70–99)
Potassium: 4.2 mmol/L (ref 3.5–5.1)
Sodium: 135 mmol/L (ref 135–145)
Total Bilirubin: 0.7 mg/dL (ref 0.3–1.2)
Total Protein: 7.2 g/dL (ref 6.5–8.1)

## 2022-11-08 LAB — PROTIME-INR
INR: 1.5 — ABNORMAL HIGH (ref 0.8–1.2)
Prothrombin Time: 17.5 seconds — ABNORMAL HIGH (ref 11.4–15.2)

## 2022-11-08 LAB — STREP PNEUMONIAE URINARY ANTIGEN: Strep Pneumo Urinary Antigen: NEGATIVE

## 2022-11-08 LAB — RESP PANEL BY RT-PCR (RSV, FLU A&B, COVID)  RVPGX2
Influenza A by PCR: NEGATIVE
Influenza B by PCR: NEGATIVE
Resp Syncytial Virus by PCR: NEGATIVE
SARS Coronavirus 2 by RT PCR: NEGATIVE

## 2022-11-08 LAB — LACTIC ACID, PLASMA
Lactic Acid, Venous: 1.6 mmol/L (ref 0.5–1.9)
Lactic Acid, Venous: 1.3 mmol/L (ref 0.5–1.9)

## 2022-11-08 MED ORDER — MOMETASONE FURO-FORMOTEROL FUM 200-5 MCG/ACT IN AERO
2.0000 | INHALATION_SPRAY | Freq: Two times a day (BID) | RESPIRATORY_TRACT | Status: DC
  Administered 2022-11-09 – 2022-11-18 (×19): 2 via RESPIRATORY_TRACT
  Filled 2022-11-08: qty 8.8

## 2022-11-08 MED ORDER — FLUTICASONE PROPIONATE 50 MCG/ACT NA SUSP
1.0000 | Freq: Every day | NASAL | Status: DC
  Administered 2022-11-09 – 2022-11-18 (×10): 1 via NASAL
  Filled 2022-11-08: qty 16

## 2022-11-08 MED ORDER — OXYCODONE HCL 5 MG PO TABS
10.0000 mg | ORAL_TABLET | ORAL | Status: DC | PRN
  Administered 2022-11-09: 10 mg via ORAL
  Filled 2022-11-08: qty 2

## 2022-11-08 MED ORDER — CYCLOSPORINE 0.05 % OP EMUL
1.0000 [drp] | Freq: Two times a day (BID) | OPHTHALMIC | Status: DC
  Administered 2022-11-09 – 2022-11-18 (×18): 1 [drp] via OPHTHALMIC
  Filled 2022-11-08 (×23): qty 30

## 2022-11-08 MED ORDER — VANCOMYCIN HCL 750 MG/150ML IV SOLN
750.0000 mg | INTRAVENOUS | Status: DC
  Administered 2022-11-09 – 2022-11-12 (×3): 750 mg via INTRAVENOUS
  Filled 2022-11-08 (×3): qty 150

## 2022-11-08 MED ORDER — SODIUM CHLORIDE 0.9 % IV SOLN
2.0000 g | Freq: Two times a day (BID) | INTRAVENOUS | Status: DC
  Administered 2022-11-09: 2 g via INTRAVENOUS
  Filled 2022-11-08: qty 12.5

## 2022-11-08 MED ORDER — ALBUTEROL SULFATE (2.5 MG/3ML) 0.083% IN NEBU
2.5000 mg | INHALATION_SOLUTION | RESPIRATORY_TRACT | Status: DC | PRN
  Administered 2022-11-11: 2.5 mg via RESPIRATORY_TRACT
  Filled 2022-11-08 (×2): qty 3

## 2022-11-08 MED ORDER — SODIUM CHLORIDE 0.9 % IV SOLN
2.0000 g | Freq: Once | INTRAVENOUS | Status: AC
  Administered 2022-11-08: 2 g via INTRAVENOUS
  Filled 2022-11-08: qty 20

## 2022-11-08 MED ORDER — OXYCODONE HCL ER 10 MG PO T12A
20.0000 mg | EXTENDED_RELEASE_TABLET | Freq: Two times a day (BID) | ORAL | Status: DC
  Administered 2022-11-08 – 2022-11-18 (×20): 20 mg via ORAL
  Filled 2022-11-08 (×19): qty 2
  Filled 2022-11-08: qty 1

## 2022-11-08 MED ORDER — DULOXETINE HCL 30 MG PO CPEP
30.0000 mg | ORAL_CAPSULE | Freq: Every day | ORAL | Status: DC
  Administered 2022-11-09 – 2022-11-18 (×10): 30 mg via ORAL
  Filled 2022-11-08 (×10): qty 1

## 2022-11-08 MED ORDER — OXYCODONE HCL ER 10 MG PO T12A: 20.0000 mg | EXTENDED_RELEASE_TABLET | Freq: Every day | ORAL | Status: DC | PRN

## 2022-11-08 MED ORDER — DOCUSATE SODIUM 100 MG PO CAPS
100.0000 mg | ORAL_CAPSULE | Freq: Every day | ORAL | Status: DC
  Administered 2022-11-09 – 2022-11-18 (×5): 100 mg via ORAL
  Filled 2022-11-08 (×8): qty 1

## 2022-11-08 MED ORDER — SENNOSIDES-DOCUSATE SODIUM 8.6-50 MG PO TABS
1.0000 | ORAL_TABLET | ORAL | Status: DC
  Filled 2022-11-08 (×5): qty 1

## 2022-11-08 MED ORDER — ALBUTEROL SULFATE (2.5 MG/3ML) 0.083% IN NEBU
5.0000 mg | INHALATION_SOLUTION | Freq: Once | RESPIRATORY_TRACT | Status: AC
  Administered 2022-11-08: 5 mg via RESPIRATORY_TRACT
  Filled 2022-11-08: qty 6

## 2022-11-08 MED ORDER — VANCOMYCIN HCL 1750 MG/350ML IV SOLN
1750.0000 mg | Freq: Once | INTRAVENOUS | Status: AC
  Administered 2022-11-08: 1750 mg via INTRAVENOUS
  Filled 2022-11-08: qty 350

## 2022-11-08 MED ORDER — RIVAROXABAN 10 MG PO TABS: 10.0000 mg | ORAL_TABLET | Freq: Every day | ORAL | Status: DC

## 2022-11-08 MED ORDER — POLYETHYLENE GLYCOL 3350 17 G PO PACK: 17.0000 g | PACK | Freq: Every day | ORAL | Status: DC | PRN

## 2022-11-08 MED ORDER — ACETAMINOPHEN 325 MG PO TABS
650.0000 mg | ORAL_TABLET | Freq: Once | ORAL | Status: AC
  Administered 2022-11-08: 650 mg via ORAL
  Filled 2022-11-08: qty 2

## 2022-11-08 MED ORDER — ACETAMINOPHEN 500 MG PO TABS
1000.0000 mg | ORAL_TABLET | Freq: Four times a day (QID) | ORAL | Status: DC | PRN
  Filled 2022-11-08: qty 2

## 2022-11-08 MED ORDER — GUAIFENESIN-DM 100-10 MG/5ML PO SYRP
10.0000 mL | ORAL_SOLUTION | Freq: Four times a day (QID) | ORAL | Status: DC | PRN
  Administered 2022-11-15 – 2022-11-17 (×3): 10 mL via ORAL
  Filled 2022-11-08 (×3): qty 10

## 2022-11-08 MED ORDER — CYCLOBENZAPRINE HCL 5 MG PO TABS
5.0000 mg | ORAL_TABLET | Freq: Three times a day (TID) | ORAL | Status: DC | PRN
  Administered 2022-11-10 – 2022-11-16 (×2): 5 mg via ORAL
  Filled 2022-11-08 (×4): qty 1

## 2022-11-08 MED ORDER — PSYLLIUM 95 % PO PACK
1.0000 | PACK | Freq: Every day | ORAL | Status: DC
  Administered 2022-11-09 – 2022-11-18 (×5): 1 via ORAL
  Filled 2022-11-08 (×7): qty 1

## 2022-11-08 MED ORDER — PANTOPRAZOLE SODIUM 40 MG PO TBEC
40.0000 mg | DELAYED_RELEASE_TABLET | Freq: Every day | ORAL | Status: DC
  Administered 2022-11-09 – 2022-11-18 (×10): 40 mg via ORAL
  Filled 2022-11-08 (×10): qty 1

## 2022-11-08 MED ORDER — IOHEXOL 350 MG/ML SOLN
75.0000 mL | Freq: Once | INTRAVENOUS | Status: AC | PRN
  Administered 2022-11-08: 75 mL via INTRAVENOUS

## 2022-11-08 MED ORDER — SODIUM CHLORIDE 0.9 % IV BOLUS
1000.0000 mL | Freq: Once | INTRAVENOUS | Status: AC
  Administered 2022-11-08: 1000 mL via INTRAVENOUS

## 2022-11-08 MED ORDER — ONDANSETRON 4 MG PO TBDP
8.0000 mg | ORAL_TABLET | Freq: Two times a day (BID) | ORAL | Status: DC | PRN
  Administered 2022-11-12: 8 mg via ORAL
  Filled 2022-11-08: qty 2

## 2022-11-08 MED ORDER — PSYLLIUM 0.52 G PO CAPS: 0.5200 g | ORAL_CAPSULE | Freq: Every day | ORAL | Status: DC

## 2022-11-08 MED ORDER — GABAPENTIN 300 MG PO CAPS
300.0000 mg | ORAL_CAPSULE | Freq: Two times a day (BID) | ORAL | Status: DC
  Administered 2022-11-08 – 2022-11-18 (×20): 300 mg via ORAL
  Filled 2022-11-08 (×20): qty 1

## 2022-11-08 NOTE — ED Triage Notes (Addendum)
Pt bib ems from [facility]; this am fever, increased generalized weakness; also c/o LA pain x 2 days, denies injury, denies CP; normally able to perform ADLs; 160/100, p 120, a and o x 4, T 103, CBG 135; capnography 40; 20 ga LH

## 2022-11-08 NOTE — ED Notes (Signed)
Leantin Bracks [phone]   Pt sister updated on care

## 2022-11-08 NOTE — Progress Notes (Signed)
Pharmacy Antibiotic Note  [name] is a 72 y.o. female for which pharmacy has been consulted for cefepime and vancomycin dosing for UTI and concern for septic arthritis . Patient with a history of sarcoidosis, RA on Humira and methotrexate.  SCr 0.69 WBC 8.7; LA 1.6; T 100.3; HR 122; RR 34 COVID neg / flu neg  Plan: Cefepime 2g q12hr  Vancomycin 1750 mg once then 750 mg q24hr (eAUC 440.4) unless change in renal function Trend WBC, Fever, Renal function F/u cultures, clinical course, WBC De-escalate when able Levels at steady state  Height: 5' (152.4 cm) Weight: 81.6 kg (180 lb) IBW/kg (Calculated) : 45.5  Temp (24hrs), Avg:100.7 F (38.2 C), Min:100.2 F (37.9 C), Max:101.7 F (38.7 C)  Recent Labs  Lab 11/08/22 1500 11/08/22 1838  WBC 8.7  --   CREATININE 0.69  --   LATICACIDVEN 1.6 1.3    Estimated Creatinine Clearance: 59.2 mL/min (by C-G formula based on SCr of 0.69 mg/dL).    Allergies  Allergen Reactions   Ibuprofen Rash   Sulfa Antibiotics Rash   Microbiology results: Pending  Thank you for allowing pharmacy to be a part of this patient's care.  Lorelei Pont, PharmD, BCPS 11/08/2022 9:16 PM ED Clinical Pharmacist -  [phone]

## 2022-11-08 NOTE — ED Notes (Signed)
ED TO INPATIENT HANDOFF REPORT  ED Nurse Name and Phone #: [phone]  S Name/Age/Gender [name] 72 y.o. female Room/Bed: 015C/015C  Code Status   Code Status: Full Code  Home/SNF/Other Home Patient oriented to: self, place, time, and situation Is this baseline? Yes   Triage Complete: Triage complete  Chief Complaint Dyspnea [R06.00]  Triage Note Pt bib ems from [facility]; this am fever, increased generalized weakness; also c/o LA pain x 2 days, denies injury, denies CP; normally able to perform ADLs; 160/100, p 120, a and o x 4, T 103, CBG 135; capnography 40; 20 ga LH   Allergies Allergies  Allergen Reactions   Ibuprofen Rash   Sulfa Antibiotics Rash    Level of Care/Admitting Diagnosis ED Disposition     ED Disposition  Admit   Condition  --   [place]: [facility] [100100]  Level of Care: Telemetry Cardiac [103]  May admit patient to [facility] or [facility] if equivalent level of care is available:: Yes  Covid Evaluation: Confirmed COVID Negative  Diagnosis: Dyspnea [010272]  Admitting Physician: Lorelei Pont [[id number]]  Attending Physician: Lorelei Pont [[id number]]  Certification:: I certify this patient will need inpatient services for at least 2 midnights  Estimated Length of Stay: 2          B Medical/Surgery History Past Medical History:  Diagnosis Date   Acute bronchitis    Allergy    Anxiety    Arthritis    knees   Asthma    Backache, unspecified    Cataract    removed both eyes   Depression    Diverticulitis    Diverticulosis of colon (without mention of hemorrhage)    Edema    GERD (gastroesophageal reflux disease)    Glaucoma    Lumbago    Obesity    Other chest pain    Pain in joint, ankle and foot    Sarcoidosis    Tibialis tendinitis    Unspecified asthma(493.90)    Past Surgical History:  Procedure Laterality Date   ABDOMINAL HYSTERECTOMY     CATARACT  EXTRACTION W/PHACO Right 06/24/2014   Procedure: CATARACT EXTRACTION PHACO AND INTRAOCULAR LENS PLACEMENT ([place]) RIGHT EYE WITH GONIOSYNECHIALYSIS;  Surgeon: Marylynn Pearson, MD;  Location: [facility];  Service: Ophthalmology;  Laterality: Right;   COLONOSCOPY     EYE SURGERY Left    cataract surgery   HAND SURGERY Right    trigger finger release   IR RADIOLOGIST EVAL & MGMT  11/14/2018   KNEE ARTHROSCOPY Right    TOTAL KNEE ARTHROPLASTY Right 12/08/2021   Procedure: RIGHT TOTAL KNEE ARTHROPLASTY APPLICATION OF WOUND VAC;  Surgeon: Meredith Pel, MD;  Location: [facility];  Service: Orthopedics;  Laterality: Right;   TOTAL KNEE ARTHROPLASTY Left 04/13/2022   Procedure: LEFT TOTAL KNEE ARTHROPLASTY;  Surgeon: Meredith Pel, MD;  Location: [facility];  Service: Orthopedics;  Laterality: Left;     A IV Location/Drains/Wounds Patient Lines/Drains/Airways Status     Active Line/Drains/Airways     Name Placement date Placement time Site Days   Peripheral IV 11/08/22 20 G Left;Posterior Hand 11/08/22  --  Hand  less than 1   Negative Pressure Wound Therapy Knee Left 04/13/22  1901  --  209            Intake/Output Last 24 hours No intake or output data in the 24 hours ending 11/08/22 2141  Labs/Imaging Results for orders placed or performed during the hospital encounter of 11/08/22 (from the past 48 hour(s))  Comprehensive metabolic panel     Status: Abnormal   Collection Time: 11/08/22  3:00 PM  Result Value Ref Range   Sodium 135 135 - 145 mmol/L   Potassium 4.2 3.5 - 5.1 mmol/L   Chloride 99 98 - 111 mmol/L   CO2 23 22 - 32 mmol/L   Glucose, Bld 117 (H) 70 - 99 mg/dL    Comment: Glucose reference range applies only to samples taken after fasting for at least 8 hours.   BUN 10 8 - 23 mg/dL   Creatinine, Ser 0.69 0.44 - 1.00 mg/dL   Calcium 8.7 (L) 8.9 - 10.3 mg/dL   Total Protein 7.2 6.5 - 8.1 g/dL   Albumin 3.8 3.5 - 5.0 g/dL   AST 37 15 - 41 U/L   ALT 26 0 - 44 U/L   Alkaline  Phosphatase 87 38 - 126 U/L   Total Bilirubin 0.7 0.3 - 1.2 mg/dL   GFR, Estimated >60 >60 mL/min    Comment: (NOTE) Calculated using the CKD-EPI Creatinine Equation (2021)    Anion gap 13 5 - 15    Comment: Performed at [facility] [address] [place], [place] [address]  Lactic acid, plasma     Status: None   Collection Time: 11/08/22  3:00 PM  Result Value Ref Range   Lactic Acid, Venous 1.6 0.5 - 1.9 mmol/L    Comment: Performed at [facility] [address] [place], [place] [address]  CBC with Differential     Status: Abnormal   Collection Time: 11/08/22  3:00 PM  Result Value Ref Range   WBC 8.7 4.0 - 10.5 K/uL   RBC 3.90 3.87 - 5.11 MIL/uL   Hemoglobin 11.8 (L) 12.0 - 15.0 g/dL   HCT 38.2 36.0 - 46.0 %   MCV 97.9 80.0 - 100.0 fL   MCH 30.3 26.0 - 34.0 pg   MCHC 30.9 30.0 - 36.0 g/dL   RDW 16.8 (H) 11.5 - 15.5 %   Platelets 320 150 - 400 K/uL   nRBC 0.0 0.0 - 0.2 %   Neutrophils Relative % 80 %   Neutro Abs 7.0 1.7 - 7.7 K/uL   Lymphocytes Relative 10 %   Lymphs Abs 0.9 0.7 - 4.0 K/uL   Monocytes Relative 8 %   Monocytes Absolute 0.7 0.1 - 1.0 K/uL   Eosinophils Relative 1 %   Eosinophils Absolute 0.1 0.0 - 0.5 K/uL   Basophils Relative 0 %   Basophils Absolute 0.0 0.0 - 0.1 K/uL   Immature Granulocytes 1 %   Abs Immature Granulocytes 0.04 0.00 - 0.07 K/uL    Comment: Performed at [facility] [address] [place], [place] [address]  Protime-INR     Status: Abnormal   Collection Time: 11/08/22  3:00 PM  Result Value Ref Range   Prothrombin Time 17.5 (H) 11.4 - 15.2 seconds   INR 1.5 (H) 0.8 - 1.2    Comment: (NOTE) INR goal varies based on device and disease states. Performed at [facility], [place] [address], [place],  [address]   Culture, blood (Routine x 2)     Status: None (Preliminary result)   Collection Time: 11/08/22  3:07 PM   Specimen: BLOOD RIGHT FOREARM  Result Value Ref Range   Specimen Description BLOOD  RIGHT FOREARM  Special Requests      BOTTLES DRAWN AEROBIC AND ANAEROBIC Blood Culture adequate volume Performed at [facility], [place] [address], [place], [place] [address]    Culture PENDING    Report Status PENDING   Resp panel by RT-PCR (RSV, Flu A&B, Covid) Anterior Nasal Swab     Status: None   Collection Time: 11/08/22  3:12 PM   Specimen: Anterior Nasal Swab  Result Value Ref Range   SARS Coronavirus 2 by RT PCR NEGATIVE NEGATIVE   Influenza A by PCR NEGATIVE NEGATIVE   Influenza B by PCR NEGATIVE NEGATIVE    Comment: (NOTE) The Xpert Xpress SARS-CoV-2/FLU/RSV plus assay is intended as an aid in the diagnosis of influenza from Nasopharyngeal swab specimens and should not be used as a sole basis for treatment. Nasal washings and aspirates are unacceptable for Xpert Xpress SARS-CoV-2/FLU/RSV testing.  Fact Sheet for Patients: EntrepreneurPulse.com.au  Fact Sheet for Healthcare Providers: IncredibleEmployment.be  This test is not yet approved or cleared by the [place] FDA and has been authorized for detection and/or diagnosis of SARS-CoV-2 by FDA under an Emergency Use Authorization (EUA). This EUA will remain in effect (meaning this test can be used) for the duration of the COVID-19 declaration under Section 564(b)(1) of the Act, 21 U.S.C. section 360bbb-3(b)(1), unless the authorization is terminated or revoked.     Resp Syncytial Virus by PCR NEGATIVE NEGATIVE    Comment: (NOTE) Fact Sheet for Patients: EntrepreneurPulse.com.au  Fact Sheet for Healthcare Providers: IncredibleEmployment.be  This test is not yet approved or cleared by the [place] FDA and has been authorized for detection and/or diagnosis of SARS-CoV-2 by FDA under an Emergency Use Authorization (EUA). This EUA will remain in effect (meaning this test can be used) for the duration of the COVID-19 declaration  under Section 564(b)(1) of the Act, 21 U.S.C. section 360bbb-3(b)(1), unless the authorization is terminated or revoked.  Performed at [facility], [place] [address] [place], [place] [address]   Lactic acid, plasma     Status: None   Collection Time: 11/08/22  6:38 PM  Result Value Ref Range   Lactic Acid, Venous 1.3 0.5 - 1.9 mmol/L    Comment: Performed at [facility] [address], [place], [place] [address]  Urinalysis, Routine w reflex microscopic -Urine, Clean Catch     Status: Abnormal   Collection Time: 11/08/22  7:59 PM  Result Value Ref Range   Color, Urine YELLOW YELLOW   APPearance CLEAR CLEAR   Specific Gravity, Urine 1.012 1.005 - 1.030   pH 7.0 5.0 - 8.0   Glucose, UA NEGATIVE NEGATIVE mg/dL   Hgb urine dipstick SMALL (A) NEGATIVE   Bilirubin Urine NEGATIVE NEGATIVE   Ketones, ur NEGATIVE NEGATIVE mg/dL   Protein, ur NEGATIVE NEGATIVE mg/dL   Nitrite POSITIVE (A) NEGATIVE   Leukocytes,Ua SMALL (A) NEGATIVE   RBC / HPF 6-10 0 - 5 RBC/hpf   WBC, UA 11-20 0 - 5 WBC/hpf   Bacteria, UA MANY (A) NONE SEEN   Squamous Epithelial / HPF 0-5 0 - 5 /HPF    Comment: Performed at [facility], [address], [place], [place] [address]   DG Elbow 2 Views Left  Result Date: 11/08/2022 CLINICAL DATA:  Pain, no known injury EXAM: LEFT ELBOW - 2 VIEW COMPARISON:  None Available. 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 Joint effusion present. Bone fragments are noted anteriorly  on the lateral view. Cannot exclude avulsed fragments. No subluxation or dislocation. IMPRESSION: Advanced osteoarthritis. Left elbow joint effusion. Curvilinear bone fragments anteriorly on the lateral view, cannot exclude avulsed fragments. Electronically Signed   By: Rolm Baptise M.D.   On: 11/08/2022 21:15   DG Chest 2 View  Result Date: 11/08/2022 CLINICAL DATA:  Sepsis and shortness of breath. EXAM: CHEST - 2 VIEW COMPARISON:   06/02/2022 FINDINGS: The heart size and mediastinal contours are within normal limits. There is no evidence of pulmonary edema, consolidation, pneumothorax, nodule or pleural fluid. The visualized skeletal structures are unremarkable. IMPRESSION: No active cardiopulmonary disease. Electronically Signed   By: Aletta Edouard M.D.   On: 11/08/2022 15:35    Pending Labs Unresulted Labs (From admission, onward)     Start     Ordered   11/09/22 0500  Comprehensive metabolic panel  Tomorrow morning,   R        11/08/22 2116   11/09/22 0500  CBC  Tomorrow morning,   R        11/08/22 2116   11/08/22 2111  TSH  Once,   URGENT        11/08/22 2110   11/08/22 2110  Procalcitonin - Baseline  ONCE - URGENT,   URGENT        11/08/22 2109   11/08/22 2109  Legionella Pneumophila Serogp 1 Ur Ag  Once,   URGENT        11/08/22 2108   11/08/22 2109  Strep pneumoniae urinary antigen  Once,   URGENT        11/08/22 2108   11/08/22 2027  Urine Culture (for pregnant, neutropenic or urologic patients or patients with an indwelling urinary catheter)  (Urine Labs)  Once,   URGENT       Question:  Indication  Answer:  Sepsis   11/08/22 2026   11/08/22 1507  Culture, blood (Routine x 2)  BLOOD CULTURE X 2,   R      11/08/22 1506            Vitals/Pain Today's Vitals   11/08/22 1916 11/08/22 1930 11/08/22 1945 11/08/22 2000  BP:  (!) 159/86 (!) 156/87   Pulse:  (!) 115 (!) 122   Resp:  (!) 34 (!) 34   Temp:    100.3 F (37.9 C)  TempSrc:    Oral  SpO2:  97% 95%   Weight:      Height:      PainSc: 3        Isolation Precautions No active isolations  Medications Medications  ceFEPIme (MAXIPIME) 2 g in sodium chloride 0.9 % 100 mL IVPB (has no administration in time range)  vancomycin (VANCOREADY) IVPB 1750 mg/350 mL (has no administration in time range)  acetaminophen (TYLENOL) tablet 1,000 mg (has no administration in time range)  oxyCODONE (Oxy IR/ROXICODONE) immediate release tablet 10 mg  (has no administration in time range)  DULoxetine (CYMBALTA) DR capsule 30 mg (has no administration in time range)  docusate sodium (COLACE) capsule 100 mg (has no administration in time range)  pantoprazole (PROTONIX) EC tablet 40 mg (has no administration in time range)  ondansetron (ZOFRAN-ODT) disintegrating tablet 8 mg (has no administration in time range)  polyethylene glycol (MIRALAX / GLYCOLAX) packet 17 g (has no administration in time range)  psyllium (REGULOID) capsule 0.52 g (has no administration in time range)  senna-docusate (Senokot-S) tablet 1 tablet (has no administration in time range)  rivaroxaban (XARELTO)  tablet 10 mg (has no administration in time range)  cyclobenzaprine (FLEXERIL) tablet 5 mg (has no administration in time range)  gabapentin (NEURONTIN) capsule 300 mg (has no administration in time range)  albuterol (PROVENTIL) (2.5 MG/3ML) 0.083% nebulizer solution 2.5 mg (has no administration in time range)  fluticasone (FLONASE) 50 MCG/ACT nasal spray 1 spray (has no administration in time range)  guaiFENesin-dextromethorphan (ROBITUSSIN DM) 100-10 MG/5ML syrup 10 mL (has no administration in time range)  mometasone-formoterol (DULERA) 200-5 MCG/ACT inhaler 2 puff (has no administration in time range)  cycloSPORINE (RESTASIS) 0.05 % ophthalmic emulsion 1 drop (has no administration in time range)  oxyCODONE (OXYCONTIN) 12 hr tablet 20 mg (has no administration in time range)  acetaminophen (TYLENOL) tablet 650 mg (650 mg Oral Given 11/08/22 1537)  albuterol (PROVENTIL) (2.5 MG/3ML) 0.083% nebulizer solution 5 mg (5 mg Nebulization Given 11/08/22 1539)  sodium chloride 0.9 % bolus 1,000 mL (1,000 mLs Intravenous New Bag/Given 11/08/22 1915)  cefTRIAXone (ROCEPHIN) 2 g in sodium chloride 0.9 % 100 mL IVPB (0 g Intravenous Stopped 11/08/22 1943)  sodium chloride 0.9 % bolus 1,000 mL (0 mLs Intravenous Stopped 11/08/22 2039)  acetaminophen (TYLENOL) tablet 650 mg (650 mg Oral  Given 11/08/22 1958)    Mobility walks with device     Focused Assessments Pulmonary Assessment Handoff:  Lung sounds: L Breath Sounds: Expiratory wheezes R Breath Sounds: Expiratory wheezes O2 Device: Room Air      R Recommendations: See Admitting Provider Note  Report given to:   Additional Notes:

## 2022-11-08 NOTE — H&P (Addendum)
History and Physical    Patient: [name] HBZ:[id number] DOB: [birth year] DOA: 11/08/2022 DOS: the patient was seen and examined on 11/08/2022 PCP: [name], MD  Patient coming from: Home  Chief Complaint:  Chief Complaint  Patient presents with   Weakness   Fever   HPI: [name] is a 72 y.o. female with medical history significant of rheumatoid arthritis, GERD, asthma, diverticulosis, obesity, sarcoidosis not on treatment, here with fevers and weakness.  History limited secondary to poor memory.Reports fevers, does not remember when today, but was measured today. She thinks it was up to 103. She reports SOB, she does not remember when it started but thinks it was a few days. She denies cough. She reports nasal congestion.   No chest pain or chest pressure, LE edema. RA on humeria and methotrexate, took [place] today.  Pt reports left elbow pain, chronic but seems slightly worse today.  She is not sure why she is on a blood thinner.  Review of Systems: As mentioned in the history of present illness. All other systems reviewed and are negative. Past Medical History:  Diagnosis Date   Acute bronchitis    Allergy    Anxiety    Arthritis    knees   Asthma    Backache, unspecified    Cataract    removed both eyes   Depression    Diverticulitis    Diverticulosis of colon (without mention of hemorrhage)    Edema    GERD (gastroesophageal reflux disease)    Glaucoma    Lumbago    Obesity    Other chest pain    Pain in joint, ankle and foot    Sarcoidosis    Tibialis tendinitis    Unspecified asthma(493.90)    Past Surgical History:  Procedure Laterality Date   ABDOMINAL HYSTERECTOMY     CATARACT EXTRACTION W/PHACO Right 06/24/2014   Procedure: CATARACT EXTRACTION PHACO AND INTRAOCULAR LENS PLACEMENT (IOC) RIGHT EYE WITH GONIOSYNECHIALYSIS;  Surgeon: Marylynn Pearson, MD;  Location: [facility];  Service: Ophthalmology;  Laterality: Right;   COLONOSCOPY     EYE  SURGERY Left    cataract surgery   HAND SURGERY Right    trigger finger release   IR RADIOLOGIST EVAL & MGMT  11/14/2018   KNEE ARTHROSCOPY Right    TOTAL KNEE ARTHROPLASTY Right 12/08/2021   Procedure: RIGHT TOTAL KNEE ARTHROPLASTY APPLICATION OF WOUND VAC;  Surgeon: Meredith Pel, MD;  Location: [facility];  Service: Orthopedics;  Laterality: Right;   TOTAL KNEE ARTHROPLASTY Left 04/13/2022   Procedure: LEFT TOTAL KNEE ARTHROPLASTY;  Surgeon: Meredith Pel, MD;  Location: [facility];  Service: Orthopedics;  Laterality: Left;   Social History:  reports that she has never smoked. She has never used smokeless tobacco. She reports current alcohol use. She reports that she does not use drugs.  Allergies  Allergen Reactions   Ibuprofen Rash   Sulfa Antibiotics Rash    Family History  Problem Relation Age of Onset   Hypertension Mother    Stroke Mother    Diabetes Father    Sarcoidosis Sister    Colon cancer Neg Hx    Colon polyps Neg Hx    Esophageal cancer Neg Hx    Rectal cancer Neg Hx    Stomach cancer Neg Hx     Prior to Admission medications   Medication Sig Start Date End Date Taking? Authorizing Provider  acetaminophen (TYLENOL) 500 MG tablet Take 1,000 mg by  mouth in the morning and at bedtime.    [provider]  albuterol (VENTOLIN HFA) 108 (90 Base) MCG/ACT inhaler TAKE 2 PUFFS BY MOUTH EVERY 6 HOURS AS NEEDED 06/26/22   [name], MD  cyclobenzaprine (FLEXERIL) 5 MG tablet Take 5 mg by mouth every 8 (eight) hours as needed for muscle spasms. 10/13/21   [provider]  cycloSPORINE (RESTASIS) 0.05 % ophthalmic emulsion Place 1 drop into both eyes 2 (two) times daily.    [provider]  docusate sodium (COLACE) 100 MG capsule Take 100 mg by mouth daily.    [provider]  DULoxetine (CYMBALTA) 30 MG capsule Take 30 mg by mouth daily. 10/26/21   [provider]  fluticasone (FLONASE) 50 MCG/ACT nasal spray Place 1 spray  into both nostrils daily. 08/17/21   [name], MD  gabapentin (NEURONTIN) 300 MG capsule Take 300 mg by mouth 2 (two) times daily.    [provider]  guaiFENesin-dextromethorphan (ROBITUSSIN DM) 100-10 MG/5ML syrup Take 10 mLs by mouth every 6 (six) hours as needed for cough.    [provider]  levocetirizine (XYZAL ALLERGY 24HR) 5 MG tablet Take 1 tablet (5 mg total) by mouth every evening. Patient taking differently: Take 5 mg by mouth daily. 08/17/21   [name], MD  Menthol, Topical Analgesic, (BIOFREEZE) 4 % GEL Apply 1 Application topically in the morning, at noon, and at bedtime. Apply to left knee and both wrist    [provider]  methotrexate (RHEUMATREX) 2.5 MG tablet Take 10 mg by mouth once a week. 06/13/22   [provider]  omeprazole (PRILOSEC) 20 MG capsule Take 20 mg by mouth in the morning and at bedtime.    [provider]  ondansetron (ZOFRAN-ODT) 8 MG disintegrating tablet Take 8 mg by mouth 2 (two) times daily as needed for vomiting or nausea. 10/26/21   [provider]  oxyCODONE (OXYCONTIN) 20 mg 12 hr tablet Take 20 mg by mouth daily as needed (osteoarthritis).    [provider]  oxyCODONE 10 MG TABS Take 1 tablet (10 mg total) by mouth every 4 (four) hours as needed for severe pain. 12/14/21   Magnant, Charles L, PA-C  polyethylene glycol powder (GLYCOLAX/MIRALAX) 17 GM/SCOOP powder Take 17 g by mouth daily as needed. Patient taking differently: Take 17 g by mouth daily as needed for mild constipation. 08/17/21   [name], MD  psyllium (REGULOID) 0.52 g capsule Take 0.52 g by mouth at bedtime.    [provider]  rivaroxaban (XARELTO) 10 MG TABS tablet Take 1 tablet (10 mg total) by mouth daily. 04/17/22   Magnant, Gerrianne Scale, PA-C  sennosides-docusate sodium (SENOKOT-S) 8.6-50 MG tablet Take 1 tablet by mouth every Monday, Wednesday, and Friday at 8 PM.    [provider]   sodium chloride (OCEAN) 0.65 % nasal spray Place 1 spray into the nose as needed (allergy symptoms including congestion and drainage). 06/26/22   [name], MD  Vitamin D, Ergocalciferol, (DRISDOL) 1.25 MG (50000 UNIT) CAPS capsule Take 1 capsule (50,000 Units total) by mouth every 7 (seven) days. Patient taking differently: Take 50,000 Units by mouth every Friday. 05/26/21   [name], MD  Grant Ruts INHUB 250-50 MCG/ACT AEPB INHALE 1 PUFF BY MOUTH TWICE A DAY 06/07/21   [name], MD    Physical Exam: Vitals:   11/08/22 1811 11/08/22 1930 11/08/22 1945 11/08/22 2000  BP: (!) 147/72 Marland Kitchen)  159/86 (!) 156/87   Pulse: (!) 121 (!) 115 (!) 122   Resp: (!) 28 (!) 34 (!) 34   Temp: (!) 101.7 F (38.7 C)   100.3 F (37.9 C)  TempSrc: Rectal   Oral  SpO2: 96% 97% 95%   Weight:      Height:       Physical Exam Vitals and nursing note reviewed.  Constitutional:      Appearance: Normal appearance. She is not ill-appearing.  HENT:     Head: Normocephalic and atraumatic.     Mouth/Throat:     Mouth: Mucous membranes are moist.  Cardiovascular:     Rate and Rhythm: Normal rate and regular rhythm.  Abdominal:     General: Abdomen is flat. There is no distension.     Palpations: Abdomen is soft. There is no mass.     Tenderness: There is no abdominal tenderness.  Musculoskeletal:     Left elbow: No swelling or effusion. Decreased range of motion. Tenderness present.     Right lower leg: No edema.     Left lower leg: No edema.  Skin:    General: Skin is warm and dry.     Capillary Refill: Capillary refill takes less than 2 seconds.  Neurological:     Mental Status: She is alert.  Psychiatric:        Mood and Affect: Mood normal.        Behavior: Behavior normal.     Data Reviewed:     Latest Ref Rng & Units 11/08/2022    3:00 PM 06/26/2022   11:24 AM 04/16/2022    7:32 PM  CBC  WBC 4.0 - 10.5 K/uL 8.7  9.7    Hemoglobin 12.0 - 15.0 g/dL 11.8  11.2  9.3   Hematocrit  36.0 - 46.0 % 38.2  34.6  29.5   Platelets 150 - 400 K/uL 320  364.0        Latest Ref Rng & Units 11/08/2022    3:00 PM 06/26/2022   11:24 AM 04/10/2022    2:44 PM  CMP  Glucose 70 - 99 mg/dL 117  93  100   BUN 8 - 23 mg/dL '10  10  7   '$ Creatinine 0.44 - 1.00 mg/dL 0.69  0.42  0.45   Sodium 135 - 145 mmol/L 135  140  136   Potassium 3.5 - 5.1 mmol/L 4.2  4.1  3.8   Chloride 98 - 111 mmol/L 99  104  101   CO2 22 - 32 mmol/L '23  27  25   '$ Calcium 8.9 - 10.3 mg/dL 8.7  8.9  8.8   Total Protein 6.5 - 8.1 g/dL 7.2  7.0    Total Bilirubin 0.3 - 1.2 mg/dL 0.7  0.3    Alkaline Phos 38 - 126 U/L 87  101    AST 15 - 41 U/L 37  10    ALT 0 - 44 U/L 26  10     UA with positive nitrites, leukocyte Estrace, many bacteria Blood culture no growth to date Lactate normal  Assessment and Plan: Ashyia Schraeder Farooqui is a 72 y.o. female with medical history significant of rheumatoid arthritis, GERD, asthma, diverticulosis, obesity, sarcoidosis not on treatment, here with fevers and weakness.  Sepsis UTI Patient present with fever, tachycardia, tachypnea and weakness.  Her UA positive for nitrates, leukocyte esterase and many bacteria.  Culture is pending.  Patient is immunosupporesed on Humira and methotrexate.  Other workup for infection including respiratory source has been negative so far.  Will obtain CT imaging of abdomen pelvis for possible complicated UTI, she does not have CVA tenderness on exam.  As below, patient has left elbow pain that she reports is chronic but seems worse today. Will empirically cover for septic arthritis as well. - CT abdomen pelvis given fever and immunosuppression - Urine culture, blood culture pending, urine strep, urine Legionella, procalcitonin - Status post Rocephin in the ED, broadened to cefepime, vancomycin - If CT does not reveal etiology for fever may consider consult for joint aspiration of left elbow  Left elbow pain She is not best historian.  She reports chronic  left elbow pain but seems worse today.  She says she was supposed to orthopedic surgeon regarding her left elbow but do not see any documented notes regarding this. On exam she does have tenderness to palpation but no obvious effusion in her left elbow.  She is on Humira and will cover empirically for septic arthritis as well. - Left elbow x-ray - Antibiotics as above  Hx of RA Arthritis -hold methothrexate -s/p Humira dose today she reports -Continue oxycodone 12-hour tablet 20 mg every 12 hours, oxycodone 10 mg every 4 hours as needed for severe pain -Gabapentin 300 mg twice daily -Cymbalta 30 mg daily -Bowel regimen Colace 100 mg daily, miralax prn, psyllium, senna-colace MWF  Hx of b/l knee replacement Pt does not know if she is on xarelto. Listed on home med as DVT prophylaxis. -Follow-up on pharmacy consult regarding Xarelto  COPD -Continue home Dulera  GERD -PPI  Normocytic anemia S/p 2 L fluids.  -partially dilutional, continue monitor  Advance Care Planning: Full DVT ppx: holding xarelto Consults: None Family Communication: d/w sister via phone  Severity of Illness: The appropriate patient status for this patient is INPATIENT. Inpatient status is judged to be reasonable and necessary in order to provide the required intensity of service to ensure the patient's safety. The patient's presenting symptoms, physical exam findings, and initial radiographic and laboratory data in the context of their chronic comorbidities is felt to place them at high risk for further clinical deterioration. Furthermore, it is not anticipated that the patient will be medically stable for discharge from the hospital within 2 midnights of admission.   * I certify that at the point of admission it is my clinical judgment that the patient will require inpatient hospital care spanning beyond 2 midnights from the point of admission due to high intensity of service, high risk for further deterioration  and high frequency of surveillance required.*  Author: Lorelei Pont, MD 11/08/2022 8:45 PM  For on call review [email or web address].

## 2022-11-08 NOTE — ED Provider Notes (Signed)
[facility] Provider Note   CSN: [id number] Arrival date & time: 11/08/22  1446     History  Chief Complaint  Patient presents with   Weakness   Fever    [name] is a 72 y.o. female.  Patient with history of sarcoidosis, history of rheumatoid arthritis currently on Humira and methotrexate, history of anticoagulation --presents to the emergency department today [facility] for generalized weakness and fever.  Patient states that she started feeling poorly this morning.  She denies associated ear pain, runny nose, sore throat, cough.  She denies nausea, vomiting, or diarrhea.  She denies dysuria, increased frequency or urgency or hematuria.  No chest pain or shortness of breath.  No abdominal pain.  She complains of ongoing pain in her left upper extremity, centralized around her elbow.  She states that she had an MRI of her neck which was performed a month ago.  She cannot tell me results, however in epic demonstrates several areas of arthritis.       Home Medications Prior to Admission medications   Medication Sig Start Date End Date Taking? Authorizing Provider  acetaminophen (TYLENOL) 500 MG tablet Take 1,000 mg by mouth in the morning and at bedtime.    [provider]  albuterol (VENTOLIN HFA) 108 (90 Base) MCG/ACT inhaler TAKE 2 PUFFS BY MOUTH EVERY 6 HOURS AS NEEDED 06/26/22   [name], MD  cyclobenzaprine (FLEXERIL) 5 MG tablet Take 5 mg by mouth every 8 (eight) hours as needed for muscle spasms. 10/13/21   [provider]  cycloSPORINE (RESTASIS) 0.05 % ophthalmic emulsion Place 1 drop into both eyes 2 (two) times daily.    [provider]  docusate sodium (COLACE) 100 MG capsule Take 100 mg by mouth daily.    [provider]  DULoxetine (CYMBALTA) 30 MG capsule Take 30 mg by mouth daily. 10/26/21   [provider]  fluticasone (FLONASE) 50 MCG/ACT nasal  spray Place 1 spray into both nostrils daily. 08/17/21   [name], MD  gabapentin (NEURONTIN) 300 MG capsule Take 300 mg by mouth 2 (two) times daily.    [provider]  guaiFENesin-dextromethorphan (ROBITUSSIN DM) 100-10 MG/5ML syrup Take 10 mLs by mouth every 6 (six) hours as needed for cough.    [provider]  levocetirizine (XYZAL ALLERGY 24HR) 5 MG tablet Take 1 tablet (5 mg total) by mouth every evening. Patient taking differently: Take 5 mg by mouth daily. 08/17/21   [name], MD  Menthol, Topical Analgesic, (BIOFREEZE) 4 % GEL Apply 1 Application topically in the morning, at noon, and at bedtime. Apply to left knee and both wrist    [provider]  methotrexate (RHEUMATREX) 2.5 MG tablet Take 10 mg by mouth once a week. 06/13/22   [provider]  omeprazole (PRILOSEC) 20 MG capsule Take 20 mg by mouth in the morning and at bedtime.    [provider]  ondansetron (ZOFRAN-ODT) 8 MG disintegrating tablet Take 8 mg by mouth 2 (two) times daily as needed for vomiting or nausea. 10/26/21   [provider]  oxyCODONE (OXYCONTIN) 20 mg 12 hr tablet Take 20 mg by mouth daily as needed (osteoarthritis).    [provider]  oxyCODONE 10 MG TABS Take 1 tablet (10 mg total) by mouth every 4 (four) hours as needed for severe pain. 12/14/21   Magnant, Charles L, PA-C  polyethylene glycol powder (  GLYCOLAX/MIRALAX) 17 GM/SCOOP powder Take 17 g by mouth daily as needed. Patient taking differently: Take 17 g by mouth daily as needed for mild constipation. 08/17/21   [name], MD  psyllium (REGULOID) 0.52 g capsule Take 0.52 g by mouth at bedtime.    [provider]  rivaroxaban (XARELTO) 10 MG TABS tablet Take 1 tablet (10 mg total) by mouth daily. 04/17/22   Magnant, Gerrianne Scale, PA-C  sennosides-docusate sodium (SENOKOT-S) 8.6-50 MG tablet Take 1 tablet by mouth every Monday, Wednesday, and Friday at 8 PM.     [provider]  sodium chloride (OCEAN) 0.65 % nasal spray Place 1 spray into the nose as needed (allergy symptoms including congestion and drainage). 06/26/22   [name], MD  Vitamin D, Ergocalciferol, (DRISDOL) 1.25 MG (50000 UNIT) CAPS capsule Take 1 capsule (50,000 Units total) by mouth every 7 (seven) days. Patient taking differently: Take 50,000 Units by mouth every Friday. 05/26/21   [name], MD  Grant Ruts INHUB 250-50 MCG/ACT AEPB INHALE 1 PUFF BY MOUTH TWICE A DAY 06/07/21   [name], MD      Allergies    Ibuprofen and Sulfa antibiotics    Review of Systems   Review of Systems  Physical Exam Updated Vital Signs BP (!) 168/91   Pulse (!) 109   Temp 100.2 F (37.9 C) (Oral)   Resp (!) 27   Ht 5' (1.524 m)   Wt 81.6 kg   SpO2 97%   BMI 35.15 kg/m   Physical Exam Vitals and nursing note reviewed.  Constitutional:      General: She is not in acute distress.    Appearance: She is well-developed.     Comments: Warm to touch  HENT:     Head: Normocephalic and atraumatic.     Right Ear: External ear normal.     Left Ear: External ear normal.     Nose: Nose normal.     Mouth/Throat:     Mouth: Mucous membranes are moist.  Eyes:     Conjunctiva/sclera: Conjunctivae normal.     Comments: He was on the left irregular pupil on left, previous surgery on the eye  Cardiovascular:     Rate and Rhythm: Regular rhythm. Tachycardia present.     Heart sounds: No murmur heard. Pulmonary:     Effort: No respiratory distress.     Breath sounds: No wheezing, rhonchi or rales.  Abdominal:     Palpations: Abdomen is soft.     Tenderness: There is no abdominal tenderness. There is no guarding or rebound.  Musculoskeletal:     Cervical back: Normal range of motion and neck supple.     Right lower leg: No edema.     Left lower leg: No edema.     Comments: Left upper extremity: Patient reports tenderness to palpation at the left elbow.  I do not feel any  joint effusions, feel any nodules, abscess, warmth.  No obvious signs of cellulitis.  Skin:    General: Skin is warm and dry.     Findings: No rash.  Neurological:     General: No focal deficit present.     Mental Status: She is alert. Mental status is at baseline.     Motor: No weakness.  Psychiatric:        Mood and Affect: Mood normal.     ED Results / Procedures / Treatments   Labs (all labs ordered are listed, but only  abnormal results are displayed) Labs Reviewed  COMPREHENSIVE METABOLIC PANEL - Abnormal; Notable for the following components:      Result Value   Glucose, Bld 117 (*)    Calcium 8.7 (*)    All other components within normal limits  CBC WITH DIFFERENTIAL/PLATELET - Abnormal; Notable for the following components:   Hemoglobin 11.8 (*)    RDW 16.8 (*)    All other components within normal limits  PROTIME-INR - Abnormal; Notable for the following components:   Prothrombin Time 17.5 (*)    INR 1.5 (*)    All other components within normal limits  URINALYSIS, ROUTINE W REFLEX MICROSCOPIC - Abnormal; Notable for the following components:   Hgb urine dipstick SMALL (*)    Nitrite POSITIVE (*)    Leukocytes,Ua SMALL (*)    Bacteria, UA MANY (*)    All other components within normal limits  CULTURE, BLOOD (ROUTINE X 2)  RESP PANEL BY RT-PCR (RSV, FLU A&B, COVID)  [id number]  CULTURE, BLOOD (ROUTINE X 2)  URINE CULTURE  LACTIC ACID, PLASMA  LACTIC ACID, PLASMA    ED ECG REPORT   Date: 11/08/2022  Rate: 106  Rhythm: sinus tachycardia  QRS Axis: left  Intervals: normal  ST/T Wave abnormalities: nonspecific ST changes  Conduction Disutrbances:none  Narrative Interpretation:   Old EKG Reviewed: unchanged from 12/22 other than axis is more leftward today  I have personally reviewed the EKG tracing and agree with the computerized printout as noted.   Radiology DG Chest 2 View  Result Date: 11/08/2022 CLINICAL DATA:  Sepsis and shortness of breath. EXAM:  CHEST - 2 VIEW COMPARISON:  06/02/2022 FINDINGS: The heart size and mediastinal contours are within normal limits. There is no evidence of pulmonary edema, consolidation, pneumothorax, nodule or pleural fluid. The visualized skeletal structures are unremarkable. IMPRESSION: No active cardiopulmonary disease. Electronically Signed   By: Aletta Edouard M.D.   On: 11/08/2022 15:35    Procedures Procedures    Medications Ordered in ED Medications - No data to display  ED Course/ Medical Decision Making/ A&P    Patient seen and examined. History obtained directly from patient.   Labs/EKG: Ordered sepsis workup.  Imaging: Ordered chest x-ray.  Medications/Fluids: Ordered: PO tylenol  Most recent vital signs reviewed and are as follows: BP (!) 168/91   Pulse (!) 109   Temp 100.2 F (37.9 C) (Oral)   Resp (!) 27   Ht 5' (1.524 m)   Wt 81.6 kg   SpO2 97%   BMI 35.15 kg/m   Initial impression: febrile illness, + SIRS criteria but no obvious source at this time.   6:35 PM Reassessment performed. Patient appears stable.   Labs personally reviewed and interpreted including: CBC with white blood cell count 8.7, hemoglobin 11.8 otherwise unremarkable with normal neutrophils; CMP glucose 117, normal kidney function, normal liver function test; lactate was normal at 1.6; respiratory panel negative; PT/INR mildly elevated at 1.5 consistent with DOAC use.  Imaging personally visualized and interpreted including: Chest x-ray agree negative.  Reviewed pertinent lab work and imaging with patient at bedside. Questions answered.   Most current vital signs reviewed and are as follows: BP (!) 147/72   Pulse (!) 121   Temp (!) 101.7 F (38.7 C) (Rectal)   Resp (!) 28   Ht 5' (1.524 m)   Wt 81.6 kg   SpO2 96%   BMI 35.15 kg/m   Plan: Continuing to await urine testing to evaluate for possibility  of urosepsis.  Will go ahead and give antibiotics additional fluids.  Rectal temperature is  101.7 F.  Still unable to confirm sepsis.   7:33 PM Reassessment performed. Patient appears stable.  Continues to be tachycardic.  Additional Tylenol ordered.  He does have urine from the pure wick.  Reviewed pertinent lab work and imaging with patient at bedside. Questions answered.   Most current vital signs reviewed and are as follows: BP (!) 147/72   Pulse (!) 121   Temp (!) 101.7 F (38.7 C) (Rectal)   Resp (!) 28   Ht 5' (1.524 m)   Wt 81.6 kg   SpO2 96%   BMI 35.15 kg/m   Plan: Additional Tylenol.  8:48 PM consulted with [facility] hospitalist.  They will see for admission.  In the interim, UA reviewed and interpreted, concerning for UTI, culture added.  Patient has received Rocephin.                                Medical Decision Making Amount and/or Complexity of Data Reviewed Labs: ordered. Radiology: ordered.  Risk OTC drugs. Prescription drug management.   Patient with febrile UTI, history of immunocompromise.  Will be admitted to the hospital for treatment and management.  She is septic given positive SIRS criteria and suspected source.  Do not suspect severe sepsis as patient is not hypotensive and lactate is normal.        Final Clinical Impression(s) / ED Diagnoses Final diagnoses:  Acute cystitis without hematuria    Rx / DC Orders ED Discharge Orders     None         Carlisle Cater, PA-C 11/08/22 2051    Wynona Dove A, DO 11/10/22 [phone]

## 2022-11-09 DIAGNOSIS — Z1612 Extended spectrum beta lactamase (ESBL) resistance: Secondary | ICD-10-CM | POA: Diagnosis not present

## 2022-11-09 DIAGNOSIS — N10 Acute tubulo-interstitial nephritis: Secondary | ICD-10-CM | POA: Diagnosis not present

## 2022-11-09 DIAGNOSIS — A498 Other bacterial infections of unspecified site: Secondary | ICD-10-CM | POA: Diagnosis present

## 2022-11-09 DIAGNOSIS — R7881 Bacteremia: Secondary | ICD-10-CM

## 2022-11-09 DIAGNOSIS — A419 Sepsis, unspecified organism: Secondary | ICD-10-CM | POA: Diagnosis present

## 2022-11-09 LAB — COMPREHENSIVE METABOLIC PANEL
ALT: 20 U/L (ref 0–44)
AST: 26 U/L (ref 15–41)
Albumin: 2.9 g/dL — ABNORMAL LOW (ref 3.5–5.0)
Alkaline Phosphatase: 75 U/L (ref 38–126)
Anion gap: 11 (ref 5–15)
BUN: 8 mg/dL (ref 8–23)
CO2: 22 mmol/L (ref 22–32)
Calcium: 8 mg/dL — ABNORMAL LOW (ref 8.9–10.3)
Chloride: 101 mmol/L (ref 98–111)
Creatinine, Ser: 0.59 mg/dL (ref 0.44–1.00)
GFR, Estimated: >60 mL/min (ref >60)
Glucose, Bld: 160 mg/dL — ABNORMAL HIGH (ref 70–99)
Potassium: 3.7 mmol/L (ref 3.5–5.1)
Sodium: 134 mmol/L — ABNORMAL LOW (ref 135–145)
Total Bilirubin: 0.7 mg/dL (ref 0.3–1.2)
Total Protein: 6 g/dL — ABNORMAL LOW (ref 6.5–8.1)

## 2022-11-09 LAB — BLOOD CULTURE ID PANEL (REFLEXED) - BCID2

## 2022-11-09 LAB — CBC
HCT: 32.4 % — ABNORMAL LOW (ref 36.0–46.0)
Hemoglobin: 10.5 g/dL — ABNORMAL LOW (ref 12.0–15.0)
MCH: 31.1 pg (ref 26.0–34.0)
MCHC: 32.4 g/dL (ref 30.0–36.0)
MCV: 95.9 fL (ref 80.0–100.0)
Platelets: 280 10*3/uL (ref 150–400)
RBC: 3.38 MIL/uL — ABNORMAL LOW (ref 3.87–5.11)
RDW: 16.9 % — ABNORMAL HIGH (ref 11.5–15.5)
WBC: 17.4 10*3/uL — ABNORMAL HIGH (ref 4.0–10.5)
nRBC: 0 % (ref 0.0–0.2)

## 2022-11-09 LAB — C-REACTIVE PROTEIN: CRP: 14.9 mg/dL — ABNORMAL HIGH (ref <1.0)

## 2022-11-09 LAB — TSH: TSH: 0.918 u[IU]/mL (ref 0.350–4.500)

## 2022-11-09 LAB — MRSA NEXT GEN BY PCR, NASAL: MRSA by PCR Next Gen: NOT DETECTED

## 2022-11-09 LAB — SEDIMENTATION RATE: Sed Rate: 30 mm/hr — ABNORMAL HIGH (ref 0–22)

## 2022-11-09 LAB — PROCALCITONIN: Procalcitonin: 4.79 ng/mL

## 2022-11-09 MED ORDER — SODIUM CHLORIDE 0.9 % IV SOLN
1.0000 g | Freq: Three times a day (TID) | INTRAVENOUS | Status: DC
  Administered 2022-11-09 – 2022-11-12 (×10): 1 g via INTRAVENOUS
  Filled 2022-11-09 (×11): qty 20

## 2022-11-09 MED ORDER — ENOXAPARIN SODIUM 40 MG/0.4ML IJ SOSY
40.0000 mg | PREFILLED_SYRINGE | INTRAMUSCULAR | Status: DC
  Administered 2022-11-09 – 2022-11-18 (×9): 40 mg via SUBCUTANEOUS
  Filled 2022-11-09 (×9): qty 0.4

## 2022-11-09 NOTE — TOC Initial Note (Signed)
Transition of Care [facility]) - Initial/Assessment Note    Patient Details  Name: [name] MRN: [id number] Date of Birth: 1951/03/14  Transition of Care [facility]) CM/SW Contact:    Verdell Carmine, RN Phone Number: 11/09/2022, 8:45 AM  Clinical Narrative:                  72 yo from [facility] assisted living. Was able to live independently and perform ADL. Immunocompromise due to methotrexate Rheumatic disease. Now presents with febrile illness, blood cultures positive for CTX.ESBL, UTI.   Plan: SNF vs Assisted living PT to assess.  TOC will follow for needs, recommendations, and transitions of care. Expected Discharge Plan:  (from [facility] assisted living vs SNF) Barriers to Discharge: Continued Medical Work up   Patient Goals and CMS Choice            Expected Discharge Plan and Services In-house Referral: Clinical Social Work Discharge Planning Services: CM Consult   Living arrangements for the past 2 months: [place]                                      Prior Living Arrangements/Services Living arrangements for the past 2 months: [place] Lives with:: Self Patient language and need for interpreter reviewed:: Yes        Need for Family Participation in Patient Care: Yes (Comment) Care giver support system in place?: Yes (comment)   Criminal Activity/Legal Involvement Pertinent to Current Situation/Hospitalization: No - Comment as needed  Activities of Daily Living Home Assistive Devices/Equipment: Hearing aid ADL Screening (condition at time of admission) Patient's cognitive ability adequate to safely complete daily activities?: Yes Is the patient deaf or have difficulty hearing?: Yes Does the patient have difficulty seeing, even when wearing glasses/contacts?: No Does the patient have difficulty concentrating, remembering, or making decisions?: No Patient able to express need for assistance with ADLs?:  Yes Does the patient have difficulty dressing or bathing?: No Independently performs ADLs?: Yes (appropriate for developmental age) Does the patient have difficulty walking or climbing stairs?: Yes Weakness of Legs: Both Weakness of Arms/Hands: Both  Permission Sought/Granted                  Emotional Assessment       Orientation: : Oriented to Self, Oriented to Place Alcohol / Substance Use: Illicit Drugs Psych Involvement: No (comment)  Admission diagnosis:  Dyspnea [R06.00] Acute cystitis without hematuria [N30.00] Patient Active Problem List   Diagnosis Date Noted   Dyspnea 11/08/2022   DJD (degenerative joint disease) of knee 04/13/2022   S/P TKR (total knee replacement), left 04/13/2022   Arthritis of right knee    S/P total knee arthroplasty, right 12/08/2021   Pressure injury of skin 08/27/2021   Fall 08/23/2021   Difficulty in walking 08/23/2021   Bilateral lower extremity edema 08/23/2021   Hormone replacement therapy 08/23/2021   Osteoporosis 01/28/2021   Osteoarthritis of right wrist 01/28/2021   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   Foot pain 09/13/2020   Knee pain 08/24/2018   Fall in home 07/14/2015   TIBIALIS TENDINITIS 08/25/2009   CHEST PAIN, OTHER, PAIN 10/21/2008   PEDAL EDEMA 07/22/2008   ANKLE PAIN, BILATERAL 04/01/2008   LOW BACK PAIN 04/01/2008   ACUTE BRONCHITIS 09/23/2007   SARCOIDOSIS 09/02/2007   Asthma 09/02/2007   Diverticulosis of colon 07/11/2007   BACK PAIN, CHRONIC  07/11/2007   PCP:  [name], MD Pharmacy:   [id number] [place], [place]Phone: [phone]: [address]Phone: [phone]: [phone]    Social Determinants of Health (SDOH) Social History: SDOH Screenings   Food Insecurity: No Food Insecurity (11/09/2022)   Housing: Low Risk  (11/09/2022)  Transportation Needs: No Transportation Needs (11/09/2022)  Utilities: Not At Risk (11/09/2022)  Alcohol Screen: Low Risk  (07/20/2022)  Depression (PHQ2-9): Low Risk  (07/20/2022)  Financial Resource Strain: Low Risk  (07/20/2022)  Physical Activity: Sufficiently Active (07/20/2022)  Social Connections: Moderately Integrated (07/20/2022)  Stress: Stress Concern Present (07/20/2022)  Tobacco Use: Low Risk  (11/08/2022)   SDOH Interventions: Housing Interventions: Intervention Not Indicated   Readmission Risk Interventions     No data to display

## 2022-11-09 NOTE — Progress Notes (Signed)
I saw [name] yesterday in the emergency department Discussed with her that the cervical spine MRI scan did not really show any lesions which would account for some of the atrophy that she was having in her upper extremities. We can revisit this issue when she is more medically stabilized but for now it does not appear that she has myelopathy or nerve compression from the neck which is contributing to her bilateral hand weakness and intrinsic muscle atrophy.

## 2022-11-09 NOTE — Progress Notes (Signed)
PHARMACY - PHYSICIAN COMMUNICATION CRITICAL VALUE ALERT - BLOOD CULTURE IDENTIFICATION (BCID)  [name] is an 72 y.o. female who presented to [facility] on 11/08/2022 with a chief complaint of fever  Assessment:  ESBL infection  Name of physician (or Provider) Contacted: Dr. Claria Dice   Current antibiotics: Vancomycin, Cefepime  Changes to prescribed antibiotics recommended:  DC Cefepime Start Merrem 1g IV q8h Consider DC vancomycin once septic elbow ruled out  Results for orders placed or performed during the hospital encounter of 11/08/22  Blood Culture ID Panel (Reflexed) (Collected: 11/08/2022  3:07 PM)  Result Value Ref Range   Enterococcus faecalis NOT DETECTED NOT DETECTED   Enterococcus Faecium NOT DETECTED NOT DETECTED   Listeria monocytogenes NOT DETECTED NOT DETECTED   Staphylococcus species NOT DETECTED NOT DETECTED   Staphylococcus aureus (BCID) NOT DETECTED NOT DETECTED   Staphylococcus epidermidis NOT DETECTED NOT DETECTED   Staphylococcus lugdunensis NOT DETECTED NOT DETECTED   Streptococcus species NOT DETECTED NOT DETECTED   Streptococcus agalactiae NOT DETECTED NOT DETECTED   Streptococcus pneumoniae NOT DETECTED NOT DETECTED   Streptococcus pyogenes NOT DETECTED NOT DETECTED   A.calcoaceticus-baumannii NOT DETECTED NOT DETECTED   Bacteroides fragilis NOT DETECTED NOT DETECTED   Enterobacterales DETECTED (A) NOT DETECTED   Enterobacter cloacae complex NOT DETECTED NOT DETECTED   Escherichia coli DETECTED (A) NOT DETECTED   Klebsiella aerogenes NOT DETECTED NOT DETECTED   Klebsiella oxytoca NOT DETECTED NOT DETECTED   Klebsiella pneumoniae NOT DETECTED NOT DETECTED   Proteus species NOT DETECTED NOT DETECTED   Salmonella species NOT DETECTED NOT DETECTED   Serratia marcescens NOT DETECTED NOT DETECTED   Haemophilus influenzae NOT DETECTED NOT DETECTED   Neisseria meningitidis NOT DETECTED NOT DETECTED   Pseudomonas aeruginosa NOT DETECTED NOT DETECTED    Stenotrophomonas maltophilia NOT DETECTED NOT DETECTED   Candida albicans NOT DETECTED NOT DETECTED   Candida auris NOT DETECTED NOT DETECTED   Candida glabrata NOT DETECTED NOT DETECTED   Candida krusei NOT DETECTED NOT DETECTED   Candida parapsilosis NOT DETECTED NOT DETECTED   Candida tropicalis NOT DETECTED NOT DETECTED   Cryptococcus neoformans/gattii NOT DETECTED NOT DETECTED   CTX-M ESBL DETECTED (A) NOT DETECTED   Carbapenem resistance IMP NOT DETECTED NOT DETECTED   Carbapenem resistance KPC NOT DETECTED NOT DETECTED   Carbapenem resistance NDM NOT DETECTED NOT DETECTED   Carbapenem resist OXA 48 LIKE NOT DETECTED NOT DETECTED   Carbapenem resistance VIM NOT DETECTED NOT DETECTED    Narda Bonds 11/09/2022  6:17 AM

## 2022-11-09 NOTE — Progress Notes (Addendum)
PROGRESS NOTE    [name]  HEN:[id number] DOB: 1950-10-13 DOA: 11/08/2022 PCP: [name], MD   Brief Narrative:  [name] is a 72 y.o. female with medical history significant of rheumatoid arthritis, GERD, asthma, diverticulosis, obesity, sarcoidosis not on treatment, here with fevers and weakness. Admitted for evaluation and treatment of sepsis secondary to suspected UTI.  Assessment & Plan:   Principal Problem:   Dyspnea   Sepsis UTI - suspected ESBL organism Bacteremia, POA Concurrent R sided pyelonephritis -Patient present with fever, tachycardia, tachypnea and weakness.  -UA is consistent with UTI -Immunosuppressed on Humira -CT abdomen pelvis concerning for pyelonephritis -Cultures pending -Continue meropenem, vancomycin pending cultures - de-escalate as appropriate - biofire results concerning for ESBL bacteremia, ID sidelined for further discussion versus consult pending their evaluation.   Acute metabolic encephalopathy, resolved  -Patient appears to be back to baseline, was having difficulty providing history overnight but is now alert oriented x 4, able to provide appropriate history without difficulty  Left elbow pain, unspecified Likely secondary to advanced osteoarthritis Rule out septic arthritis -Reports MRI outpatient with Dr. Marlou Sa early January - this appears to have been a C-spine image however not an elbow. She has plans to return to his office for repeat imaging. -Tender to palpation but without overt findings - on Humira so there is question of septic arthritis -Antibiotics as above, unlikely septic arthritis given obvious source as above - follow up clinically -Xray shows mild joint effusion and advanced arthritis - will discuss further imaging with ID   Hx of RA Arthritis -hold methothrexate -s/p Humira dose today she reports -Continue oxycodone 12-hour tablet 20 mg every 12 hours, oxycodone 10 mg every 4 hours as needed for  severe pain -Gabapentin 300 mg twice daily -Cymbalta 30 mg daily -Bowel regimen Colace 100 mg daily, miralax prn, psyllium, senna-colace MWF   Hx of b/l knee replacement Stable, anticoagulation ongoing, unclear etiology -Pharmacy was able to verify patient continues to be on Xarelto, unclear etiology as her last knee surgery was March 2023.  PCP at [facility] has been sent a message in regards to diagnosis for ongoing anticoagulation.   COPD -Not in acute exacerbation -Continue home Dulera   GERD -PPI   Normocytic anemia S/p 2 L fluids.  -partially dilutional, continue monitor   DVT prophylaxis: Lovenox Code Status: Full Family Communication: None present  Status is: Inpatient  Dispo: The patient is from: Home              Anticipated d/c is to: To be determined              Anticipated d/c date is: 59 to 72 hours pending clinical course and findings              Patient currently not medically stable for discharge given ongoing need for IV antibiotics further evaluation and possible imaging given disseminated infection as above  Consultants:  Infectious disease  Procedures:  None  Antimicrobials:  Meropenem, vancomycin ongoing  Subjective: No acute issues or events overnight denies nausea vomiting diarrhea constipation headache fevers chills or chest pain.  She does note ongoing left elbow pain but markedly improved since intake.  Reports feeling "generally weak" but no specific complaints other than L elbow pain  Objective: Vitals:   11/09/22 0000 11/09/22 0203 11/09/22 0300 11/09/22 0500  BP: 115/72 114/72 126/79 131/85  Pulse: (!) 108 (!) 102 95 91  Resp: '19 20 14 13  '$ Temp:  98.6 F (37 C) 97.9 F (36.6 C)   TempSrc:  Oral Oral   SpO2: 94% 95% 94% 93%  Weight:      Height:        Intake/Output Summary (Last 24 hours) at 11/09/2022 0656 Last data filed at 11/09/2022 9702 Gross per 24 hour  Intake 570.38 ml  Output 600 ml  Net -29.62 ml   Filed  Weights   11/08/22 1451 11/08/22 2318  Weight: 81.6 kg 87.2 kg    Examination:  General:  Pleasantly resting in bed, No acute distress. HEENT:  Normocephalic atraumatic.  Sclerae nonicteric, noninjected.  Extraocular movements intact bilaterally. Neck:  Without mass or deformity.  Trachea is midline. Lungs:  Clear to auscultate bilaterally without rhonchi, wheeze, or rales. Heart:  Regular rate and rhythm.  Without murmurs, rubs, or gallops. Abdomen:  Soft, nontender, nondistended.  Without guarding or rebound. Extremities: Left elbow tender to palpation, range of motion intact strength intact. Vascular:  Dorsalis pedis and posterior tibial pulses palpable bilaterally. Skin:  Warm and dry, no erythema, no ulcerations.  Data Reviewed: I have personally reviewed following labs and imaging studies  CBC: Recent Labs  Lab 11/08/22 1500 11/09/22 0023  WBC 8.7 17.4*  NEUTROABS 7.0  --   HGB 11.8* 10.5*  HCT 38.2 32.4*  MCV 97.9 95.9  PLT 320 637   Basic Metabolic Panel: Recent Labs  Lab 11/08/22 1500 11/09/22 0023  NA 135 134*  K 4.2 3.7  CL 99 101  CO2 23 22  GLUCOSE 117* 160*  BUN 10 8  CREATININE 0.69 0.59  CALCIUM 8.7* 8.0*   GFR: Estimated Creatinine Clearance: 61.5 mL/min (by C-G formula based on SCr of 0.59 mg/dL). Liver Function Tests: Recent Labs  Lab 11/08/22 1500 11/09/22 0023  AST 37 26  ALT 26 20  ALKPHOS 87 75  BILITOT 0.7 0.7  PROT 7.2 6.0*  ALBUMIN 3.8 2.9*   No results for input(s): "LIPASE", "AMYLASE" in the last 168 hours. No results for input(s): "AMMONIA" in the last 168 hours. Coagulation Profile: Recent Labs  Lab 11/08/22 1500  INR 1.5*   Thyroid Function Tests: Recent Labs    11/09/22 0023  TSH 0.918   Sepsis Labs: Recent Labs  Lab 11/08/22 1500 11/08/22 1838 11/09/22 0023  PROCALCITON  --   --  4.79  LATICACIDVEN 1.6 1.3  --     Recent Results (from the past 240 hour(s))  Culture, blood (Routine x 2)     Status:  None (Preliminary result)   Collection Time: 11/08/22  3:07 PM   Specimen: BLOOD RIGHT FOREARM  Result Value Ref Range Status   Specimen Description BLOOD RIGHT FOREARM  Final   Special Requests   Final    BOTTLES DRAWN AEROBIC AND ANAEROBIC Blood Culture adequate volume   Culture  Setup Time   Final    GRAM NEGATIVE RODS IN BOTH AEROBIC AND ANAEROBIC BOTTLES CRITICAL RESULT CALLED TO, READ BACK BY AND VERIFIED WITH: PHARMD G. LEDFORD 11/09/22 @ 0602 BY AB Performed at [facility], [place] [address], [place], [place] [address]    Culture GRAM NEGATIVE RODS  Final   Report Status PENDING  Incomplete  Blood Culture ID Panel (Reflexed)     Status: Abnormal   Collection Time: 11/08/22  3:07 PM  Result Value Ref Range Status   Enterococcus faecalis NOT DETECTED NOT DETECTED Final   Enterococcus Faecium NOT DETECTED NOT DETECTED Final   Listeria monocytogenes NOT DETECTED NOT  DETECTED Final   Staphylococcus species NOT DETECTED NOT DETECTED Final   Staphylococcus aureus (BCID) NOT DETECTED NOT DETECTED Final   Staphylococcus epidermidis NOT DETECTED NOT DETECTED Final   Staphylococcus lugdunensis NOT DETECTED NOT DETECTED Final   Streptococcus species NOT DETECTED NOT DETECTED Final   Streptococcus agalactiae NOT DETECTED NOT DETECTED Final   Streptococcus pneumoniae NOT DETECTED NOT DETECTED Final   Streptococcus pyogenes NOT DETECTED NOT DETECTED Final   A.calcoaceticus-baumannii NOT DETECTED NOT DETECTED Final   Bacteroides fragilis NOT DETECTED NOT DETECTED Final   Enterobacterales DETECTED (A) NOT DETECTED Final    Comment: Enterobacterales represent a large order of gram negative bacteria, not a single organism. CRITICAL RESULT CALLED TO, READ BACK BY AND VERIFIED WITH: PHARMD G. LEDFORD 11/09/22 @ 0602 BY AB    Enterobacter cloacae complex NOT DETECTED NOT DETECTED Final   Escherichia coli DETECTED (A) NOT DETECTED Final    Comment: CRITICAL RESULT CALLED TO, READ BACK BY AND  VERIFIED WITH: PHARMD G. LEDFORD 11/09/22 @ 0602 BY AB    Klebsiella aerogenes NOT DETECTED NOT DETECTED Final   Klebsiella oxytoca NOT DETECTED NOT DETECTED Final   Klebsiella pneumoniae NOT DETECTED NOT DETECTED Final   Proteus species NOT DETECTED NOT DETECTED Final   Salmonella species NOT DETECTED NOT DETECTED Final   Serratia marcescens NOT DETECTED NOT DETECTED Final   Haemophilus influenzae NOT DETECTED NOT DETECTED Final   Neisseria meningitidis NOT DETECTED NOT DETECTED Final   Pseudomonas aeruginosa NOT DETECTED NOT DETECTED Final   Stenotrophomonas maltophilia NOT DETECTED NOT DETECTED Final   Candida albicans NOT DETECTED NOT DETECTED Final   Candida auris NOT DETECTED NOT DETECTED Final   Candida glabrata NOT DETECTED NOT DETECTED Final   Candida krusei NOT DETECTED NOT DETECTED Final   Candida parapsilosis NOT DETECTED NOT DETECTED Final   Candida tropicalis NOT DETECTED NOT DETECTED Final   Cryptococcus neoformans/gattii NOT DETECTED NOT DETECTED Final   CTX-M ESBL DETECTED (A) NOT DETECTED Final    Comment: CRITICAL RESULT CALLED TO, READ BACK BY AND VERIFIED WITH: PHARMD G. LEDFORD 11/09/22 @ 0602 BY AB (NOTE) Extended spectrum beta-lactamase detected. Recommend a carbapenem as initial therapy.      Carbapenem resistance IMP NOT DETECTED NOT DETECTED Final   Carbapenem resistance KPC NOT DETECTED NOT DETECTED Final   Carbapenem resistance NDM NOT DETECTED NOT DETECTED Final   Carbapenem resist OXA 48 LIKE NOT DETECTED NOT DETECTED Final   Carbapenem resistance VIM NOT DETECTED NOT DETECTED Final    Comment: Performed at [facility], [place] [address] [place], [place] [address]  Resp panel by RT-PCR (RSV, Flu A&B, Covid) Anterior Nasal Swab     Status: None   Collection Time: 11/08/22  3:12 PM   Specimen: Anterior Nasal Swab  Result Value Ref Range Status   SARS Coronavirus 2 by RT PCR NEGATIVE NEGATIVE Final   Influenza A by PCR NEGATIVE NEGATIVE Final    Influenza B by PCR NEGATIVE NEGATIVE Final    Comment: (NOTE) The Xpert Xpress SARS-CoV-2/FLU/RSV plus assay is intended as an aid in the diagnosis of influenza from Nasopharyngeal swab specimens and should not be used as a sole basis for treatment. Nasal washings and aspirates are unacceptable for Xpert Xpress SARS-CoV-2/FLU/RSV testing.  Fact Sheet for Patients: EntrepreneurPulse.com.au  Fact Sheet for Healthcare Providers: IncredibleEmployment.be  This test is not yet approved or cleared by the [place] FDA and has been authorized for detection and/or diagnosis of SARS-CoV-2 by FDA  under an Emergency Use Authorization (EUA). This EUA will remain in effect (meaning this test can be used) for the duration of the COVID-19 declaration under Section 564(b)(1) of the Act, 21 U.S.C. section 360bbb-3(b)(1), unless the authorization is terminated or revoked.     Resp Syncytial Virus by PCR NEGATIVE NEGATIVE Final    Comment: (NOTE) Fact Sheet for Patients: EntrepreneurPulse.com.au  Fact Sheet for Healthcare Providers: IncredibleEmployment.be  This test is not yet approved or cleared by the [place] FDA and has been authorized for detection and/or diagnosis of SARS-CoV-2 by FDA under an Emergency Use Authorization (EUA). This EUA will remain in effect (meaning this test can be used) for the duration of the COVID-19 declaration under Section 564(b)(1) of the Act, 21 U.S.C. section 360bbb-3(b)(1), unless the authorization is terminated or revoked.  Performed at [facility], [place] [address], [place], [place] [address]   MRSA Next Gen by PCR, Nasal     Status: None   Collection Time: 11/08/22 11:30 PM   Specimen: Nasal Mucosa; Nasal Swab  Result Value Ref Range Status   MRSA by PCR Next Gen NOT DETECTED NOT DETECTED Final    Comment: (NOTE) The GeneXpert MRSA Assay (FDA approved for NASAL  specimens only), is one component of a comprehensive MRSA colonization surveillance program. It is not intended to diagnose MRSA infection nor to guide or monitor treatment for MRSA infections. Test performance is not FDA approved in patients less than 26 years old. Performed at [facility], [place] [address], [place], [place] [address]     Radiology Studies: CT ABDOMEN PELVIS W CONTRAST  Result Date: 11/08/2022 CLINICAL DATA:  Urinary tract infection. EXAM: CT ABDOMEN AND PELVIS WITH CONTRAST TECHNIQUE: Multidetector CT imaging of the abdomen and pelvis was performed using the standard protocol following bolus administration of intravenous contrast. RADIATION DOSE REDUCTION: This exam was performed according to the departmental dose-optimization program which includes automated exposure control, adjustment of the mA and/or kV according to patient size and/or use of iterative reconstruction technique. CONTRAST:  55m OMNIPAQUE IOHEXOL 350 MG/ML SOLN COMPARISON:  CT abdomen pelvis dated 07/12/2018. FINDINGS: Lower chest: The visualized lung bases are clear. No intra-abdominal free air or free fluid. Hepatobiliary: Apparent fatty liver. No biliary dilatation. The gallbladder is unremarkable. Pancreas: Unremarkable. No pancreatic ductal dilatation or surrounding inflammatory changes. Spleen: Normal in size without focal abnormality. Adrenals/Urinary Tract: The adrenal glands unremarkable. Similar appearance of predominantly exophytic angiomyolipoma arising from the inferior pole of the left kidney and measuring up to 4 cm. Small left renal upper pole cyst. Punctate nonobstructing right renal upper pole calculus. There is mild urothelial enhancement of the right ureter and renal collecting system concerning for pyelonephritis. Correlation with urinalysis recommended. No fluid collection or abscess. There is no hydronephrosis on either side. The urinary bladder is unremarkable. Stomach/Bowel: There is  colonic diverticulosis without active inflammatory changes. There is moderate stool throughout the colon. There is no bowel obstruction or active inflammation. The appendix is normal. Vascular/Lymphatic: Mild aortoiliac atherosclerotic disease. The IVC is unremarkable. No portal venous gas. There is no adenopathy. Reproductive: Hysterectomy.  No adnexal masses. Other: Small fat containing umbilical hernia. Musculoskeletal: Degenerative changes of the spine. No acute osseous pathology. IMPRESSION: 1. Findings concerning for right-sided pyelonephritis. Correlation with urinalysis recommended. No hydronephrosis. 2. Punctate nonobstructing right renal upper pole calculus. 3. Colonic diverticulosis. No bowel obstruction. Normal appendix. 4. Similar appearance of predominantly exophytic angiomyolipoma arising from the inferior pole of the left kidney.  5.  Aortic Atherosclerosis (ICD10-I70.0). Electronically Signed   By: Anner Crete M.D.   On: 11/08/2022 22:03   DG Elbow 2 Views Left  Result Date: 11/08/2022 CLINICAL DATA:  Pain, no known injury EXAM: LEFT ELBOW - 2 VIEW COMPARISON:  None Available. 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 Joint effusion present. Bone fragments are noted anteriorly on the lateral view. Cannot exclude avulsed fragments. No subluxation or dislocation. IMPRESSION: Advanced osteoarthritis. Left elbow joint effusion. Curvilinear bone fragments anteriorly on the lateral view, cannot exclude avulsed fragments. Electronically Signed   By: Rolm Baptise M.D.   On: 11/08/2022 21:15   DG Chest 2 View  Result Date: 11/08/2022 CLINICAL DATA:  Sepsis and shortness of breath. EXAM: CHEST - 2 VIEW COMPARISON:  06/02/2022 FINDINGS: The heart size and mediastinal contours are within normal limits. There is no evidence of pulmonary edema, consolidation, pneumothorax, nodule or pleural fluid. The visualized skeletal structures are unremarkable.  IMPRESSION: No active cardiopulmonary disease. Electronically Signed   By: Aletta Edouard M.D.   On: 11/08/2022 15:35        Scheduled Meds:  cycloSPORINE  1 drop Both Eyes BID   docusate sodium  100 mg Oral Daily   DULoxetine  30 mg Oral Daily   fluticasone  1 spray Each Nare Daily   gabapentin  300 mg Oral BID   mometasone-formoterol  2 puff Inhalation BID   oxyCODONE  20 mg Oral Q12H   pantoprazole  40 mg Oral Daily   psyllium  1 packet Oral Daily   [START ON 11/10/2022] senna-docusate  1 tablet Oral Q M,W,F-2000   Continuous Infusions:  meropenem (MERREM) IV     vancomycin       LOS: 1 day    Time spent: 73mn    Janith Nielson C Makenleigh Crownover, DO [facility]  If 7PM-7AM, please contact night-coverage [email or web address]  11/09/2022, 6:56 AM

## 2022-11-09 NOTE — Telephone Encounter (Signed)
Dr Marlou Sa discussed her MRI results with her in the ER yesterday

## 2022-11-09 NOTE — Progress Notes (Addendum)
Mobility Specialist Progress Note:   11/09/22 1158  Mobility  Activity Ambulated with assistance in hallway  Level of Assistance Contact guard assist, steadying assist  Assistive Device Front wheel walker  Distance Ambulated (ft) 120 ft  Activity Response Tolerated well  Mobility Referral Yes  $Mobility charge 1 Mobility   Pre- Mobility: 93% SpO2 (RA) During Mobility: 125 HR; 95% SpO2 (RA) Post Mobility:  127/65 BP; 97% SpO2 (RA)   Pt in bed willing to participate in mobility. Complaints of L shoulder pain. Pt in chair with call bell in reach and all needs met. Chair alarm on.   Gareth Eagle Wyman Meschke Mobility Specialist Please contact via [employer] or  Rehab Office at [phone]

## 2022-11-10 ENCOUNTER — Encounter (HOSPITAL_COMMUNITY): Payer: Self-pay

## 2022-11-10 ENCOUNTER — Inpatient Hospital Stay (HOSPITAL_COMMUNITY): Payer: Medicare PPO

## 2022-11-10 DIAGNOSIS — R7881 Bacteremia: Secondary | ICD-10-CM | POA: Diagnosis not present

## 2022-11-10 DIAGNOSIS — Z1612 Extended spectrum beta lactamase (ESBL) resistance: Secondary | ICD-10-CM | POA: Diagnosis not present

## 2022-11-10 DIAGNOSIS — N10 Acute tubulo-interstitial nephritis: Secondary | ICD-10-CM | POA: Diagnosis not present

## 2022-11-10 LAB — URINE CULTURE
Culture: 50000 — AB
Culture: 70000 — AB

## 2022-11-10 LAB — LEGIONELLA PNEUMOPHILA SEROGP 1 UR AG: L. pneumophila Serogp 1 Ur Ag: NEGATIVE

## 2022-11-10 MED ORDER — IOHEXOL 180 MG/ML  SOLN
10.0000 mL | Freq: Once | INTRAMUSCULAR | Status: AC | PRN
  Administered 2022-11-10: 10 mL via INTRA_ARTICULAR

## 2022-11-10 MED ORDER — LIDOCAINE HCL (PF) 1 % IJ SOLN
5.0000 mL | Freq: Once | INTRAMUSCULAR | Status: AC
  Administered 2022-11-10: 5 mL via INTRADERMAL

## 2022-11-10 MED ORDER — SODIUM CHLORIDE (PF) 0.9 % IJ SOLN: 10.0000 mL | INTRAMUSCULAR | Status: DC | PRN

## 2022-11-10 NOTE — Progress Notes (Signed)
Mobility Specialist Progress Note   11/10/22 1238  Mobility  Activity Refused mobility   Pt found in chair expressing that they were soiled and waiting on assistance for pericare. Requesting NT assistance and deferring mobility until clean. Will follow up later if time permits.    Holland Falling Mobility Specialist Please contact via [employer] or  Rehab office at [phone]

## 2022-11-10 NOTE — Progress Notes (Signed)
PROGRESS NOTE    [name]  [id number] DOB: 1951/02/08 DOA: 11/08/2022 PCP: [name], MD   Brief Narrative:  [name] is a 72 y.o. female with medical history significant of rheumatoid arthritis, GERD, asthma, diverticulosis, obesity, sarcoidosis not on treatment, here with fevers and weakness. Admitted for evaluation and treatment of sepsis secondary to suspected UTI.  Assessment & Plan:   Principal Problem:   Bacteremia Active Problems:   Dyspnea   Infection due to ESBL-producing Escherichia coli   Acute pyelonephritis   Sepsis ([place])  Sepsis UTI -secondary to ESBL E. coli Bacteremia, POA Concurrent R sided pyelonephritis -Patient present with fever, tachycardia, tachypnea and weakness.  -UA is consistent with UTI; urine culture 70,000 colonies of ESBL E. coli -Immunosuppressed on Humira -CT abdomen pelvis concerning for pyelonephritis -Blood cultures positive for E. coli, bio fire indicates ESBL strain -ID sidelined, continue meropenem for now -Left elbow aspiration pending IR schedule   Acute metabolic encephalopathy, resolved  -Patient appears to be back to baseline, was having difficulty providing history overnight but is now alert oriented x 4, able to provide appropriate history without difficulty  Left elbow pain, unspecified Likely secondary to advanced osteoarthritis Rule out septic arthritis -Reports MRI outpatient with Dr. Marlou Sa early January - this appears to have been a C-spine image however not an elbow. She has plans to return to his office for repeat imaging. -Tender to palpation but without overt findings - on Humira so there is question of septic arthritis -Antibiotics as above, unlikely septic arthritis given obvious source as above although there is disseminated bacteremia increasing patient's risk of secondary infections- follow up clinically -Xray shows mild joint effusion and advanced arthritis - will discuss further imaging  with ID -none for now   Hx of RA Arthritis -hold methothrexate -s/p Humira dose today she reports -Continue oxycodone 12-hour tablet 20 mg every 12 hours, oxycodone 10 mg every 4 hours as needed for severe pain -Gabapentin 300 mg twice daily -Cymbalta 30 mg daily -Bowel regimen Colace 100 mg daily, miralax prn, psyllium, senna-colace MWF   Hx of b/l knee replacement Stable, anticoagulation ongoing, unclear etiology -Pharmacy was able to verify patient continues to be on Xarelto, unclear etiology as her last knee surgery was March 2023.  PCP at [facility] has been sent a message in regards to diagnosis for ongoing anticoagulation.   COPD -Not in acute exacerbation -Continue home Dulera   GERD -PPI   Normocytic anemia S/p 2 L fluids.  -partially dilutional, continue monitor   DVT prophylaxis: Lovenox Code Status: Full Family Communication: None present  Status is: Inpatient  Dispo: The patient is from: Home              Anticipated d/c is to: To be determined              Anticipated d/c date is: 48 to 72 hours pending clinical course and findings              Patient currently not medically stable for discharge given ongoing need for IV antibiotics further evaluation and possible imaging given disseminated infection as above  Consultants:  Infectious disease  Procedures:  None  Antimicrobials:  Meropenem, vancomycin ongoing  Subjective: No acute issues or events overnight, patient complaining of bilateral hand pain consistent with her arthritis as well as pain at her right forearm IV site.  She notes her left elbow pain is markedly improving but not yet resolved.  She  otherwise denies nausea vomiting diarrhea constipation headache fevers chills or chest pain  Objective: Vitals:   11/09/22 1653 11/09/22 2100 11/09/22 2110 11/10/22 0524  BP: 131/67  131/69 138/66  Pulse: 88  98 (!) 105  Resp: 19  18 18  $ Temp: 98.1 F (36.7 C)  99.7 F (37.6 C) 99.9 F  (37.7 C)  TempSrc: Oral  Oral Oral  SpO2: 96% 97% 95% 90%  Weight:      Height:        Intake/Output Summary (Last 24 hours) at 11/10/2022 0803 Last data filed at 11/10/2022 0600 Gross per 24 hour  Intake 770 ml  Output 300 ml  Net 470 ml    Filed Weights   11/08/22 1451 11/08/22 2318  Weight: 81.6 kg 87.2 kg    Examination:  General:  Pleasantly resting in bed, No acute distress. HEENT:  Normocephalic atraumatic.  Sclerae nonicteric, noninjected.  Extraocular movements intact bilaterally. Neck:  Without mass or deformity.  Trachea is midline. Lungs:  Clear to auscultate bilaterally without rhonchi, wheeze, or rales. Heart:  Regular rate and rhythm.  Without murmurs, rubs, or gallops. Abdomen:  Soft, nontender, nondistended.  Without guarding or rebound. Extremities: Left elbow tender to palpation, range of motion intact strength intact. Vascular:  Dorsalis pedis and posterior tibial pulses palpable bilaterally. Skin:  Warm and dry, no erythema, no ulcerations.  Data Reviewed: I have personally reviewed following labs and imaging studies  CBC: Recent Labs  Lab 11/08/22 1500 11/09/22 0023  WBC 8.7 17.4*  NEUTROABS 7.0  --   HGB 11.8* 10.5*  HCT 38.2 32.4*  MCV 97.9 95.9  PLT 320 [id number]    Basic Metabolic Panel: Recent Labs  Lab 11/08/22 1500 11/09/22 0023  NA 135 134*  K 4.2 3.7  CL 99 101  CO2 23 22  GLUCOSE 117* 160*  BUN 10 8  CREATININE 0.69 0.59  CALCIUM 8.7* 8.0*    GFR: Estimated Creatinine Clearance: 61.5 mL/min (by C-G formula based on SCr of 0.59 mg/dL). Liver Function Tests: Recent Labs  Lab 11/08/22 1500 11/09/22 0023  AST 37 26  ALT 26 20  ALKPHOS 87 75  BILITOT 0.7 0.7  PROT 7.2 6.0*  ALBUMIN 3.8 2.9*    No results for input(s): "LIPASE", "AMYLASE" in the last 168 hours. No results for input(s): "AMMONIA" in the last 168 hours. Coagulation Profile: Recent Labs  Lab 11/08/22 1500  INR 1.5*    Thyroid Function Tests: Recent  Labs    11/09/22 0023  TSH 0.918    Sepsis Labs: Recent Labs  Lab 11/08/22 1500 11/08/22 1838 11/09/22 0023  PROCALCITON  --   --  4.79  LATICACIDVEN 1.6 1.3  --      Recent Results (from the past 240 hour(s))  Culture, blood (Routine x 2)     Status: Abnormal (Preliminary result)   Collection Time: 11/08/22  3:07 PM   Specimen: BLOOD RIGHT FOREARM  Result Value Ref Range Status   Specimen Description BLOOD RIGHT FOREARM  Final   Special Requests   Final    BOTTLES DRAWN AEROBIC AND ANAEROBIC Blood Culture adequate volume   Culture  Setup Time   Final    GRAM NEGATIVE RODS IN BOTH AEROBIC AND ANAEROBIC BOTTLES CRITICAL RESULT CALLED TO, READ BACK BY AND VERIFIED WITH: PHARMD G. LEDFORD 11/09/22 @ 0602 BY AB    Culture (A)  Final    ESCHERICHIA COLI SUSCEPTIBILITIES TO FOLLOW Performed at [facility], [place]  [address], [place], [place] [address]    Report Status PENDING  Incomplete  Blood Culture ID Panel (Reflexed)     Status: Abnormal   Collection Time: 11/08/22  3:07 PM  Result Value Ref Range Status   Enterococcus faecalis NOT DETECTED NOT DETECTED Final   Enterococcus Faecium NOT DETECTED NOT DETECTED Final   Listeria monocytogenes NOT DETECTED NOT DETECTED Final   Staphylococcus species NOT DETECTED NOT DETECTED Final   Staphylococcus aureus (BCID) NOT DETECTED NOT DETECTED Final   Staphylococcus epidermidis NOT DETECTED NOT DETECTED Final   Staphylococcus lugdunensis NOT DETECTED NOT DETECTED Final   Streptococcus species NOT DETECTED NOT DETECTED Final   Streptococcus agalactiae NOT DETECTED NOT DETECTED Final   Streptococcus pneumoniae NOT DETECTED NOT DETECTED Final   Streptococcus pyogenes NOT DETECTED NOT DETECTED Final   A.calcoaceticus-baumannii NOT DETECTED NOT DETECTED Final   Bacteroides fragilis NOT DETECTED NOT DETECTED Final   Enterobacterales DETECTED (A) NOT DETECTED Final    Comment: Enterobacterales represent a large order of gram  negative bacteria, not a single organism. CRITICAL RESULT CALLED TO, READ BACK BY AND VERIFIED WITH: PHARMD G. LEDFORD 11/09/22 @ 0602 BY AB    Enterobacter cloacae complex NOT DETECTED NOT DETECTED Final   Escherichia coli DETECTED (A) NOT DETECTED Final    Comment: CRITICAL RESULT CALLED TO, READ BACK BY AND VERIFIED WITH: PHARMD G. LEDFORD 11/09/22 @ 0602 BY AB    Klebsiella aerogenes NOT DETECTED NOT DETECTED Final   Klebsiella oxytoca NOT DETECTED NOT DETECTED Final   Klebsiella pneumoniae NOT DETECTED NOT DETECTED Final   Proteus species NOT DETECTED NOT DETECTED Final   Salmonella species NOT DETECTED NOT DETECTED Final   Serratia marcescens NOT DETECTED NOT DETECTED Final   Haemophilus influenzae NOT DETECTED NOT DETECTED Final   Neisseria meningitidis NOT DETECTED NOT DETECTED Final   Pseudomonas aeruginosa NOT DETECTED NOT DETECTED Final   Stenotrophomonas maltophilia NOT DETECTED NOT DETECTED Final   Candida albicans NOT DETECTED NOT DETECTED Final   Candida auris NOT DETECTED NOT DETECTED Final   Candida glabrata NOT DETECTED NOT DETECTED Final   Candida krusei NOT DETECTED NOT DETECTED Final   Candida parapsilosis NOT DETECTED NOT DETECTED Final   Candida tropicalis NOT DETECTED NOT DETECTED Final   Cryptococcus neoformans/gattii NOT DETECTED NOT DETECTED Final   CTX-M ESBL DETECTED (A) NOT DETECTED Final    Comment: CRITICAL RESULT CALLED TO, READ BACK BY AND VERIFIED WITH: PHARMD G. LEDFORD 11/09/22 @ 0602 BY AB (NOTE) Extended spectrum beta-lactamase detected. Recommend a carbapenem as initial therapy.      Carbapenem resistance IMP NOT DETECTED NOT DETECTED Final   Carbapenem resistance KPC NOT DETECTED NOT DETECTED Final   Carbapenem resistance NDM NOT DETECTED NOT DETECTED Final   Carbapenem resist OXA 48 LIKE NOT DETECTED NOT DETECTED Final   Carbapenem resistance VIM NOT DETECTED NOT DETECTED Final    Comment: Performed at [facility], [place] [address], [place], [place] [address]  Resp panel by RT-PCR (RSV, Flu A&B, Covid) Anterior Nasal Swab     Status: None   Collection Time: 11/08/22  3:12 PM   Specimen: Anterior Nasal Swab  Result Value Ref Range Status   SARS Coronavirus 2 by RT PCR NEGATIVE NEGATIVE Final   Influenza A by PCR NEGATIVE NEGATIVE Final   Influenza B by PCR NEGATIVE NEGATIVE Final    Comment: (NOTE) The Xpert Xpress SARS-CoV-2/FLU/RSV plus assay is intended as an aid in the diagnosis of influenza from  Nasopharyngeal swab specimens and should not be used as a sole basis for treatment. Nasal washings and aspirates are unacceptable for Xpert Xpress SARS-CoV-2/FLU/RSV testing.  Fact Sheet for Patients: EntrepreneurPulse.com.au  Fact Sheet for Healthcare Providers: IncredibleEmployment.be  This test is not yet approved or cleared by the [place] FDA and has been authorized for detection and/or diagnosis of SARS-CoV-2 by FDA under an Emergency Use Authorization (EUA). This EUA will remain in effect (meaning this test can be used) for the duration of the COVID-19 declaration under Section 564(b)(1) of the Act, 21 U.S.C. section 360bbb-3(b)(1), unless the authorization is terminated or revoked.     Resp Syncytial Virus by PCR NEGATIVE NEGATIVE Final    Comment: (NOTE) Fact Sheet for Patients: EntrepreneurPulse.com.au  Fact Sheet for Healthcare Providers: IncredibleEmployment.be  This test is not yet approved or cleared by the [place] FDA and has been authorized for detection and/or diagnosis of SARS-CoV-2 by FDA under an Emergency Use Authorization (EUA). This EUA will remain in effect (meaning this test can be used) for the duration of the COVID-19 declaration under Section 564(b)(1) of the Act, 21 U.S.C. section 360bbb-3(b)(1), unless the authorization is terminated or revoked.  Performed at [facility], [place] [address], [place], [place] [address]   Culture, blood (Routine x 2)     Status: None (Preliminary result)   Collection Time: 11/08/22  6:38 PM   Specimen: BLOOD  Result Value Ref Range Status   Specimen Description BLOOD RIGHT ANTECUBITAL  Final   Special Requests   Final    BOTTLES DRAWN AEROBIC AND ANAEROBIC Blood Culture adequate volume   Culture  Setup Time   Final    GRAM NEGATIVE RODS IN BOTH AEROBIC AND ANAEROBIC BOTTLES CRITICAL VALUE NOTED.  VALUE IS CONSISTENT WITH PREVIOUSLY REPORTED AND CALLED VALUE. Performed at [facility], [place] [address], [place], [place] [address]    Culture GRAM NEGATIVE RODS  Final   Report Status PENDING  Incomplete  Urine Culture (for pregnant, neutropenic or urologic patients or patients with an indwelling urinary catheter)     Status: Abnormal (Preliminary result)   Collection Time: 11/08/22  8:27 PM   Specimen: Urine, Clean Catch  Result Value Ref Range Status   Specimen Description URINE, CLEAN CATCH  Final   Special Requests NONE  Final   Culture (A)  Final    70,000 COLONIES/mL GRAM NEGATIVE RODS IDENTIFICATION AND SUSCEPTIBILITIES TO FOLLOW Performed at [facility], [place] [address], [place], [place] [address]    Report Status PENDING  Incomplete  Urine Culture (for pregnant, neutropenic or urologic patients or patients with an indwelling urinary catheter)     Status: Abnormal   Collection Time: 11/08/22 11:30 PM   Specimen: Urine, Clean Catch  Result Value Ref Range Status   Specimen Description URINE, CLEAN CATCH  Final   Special Requests   Final    NONE Performed at [facility], [place] [address] [place],  [address]    Culture (A)  Final    50,000 COLONIES/mL ESCHERICHIA COLI Confirmed Extended Spectrum Beta-Lactamase Producer (ESBL).  In bloodstream infections from ESBL organisms, carbapenems are preferred over piperacillin/tazobactam. They are shown to have a lower risk of mortality.    Report Status 11/10/2022  FINAL  Final   Organism ID, Bacteria ESCHERICHIA COLI (A)  Final      Susceptibility   Escherichia coli - MIC*    AMPICILLIN >=32 RESISTANT Resistant  CEFAZOLIN >=64 RESISTANT Resistant     CEFEPIME >=32 RESISTANT Resistant     CEFTRIAXONE >=64 RESISTANT Resistant     CIPROFLOXACIN >=4 RESISTANT Resistant     GENTAMICIN <=1 SENSITIVE Sensitive     IMIPENEM <=0.25 SENSITIVE Sensitive     NITROFURANTOIN <=16 SENSITIVE Sensitive     TRIMETH/SULFA >=320 RESISTANT Resistant     AMPICILLIN/SULBACTAM >=32 RESISTANT Resistant     PIP/TAZO 8 SENSITIVE Sensitive     * 50,000 COLONIES/mL ESCHERICHIA COLI  MRSA Next Gen by PCR, Nasal     Status: None   Collection Time: 11/08/22 11:30 PM   Specimen: Nasal Mucosa; Nasal Swab  Result Value Ref Range Status   MRSA by PCR Next Gen NOT DETECTED NOT DETECTED Final    Comment: (NOTE) The GeneXpert MRSA Assay (FDA approved for NASAL specimens only), is one component of a comprehensive MRSA colonization surveillance program. It is not intended to diagnose MRSA infection nor to guide or monitor treatment for MRSA infections. Test performance is not FDA approved in patients less than 17 years old. Performed at [facility], [place] [address] [place], [place] [address]     Radiology Studies: CT ABDOMEN PELVIS W CONTRAST  Result Date: 11/08/2022 CLINICAL DATA:  Urinary tract infection. EXAM: CT ABDOMEN AND PELVIS WITH CONTRAST TECHNIQUE: Multidetector CT imaging of the abdomen and pelvis was performed using the standard protocol following bolus administration of intravenous contrast. RADIATION DOSE REDUCTION: This exam was performed according to the departmental dose-optimization program which includes automated exposure control, adjustment of the mA and/or kV according to patient size and/or use of iterative reconstruction technique. CONTRAST:  21m OMNIPAQUE IOHEXOL 350 MG/ML SOLN COMPARISON:  CT abdomen pelvis dated 07/12/2018. FINDINGS: Lower  chest: The visualized lung bases are clear. No intra-abdominal free air or free fluid. Hepatobiliary: Apparent fatty liver. No biliary dilatation. The gallbladder is unremarkable. Pancreas: Unremarkable. No pancreatic ductal dilatation or surrounding inflammatory changes. Spleen: Normal in size without focal abnormality. Adrenals/Urinary Tract: The adrenal glands unremarkable. Similar appearance of predominantly exophytic angiomyolipoma arising from the inferior pole of the left kidney and measuring up to 4 cm. Small left renal upper pole cyst. Punctate nonobstructing right renal upper pole calculus. There is mild urothelial enhancement of the right ureter and renal collecting system concerning for pyelonephritis. Correlation with urinalysis recommended. No fluid collection or abscess. There is no hydronephrosis on either side. The urinary bladder is unremarkable. Stomach/Bowel: There is colonic diverticulosis without active inflammatory changes. There is moderate stool throughout the colon. There is no bowel obstruction or active inflammation. The appendix is normal. Vascular/Lymphatic: Mild aortoiliac atherosclerotic disease. The IVC is unremarkable. No portal venous gas. There is no adenopathy. Reproductive: Hysterectomy.  No adnexal masses. Other: Small fat containing umbilical hernia. Musculoskeletal: Degenerative changes of the spine. No acute osseous pathology. IMPRESSION: 1. Findings concerning for right-sided pyelonephritis. Correlation with urinalysis recommended. No hydronephrosis. 2. Punctate nonobstructing right renal upper pole calculus. 3. Colonic diverticulosis. No bowel obstruction. Normal appendix. 4. Similar appearance of predominantly exophytic angiomyolipoma arising from the inferior pole of the left kidney. 5.  Aortic Atherosclerosis (ICD10-I70.0). Electronically Signed   By: AAnner CreteM.D.   On: 11/08/2022 22:03   DG Elbow 2 Views Left  Result Date: 11/08/2022 CLINICAL DATA:  Pain,  no known injury EXAM: LEFT ELBOW - 2 VIEW COMPARISON:  None Available. 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 Joint effusion present. Bone fragments are noted anteriorly on  the lateral view. Cannot exclude avulsed fragments. No subluxation or dislocation. IMPRESSION: Advanced osteoarthritis. Left elbow joint effusion. Curvilinear bone fragments anteriorly on the lateral view, cannot exclude avulsed fragments. Electronically Signed   By: Rolm Baptise M.D.   On: 11/08/2022 21:15   DG Chest 2 View  Result Date: 11/08/2022 CLINICAL DATA:  Sepsis and shortness of breath. EXAM: CHEST - 2 VIEW COMPARISON:  06/02/2022 FINDINGS: The heart size and mediastinal contours are within normal limits. There is no evidence of pulmonary edema, consolidation, pneumothorax, nodule or pleural fluid. The visualized skeletal structures are unremarkable. IMPRESSION: No active cardiopulmonary disease. Electronically Signed   By: Aletta Edouard M.D.   On: 11/08/2022 15:35        Scheduled Meds:  cycloSPORINE  1 drop Both Eyes BID   docusate sodium  100 mg Oral Daily   DULoxetine  30 mg Oral Daily   enoxaparin (LOVENOX) injection  40 mg Subcutaneous Q24H   fluticasone  1 spray Each Nare Daily   gabapentin  300 mg Oral BID   mometasone-formoterol  2 puff Inhalation BID   oxyCODONE  20 mg Oral Q12H   pantoprazole  40 mg Oral Daily   psyllium  1 packet Oral Daily   senna-docusate  1 tablet Oral Q M,W,F-2000   Continuous Infusions:  meropenem (MERREM) IV 1 g (11/10/22 0601)   vancomycin 750 mg (11/09/22 2321)     LOS: 2 days    Time spent: 74mn    Channa Hazelett C Masiya Claassen, DO [facility]  If 7PM-7AM, please contact night-coverage [email or web address]  11/10/2022, 8:03 AM

## 2022-11-10 NOTE — Care Management Important Message (Signed)
Important Message  Patient Details  Name: [name] MRN: [id number] Date of Birth: Nov 16, 1950   Medicare Important Message Given:  Yes  Tried to call patient in the room no answer will mail to the patient home address/.    Orbie Pyo 11/10/2022, 2:15 PM

## 2022-11-10 NOTE — Evaluation (Signed)
Physical Therapy Evaluation Patient Details Name: [name] MRN: [id number] DOB: 1951/06/29 Today's Date: 11/10/2022  History of Present Illness  72 y.o. female admitted 2/7 for evaluation and treatment of sepsis secondary to suspected UTI. PMHx: rheumatoid arthritis, GERD, asthma, diverticulosis, obesity, sarcoidosis not on treatment.  Clinical Impression  Pt admitted with above diagnosis. Pt reports near baseline. Pt ambulating with RW, initially did need min assist after rising from bed and taking a couple of steps for balance however once cued for RW placement able to ambulate at min guard level 100 feet. Has progressed a very long way since prior admissions, working diligently with PT at [facility]. Will continue to progress. Pt currently with functional limitations due to the deficits listed below (see PT Problem List). Pt will benefit from skilled PT to increase their independence and safety with mobility to allow discharge to the venue listed below.          Recommendations for follow up therapy are one component of a multi-disciplinary discharge planning process, led by the attending physician.  Recommendations may be updated based on patient status, additional functional criteria and insurance authorization.  Follow Up Recommendations Home health PT (At ALF - Wishes to return to [place])      Assistance Recommended at Discharge Intermittent Supervision/Assistance  Patient can return home with the following  A little help with walking and/or transfers;A little help with bathing/dressing/bathroom;Assistance with cooking/housework;Assist for transportation;Help with stairs or ramp for entrance    Equipment Recommendations None recommended by PT  Recommendations for Other Services       Functional Status Assessment Patient has had a recent decline in their functional status and demonstrates the ability to make significant improvements in function in a  reasonable and predictable amount of time.     Precautions / Restrictions Precautions Precautions: Fall Restrictions Weight Bearing Restrictions: No      Mobility  Bed Mobility Overal bed mobility: Modified Independent             General bed mobility comments: exta time    Transfers Overall transfer level: Needs assistance Equipment used: Rollator (4 wheels) Transfers: Sit to/from Stand Sit to Stand: Min guard           General transfer comment: Close guard for safety. Appropriate use of RW for support upon standing.    Ambulation/Gait Ambulation/Gait assistance: Min assist, Min guard Gait Distance (Feet): 100 Feet Assistive device: Rolling walker (2 wheels) Gait Pattern/deviations: Step-through pattern, Drifts right/left Gait velocity: slow Gait velocity interpretation: <1.31 ft/sec, indicative of household ambulator   General Gait Details: Initially with min assist for posterior instability however progressed with min guard for duration of distance 100 feet. Educated on RW placement for increased support and anterior lean. No buckling noted. Cues for awareness of Rt drift, able to self correct intermittently.  Stairs            Wheelchair Mobility    Modified Rankin (Stroke Patients Only)       Balance Overall balance assessment: Needs assistance Sitting-balance support: No upper extremity supported, Feet supported Sitting balance-Leahy Scale: Good     Standing balance support: During functional activity, Reliant on assistive device for balance Standing balance-Leahy Scale: Poor                               Pertinent Vitals/Pain Pain Assessment Pain Assessment: No/denies pain    Home Living Family/patient expects  to be discharged to:: Other (Comment)                   Additional Comments: From [facility]. Working with PT there.    Prior Function Prior Level of Function : Needs assist              Mobility Comments: Uses RW, ambulates >400 feet, but is required to have supervision any time she ambulates. Other times uses her W/c to mobilize.       Hand Dominance   Dominant Hand: Right    Extremity/Trunk Assessment   Upper Extremity Assessment Upper Extremity Assessment: Defer to OT evaluation    Lower Extremity Assessment Lower Extremity Assessment: Generalized weakness       Communication   Communication: No difficulties  Cognition Arousal/Alertness: Awake/alert Behavior During Therapy: WFL for tasks assessed/performed Overall Cognitive Status: Within Functional Limits for tasks assessed                                          General Comments      Exercises General Exercises - Lower Extremity Ankle Circles/Pumps: Strengthening, Both, 10 reps, Supine Quad Sets: Strengthening, Both, 10 reps, Supine Gluteal Sets: Strengthening, Both, 10 reps, Supine   Assessment/Plan    PT Assessment Patient needs continued PT services  PT Problem List Decreased strength;Decreased range of motion;Decreased activity tolerance;Decreased balance;Decreased mobility;Decreased knowledge of use of DME;Obesity       PT Treatment Interventions DME instruction;Gait training;Functional mobility training;Therapeutic activities;Therapeutic exercise;Balance training;Neuromuscular re-education;Patient/family education    PT Goals (Current goals can be found in the Care Plan section)  Acute Rehab PT Goals Patient Stated Goal: Go back to ALF PT Goal Formulation: With patient Time For Goal Achievement: 11/17/22 Potential to Achieve Goals: Good    Frequency Min 3X/week     Co-evaluation               AM-PAC PT "6 Clicks" Mobility  Outcome Measure Help needed turning from your back to your side while in a flat bed without using bedrails?: None Help needed moving from lying on your back to sitting on the side of a flat bed without using bedrails?: None Help  needed moving to and from a bed to a chair (including a wheelchair)?: A Little Help needed standing up from a chair using your arms (e.g., wheelchair or bedside chair)?: A Little Help needed to walk in hospital room?: A Little Help needed climbing 3-5 steps with a railing? : A Lot 6 Click Score: 19    End of Session Equipment Utilized During Treatment: Gait belt Activity Tolerance: Patient tolerated treatment well Patient left: in chair;with call bell/phone within reach;with chair alarm set Nurse Communication: Mobility status PT Visit Diagnosis: Unsteadiness on feet (R26.81);Other abnormalities of gait and mobility (R26.89);Muscle weakness (generalized) (M62.81);Difficulty in walking, not elsewhere classified (R26.2)    Time: [id number] PT Time Calculation (min) (ACUTE ONLY): 34 min   Charges:   PT Evaluation $PT Eval Low Complexity: 1 Low PT Treatments $Gait Training: 8-22 mins        [name], PT, DPT Physical Therapist Acute Rehabilitation Services [place]   [name] 11/10/2022, 11:03 AM

## 2022-11-11 DIAGNOSIS — Z1612 Extended spectrum beta lactamase (ESBL) resistance: Secondary | ICD-10-CM | POA: Diagnosis not present

## 2022-11-11 DIAGNOSIS — R7881 Bacteremia: Secondary | ICD-10-CM | POA: Diagnosis not present

## 2022-11-11 DIAGNOSIS — N10 Acute tubulo-interstitial nephritis: Secondary | ICD-10-CM | POA: Diagnosis not present

## 2022-11-11 LAB — CULTURE, BLOOD (ROUTINE X 2)
Special Requests: ADEQUATE
Special Requests: ADEQUATE

## 2022-11-11 LAB — BASIC METABOLIC PANEL
Anion gap: 10 (ref 5–15)
BUN: 8 mg/dL (ref 8–23)
CO2: 23 mmol/L (ref 22–32)
Calcium: 8.3 mg/dL — ABNORMAL LOW (ref 8.9–10.3)
Chloride: 104 mmol/L (ref 98–111)
Creatinine, Ser: 0.62 mg/dL (ref 0.44–1.00)
GFR, Estimated: >60 mL/min (ref >60)
Glucose, Bld: 96 mg/dL (ref 70–99)
Potassium: 4.6 mmol/L (ref 3.5–5.1)
Sodium: 137 mmol/L (ref 135–145)

## 2022-11-11 LAB — CBC
HCT: 36.7 % (ref 36.0–46.0)
Hemoglobin: 11.3 g/dL — ABNORMAL LOW (ref 12.0–15.0)
MCH: 31 pg (ref 26.0–34.0)
MCHC: 30.8 g/dL (ref 30.0–36.0)
MCV: 100.8 fL — ABNORMAL HIGH (ref 80.0–100.0)
Platelets: 234 10*3/uL (ref 150–400)
RBC: 3.64 MIL/uL — ABNORMAL LOW (ref 3.87–5.11)
RDW: 16.7 % — ABNORMAL HIGH (ref 11.5–15.5)
WBC: 5.9 10*3/uL (ref 4.0–10.5)
nRBC: 0 % (ref 0.0–0.2)

## 2022-11-11 NOTE — Progress Notes (Signed)
PROGRESS NOTE    [name]  [id number] DOB: 1950-10-05 DOA: 11/08/2022 PCP: [name], MD   Brief Narrative:  [name] is a 72 y.o. female with medical history significant of rheumatoid arthritis, GERD, asthma, diverticulosis, obesity, sarcoidosis not on treatment, here with fevers and weakness. Admitted for evaluation and treatment of sepsis secondary to suspected UTI.  Assessment & Plan:   Principal Problem:   Bacteremia Active Problems:   Dyspnea   Infection due to ESBL-producing Escherichia coli   Acute pyelonephritis   Sepsis ([place])  Sepsis UTI -secondary to ESBL E. coli Bacteremia, POA Concurrent R sided pyelonephritis -Patient present with fever, tachycardia, tachypnea and weakness.  -UA is consistent with UTI; urine culture 70,000 colonies of ESBL E. coli -Immunosuppressed on Humira -CT abdomen pelvis concerning for pyelonephritis -Blood cultures positive for E. coli, bio fire indicates ESBL strain -ID sidelined, continue meropenem for now - plan for 7 days of therapy   Left elbow pain, unspecified Likely secondary to advanced osteoarthritis Rule out septic arthritis -Reports MRI outpatient with Dr. Marlou Sa early January - this appears to have been a C-spine image however not an elbow. She has plans to return to his office for repeat imaging. -Tender to palpation but without overt findings - on Humira so there is question of septic arthritis -Antibiotics as above, unlikely septic arthritis given obvious source as above although there is disseminated bacteremia increasing patient's risk of secondary infections- follow up clinically -Xray shows mild joint effusion and advanced arthritis - left elbow aspiration unsuccessful, no fluid was able to be obtained -pain appears to be resolving, likely arthritic in nature.  Acute metabolic encephalopathy, resolved  -Patient appears to be back to baseline, was having difficulty providing history overnight but  is now alert oriented x 4, able to provide appropriate history without difficulty  Hx of RA Arthritis -hold methothrexate -s/p Humira dose day of admission per report -Continue oxycodone 12-hour tablet 20 mg every 12 hours, oxycodone 10 mg every 4 hours as needed for severe pain -Gabapentin 300 mg twice daily -Cymbalta 30 mg daily -Bowel regimen Colace 100 mg daily, miralax prn, psyllium, senna-colace MWF   Hx of b/l knee replacement - Stable, anticoagulation ongoing, unclear etiology for chronic anticoagulation - Pharmacy was able to verify patient continues to be on Xarelto per [facility] PCP(who no longer works there) - Her last knee surgery was March 2023 - discussed with orthopedics - there is no indication for chronic anticoagulation from their standpoint - We will DC full dose anticoagulation given no clear indication at this time.   COPD -Not in acute exacerbation -Continue home Dulera   GERD -PPI   Normocytic anemia S/p 2 L fluids.  -partially dilutional, continue monitor   DVT prophylaxis: Lovenox Code Status: Full Family Communication: None present  Status is: Inpatient  Dispo: The patient is from: Home              Anticipated d/c is to: To be determined              Anticipated d/c date is: 48 to 72 hours pending clinical course and findings              Patient currently not medically stable for discharge given ongoing need for IV antibiotics further evaluation and possible imaging given disseminated infection as above  Consultants:  Infectious disease  Procedures:  None  Antimicrobials:  Meropenem, vancomycin ongoing  Subjective: No acute issues or events overnight, patient complaining  of bilateral hand pain consistent with her arthritis as well as pain at her right forearm IV site.  She notes her left elbow pain is markedly improving but not yet resolved.  She otherwise denies nausea vomiting diarrhea constipation headache fevers chills or chest  pain  Objective: Vitals:   11/10/22 1753 11/10/22 2059 11/10/22 2205 11/11/22 0608  BP: (!) 141/64  (!) 141/81 (!) 141/87  Pulse: 83 82 98 78  Resp: 17  18 18  $ Temp: 98.2 F (36.8 C)  97.9 F (36.6 C) 98.5 F (36.9 C)  TempSrc:   Oral   SpO2: 98% 97% 96% 94%  Weight:      Height:        Intake/Output Summary (Last 24 hours) at 11/11/2022 0822 Last data filed at 11/11/2022 0653 Gross per 24 hour  Intake 1000 ml  Output 1000 ml  Net 0 ml    Filed Weights   11/08/22 1451 11/08/22 2318  Weight: 81.6 kg 87.2 kg    Examination:  General:  Pleasantly resting in bed, No acute distress. HEENT:  Normocephalic atraumatic.  Sclerae nonicteric, noninjected.  Extraocular movements intact bilaterally. Neck:  Without mass or deformity.  Trachea is midline. Lungs:  Clear to auscultate bilaterally without rhonchi, wheeze, or rales. Heart:  Regular rate and rhythm.  Without murmurs, rubs, or gallops. Abdomen:  Soft, nontender, nondistended.  Without guarding or rebound. Extremities: Left elbow tender to palpation, range of motion intact strength intact. Vascular:  Dorsalis pedis and posterior tibial pulses palpable bilaterally. Skin:  Warm and dry, no erythema, no ulcerations.  Data Reviewed: I have personally reviewed following labs and imaging studies  CBC: Recent Labs  Lab 11/08/22 1500 11/09/22 0023 11/11/22 0700  WBC 8.7 17.4* 5.9  NEUTROABS 7.0  --   --   HGB 11.8* 10.5* 11.3*  HCT 38.2 32.4* 36.7  MCV 97.9 95.9 100.8*  PLT 320 280 [id number]    Basic Metabolic Panel: Recent Labs  Lab 11/08/22 1500 11/09/22 0023 11/11/22 0700  NA 135 134* 137  K 4.2 3.7 4.6  CL 99 101 104  CO2 23 22 23  $ GLUCOSE 117* 160* 96  BUN 10 8 8  $ CREATININE 0.69 0.59 0.62  CALCIUM 8.7* 8.0* 8.3*    GFR: Estimated Creatinine Clearance: 61.5 mL/min (by C-G formula based on SCr of 0.62 mg/dL). Liver Function Tests: Recent Labs  Lab 11/08/22 1500 11/09/22 0023  AST 37 26  ALT 26 20   ALKPHOS 87 75  BILITOT 0.7 0.7  PROT 7.2 6.0*  ALBUMIN 3.8 2.9*    No results for input(s): "LIPASE", "AMYLASE" in the last 168 hours. No results for input(s): "AMMONIA" in the last 168 hours. Coagulation Profile: Recent Labs  Lab 11/08/22 1500  INR 1.5*    Thyroid Function Tests: Recent Labs    11/09/22 0023  TSH 0.918    Sepsis Labs: Recent Labs  Lab 11/08/22 1500 11/08/22 1838 11/09/22 0023  PROCALCITON  --   --  4.79  LATICACIDVEN 1.6 1.3  --      Recent Results (from the past 240 hour(s))  Culture, blood (Routine x 2)     Status: Abnormal   Collection Time: 11/08/22  3:07 PM   Specimen: BLOOD RIGHT FOREARM  Result Value Ref Range Status   Specimen Description BLOOD RIGHT FOREARM  Final   Special Requests   Final    BOTTLES DRAWN AEROBIC AND ANAEROBIC Blood Culture adequate volume   Culture  Setup Time  Final    GRAM NEGATIVE RODS IN BOTH AEROBIC AND ANAEROBIC BOTTLES CRITICAL RESULT CALLED TO, READ BACK BY AND VERIFIED WITH: PHARMD G. LEDFORD 11/09/22 @ 0602 BY AB Performed at [facility], [place] [address], [place], [place] [address]    Culture (A)  Final    ESCHERICHIA COLI Confirmed Extended Spectrum Beta-Lactamase Producer (ESBL).  In bloodstream infections from ESBL organisms, carbapenems are preferred over piperacillin/tazobactam. They are shown to have a lower risk of mortality.    Report Status 11/11/2022 FINAL  Final   Organism ID, Bacteria ESCHERICHIA COLI  Final      Susceptibility   Escherichia coli - MIC*    AMPICILLIN >=32 RESISTANT Resistant     CEFAZOLIN >=64 RESISTANT Resistant     CEFEPIME >=32 RESISTANT Resistant     CEFTAZIDIME >=64 RESISTANT Resistant     CEFTRIAXONE >=64 RESISTANT Resistant     CIPROFLOXACIN >=4 RESISTANT Resistant     GENTAMICIN <=1 SENSITIVE Sensitive     IMIPENEM <=0.25 SENSITIVE Sensitive     TRIMETH/SULFA >=320 RESISTANT Resistant     AMPICILLIN/SULBACTAM >=32 RESISTANT Resistant     PIP/TAZO 8  SENSITIVE Sensitive     * ESCHERICHIA COLI  Blood Culture ID Panel (Reflexed)     Status: Abnormal   Collection Time: 11/08/22  3:07 PM  Result Value Ref Range Status   Enterococcus faecalis NOT DETECTED NOT DETECTED Final   Enterococcus Faecium NOT DETECTED NOT DETECTED Final   Listeria monocytogenes NOT DETECTED NOT DETECTED Final   Staphylococcus species NOT DETECTED NOT DETECTED Final   Staphylococcus aureus (BCID) NOT DETECTED NOT DETECTED Final   Staphylococcus epidermidis NOT DETECTED NOT DETECTED Final   Staphylococcus lugdunensis NOT DETECTED NOT DETECTED Final   Streptococcus species NOT DETECTED NOT DETECTED Final   Streptococcus agalactiae NOT DETECTED NOT DETECTED Final   Streptococcus pneumoniae NOT DETECTED NOT DETECTED Final   Streptococcus pyogenes NOT DETECTED NOT DETECTED Final   A.calcoaceticus-baumannii NOT DETECTED NOT DETECTED Final   Bacteroides fragilis NOT DETECTED NOT DETECTED Final   Enterobacterales DETECTED (A) NOT DETECTED Final    Comment: Enterobacterales represent a large order of gram negative bacteria, not a single organism. CRITICAL RESULT CALLED TO, READ BACK BY AND VERIFIED WITH: PHARMD G. LEDFORD 11/09/22 @ 0602 BY AB    Enterobacter cloacae complex NOT DETECTED NOT DETECTED Final   Escherichia coli DETECTED (A) NOT DETECTED Final    Comment: CRITICAL RESULT CALLED TO, READ BACK BY AND VERIFIED WITH: PHARMD G. LEDFORD 11/09/22 @ 0602 BY AB    Klebsiella aerogenes NOT DETECTED NOT DETECTED Final   Klebsiella oxytoca NOT DETECTED NOT DETECTED Final   Klebsiella pneumoniae NOT DETECTED NOT DETECTED Final   Proteus species NOT DETECTED NOT DETECTED Final   Salmonella species NOT DETECTED NOT DETECTED Final   Serratia marcescens NOT DETECTED NOT DETECTED Final   Haemophilus influenzae NOT DETECTED NOT DETECTED Final   Neisseria meningitidis NOT DETECTED NOT DETECTED Final   Pseudomonas aeruginosa NOT DETECTED NOT DETECTED Final   Stenotrophomonas  maltophilia NOT DETECTED NOT DETECTED Final   Candida albicans NOT DETECTED NOT DETECTED Final   Candida auris NOT DETECTED NOT DETECTED Final   Candida glabrata NOT DETECTED NOT DETECTED Final   Candida krusei NOT DETECTED NOT DETECTED Final   Candida parapsilosis NOT DETECTED NOT DETECTED Final   Candida tropicalis NOT DETECTED NOT DETECTED Final   Cryptococcus neoformans/gattii NOT DETECTED NOT DETECTED Final   CTX-M ESBL DETECTED (A) NOT DETECTED  Final    Comment: CRITICAL RESULT CALLED TO, READ BACK BY AND VERIFIED WITH: PHARMD G. LEDFORD 11/09/22 @ 0602 BY AB (NOTE) Extended spectrum beta-lactamase detected. Recommend a carbapenem as initial therapy.      Carbapenem resistance IMP NOT DETECTED NOT DETECTED Final   Carbapenem resistance KPC NOT DETECTED NOT DETECTED Final   Carbapenem resistance NDM NOT DETECTED NOT DETECTED Final   Carbapenem resist OXA 48 LIKE NOT DETECTED NOT DETECTED Final   Carbapenem resistance VIM NOT DETECTED NOT DETECTED Final    Comment: Performed at [facility], [place] [address] [place], [place] [address]  Resp panel by RT-PCR (RSV, Flu A&B, Covid) Anterior Nasal Swab     Status: None   Collection Time: 11/08/22  3:12 PM   Specimen: Anterior Nasal Swab  Result Value Ref Range Status   SARS Coronavirus 2 by RT PCR NEGATIVE NEGATIVE Final   Influenza A by PCR NEGATIVE NEGATIVE Final   Influenza B by PCR NEGATIVE NEGATIVE Final    Comment: (NOTE) The Xpert Xpress SARS-CoV-2/FLU/RSV plus assay is intended as an aid in the diagnosis of influenza from Nasopharyngeal swab specimens and should not be used as a sole basis for treatment. Nasal washings and aspirates are unacceptable for Xpert Xpress SARS-CoV-2/FLU/RSV testing.  Fact Sheet for Patients: EntrepreneurPulse.com.au  Fact Sheet for Healthcare Providers: IncredibleEmployment.be  This test is not yet approved or cleared by the [place] FDA and has  been authorized for detection and/or diagnosis of SARS-CoV-2 by FDA under an Emergency Use Authorization (EUA). This EUA will remain in effect (meaning this test can be used) for the duration of the COVID-19 declaration under Section 564(b)(1) of the Act, 21 U.S.C. section 360bbb-3(b)(1), unless the authorization is terminated or revoked.     Resp Syncytial Virus by PCR NEGATIVE NEGATIVE Final    Comment: (NOTE) Fact Sheet for Patients: EntrepreneurPulse.com.au  Fact Sheet for Healthcare Providers: IncredibleEmployment.be  This test is not yet approved or cleared by the [place] FDA and has been authorized for detection and/or diagnosis of SARS-CoV-2 by FDA under an Emergency Use Authorization (EUA). This EUA will remain in effect (meaning this test can be used) for the duration of the COVID-19 declaration under Section 564(b)(1) of the Act, 21 U.S.C. section 360bbb-3(b)(1), unless the authorization is terminated or revoked.  Performed at [facility], [place] [address] [place], [place] [address]   Culture, blood (Routine x 2)     Status: Abnormal   Collection Time: 11/08/22  6:38 PM   Specimen: BLOOD  Result Value Ref Range Status   Specimen Description BLOOD RIGHT ANTECUBITAL  Final   Special Requests   Final    BOTTLES DRAWN AEROBIC AND ANAEROBIC Blood Culture adequate volume   Culture  Setup Time   Final    GRAM NEGATIVE RODS IN BOTH AEROBIC AND ANAEROBIC BOTTLES CRITICAL VALUE NOTED.  VALUE IS CONSISTENT WITH PREVIOUSLY REPORTED AND CALLED VALUE.    Culture (A)  Final    ESCHERICHIA COLI SUSCEPTIBILITIES PERFORMED ON PREVIOUS CULTURE WITHIN THE LAST 5 DAYS. Performed at [facility], [place] [address] [place], [place] [address]    Report Status 11/11/2022 FINAL  Final  Urine Culture (for pregnant, neutropenic or urologic patients or patients with an indwelling urinary catheter)     Status: Abnormal   Collection Time:  11/08/22  8:27 PM   Specimen: Urine, Clean Catch  Result Value Ref Range Status   Specimen Description URINE, CLEAN CATCH  Final   Special Requests   Final    NONE Performed at [facility], [place] [address], [place], [place] [address]    Culture (A)  Final    70,000 COLONIES/mL ESCHERICHIA COLI Confirmed Extended Spectrum Beta-Lactamase Producer (ESBL).  In bloodstream infections from ESBL organisms, carbapenems are preferred over piperacillin/tazobactam. They are shown to have a lower risk of mortality.    Report Status 11/10/2022 FINAL  Final   Organism ID, Bacteria ESCHERICHIA COLI (A)  Final      Susceptibility   Escherichia coli - MIC*    AMPICILLIN >=32 RESISTANT Resistant     CEFAZOLIN >=64 RESISTANT Resistant     CEFEPIME >=32 RESISTANT Resistant     CEFTRIAXONE >=64 RESISTANT Resistant     CIPROFLOXACIN >=4 RESISTANT Resistant     GENTAMICIN <=1 SENSITIVE Sensitive     IMIPENEM <=0.25 SENSITIVE Sensitive     NITROFURANTOIN <=16 SENSITIVE Sensitive     TRIMETH/SULFA >=320 RESISTANT Resistant     AMPICILLIN/SULBACTAM >=32 RESISTANT Resistant     PIP/TAZO 8 SENSITIVE Sensitive     * 70,000 COLONIES/mL ESCHERICHIA COLI  Urine Culture (for pregnant, neutropenic or urologic patients or patients with an indwelling urinary catheter)     Status: Abnormal   Collection Time: 11/08/22 11:30 PM   Specimen: Urine, Clean Catch  Result Value Ref Range Status   Specimen Description URINE, CLEAN CATCH  Final   Special Requests   Final    NONE Performed at [facility], [address] [place], [place] [address]    Culture (A)  Final    50,000 COLONIES/mL ESCHERICHIA COLI Confirmed Extended Spectrum Beta-Lactamase Producer (ESBL).  In bloodstream infections from ESBL organisms, carbapenems are preferred over piperacillin/tazobactam. They are shown to have a lower risk of mortality.    Report Status 11/10/2022 FINAL  Final   Organism ID, Bacteria ESCHERICHIA COLI (A)  Final       Susceptibility   Escherichia coli - MIC*    AMPICILLIN >=32 RESISTANT Resistant     CEFAZOLIN >=64 RESISTANT Resistant     CEFEPIME >=32 RESISTANT Resistant     CEFTRIAXONE >=64 RESISTANT Resistant     CIPROFLOXACIN >=4 RESISTANT Resistant     GENTAMICIN <=1 SENSITIVE Sensitive     IMIPENEM <=0.25 SENSITIVE Sensitive     NITROFURANTOIN <=16 SENSITIVE Sensitive     TRIMETH/SULFA >=320 RESISTANT Resistant     AMPICILLIN/SULBACTAM >=32 RESISTANT Resistant     PIP/TAZO 8 SENSITIVE Sensitive     * 50,000 COLONIES/mL ESCHERICHIA COLI  MRSA Next Gen by PCR, Nasal     Status: None   Collection Time: 11/08/22 11:30 PM   Specimen: Nasal Mucosa; Nasal Swab  Result Value Ref Range Status   MRSA by PCR Next Gen NOT DETECTED NOT DETECTED Final    Comment: (NOTE) The GeneXpert MRSA Assay (FDA approved for NASAL specimens only), is one component of a comprehensive MRSA colonization surveillance program. It is not intended to diagnose MRSA infection nor to guide or monitor treatment for MRSA infections. Test performance is not FDA approved in patients less than 63 years old. Performed at [facility], [place] [address], [place],  [address]     Radiology Studies: DG FLUORO GUIDED NEEDLE [facility] ASPIRATION/INJECTION LOC  Result Date: 11/10/2022 CLINICAL DATA:  Left elbow pain since EMG testing last month, effusion on plain film, on Humira, admitted with UTI bacteremia EXAM: LEFT ELBOW ASPIRATION UNDER FLUOROSCOPY FLUOROSCOPY: 0.70 mGy TECHNIQUE: The  procedure, risks (including but not limited to bleeding, infection, structural damage, pain, inability to access the joint), benefits, and alternatives were explained to the patient. Questions regarding the procedure were encouraged and answered. The patient understands and consents to the procedure. An appropriate skin entry site was determined under fluoroscopy. Site was marked, prepped and draped in usual sterile fashion, and infiltrated  locally with 1% lidocaine. A 20 gauge needle was advanced into the joint. No synovial fluid was acquired. A small amount of contrast was injected to demonstrate intra-articular placement.A fluoroscopic image was saved for documentation.The needle was removed and site bandaged. The patient tolerated procedure well. COMPLICATIONS: None immediate IMPRESSION: Fluoro guided aspiration of left elbow without synovial fluid obtained via 20 gauge needle despite confirmed intra-articular needle placement. Given evidence of effusion on plain film, should suspicion remain for septic joint, may consider repeat aspiration under [name] to target collection directly. This would need to be arranged with a musculoskeletal radiologist on their scheduled day in the facility. Findings communicated with ordering provider after procedure performed via [place]. Read and performed by: Alexandria Lodge, PA-C Electronically Signed   By: Van Clines M.D.   On: 11/10/2022 15:16        Scheduled Meds:  cycloSPORINE  1 drop Both Eyes BID   docusate sodium  100 mg Oral Daily   DULoxetine  30 mg Oral Daily   enoxaparin (LOVENOX) injection  40 mg Subcutaneous Q24H   fluticasone  1 spray Each Nare Daily   gabapentin  300 mg Oral BID   mometasone-formoterol  2 puff Inhalation BID   oxyCODONE  20 mg Oral Q12H   pantoprazole  40 mg Oral Daily   psyllium  1 packet Oral Daily   senna-docusate  1 tablet Oral Q M,W,F-2000   Continuous Infusions:  meropenem (MERREM) IV 1 g (11/11/22 0549)   vancomycin 750 mg (11/11/22 0707)     LOS: 3 days    Time spent: 53mn    Darrio Bade C Almin Livingstone, DO [facility]  If 7PM-7AM, please contact night-coverage [email or web address]  11/11/2022, 8:22 AM

## 2022-11-12 DIAGNOSIS — Z1612 Extended spectrum beta lactamase (ESBL) resistance: Secondary | ICD-10-CM | POA: Diagnosis not present

## 2022-11-12 DIAGNOSIS — N10 Acute tubulo-interstitial nephritis: Secondary | ICD-10-CM | POA: Diagnosis not present

## 2022-11-12 DIAGNOSIS — R7881 Bacteremia: Secondary | ICD-10-CM | POA: Diagnosis not present

## 2022-11-12 LAB — BASIC METABOLIC PANEL
Anion gap: 9 (ref 5–15)
BUN: <5 mg/dL — ABNORMAL LOW (ref 8–23)
CO2: 26 mmol/L (ref 22–32)
Calcium: 8.2 mg/dL — ABNORMAL LOW (ref 8.9–10.3)
Chloride: 103 mmol/L (ref 98–111)
Creatinine, Ser: 0.51 mg/dL (ref 0.44–1.00)
GFR, Estimated: >60 mL/min (ref >60)
Glucose, Bld: 118 mg/dL — ABNORMAL HIGH (ref 70–99)
Potassium: 3.7 mmol/L (ref 3.5–5.1)
Sodium: 138 mmol/L (ref 135–145)

## 2022-11-12 LAB — CBC
HCT: 35 % — ABNORMAL LOW (ref 36.0–46.0)
Hemoglobin: 10.6 g/dL — ABNORMAL LOW (ref 12.0–15.0)
MCH: 29.7 pg (ref 26.0–34.0)
MCHC: 30.3 g/dL (ref 30.0–36.0)
MCV: 98 fL (ref 80.0–100.0)
Platelets: 265 10*3/uL (ref 150–400)
RBC: 3.57 MIL/uL — ABNORMAL LOW (ref 3.87–5.11)
RDW: 16.3 % — ABNORMAL HIGH (ref 11.5–15.5)
WBC: 4 10*3/uL (ref 4.0–10.5)
nRBC: 0 % (ref 0.0–0.2)

## 2022-11-12 MED ORDER — SODIUM CHLORIDE 0.9 % IV SOLN
1.0000 g | INTRAVENOUS | Status: DC
  Administered 2022-11-12 – 2022-11-15 (×4): 1000 mg via INTRAVENOUS
  Filled 2022-11-12 (×4): qty 1

## 2022-11-12 NOTE — Progress Notes (Signed)
PROGRESS NOTE    [name]  [id number] DOB: 21-Dec-1950 DOA: 11/08/2022 PCP: [name], MD   Brief Narrative:  [name] is a 72 y.o. female with medical history significant of rheumatoid arthritis, GERD, asthma, diverticulosis, obesity, sarcoidosis not on treatment, here with fevers and weakness. Admitted for evaluation and treatment of sepsis secondary to suspected UTI.  Assessment & Plan:   Principal Problem:   Bacteremia Active Problems:   Dyspnea   Infection due to ESBL-producing Escherichia coli   Acute pyelonephritis   Sepsis ([place])  Sepsis UTI -secondary to ESBL E. coli Bacteremia, POA Concurrent R sided pyelonephritis -Patient present with fever, tachycardia, tachypnea and weakness.  -UA is consistent with UTI; urine culture 70,000 colonies of ESBL E. coli -Immunosuppressed on Humira -CT abdomen pelvis concerning for pyelonephritis -Blood cultures positive for E. coli, bio fire indicates ESBL strain; cultures confirm -ID sidelined, transition to Invanz for 10 days of therapy   Left elbow pain, unspecified Likely secondary to advanced osteoarthritis Rule out septic arthritis -Reports MRI outpatient with Dr. Marlou Sa early January - this appears to have been a C-spine image however not an elbow. She has plans to return to his office for repeat imaging. -Tender to palpation but without overt findings - on Humira so there is question of septic arthritis -Antibiotics as above, unlikely septic arthritis given obvious source as above although there is disseminated bacteremia increasing patient's risk of secondary infections- follow up clinically -Xray shows mild joint effusion and advanced arthritis - left elbow aspiration unsuccessful, no fluid was able to be obtained -pain appears to be resolving, likely arthritic in nature.  Acute metabolic encephalopathy, resolved  -Patient appears to be back to baseline, was having difficulty providing history  overnight but is now alert oriented x 4, able to provide appropriate history without difficulty  Hx of RA Arthritis -hold methothrexate -s/p Humira dose day of admission per report -Continue oxycodone 12-hour tablet 20 mg every 12 hours, oxycodone 10 mg every 4 hours as needed for severe pain -Gabapentin 300 mg twice daily -Cymbalta 30 mg daily -Bowel regimen Colace 100 mg daily, miralax prn, psyllium, senna-colace MWF   Hx of b/l knee replacement - Stable, anticoagulation ongoing, unclear etiology for chronic anticoagulation - Pharmacy was able to verify patient continues to be on Xarelto per [facility] PCP(who no longer works there) - Her last knee surgery was March 2023 - discussed with orthopedics - there is no indication for chronic anticoagulation from their standpoint - We will DC full dose anticoagulation given no clear indication at this time.   COPD -Not in acute exacerbation -Continue home Dulera   GERD -PPI   Normocytic anemia S/p 2 L fluids.  -partially dilutional, continue monitor   DVT prophylaxis: Lovenox Code Status: Full Family Communication: None present  Status is: Inpatient  Dispo: The patient is from: Home              Anticipated d/c is to: To be determined              Anticipated d/c date is: 48 to 72 hours pending clinical course and findings              Patient currently not medically stable for discharge given ongoing need for IV antibiotics further evaluation and possible imaging given disseminated infection as above  Consultants:  Infectious disease  Procedures:  None  Antimicrobials:  Invanz - continue for 10 day total course (merropenem/vancomycin stopped).  Subjective: No acute  issues or events overnight denies nausea vomiting diarrhea constipation headache fevers chills or chest pain.  Wrist and elbow pain have resolved.  Objective: Vitals:   11/11/22 1632 11/11/22 2119 11/11/22 2134 11/12/22 0532  BP: (!) 147/86  (!) 149/77 (!)  156/77  Pulse: 96  90 85  Resp: 18  20 20  $ Temp: 98.4 F (36.9 C)  98.1 F (36.7 C) 98.1 F (36.7 C)  TempSrc:   Oral Oral  SpO2: 96% 98% 93% 96%  Weight:      Height:        Intake/Output Summary (Last 24 hours) at 11/12/2022 0817 Last data filed at 11/12/2022 0631 Gross per 24 hour  Intake 740 ml  Output 500 ml  Net 240 ml    Filed Weights   11/08/22 1451 11/08/22 2318  Weight: 81.6 kg 87.2 kg    Examination:  General:  Pleasantly resting in bed, No acute distress. HEENT:  Normocephalic atraumatic.  Sclerae nonicteric, noninjected.  Extraocular movements intact bilaterally. Neck:  Without mass or deformity.  Trachea is midline. Lungs:  Clear to auscultate bilaterally without rhonchi, wheeze, or rales. Heart:  Regular rate and rhythm.  Without murmurs, rubs, or gallops. Abdomen:  Soft, nontender, nondistended.  Without guarding or rebound. Extremities: Left elbow tender to palpation, range of motion intact strength intact. Vascular:  Dorsalis pedis and posterior tibial pulses palpable bilaterally. Skin:  Warm and dry, no erythema, no ulcerations.  Data Reviewed: I have personally reviewed following labs and imaging studies  CBC: Recent Labs  Lab 11/08/22 1500 11/09/22 0023 11/11/22 0700  WBC 8.7 17.4* 5.9  NEUTROABS 7.0  --   --   HGB 11.8* 10.5* 11.3*  HCT 38.2 32.4* 36.7  MCV 97.9 95.9 100.8*  PLT 320 280 [id number]    Basic Metabolic Panel: Recent Labs  Lab 11/08/22 1500 11/09/22 0023 11/11/22 0700  NA 135 134* 137  K 4.2 3.7 4.6  CL 99 101 104  CO2 23 22 23  $ GLUCOSE 117* 160* 96  BUN 10 8 8  $ CREATININE 0.69 0.59 0.62  CALCIUM 8.7* 8.0* 8.3*    GFR: Estimated Creatinine Clearance: 61.5 mL/min (by C-G formula based on SCr of 0.62 mg/dL). Liver Function Tests: Recent Labs  Lab 11/08/22 1500 11/09/22 0023  AST 37 26  ALT 26 20  ALKPHOS 87 75  BILITOT 0.7 0.7  PROT 7.2 6.0*  ALBUMIN 3.8 2.9*    No results for input(s): "LIPASE", "AMYLASE" in  the last 168 hours. No results for input(s): "AMMONIA" in the last 168 hours. Coagulation Profile: Recent Labs  Lab 11/08/22 1500  INR 1.5*    Thyroid Function Tests: No results for input(s): "TSH", "T4TOTAL", "FREET4", "T3FREE", "THYROIDAB" in the last 72 hours.  Sepsis Labs: Recent Labs  Lab 11/08/22 1500 11/08/22 1838 11/09/22 0023  PROCALCITON  --   --  4.79  LATICACIDVEN 1.6 1.3  --      Recent Results (from the past 240 hour(s))  Culture, blood (Routine x 2)     Status: Abnormal   Collection Time: 11/08/22  3:07 PM   Specimen: BLOOD RIGHT FOREARM  Result Value Ref Range Status   Specimen Description BLOOD RIGHT FOREARM  Final   Special Requests   Final    BOTTLES DRAWN AEROBIC AND ANAEROBIC Blood Culture adequate volume   Culture  Setup Time   Final    GRAM NEGATIVE RODS IN BOTH AEROBIC AND ANAEROBIC BOTTLES CRITICAL RESULT CALLED TO, READ BACK BY AND VERIFIED  WITH: PHARMD G. LEDFORD 11/09/22 @ 0602 BY AB Performed at [facility], [place] [address] [place], [place] [address]    Culture (A)  Final    ESCHERICHIA COLI Confirmed Extended Spectrum Beta-Lactamase Producer (ESBL).  In bloodstream infections from ESBL organisms, carbapenems are preferred over piperacillin/tazobactam. They are shown to have a lower risk of mortality.    Report Status 11/11/2022 FINAL  Final   Organism ID, Bacteria ESCHERICHIA COLI  Final      Susceptibility   Escherichia coli - MIC*    AMPICILLIN >=32 RESISTANT Resistant     CEFAZOLIN >=64 RESISTANT Resistant     CEFEPIME >=32 RESISTANT Resistant     CEFTAZIDIME >=64 RESISTANT Resistant     CEFTRIAXONE >=64 RESISTANT Resistant     CIPROFLOXACIN >=4 RESISTANT Resistant     GENTAMICIN <=1 SENSITIVE Sensitive     IMIPENEM <=0.25 SENSITIVE Sensitive     TRIMETH/SULFA >=320 RESISTANT Resistant     AMPICILLIN/SULBACTAM >=32 RESISTANT Resistant     PIP/TAZO 8 SENSITIVE Sensitive     * ESCHERICHIA COLI  Blood Culture ID Panel  (Reflexed)     Status: Abnormal   Collection Time: 11/08/22  3:07 PM  Result Value Ref Range Status   Enterococcus faecalis NOT DETECTED NOT DETECTED Final   Enterococcus Faecium NOT DETECTED NOT DETECTED Final   Listeria monocytogenes NOT DETECTED NOT DETECTED Final   Staphylococcus species NOT DETECTED NOT DETECTED Final   Staphylococcus aureus (BCID) NOT DETECTED NOT DETECTED Final   Staphylococcus epidermidis NOT DETECTED NOT DETECTED Final   Staphylococcus lugdunensis NOT DETECTED NOT DETECTED Final   Streptococcus species NOT DETECTED NOT DETECTED Final   Streptococcus agalactiae NOT DETECTED NOT DETECTED Final   Streptococcus pneumoniae NOT DETECTED NOT DETECTED Final   Streptococcus pyogenes NOT DETECTED NOT DETECTED Final   A.calcoaceticus-baumannii NOT DETECTED NOT DETECTED Final   Bacteroides fragilis NOT DETECTED NOT DETECTED Final   Enterobacterales DETECTED (A) NOT DETECTED Final    Comment: Enterobacterales represent a large order of gram negative bacteria, not a single organism. CRITICAL RESULT CALLED TO, READ BACK BY AND VERIFIED WITH: PHARMD G. LEDFORD 11/09/22 @ 0602 BY AB    Enterobacter cloacae complex NOT DETECTED NOT DETECTED Final   Escherichia coli DETECTED (A) NOT DETECTED Final    Comment: CRITICAL RESULT CALLED TO, READ BACK BY AND VERIFIED WITH: PHARMD G. LEDFORD 11/09/22 @ 0602 BY AB    Klebsiella aerogenes NOT DETECTED NOT DETECTED Final   Klebsiella oxytoca NOT DETECTED NOT DETECTED Final   Klebsiella pneumoniae NOT DETECTED NOT DETECTED Final   Proteus species NOT DETECTED NOT DETECTED Final   Salmonella species NOT DETECTED NOT DETECTED Final   Serratia marcescens NOT DETECTED NOT DETECTED Final   Haemophilus influenzae NOT DETECTED NOT DETECTED Final   Neisseria meningitidis NOT DETECTED NOT DETECTED Final   Pseudomonas aeruginosa NOT DETECTED NOT DETECTED Final   Stenotrophomonas maltophilia NOT DETECTED NOT DETECTED Final   Candida albicans NOT  DETECTED NOT DETECTED Final   Candida auris NOT DETECTED NOT DETECTED Final   Candida glabrata NOT DETECTED NOT DETECTED Final   Candida krusei NOT DETECTED NOT DETECTED Final   Candida parapsilosis NOT DETECTED NOT DETECTED Final   Candida tropicalis NOT DETECTED NOT DETECTED Final   Cryptococcus neoformans/gattii NOT DETECTED NOT DETECTED Final   CTX-M ESBL DETECTED (A) NOT DETECTED Final    Comment: CRITICAL RESULT CALLED TO, READ BACK BY AND VERIFIED WITH: PHARMD G. LEDFORD 11/09/22 @ 0602 BY  AB (NOTE) Extended spectrum beta-lactamase detected. Recommend a carbapenem as initial therapy.      Carbapenem resistance IMP NOT DETECTED NOT DETECTED Final   Carbapenem resistance KPC NOT DETECTED NOT DETECTED Final   Carbapenem resistance NDM NOT DETECTED NOT DETECTED Final   Carbapenem resist OXA 48 LIKE NOT DETECTED NOT DETECTED Final   Carbapenem resistance VIM NOT DETECTED NOT DETECTED Final    Comment: Performed at [facility], [place] [address] [place], [place] [address]  Resp panel by RT-PCR (RSV, Flu A&B, Covid) Anterior Nasal Swab     Status: None   Collection Time: 11/08/22  3:12 PM   Specimen: Anterior Nasal Swab  Result Value Ref Range Status   SARS Coronavirus 2 by RT PCR NEGATIVE NEGATIVE Final   Influenza A by PCR NEGATIVE NEGATIVE Final   Influenza B by PCR NEGATIVE NEGATIVE Final    Comment: (NOTE) The Xpert Xpress SARS-CoV-2/FLU/RSV plus assay is intended as an aid in the diagnosis of influenza from Nasopharyngeal swab specimens and should not be used as a sole basis for treatment. Nasal washings and aspirates are unacceptable for Xpert Xpress SARS-CoV-2/FLU/RSV testing.  Fact Sheet for Patients: EntrepreneurPulse.com.au  Fact Sheet for Healthcare Providers: IncredibleEmployment.be  This test is not yet approved or cleared by the [place] FDA and has been authorized for detection and/or diagnosis of SARS-CoV-2 by FDA  under an Emergency Use Authorization (EUA). This EUA will remain in effect (meaning this test can be used) for the duration of the COVID-19 declaration under Section 564(b)(1) of the Act, 21 U.S.C. section 360bbb-3(b)(1), unless the authorization is terminated or revoked.     Resp Syncytial Virus by PCR NEGATIVE NEGATIVE Final    Comment: (NOTE) Fact Sheet for Patients: EntrepreneurPulse.com.au  Fact Sheet for Healthcare Providers: IncredibleEmployment.be  This test is not yet approved or cleared by the [place] FDA and has been authorized for detection and/or diagnosis of SARS-CoV-2 by FDA under an Emergency Use Authorization (EUA). This EUA will remain in effect (meaning this test can be used) for the duration of the COVID-19 declaration under Section 564(b)(1) of the Act, 21 U.S.C. section 360bbb-3(b)(1), unless the authorization is terminated or revoked.  Performed at [facility], [place] [address], [place], [place] [address]   Culture, blood (Routine x 2)     Status: Abnormal   Collection Time: 11/08/22  6:38 PM   Specimen: BLOOD  Result Value Ref Range Status   Specimen Description BLOOD RIGHT ANTECUBITAL  Final   Special Requests   Final    BOTTLES DRAWN AEROBIC AND ANAEROBIC Blood Culture adequate volume   Culture  Setup Time   Final    GRAM NEGATIVE RODS IN BOTH AEROBIC AND ANAEROBIC BOTTLES CRITICAL VALUE NOTED.  VALUE IS CONSISTENT WITH PREVIOUSLY REPORTED AND CALLED VALUE.    Culture (A)  Final    ESCHERICHIA COLI SUSCEPTIBILITIES PERFORMED ON PREVIOUS CULTURE WITHIN THE LAST 5 DAYS. Performed at [facility], [place] [address] [place], [place] [address]    Report Status 11/11/2022 FINAL  Final  Urine Culture (for pregnant, neutropenic or urologic patients or patients with an indwelling urinary catheter)     Status: Abnormal   Collection Time: 11/08/22  8:27 PM   Specimen: Urine, Clean Catch  Result Value Ref  Range Status   Specimen Description URINE, CLEAN CATCH  Final   Special Requests   Final    NONE Performed at [facility], [place]  [address] [place], [place] [address]    Culture (A)  Final    70,000 COLONIES/mL ESCHERICHIA COLI Confirmed Extended Spectrum Beta-Lactamase Producer (ESBL).  In bloodstream infections from ESBL organisms, carbapenems are preferred over piperacillin/tazobactam. They are shown to have a lower risk of mortality.    Report Status 11/10/2022 FINAL  Final   Organism ID, Bacteria ESCHERICHIA COLI (A)  Final      Susceptibility   Escherichia coli - MIC*    AMPICILLIN >=32 RESISTANT Resistant     CEFAZOLIN >=64 RESISTANT Resistant     CEFEPIME >=32 RESISTANT Resistant     CEFTRIAXONE >=64 RESISTANT Resistant     CIPROFLOXACIN >=4 RESISTANT Resistant     GENTAMICIN <=1 SENSITIVE Sensitive     IMIPENEM <=0.25 SENSITIVE Sensitive     NITROFURANTOIN <=16 SENSITIVE Sensitive     TRIMETH/SULFA >=320 RESISTANT Resistant     AMPICILLIN/SULBACTAM >=32 RESISTANT Resistant     PIP/TAZO 8 SENSITIVE Sensitive     * 70,000 COLONIES/mL ESCHERICHIA COLI  Urine Culture (for pregnant, neutropenic or urologic patients or patients with an indwelling urinary catheter)     Status: Abnormal   Collection Time: 11/08/22 11:30 PM   Specimen: Urine, Clean Catch  Result Value Ref Range Status   Specimen Description URINE, CLEAN CATCH  Final   Special Requests   Final    NONE Performed at [facility], [address] [place], [place] [address]    Culture (A)  Final    50,000 COLONIES/mL ESCHERICHIA COLI Confirmed Extended Spectrum Beta-Lactamase Producer (ESBL).  In bloodstream infections from ESBL organisms, carbapenems are preferred over piperacillin/tazobactam. They are shown to have a lower risk of mortality.    Report Status 11/10/2022 FINAL  Final   Organism ID, Bacteria ESCHERICHIA COLI (A)  Final      Susceptibility   Escherichia coli - MIC*    AMPICILLIN >=32  RESISTANT Resistant     CEFAZOLIN >=64 RESISTANT Resistant     CEFEPIME >=32 RESISTANT Resistant     CEFTRIAXONE >=64 RESISTANT Resistant     CIPROFLOXACIN >=4 RESISTANT Resistant     GENTAMICIN <=1 SENSITIVE Sensitive     IMIPENEM <=0.25 SENSITIVE Sensitive     NITROFURANTOIN <=16 SENSITIVE Sensitive     TRIMETH/SULFA >=320 RESISTANT Resistant     AMPICILLIN/SULBACTAM >=32 RESISTANT Resistant     PIP/TAZO 8 SENSITIVE Sensitive     * 50,000 COLONIES/mL ESCHERICHIA COLI  MRSA Next Gen by PCR, Nasal     Status: None   Collection Time: 11/08/22 11:30 PM   Specimen: Nasal Mucosa; Nasal Swab  Result Value Ref Range Status   MRSA by PCR Next Gen NOT DETECTED NOT DETECTED Final    Comment: (NOTE) The GeneXpert MRSA Assay (FDA approved for NASAL specimens only), is one component of a comprehensive MRSA colonization surveillance program. It is not intended to diagnose MRSA infection nor to guide or monitor treatment for MRSA infections. Test performance is not FDA approved in patients less than 45 years old. Performed at [facility], [place] [address] [place], [place] [address]     Radiology Studies: DG FLUORO GUIDED NEEDLE [facility] ASPIRATION/INJECTION LOC  Result Date: 11/10/2022 CLINICAL DATA:  Left elbow pain since EMG testing last month, effusion on plain film, on Humira, admitted with UTI bacteremia EXAM: LEFT ELBOW ASPIRATION UNDER FLUOROSCOPY FLUOROSCOPY: 0.70 mGy TECHNIQUE: The procedure, risks (including but not limited to bleeding, infection, structural damage, pain, inability to access the joint), benefits, and alternatives were  explained to the patient. Questions regarding the procedure were encouraged and answered. The patient understands and consents to the procedure. An appropriate skin entry site was determined under fluoroscopy. Site was marked, prepped and draped in usual sterile fashion, and infiltrated locally with 1% lidocaine. A 20 gauge needle was advanced into the joint.  No synovial fluid was acquired. A small amount of contrast was injected to demonstrate intra-articular placement.A fluoroscopic image was saved for documentation.The needle was removed and site bandaged. The patient tolerated procedure well. COMPLICATIONS: None immediate IMPRESSION: Fluoro guided aspiration of left elbow without synovial fluid obtained via 20 gauge needle despite confirmed intra-articular needle placement. Given evidence of effusion on plain film, should suspicion remain for septic joint, may consider repeat aspiration under [name] to target collection directly. This would need to be arranged with a musculoskeletal radiologist on their scheduled day in the facility. Findings communicated with ordering provider after procedure performed via [place]. Read and performed by: Alexandria Lodge, PA-C Electronically Signed   By: Van Clines M.D.   On: 11/10/2022 15:16    Scheduled Meds:  cycloSPORINE  1 drop Both Eyes BID   docusate sodium  100 mg Oral Daily   DULoxetine  30 mg Oral Daily   enoxaparin (LOVENOX) injection  40 mg Subcutaneous Q24H   fluticasone  1 spray Each Nare Daily   gabapentin  300 mg Oral BID   mometasone-formoterol  2 puff Inhalation BID   oxyCODONE  20 mg Oral Q12H   pantoprazole  40 mg Oral Daily   psyllium  1 packet Oral Daily   senna-docusate  1 tablet Oral Q M,W,F-2000   Continuous Infusions:  meropenem (MERREM) IV 1 g (11/12/22 0636)   vancomycin 750 mg (11/12/22 0510)    LOS: 4 days   Time spent: 27mn  Kaison Mcparland C Kendal Ghazarian, DO [facility]  If 7PM-7AM, please contact night-coverage [email or web address]  11/12/2022, 8:17 AM

## 2022-11-12 NOTE — Plan of Care (Signed)

## 2022-11-12 NOTE — Progress Notes (Signed)
Mobility Specialist Progress Note   11/12/22 1655  Mobility  Activity Ambulated with assistance in hallway  Level of Assistance Minimal assist, patient does 75% or more  Assistive Device Front wheel walker  Distance Ambulated (ft) 160 ft  Activity Response Tolerated well  Mobility Referral Yes  $Mobility charge 1 Mobility   Pre Mobility: 92 HR, 150/93 BP, 92% SpO2 on RA During Mobility: 149 HR, 88% SpO2 on RA  Post Mobility: 108 HR, 92% SpO2 on RA  Pt received in chair agreeable to mobility. Upon standing, pt had episode of dizziness secondary to LOB but recovered w/ minA. "Waddle like" gait when ambulating in hallway but steady w/ CGA. X1 seated rest break d/t fatigue in LE's accompanied w/ SOB, recovered w/ PLB after ~17mns. Returned back to room w/o fault, left in chair and placed call bell in reach.    JHolland FallingMobility Specialist Please contact via SecureChat or  Rehab office at [phone]

## 2022-11-13 DIAGNOSIS — Z1612 Extended spectrum beta lactamase (ESBL) resistance: Secondary | ICD-10-CM | POA: Diagnosis not present

## 2022-11-13 DIAGNOSIS — R7881 Bacteremia: Secondary | ICD-10-CM | POA: Diagnosis not present

## 2022-11-13 DIAGNOSIS — N10 Acute tubulo-interstitial nephritis: Secondary | ICD-10-CM | POA: Diagnosis not present

## 2022-11-13 LAB — BASIC METABOLIC PANEL WITH GFR
Anion gap: 11 (ref 5–15)
BUN: 7 mg/dL — ABNORMAL LOW (ref 8–23)
CO2: 26 mmol/L (ref 22–32)
Calcium: 8.2 mg/dL — ABNORMAL LOW (ref 8.9–10.3)
Chloride: 101 mmol/L (ref 98–111)
Creatinine, Ser: 0.55 mg/dL (ref 0.44–1.00)
GFR, Estimated: >60 mL/min
Glucose, Bld: 107 mg/dL — ABNORMAL HIGH (ref 70–99)
Potassium: 3.9 mmol/L (ref 3.5–5.1)
Sodium: 138 mmol/L (ref 135–145)

## 2022-11-13 LAB — CBC
HCT: 34.4 % — ABNORMAL LOW (ref 36.0–46.0)
Hemoglobin: 10.8 g/dL — ABNORMAL LOW (ref 12.0–15.0)
MCH: 30.5 pg (ref 26.0–34.0)
MCHC: 31.4 g/dL (ref 30.0–36.0)
MCV: 97.2 fL (ref 80.0–100.0)
Platelets: 266 10*3/uL (ref 150–400)
RBC: 3.54 MIL/uL — ABNORMAL LOW (ref 3.87–5.11)
RDW: 16 % — ABNORMAL HIGH (ref 11.5–15.5)
WBC: 4.9 10*3/uL (ref 4.0–10.5)
nRBC: 0 % (ref 0.0–0.2)

## 2022-11-13 NOTE — Progress Notes (Signed)
Mobility Specialist Progress Note   11/13/22 1540  Mobility  Activity Ambulated with assistance in hallway  Level of Assistance Contact guard assist, steadying assist  Assistive Device Front wheel walker  Distance Ambulated (ft) 80 ft  Activity Response Tolerated well  Mobility Referral Yes  $Mobility charge 1 Mobility   Received pt in be having no complaints and agreeable. No physical assist needed during ambulation but present signs of DOE and requiring x1 standing rest break to recover. Returned back to bed w/o fault, call bell in reach and chair alarm on.   Holland Falling Mobility Specialist Please contact via SecureChat or  Rehab office at [phone]

## 2022-11-13 NOTE — Progress Notes (Addendum)
Physical Therapy Treatment Patient Details Name: [name] MRN: [id number] DOB: [birth year] Today's Date: 11/13/2022   History of Present Illness 72 y.o. female admitted 2/7 for evaluation and treatment of sepsis secondary to suspected UTI. PMHx: rheumatoid arthritis, GERD, asthma, diverticulosis, obesity, sarcoidosis not on treatment.    PT Comments    Struggling a bit with ambulation today. Able to tolerate 80 feet at a very slow pace with min guard assist for safety. No overt buckling. HR elevated to 144 returning to room. Tolerated LE exercises well prior to gait training. Patient will continue to benefit from skilled physical therapy services to further improve independence with functional mobility.    Recommendations for follow up therapy are one component of a multi-disciplinary discharge planning process, led by the attending physician.  Recommendations may be updated based on patient status, additional functional criteria and insurance authorization.  Follow Up Recommendations  Home health PT (At ALF - Wishes to return to [facility])     Assistance Recommended at Discharge Intermittent Supervision/Assistance  Patient can return home with the following A little help with walking and/or transfers;A little help with bathing/dressing/bathroom;Assistance with cooking/housework;Assist for transportation;Help with stairs or ramp for entrance   Equipment Recommendations  None recommended by PT    Recommendations for Other Services       Precautions / Restrictions Precautions Precautions: Fall Restrictions Weight Bearing Restrictions: No     Mobility  Bed Mobility               General bed mobility comments: in recliner    Transfers Overall transfer level: Needs assistance Equipment used: Rollator (4 wheels) Transfers: Sit to/from Stand Sit to Stand: Min guard           General transfer comment: Min guard for safety. Cues for RW use to lean  forward for anterior weight shift due to posterior lean. Requires extra time, rocks to rise for momentum.    Ambulation/Gait Ambulation/Gait assistance: Min guard Gait Distance (Feet): 85 Feet Assistive device: Rolling walker (2 wheels) Gait Pattern/deviations: Step-through pattern, Drifts right/left Gait velocity: slow Gait velocity interpretation: <1.31 ft/sec, indicative of household ambulator   General Gait Details: Considerable amount of time to ambualate a shorter distance today. Min guard for safety. Cues for sequencing with RW for larger step into RW for support. No overt LOB this date. HR up to 144 bpm on return to room. RN aware.   Stairs             Wheelchair Mobility    Modified Rankin (Stroke Patients Only)       Balance Overall balance assessment: Needs assistance Sitting-balance support: No upper extremity supported, Feet supported Sitting balance-Leahy Scale: Good     Standing balance support: During functional activity, Reliant on assistive device for balance, Single extremity supported Standing balance-Leahy Scale: Poor                              Cognition Arousal/Alertness: Awake/alert Behavior During Therapy: WFL for tasks assessed/performed Overall Cognitive Status: Within Functional Limits for tasks assessed                                          Exercises General Exercises - Lower Extremity Ankle Circles/Pumps: Strengthening, Both, 10 reps, Supine Quad Sets: Strengthening, Both, 10 reps, Supine Gluteal Sets: Strengthening,  Both, 10 reps, Supine Heel Slides: Strengthening, Both, 10 reps, Seated Hip ABduction/ADduction: Strengthening, Both, 10 reps, Seated    General Comments General comments (skin integrity, edema, etc.): HR 144 at end of ambulatory bout. very fatigued today after ambulating.      Pertinent Vitals/Pain Pain Assessment Pain Assessment: No/denies pain    Home Living                           Prior Function            PT Goals (current goals can now be found in the care plan section) Acute Rehab PT Goals Patient Stated Goal: Go back to ALF PT Goal Formulation: With patient Time For Goal Achievement: 11/17/22 Potential to Achieve Goals: Good Progress towards PT goals: Progressing toward goals    Frequency    Min 3X/week      PT Plan Current plan remains appropriate    Co-evaluation              AM-PAC PT "6 Clicks" Mobility   Outcome Measure  Help needed turning from your back to your side while in a flat bed without using bedrails?: None Help needed moving from lying on your back to sitting on the side of a flat bed without using bedrails?: None Help needed moving to and from a bed to a chair (including a wheelchair)?: A Little Help needed standing up from a chair using your arms (e.g., wheelchair or bedside chair)?: A Little Help needed to walk in hospital room?: A Little Help needed climbing 3-5 steps with a railing? : A Lot 6 Click Score: 19    End of Session Equipment Utilized During Treatment: Gait belt Activity Tolerance: Patient tolerated treatment well Patient left: in chair;with call bell/phone within reach;with chair alarm set Nurse Communication: Mobility status PT Visit Diagnosis: Unsteadiness on feet (R26.81);Other abnormalities of gait and mobility (R26.89);Muscle weakness (generalized) (M62.81);Difficulty in walking, not elsewhere classified (R26.2)     Time: [id number] PT Time Calculation (min) (ACUTE ONLY): 33 min  Charges:  $Gait Training: 8-22 mins $Therapeutic Exercise: 8-22 mins                     [name], PT, DPT Physical Therapist Acute Rehabilitation Services [place]    [name] 11/13/2022, 11:48 AM

## 2022-11-13 NOTE — Progress Notes (Signed)
PROGRESS NOTE    [name]  [id number] DOB: 1951-09-08 DOA: 11/08/2022 PCP: [name], MD   Brief Narrative:  [name] is a 72 y.o. female with medical history significant of rheumatoid arthritis, GERD, asthma, diverticulosis, obesity, sarcoidosis not on treatment, here with fevers and weakness. Admitted for evaluation and treatment of sepsis secondary to suspected UTI.  Assessment & Plan:   Principal Problem:   Bacteremia Active Problems:   Dyspnea   Infection due to ESBL-producing Escherichia coli   Acute pyelonephritis   Sepsis ([place])  Sepsis UTI -secondary to ESBL E. coli Concurrent ESBL Ecoli Bacteremia, POA Concurrent R sided pyelonephritis -Patient present with fever, tachycardia, tachypnea and weakness.  -UA is consistent with UTI; urine culture 70,000 colonies of ESBL E. coli -Immunosuppressed on Humira -CT abdomen pelvis concerning for pyelonephritis -Blood cultures positive for E. coli, bio fire indicates ESBL strain; cultures confirm -ID sidelined, transition to Invanz for 10 days of therapy total - last day of therapy 11/17/22   Left elbow pain, unspecified, resolved Likely secondary to advanced osteoarthritis Rule out septic arthritis -Reports MRI outpatient with Dr. Marlou Sa early January - this appears to have been a C-spine image however not an elbow. She has plans to return to his office for repeat imaging. -Tender to palpation but without overt findings - on Humira so there is question of septic arthritis -Antibiotics as above, unlikely septic arthritis given obvious source as above although there is disseminated bacteremia increasing patient's risk of secondary infections- follow up clinically -Xray shows mild joint effusion and advanced arthritis - left elbow aspiration unsuccessful, no fluid was able to be obtained -pain appears to be resolving - likely arthritic in nature.  Acute metabolic encephalopathy, resolved  -Patient appears to  be back to baseline, was having difficulty providing history overnight but is now alert oriented x 4, able to provide appropriate history without difficulty  Hx of RA Arthritis -hold methothrexate -s/p Humira dose day of admission per report -Continue oxycodone 12-hour tablet 20 mg every 12 hours, oxycodone 10 mg every 4 hours as needed for severe pain -Gabapentin 300 mg twice daily -Cymbalta 30 mg daily -Bowel regimen Colace 100 mg daily, miralax prn, psyllium, senna-colace MWF   Hx of b/l knee replacement - Stable, anticoagulation ongoing, unclear etiology for chronic anticoagulation - Pharmacy was able to verify patient continues to be on Xarelto per [facility] PCP(who no longer works there) - Her last knee surgery was March 2023 - discussed with orthopedics - there is no indication for chronic anticoagulation from their standpoint - We will DC full dose anticoagulation given no clear indication at this time.   COPD -Not in acute exacerbation -Continue home Dulera   GERD -PPI   Normocytic anemia S/p 2 L fluids.  -partially dilutional, continue monitor   DVT prophylaxis: Lovenox Code Status: Full Family Communication: None present  Status is: Inpatient  Dispo: The patient is from: Facility              Anticipated d/c is to: Same              Anticipated d/c date is: 11/17/22 pending completion of antibiotics              Patient currently not medically stable for discharge given ongoing need for IV antibiotics further evaluation and possible imaging given disseminated infection as above  Consultants:  Infectious disease  Procedures:  None  Antimicrobials:  Invanz - continue for 10 day total course (  merropenem/vancomycin stopped) - last dose 11/17/22.  Subjective: No acute issues or events overnight denies nausea vomiting diarrhea constipation headache fevers chills or chest pain.  Wrist and elbow pain have resolved.  Objective: Vitals:   11/12/22 0953 11/12/22 1600  11/12/22 2012 11/13/22 0521  BP:  (!) 150/93 (!) 158/102 (!) 148/81  Pulse:  92 91 (!) 105  Resp:   18 18  Temp:  98.7 F (37.1 C) 98.9 F (37.2 C) 99 F (37.2 C)  TempSrc:  Oral Oral Oral  SpO2: 97% 92% 93% 91%  Weight:      Height:        Intake/Output Summary (Last 24 hours) at 11/13/2022 0727 Last data filed at 11/13/2022 0600 Gross per 24 hour  Intake 1100 ml  Output 1275 ml  Net -175 ml    Filed Weights   11/08/22 1451 11/08/22 2318  Weight: 81.6 kg 87.2 kg    Examination:  General:  Pleasantly resting in bed, No acute distress. HEENT:  Normocephalic atraumatic.  Sclerae nonicteric, noninjected.  Extraocular movements intact bilaterally. Neck:  Without mass or deformity.  Trachea is midline. Lungs:  Clear to auscultate bilaterally without rhonchi, wheeze, or rales. Heart:  Regular rate and rhythm.  Without murmurs, rubs, or gallops. Abdomen:  Soft, nontender, nondistended.  Without guarding or rebound. Extremities: Without erythema or edema, ROM intact Vascular:  Dorsalis pedis and posterior tibial pulses palpable bilaterally. Skin:  Warm and dry, no erythema  Data Reviewed: I have personally reviewed following labs and imaging studies  CBC: Recent Labs  Lab 11/08/22 1500 11/09/22 0023 11/11/22 0700 11/12/22 1156 11/13/22 0309  WBC 8.7 17.4* 5.9 4.0 4.9  NEUTROABS 7.0  --   --   --   --   HGB 11.8* 10.5* 11.3* 10.6* 10.8*  HCT 38.2 32.4* 36.7 35.0* 34.4*  MCV 97.9 95.9 100.8* 98.0 97.2  PLT 320 280 234 265 [id number]    Basic Metabolic Panel: Recent Labs  Lab 11/08/22 1500 11/09/22 0023 11/11/22 0700 11/12/22 1156 11/13/22 0309  NA 135 134* 137 138 138  K 4.2 3.7 4.6 3.7 3.9  CL 99 101 104 103 101  CO2 23 22 23 26 26  $ GLUCOSE 117* 160* 96 118* 107*  BUN 10 8 8 $ <5* 7*  CREATININE 0.69 0.59 0.62 0.51 0.55  CALCIUM 8.7* 8.0* 8.3* 8.2* 8.2*    GFR: Estimated Creatinine Clearance: 61.5 mL/min (by C-G formula based on SCr of 0.55 mg/dL). Liver  Function Tests: Recent Labs  Lab 11/08/22 1500 11/09/22 0023  AST 37 26  ALT 26 20  ALKPHOS 87 75  BILITOT 0.7 0.7  PROT 7.2 6.0*  ALBUMIN 3.8 2.9*   Coagulation Profile: Recent Labs  Lab 11/08/22 1500  INR 1.5*    Sepsis Labs: Recent Labs  Lab 11/08/22 1500 11/08/22 1838 11/09/22 0023  PROCALCITON  --   --  4.79  LATICACIDVEN 1.6 1.3  --      Recent Results (from the past 240 hour(s))  Culture, blood (Routine x 2)     Status: Abnormal   Collection Time: 11/08/22  3:07 PM   Specimen: BLOOD RIGHT FOREARM  Result Value Ref Range Status   Specimen Description BLOOD RIGHT FOREARM  Final   Special Requests   Final    BOTTLES DRAWN AEROBIC AND ANAEROBIC Blood Culture adequate volume   Culture  Setup Time   Final    GRAM NEGATIVE RODS IN BOTH AEROBIC AND ANAEROBIC BOTTLES CRITICAL RESULT CALLED TO, READ  BACK BY AND VERIFIED WITH: PHARMD G. LEDFORD 11/09/22 @ 0602 BY AB Performed at [facility], [place] [address], [place], [place] [address]    Culture (A)  Final    ESCHERICHIA COLI Confirmed Extended Spectrum Beta-Lactamase Producer (ESBL).  In bloodstream infections from ESBL organisms, carbapenems are preferred over piperacillin/tazobactam. They are shown to have a lower risk of mortality.    Report Status 11/11/2022 FINAL  Final   Organism ID, Bacteria ESCHERICHIA COLI  Final      Susceptibility   Escherichia coli - MIC*    AMPICILLIN >=32 RESISTANT Resistant     CEFAZOLIN >=64 RESISTANT Resistant     CEFEPIME >=32 RESISTANT Resistant     CEFTAZIDIME >=64 RESISTANT Resistant     CEFTRIAXONE >=64 RESISTANT Resistant     CIPROFLOXACIN >=4 RESISTANT Resistant     GENTAMICIN <=1 SENSITIVE Sensitive     IMIPENEM <=0.25 SENSITIVE Sensitive     TRIMETH/SULFA >=320 RESISTANT Resistant     AMPICILLIN/SULBACTAM >=32 RESISTANT Resistant     PIP/TAZO 8 SENSITIVE Sensitive     * ESCHERICHIA COLI  Blood Culture ID Panel (Reflexed)     Status: Abnormal   Collection  Time: 11/08/22  3:07 PM  Result Value Ref Range Status   Enterococcus faecalis NOT DETECTED NOT DETECTED Final   Enterococcus Faecium NOT DETECTED NOT DETECTED Final   Listeria monocytogenes NOT DETECTED NOT DETECTED Final   Staphylococcus species NOT DETECTED NOT DETECTED Final   Staphylococcus aureus (BCID) NOT DETECTED NOT DETECTED Final   Staphylococcus epidermidis NOT DETECTED NOT DETECTED Final   Staphylococcus lugdunensis NOT DETECTED NOT DETECTED Final   Streptococcus species NOT DETECTED NOT DETECTED Final   Streptococcus agalactiae NOT DETECTED NOT DETECTED Final   Streptococcus pneumoniae NOT DETECTED NOT DETECTED Final   Streptococcus pyogenes NOT DETECTED NOT DETECTED Final   A.calcoaceticus-baumannii NOT DETECTED NOT DETECTED Final   Bacteroides fragilis NOT DETECTED NOT DETECTED Final   Enterobacterales DETECTED (A) NOT DETECTED Final    Comment: Enterobacterales represent a large order of gram negative bacteria, not a single organism. CRITICAL RESULT CALLED TO, READ BACK BY AND VERIFIED WITH: PHARMD G. LEDFORD 11/09/22 @ 0602 BY AB    Enterobacter cloacae complex NOT DETECTED NOT DETECTED Final   Escherichia coli DETECTED (A) NOT DETECTED Final    Comment: CRITICAL RESULT CALLED TO, READ BACK BY AND VERIFIED WITH: PHARMD G. LEDFORD 11/09/22 @ 0602 BY AB    Klebsiella aerogenes NOT DETECTED NOT DETECTED Final   Klebsiella oxytoca NOT DETECTED NOT DETECTED Final   Klebsiella pneumoniae NOT DETECTED NOT DETECTED Final   Proteus species NOT DETECTED NOT DETECTED Final   Salmonella species NOT DETECTED NOT DETECTED Final   Serratia marcescens NOT DETECTED NOT DETECTED Final   Haemophilus influenzae NOT DETECTED NOT DETECTED Final   Neisseria meningitidis NOT DETECTED NOT DETECTED Final   Pseudomonas aeruginosa NOT DETECTED NOT DETECTED Final   Stenotrophomonas maltophilia NOT DETECTED NOT DETECTED Final   Candida albicans NOT DETECTED NOT DETECTED Final   Candida auris NOT  DETECTED NOT DETECTED Final   Candida glabrata NOT DETECTED NOT DETECTED Final   Candida krusei NOT DETECTED NOT DETECTED Final   Candida parapsilosis NOT DETECTED NOT DETECTED Final   Candida tropicalis NOT DETECTED NOT DETECTED Final   Cryptococcus neoformans/gattii NOT DETECTED NOT DETECTED Final   CTX-M ESBL DETECTED (A) NOT DETECTED Final    Comment: CRITICAL RESULT CALLED TO, READ BACK BY AND VERIFIED WITH: PHARMD G. LEDFORD  11/09/22 @ 0602 BY AB (NOTE) Extended spectrum beta-lactamase detected. Recommend a carbapenem as initial therapy.      Carbapenem resistance IMP NOT DETECTED NOT DETECTED Final   Carbapenem resistance KPC NOT DETECTED NOT DETECTED Final   Carbapenem resistance NDM NOT DETECTED NOT DETECTED Final   Carbapenem resist OXA 48 LIKE NOT DETECTED NOT DETECTED Final   Carbapenem resistance VIM NOT DETECTED NOT DETECTED Final    Comment: Performed at [facility], [place] [address], [place], [place] [address]  Resp panel by RT-PCR (RSV, Flu A&B, Covid) Anterior Nasal Swab     Status: None   Collection Time: 11/08/22  3:12 PM   Specimen: Anterior Nasal Swab  Result Value Ref Range Status   SARS Coronavirus 2 by RT PCR NEGATIVE NEGATIVE Final   Influenza A by PCR NEGATIVE NEGATIVE Final   Influenza B by PCR NEGATIVE NEGATIVE Final    Comment: (NOTE) The Xpert Xpress SARS-CoV-2/FLU/RSV plus assay is intended as an aid in the diagnosis of influenza from Nasopharyngeal swab specimens and should not be used as a sole basis for treatment. Nasal washings and aspirates are unacceptable for Xpert Xpress SARS-CoV-2/FLU/RSV testing.  Fact Sheet for Patients: EntrepreneurPulse.com.au  Fact Sheet for Healthcare Providers: IncredibleEmployment.be  This test is not yet approved or cleared by the [place] FDA and has been authorized for detection and/or diagnosis of SARS-CoV-2 by FDA under an Emergency Use Authorization (EUA). This  EUA will remain in effect (meaning this test can be used) for the duration of the COVID-19 declaration under Section 564(b)(1) of the Act, 21 U.S.C. section 360bbb-3(b)(1), unless the authorization is terminated or revoked.     Resp Syncytial Virus by PCR NEGATIVE NEGATIVE Final    Comment: (NOTE) Fact Sheet for Patients: EntrepreneurPulse.com.au  Fact Sheet for Healthcare Providers: IncredibleEmployment.be  This test is not yet approved or cleared by the [place] FDA and has been authorized for detection and/or diagnosis of SARS-CoV-2 by FDA under an Emergency Use Authorization (EUA). This EUA will remain in effect (meaning this test can be used) for the duration of the COVID-19 declaration under Section 564(b)(1) of the Act, 21 U.S.C. section 360bbb-3(b)(1), unless the authorization is terminated or revoked.  Performed at [facility], [place] [address], [place], [place] [address]   Culture, blood (Routine x 2)     Status: Abnormal   Collection Time: 11/08/22  6:38 PM   Specimen: BLOOD  Result Value Ref Range Status   Specimen Description BLOOD RIGHT ANTECUBITAL  Final   Special Requests   Final    BOTTLES DRAWN AEROBIC AND ANAEROBIC Blood Culture adequate volume   Culture  Setup Time   Final    GRAM NEGATIVE RODS IN BOTH AEROBIC AND ANAEROBIC BOTTLES CRITICAL VALUE NOTED.  VALUE IS CONSISTENT WITH PREVIOUSLY REPORTED AND CALLED VALUE.    Culture (A)  Final    ESCHERICHIA COLI SUSCEPTIBILITIES PERFORMED ON PREVIOUS CULTURE WITHIN THE LAST 5 DAYS. Performed at [facility], [place] [address], [place], [place] [address]    Report Status 11/11/2022 FINAL  Final  Urine Culture (for pregnant, neutropenic or urologic patients or patients with an indwelling urinary catheter)     Status: Abnormal   Collection Time: 11/08/22  8:27 PM   Specimen: Urine, Clean Catch  Result Value Ref Range Status   Specimen Description URINE, CLEAN  CATCH  Final   Special Requests   Final    NONE Performed at [facility], [address], [place], [place] [address]    Culture (A)  Final    70,000 COLONIES/mL ESCHERICHIA COLI Confirmed Extended Spectrum Beta-Lactamase Producer (ESBL).  In bloodstream infections from ESBL organisms, carbapenems are preferred over piperacillin/tazobactam. They are shown to have a lower risk of mortality.    Report Status 11/10/2022 FINAL  Final   Organism ID, Bacteria ESCHERICHIA COLI (A)  Final      Susceptibility   Escherichia coli - MIC*    AMPICILLIN >=32 RESISTANT Resistant     CEFAZOLIN >=64 RESISTANT Resistant     CEFEPIME >=32 RESISTANT Resistant     CEFTRIAXONE >=64 RESISTANT Resistant     CIPROFLOXACIN >=4 RESISTANT Resistant     GENTAMICIN <=1 SENSITIVE Sensitive     IMIPENEM <=0.25 SENSITIVE Sensitive     NITROFURANTOIN <=16 SENSITIVE Sensitive     TRIMETH/SULFA >=320 RESISTANT Resistant     AMPICILLIN/SULBACTAM >=32 RESISTANT Resistant     PIP/TAZO 8 SENSITIVE Sensitive     * 70,000 COLONIES/mL ESCHERICHIA COLI  Urine Culture (for pregnant, neutropenic or urologic patients or patients with an indwelling urinary catheter)     Status: Abnormal   Collection Time: 11/08/22 11:30 PM   Specimen: Urine, Clean Catch  Result Value Ref Range Status   Specimen Description URINE, CLEAN CATCH  Final   Special Requests   Final    NONE Performed at [facility], [address] [place], [place] [address]    Culture (A)  Final    50,000 COLONIES/mL ESCHERICHIA COLI Confirmed Extended Spectrum Beta-Lactamase Producer (ESBL).  In bloodstream infections from ESBL organisms, carbapenems are preferred over piperacillin/tazobactam. They are shown to have a lower risk of mortality.    Report Status 11/10/2022 FINAL  Final   Organism ID, Bacteria ESCHERICHIA COLI (A)  Final      Susceptibility   Escherichia coli - MIC*    AMPICILLIN >=32 RESISTANT Resistant     CEFAZOLIN >=64 RESISTANT  Resistant     CEFEPIME >=32 RESISTANT Resistant     CEFTRIAXONE >=64 RESISTANT Resistant     CIPROFLOXACIN >=4 RESISTANT Resistant     GENTAMICIN <=1 SENSITIVE Sensitive     IMIPENEM <=0.25 SENSITIVE Sensitive     NITROFURANTOIN <=16 SENSITIVE Sensitive     TRIMETH/SULFA >=320 RESISTANT Resistant     AMPICILLIN/SULBACTAM >=32 RESISTANT Resistant     PIP/TAZO 8 SENSITIVE Sensitive     * 50,000 COLONIES/mL ESCHERICHIA COLI  MRSA Next Gen by PCR, Nasal     Status: None   Collection Time: 11/08/22 11:30 PM   Specimen: Nasal Mucosa; Nasal Swab  Result Value Ref Range Status   MRSA by PCR Next Gen NOT DETECTED NOT DETECTED Final    Comment: (NOTE) The GeneXpert MRSA Assay (FDA approved for NASAL specimens only), is one component of a comprehensive MRSA colonization surveillance program. It is not intended to diagnose MRSA infection nor to guide or monitor treatment for MRSA infections. Test performance is not FDA approved in patients less than 7 years old. Performed at [facility], [place] [address] [place], [place] [address]   Culture, blood (Routine X 2) w Reflex to ID Panel     Status: None (Preliminary result)   Collection Time: 11/11/22  7:11 AM   Specimen: BLOOD LEFT HAND  Result Value Ref Range Status   Specimen Description BLOOD LEFT HAND  Final   Special Requests   Final    BOTTLES DRAWN AEROBIC ONLY Blood Culture adequate  volume   Culture   Final    NO GROWTH 1 DAY Performed at [facility], [place] [address] [place], [place] [address]    Report Status PENDING  Incomplete  Culture, blood (Routine X 2) w Reflex to ID Panel     Status: None (Preliminary result)   Collection Time: 11/11/22  7:20 AM   Specimen: BLOOD RIGHT HAND  Result Value Ref Range Status   Specimen Description BLOOD RIGHT HAND  Final   Special Requests   Final    BOTTLES DRAWN AEROBIC ONLY Blood Culture adequate volume   Culture   Final    NO GROWTH 1 DAY Performed at [facility], [place] [address] [place], [place] [address]    Report Status PENDING  Incomplete    Radiology Studies: No results found.  Scheduled Meds:  cycloSPORINE  1 drop Both Eyes BID   docusate sodium  100 mg Oral Daily   DULoxetine  30 mg Oral Daily   enoxaparin (LOVENOX) injection  40 mg Subcutaneous Q24H   fluticasone  1 spray Each Nare Daily   gabapentin  300 mg Oral BID   mometasone-formoterol  2 puff Inhalation BID   oxyCODONE  20 mg Oral Q12H   pantoprazole  40 mg Oral Daily   psyllium  1 packet Oral Daily   senna-docusate  1 tablet Oral Q M,W,F-2000   Continuous Infusions:  ertapenem Stopped (11/12/22 1327)    LOS: 5 days   Time spent: 35mn  Sabastian Raimondi C Walta [place], DO [facility]  If 7PM-7AM, please contact night-coverage [email or web address]  11/13/2022, 7:27 AM

## 2022-11-14 DIAGNOSIS — N10 Acute tubulo-interstitial nephritis: Secondary | ICD-10-CM | POA: Diagnosis not present

## 2022-11-14 DIAGNOSIS — Z1612 Extended spectrum beta lactamase (ESBL) resistance: Secondary | ICD-10-CM | POA: Diagnosis not present

## 2022-11-14 DIAGNOSIS — R7881 Bacteremia: Secondary | ICD-10-CM | POA: Diagnosis not present

## 2022-11-14 LAB — BASIC METABOLIC PANEL
Anion gap: 7 (ref 5–15)
BUN: 6 mg/dL — ABNORMAL LOW (ref 8–23)
CO2: 30 mmol/L (ref 22–32)
Calcium: 8.1 mg/dL — ABNORMAL LOW (ref 8.9–10.3)
Chloride: 102 mmol/L (ref 98–111)
Creatinine, Ser: 0.51 mg/dL (ref 0.44–1.00)
GFR, Estimated: >60 mL/min (ref >60)
Glucose, Bld: 102 mg/dL — ABNORMAL HIGH (ref 70–99)
Potassium: 3.6 mmol/L (ref 3.5–5.1)
Sodium: 139 mmol/L (ref 135–145)

## 2022-11-14 LAB — CBC
HCT: 32.9 % — ABNORMAL LOW (ref 36.0–46.0)
Hemoglobin: 10.3 g/dL — ABNORMAL LOW (ref 12.0–15.0)
MCH: 30.3 pg (ref 26.0–34.0)
MCHC: 31.3 g/dL (ref 30.0–36.0)
MCV: 96.8 fL (ref 80.0–100.0)
Platelets: 275 10*3/uL (ref 150–400)
RBC: 3.4 MIL/uL — ABNORMAL LOW (ref 3.87–5.11)
RDW: 16.3 % — ABNORMAL HIGH (ref 11.5–15.5)
WBC: 7.2 10*3/uL (ref 4.0–10.5)
nRBC: 0 % (ref 0.0–0.2)

## 2022-11-14 MED ORDER — DIPHENHYDRAMINE HCL 50 MG/ML IJ SOLN
25.0000 mg | Freq: Once | INTRAMUSCULAR | Status: AC
  Administered 2022-11-14: 25 mg via INTRAVENOUS
  Filled 2022-11-14: qty 1

## 2022-11-14 NOTE — Progress Notes (Signed)
PROGRESS NOTE    [name]  [id number] DOB: 06/09/1951 DOA: 11/08/2022 PCP: [name], MD   Brief Narrative:  [name] is a 72 y.o. female with medical history significant of rheumatoid arthritis, GERD, asthma, diverticulosis, obesity, sarcoidosis not on treatment, here with fevers and weakness. Admitted for evaluation and treatment of sepsis secondary to suspected UTI.  Assessment & Plan:   Principal Problem:   Bacteremia Active Problems:   Dyspnea   Infection due to ESBL-producing Escherichia coli   Acute pyelonephritis   Sepsis ([place])  Sepsis UTI -secondary to ESBL E. coli Concurrent ESBL Ecoli Bacteremia, POA Concurrent R sided pyelonephritis -Patient present with fever, tachycardia, tachypnea and weakness.  -UA is consistent with UTI; urine culture 70,000 colonies of ESBL E. coli -Immunosuppressed on Humira -CT abdomen pelvis concerning for pyelonephritis -Blood cultures positive for E. coli, bio fire indicates ESBL strain; cultures confirm -ID sidelined, transition to Invanz for 10 days of therapy total - last day of therapy 11/17/22   Left elbow pain, unspecified, resolved Likely secondary to advanced osteoarthritis Rule out septic arthritis -Reports MRI outpatient with Dr. Marlou Sa early January - this appears to have been a C-spine image however not an elbow. She has plans to return to his office for repeat imaging. -Tender to palpation but without overt findings - on Humira so there is question of septic arthritis -Antibiotics as above, unlikely septic arthritis given obvious source as above although there is disseminated bacteremia increasing patient's risk of secondary infections- follow up clinically -Xray shows mild joint effusion and advanced arthritis - left elbow aspiration unsuccessful, no fluid was able to be obtained -pain appears to be resolving - likely arthritic in nature.  Acute metabolic encephalopathy, resolved  -Patient appears to  be back to baseline, was having difficulty providing history overnight but is now alert oriented x 4, able to provide appropriate history without difficulty  Hx of RA Arthritis -hold methothrexate -s/p Humira dose day of admission per report -Continue oxycodone 12-hour tablet 20 mg every 12 hours, oxycodone 10 mg every 4 hours as needed for severe pain -Gabapentin 300 mg twice daily -Cymbalta 30 mg daily -Bowel regimen Colace 100 mg daily, miralax prn, psyllium, senna-colace MWF   Hx of b/l knee replacement - Stable, anticoagulation ongoing, unclear etiology for chronic anticoagulation - Pharmacy was able to verify patient continues to be on Xarelto per [facility] PCP(who no longer works there) - Her last knee surgery was March 2023 - discussed with orthopedics - there is no indication for chronic anticoagulation from their standpoint - We will DC full dose anticoagulation given no clear indication at this time.   COPD -Not in acute exacerbation -Continue home Dulera   GERD -PPI   Normocytic anemia S/p 2 L fluids.  -partially dilutional, continue monitor   DVT prophylaxis: Lovenox Code Status: Full Family Communication: None present  Status is: Inpatient  Dispo: The patient is from: Facility              Anticipated d/c is to: Same with home health/PT              Anticipated d/c date is: 11/17/22 pending completion of antibiotics              Patient currently not medically stable for discharge given ongoing need for IV antibiotics further evaluation and possible imaging given disseminated infection as above  Consultants:  Infectious disease  Procedures:  None  Antimicrobials:  Invanz - continue for 10  day total course (merropenem/vancomycin stopped) - last dose 11/17/22.  Subjective: No acute issues or events overnight denies nausea vomiting diarrhea constipation headache fevers chills or chest pain.  Wrist and elbow pain have resolved.  Objective: Vitals:   11/13/22  2011 11/13/22 2109 11/14/22 0300 11/14/22 0557  BP:  (!) 146/83 (!) 142/75 (!) 153/84  Pulse: 92 94 94 85  Resp: 16 18 17 14  $ Temp:  98.4 F (36.9 C) 98.1 F (36.7 C) 98.3 F (36.8 C)  TempSrc:  Oral Oral Oral  SpO2: 94% 98% 98% 93%  Weight:      Height:        Intake/Output Summary (Last 24 hours) at 11/14/2022 0755 Last data filed at 11/14/2022 0558 Gross per 24 hour  Intake 660 ml  Output 400 ml  Net 260 ml    Filed Weights   11/08/22 1451 11/08/22 2318  Weight: 81.6 kg 87.2 kg    Examination:  General:  Pleasantly resting in bed, No acute distress. HEENT:  Normocephalic atraumatic.  Sclerae nonicteric, noninjected.  Extraocular movements intact bilaterally. Neck:  Without mass or deformity.  Trachea is midline. Lungs:  Clear to auscultate bilaterally without rhonchi, wheeze, or rales. Heart:  Regular rate and rhythm.  Without murmurs, rubs, or gallops. Abdomen:  Soft, nontender, nondistended.  Without guarding or rebound. Extremities: Without erythema or edema, ROM intact Vascular:  Dorsalis pedis and posterior tibial pulses palpable bilaterally. Skin:  Warm and dry, no erythema  Data Reviewed: I have personally reviewed following labs and imaging studies  CBC: Recent Labs  Lab 11/08/22 1500 11/09/22 0023 11/11/22 0700 11/12/22 1156 11/13/22 0309 11/14/22 0228  WBC 8.7 17.4* 5.9 4.0 4.9 7.2  NEUTROABS 7.0  --   --   --   --   --   HGB 11.8* 10.5* 11.3* 10.6* 10.8* 10.3*  HCT 38.2 32.4* 36.7 35.0* 34.4* 32.9*  MCV 97.9 95.9 100.8* 98.0 97.2 96.8  PLT 320 280 234 265 266 [id number]    Basic Metabolic Panel: Recent Labs  Lab 11/09/22 0023 11/11/22 0700 11/12/22 1156 11/13/22 0309 11/14/22 0228  NA 134* 137 138 138 139  K 3.7 4.6 3.7 3.9 3.6  CL 101 104 103 101 102  CO2 22 23 26 26 30  $ GLUCOSE 160* 96 118* 107* 102*  BUN 8 8 <5* 7* 6*  CREATININE 0.59 0.62 0.51 0.55 0.51  CALCIUM 8.0* 8.3* 8.2* 8.2* 8.1*    GFR: Estimated Creatinine Clearance: 61.5  mL/min (by C-G formula based on SCr of 0.51 mg/dL). Liver Function Tests: Recent Labs  Lab 11/08/22 1500 11/09/22 0023  AST 37 26  ALT 26 20  ALKPHOS 87 75  BILITOT 0.7 0.7  PROT 7.2 6.0*  ALBUMIN 3.8 2.9*   Coagulation Profile: Recent Labs  Lab 11/08/22 1500  INR 1.5*    Sepsis Labs: Recent Labs  Lab 11/08/22 1500 11/08/22 1838 11/09/22 0023  PROCALCITON  --   --  4.79  LATICACIDVEN 1.6 1.3  --      Recent Results (from the past 240 hour(s))  Culture, blood (Routine x 2)     Status: Abnormal   Collection Time: 11/08/22  3:07 PM   Specimen: BLOOD RIGHT FOREARM  Result Value Ref Range Status   Specimen Description BLOOD RIGHT FOREARM  Final   Special Requests   Final    BOTTLES DRAWN AEROBIC AND ANAEROBIC Blood Culture adequate volume   Culture  Setup Time   Final    GRAM NEGATIVE  RODS IN BOTH AEROBIC AND ANAEROBIC BOTTLES CRITICAL RESULT CALLED TO, READ BACK BY AND VERIFIED WITH: PHARMD G. LEDFORD 11/09/22 @ 0602 BY AB Performed at [facility], [place] [address] [place], [place] [address]    Culture (A)  Final    ESCHERICHIA COLI Confirmed Extended Spectrum Beta-Lactamase Producer (ESBL).  In bloodstream infections from ESBL organisms, carbapenems are preferred over piperacillin/tazobactam. They are shown to have a lower risk of mortality.    Report Status 11/11/2022 FINAL  Final   Organism ID, Bacteria ESCHERICHIA COLI  Final      Susceptibility   Escherichia coli - MIC*    AMPICILLIN >=32 RESISTANT Resistant     CEFAZOLIN >=64 RESISTANT Resistant     CEFEPIME >=32 RESISTANT Resistant     CEFTAZIDIME >=64 RESISTANT Resistant     CEFTRIAXONE >=64 RESISTANT Resistant     CIPROFLOXACIN >=4 RESISTANT Resistant     GENTAMICIN <=1 SENSITIVE Sensitive     IMIPENEM <=0.25 SENSITIVE Sensitive     TRIMETH/SULFA >=320 RESISTANT Resistant     AMPICILLIN/SULBACTAM >=32 RESISTANT Resistant     PIP/TAZO 8 SENSITIVE Sensitive     * ESCHERICHIA COLI  Blood Culture  ID Panel (Reflexed)     Status: Abnormal   Collection Time: 11/08/22  3:07 PM  Result Value Ref Range Status   Enterococcus faecalis NOT DETECTED NOT DETECTED Final   Enterococcus Faecium NOT DETECTED NOT DETECTED Final   Listeria monocytogenes NOT DETECTED NOT DETECTED Final   Staphylococcus species NOT DETECTED NOT DETECTED Final   Staphylococcus aureus (BCID) NOT DETECTED NOT DETECTED Final   Staphylococcus epidermidis NOT DETECTED NOT DETECTED Final   Staphylococcus lugdunensis NOT DETECTED NOT DETECTED Final   Streptococcus species NOT DETECTED NOT DETECTED Final   Streptococcus agalactiae NOT DETECTED NOT DETECTED Final   Streptococcus pneumoniae NOT DETECTED NOT DETECTED Final   Streptococcus pyogenes NOT DETECTED NOT DETECTED Final   A.calcoaceticus-baumannii NOT DETECTED NOT DETECTED Final   Bacteroides fragilis NOT DETECTED NOT DETECTED Final   Enterobacterales DETECTED (A) NOT DETECTED Final    Comment: Enterobacterales represent a large order of gram negative bacteria, not a single organism. CRITICAL RESULT CALLED TO, READ BACK BY AND VERIFIED WITH: PHARMD G. LEDFORD 11/09/22 @ 0602 BY AB    Enterobacter cloacae complex NOT DETECTED NOT DETECTED Final   Escherichia coli DETECTED (A) NOT DETECTED Final    Comment: CRITICAL RESULT CALLED TO, READ BACK BY AND VERIFIED WITH: PHARMD G. LEDFORD 11/09/22 @ 0602 BY AB    Klebsiella aerogenes NOT DETECTED NOT DETECTED Final   Klebsiella oxytoca NOT DETECTED NOT DETECTED Final   Klebsiella pneumoniae NOT DETECTED NOT DETECTED Final   Proteus species NOT DETECTED NOT DETECTED Final   Salmonella species NOT DETECTED NOT DETECTED Final   Serratia marcescens NOT DETECTED NOT DETECTED Final   Haemophilus influenzae NOT DETECTED NOT DETECTED Final   Neisseria meningitidis NOT DETECTED NOT DETECTED Final   Pseudomonas aeruginosa NOT DETECTED NOT DETECTED Final   Stenotrophomonas maltophilia NOT DETECTED NOT DETECTED Final   Candida  albicans NOT DETECTED NOT DETECTED Final   Candida auris NOT DETECTED NOT DETECTED Final   Candida glabrata NOT DETECTED NOT DETECTED Final   Candida krusei NOT DETECTED NOT DETECTED Final   Candida parapsilosis NOT DETECTED NOT DETECTED Final   Candida tropicalis NOT DETECTED NOT DETECTED Final   Cryptococcus neoformans/gattii NOT DETECTED NOT DETECTED Final   CTX-M ESBL DETECTED (A) NOT DETECTED Final    Comment: CRITICAL  RESULT CALLED TO, READ BACK BY AND VERIFIED WITH: PHARMD G. LEDFORD 11/09/22 @ 0602 BY AB (NOTE) Extended spectrum beta-lactamase detected. Recommend a carbapenem as initial therapy.      Carbapenem resistance IMP NOT DETECTED NOT DETECTED Final   Carbapenem resistance KPC NOT DETECTED NOT DETECTED Final   Carbapenem resistance NDM NOT DETECTED NOT DETECTED Final   Carbapenem resist OXA 48 LIKE NOT DETECTED NOT DETECTED Final   Carbapenem resistance VIM NOT DETECTED NOT DETECTED Final    Comment: Performed at [facility], [place] [address] [place], [place] [address]  Resp panel by RT-PCR (RSV, Flu A&B, Covid) Anterior Nasal Swab     Status: None   Collection Time: 11/08/22  3:12 PM   Specimen: Anterior Nasal Swab  Result Value Ref Range Status   SARS Coronavirus 2 by RT PCR NEGATIVE NEGATIVE Final   Influenza A by PCR NEGATIVE NEGATIVE Final   Influenza B by PCR NEGATIVE NEGATIVE Final    Comment: (NOTE) The Xpert Xpress SARS-CoV-2/FLU/RSV plus assay is intended as an aid in the diagnosis of influenza from Nasopharyngeal swab specimens and should not be used as a sole basis for treatment. Nasal washings and aspirates are unacceptable for Xpert Xpress SARS-CoV-2/FLU/RSV testing.  Fact Sheet for Patients: EntrepreneurPulse.com.au  Fact Sheet for Healthcare Providers: IncredibleEmployment.be  This test is not yet approved or cleared by the [place] FDA and has been authorized for detection and/or diagnosis of  SARS-CoV-2 by FDA under an Emergency Use Authorization (EUA). This EUA will remain in effect (meaning this test can be used) for the duration of the COVID-19 declaration under Section 564(b)(1) of the Act, 21 U.S.C. section 360bbb-3(b)(1), unless the authorization is terminated or revoked.     Resp Syncytial Virus by PCR NEGATIVE NEGATIVE Final    Comment: (NOTE) Fact Sheet for Patients: EntrepreneurPulse.com.au  Fact Sheet for Healthcare Providers: IncredibleEmployment.be  This test is not yet approved or cleared by the [place] FDA and has been authorized for detection and/or diagnosis of SARS-CoV-2 by FDA under an Emergency Use Authorization (EUA). This EUA will remain in effect (meaning this test can be used) for the duration of the COVID-19 declaration under Section 564(b)(1) of the Act, 21 U.S.C. section 360bbb-3(b)(1), unless the authorization is terminated or revoked.  Performed at [facility], [place] [address] [place], [place] [address]   Culture, blood (Routine x 2)     Status: Abnormal   Collection Time: 11/08/22  6:38 PM   Specimen: BLOOD  Result Value Ref Range Status   Specimen Description BLOOD RIGHT ANTECUBITAL  Final   Special Requests   Final    BOTTLES DRAWN AEROBIC AND ANAEROBIC Blood Culture adequate volume   Culture  Setup Time   Final    GRAM NEGATIVE RODS IN BOTH AEROBIC AND ANAEROBIC BOTTLES CRITICAL VALUE NOTED.  VALUE IS CONSISTENT WITH PREVIOUSLY REPORTED AND CALLED VALUE.    Culture (A)  Final    ESCHERICHIA COLI SUSCEPTIBILITIES PERFORMED ON PREVIOUS CULTURE WITHIN THE LAST 5 DAYS. Performed at [facility], [place] [address], [place], [place] [address]    Report Status 11/11/2022 FINAL  Final  Urine Culture (for pregnant, neutropenic or urologic patients or patients with an indwelling urinary catheter)     Status: Abnormal   Collection Time: 11/08/22  8:27 PM   Specimen: Urine, Clean Catch   Result Value Ref Range Status   Specimen Description URINE, CLEAN CATCH  Final   Special Requests  Final    NONE Performed at [facility], [place] [address], [place], [place] [address]    Culture (A)  Final    70,000 COLONIES/mL ESCHERICHIA COLI Confirmed Extended Spectrum Beta-Lactamase Producer (ESBL).  In bloodstream infections from ESBL organisms, carbapenems are preferred over piperacillin/tazobactam. They are shown to have a lower risk of mortality.    Report Status 11/10/2022 FINAL  Final   Organism ID, Bacteria ESCHERICHIA COLI (A)  Final      Susceptibility   Escherichia coli - MIC*    AMPICILLIN >=32 RESISTANT Resistant     CEFAZOLIN >=64 RESISTANT Resistant     CEFEPIME >=32 RESISTANT Resistant     CEFTRIAXONE >=64 RESISTANT Resistant     CIPROFLOXACIN >=4 RESISTANT Resistant     GENTAMICIN <=1 SENSITIVE Sensitive     IMIPENEM <=0.25 SENSITIVE Sensitive     NITROFURANTOIN <=16 SENSITIVE Sensitive     TRIMETH/SULFA >=320 RESISTANT Resistant     AMPICILLIN/SULBACTAM >=32 RESISTANT Resistant     PIP/TAZO 8 SENSITIVE Sensitive     * 70,000 COLONIES/mL ESCHERICHIA COLI  Urine Culture (for pregnant, neutropenic or urologic patients or patients with an indwelling urinary catheter)     Status: Abnormal   Collection Time: 11/08/22 11:30 PM   Specimen: Urine, Clean Catch  Result Value Ref Range Status   Specimen Description URINE, CLEAN CATCH  Final   Special Requests   Final    NONE Performed at [facility], [address], [place], [place] [address]    Culture (A)  Final    50,000 COLONIES/mL ESCHERICHIA COLI Confirmed Extended Spectrum Beta-Lactamase Producer (ESBL).  In bloodstream infections from ESBL organisms, carbapenems are preferred over piperacillin/tazobactam. They are shown to have a lower risk of mortality.    Report Status 11/10/2022 FINAL  Final   Organism ID, Bacteria ESCHERICHIA COLI (A)  Final      Susceptibility   Escherichia coli - MIC*     AMPICILLIN >=32 RESISTANT Resistant     CEFAZOLIN >=64 RESISTANT Resistant     CEFEPIME >=32 RESISTANT Resistant     CEFTRIAXONE >=64 RESISTANT Resistant     CIPROFLOXACIN >=4 RESISTANT Resistant     GENTAMICIN <=1 SENSITIVE Sensitive     IMIPENEM <=0.25 SENSITIVE Sensitive     NITROFURANTOIN <=16 SENSITIVE Sensitive     TRIMETH/SULFA >=320 RESISTANT Resistant     AMPICILLIN/SULBACTAM >=32 RESISTANT Resistant     PIP/TAZO 8 SENSITIVE Sensitive     * 50,000 COLONIES/mL ESCHERICHIA COLI  MRSA Next Gen by PCR, Nasal     Status: None   Collection Time: 11/08/22 11:30 PM   Specimen: Nasal Mucosa; Nasal Swab  Result Value Ref Range Status   MRSA by PCR Next Gen NOT DETECTED NOT DETECTED Final    Comment: (NOTE) The GeneXpert MRSA Assay (FDA approved for NASAL specimens only), is one component of a comprehensive MRSA colonization surveillance program. It is not intended to diagnose MRSA infection nor to guide or monitor treatment for MRSA infections. Test performance is not FDA approved in patients less than 101 years old. Performed at [facility], [place] [address], [place], [place] [address]   Culture, blood (Routine X 2) w Reflex to ID Panel     Status: None (Preliminary result)   Collection Time: 11/11/22  7:11 AM   Specimen: BLOOD LEFT HAND  Result Value Ref Range Status   Specimen Description BLOOD LEFT HAND  Final   Special Requests   Final  BOTTLES DRAWN AEROBIC ONLY Blood Culture adequate volume   Culture   Final    NO GROWTH 2 DAYS Performed at [facility], [place] [address], [place], [place] [address]    Report Status PENDING  Incomplete  Culture, blood (Routine X 2) w Reflex to ID Panel     Status: None (Preliminary result)   Collection Time: 11/11/22  7:20 AM   Specimen: BLOOD RIGHT HAND  Result Value Ref Range Status   Specimen Description BLOOD RIGHT HAND  Final   Special Requests   Final    BOTTLES DRAWN AEROBIC ONLY Blood Culture adequate volume    Culture   Final    NO GROWTH 2 DAYS Performed at [facility], [place] [address] [place],  [address]    Report Status PENDING  Incomplete    Radiology Studies: No results found.  Scheduled Meds:  cycloSPORINE  1 drop Both Eyes BID   docusate sodium  100 mg Oral Daily   DULoxetine  30 mg Oral Daily   enoxaparin (LOVENOX) injection  40 mg Subcutaneous Q24H   fluticasone  1 spray Each Nare Daily   gabapentin  300 mg Oral BID   mometasone-formoterol  2 puff Inhalation BID   oxyCODONE  20 mg Oral Q12H   pantoprazole  40 mg Oral Daily   psyllium  1 packet Oral Daily   senna-docusate  1 tablet Oral Q M,W,F-2000   Continuous Infusions:  ertapenem 1,000 mg (11/13/22 1605)    LOS: 6 days   Time spent: 15mn  Marius Betts C Yandel Zeiner, DO [facility]  If 7PM-7AM, please contact night-coverage [email or web address]  11/14/2022, 7:55 AM

## 2022-11-14 NOTE — TOC Progression Note (Signed)
Transition of Care [facility]) - Initial/Assessment Note    Patient Details  Name: [name] MRN: [id number] Date of Birth: 08/15/1951  [facility] of [facility]) CM/SW Contact:    Milinda Antis, LCSWA Phone Number: 11/14/2022, 3:34 PM  Clinical Narrative:                 LCSW contacted Sunday Spillers at [facility]. ([phone]).  The facility reports that the patient can return to the ALF when medically ready as long as the fluid restriction and strict I &O's are resolved.  TOC following.    Expected Discharge Plan:  (from [facility] assisted living vs SNF) Barriers to Discharge: Continued Medical Work up   Patient Goals and CMS Choice            Expected Discharge Plan and Services In-house Referral: Clinical Social Work Discharge Planning Services: CM Consult   Living arrangements for the past 2 months: [place]                                      Prior Living Arrangements/Services Living arrangements for the past 2 months: [place] Lives with:: Self Patient language and need for interpreter reviewed:: Yes        Need for Family Participation in Patient Care: Yes (Comment) Care giver support system in place?: Yes (comment)   Criminal Activity/Legal Involvement Pertinent to Current Situation/Hospitalization: No - Comment as needed  Activities of Daily Living Home Assistive Devices/Equipment: Hearing aid ADL Screening (condition at time of admission) Patient's cognitive ability adequate to safely complete daily activities?: Yes Is the patient deaf or have difficulty hearing?: Yes Does the patient have difficulty seeing, even when wearing glasses/contacts?: No Does the patient have difficulty concentrating, remembering, or making decisions?: No Patient able to express need for assistance with ADLs?: Yes Does the patient have difficulty dressing or bathing?: No Independently performs ADLs?: Yes (appropriate for  developmental age) Does the patient have difficulty walking or climbing stairs?: Yes Weakness of Legs: Both Weakness of Arms/Hands: Both  Permission Sought/Granted                  Emotional Assessment       Orientation: : Oriented to Self, Oriented to Place Alcohol / Substance Use: Illicit Drugs Psych Involvement: No (comment)  Admission diagnosis:  Dyspnea [R06.00] Acute cystitis without hematuria [N30.00] Patient Active Problem List   Diagnosis Date Noted   Infection due to ESBL-producing Escherichia coli 11/09/2022   Acute pyelonephritis 11/09/2022   Bacteremia 11/09/2022   Sepsis ([place]) 11/09/2022   Dyspnea 11/08/2022   DJD (degenerative joint disease) of knee 04/13/2022   S/P TKR (total knee replacement), left 04/13/2022   Arthritis of right knee    S/P total knee arthroplasty, right 12/08/2021   Pressure injury of skin 08/27/2021   Fall 08/23/2021   Difficulty in walking 08/23/2021   Bilateral lower extremity edema 08/23/2021   Hormone replacement therapy 08/23/2021   Osteoporosis 01/28/2021   Osteoarthritis of right wrist 01/28/2021   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   Foot pain 09/13/2020   Knee pain 08/24/2018   Fall in home 07/14/2015   TIBIALIS TENDINITIS 08/25/2009   CHEST PAIN, OTHER, PAIN 10/21/2008   PEDAL EDEMA 07/22/2008   ANKLE PAIN, BILATERAL 04/01/2008   LOW BACK PAIN 04/01/2008   ACUTE BRONCHITIS 09/23/2007   SARCOIDOSIS 09/02/2007   Asthma  09/02/2007   Diverticulosis of colon 07/11/2007   BACK PAIN, CHRONIC 07/11/2007   PCP:  [name], MD Pharmacy:   [id number] [place]Phone: [phone]: [address]Phone: [phone]: [phone]    Social Determinants of Health (SDOH) Social History: SDOH Screenings   Food  Insecurity: No Food Insecurity (11/09/2022)  Housing: Low Risk  (11/09/2022)  Transportation Needs: No Transportation Needs (11/09/2022)  Utilities: Not At Risk (11/09/2022)  Alcohol Screen: Low Risk  (07/20/2022)  Depression (PHQ2-9): Low Risk  (07/20/2022)  Financial Resource Strain: Low Risk  (07/20/2022)  Physical Activity: Sufficiently Active (07/20/2022)  Social Connections: Moderately Integrated (07/20/2022)  Stress: Stress Concern Present (07/20/2022)  Tobacco Use: Low Risk  (11/10/2022)   SDOH Interventions: Housing Interventions: Intervention Not Indicated   Readmission Risk Interventions     No data to display

## 2022-11-14 NOTE — Progress Notes (Signed)
Mobility Specialist Progress Note   11/14/22 1726  Mobility  Activity Transferred from chair to bed  Level of Assistance Contact guard assist, steadying assist  Assistive Device Front wheel walker  Distance Ambulated (ft) 2 ft  Activity Response Tolerated well  Mobility Referral Yes  $Mobility charge 1 Mobility   Pt requesting assistance to get from chair to bed d/t fatigue in chair. Required CGA but increased time d/t stiffness. Pt left in bed w/ call bell in reach and c/o lips feeling slightly swollen. RN notified.  [place] Falling Mobility Specialist Please contact via SecureChat or  Rehab office at [phone]

## 2022-11-14 NOTE — Progress Notes (Signed)
Administrator for pt's ALF at desk requesting to get info on pt's mobility level at this time so that she can assess if pt appropriate to return to ALF.  PT currently in room working with pt and pt gives permission for admin to assess pt's mobility. CM messaged with admin contact info per request of admin. Gustavo Lah [phone] or [phone]

## 2022-11-14 NOTE — Progress Notes (Signed)
RN notified of pt c/o lips swelling. Upon assessment pt c/o swelling lips with numbness. Tongue does not appear swollen and pt denies swelling in throat and able to swallow. RN notified MD, orders received. Upon follow up with pt she is eating dinner w/o difficulty swallowing

## 2022-11-14 NOTE — Progress Notes (Signed)
Physical Therapy Treatment Patient Details Name: [name] MRN: [id number] DOB: [birth year] Today's Date: 11/14/2022   History of Present Illness 72 y.o. female admitted 2/7 for evaluation and treatment of sepsis secondary to suspected UTI. PMHx: rheumatoid arthritis, GERD, asthma, diverticulosis, obesity, sarcoidosis not on treatment.    PT Comments    Improved gait speed and stability today. HR still elevating into the 140s towards end of ambulatory bout with 2/4 dyspnea. Quickly resolves with seated rest break. Tolerated LE exercises well. Further education with transfer training for improved stability. Remains motivated and eager to return to ALF. Patient will continue to benefit from skilled physical therapy services to further improve independence with functional mobility.    Recommendations for follow up therapy are one component of a multi-disciplinary discharge planning process, led by the attending physician.  Recommendations may be updated based on patient status, additional functional criteria and insurance authorization.  Follow Up Recommendations  Home health PT (At ALF - Wishes to return to [place])     Assistance Recommended at Discharge Intermittent Supervision/Assistance  Patient can return home with the following A little help with walking and/or transfers;A little help with bathing/dressing/bathroom;Assistance with cooking/housework;Assist for transportation;Help with stairs or ramp for entrance   Equipment Recommendations  None recommended by PT    Recommendations for Other Services       Precautions / Restrictions Precautions Precautions: Fall Restrictions Weight Bearing Restrictions: No     Mobility  Bed Mobility Overal bed mobility: Modified Independent             General bed mobility comments: No assist required    Transfers Overall transfer level: Needs assistance Equipment used: Rollator (4 wheels) Transfers: Sit  to/from Stand Sit to Stand: Min guard           General transfer comment: Min guard for safety. Still showing some posterior instability when rising and bracing with back of knees on bed. Cues for forward weight shift over RW.    Ambulation/Gait Ambulation/Gait assistance: Supervision Gait Distance (Feet): 115 Feet Assistive device: Rolling walker (2 wheels) Gait Pattern/deviations: Step-through pattern, Drifts right/left, Wide base of support Gait velocity: slow Gait velocity interpretation: <1.31 ft/sec, indicative of household ambulator   General Gait Details: Improved stride length today. No buckling or overt LOB noted. HR up to 141 bpm towards end of distance, gradullly increasing to this point as she fatigues. 2/4 dyspnea. Drifts rightward, cues to leave room for error near walls/furniture but able to self correct.   Stairs             Wheelchair Mobility    Modified Rankin (Stroke Patients Only)       Balance Overall balance assessment: Needs assistance Sitting-balance support: No upper extremity supported, Feet supported Sitting balance-Leahy Scale: Good     Standing balance support: During functional activity, No upper extremity supported Standing balance-Leahy Scale: Fair                              Cognition Arousal/Alertness: Awake/alert Behavior During Therapy: WFL for tasks assessed/performed Overall Cognitive Status: Within Functional Limits for tasks assessed                                          Exercises General Exercises - Lower Extremity Ankle Circles/Pumps: Strengthening, Both, 10 reps, Supine Quad  Sets: Strengthening, Both, 10 reps, Supine Gluteal Sets: Strengthening, Both, 10 reps, Supine Heel Slides: Strengthening, Both, 10 reps, Seated Hip ABduction/ADduction: Strengthening, Both, 10 reps, Seated    General Comments General comments (skin integrity, edema, etc.): HR to 141 end of gait       Pertinent Vitals/Pain Pain Assessment Pain Assessment: No/denies pain    Home Living                          Prior Function            PT Goals (current goals can now be found in the care plan section) Acute Rehab PT Goals Patient Stated Goal: Go back to ALF PT Goal Formulation: With patient Time For Goal Achievement: 11/17/22 Potential to Achieve Goals: Good Progress towards PT goals: Progressing toward goals    Frequency    Min 3X/week      PT Plan Current plan remains appropriate    Co-evaluation              AM-PAC PT "6 Clicks" Mobility   Outcome Measure  Help needed turning from your back to your side while in a flat bed without using bedrails?: None Help needed moving from lying on your back to sitting on the side of a flat bed without using bedrails?: None Help needed moving to and from a bed to a chair (including a wheelchair)?: A Little Help needed standing up from a chair using your arms (e.g., wheelchair or bedside chair)?: A Little Help needed to walk in hospital room?: A Little Help needed climbing 3-5 steps with a railing? : A Lot 6 Click Score: 19    End of Session Equipment Utilized During Treatment: Gait belt Activity Tolerance: Patient tolerated treatment well Patient left: in chair;with call bell/phone within reach;with chair alarm set Nurse Communication: Mobility status PT Visit Diagnosis: Unsteadiness on feet (R26.81);Other abnormalities of gait and mobility (R26.89);Muscle weakness (generalized) (M62.81);Difficulty in walking, not elsewhere classified (R26.2)     Time: [id number] PT Time Calculation (min) (ACUTE ONLY): 19 min  Charges:  $Gait Training: 8-22 mins                     [name], PT, DPT Physical Therapist Acute Rehabilitation Services [place] [facility]    [name] 11/14/2022, 12:08  PM

## 2022-11-15 DIAGNOSIS — R7881 Bacteremia: Secondary | ICD-10-CM | POA: Diagnosis not present

## 2022-11-15 LAB — BASIC METABOLIC PANEL
Anion gap: 9 (ref 5–15)
BUN: 6 mg/dL — ABNORMAL LOW (ref 8–23)
CO2: 28 mmol/L (ref 22–32)
Calcium: 7.8 mg/dL — ABNORMAL LOW (ref 8.9–10.3)
Chloride: 102 mmol/L (ref 98–111)
Creatinine, Ser: 0.55 mg/dL (ref 0.44–1.00)
GFR, Estimated: >60 mL/min (ref >60)
Glucose, Bld: 104 mg/dL — ABNORMAL HIGH (ref 70–99)
Potassium: 3.7 mmol/L (ref 3.5–5.1)
Sodium: 139 mmol/L (ref 135–145)

## 2022-11-15 LAB — CBC
HCT: 32.3 % — ABNORMAL LOW (ref 36.0–46.0)
Hemoglobin: 10.3 g/dL — ABNORMAL LOW (ref 12.0–15.0)
MCH: 30.7 pg (ref 26.0–34.0)
MCHC: 31.9 g/dL (ref 30.0–36.0)
MCV: 96.4 fL (ref 80.0–100.0)
Platelets: 299 10*3/uL (ref 150–400)
RBC: 3.35 MIL/uL — ABNORMAL LOW (ref 3.87–5.11)
RDW: 16.5 % — ABNORMAL HIGH (ref 11.5–15.5)
WBC: 7.2 10*3/uL (ref 4.0–10.5)
nRBC: 0 % (ref 0.0–0.2)

## 2022-11-15 MED ORDER — SODIUM CHLORIDE 0.9 % IV SOLN
1.0000 g | Freq: Three times a day (TID) | INTRAVENOUS | Status: AC
  Administered 2022-11-16 – 2022-11-18 (×8): 1 g via INTRAVENOUS
  Filled 2022-11-15 (×8): qty 20

## 2022-11-15 MED ORDER — ALBUTEROL SULFATE (2.5 MG/3ML) 0.083% IN NEBU
2.5000 mg | INHALATION_SOLUTION | Freq: Four times a day (QID) | RESPIRATORY_TRACT | Status: DC
  Administered 2022-11-15 – 2022-11-18 (×12): 2.5 mg via RESPIRATORY_TRACT
  Filled 2022-11-15 (×12): qty 3

## 2022-11-15 MED ORDER — FAMOTIDINE IN NACL 20-0.9 MG/50ML-% IV SOLN
20.0000 mg | Freq: Two times a day (BID) | INTRAVENOUS | Status: DC
  Administered 2022-11-15 – 2022-11-17 (×6): 20 mg via INTRAVENOUS
  Filled 2022-11-15 (×7): qty 50

## 2022-11-15 MED ORDER — METHYLPREDNISOLONE SODIUM SUCC 125 MG IJ SOLR
60.0000 mg | Freq: Once | INTRAMUSCULAR | Status: AC
  Administered 2022-11-15: 60 mg via INTRAVENOUS
  Filled 2022-11-15: qty 2

## 2022-11-15 MED ORDER — DIPHENHYDRAMINE HCL 25 MG PO CAPS: 25.0000 mg | ORAL_CAPSULE | Freq: Two times a day (BID) | ORAL | Status: DC

## 2022-11-15 MED ORDER — SODIUM CHLORIDE 0.9 % IV SOLN: INTRAVENOUS | Status: DC | PRN

## 2022-11-15 MED ORDER — DM-GUAIFENESIN ER 30-600 MG PO TB12
1.0000 | ORAL_TABLET | Freq: Two times a day (BID) | ORAL | Status: DC
  Administered 2022-11-15 – 2022-11-18 (×7): 1 via ORAL
  Filled 2022-11-15 (×7): qty 1

## 2022-11-15 MED ORDER — METHYLPREDNISOLONE SODIUM SUCC 40 MG IJ SOLR: 40.0000 mg | Freq: Once | INTRAMUSCULAR | Status: DC

## 2022-11-15 MED ORDER — DIPHENHYDRAMINE HCL 50 MG/ML IJ SOLN
25.0000 mg | Freq: Three times a day (TID) | INTRAMUSCULAR | Status: AC
  Administered 2022-11-15 (×3): 25 mg via INTRAVENOUS
  Filled 2022-11-15 (×3): qty 1

## 2022-11-15 NOTE — Plan of Care (Signed)
?  Problem: Nutrition: ?Goal: Adequate nutrition will be maintained ?Outcome: Progressing ?  ?Problem: Elimination: ?Goal: Will not experience complications related to bowel motility ?Outcome: Progressing ?Goal: Will not experience complications related to urinary retention ?Outcome: Progressing ?  ?Problem: Pain Managment: ?Goal: General experience of comfort will improve ?Outcome: Progressing ?  ?  ?

## 2022-11-15 NOTE — Progress Notes (Addendum)
Pharmacy Antibiotic Note  [name] is a 72 y.o. female admitted on 11/08/2022 with ESBL Ecoli bacteremia/UTI initially treated with Merrem but switched to Ertapenem to complete 10 days. On day 3 of ertapenem, patient developed mouth swelling/numbness without throat involvement. Allergic history and medications reviewed and with ID guidance, decision was made to switch patient back to [facility] for the remaining 3 days of therapy. Pharmacy has been consulted for dosing assistance. Repeat cultures have been negative to date, renal function is stable.  Plan: Merrem 1gm IV q 8h x 3 more days Monitor for cross-sensitivity  Height: 5' (152.4 cm) Weight: 87.2 kg (192 lb 3.9 oz) IBW/kg (Calculated) : 45.5  Temp (24hrs), Avg:98.2 F (36.8 C), Min:97.8 F (36.6 C), Max:98.6 F (37 C)  Recent Labs  Lab 11/08/22 1500 11/08/22 1838 11/09/22 0023 11/11/22 0700 11/12/22 1156 11/13/22 0309 11/14/22 0228 11/15/22 0045  WBC 8.7  --    < > 5.9 4.0 4.9 7.2 7.2  CREATININE 0.69  --    < > 0.62 0.51 0.55 0.51 0.55  LATICACIDVEN 1.6 1.3  --   --   --   --   --   --    < > = values in this interval not displayed.    Estimated Creatinine Clearance: 61.5 mL/min (by C-G formula based on SCr of 0.55 mg/dL).    Allergies  Allergen Reactions   Ibuprofen Rash   Sulfa Antibiotics Rash    Antimicrobials this admission: CTX x1 2/7 Vanc 2/7 >> 2/11 Cefepime 2/8 x1 Meropenem 2/8 >> 2/11 Ertapenem 2/11>> (2/14)  Microbiology results: 2/10 blood: ngtd 2/7 Blood: ESBL E Coli 2/7 MRSA PCR neg 2/7 Urine: 70K and 50K/ml ESBL E coli (S: gent, imipenem, nitrofurantoin, Zosyn) 2/7 COVID, flu and RSV: neg  Thank you for allowing pharmacy to be a part of this patient's care.  Lorenso Courier, PharmD Clinical Pharmacist 11/15/2022 11:47 AM

## 2022-11-15 NOTE — Progress Notes (Signed)
Paged to provide imposition of ashes. [place] expressed gratitude for the timeliness of the visit.   Please page as further needs arise.  Donald Prose. Elyn Peers, M.Div. [facility] Chaplain Pager [phone] Office [phone]

## 2022-11-15 NOTE — Progress Notes (Signed)
Mobility Specialist Progress Note   11/15/22 1525  Mobility  Activity Ambulated with assistance in room;Transferred from bed to chair  Level of Assistance Minimal assist, patient does 75% or more  Assistive Device Front wheel walker  Distance Ambulated (ft) 15 ft  Range of Motion/Exercises Active;All extremities  Activity Response Tolerated well   Patient received in supine and agreeable to participate. Requested assistance for pericare prior to ambulation. RN present and completed pericare with dependent level of assist. Independent for bed mobility and required mod A + extra time to stand. Upon standing patient with mild instability, LOB x 2 and posterior lean requiring min A for balance. Complained of dizziness that slowly dissipated with extended standing rest. Was able to ambulate short distance around bed to recliner chair with min A for balance and safety. Was left in recliner with all needs met and RN present.  [place] Jonelle Bann, BS EXP Mobility Specialist Please contact via SecureChat or Rehab office at [phone]

## 2022-11-15 NOTE — Progress Notes (Signed)
PROGRESS NOTE    [name]  [id number] DOB: [birth year] DOA: 11/08/2022 PCP: [name], MD   Brief Narrative: 72 year old with medical history significant for rheumatoid arthritis, GERD, asthma, diverticulosis, obesity, sarcoidosis not on treatment presented with fever and weakness.  She was found to have sepsis secondary to UTI, ESBL E. coli bacteremia right-sided pyelonephritis. This was discussed with ID and recommendation was for Invanz for 10 days, last date of treatment 2/16.  Developed lip swelling on 2/13, she received IV Benadryl.  She received a dose of Invanz today.  I suspect she had allergic reaction  secondary to Invanz.  She has bilateral wheezing on exam today, and persistent lip swelling.  IV Benadryl 3 times daily for 24 hours ordered, IV Solu-Medrol, albuterol scheduled every 6 hours.  I have discussed with ID and we can change Invanz to meropenem.    Assessment & Plan:   Principal Problem:   Bacteremia Active Problems:   Dyspnea   Infection due to ESBL-producing Escherichia coli   Acute pyelonephritis   Sepsis ([place])  1-Sepsis,, POA Secondary to UTI ESBL E. Coli ESBL E. coli bacteremia, POA Right side Pyelonephritis   2-Lips swelling Angioedema;  Suspect related to antibiotics. No other new medication.  Invanz discontinue,.  Monitor closelly.  She has BL wheezing as well.  Schedule albuterol.  IV solumedrol Schedule IV benadryl for 3 doses.  IV pepcid.    -Left elbow pain, unspecified, resolved: Likely secondary to advanced osteoarthritis Rule out septic arthritis: Tender to palpation but without overt findings - on Humira so there is question of septic arthritis  -Xray shows mild joint effusion and advanced arthritis - left elbow aspiration unsuccessful, no fluid was able to be obtained -pain appears to be resolving - likely arthritic in nature.  -On IV antibiotics  Acute metabolic encephalopathy: Resolved In the setting of  infection Back to  baseline  -History of rheumatoid arthritis Hold methotrexate Received Humira dose day of admission per report Continue with oxycodone On gabapentin and Cymbalta Bowel regimen  History of bilateral knee replacement She was on anticoagulation for unclear etiology.  Her last knee surgery was March 2023.  Dr Avon Gully Discussed with orthopedic there is no indication for chronic anticoagulation from their standpoint. Full  dose anticoagulation discontinue.   COPD: Continue with Dulera  Normocytic anemia: Partially dilutional as well.  Monitor hemoglobin    Estimated body mass index is 37.54 kg/m as calculated from the following:   Height as of this encounter: 5' (1.524 m).   Weight as of this encounter: 87.2 kg.   DVT prophylaxis: Lovenox Code Status: Full code Family Communication: care discussed with patient.  Disposition Plan:  Status is: Inpatient Remains inpatient appropriate because: management of infection. Home 2/16 after completion of antibiotics     Consultants:  ID phone consultation   Procedures:  None  Antimicrobials:    Subjective: She is alert, report some improvement of lips swelling after benadryl but persist.  Just completed Invanz.  Having Wheezing.   Objective: Vitals:   11/14/22 1611 11/14/22 1944 11/15/22 0539 11/15/22 1026  BP: (!) 146/78 128/81 122/69 (!) 158/69  Pulse: 86 88 86 86  Resp: 15 16 17 16  $ Temp: 98.1 F (36.7 C) 98.6 F (37 C) 97.8 F (36.6 C) 98.4 F (36.9 C)  TempSrc: Oral Oral Oral Oral  SpO2: 95% 96% 97% 94%  Weight:      Height:        Intake/Output Summary (Last 24  hours) at 11/15/2022 1548 Last data filed at 11/15/2022 1517 Gross per 24 hour  Intake 1510 ml  Output 300 ml  Net 1210 ml   Filed Weights   11/08/22 1451 11/08/22 2318  Weight: 81.6 kg 87.2 kg    Examination:  General exam: Appears calm and comfortable  Respiratory system: BL wheezing.  Cardiovascular system: S1 & S2  heard, RRR.  Gastrointestinal system: Abdomen is nondistended, soft and nontender. No organomegaly or masses felt. Normal bowel sounds heard. Central nervous system: Alert and oriented.  Extremities: Symmetric 5 x 5 power.   Data Reviewed: I have personally reviewed following labs and imaging studies  CBC: Recent Labs  Lab 11/11/22 0700 11/12/22 1156 11/13/22 0309 11/14/22 0228 11/15/22 0045  WBC 5.9 4.0 4.9 7.2 7.2  HGB 11.3* 10.6* 10.8* 10.3* 10.3*  HCT 36.7 35.0* 34.4* 32.9* 32.3*  MCV 100.8* 98.0 97.2 96.8 96.4  PLT 234 265 266 275 [id number]   Basic Metabolic Panel: Recent Labs  Lab 11/11/22 0700 11/12/22 1156 11/13/22 0309 11/14/22 0228 11/15/22 0045  NA 137 138 138 139 139  K 4.6 3.7 3.9 3.6 3.7  CL 104 103 101 102 102  CO2 23 26 26 30 28  $ GLUCOSE 96 118* 107* 102* 104*  BUN 8 <5* 7* 6* 6*  CREATININE 0.62 0.51 0.55 0.51 0.55  CALCIUM 8.3* 8.2* 8.2* 8.1* 7.8*   GFR: Estimated Creatinine Clearance: 61.5 mL/min (by C-G formula based on SCr of 0.55 mg/dL). Liver Function Tests: Recent Labs  Lab 11/09/22 0023  AST 26  ALT 20  ALKPHOS 75  BILITOT 0.7  PROT 6.0*  ALBUMIN 2.9*   No results for input(s): "LIPASE", "AMYLASE" in the last 168 hours. No results for input(s): "AMMONIA" in the last 168 hours. Coagulation Profile: No results for input(s): "INR", "PROTIME" in the last 168 hours. Cardiac Enzymes: No results for input(s): "CKTOTAL", "CKMB", "CKMBINDEX", "TROPONINI" in the last 168 hours. BNP (last 3 results) No results for input(s): "PROBNP" in the last 8760 hours. HbA1C: No results for input(s): "HGBA1C" in the last 72 hours. CBG: No results for input(s): "GLUCAP" in the last 168 hours. Lipid Profile: No results for input(s): "CHOL", "HDL", "LDLCALC", "TRIG", "CHOLHDL", "LDLDIRECT" in the last 72 hours. Thyroid Function Tests: No results for input(s): "TSH", "T4TOTAL", "FREET4", "T3FREE", "THYROIDAB" in the last 72 hours. Anemia Panel: No results  for input(s): "VITAMINB12", "FOLATE", "FERRITIN", "TIBC", "IRON", "RETICCTPCT" in the last 72 hours. Sepsis Labs: Recent Labs  Lab 11/08/22 1838 11/09/22 0023  PROCALCITON  --  4.79  LATICACIDVEN 1.3  --     Recent Results (from the past 240 hour(s))  Culture, blood (Routine x 2)     Status: Abnormal   Collection Time: 11/08/22  3:07 PM   Specimen: BLOOD RIGHT FOREARM  Result Value Ref Range Status   Specimen Description BLOOD RIGHT FOREARM  Final   Special Requests   Final    BOTTLES DRAWN AEROBIC AND ANAEROBIC Blood Culture adequate volume   Culture  Setup Time   Final    GRAM NEGATIVE RODS IN BOTH AEROBIC AND ANAEROBIC BOTTLES CRITICAL RESULT CALLED TO, READ BACK BY AND VERIFIED WITH: PHARMD G. LEDFORD 11/09/22 @ 0602 BY AB Performed at [facility], [place] [address], [place], [place] [address]    Culture (A)  Final    ESCHERICHIA COLI Confirmed Extended Spectrum Beta-Lactamase Producer (ESBL).  In bloodstream infections from ESBL organisms, carbapenems are preferred over piperacillin/tazobactam. They are shown to have a  lower risk of mortality.    Report Status 11/11/2022 FINAL  Final   Organism ID, Bacteria ESCHERICHIA COLI  Final      Susceptibility   Escherichia coli - MIC*    AMPICILLIN >=32 RESISTANT Resistant     CEFAZOLIN >=64 RESISTANT Resistant     CEFEPIME >=32 RESISTANT Resistant     CEFTAZIDIME >=64 RESISTANT Resistant     CEFTRIAXONE >=64 RESISTANT Resistant     CIPROFLOXACIN >=4 RESISTANT Resistant     GENTAMICIN <=1 SENSITIVE Sensitive     IMIPENEM <=0.25 SENSITIVE Sensitive     TRIMETH/SULFA >=320 RESISTANT Resistant     AMPICILLIN/SULBACTAM >=32 RESISTANT Resistant     PIP/TAZO 8 SENSITIVE Sensitive     * ESCHERICHIA COLI  Blood Culture ID Panel (Reflexed)     Status: Abnormal   Collection Time: 11/08/22  3:07 PM  Result Value Ref Range Status   Enterococcus faecalis NOT DETECTED NOT DETECTED Final   Enterococcus Faecium NOT DETECTED NOT  DETECTED Final   Listeria monocytogenes NOT DETECTED NOT DETECTED Final   Staphylococcus species NOT DETECTED NOT DETECTED Final   Staphylococcus aureus (BCID) NOT DETECTED NOT DETECTED Final   Staphylococcus epidermidis NOT DETECTED NOT DETECTED Final   Staphylococcus lugdunensis NOT DETECTED NOT DETECTED Final   Streptococcus species NOT DETECTED NOT DETECTED Final   Streptococcus agalactiae NOT DETECTED NOT DETECTED Final   Streptococcus pneumoniae NOT DETECTED NOT DETECTED Final   Streptococcus pyogenes NOT DETECTED NOT DETECTED Final   A.calcoaceticus-baumannii NOT DETECTED NOT DETECTED Final   Bacteroides fragilis NOT DETECTED NOT DETECTED Final   Enterobacterales DETECTED (A) NOT DETECTED Final    Comment: Enterobacterales represent a large order of gram negative bacteria, not a single organism. CRITICAL RESULT CALLED TO, READ BACK BY AND VERIFIED WITH: PHARMD G. LEDFORD 11/09/22 @ 0602 BY AB    Enterobacter cloacae complex NOT DETECTED NOT DETECTED Final   Escherichia coli DETECTED (A) NOT DETECTED Final    Comment: CRITICAL RESULT CALLED TO, READ BACK BY AND VERIFIED WITH: PHARMD G. LEDFORD 11/09/22 @ 0602 BY AB    Klebsiella aerogenes NOT DETECTED NOT DETECTED Final   Klebsiella oxytoca NOT DETECTED NOT DETECTED Final   Klebsiella pneumoniae NOT DETECTED NOT DETECTED Final   Proteus species NOT DETECTED NOT DETECTED Final   Salmonella species NOT DETECTED NOT DETECTED Final   Serratia marcescens NOT DETECTED NOT DETECTED Final   Haemophilus influenzae NOT DETECTED NOT DETECTED Final   Neisseria meningitidis NOT DETECTED NOT DETECTED Final   Pseudomonas aeruginosa NOT DETECTED NOT DETECTED Final   Stenotrophomonas maltophilia NOT DETECTED NOT DETECTED Final   Candida albicans NOT DETECTED NOT DETECTED Final   Candida auris NOT DETECTED NOT DETECTED Final   Candida glabrata NOT DETECTED NOT DETECTED Final   Candida krusei NOT DETECTED NOT DETECTED Final   Candida parapsilosis  NOT DETECTED NOT DETECTED Final   Candida tropicalis NOT DETECTED NOT DETECTED Final   Cryptococcus neoformans/gattii NOT DETECTED NOT DETECTED Final   CTX-M ESBL DETECTED (A) NOT DETECTED Final    Comment: CRITICAL RESULT CALLED TO, READ BACK BY AND VERIFIED WITH: PHARMD G. LEDFORD 11/09/22 @ 0602 BY AB (NOTE) Extended spectrum beta-lactamase detected. Recommend a carbapenem as initial therapy.      Carbapenem resistance IMP NOT DETECTED NOT DETECTED Final   Carbapenem resistance KPC NOT DETECTED NOT DETECTED Final   Carbapenem resistance NDM NOT DETECTED NOT DETECTED Final   Carbapenem resist OXA 48 LIKE NOT DETECTED NOT DETECTED Final  Carbapenem resistance VIM NOT DETECTED NOT DETECTED Final    Comment: Performed at [facility], [place] [address] [place], [place] [address]  Resp panel by RT-PCR (RSV, Flu A&B, Covid) Anterior Nasal Swab     Status: None   Collection Time: 11/08/22  3:12 PM   Specimen: Anterior Nasal Swab  Result Value Ref Range Status   SARS Coronavirus 2 by RT PCR NEGATIVE NEGATIVE Final   Influenza A by PCR NEGATIVE NEGATIVE Final   Influenza B by PCR NEGATIVE NEGATIVE Final    Comment: (NOTE) The Xpert Xpress SARS-CoV-2/FLU/RSV plus assay is intended as an aid in the diagnosis of influenza from Nasopharyngeal swab specimens and should not be used as a sole basis for treatment. Nasal washings and aspirates are unacceptable for Xpert Xpress SARS-CoV-2/FLU/RSV testing.  Fact Sheet for Patients: EntrepreneurPulse.com.au  Fact Sheet for Healthcare Providers: IncredibleEmployment.be  This test is not yet approved or cleared by the [place] FDA and has been authorized for detection and/or diagnosis of SARS-CoV-2 by FDA under an Emergency Use Authorization (EUA). This EUA will remain in effect (meaning this test can be used) for the duration of the COVID-19 declaration under Section 564(b)(1) of the Act, 21  U.S.C. section 360bbb-3(b)(1), unless the authorization is terminated or revoked.     Resp Syncytial Virus by PCR NEGATIVE NEGATIVE Final    Comment: (NOTE) Fact Sheet for Patients: EntrepreneurPulse.com.au  Fact Sheet for Healthcare Providers: IncredibleEmployment.be  This test is not yet approved or cleared by the [place] FDA and has been authorized for detection and/or diagnosis of SARS-CoV-2 by FDA under an Emergency Use Authorization (EUA). This EUA will remain in effect (meaning this test can be used) for the duration of the COVID-19 declaration under Section 564(b)(1) of the Act, 21 U.S.C. section 360bbb-3(b)(1), unless the authorization is terminated or revoked.  Performed at [facility], [place] [address] [place], [place] [address]   Culture, blood (Routine x 2)     Status: Abnormal   Collection Time: 11/08/22  6:38 PM   Specimen: BLOOD  Result Value Ref Range Status   Specimen Description BLOOD RIGHT ANTECUBITAL  Final   Special Requests   Final    BOTTLES DRAWN AEROBIC AND ANAEROBIC Blood Culture adequate volume   Culture  Setup Time   Final    GRAM NEGATIVE RODS IN BOTH AEROBIC AND ANAEROBIC BOTTLES CRITICAL VALUE NOTED.  VALUE IS CONSISTENT WITH PREVIOUSLY REPORTED AND CALLED VALUE.    Culture (A)  Final    ESCHERICHIA COLI SUSCEPTIBILITIES PERFORMED ON PREVIOUS CULTURE WITHIN THE LAST 5 DAYS. Performed at [facility], [place] [address], [place], [place] [address]    Report Status 11/11/2022 FINAL  Final  Urine Culture (for pregnant, neutropenic or urologic patients or patients with an indwelling urinary catheter)     Status: Abnormal   Collection Time: 11/08/22  8:27 PM   Specimen: Urine, Clean Catch  Result Value Ref Range Status   Specimen Description URINE, CLEAN CATCH  Final   Special Requests   Final    NONE Performed at [facility], [place] [address] [place], [place] [address]    Culture (A)   Final    70,000 COLONIES/mL ESCHERICHIA COLI Confirmed Extended Spectrum Beta-Lactamase Producer (ESBL).  In bloodstream infections from ESBL organisms, carbapenems are preferred over piperacillin/tazobactam. They are shown to have a lower risk of mortality.    Report Status 11/10/2022 FINAL  Final   Organism ID,  Bacteria ESCHERICHIA COLI (A)  Final      Susceptibility   Escherichia coli - MIC*    AMPICILLIN >=32 RESISTANT Resistant     CEFAZOLIN >=64 RESISTANT Resistant     CEFEPIME >=32 RESISTANT Resistant     CEFTRIAXONE >=64 RESISTANT Resistant     CIPROFLOXACIN >=4 RESISTANT Resistant     GENTAMICIN <=1 SENSITIVE Sensitive     IMIPENEM <=0.25 SENSITIVE Sensitive     NITROFURANTOIN <=16 SENSITIVE Sensitive     TRIMETH/SULFA >=320 RESISTANT Resistant     AMPICILLIN/SULBACTAM >=32 RESISTANT Resistant     PIP/TAZO 8 SENSITIVE Sensitive     * 70,000 COLONIES/mL ESCHERICHIA COLI  Urine Culture (for pregnant, neutropenic or urologic patients or patients with an indwelling urinary catheter)     Status: Abnormal   Collection Time: 11/08/22 11:30 PM   Specimen: Urine, Clean Catch  Result Value Ref Range Status   Specimen Description URINE, CLEAN CATCH  Final   Special Requests   Final    NONE Performed at [facility], [address], [place], [place] [address]    Culture (A)  Final    50,000 COLONIES/mL ESCHERICHIA COLI Confirmed Extended Spectrum Beta-Lactamase Producer (ESBL).  In bloodstream infections from ESBL organisms, carbapenems are preferred over piperacillin/tazobactam. They are shown to have a lower risk of mortality.    Report Status 11/10/2022 FINAL  Final   Organism ID, Bacteria ESCHERICHIA COLI (A)  Final      Susceptibility   Escherichia coli - MIC*    AMPICILLIN >=32 RESISTANT Resistant     CEFAZOLIN >=64 RESISTANT Resistant     CEFEPIME >=32 RESISTANT Resistant     CEFTRIAXONE >=64 RESISTANT Resistant     CIPROFLOXACIN >=4 RESISTANT Resistant      GENTAMICIN <=1 SENSITIVE Sensitive     IMIPENEM <=0.25 SENSITIVE Sensitive     NITROFURANTOIN <=16 SENSITIVE Sensitive     TRIMETH/SULFA >=320 RESISTANT Resistant     AMPICILLIN/SULBACTAM >=32 RESISTANT Resistant     PIP/TAZO 8 SENSITIVE Sensitive     * 50,000 COLONIES/mL ESCHERICHIA COLI  MRSA Next Gen by PCR, Nasal     Status: None   Collection Time: 11/08/22 11:30 PM   Specimen: Nasal Mucosa; Nasal Swab  Result Value Ref Range Status   MRSA by PCR Next Gen NOT DETECTED NOT DETECTED Final    Comment: (NOTE) The GeneXpert MRSA Assay (FDA approved for NASAL specimens only), is one component of a comprehensive MRSA colonization surveillance program. It is not intended to diagnose MRSA infection nor to guide or monitor treatment for MRSA infections. Test performance is not FDA approved in patients less than 12 years old. Performed at [facility], [place] [address], [place], [place] [address]   Culture, blood (Routine X 2) w Reflex to ID Panel     Status: None (Preliminary result)   Collection Time: 11/11/22  7:11 AM   Specimen: BLOOD LEFT HAND  Result Value Ref Range Status   Specimen Description BLOOD LEFT HAND  Final   Special Requests   Final    BOTTLES DRAWN AEROBIC ONLY Blood Culture adequate volume   Culture   Final    NO GROWTH 4 DAYS Performed at [facility], [place] [address], [place], [place] [address]    Report Status PENDING  Incomplete  Culture, blood (Routine X 2) w Reflex to ID Panel     Status: None (Preliminary result)   Collection Time: 11/11/22  7:20 AM  Specimen: BLOOD RIGHT HAND  Result Value Ref Range Status   Specimen Description BLOOD RIGHT HAND  Final   Special Requests   Final    BOTTLES DRAWN AEROBIC ONLY Blood Culture adequate volume   Culture   Final    NO GROWTH 4 DAYS Performed at [facility], [address] [place], [place] [address]    Report Status PENDING  Incomplete         Radiology Studies: No results  found.      Scheduled Meds:  albuterol  2.5 mg Nebulization Q6H   cycloSPORINE  1 drop Both Eyes BID   dextromethorphan-guaiFENesin  1 tablet Oral BID   diphenhydrAMINE  25 mg Intravenous TID   docusate sodium  100 mg Oral Daily   DULoxetine  30 mg Oral Daily   enoxaparin (LOVENOX) injection  40 mg Subcutaneous Q24H   fluticasone  1 spray Each Nare Daily   gabapentin  300 mg Oral BID   mometasone-formoterol  2 puff Inhalation BID   oxyCODONE  20 mg Oral Q12H   pantoprazole  40 mg Oral Daily   psyllium  1 packet Oral Daily   senna-docusate  1 tablet Oral Q M,W,F-2000   Continuous Infusions:  famotidine (PEPCID) IV Stopped (11/15/22 1141)   [START ON 11/16/2022] meropenem (MERREM) IV       LOS: 7 days    Time spent: 35 minutes    Shirleyann Montero A Kiyara Bouffard, MD [facility]   If 7PM-7AM, please contact night-coverage [email or web address]  11/15/2022, 3:48 PM

## 2022-11-16 DIAGNOSIS — R7881 Bacteremia: Secondary | ICD-10-CM | POA: Diagnosis not present

## 2022-11-16 LAB — CULTURE, BLOOD (ROUTINE X 2)
Culture: NO GROWTH
Culture: NO GROWTH
Special Requests: ADEQUATE
Special Requests: ADEQUATE

## 2022-11-16 NOTE — Assessment & Plan Note (Signed)
Sepsis resolved. Due to pyelonephritis secondary to ESBL E. Coli grown from urine and 2/2.The patient is receiving Meropenem. Blood cultures x 2 from 11/08/2022 also positive for ESBL E. Coli. Repeat blood cultures from 11/11/2022 has had no growth for 5 days.

## 2022-11-16 NOTE — Assessment & Plan Note (Signed)
ESBL E. Coli grown from urine and 2/2.The patient is receiving Meropenem. Blood cultures x 2 from 11/08/2022 also positive for ESBL E. Coli. Repeat blood cultures from 11/11/2022 has had no growth for 5 days.

## 2022-11-16 NOTE — Assessment & Plan Note (Signed)
Blood cultures x 2 from 11/08/2022 also positive for ESBL E. Coli. Repeat blood cultures from 11/11/2022 has had no growth for 5 days. She is receiving Meropenem. Last day of IV antibiotics is 11/18/2022. Per pharmacy no need for conversion to oral antibiotics.

## 2022-11-16 NOTE — Hospital Course (Addendum)
72 year old with medical history significant for rheumatoid arthritis, GERD, asthma, diverticulosis, obesity, sarcoidosis not on treatment presented with fever and weakness.  She was found to have sepsis secondary to UTI, ESBL E. coli bacteremia right-sided pyelonephritis. This was discussed with ID and recommendation was for Invanz for 10 days, last date of treatment 2/16.   Developed lip swelling on 2/13, she received IV Benadryl.  She received a dose of Invanz today.  I suspect she had allergic reaction  secondary to Invanz.  She has bilateral wheezing on exam today, and persistent lip swelling.  IV Benadryl 3 times daily for 24 hours ordered, IV Solu-Medrol, albuterol scheduled every 6 hours.   Invanz was changed to meropenem. The patient's last day of antibiotics will be 11/18/2022. No need for conversion to oral antibiotics per pharmacy.

## 2022-11-16 NOTE — Progress Notes (Signed)
PROGRESS NOTE  [name] [id number] DOB: 07/23/1951 DOA: 11/08/2022 PCP: [name], MD  Brief History   72 year old with medical history significant for rheumatoid arthritis, GERD, asthma, diverticulosis, obesity, sarcoidosis not on treatment presented with fever and weakness.  She was found to have sepsis secondary to UTI, ESBL E. coli bacteremia right-sided pyelonephritis. This was discussed with ID and recommendation was for Invanz for 10 days, last date of treatment 2/17.   Developed lip swelling on 2/13, she received IV Benadryl.  She received a dose of Invanz today.  I suspect she had allergic reaction  secondary to Invanz.  She has bilateral wheezing on exam today, and persistent lip swelling.  IV Benadryl 3 times daily for 24 hours ordered, IV Solu-Medrol, albuterol scheduled every 6 hours.   I have discussed with ID and we can change Invanz to meropenem.  Last day of IV antibiotics per pharmacy is 11/18/2022. No need to convert to oral antibiotics.  Consultants  None  Procedures  None  Antibiotics   Anti-infectives (From admission, onward)    Start     Dose/Rate Route Frequency Ordered Stop   11/16/22 0600  meropenem (MERREM) 1 g in sodium chloride 0.9 % 100 mL IVPB        1 g 200 mL/hr over 30 Minutes Intravenous Every 8 hours 11/15/22 1107 11/18/22 2159   11/12/22 1200  ertapenem (INVANZ) 1,000 mg in sodium chloride 0.9 % 100 mL IVPB  Status:  Discontinued       Note to Pharmacy: 10 day total course (2/11 is day #4 Meropenem)   1 g 200 mL/hr over 30 Minutes Intravenous Every 24 hours 11/12/22 1047 11/15/22 1033   11/09/22 2300  vancomycin (VANCOREADY) IVPB 750 mg/150 mL  Status:  Discontinued        750 mg 150 mL/hr over 60 Minutes Intravenous Every 24 hours 11/08/22 2151 11/12/22 1107   11/09/22 0715  meropenem (MERREM) 1 g in sodium chloride 0.9 % 100 mL IVPB  Status:  Discontinued        1 g 200 mL/hr over 30 Minutes Intravenous Every 8 hours 11/09/22  0616 11/12/22 1047   11/08/22 2300  ceFEPIme (MAXIPIME) 2 g in sodium chloride 0.9 % 100 mL IVPB  Status:  Discontinued        2 g 200 mL/hr over 30 Minutes Intravenous Every 12 hours 11/08/22 2123 11/09/22 0616   11/08/22 2130  vancomycin (VANCOREADY) IVPB 1750 mg/350 mL        1,750 mg 175 mL/hr over 120 Minutes Intravenous  Once 11/08/22 2123 11/09/22 0047   11/08/22 1845  cefTRIAXone (ROCEPHIN) 2 g in sodium chloride 0.9 % 100 mL IVPB        2 g 200 mL/hr over 30 Minutes Intravenous  Once 11/08/22 1838 11/08/22 1943        Subjective  The patient is resting comfortably. No new complaints. Anxious to go home.  Objective   Vitals:  Vitals:   11/16/22 0857 11/16/22 1611  BP: (!) 147/74 (!) 145/68  Pulse: 99 (!) 105  Resp: 17 19  Temp: 97.7 F (36.5 C) 97.6 F (36.4 C)  SpO2: 99% 96%    Exam:  Constitutional:  Appears calm and comfortable Respiratory:  CTA bilaterally, no w/r/r.  Respiratory effort normal. No retractions or accessory muscle use Cardiovascular:  RRR, no m/r/g No LE extremity edema   Normal pedal pulses Abdomen:  Abdomen appears normal; no tenderness or masses No hernias No  HSM Musculoskeletal:  Digits/nails BUE: no clubbing, cyanosis, petechiae, infection exam of joints, bones, muscles of at least one of following: head/neck, RUE, LUE, RLE, LLE   strength and tone normal, no atrophy, no abnormal movements No tenderness, masses Normal ROM, no contractures  gait and station Skin:  No rashes, lesions, ulcers palpation of skin: no induration or nodules Neurologic:  CN 2-12 intact Sensation all 4 extremities intact Psychiatric:  Mental status Mood, affect appropriate Orientation to person, place, time  judgment and insight appear intact     I have personally reviewed the following:   Today's Data   Vitals:   11/16/22 0857 11/16/22 1611  BP: (!) 147/74 (!) 145/68  Pulse: 99 (!) 105  Resp: 17 19  Temp: 97.7 F (36.5 C) 97.6 F  (36.4 C)  SpO2: 99% 96%     Lab Data  CBC    Component Value Date/Time   WBC 7.2 11/15/2022 0045   RBC 3.35 (L) 11/15/2022 0045   HGB 10.3 (L) 11/15/2022 0045   HCT 32.3 (L) 11/15/2022 0045   PLT 299 11/15/2022 0045   MCV 96.4 11/15/2022 0045   MCH 30.7 11/15/2022 0045   MCHC 31.9 11/15/2022 0045   RDW 16.5 (H) 11/15/2022 0045   LYMPHSABS 0.9 11/08/2022 1500   MONOABS 0.7 11/08/2022 1500   EOSABS 0.1 11/08/2022 1500   BASOSABS 0.0 11/08/2022 1500      Latest Ref Rng & Units 11/15/2022   12:45 AM 11/14/2022    2:28 AM 11/13/2022    3:09 AM  BMP  Glucose 70 - 99 mg/dL 104  102  107   BUN 8 - 23 mg/dL 6  6  7   $ Creatinine 0.44 - 1.00 mg/dL 0.55  0.51  0.55   Sodium 135 - 145 mmol/L 139  139  138   Potassium 3.5 - 5.1 mmol/L 3.7  3.6  3.9   Chloride 98 - 111 mmol/L 102  102  101   CO2 22 - 32 mmol/L 28  30  26   $ Calcium 8.9 - 10.3 mg/dL 7.8  8.1  8.2      Micro Data   Results for orders placed or performed during the hospital encounter of 11/08/22  Culture, blood (Routine x 2)     Status: Abnormal   Collection Time: 11/08/22  3:07 PM   Specimen: BLOOD RIGHT FOREARM  Result Value Ref Range Status   Specimen Description BLOOD RIGHT FOREARM  Final   Special Requests   Final    BOTTLES DRAWN AEROBIC AND ANAEROBIC Blood Culture adequate volume   Culture  Setup Time   Final    GRAM NEGATIVE RODS IN BOTH AEROBIC AND ANAEROBIC BOTTLES CRITICAL RESULT CALLED TO, READ BACK BY AND VERIFIED WITH: PHARMD G. LEDFORD 11/09/22 @ 0602 BY AB Performed at [facility], [place]. [address] [place], [place] [address]    Culture (A)  Final    ESCHERICHIA COLI Confirmed Extended Spectrum Beta-Lactamase Producer (ESBL).  In bloodstream infections from ESBL organisms, carbapenems are preferred over piperacillin/tazobactam. They are shown to have a lower risk of mortality.    Report Status 11/11/2022 FINAL  Final   Organism ID, Bacteria ESCHERICHIA COLI  Final      Susceptibility    Escherichia coli - MIC*    AMPICILLIN >=32 RESISTANT Resistant     CEFAZOLIN >=64 RESISTANT Resistant     CEFEPIME >=32 RESISTANT Resistant     CEFTAZIDIME >=64 RESISTANT Resistant  CEFTRIAXONE >=64 RESISTANT Resistant     CIPROFLOXACIN >=4 RESISTANT Resistant     GENTAMICIN <=1 SENSITIVE Sensitive     IMIPENEM <=0.25 SENSITIVE Sensitive     TRIMETH/SULFA >=320 RESISTANT Resistant     AMPICILLIN/SULBACTAM >=32 RESISTANT Resistant     PIP/TAZO 8 SENSITIVE Sensitive     * ESCHERICHIA COLI  Blood Culture ID Panel (Reflexed)     Status: Abnormal   Collection Time: 11/08/22  3:07 PM  Result Value Ref Range Status   Enterococcus faecalis NOT DETECTED NOT DETECTED Final   Enterococcus Faecium NOT DETECTED NOT DETECTED Final   Listeria monocytogenes NOT DETECTED NOT DETECTED Final   Staphylococcus species NOT DETECTED NOT DETECTED Final   Staphylococcus aureus (BCID) NOT DETECTED NOT DETECTED Final   Staphylococcus epidermidis NOT DETECTED NOT DETECTED Final   Staphylococcus lugdunensis NOT DETECTED NOT DETECTED Final   Streptococcus species NOT DETECTED NOT DETECTED Final   Streptococcus agalactiae NOT DETECTED NOT DETECTED Final   Streptococcus pneumoniae NOT DETECTED NOT DETECTED Final   Streptococcus pyogenes NOT DETECTED NOT DETECTED Final   A.calcoaceticus-baumannii NOT DETECTED NOT DETECTED Final   Bacteroides fragilis NOT DETECTED NOT DETECTED Final   Enterobacterales DETECTED (A) NOT DETECTED Final    Comment: Enterobacterales represent a large order of gram negative bacteria, not a single organism. CRITICAL RESULT CALLED TO, READ BACK BY AND VERIFIED WITH: PHARMD G. LEDFORD 11/09/22 @ 0602 BY AB    Enterobacter cloacae complex NOT DETECTED NOT DETECTED Final   Escherichia coli DETECTED (A) NOT DETECTED Final    Comment: CRITICAL RESULT CALLED TO, READ BACK BY AND VERIFIED WITH: PHARMD G. LEDFORD 11/09/22 @ 0602 BY AB    Klebsiella aerogenes NOT DETECTED NOT DETECTED Final    Klebsiella oxytoca NOT DETECTED NOT DETECTED Final   Klebsiella pneumoniae NOT DETECTED NOT DETECTED Final   Proteus species NOT DETECTED NOT DETECTED Final   Salmonella species NOT DETECTED NOT DETECTED Final   Serratia marcescens NOT DETECTED NOT DETECTED Final   Haemophilus influenzae NOT DETECTED NOT DETECTED Final   Neisseria meningitidis NOT DETECTED NOT DETECTED Final   Pseudomonas aeruginosa NOT DETECTED NOT DETECTED Final   Stenotrophomonas maltophilia NOT DETECTED NOT DETECTED Final   Candida albicans NOT DETECTED NOT DETECTED Final   Candida auris NOT DETECTED NOT DETECTED Final   Candida glabrata NOT DETECTED NOT DETECTED Final   Candida krusei NOT DETECTED NOT DETECTED Final   Candida parapsilosis NOT DETECTED NOT DETECTED Final   Candida tropicalis NOT DETECTED NOT DETECTED Final   Cryptococcus neoformans/gattii NOT DETECTED NOT DETECTED Final   CTX-M ESBL DETECTED (A) NOT DETECTED Final    Comment: CRITICAL RESULT CALLED TO, READ BACK BY AND VERIFIED WITH: PHARMD G. LEDFORD 11/09/22 @ 0602 BY AB (NOTE) Extended spectrum beta-lactamase detected. Recommend a carbapenem as initial therapy.      Carbapenem resistance IMP NOT DETECTED NOT DETECTED Final   Carbapenem resistance KPC NOT DETECTED NOT DETECTED Final   Carbapenem resistance NDM NOT DETECTED NOT DETECTED Final   Carbapenem resist OXA 48 LIKE NOT DETECTED NOT DETECTED Final   Carbapenem resistance VIM NOT DETECTED NOT DETECTED Final    Comment: Performed at [facility], [place] [address] [place], [place] [address]  Resp panel by RT-PCR (RSV, Flu A&B, Covid) Anterior Nasal Swab     Status: None   Collection Time: 11/08/22  3:12 PM   Specimen: Anterior Nasal Swab  Result Value Ref Range Status   SARS Coronavirus 2 by  RT PCR NEGATIVE NEGATIVE Final   Influenza A by PCR NEGATIVE NEGATIVE Final   Influenza B by PCR NEGATIVE NEGATIVE Final    Comment: (NOTE) The Xpert Xpress SARS-CoV-2/FLU/RSV plus assay is  intended as an aid in the diagnosis of influenza from Nasopharyngeal swab specimens and should not be used as a sole basis for treatment. Nasal washings and aspirates are unacceptable for Xpert Xpress SARS-CoV-2/FLU/RSV testing.  Fact Sheet for Patients: EntrepreneurPulse.com.au  Fact Sheet for Healthcare Providers: IncredibleEmployment.be  This test is not yet approved or cleared by the [place] FDA and has been authorized for detection and/or diagnosis of SARS-CoV-2 by FDA under an Emergency Use Authorization (EUA). This EUA will remain in effect (meaning this test can be used) for the duration of the COVID-19 declaration under Section 564(b)(1) of the Act, 21 U.S.C. section 360bbb-3(b)(1), unless the authorization is terminated or revoked.     Resp Syncytial Virus by PCR NEGATIVE NEGATIVE Final    Comment: (NOTE) Fact Sheet for Patients: EntrepreneurPulse.com.au  Fact Sheet for Healthcare Providers: IncredibleEmployment.be  This test is not yet approved or cleared by the [place] FDA and has been authorized for detection and/or diagnosis of SARS-CoV-2 by FDA under an Emergency Use Authorization (EUA). This EUA will remain in effect (meaning this test can be used) for the duration of the COVID-19 declaration under Section 564(b)(1) of the Act, 21 U.S.C. section 360bbb-3(b)(1), unless the authorization is terminated or revoked.  Performed at [facility], [place] [address], [place], [place] [address]   Culture, blood (Routine x 2)     Status: Abnormal   Collection Time: 11/08/22  6:38 PM   Specimen: BLOOD  Result Value Ref Range Status   Specimen Description BLOOD RIGHT ANTECUBITAL  Final   Special Requests   Final    BOTTLES DRAWN AEROBIC AND ANAEROBIC Blood Culture adequate volume   Culture  Setup Time   Final    GRAM NEGATIVE RODS IN BOTH AEROBIC AND ANAEROBIC BOTTLES CRITICAL  VALUE NOTED.  VALUE IS CONSISTENT WITH PREVIOUSLY REPORTED AND CALLED VALUE.    Culture (A)  Final    ESCHERICHIA COLI SUSCEPTIBILITIES PERFORMED ON PREVIOUS CULTURE WITHIN THE LAST 5 DAYS. Performed at [facility], [place] [address], [place], [place] [address]    Report Status 11/11/2022 FINAL  Final  Urine Culture (for pregnant, neutropenic or urologic patients or patients with an indwelling urinary catheter)     Status: Abnormal   Collection Time: 11/08/22  8:27 PM   Specimen: Urine, Clean Catch  Result Value Ref Range Status   Specimen Description URINE, CLEAN CATCH  Final   Special Requests   Final    NONE Performed at [facility], [place] [address] [place],  [address]    Culture (A)  Final    70,000 COLONIES/mL ESCHERICHIA COLI Confirmed Extended Spectrum Beta-Lactamase Producer (ESBL).  In bloodstream infections from ESBL organisms, carbapenems are preferred over piperacillin/tazobactam. They are shown to have a lower risk of mortality.    Report Status 11/10/2022 FINAL  Final   Organism ID, Bacteria ESCHERICHIA COLI (A)  Final      Susceptibility   Escherichia coli - MIC*    AMPICILLIN >=32 RESISTANT Resistant     CEFAZOLIN >=64 RESISTANT Resistant     CEFEPIME >=32 RESISTANT Resistant     CEFTRIAXONE >=64 RESISTANT Resistant     CIPROFLOXACIN >=4 RESISTANT Resistant     GENTAMICIN <=1 SENSITIVE Sensitive  IMIPENEM <=0.25 SENSITIVE Sensitive     NITROFURANTOIN <=16 SENSITIVE Sensitive     TRIMETH/SULFA >=320 RESISTANT Resistant     AMPICILLIN/SULBACTAM >=32 RESISTANT Resistant     PIP/TAZO 8 SENSITIVE Sensitive     * 70,000 COLONIES/mL ESCHERICHIA COLI  Urine Culture (for pregnant, neutropenic or urologic patients or patients with an indwelling urinary catheter)     Status: Abnormal   Collection Time: 11/08/22 11:30 PM   Specimen: Urine, Clean Catch  Result Value Ref Range Status   Specimen Description URINE, CLEAN CATCH  Final   Special Requests    Final    NONE Performed at [facility], [place] [address], [place], [place] [address]    Culture (A)  Final    50,000 COLONIES/mL ESCHERICHIA COLI Confirmed Extended Spectrum Beta-Lactamase Producer (ESBL).  In bloodstream infections from ESBL organisms, carbapenems are preferred over piperacillin/tazobactam. They are shown to have a lower risk of mortality.    Report Status 11/10/2022 FINAL  Final   Organism ID, Bacteria ESCHERICHIA COLI (A)  Final      Susceptibility   Escherichia coli - MIC*    AMPICILLIN >=32 RESISTANT Resistant     CEFAZOLIN >=64 RESISTANT Resistant     CEFEPIME >=32 RESISTANT Resistant     CEFTRIAXONE >=64 RESISTANT Resistant     CIPROFLOXACIN >=4 RESISTANT Resistant     GENTAMICIN <=1 SENSITIVE Sensitive     IMIPENEM <=0.25 SENSITIVE Sensitive     NITROFURANTOIN <=16 SENSITIVE Sensitive     TRIMETH/SULFA >=320 RESISTANT Resistant     AMPICILLIN/SULBACTAM >=32 RESISTANT Resistant     PIP/TAZO 8 SENSITIVE Sensitive     * 50,000 COLONIES/mL ESCHERICHIA COLI  MRSA Next Gen by PCR, Nasal     Status: None   Collection Time: 11/08/22 11:30 PM   Specimen: Nasal Mucosa; Nasal Swab  Result Value Ref Range Status   MRSA by PCR Next Gen NOT DETECTED NOT DETECTED Final    Comment: (NOTE) The GeneXpert MRSA Assay (FDA approved for NASAL specimens only), is one component of a comprehensive MRSA colonization surveillance program. It is not intended to diagnose MRSA infection nor to guide or monitor treatment for MRSA infections. Test performance is not FDA approved in patients less than 67 years old. Performed at [facility], [place] [address], [place], [place] [address]   Culture, blood (Routine X 2) w Reflex to ID Panel     Status: None   Collection Time: 11/11/22  7:11 AM   Specimen: BLOOD LEFT HAND  Result Value Ref Range Status   Specimen Description BLOOD LEFT HAND  Final   Special Requests   Final    BOTTLES DRAWN AEROBIC ONLY Blood Culture adequate  volume   Culture   Final    NO GROWTH 5 DAYS Performed at [facility], [place] [address], [place], [place] [address]    Report Status 11/16/2022 FINAL  Final  Culture, blood (Routine X 2) w Reflex to ID Panel     Status: None   Collection Time: 11/11/22  7:20 AM   Specimen: BLOOD RIGHT HAND  Result Value Ref Range Status   Specimen Description BLOOD RIGHT HAND  Final   Special Requests   Final    BOTTLES DRAWN AEROBIC ONLY Blood Culture adequate volume   Culture   Final    NO GROWTH 5 DAYS Performed at [facility], [place] [address], [place], [place] [address]    Report Status 11/16/2022 FINAL  Final    Scheduled Meds:  albuterol  2.5 mg Nebulization Q6H   cycloSPORINE  1 drop Both Eyes BID   dextromethorphan-guaiFENesin  1 tablet Oral BID   docusate sodium  100 mg Oral Daily   DULoxetine  30 mg Oral Daily   enoxaparin (LOVENOX) injection  40 mg Subcutaneous Q24H   fluticasone  1 spray Each Nare Daily   gabapentin  300 mg Oral BID   mometasone-formoterol  2 puff Inhalation BID   oxyCODONE  20 mg Oral Q12H   pantoprazole  40 mg Oral Daily   psyllium  1 packet Oral Daily   senna-docusate  1 tablet Oral Q M,W,F-2000   Continuous Infusions:  sodium chloride 10 mL/hr at 11/16/22 0733   famotidine (PEPCID) IV 20 mg (11/16/22 1015)   meropenem (MERREM) IV 1 g (11/16/22 1504)    Principal Problem:   Bacteremia Active Problems:   Dyspnea   Infection due to ESBL-producing Escherichia coli   Acute pyelonephritis   Sepsis ([place])   LOS: 8 days   A & P  Sepsis ([place]) Sepsis resolved. Due to pyelonephritis secondary to ESBL E. Coli grown from urine and 2/2.The patient is receiving Meropenem. Blood cultures x 2 from 11/08/2022 also positive for ESBL E. Coli. Repeat blood cultures from 11/11/2022 has had no growth for 5 days.    Infection due to ESBL-producing Escherichia coli ESBL E. Coli grown from urine and 2/2.The patient is receiving Meropenem. Blood cultures x 2  from 11/08/2022 also positive for ESBL E. Coli. Repeat blood cultures from 11/11/2022 has had no growth for 5 days.  Bacteremia Blood cultures x 2 from 11/08/2022 also positive for ESBL E. Coli. Repeat blood cultures from 11/11/2022 has had no growth for 5 days. She is receiving Meropenem. Last day of IV antibiotics is 11/18/2022. Per pharmacy no need for conversion to oral antibiotics.  Acute pyelonephritis ESBL E. Coli grown from urine and 2/2.The patient is receiving Meropenem. Blood cultures x 2 from 11/08/2022 also positive for ESBL E. Coli. Repeat blood cultures from 11/11/2022 has had no growth for 5 days.   DVT prophylaxis: Lovenox Code Status: Full Code Family Communication: None available Disposition Plan: Home    Garland Hincapie, DO [facility] Direct contact: see [email or web address]  7PM-7AM contact night coverage as above 11/16/2022, 5:42 PM  LOS: 8 days

## 2022-11-17 DIAGNOSIS — R7881 Bacteremia: Secondary | ICD-10-CM | POA: Diagnosis not present

## 2022-11-17 LAB — CBC WITH DIFFERENTIAL/PLATELET
Abs Immature Granulocytes: 0 10*3/uL (ref 0.00–0.07)
Basophils Absolute: 0.1 10*3/uL (ref 0.0–0.1)
Basophils Relative: 1 %
Eosinophils Absolute: 0.1 10*3/uL (ref 0.0–0.5)
Eosinophils Relative: 1 %
HCT: 30.6 % — ABNORMAL LOW (ref 36.0–46.0)
Hemoglobin: 9.6 g/dL — ABNORMAL LOW (ref 12.0–15.0)
Lymphocytes Relative: 44 %
Lymphs Abs: 4.5 10*3/uL — ABNORMAL HIGH (ref 0.7–4.0)
MCH: 30.1 pg (ref 26.0–34.0)
MCHC: 31.4 g/dL (ref 30.0–36.0)
MCV: 95.9 fL (ref 80.0–100.0)
Monocytes Absolute: 0.8 10*3/uL (ref 0.1–1.0)
Monocytes Relative: 8 %
Neutro Abs: 4.7 10*3/uL (ref 1.7–7.7)
Neutrophils Relative %: 46 %
Platelets: 406 10*3/uL — ABNORMAL HIGH (ref 150–400)
RBC: 3.19 MIL/uL — ABNORMAL LOW (ref 3.87–5.11)
RDW: 16.3 % — ABNORMAL HIGH (ref 11.5–15.5)
WBC: 10.3 10*3/uL (ref 4.0–10.5)
nRBC: 0 % (ref 0.0–0.2)
nRBC: 0 /100 WBC

## 2022-11-17 LAB — BASIC METABOLIC PANEL
Anion gap: 6 (ref 5–15)
BUN: 5 mg/dL — ABNORMAL LOW (ref 8–23)
CO2: 30 mmol/L (ref 22–32)
Calcium: 7.8 mg/dL — ABNORMAL LOW (ref 8.9–10.3)
Chloride: 104 mmol/L (ref 98–111)
Creatinine, Ser: 0.43 mg/dL — ABNORMAL LOW (ref 0.44–1.00)
GFR, Estimated: >60 mL/min (ref >60)
Glucose, Bld: 100 mg/dL — ABNORMAL HIGH (ref 70–99)
Potassium: 3.5 mmol/L (ref 3.5–5.1)
Sodium: 140 mmol/L (ref 135–145)

## 2022-11-17 NOTE — Progress Notes (Signed)
PROGRESS NOTE  [name] [id number] DOB: 14-Jun-1951 DOA: 11/08/2022 PCP: [name], MD  Brief History   71 year old with medical history significant for rheumatoid arthritis, GERD, asthma, diverticulosis, obesity, sarcoidosis not on treatment presented with fever and weakness.  She was found to have sepsis secondary to UTI, ESBL E. coli bacteremia right-sided pyelonephritis. This was discussed with ID and recommendation was for Invanz for 10 days, last date of treatment 2/17.   Developed lip swelling on 2/13, she received IV Benadryl.  She received a dose of Invanz today.  I suspect she had allergic reaction  secondary to Invanz.  She has bilateral wheezing on exam today, and persistent lip swelling.  IV Benadryl 3 times daily for 24 hours ordered, IV Solu-Medrol, albuterol scheduled every 6 hours.   I have discussed with ID and we can change Invanz to meropenem.  Last day of IV antibiotics per pharmacy is 11/18/2022. No need to convert to oral antibiotics.  Consultants  None  Procedures  None  Antibiotics   Anti-infectives (From admission, onward)    Start     Dose/Rate Route Frequency Ordered Stop   11/16/22 0600  meropenem (MERREM) 1 g in sodium chloride 0.9 % 100 mL IVPB        1 g 200 mL/hr over 30 Minutes Intravenous Every 8 hours 11/15/22 1107 11/18/22 2159   11/12/22 1200  ertapenem (INVANZ) 1,000 mg in sodium chloride 0.9 % 100 mL IVPB  Status:  Discontinued       Note to Pharmacy: 10 day total course (2/11 is day #4 Meropenem)   1 g 200 mL/hr over 30 Minutes Intravenous Every 24 hours 11/12/22 1047 11/15/22 1033   11/09/22 2300  vancomycin (VANCOREADY) IVPB 750 mg/150 mL  Status:  Discontinued        750 mg 150 mL/hr over 60 Minutes Intravenous Every 24 hours 11/08/22 2151 11/12/22 1107   11/09/22 0715  meropenem (MERREM) 1 g in sodium chloride 0.9 % 100 mL IVPB  Status:  Discontinued        1 g 200 mL/hr over 30 Minutes Intravenous Every 8 hours 11/09/22  0616 11/12/22 1047   11/08/22 2300  ceFEPIme (MAXIPIME) 2 g in sodium chloride 0.9 % 100 mL IVPB  Status:  Discontinued        2 g 200 mL/hr over 30 Minutes Intravenous Every 12 hours 11/08/22 2123 11/09/22 0616   11/08/22 2130  vancomycin (VANCOREADY) IVPB 1750 mg/350 mL        1,750 mg 175 mL/hr over 120 Minutes Intravenous  Once 11/08/22 2123 11/09/22 0047   11/08/22 1845  cefTRIAXone (ROCEPHIN) 2 g in sodium chloride 0.9 % 100 mL IVPB        2 g 200 mL/hr over 30 Minutes Intravenous  Once 11/08/22 1838 11/08/22 1943        Subjective  The patient is resting comfortably. No new complaints.   Objective   Vitals:  Vitals:   11/16/22 0857 11/16/22 1611  BP: (!) 147/74 (!) 145/68  Pulse: 99 (!) 105  Resp: 17 19  Temp: 97.7 F (36.5 C) 97.6 F (36.4 C)  SpO2: 99% 96%    Exam:  Constitutional:  Appears calm and comfortable. No acute distress. Respiratory:  CTA bilaterally, no w/r/r.  Respiratory effort normal. No retractions or accessory muscle use Cardiovascular:  RRR, no m/r/g No LE extremity edema   Normal pedal pulses Abdomen:  Abdomen appears normal; no tenderness or masses No hernias No  HSM Musculoskeletal:  Digits/nails BUE: no clubbing, cyanosis, petechiae, infection exam of joints, bones, muscles of at least one of following: head/neck, RUE, LUE, RLE, LLE   strength and tone normal, no atrophy, no abnormal movements No tenderness, masses Normal ROM, no contractures  gait and station Skin:  No rashes, lesions, ulcers palpation of skin: no induration or nodules Neurologic:  CN 2-12 intact Sensation all 4 extremities intact Psychiatric:  Mental status Mood, affect appropriate Orientation to person, place, time  judgment and insight appear intact     I have personally reviewed the following:   Today's Data   Vitals:   11/16/22 0857 11/16/22 1611  BP: (!) 147/74 (!) 145/68  Pulse: 99 (!) 105  Resp: 17 19  Temp: 97.7 F (36.5 C) 97.6 F  (36.4 C)  SpO2: 99% 96%     Lab Data  CBC    Component Value Date/Time   WBC 7.2 11/15/2022 0045   RBC 3.35 (L) 11/15/2022 0045   HGB 10.3 (L) 11/15/2022 0045   HCT 32.3 (L) 11/15/2022 0045   PLT 299 11/15/2022 0045   MCV 96.4 11/15/2022 0045   MCH 30.7 11/15/2022 0045   MCHC 31.9 11/15/2022 0045   RDW 16.5 (H) 11/15/2022 0045   LYMPHSABS 0.9 11/08/2022 1500   MONOABS 0.7 11/08/2022 1500   EOSABS 0.1 11/08/2022 1500   BASOSABS 0.0 11/08/2022 1500      Latest Ref Rng & Units 11/15/2022   12:45 AM 11/14/2022    2:28 AM 11/13/2022    3:09 AM  BMP  Glucose 70 - 99 mg/dL 104  102  107   BUN 8 - 23 mg/dL 6  6  7   $ Creatinine 0.44 - 1.00 mg/dL 0.55  0.51  0.55   Sodium 135 - 145 mmol/L 139  139  138   Potassium 3.5 - 5.1 mmol/L 3.7  3.6  3.9   Chloride 98 - 111 mmol/L 102  102  101   CO2 22 - 32 mmol/L 28  30  26   $ Calcium 8.9 - 10.3 mg/dL 7.8  8.1  8.2      Micro Data   Results for orders placed or performed during the hospital encounter of 11/08/22  Culture, blood (Routine x 2)     Status: Abnormal   Collection Time: 11/08/22  3:07 PM   Specimen: BLOOD RIGHT FOREARM  Result Value Ref Range Status   Specimen Description BLOOD RIGHT FOREARM  Final   Special Requests   Final    BOTTLES DRAWN AEROBIC AND ANAEROBIC Blood Culture adequate volume   Culture  Setup Time   Final    GRAM NEGATIVE RODS IN BOTH AEROBIC AND ANAEROBIC BOTTLES CRITICAL RESULT CALLED TO, READ BACK BY AND VERIFIED WITH: PHARMD G. LEDFORD 11/09/22 @ 0602 BY AB Performed at [facility], [place]. [address] [place], [place] [address]    Culture (A)  Final    ESCHERICHIA COLI Confirmed Extended Spectrum Beta-Lactamase Producer (ESBL).  In bloodstream infections from ESBL organisms, carbapenems are preferred over piperacillin/tazobactam. They are shown to have a lower risk of mortality.    Report Status 11/11/2022 FINAL  Final   Organism ID, Bacteria ESCHERICHIA COLI  Final      Susceptibility    Escherichia coli - MIC*    AMPICILLIN >=32 RESISTANT Resistant     CEFAZOLIN >=64 RESISTANT Resistant     CEFEPIME >=32 RESISTANT Resistant     CEFTAZIDIME >=64 RESISTANT Resistant  CEFTRIAXONE >=64 RESISTANT Resistant     CIPROFLOXACIN >=4 RESISTANT Resistant     GENTAMICIN <=1 SENSITIVE Sensitive     IMIPENEM <=0.25 SENSITIVE Sensitive     TRIMETH/SULFA >=320 RESISTANT Resistant     AMPICILLIN/SULBACTAM >=32 RESISTANT Resistant     PIP/TAZO 8 SENSITIVE Sensitive     * ESCHERICHIA COLI  Blood Culture ID Panel (Reflexed)     Status: Abnormal   Collection Time: 11/08/22  3:07 PM  Result Value Ref Range Status   Enterococcus faecalis NOT DETECTED NOT DETECTED Final   Enterococcus Faecium NOT DETECTED NOT DETECTED Final   Listeria monocytogenes NOT DETECTED NOT DETECTED Final   Staphylococcus species NOT DETECTED NOT DETECTED Final   Staphylococcus aureus (BCID) NOT DETECTED NOT DETECTED Final   Staphylococcus epidermidis NOT DETECTED NOT DETECTED Final   Staphylococcus lugdunensis NOT DETECTED NOT DETECTED Final   Streptococcus species NOT DETECTED NOT DETECTED Final   Streptococcus agalactiae NOT DETECTED NOT DETECTED Final   Streptococcus pneumoniae NOT DETECTED NOT DETECTED Final   Streptococcus pyogenes NOT DETECTED NOT DETECTED Final   A.calcoaceticus-baumannii NOT DETECTED NOT DETECTED Final   Bacteroides fragilis NOT DETECTED NOT DETECTED Final   Enterobacterales DETECTED (A) NOT DETECTED Final    Comment: Enterobacterales represent a large order of gram negative bacteria, not a single organism. CRITICAL RESULT CALLED TO, READ BACK BY AND VERIFIED WITH: PHARMD G. LEDFORD 11/09/22 @ 0602 BY AB    Enterobacter cloacae complex NOT DETECTED NOT DETECTED Final   Escherichia coli DETECTED (A) NOT DETECTED Final    Comment: CRITICAL RESULT CALLED TO, READ BACK BY AND VERIFIED WITH: PHARMD G. LEDFORD 11/09/22 @ 0602 BY AB    Klebsiella aerogenes NOT DETECTED NOT DETECTED Final    Klebsiella oxytoca NOT DETECTED NOT DETECTED Final   Klebsiella pneumoniae NOT DETECTED NOT DETECTED Final   Proteus species NOT DETECTED NOT DETECTED Final   Salmonella species NOT DETECTED NOT DETECTED Final   Serratia marcescens NOT DETECTED NOT DETECTED Final   Haemophilus influenzae NOT DETECTED NOT DETECTED Final   Neisseria meningitidis NOT DETECTED NOT DETECTED Final   Pseudomonas aeruginosa NOT DETECTED NOT DETECTED Final   Stenotrophomonas maltophilia NOT DETECTED NOT DETECTED Final   Candida albicans NOT DETECTED NOT DETECTED Final   Candida auris NOT DETECTED NOT DETECTED Final   Candida glabrata NOT DETECTED NOT DETECTED Final   Candida krusei NOT DETECTED NOT DETECTED Final   Candida parapsilosis NOT DETECTED NOT DETECTED Final   Candida tropicalis NOT DETECTED NOT DETECTED Final   Cryptococcus neoformans/gattii NOT DETECTED NOT DETECTED Final   CTX-M ESBL DETECTED (A) NOT DETECTED Final    Comment: CRITICAL RESULT CALLED TO, READ BACK BY AND VERIFIED WITH: PHARMD G. LEDFORD 11/09/22 @ 0602 BY AB (NOTE) Extended spectrum beta-lactamase detected. Recommend a carbapenem as initial therapy.      Carbapenem resistance IMP NOT DETECTED NOT DETECTED Final   Carbapenem resistance KPC NOT DETECTED NOT DETECTED Final   Carbapenem resistance NDM NOT DETECTED NOT DETECTED Final   Carbapenem resist OXA 48 LIKE NOT DETECTED NOT DETECTED Final   Carbapenem resistance VIM NOT DETECTED NOT DETECTED Final    Comment: Performed at [facility], [place] [address] [place], [place] [address]  Resp panel by RT-PCR (RSV, Flu A&B, Covid) Anterior Nasal Swab     Status: None   Collection Time: 11/08/22  3:12 PM   Specimen: Anterior Nasal Swab  Result Value Ref Range Status   SARS Coronavirus 2 by  RT PCR NEGATIVE NEGATIVE Final   Influenza A by PCR NEGATIVE NEGATIVE Final   Influenza B by PCR NEGATIVE NEGATIVE Final    Comment: (NOTE) The Xpert Xpress SARS-CoV-2/FLU/RSV plus assay is  intended as an aid in the diagnosis of influenza from Nasopharyngeal swab specimens and should not be used as a sole basis for treatment. Nasal washings and aspirates are unacceptable for Xpert Xpress SARS-CoV-2/FLU/RSV testing.  Fact Sheet for Patients: EntrepreneurPulse.com.au  Fact Sheet for Healthcare Providers: IncredibleEmployment.be  This test is not yet approved or cleared by the [place] FDA and has been authorized for detection and/or diagnosis of SARS-CoV-2 by FDA under an Emergency Use Authorization (EUA). This EUA will remain in effect (meaning this test can be used) for the duration of the COVID-19 declaration under Section 564(b)(1) of the Act, 21 U.S.C. section 360bbb-3(b)(1), unless the authorization is terminated or revoked.     Resp Syncytial Virus by PCR NEGATIVE NEGATIVE Final    Comment: (NOTE) Fact Sheet for Patients: EntrepreneurPulse.com.au  Fact Sheet for Healthcare Providers: IncredibleEmployment.be  This test is not yet approved or cleared by the [place] FDA and has been authorized for detection and/or diagnosis of SARS-CoV-2 by FDA under an Emergency Use Authorization (EUA). This EUA will remain in effect (meaning this test can be used) for the duration of the COVID-19 declaration under Section 564(b)(1) of the Act, 21 U.S.C. section 360bbb-3(b)(1), unless the authorization is terminated or revoked.  Performed at [facility], [place] [address] [place], [place] [address]   Culture, blood (Routine x 2)     Status: Abnormal   Collection Time: 11/08/22  6:38 PM   Specimen: BLOOD  Result Value Ref Range Status   Specimen Description BLOOD RIGHT ANTECUBITAL  Final   Special Requests   Final    BOTTLES DRAWN AEROBIC AND ANAEROBIC Blood Culture adequate volume   Culture  Setup Time   Final    GRAM NEGATIVE RODS IN BOTH AEROBIC AND ANAEROBIC BOTTLES CRITICAL  VALUE NOTED.  VALUE IS CONSISTENT WITH PREVIOUSLY REPORTED AND CALLED VALUE.    Culture (A)  Final    ESCHERICHIA COLI SUSCEPTIBILITIES PERFORMED ON PREVIOUS CULTURE WITHIN THE LAST 5 DAYS. Performed at [facility], [place] [address], [place], [place] [address]    Report Status 11/11/2022 FINAL  Final  Urine Culture (for pregnant, neutropenic or urologic patients or patients with an indwelling urinary catheter)     Status: Abnormal   Collection Time: 11/08/22  8:27 PM   Specimen: Urine, Clean Catch  Result Value Ref Range Status   Specimen Description URINE, CLEAN CATCH  Final   Special Requests   Final    NONE Performed at [facility], [place] [address] [place], [place] [address]    Culture (A)  Final    70,000 COLONIES/mL ESCHERICHIA COLI Confirmed Extended Spectrum Beta-Lactamase Producer (ESBL).  In bloodstream infections from ESBL organisms, carbapenems are preferred over piperacillin/tazobactam. They are shown to have a lower risk of mortality.    Report Status 11/10/2022 FINAL  Final   Organism ID, Bacteria ESCHERICHIA COLI (A)  Final      Susceptibility   Escherichia coli - MIC*    AMPICILLIN >=32 RESISTANT Resistant     CEFAZOLIN >=64 RESISTANT Resistant     CEFEPIME >=32 RESISTANT Resistant     CEFTRIAXONE >=64 RESISTANT Resistant     CIPROFLOXACIN >=4 RESISTANT Resistant     GENTAMICIN <=1 SENSITIVE Sensitive  IMIPENEM <=0.25 SENSITIVE Sensitive     NITROFURANTOIN <=16 SENSITIVE Sensitive     TRIMETH/SULFA >=320 RESISTANT Resistant     AMPICILLIN/SULBACTAM >=32 RESISTANT Resistant     PIP/TAZO 8 SENSITIVE Sensitive     * 70,000 COLONIES/mL ESCHERICHIA COLI  Urine Culture (for pregnant, neutropenic or urologic patients or patients with an indwelling urinary catheter)     Status: Abnormal   Collection Time: 11/08/22 11:30 PM   Specimen: Urine, Clean Catch  Result Value Ref Range Status   Specimen Description URINE, CLEAN CATCH  Final   Special Requests    Final    NONE Performed at [facility], [place] [address] [place], [place] [address]    Culture (A)  Final    50,000 COLONIES/mL ESCHERICHIA COLI Confirmed Extended Spectrum Beta-Lactamase Producer (ESBL).  In bloodstream infections from ESBL organisms, carbapenems are preferred over piperacillin/tazobactam. They are shown to have a lower risk of mortality.    Report Status 11/10/2022 FINAL  Final   Organism ID, Bacteria ESCHERICHIA COLI (A)  Final      Susceptibility   Escherichia coli - MIC*    AMPICILLIN >=32 RESISTANT Resistant     CEFAZOLIN >=64 RESISTANT Resistant     CEFEPIME >=32 RESISTANT Resistant     CEFTRIAXONE >=64 RESISTANT Resistant     CIPROFLOXACIN >=4 RESISTANT Resistant     GENTAMICIN <=1 SENSITIVE Sensitive     IMIPENEM <=0.25 SENSITIVE Sensitive     NITROFURANTOIN <=16 SENSITIVE Sensitive     TRIMETH/SULFA >=320 RESISTANT Resistant     AMPICILLIN/SULBACTAM >=32 RESISTANT Resistant     PIP/TAZO 8 SENSITIVE Sensitive     * 50,000 COLONIES/mL ESCHERICHIA COLI  MRSA Next Gen by PCR, Nasal     Status: None   Collection Time: 11/08/22 11:30 PM   Specimen: Nasal Mucosa; Nasal Swab  Result Value Ref Range Status   MRSA by PCR Next Gen NOT DETECTED NOT DETECTED Final    Comment: (NOTE) The GeneXpert MRSA Assay (FDA approved for NASAL specimens only), is one component of a comprehensive MRSA colonization surveillance program. It is not intended to diagnose MRSA infection nor to guide or monitor treatment for MRSA infections. Test performance is not FDA approved in patients less than 72 years old. Performed at [facility], [place] [address] [place], [place] [address]   Culture, blood (Routine X 2) w Reflex to ID Panel     Status: None   Collection Time: 11/11/22  7:11 AM   Specimen: BLOOD LEFT HAND  Result Value Ref Range Status   Specimen Description BLOOD LEFT HAND  Final   Special Requests   Final    BOTTLES DRAWN AEROBIC ONLY Blood Culture adequate  volume   Culture   Final    NO GROWTH 5 DAYS Performed at [facility], [place] [address], [place], [place] [address]    Report Status 11/16/2022 FINAL  Final  Culture, blood (Routine X 2) w Reflex to ID Panel     Status: None   Collection Time: 11/11/22  7:20 AM   Specimen: BLOOD RIGHT HAND  Result Value Ref Range Status   Specimen Description BLOOD RIGHT HAND  Final   Special Requests   Final    BOTTLES DRAWN AEROBIC ONLY Blood Culture adequate volume   Culture   Final    NO GROWTH 5 DAYS Performed at [facility], [place] [address], [place], [place] [address]    Report Status 11/16/2022 FINAL  Final    Scheduled Meds:  albuterol  2.5 mg Nebulization Q6H   cycloSPORINE  1 drop Both Eyes BID   dextromethorphan-guaiFENesin  1 tablet Oral BID   docusate sodium  100 mg Oral Daily   DULoxetine  30 mg Oral Daily   enoxaparin (LOVENOX) injection  40 mg Subcutaneous Q24H   fluticasone  1 spray Each Nare Daily   gabapentin  300 mg Oral BID   mometasone-formoterol  2 puff Inhalation BID   oxyCODONE  20 mg Oral Q12H   pantoprazole  40 mg Oral Daily   psyllium  1 packet Oral Daily   senna-docusate  1 tablet Oral Q M,W,F-2000   Continuous Infusions:  sodium chloride 10 mL/hr at 11/16/22 0733   famotidine (PEPCID) IV 20 mg (11/16/22 1015)   meropenem (MERREM) IV 1 g (11/16/22 1504)    Principal Problem:   Bacteremia Active Problems:   Dyspnea   Infection due to ESBL-producing Escherichia coli   Acute pyelonephritis   Sepsis ([place])   LOS: 8 days   A & P  Sepsis ([place]) Sepsis resolved. Due to pyelonephritis secondary to ESBL E. Coli grown from urine and 2/2.The patient is receiving Meropenem. Blood cultures x 2 from 11/08/2022 also positive for ESBL E. Coli. Repeat blood cultures from 11/11/2022 has had no growth for 5 days.    Infection due to ESBL-producing Escherichia coli ESBL E. Coli grown from urine and 2/2.The patient is receiving Meropenem. Blood cultures x 2  from 11/08/2022 also positive for ESBL E. Coli. Repeat blood cultures from 11/11/2022 has had no growth for 5 days.  Bacteremia Blood cultures x 2 from 11/08/2022 also positive for ESBL E. Coli. Repeat blood cultures from 11/11/2022 has had no growth for 5 days. She is receiving Meropenem. Last day of IV antibiotics is 11/18/2022. Per pharmacy no need for conversion to oral antibiotics.  Acute pyelonephritis ESBL E. Coli grown from urine and 2/2.The patient is receiving Meropenem. Blood cultures x 2 from 11/08/2022 also positive for ESBL E. Coli. Repeat blood cultures from 11/11/2022 has had no growth for 5 days.   DVT prophylaxis: Lovenox Code Status: Full Code Family Communication: None available Disposition Plan: Home    Natoya Viscomi, DO [facility] Direct contact: see [email or web address]  7PM-7AM contact night coverage as above 11/17/2022, 5:09 PM  LOS: 8 days

## 2022-11-17 NOTE — Progress Notes (Signed)
Physical Therapy Treatment Patient Details Name: [name] MRN: [id number] DOB: [birth year] Today's Date: 11/17/2022   History of Present Illness 72 y.o. female admitted 2/7 for evaluation and treatment of sepsis secondary to suspected UTI. PMHx: rheumatoid arthritis, GERD, asthma, diverticulosis, obesity, sarcoidosis not on treatment.    PT Comments    Tolerated treatment well. Gait training with RW, supervision level for safety without LOB or buckling today. SpO2 98% DOE 2/4, HR 133 max. Reviewed transfer training with several variations until safely leaning forward to rise up without pressing back of legs into bed to rise. Patient will continue to benefit from skilled physical therapy services to further improve independence with functional mobility.    Recommendations for follow up therapy are one component of a multi-disciplinary discharge planning process, led by the attending physician.  Recommendations may be updated based on patient status, additional functional criteria and insurance authorization.  Follow Up Recommendations  Home health PT (At ALF - Wishes to return to [facility])     Assistance Recommended at Discharge Intermittent Supervision/Assistance  Patient can return home with the following A little help with walking and/or transfers;A little help with bathing/dressing/bathroom;Assistance with cooking/housework;Assist for transportation;Help with stairs or ramp for entrance   Equipment Recommendations  None recommended by PT    Recommendations for Other Services       Precautions / Restrictions Precautions Precautions: Fall Restrictions Weight Bearing Restrictions: No     Mobility  Bed Mobility Overal bed mobility: Modified Independent             General bed mobility comments: No assist required    Transfers Overall transfer level: Needs assistance Equipment used: Rolling walker (2 wheels) (Prior notes error - pt has been using RW  (incorrectly carried over)) Transfers: Sit to/from Stand Sit to Stand: Min guard           General transfer comment: Min guard for safety. Tried a couple of different techniques until pt showed safe transition to stand. Reinforced forward lean over RW to rise which greatly improved efficency and balance. Leans too far posteriorly initially bracing LEs on bed.    Ambulation/Gait Ambulation/Gait assistance: Supervision Gait Distance (Feet): 100 Feet Assistive device: Rolling walker (2 wheels) Gait Pattern/deviations: Step-through pattern, Drifts right/left, Wide base of support Gait velocity: slow Gait velocity interpretation: <1.31 ft/sec, indicative of household ambulator   General Gait Details: Minor wheezing noted, HR to 133 at max (improved from 140s prior sessions.) SpO2 98% on RA. Cues for symmetry of gait, continuous steps and progression of RW, and awarenesss of slight Rtward drift. no buckling or LOB. Still quite slow but improving.   Stairs             Wheelchair Mobility    Modified Rankin (Stroke Patients Only)       Balance Overall balance assessment: Needs assistance Sitting-balance support: No upper extremity supported, Feet supported Sitting balance-Leahy Scale: Good     Standing balance support: During functional activity, No upper extremity supported Standing balance-Leahy Scale: Fair                              Cognition Arousal/Alertness: Awake/alert Behavior During Therapy: WFL for tasks assessed/performed Overall Cognitive Status: Within Functional Limits for tasks assessed  Exercises General Exercises - Lower Extremity Ankle Circles/Pumps: Strengthening, Both, 10 reps, Supine Quad Sets: Strengthening, Both, 10 reps, Supine Gluteal Sets: Strengthening, Both, 10 reps, Supine Heel Slides: Strengthening, Both, 10 reps, Seated Hip ABduction/ADduction: Strengthening, Both,  10 reps, Seated    General Comments General comments (skin integrity, edema, etc.): HR 133 max, SpO2 98% on RA. 2/4 dyspnea      Pertinent Vitals/Pain Pain Assessment Pain Assessment: No/denies pain    Home Living                          Prior Function            PT Goals (current goals can now be found in the care plan section) Acute Rehab PT Goals Patient Stated Goal: Go back to ALF PT Goal Formulation: With patient Time For Goal Achievement: 11/24/22 Potential to Achieve Goals: Good Progress towards PT goals: Progressing toward goals    Frequency    Min 3X/week      PT Plan Current plan remains appropriate    Co-evaluation              AM-PAC PT "6 Clicks" Mobility   Outcome Measure  Help needed turning from your back to your side while in a flat bed without using bedrails?: None Help needed moving from lying on your back to sitting on the side of a flat bed without using bedrails?: None Help needed moving to and from a bed to a chair (including a wheelchair)?: A Little Help needed standing up from a chair using your arms (e.g., wheelchair or bedside chair)?: A Little Help needed to walk in hospital room?: A Little Help needed climbing 3-5 steps with a railing? : A Lot 6 Click Score: 19    End of Session Equipment Utilized During Treatment: Gait belt Activity Tolerance: Patient tolerated treatment well Patient left: in chair;with call bell/phone within reach;with chair alarm set Nurse Communication: Mobility status PT Visit Diagnosis: Unsteadiness on feet (R26.81);Other abnormalities of gait and mobility (R26.89);Muscle weakness (generalized) (M62.81);Difficulty in walking, not elsewhere classified (R26.2)     Time: [id number] PT Time Calculation (min) (ACUTE ONLY): 21 min  Charges:  $Gait Training: 8-22 mins                     [name], PT, DPT Physical Therapist Acute Rehabilitation Services [place]    [name] 11/17/2022, 10:56 AM

## 2022-11-18 DIAGNOSIS — R7881 Bacteremia: Secondary | ICD-10-CM | POA: Diagnosis not present

## 2022-11-18 LAB — CBC WITH DIFFERENTIAL/PLATELET
Abs Immature Granulocytes: 0.04 10*3/uL (ref 0.00–0.07)
Basophils Absolute: 0.1 10*3/uL (ref 0.0–0.1)
Basophils Relative: 1 %
Eosinophils Absolute: 0.5 10*3/uL (ref 0.0–0.5)
Eosinophils Relative: 5 %
HCT: 33.1 % — ABNORMAL LOW (ref 36.0–46.0)
Hemoglobin: 9.9 g/dL — ABNORMAL LOW (ref 12.0–15.0)
Immature Granulocytes: 0 %
Lymphocytes Relative: 36 %
Lymphs Abs: 3.4 10*3/uL (ref 0.7–4.0)
MCH: 29.3 pg (ref 26.0–34.0)
MCHC: 29.9 g/dL — ABNORMAL LOW (ref 30.0–36.0)
MCV: 97.9 fL (ref 80.0–100.0)
Monocytes Absolute: 0.9 10*3/uL (ref 0.1–1.0)
Monocytes Relative: 10 %
Neutro Abs: 4.6 10*3/uL (ref 1.7–7.7)
Neutrophils Relative %: 48 %
Platelets: 411 10*3/uL — ABNORMAL HIGH (ref 150–400)
RBC: 3.38 MIL/uL — ABNORMAL LOW (ref 3.87–5.11)
RDW: 16.5 % — ABNORMAL HIGH (ref 11.5–15.5)
WBC: 9.5 10*3/uL (ref 4.0–10.5)
nRBC: 0 % (ref 0.0–0.2)

## 2022-11-18 LAB — BASIC METABOLIC PANEL
Anion gap: 9 (ref 5–15)
BUN: 5 mg/dL — ABNORMAL LOW (ref 8–23)
CO2: 27 mmol/L (ref 22–32)
Calcium: 7.8 mg/dL — ABNORMAL LOW (ref 8.9–10.3)
Chloride: 106 mmol/L (ref 98–111)
Creatinine, Ser: 0.48 mg/dL (ref 0.44–1.00)
GFR, Estimated: >60 mL/min (ref >60)
Glucose, Bld: 115 mg/dL — ABNORMAL HIGH (ref 70–99)
Potassium: 3.6 mmol/L (ref 3.5–5.1)
Sodium: 142 mmol/L (ref 135–145)

## 2022-11-18 MED ORDER — DULOXETINE HCL 30 MG PO CPEP: 30.0000 mg | ORAL_CAPSULE | Freq: Every day | ORAL | 1 refills | Status: AC

## 2022-11-18 MED ORDER — PSYLLIUM 95 % PO PACK: 1.0000 | PACK | Freq: Every day | ORAL | 0 refills | Status: AC

## 2022-11-18 MED ORDER — PANTOPRAZOLE SODIUM 40 MG PO TBEC: 40.0000 mg | DELAYED_RELEASE_TABLET | Freq: Every day | ORAL | 0 refills | Status: DC

## 2022-11-18 MED ORDER — ALBUTEROL SULFATE (2.5 MG/3ML) 0.083% IN NEBU: 2.5000 mg | INHALATION_SOLUTION | Freq: Two times a day (BID) | RESPIRATORY_TRACT | Status: DC

## 2022-11-18 MED ORDER — FAMOTIDINE 20 MG PO TABS
20.0000 mg | ORAL_TABLET | Freq: Two times a day (BID) | ORAL | Status: DC
  Administered 2022-11-18: 20 mg via ORAL
  Filled 2022-11-18: qty 1

## 2022-11-18 NOTE — Discharge Summary (Addendum)
Physician Discharge Summary  Patient ID: [name] MRN: [id number] DOB/AGE: 1950/10/03 72 y.o.  Admit date: 11/08/2022 Discharge date: 11/18/2022  Admission Diagnoses:  Discharge Diagnoses:  Principal Problem:   Bacteremia Active Problems:   Dyspnea   Infection due to ESBL-producing Escherichia coli   Acute pyelonephritis   Sepsis ([place]) Acute metabolic encephalopathy, resolved   Discharged Condition: stable  Hospital Course: Patient is a 72 year old female with past medical history significant for rheumatoid arthritis, GERD, asthma, diverticulosis, obesity, sarcoidosis not on treatment presented with fever and weakness.  Patient was found to have sepsis secondary to UTI, ESBL E. coli bacteremia right-sided pyelonephritis.  Patient developed lip swelling on 11/14/2022, that was managed with IV Benadryl and steroid.  [name] was changed to IV meropenem to complete course of antibiotics.  Patient be discharged back onto the care of the primary care provider.    Sepsis (HCC)/UTI/Pyelonephritis: Sepsis resolved. Blood culture grew ESBL E. coli.   Infection due to ESBL-producing Escherichia coli ESBL E. Coli grown from urine and 2/2.The patient is receiving Meropenem. Blood cultures x 2 from 11/08/2022 also positive for ESBL E. Coli. Repeat blood cultures from 11/11/2022 has had no growth for 5 days.   Bacteremia Blood cultures x 2 from 11/08/2022 also positive for ESBL E. Coli. Repeat blood cultures from 11/11/2022 has had no growth for 5 days. She is receiving Meropenem. Last day of IV antibiotics is 11/18/2022. Per pharmacy no need for conversion to oral antibiotics.   Acute pyelonephritis ESBL E. Coli grown from urine and 2/2.The patient is receiving Meropenem. Blood cultures x 2 from 11/08/2022 also positive for ESBL E. Coli. Repeat blood cultures from 11/11/2022 has had no growth for 5 days.     Consults:  Case was discussed infectious disease team over the phone.  Significant  Diagnostic Studies: Blood culture grew E. coli ESBL.  Treatments: Patient was treated with IV Invanz, and was later changed to IV meropenem.  Apparently, patient developed allergic reaction whilst on IV Invanz.  Patient also endorsed swelling of the lips.  Discharge Exam: Blood pressure (!) 146/85, pulse 92, temperature 98.3 F (36.8 C), temperature source Oral, resp. rate 18, height 5' (1.524 m), weight 87.2 kg, SpO2 96 %.   Disposition: Discharge disposition: 01-Home or Self Care       Discharge Instructions     Diet - low sodium heart healthy   Complete by: As directed    Increase activity slowly   Complete by: As directed       Allergies as of 11/18/2022       Reactions   Ibuprofen Rash   Sulfa Antibiotics Rash        Medication List     STOP taking these medications    Allegra-D Allergy & Congestion 60-120 MG 12 hr tablet Generic drug: fexofenadine-pseudoephedrine   Biofreeze 4 % Gel Generic drug: Menthol (Topical Analgesic)   guaiFENesin-dextromethorphan 100-10 MG/5ML syrup Commonly known as: ROBITUSSIN DM   loperamide 2 MG capsule Commonly known as: IMODIUM   METAMUCIL PO Replaced by: psyllium 95 % Pack   omeprazole 20 MG capsule Commonly known as: PRILOSEC Replaced by: pantoprazole 40 MG tablet   Oxycodone HCl 10 MG Tabs   rivaroxaban 10 MG Tabs tablet Commonly known as: XARELTO   sodium chloride 0.65 % nasal spray Commonly known as: OCEAN       TAKE these medications    acetaminophen 500 MG tablet Commonly known as: TYLENOL Take 1,000 mg by mouth in the  morning and at bedtime.   albuterol 108 (90 Base) MCG/ACT inhaler Commonly known as: VENTOLIN HFA TAKE 2 PUFFS BY MOUTH EVERY 6 HOURS AS NEEDED What changed:  how much to take how to take this when to take this reasons to take this   alendronate 70 MG tablet Commonly known as: FOSAMAX Take 70 mg by mouth every Monday.   amoxicillin 500 MG capsule Commonly known as:  AMOXIL Take 2,000 mg by mouth See admin instructions. Take 2000mg  by mouth 1 hour before appt.   cyanocobalamin 1000 MCG/ML injection Commonly known as: VITAMIN B12 Inject 1,000 mcg into the muscle every 30 (thirty) days.   cycloSPORINE 0.05 % ophthalmic emulsion Commonly known as: RESTASIS Place 1 drop into both eyes 2 (two) times daily.   DULoxetine 30 MG capsule Commonly known as: CYMBALTA Take 1 capsule (30 mg total) by mouth daily. Start taking on: November 19, 2022 What changed: Another medication with the same name was removed. Continue taking this medication, and follow the directions you see here.   fluticasone 50 MCG/ACT nasal spray Commonly known as: FLONASE Place 1 spray into both nostrils daily.   folic acid 1 MG tablet Commonly known as: FOLVITE Take 1 mg by mouth daily.   gabapentin 300 MG capsule Commonly known as: NEURONTIN Take 300 mg by mouth 2 (two) times daily.   Humira (2 Pen) 40 MG/0.8ML Pnkt Generic drug: Adalimumab Inject into the skin See admin instructions. Give every two weeks. Will be administered by home health.   HYDROcodone-acetaminophen 5-325 MG tablet Commonly known as: NORCO/VICODIN Take 1 tablet by mouth every 8 (eight) hours as needed for moderate pain or severe pain.   levocetirizine 5 MG tablet Commonly known as: Xyzal Allergy 24HR Take 1 tablet (5 mg total) by mouth every evening.   methotrexate 2.5 MG tablet Commonly known as: RHEUMATREX Take 20 mg by mouth every Wednesday.   pantoprazole 40 MG tablet Commonly known as: PROTONIX Take 1 tablet (40 mg total) by mouth daily. Start taking on: November 19, 2022 Replaces: omeprazole 20 MG capsule   polyethylene glycol powder 17 GM/SCOOP powder Commonly known as: GLYCOLAX/MIRALAX Take 17 g by mouth daily as needed.   predniSONE 5 MG tablet Commonly known as: DELTASONE Take 5 mg by mouth daily.   psyllium 95 % Pack Commonly known as: HYDROCIL/METAMUCIL Take 1 packet by  mouth daily. Start taking on: November 19, 2022 Replaces: METAMUCIL PO   Vitamin D (Ergocalciferol) 1.25 MG (50000 UNIT) Caps capsule Commonly known as: DRISDOL Take 1 capsule (50,000 Units total) by mouth every 7 (seven) days. What changed: when to take this   Wixela Inhub 250-50 MCG/ACT Aepb Generic drug: fluticasone-salmeterol INHALE 1 PUFF BY MOUTH TWICE A DAY What changed: See the new instructions.         SignedBonnell Public 11/18/2022, 11:59 AM

## 2022-11-18 NOTE — TOC Transition Note (Signed)
Transition of Care [facility]) - CM/SW Discharge Note   Patient Details  Name: [name] MRN: [id number] Date of Birth: 1951-04-23  Transition of [facility] CM/SW Contact:  Amador Cunas, [place] Phone Number: 11/18/2022, 1:45 PM   Clinical Narrative:  Pt for dc back to [facility] today. Pt aware of dc and reports agreeable. Spoke to Hamlin, at [facility] who confirmed pt able to return. Germain Osgood requested dc summary be sent back with pt and declined offer to fax paperwork prior to dc. RN provided with number for report and [facility] arranged for transport (requested 2:30pm pick up). SW signing off at dc.   Wandra Feinstein, MSW, LCSW [phone] (coverage)       Final next level of care: Assisted Living Barriers to Discharge: No Barriers Identified   Patient Goals and CMS Choice      Discharge Placement                  Patient to be transferred to facility by: [place] Name of family member notified: pt to update family Patient and family notified of of transfer: 11/18/22  Discharge Plan and Services Additional resources added to the After Visit Summary for   In-house Referral: Clinical Social Work Discharge Planning Services: CM Consult                                 Social Determinants of Health (SDOH) Interventions SDOH Screenings   Food Insecurity: No Food Insecurity (11/09/2022)  Housing: Low Risk  (11/09/2022)  Transportation Needs: No Transportation Needs (11/09/2022)  Utilities: Not At Risk (11/09/2022)  Alcohol Screen: Low Risk  (07/20/2022)  Depression (PHQ2-9): Low Risk  (07/20/2022)  Financial Resource Strain: Low Risk  (07/20/2022)  Physical Activity: Sufficiently Active (07/20/2022)  Social Connections: Moderately Integrated (07/20/2022)  Stress: Stress Concern Present (07/20/2022)  Tobacco Use: Low Risk  (11/10/2022)     Readmission Risk Interventions     No data to display

## 2022-11-18 NOTE — Progress Notes (Signed)
AVS given and reviewed with pt. Medications discussed. All questions answered to satisfaction. Pt verbalized understanding of information given. Josie LCSW confirmed transport has been set up and to transport pt with all belongings to [facility].

## 2022-11-18 NOTE — Progress Notes (Signed)
DISCHARGE NOTE [name] to be discharged Skilled nursing facility per MD order. Patient verbalized understanding.  Skin clean, dry and intact without evidence of skin break down, no evidence of skin tears noted. IV catheter discontinued intact. Site without signs and symptoms of complications. Dressing and pressure applied. Pt denies pain at the site currently. No complaints noted.  Patient free of lines, drains, and wounds.   Discharge packet assembled. An After Visit Summary (AVS) was printed and given to the EMS personnel. Patient escorted via stretcher and discharged to [facility] via ambulance. Report called to accepting facility; all questions and concerns addressed.   Keenan Bachelor, RN

## 2022-11-18 NOTE — Progress Notes (Incomplete)
PROGRESS NOTE  [name] [id number] DOB: Dec 03, 1950 DOA: 11/08/2022 PCP: [name], MD  Brief History   72 year old with medical history significant for rheumatoid arthritis, GERD, asthma, diverticulosis, obesity, sarcoidosis not on treatment presented with fever and weakness.  She was found to have sepsis secondary to UTI, ESBL E. coli bacteremia right-sided pyelonephritis. This was discussed with ID and recommendation was for Invanz for 10 days, last date of treatment 2/17.   Developed lip swelling on 2/13, she received IV Benadryl.  She received a dose of Invanz today.  I suspect she had allergic reaction  secondary to Invanz.  She has bilateral wheezing on exam today, and persistent lip swelling.  IV Benadryl 3 times daily for 24 hours ordered, IV Solu-Medrol, albuterol scheduled every 6 hours.   I have discussed with ID and we can change Invanz to meropenem.  Last day of IV antibiotics per pharmacy is 11/18/2022. No need to convert to oral antibiotics.  Consultants  None  Procedures  None  Antibiotics   Anti-infectives (From admission, onward)    Start     Dose/Rate Route Frequency Ordered Stop   11/16/22 0600  meropenem (MERREM) 1 g in sodium chloride 0.9 % 100 mL IVPB        1 g 200 mL/hr over 30 Minutes Intravenous Every 8 hours 11/15/22 1107 11/18/22 2159   11/12/22 1200  ertapenem (INVANZ) 1,000 mg in sodium chloride 0.9 % 100 mL IVPB  Status:  Discontinued       Note to Pharmacy: 10 day total course (2/11 is day #4 Meropenem)   1 g 200 mL/hr over 30 Minutes Intravenous Every 24 hours 11/12/22 1047 11/15/22 1033   11/09/22 2300  vancomycin (VANCOREADY) IVPB 750 mg/150 mL  Status:  Discontinued        750 mg 150 mL/hr over 60 Minutes Intravenous Every 24 hours 11/08/22 2151 11/12/22 1107   11/09/22 0715  meropenem (MERREM) 1 g in sodium chloride 0.9 % 100 mL IVPB  Status:  Discontinued        1 g 200 mL/hr over 30 Minutes Intravenous Every 8 hours 11/09/22  0616 11/12/22 1047   11/08/22 2300  ceFEPIme (MAXIPIME) 2 g in sodium chloride 0.9 % 100 mL IVPB  Status:  Discontinued        2 g 200 mL/hr over 30 Minutes Intravenous Every 12 hours 11/08/22 2123 11/09/22 0616   11/08/22 2130  vancomycin (VANCOREADY) IVPB 1750 mg/350 mL        1,750 mg 175 mL/hr over 120 Minutes Intravenous  Once 11/08/22 2123 11/09/22 0047   11/08/22 1845  cefTRIAXone (ROCEPHIN) 2 g in sodium chloride 0.9 % 100 mL IVPB        2 g 200 mL/hr over 30 Minutes Intravenous  Once 11/08/22 1838 11/08/22 1943        Subjective  The patient is resting comfortably. No new complaints.   Objective   Vitals:  Vitals:   11/18/22 0749 11/18/22 0856  BP:    Pulse:  92  Resp:    Temp:  98.3 F (36.8 C)  SpO2: 96% 96%    Exam:  Constitutional:  Appears calm and comfortable. No acute distress. Respiratory:  CTA bilaterally, no w/r/r.  Respiratory effort normal. No retractions or accessory muscle use Cardiovascular:  RRR, no m/r/g No LE extremity edema   Normal pedal pulses Abdomen:  Abdomen appears normal; no tenderness or masses No hernias No HSM Musculoskeletal:  Digits/nails BUE: no  clubbing, cyanosis, petechiae, infection exam of joints, bones, muscles of at least one of following: head/neck, RUE, LUE, RLE, LLE   strength and tone normal, no atrophy, no abnormal movements No tenderness, masses Normal ROM, no contractures  gait and station Skin:  No rashes, lesions, ulcers palpation of skin: no induration or nodules Neurologic:  CN 2-12 intact Sensation all 4 extremities intact Psychiatric:  Mental status Mood, affect appropriate Orientation to person, place, time  judgment and insight appear intact     I have personally reviewed the following:   Today's Data   Vitals:   11/18/22 0749 11/18/22 0856  BP:    Pulse:  92  Resp:    Temp:  98.3 F (36.8 C)  SpO2: 96% 96%     Lab Data  CBC    Component Value Date/Time   WBC 9.5  11/18/2022 0218   RBC 3.38 (L) 11/18/2022 0218   HGB 9.9 (L) 11/18/2022 0218   HCT 33.1 (L) 11/18/2022 0218   PLT 411 (H) 11/18/2022 0218   MCV 97.9 11/18/2022 0218   MCH 29.3 11/18/2022 0218   MCHC 29.9 (L) 11/18/2022 0218   RDW 16.5 (H) 11/18/2022 0218   LYMPHSABS 3.4 11/18/2022 0218   MONOABS 0.9 11/18/2022 0218   EOSABS 0.5 11/18/2022 0218   BASOSABS 0.1 11/18/2022 0218      Latest Ref Rng & Units 11/18/2022    2:18 AM 11/17/2022    2:15 AM 11/15/2022   12:45 AM  BMP  Glucose 70 - 99 mg/dL 115  100  104   BUN 8 - 23 mg/dL 5  5  6   $ Creatinine 0.44 - 1.00 mg/dL 0.48  0.43  0.55   Sodium 135 - 145 mmol/L 142  140  139   Potassium 3.5 - 5.1 mmol/L 3.6  3.5  3.7   Chloride 98 - 111 mmol/L 106  104  102   CO2 22 - 32 mmol/L 27  30  28   $ Calcium 8.9 - 10.3 mg/dL 7.8  7.8  7.8      Micro Data   Results for orders placed or performed during the hospital encounter of 11/08/22  Culture, blood (Routine x 2)     Status: Abnormal   Collection Time: 11/08/22  3:07 PM   Specimen: BLOOD RIGHT FOREARM  Result Value Ref Range Status   Specimen Description BLOOD RIGHT FOREARM  Final   Special Requests   Final    BOTTLES DRAWN AEROBIC AND ANAEROBIC Blood Culture adequate volume   Culture  Setup Time   Final    GRAM NEGATIVE RODS IN BOTH AEROBIC AND ANAEROBIC BOTTLES CRITICAL RESULT CALLED TO, READ BACK BY AND VERIFIED WITH: PHARMD G. LEDFORD 11/09/22 @ 0602 BY AB Performed at [facility], [place] [address] [place], [place] [address]    Culture (A)  Final    ESCHERICHIA COLI Confirmed Extended Spectrum Beta-Lactamase Producer (ESBL).  In bloodstream infections from ESBL organisms, carbapenems are preferred over piperacillin/tazobactam. They are shown to have a lower risk of mortality.    Report Status 11/11/2022 FINAL  Final   Organism ID, Bacteria ESCHERICHIA COLI  Final      Susceptibility   Escherichia coli - MIC*    AMPICILLIN >=32 RESISTANT Resistant     CEFAZOLIN >=64  RESISTANT Resistant     CEFEPIME >=32 RESISTANT Resistant     CEFTAZIDIME >=64 RESISTANT Resistant     CEFTRIAXONE >=64 RESISTANT Resistant     CIPROFLOXACIN >=  4 RESISTANT Resistant     GENTAMICIN <=1 SENSITIVE Sensitive     IMIPENEM <=0.25 SENSITIVE Sensitive     TRIMETH/SULFA >=320 RESISTANT Resistant     AMPICILLIN/SULBACTAM >=32 RESISTANT Resistant     PIP/TAZO 8 SENSITIVE Sensitive     * ESCHERICHIA COLI  Blood Culture ID Panel (Reflexed)     Status: Abnormal   Collection Time: 11/08/22  3:07 PM  Result Value Ref Range Status   Enterococcus faecalis NOT DETECTED NOT DETECTED Final   Enterococcus Faecium NOT DETECTED NOT DETECTED Final   Listeria monocytogenes NOT DETECTED NOT DETECTED Final   Staphylococcus species NOT DETECTED NOT DETECTED Final   Staphylococcus aureus (BCID) NOT DETECTED NOT DETECTED Final   Staphylococcus epidermidis NOT DETECTED NOT DETECTED Final   Staphylococcus lugdunensis NOT DETECTED NOT DETECTED Final   Streptococcus species NOT DETECTED NOT DETECTED Final   Streptococcus agalactiae NOT DETECTED NOT DETECTED Final   Streptococcus pneumoniae NOT DETECTED NOT DETECTED Final   Streptococcus pyogenes NOT DETECTED NOT DETECTED Final   A.calcoaceticus-baumannii NOT DETECTED NOT DETECTED Final   Bacteroides fragilis NOT DETECTED NOT DETECTED Final   Enterobacterales DETECTED (A) NOT DETECTED Final    Comment: Enterobacterales represent a large order of gram negative bacteria, not a single organism. CRITICAL RESULT CALLED TO, READ BACK BY AND VERIFIED WITH: PHARMD G. LEDFORD 11/09/22 @ 0602 BY AB    Enterobacter cloacae complex NOT DETECTED NOT DETECTED Final   Escherichia coli DETECTED (A) NOT DETECTED Final    Comment: CRITICAL RESULT CALLED TO, READ BACK BY AND VERIFIED WITH: PHARMD G. LEDFORD 11/09/22 @ 0602 BY AB    Klebsiella aerogenes NOT DETECTED NOT DETECTED Final   Klebsiella oxytoca NOT DETECTED NOT DETECTED Final   Klebsiella pneumoniae NOT  DETECTED NOT DETECTED Final   Proteus species NOT DETECTED NOT DETECTED Final   Salmonella species NOT DETECTED NOT DETECTED Final   Serratia marcescens NOT DETECTED NOT DETECTED Final   Haemophilus influenzae NOT DETECTED NOT DETECTED Final   Neisseria meningitidis NOT DETECTED NOT DETECTED Final   Pseudomonas aeruginosa NOT DETECTED NOT DETECTED Final   Stenotrophomonas maltophilia NOT DETECTED NOT DETECTED Final   Candida albicans NOT DETECTED NOT DETECTED Final   Candida auris NOT DETECTED NOT DETECTED Final   Candida glabrata NOT DETECTED NOT DETECTED Final   Candida krusei NOT DETECTED NOT DETECTED Final   Candida parapsilosis NOT DETECTED NOT DETECTED Final   Candida tropicalis NOT DETECTED NOT DETECTED Final   Cryptococcus neoformans/gattii NOT DETECTED NOT DETECTED Final   CTX-M ESBL DETECTED (A) NOT DETECTED Final    Comment: CRITICAL RESULT CALLED TO, READ BACK BY AND VERIFIED WITH: PHARMD G. LEDFORD 11/09/22 @ 0602 BY AB (NOTE) Extended spectrum beta-lactamase detected. Recommend a carbapenem as initial therapy.      Carbapenem resistance IMP NOT DETECTED NOT DETECTED Final   Carbapenem resistance KPC NOT DETECTED NOT DETECTED Final   Carbapenem resistance NDM NOT DETECTED NOT DETECTED Final   Carbapenem resist OXA 48 LIKE NOT DETECTED NOT DETECTED Final   Carbapenem resistance VIM NOT DETECTED NOT DETECTED Final    Comment: Performed at [facility], [place] [address], [place], [place] [address]  Resp panel by RT-PCR (RSV, Flu A&B, Covid) Anterior Nasal Swab     Status: None   Collection Time: 11/08/22  3:12 PM   Specimen: Anterior Nasal Swab  Result Value Ref Range Status   SARS Coronavirus 2 by RT PCR NEGATIVE NEGATIVE Final   Influenza A  by PCR NEGATIVE NEGATIVE Final   Influenza B by PCR NEGATIVE NEGATIVE Final    Comment: (NOTE) The Xpert Xpress SARS-CoV-2/FLU/RSV plus assay is intended as an aid in the diagnosis of influenza from Nasopharyngeal swab  specimens and should not be used as a sole basis for treatment. Nasal washings and aspirates are unacceptable for Xpert Xpress SARS-CoV-2/FLU/RSV testing.  Fact Sheet for Patients: EntrepreneurPulse.com.au  Fact Sheet for Healthcare Providers: IncredibleEmployment.be  This test is not yet approved or cleared by the [place] FDA and has been authorized for detection and/or diagnosis of SARS-CoV-2 by FDA under an Emergency Use Authorization (EUA). This EUA will remain in effect (meaning this test can be used) for the duration of the COVID-19 declaration under Section 564(b)(1) of the Act, 21 U.S.C. section 360bbb-3(b)(1), unless the authorization is terminated or revoked.     Resp Syncytial Virus by PCR NEGATIVE NEGATIVE Final    Comment: (NOTE) Fact Sheet for Patients: EntrepreneurPulse.com.au  Fact Sheet for Healthcare Providers: IncredibleEmployment.be  This test is not yet approved or cleared by the [place] FDA and has been authorized for detection and/or diagnosis of SARS-CoV-2 by FDA under an Emergency Use Authorization (EUA). This EUA will remain in effect (meaning this test can be used) for the duration of the COVID-19 declaration under Section 564(b)(1) of the Act, 21 U.S.C. section 360bbb-3(b)(1), unless the authorization is terminated or revoked.  Performed at [facility], [place] [address], [place], [place] [address]   Culture, blood (Routine x 2)     Status: Abnormal   Collection Time: 11/08/22  6:38 PM   Specimen: BLOOD  Result Value Ref Range Status   Specimen Description BLOOD RIGHT ANTECUBITAL  Final   Special Requests   Final    BOTTLES DRAWN AEROBIC AND ANAEROBIC Blood Culture adequate volume   Culture  Setup Time   Final    GRAM NEGATIVE RODS IN BOTH AEROBIC AND ANAEROBIC BOTTLES CRITICAL VALUE NOTED.  VALUE IS CONSISTENT WITH PREVIOUSLY REPORTED AND CALLED VALUE.     Culture (A)  Final    ESCHERICHIA COLI SUSCEPTIBILITIES PERFORMED ON PREVIOUS CULTURE WITHIN THE LAST 5 DAYS. Performed at [facility], [place] [address] [place], [place] [address]    Report Status 11/11/2022 FINAL  Final  Urine Culture (for pregnant, neutropenic or urologic patients or patients with an indwelling urinary catheter)     Status: Abnormal   Collection Time: 11/08/22  8:27 PM   Specimen: Urine, Clean Catch  Result Value Ref Range Status   Specimen Description URINE, CLEAN CATCH  Final   Special Requests   Final    NONE Performed at [facility], [place] [address], [place], [place] [address]    Culture (A)  Final    70,000 COLONIES/mL ESCHERICHIA COLI Confirmed Extended Spectrum Beta-Lactamase Producer (ESBL).  In bloodstream infections from ESBL organisms, carbapenems are preferred over piperacillin/tazobactam. They are shown to have a lower risk of mortality.    Report Status 11/10/2022 FINAL  Final   Organism ID, Bacteria ESCHERICHIA COLI (A)  Final      Susceptibility   Escherichia coli - MIC*    AMPICILLIN >=32 RESISTANT Resistant     CEFAZOLIN >=64 RESISTANT Resistant     CEFEPIME >=32 RESISTANT Resistant     CEFTRIAXONE >=64 RESISTANT Resistant     CIPROFLOXACIN >=4 RESISTANT Resistant     GENTAMICIN <=1 SENSITIVE Sensitive     IMIPENEM <=0.25 SENSITIVE Sensitive     NITROFURANTOIN <=  16 SENSITIVE Sensitive     TRIMETH/SULFA >=320 RESISTANT Resistant     AMPICILLIN/SULBACTAM >=32 RESISTANT Resistant     PIP/TAZO 8 SENSITIVE Sensitive     * 70,000 COLONIES/mL ESCHERICHIA COLI  Urine Culture (for pregnant, neutropenic or urologic patients or patients with an indwelling urinary catheter)     Status: Abnormal   Collection Time: 11/08/22 11:30 PM   Specimen: Urine, Clean Catch  Result Value Ref Range Status   Specimen Description URINE, CLEAN CATCH  Final   Special Requests   Final    NONE Performed at [facility], [place] [address],  [place], [place] [address]    Culture (A)  Final    50,000 COLONIES/mL ESCHERICHIA COLI Confirmed Extended Spectrum Beta-Lactamase Producer (ESBL).  In bloodstream infections from ESBL organisms, carbapenems are preferred over piperacillin/tazobactam. They are shown to have a lower risk of mortality.    Report Status 11/10/2022 FINAL  Final   Organism ID, Bacteria ESCHERICHIA COLI (A)  Final      Susceptibility   Escherichia coli - MIC*    AMPICILLIN >=32 RESISTANT Resistant     CEFAZOLIN >=64 RESISTANT Resistant     CEFEPIME >=32 RESISTANT Resistant     CEFTRIAXONE >=64 RESISTANT Resistant     CIPROFLOXACIN >=4 RESISTANT Resistant     GENTAMICIN <=1 SENSITIVE Sensitive     IMIPENEM <=0.25 SENSITIVE Sensitive     NITROFURANTOIN <=16 SENSITIVE Sensitive     TRIMETH/SULFA >=320 RESISTANT Resistant     AMPICILLIN/SULBACTAM >=32 RESISTANT Resistant     PIP/TAZO 8 SENSITIVE Sensitive     * 50,000 COLONIES/mL ESCHERICHIA COLI  MRSA Next Gen by PCR, Nasal     Status: None   Collection Time: 11/08/22 11:30 PM   Specimen: Nasal Mucosa; Nasal Swab  Result Value Ref Range Status   MRSA by PCR Next Gen NOT DETECTED NOT DETECTED Final    Comment: (NOTE) The GeneXpert MRSA Assay (FDA approved for NASAL specimens only), is one component of a comprehensive MRSA colonization surveillance program. It is not intended to diagnose MRSA infection nor to guide or monitor treatment for MRSA infections. Test performance is not FDA approved in patients less than 18 years old. Performed at [facility], [place] [address] [place], [place] [address]   Culture, blood (Routine X 2) w Reflex to ID Panel     Status: None   Collection Time: 11/11/22  7:11 AM   Specimen: BLOOD LEFT HAND  Result Value Ref Range Status   Specimen Description BLOOD LEFT HAND  Final   Special Requests   Final    BOTTLES DRAWN AEROBIC ONLY Blood Culture adequate volume   Culture   Final    NO GROWTH 5 DAYS Performed at [facility], [place] [address] [place], [place] [address]    Report Status 11/16/2022 FINAL  Final  Culture, blood (Routine X 2) w Reflex to ID Panel     Status: None   Collection Time: 11/11/22  7:20 AM   Specimen: BLOOD RIGHT HAND  Result Value Ref Range Status   Specimen Description BLOOD RIGHT HAND  Final   Special Requests   Final    BOTTLES DRAWN AEROBIC ONLY Blood Culture adequate volume   Culture   Final    NO GROWTH 5 DAYS Performed at [facility], [place] [address], [place], [place] [address]    Report Status 11/16/2022 FINAL  Final    Scheduled Meds:  albuterol  2.5 mg Nebulization Q6H   cycloSPORINE  1 drop Both Eyes BID   dextromethorphan-guaiFENesin  1 tablet Oral BID   docusate sodium  100 mg Oral Daily   DULoxetine  30 mg Oral Daily   enoxaparin (LOVENOX) injection  40 mg Subcutaneous Q24H   famotidine  20 mg Oral BID   fluticasone  1 spray Each Nare Daily   gabapentin  300 mg Oral BID   mometasone-formoterol  2 puff Inhalation BID   oxyCODONE  20 mg Oral Q12H   pantoprazole  40 mg Oral Daily   psyllium  1 packet Oral Daily   senna-docusate  1 tablet Oral Q M,W,F-2000   Continuous Infusions:  sodium chloride 10 mL/hr at 11/17/22 1138   meropenem (MERREM) IV 1 g (11/18/22 0531)    Principal Problem:   Bacteremia Active Problems:   Dyspnea   Infection due to ESBL-producing Escherichia coli   Acute pyelonephritis   Sepsis ([place])   LOS: 10 days   A & P  Sepsis ([place]) Sepsis resolved. Due to pyelonephritis secondary to ESBL E. Coli grown from urine and 2/2.The patient is receiving Meropenem. Blood cultures x 2 from 11/08/2022 also positive for ESBL E. Coli. Repeat blood cultures from 11/11/2022 has had no growth for 5 days.    Infection due to ESBL-producing Escherichia coli ESBL E. Coli grown from urine and 2/2.The patient is receiving Meropenem. Blood cultures x 2 from 11/08/2022 also positive for ESBL E. Coli. Repeat blood cultures from 11/11/2022 has had  no growth for 5 days.  Bacteremia Blood cultures x 2 from 11/08/2022 also positive for ESBL E. Coli. Repeat blood cultures from 11/11/2022 has had no growth for 5 days. She is receiving Meropenem. Last day of IV antibiotics is 11/18/2022. Per pharmacy no need for conversion to oral antibiotics.  Acute pyelonephritis ESBL E. Coli grown from urine and 2/2.The patient is receiving Meropenem. Blood cultures x 2 from 11/08/2022 also positive for ESBL E. Coli. Repeat blood cultures from 11/11/2022 has had no growth for 5 days.   DVT prophylaxis: Lovenox Code Status: Full Code Family Communication: None available Disposition Plan: Home    Dana Allan, MD. [facility] Direct contact: see [email or web address]  7PM-7AM contact night coverage as above 11/17/2022, 5:09 PM  LOS: 8 days

## 2022-11-18 NOTE — Progress Notes (Signed)
Mobility Specialist Progress Note   11/18/22 1030  Mobility  Activity Repositioned in chair (LE exercise)  Level of Assistance Standby assist, set-up cues, supervision of patient - no hands on  Assistive Device None  Range of Motion/Exercises Active;All extremities  Activity Response Tolerated well   Patient received in recliner, stated she just ambulated in room with student RN. Deferred further ambulation at this time but agreeable to seated LE exercises. Completed LE exercise with supervision and without complaint or incident. Was left in recliner with all needs met, call bell in reach. Will follow up as time permits for ambulation.   [place] Gregorio Worley, BS EXP Mobility Specialist Please contact via SecureChat or Rehab office at [phone]

## 2022-11-20 ENCOUNTER — Telehealth: Payer: Self-pay

## 2022-11-20 NOTE — Transitions of Care (Post Inpatient/ED Visit) (Unsigned)
   11/20/2022  Name: [name] MRN: [id number] DOB: 1951/01/11  Today's TOC FU Call Status: Today's TOC FU Call Status:: Unsuccessul Call (1st Attempt) Unsuccessful Call (1st Attempt) Date: 11/20/22  Attempted to reach the patient regarding the most recent Inpatient/ED visit.  Follow Up Plan: Additional outreach attempts will be made to reach the patient to complete the Transitions of Care (Post Inpatient/ED visit) call.   [place] LPN [place] Advisor Direct Dial [phone]

## 2022-11-22 NOTE — Transitions of Care (Post Inpatient/ED Visit) (Signed)
   11/22/2022  Name: [name] MRN: [id number] DOB: 1951/06/01  Today's TOC FU Call Status: Today's TOC FU Call Status:: Unsuccessful Call (3rd Attempt) Unsuccessful Call (1st Attempt) Date: 11/20/22 Unsuccessful Call (3rd Attempt) Date: 11/22/22  Attempted to reach the patient regarding the most recent Inpatient/ED visit.  Follow Up Plan: No further outreach attempts will be made at this time. We have been unable to contact the patient.  [place] LPN [place] Advisor Direct Dial [phone]

## 2022-11-23 NOTE — Transitions of Care (Post Inpatient/ED Visit) (Signed)
   11/23/2022  Name: [name] MRN: [id number] DOB: 1951/03/03  Today's TOC FU Call Status: Today's TOC FU Call Status:: Unsuccessful Call (3rd Attempt) Unsuccessful Call (1st Attempt) Date: 11/20/22 Unsuccessful Call (3rd Attempt) Date: 11/23/22  Attempted to reach the patient regarding the most recent Inpatient/ED visit.  Follow Up Plan: No further outreach attempts will be made at this time. We have been unable to contact the patient.  [place] LPN [place] Advisor Direct Dial [phone]

## 2022-11-29 ENCOUNTER — Inpatient Hospital Stay (HOSPITAL_COMMUNITY)
Admission: EM | Admit: 2022-11-29 | Discharge: 2022-12-01 | DRG: 690 | Disposition: A | Discharge status: Nursing Home (Includes ICF) | Payer: Medicare PPO | Source: Skilled Nursing Facility | Attending: Internal Medicine | Admitting: Internal Medicine

## 2022-11-29 ENCOUNTER — Encounter (HOSPITAL_COMMUNITY): Payer: Self-pay

## 2022-11-29 ENCOUNTER — Other Ambulatory Visit: Payer: Self-pay

## 2022-11-29 ENCOUNTER — Emergency Department (HOSPITAL_COMMUNITY): Payer: Medicare PPO

## 2022-11-29 DIAGNOSIS — Z888 Allergy status to other drugs, medicaments and biological substances status: Secondary | ICD-10-CM | POA: Diagnosis not present

## 2022-11-29 DIAGNOSIS — E876 Hypokalemia: Secondary | ICD-10-CM | POA: Diagnosis present

## 2022-11-29 DIAGNOSIS — Z882 Allergy status to sulfonamides status: Secondary | ICD-10-CM | POA: Diagnosis not present

## 2022-11-29 DIAGNOSIS — Z1612 Extended spectrum beta lactamase (ESBL) resistance: Secondary | ICD-10-CM | POA: Diagnosis not present

## 2022-11-29 DIAGNOSIS — M069 Rheumatoid arthritis, unspecified: Secondary | ICD-10-CM

## 2022-11-29 DIAGNOSIS — H409 Unspecified glaucoma: Secondary | ICD-10-CM | POA: Diagnosis present

## 2022-11-29 DIAGNOSIS — A419 Sepsis, unspecified organism: Secondary | ICD-10-CM | POA: Diagnosis not present

## 2022-11-29 DIAGNOSIS — Z823 Family history of stroke: Secondary | ICD-10-CM | POA: Diagnosis not present

## 2022-11-29 DIAGNOSIS — Z96653 Presence of artificial knee joint, bilateral: Secondary | ICD-10-CM | POA: Diagnosis present

## 2022-11-29 DIAGNOSIS — Z8249 Family history of ischemic heart disease and other diseases of the circulatory system: Secondary | ICD-10-CM

## 2022-11-29 DIAGNOSIS — N390 Urinary tract infection, site not specified: Secondary | ICD-10-CM | POA: Diagnosis present

## 2022-11-29 DIAGNOSIS — Z833 Family history of diabetes mellitus: Secondary | ICD-10-CM | POA: Diagnosis not present

## 2022-11-29 DIAGNOSIS — Z886 Allergy status to analgesic agent status: Secondary | ICD-10-CM

## 2022-11-29 DIAGNOSIS — E872 Acidosis, unspecified: Secondary | ICD-10-CM | POA: Diagnosis present

## 2022-11-29 DIAGNOSIS — J45909 Unspecified asthma, uncomplicated: Secondary | ICD-10-CM | POA: Diagnosis not present

## 2022-11-29 DIAGNOSIS — I10 Essential (primary) hypertension: Secondary | ICD-10-CM | POA: Diagnosis present

## 2022-11-29 DIAGNOSIS — Z87440 Personal history of urinary (tract) infections: Secondary | ICD-10-CM

## 2022-11-29 DIAGNOSIS — A498 Other bacterial infections of unspecified site: Secondary | ICD-10-CM | POA: Diagnosis not present

## 2022-11-29 DIAGNOSIS — Z7983 Long term (current) use of bisphosphonates: Secondary | ICD-10-CM | POA: Diagnosis not present

## 2022-11-29 DIAGNOSIS — N3000 Acute cystitis without hematuria: Secondary | ICD-10-CM | POA: Diagnosis present

## 2022-11-29 DIAGNOSIS — Z79899 Other long term (current) drug therapy: Secondary | ICD-10-CM | POA: Diagnosis not present

## 2022-11-29 DIAGNOSIS — B9620 Unspecified Escherichia coli [E. coli] as the cause of diseases classified elsewhere: Secondary | ICD-10-CM | POA: Diagnosis present

## 2022-11-29 DIAGNOSIS — R509 Fever, unspecified: Principal | ICD-10-CM

## 2022-11-29 DIAGNOSIS — K219 Gastro-esophageal reflux disease without esophagitis: Secondary | ICD-10-CM | POA: Diagnosis present

## 2022-11-29 DIAGNOSIS — D869 Sarcoidosis, unspecified: Secondary | ICD-10-CM | POA: Diagnosis present

## 2022-11-29 DIAGNOSIS — E669 Obesity, unspecified: Secondary | ICD-10-CM | POA: Diagnosis present

## 2022-11-29 DIAGNOSIS — Z1152 Encounter for screening for COVID-19: Secondary | ICD-10-CM | POA: Diagnosis not present

## 2022-11-29 LAB — URINALYSIS, W/ REFLEX TO CULTURE (INFECTION SUSPECTED)
Bilirubin Urine: NEGATIVE
Glucose, UA: NEGATIVE mg/dL
Ketones, ur: NEGATIVE mg/dL
Nitrite: NEGATIVE
Protein, ur: NEGATIVE mg/dL
Specific Gravity, Urine: 1.01 (ref 1.005–1.030)
pH: 6.5 (ref 5.0–8.0)

## 2022-11-29 LAB — CBC WITH DIFFERENTIAL/PLATELET
Abs Immature Granulocytes: 0.04 10*3/uL (ref 0.00–0.07)
Basophils Absolute: 0 10*3/uL (ref 0.0–0.1)
Basophils Relative: 1 %
Eosinophils Absolute: 0.3 10*3/uL (ref 0.0–0.5)
Eosinophils Relative: 6 %
HCT: 35.3 % — ABNORMAL LOW (ref 36.0–46.0)
Hemoglobin: 10.7 g/dL — ABNORMAL LOW (ref 12.0–15.0)
Immature Granulocytes: 1 %
Lymphocytes Relative: 35 %
Lymphs Abs: 1.9 10*3/uL (ref 0.7–4.0)
MCH: 29.5 pg (ref 26.0–34.0)
MCHC: 30.3 g/dL (ref 30.0–36.0)
MCV: 97.2 fL (ref 80.0–100.0)
Monocytes Absolute: 0.7 10*3/uL (ref 0.1–1.0)
Monocytes Relative: 12 %
Neutro Abs: 2.5 10*3/uL (ref 1.7–7.7)
Neutrophils Relative %: 45 %
Platelets: 404 10*3/uL — ABNORMAL HIGH (ref 150–400)
RBC: 3.63 MIL/uL — ABNORMAL LOW (ref 3.87–5.11)
RDW: 16.4 % — ABNORMAL HIGH (ref 11.5–15.5)
WBC: 5.4 10*3/uL (ref 4.0–10.5)
nRBC: 0 % (ref 0.0–0.2)

## 2022-11-29 LAB — PROTIME-INR
INR: 1.1 (ref 0.8–1.2)
Prothrombin Time: 13.6 seconds (ref 11.4–15.2)

## 2022-11-29 LAB — COMPREHENSIVE METABOLIC PANEL
ALT: 11 U/L (ref 0–44)
AST: 22 U/L (ref 15–41)
Albumin: 2.9 g/dL — ABNORMAL LOW (ref 3.5–5.0)
Alkaline Phosphatase: 91 U/L (ref 38–126)
Anion gap: 12 (ref 5–15)
BUN: 5 mg/dL — ABNORMAL LOW (ref 8–23)
CO2: 24 mmol/L (ref 22–32)
Calcium: 8.3 mg/dL — ABNORMAL LOW (ref 8.9–10.3)
Chloride: 104 mmol/L (ref 98–111)
Creatinine, Ser: 0.58 mg/dL (ref 0.44–1.00)
GFR, Estimated: >60 mL/min (ref >60)
Glucose, Bld: 134 mg/dL — ABNORMAL HIGH (ref 70–99)
Potassium: 3.2 mmol/L — ABNORMAL LOW (ref 3.5–5.1)
Sodium: 140 mmol/L (ref 135–145)
Total Bilirubin: 0.5 mg/dL (ref 0.3–1.2)
Total Protein: 6 g/dL — ABNORMAL LOW (ref 6.5–8.1)

## 2022-11-29 LAB — RESP PANEL BY RT-PCR (RSV, FLU A&B, COVID)  RVPGX2
Influenza A by PCR: NEGATIVE
Influenza B by PCR: NEGATIVE
Resp Syncytial Virus by PCR: NEGATIVE
SARS Coronavirus 2 by RT PCR: NEGATIVE

## 2022-11-29 LAB — LACTIC ACID, PLASMA
Lactic Acid, Venous: 2.2 mmol/L (ref 0.5–1.9)
Lactic Acid, Venous: 1 mmol/L (ref 0.5–1.9)

## 2022-11-29 LAB — APTT: aPTT: 29 seconds (ref 24–36)

## 2022-11-29 MED ORDER — SODIUM CHLORIDE 0.45 % IV SOLN: INTRAVENOUS | Status: DC

## 2022-11-29 MED ORDER — POTASSIUM CHLORIDE CRYS ER 20 MEQ PO TBCR
40.0000 meq | EXTENDED_RELEASE_TABLET | Freq: Once | ORAL | Status: AC
  Administered 2022-11-29: 40 meq via ORAL
  Filled 2022-11-29: qty 2

## 2022-11-29 MED ORDER — ENOXAPARIN SODIUM 40 MG/0.4ML IJ SOSY
40.0000 mg | PREFILLED_SYRINGE | INTRAMUSCULAR | Status: DC
  Administered 2022-11-29 – 2022-11-30 (×2): 40 mg via SUBCUTANEOUS
  Filled 2022-11-29 (×3): qty 0.4

## 2022-11-29 MED ORDER — HYDROCODONE-ACETAMINOPHEN 5-325 MG PO TABS: 1.0000 | ORAL_TABLET | Freq: Three times a day (TID) | ORAL | Status: DC | PRN

## 2022-11-29 MED ORDER — ENOXAPARIN SODIUM 40 MG/0.4ML IJ SOSY: 40.0000 mg | PREFILLED_SYRINGE | INTRAMUSCULAR | Status: DC

## 2022-11-29 MED ORDER — SODIUM CHLORIDE 0.9 % IV BOLUS
1000.0000 mL | Freq: Once | INTRAVENOUS | Status: AC
  Administered 2022-11-29: 1000 mL via INTRAVENOUS

## 2022-11-29 MED ORDER — ACETAMINOPHEN 500 MG PO TABS
1000.0000 mg | ORAL_TABLET | ORAL | Status: DC
  Administered 2022-11-29 – 2022-12-01 (×4): 1000 mg via ORAL
  Filled 2022-11-29 (×4): qty 2

## 2022-11-29 MED ORDER — ZOLPIDEM TARTRATE 5 MG PO TABS: 5.0000 mg | ORAL_TABLET | Freq: Every evening | ORAL | Status: DC | PRN

## 2022-11-29 MED ORDER — ALBUTEROL SULFATE (2.5 MG/3ML) 0.083% IN NEBU: 2.5000 mg | INHALATION_SOLUTION | Freq: Four times a day (QID) | RESPIRATORY_TRACT | Status: DC | PRN

## 2022-11-29 MED ORDER — DULOXETINE HCL 30 MG PO CPEP
30.0000 mg | ORAL_CAPSULE | Freq: Every day | ORAL | Status: DC
  Administered 2022-11-29 – 2022-12-01 (×3): 30 mg via ORAL
  Filled 2022-11-29 (×3): qty 1

## 2022-11-29 MED ORDER — ALENDRONATE SODIUM 70 MG PO TABS: 70.0000 mg | ORAL_TABLET | ORAL | Status: DC

## 2022-11-29 MED ORDER — SENNA 8.6 MG PO TABS: 1.0000 | ORAL_TABLET | Freq: Two times a day (BID) | ORAL | Status: DC

## 2022-11-29 MED ORDER — PREDNISONE 10 MG PO TABS
5.0000 mg | ORAL_TABLET | Freq: Every day | ORAL | Status: DC
  Administered 2022-11-29 – 2022-12-01 (×3): 5 mg via ORAL
  Filled 2022-11-29 (×3): qty 1

## 2022-11-29 MED ORDER — PANTOPRAZOLE SODIUM 40 MG PO TBEC
40.0000 mg | DELAYED_RELEASE_TABLET | Freq: Every day | ORAL | Status: DC
  Administered 2022-11-29 – 2022-12-01 (×3): 40 mg via ORAL
  Filled 2022-11-29 (×3): qty 1

## 2022-11-29 MED ORDER — VITAMIN D (ERGOCALCIFEROL) 1.25 MG (50000 UNIT) PO CAPS: 50000.0000 [IU] | ORAL_CAPSULE | ORAL | Status: DC

## 2022-11-29 MED ORDER — FLUTICASONE PROPIONATE 50 MCG/ACT NA SUSP
1.0000 | Freq: Every day | NASAL | Status: DC
  Administered 2022-11-30 – 2022-12-01 (×2): 1 via NASAL
  Filled 2022-11-29: qty 16

## 2022-11-29 MED ORDER — SODIUM CHLORIDE 0.9 % IV SOLN
1.0000 g | Freq: Three times a day (TID) | INTRAVENOUS | Status: DC
  Administered 2022-11-29 – 2022-12-01 (×6): 1 g via INTRAVENOUS
  Filled 2022-11-29 (×8): qty 20

## 2022-11-29 MED ORDER — GABAPENTIN 300 MG PO CAPS
300.0000 mg | ORAL_CAPSULE | Freq: Two times a day (BID) | ORAL | Status: DC
  Administered 2022-11-29 – 2022-12-01 (×5): 300 mg via ORAL
  Filled 2022-11-29 (×5): qty 1

## 2022-11-29 MED ORDER — LORATADINE 10 MG PO TABS
10.0000 mg | ORAL_TABLET | Freq: Every day | ORAL | Status: DC
  Administered 2022-11-29 – 2022-11-30 (×2): 10 mg via ORAL
  Filled 2022-11-29 (×2): qty 1

## 2022-11-29 MED ORDER — ZOLPIDEM TARTRATE 5 MG PO TABS
5.0000 mg | ORAL_TABLET | Freq: Every evening | ORAL | Status: DC | PRN
  Filled 2022-11-29: qty 1

## 2022-11-29 MED ORDER — METHOTREXATE 2.5 MG PO TABS
20.0000 mg | ORAL_TABLET | ORAL | Status: DC
  Administered 2022-11-29: 20 mg via ORAL
  Filled 2022-11-29: qty 8

## 2022-11-29 MED ORDER — MOMETASONE FURO-FORMOTEROL FUM 200-5 MCG/ACT IN AERO
2.0000 | INHALATION_SPRAY | Freq: Two times a day (BID) | RESPIRATORY_TRACT | Status: DC
  Administered 2022-11-29 – 2022-12-01 (×4): 2 via RESPIRATORY_TRACT
  Filled 2022-11-29: qty 8.8

## 2022-11-29 MED ORDER — CYCLOSPORINE 0.05 % OP EMUL
1.0000 [drp] | Freq: Two times a day (BID) | OPHTHALMIC | Status: DC
  Administered 2022-11-29 – 2022-12-01 (×4): 1 [drp] via OPHTHALMIC
  Filled 2022-11-29 (×5): qty 30

## 2022-11-29 MED ORDER — SENNA 8.6 MG PO TABS
1.0000 | ORAL_TABLET | Freq: Two times a day (BID) | ORAL | Status: DC
  Administered 2022-11-29 – 2022-11-30 (×3): 8.6 mg via ORAL
  Filled 2022-11-29 (×4): qty 1

## 2022-11-29 MED ORDER — PSYLLIUM 95 % PO PACK
1.0000 | PACK | Freq: Every day | ORAL | Status: DC
  Filled 2022-11-29 (×2): qty 1

## 2022-11-29 MED ORDER — FOLIC ACID 1 MG PO TABS
1.0000 mg | ORAL_TABLET | Freq: Every day | ORAL | Status: DC
  Administered 2022-11-29 – 2022-12-01 (×3): 1 mg via ORAL
  Filled 2022-11-29 (×3): qty 1

## 2022-11-29 NOTE — H&P (Signed)
History and Physical    Penne Braman Bensen [id number] DOB: January 20, 1951 DOA: 11/29/2022  DOS: the patient was seen and examined on 11/29/2022  PCP: [name], MD   Patient coming from: SNF  I have personally briefly reviewed patient's old medical records in [place]  Ms Lyne, a 72 y/o with a past medical history significant for rheumatoid arthritis, GERD, asthma, diverticulosis, obesity, sarcoidosis not on treatment, recent admission 2/7-2/17/24 for sepsis secondary to UTI, ESBL E. coli bacteremia and right-sided pyelonephritis -- presents from her senior living facility [facility]) today for evaluation of fever patient states that yesterday she started "feeling warm" all day and developed a scratchy throat. She is transported to [facility] for further evaluation.   ED Course: Tr 100, oral temp at admission 98  172/96  HR 86  RR 20. ED-PA exam notable for tachycardia. Lab - K 3.2 (given replenishment in ED) glucose 134, Alb 2.9, T. Protein 6. Serum lactic acid 2.2, on repeat 1.0. WBC 5.4 w/ nl diff, Hgb 10.7. U/A - cloudy, many bacteria/hpf, WBC 6-10/hpf. Meropenem started in ED. TRH called to admit for continued evaluation and tx.   Review of Systems:  Review of Systems  Constitutional:  Positive for fever. Negative for chills, malaise/fatigue and weight loss.  HENT: Negative.    Respiratory: Negative.    Cardiovascular: Negative.   Gastrointestinal: Negative.   Genitourinary:  Positive for dysuria and frequency.  Musculoskeletal: Negative.   Skin: Negative.   Neurological: Negative.   Endo/Heme/Allergies: Negative.   Psychiatric/Behavioral: Negative.      Past Medical History:  Diagnosis Date   Acute bronchitis    Allergy    Anxiety    Arthritis    knees   Asthma    Backache, unspecified    Cataract    removed both eyes   Depression    Diverticulitis    Diverticulosis of colon (without mention of hemorrhage)    Edema    GERD (gastroesophageal reflux  disease)    Glaucoma    Lumbago    Obesity    Other chest pain    Pain in joint, ankle and foot    Sarcoidosis    Tibialis tendinitis    Unspecified asthma(493.90)     Past Surgical History:  Procedure Laterality Date   ABDOMINAL HYSTERECTOMY     CATARACT EXTRACTION W/PHACO Right 06/24/2014   Procedure: CATARACT EXTRACTION PHACO AND INTRAOCULAR LENS PLACEMENT (IOC) RIGHT EYE WITH GONIOSYNECHIALYSIS;  Surgeon: Marylynn Pearson, MD;  Location: [facility];  Service: Ophthalmology;  Laterality: Right;   COLONOSCOPY     EYE SURGERY Left    cataract surgery   HAND SURGERY Right    trigger finger release   IR RADIOLOGIST EVAL & MGMT  11/14/2018   KNEE ARTHROSCOPY Right    TOTAL KNEE ARTHROPLASTY Right 12/08/2021   Procedure: RIGHT TOTAL KNEE ARTHROPLASTY APPLICATION OF WOUND VAC;  Surgeon: Meredith Pel, MD;  Location: [facility];  Service: Orthopedics;  Laterality: Right;   TOTAL KNEE ARTHROPLASTY Left 04/13/2022   Procedure: LEFT TOTAL KNEE ARTHROPLASTY;  Surgeon: Meredith Pel, MD;  Location: [facility];  Service: Orthopedics;  Laterality: Left;    Social Hx - widowed after 63 years of marriage. No children. 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 Due to RA and joint replacements resides in long-term care facility.    reports that she has never smoked. She has never used smokeless tobacco. She reports current alcohol use. She reports that she does  not use drugs.  Allergies  Allergen Reactions   Invanz [Ertapenem] Swelling    "Lips swelled up"   Ibuprofen Rash   Sulfa Antibiotics Rash    Family History  Problem Relation Age of Onset   Hypertension Mother    Stroke Mother    Diabetes Father    Sarcoidosis Sister    Colon cancer Neg Hx    Colon polyps Neg Hx    Esophageal cancer Neg Hx    Rectal cancer Neg Hx    Stomach cancer Neg Hx     Prior to Admission medications   Medication Sig Start Date End Date Taking? Authorizing Provider  acetaminophen (TYLENOL) 500 MG tablet  Take 1,000 mg by mouth in the morning and at bedtime.    [provider]  albuterol (VENTOLIN HFA) 108 (90 Base) MCG/ACT inhaler TAKE 2 PUFFS BY MOUTH EVERY 6 HOURS AS NEEDED Patient taking differently: Inhale 2 puffs into the lungs every 6 (six) hours as needed for wheezing or shortness of breath. TAKE 2 PUFFS BY MOUTH EVERY 6 HOURS AS NEEDED 06/26/22   [name], MD  alendronate (FOSAMAX) 70 MG tablet Take 70 mg by mouth every Monday.    [provider]  amoxicillin (AMOXIL) 500 MG capsule Take 2,000 mg by mouth See admin instructions. Take '2000mg'$  by mouth 1 hour before appt.    [provider]  cyanocobalamin (VITAMIN B12) 1000 MCG/ML injection Inject 1,000 mcg into the muscle every 30 (thirty) days. 10/31/22   [provider]  cycloSPORINE (RESTASIS) 0.05 % ophthalmic emulsion Place 1 drop into both eyes 2 (two) times daily.    [provider]  DULoxetine (CYMBALTA) 30 MG capsule Take 1 capsule (30 mg total) by mouth daily. 11/19/22 01/18/23  Bonnell Public, MD  fluticasone (FLONASE) 50 MCG/ACT nasal spray Place 1 spray into both nostrils daily. 08/17/21   [name], MD  folic acid (FOLVITE) 1 MG tablet Take 1 mg by mouth daily. 10/31/22   [provider]  gabapentin (NEURONTIN) 300 MG capsule Take 300 mg by mouth 2 (two) times daily.    [provider]  HUMIRA, 2 PEN, 40 MG/0.8ML PNKT Inject into the skin See admin instructions. Give every two weeks. Will be administered by home health.    [provider]  HYDROcodone-acetaminophen (NORCO/VICODIN) 5-325 MG tablet Take 1 tablet by mouth every 8 (eight) hours as needed for moderate pain or severe pain. 08/29/22   [provider]  levocetirizine (XYZAL ALLERGY 24HR) 5 MG tablet Take 1 tablet (5 mg total) by mouth every evening. 08/17/21   [name], MD  methotrexate (RHEUMATREX) 2.5 MG tablet Take 20 mg by mouth every Wednesday. 06/13/22   [provider]  pantoprazole (PROTONIX) 40 MG tablet Take 1 tablet (40 mg total) by mouth daily. 11/19/22 12/19/22  Dana Allan I, MD  polyethylene glycol powder (GLYCOLAX/MIRALAX) 17 GM/SCOOP powder Take 17 g by mouth daily as needed. Patient not taking: Reported on 11/09/2022 08/17/21   [name], MD  predniSONE (DELTASONE) 5 MG tablet Take 5 mg by mouth daily.    [provider]  psyllium (HYDROCIL/METAMUCIL) 95 % PACK Take 1 packet by mouth daily. 11/19/22 12/19/22  Bonnell Public, MD  Vitamin D, Ergocalciferol, (DRISDOL) 1.25 MG (50000 UNIT) CAPS capsule Take 1 capsule (50,000 Units total) by mouth every 7 (seven) days. Patient taking differently: Take 50,000 Units by mouth every Wednesday. 05/26/21   [name],  MD  WIXELA INHUB 250-50 MCG/ACT AEPB INHALE 1 PUFF BY MOUTH TWICE A DAY Patient taking differently: Inhale 1 puff into the lungs in the morning and at bedtime. 06/07/21   [name], MD    Physical Exam: Vitals:   11/29/22 0837 11/29/22 0900 11/29/22 1000 11/29/22 1015  BP: (!) 152/93 (!) 159/93 (!) 172/96   Pulse: 94 90 87 86  Resp: (!) 25 (!) 21 (!) 21 20  Temp:      TempSrc:      SpO2: 98% 98% 97% 97%  Weight:      Height:        Physical Exam Vitals and nursing note reviewed.  Constitutional:      General: She is not in acute distress.    Appearance: Normal appearance. She is not ill-appearing.     Comments: overweight  HENT:     Head: Normocephalic and atraumatic.     Mouth/Throat:     Mouth: Mucous membranes are moist.     Pharynx: Oropharyngeal exudate present.  Eyes:     Extraocular Movements: Extraocular movements intact.     Conjunctiva/sclera: Conjunctivae normal.     Pupils: Pupils are equal, round, and reactive to light.  Cardiovascular:     Rate and Rhythm: Normal rate and regular rhythm.     Pulses: Normal pulses.     Heart sounds: Normal heart sounds.  Pulmonary:     Effort: Pulmonary effort is normal.      Breath sounds: Normal breath sounds.  Abdominal:     General: Bowel sounds are normal.     Palpations: Abdomen is soft.  Musculoskeletal:        General: Deformity present. Normal range of motion.     Cervical back: Normal range of motion. No rigidity or tenderness.     Right lower leg: No edema.     Left lower leg: No edema.     Comments: Right hand with enlargement 1st PIP, left hand with swan neck deformity. No hot or acutely swollen small or medium joints.  Skin:    General: Skin is warm and dry.  Neurological:     General: No focal deficit present.     Mental Status: She is alert and oriented to person, place, and time. Mental status is at baseline.  Psychiatric:        Mood and Affect: Mood normal.        Behavior: Behavior normal.      Labs on Admission: I have personally reviewed following labs and imaging studies  CBC: Recent Labs  Lab 11/29/22 0613  WBC 5.4  NEUTROABS 2.5  HGB 10.7*  HCT 35.3*  MCV 97.2  PLT [id number]*   Basic Metabolic Panel: Recent Labs  Lab 11/29/22 0613  NA 140  K 3.2*  CL 104  CO2 24  GLUCOSE 134*  BUN 5*  CREATININE 0.58  CALCIUM 8.3*   GFR: Estimated Creatinine Clearance: 60.2 mL/min (by C-G formula based on SCr of 0.58 mg/dL). Liver Function Tests: Recent Labs  Lab 11/29/22 0613  AST 22  ALT 11  ALKPHOS 91  BILITOT 0.5  PROT 6.0*  ALBUMIN 2.9*   No results for input(s): "LIPASE", "AMYLASE" in the last 168 hours. No results for input(s): "AMMONIA" in the last 168 hours. Coagulation Profile: Recent Labs  Lab 11/29/22 0613  INR 1.1   Cardiac Enzymes: No results for input(s): "CKTOTAL", "CKMB", "CKMBINDEX", "TROPONINI" in the last 168 hours. BNP (last 3 results) No results  for input(s): "PROBNP" in the last 8760 hours. HbA1C: No results for input(s): "HGBA1C" in the last 72 hours. CBG: No results for input(s): "GLUCAP" in the last 168 hours. Lipid Profile: No results for input(s): "CHOL", "HDL", "LDLCALC", "TRIG",  "CHOLHDL", "LDLDIRECT" in the last 72 hours. Thyroid Function Tests: No results for input(s): "TSH", "T4TOTAL", "FREET4", "T3FREE", "THYROIDAB" in the last 72 hours. Anemia Panel: No results for input(s): "VITAMINB12", "FOLATE", "FERRITIN", "TIBC", "IRON", "RETICCTPCT" in the last 72 hours. Urine analysis:    Component Value Date/Time   COLORURINE YELLOW 11/29/2022 0850   APPEARANCEUR CLOUDY (A) 11/29/2022 0850   LABSPEC 1.010 11/29/2022 0850   PHURINE 6.5 11/29/2022 0850   GLUCOSEU NEGATIVE 11/29/2022 0850   HGBUR TRACE (A) 11/29/2022 0850   BILIRUBINUR NEGATIVE 11/29/2022 0850   BILIRUBINUR n 09/05/2016 1520   KETONESUR NEGATIVE 11/29/2022 0850   PROTEINUR NEGATIVE 11/29/2022 0850   UROBILINOGEN 1.0 09/05/2016 1520   NITRITE NEGATIVE 11/29/2022 0850   LEUKOCYTESUR SMALL (A) 11/29/2022 0850    Radiological Exams on Admission: I have personally reviewed images DG Chest Port 1 View  Result Date: 11/29/2022 CLINICAL DATA:  Questionable sepsis.  Fever and tachycardia EXAM: PORTABLE CHEST 1 VIEW COMPARISON:  11/08/2022 FINDINGS: Artifact from EKG leads. Normal heart size and mediastinal contours. No acute infiltrate or edema. No effusion or pneumothorax. No acute osseous findings. IMPRESSION: No evidence of active disease. Electronically Signed   By: Jorje Guild M.D.   On: 11/29/2022 06:35    EKG: I have personally reviewed EKG: sinus tachycardia w/o acute changes  Assessment/Plan Active Problems:   Infection due to ESBL-producing Escherichia coli   Rheumatoid arthritis (HCC)   Asthma    Assessment and Plan: Infection due to ESBL-producing Escherichia coli Patient with recent Urosepsis due to ESBL E. Coli and had 7 days IV Meropenem. AT discharge final blood cultures were clear. She now presents with early UTI presentation with bacteruria and minimal pyuria. Culture is pending but presumptively with similar flora.  Plan Med-surg admit  Meropenem - will adjust based on  culture results. If positive for ESBL E. Coli - consult to ID for duration of therapy.  Rheumatoid arthritis ([place]) Long-standing diagnosis held at [facility] with MTX, prednisone and Humira. AT last admission MTX, prednisone and Humira held and have not been resumed. No acute flare on exam.   Plan Resume home regimen: MTX, low dose daily prednisone and Humira (which may have to brought from LTC if patient has prolonged hospital stay.  Asthma Stable with no respiratory symptoms  Plan Continue home regimen       DVT prophylaxis: Lovenox Code Status: Full Code Family Communication: called [name] -friend. She understands Dx and Tx plan  Disposition Plan: TBD  Consults called: none  Admission status: Inpatient, Med-Surg   Adella Hare, MD [facility] 11/29/2022, 11:30 AM

## 2022-11-29 NOTE — ED Triage Notes (Addendum)
Pt BIB [facility] from [facility] due to feeling "hot to the touch" and tachycardic. Was on antibiotics which was finished on 2/17 for UTI.  Pt c/o headache and slight cough that started yesterday. EMS gave 650 mg of oral Tylenol.

## 2022-11-29 NOTE — ED Notes (Signed)
Pt given apple sauce and crackers.

## 2022-11-29 NOTE — Assessment & Plan Note (Signed)
Long-standing diagnosis held at [facility] with MTX, prednisone and Humira. AT last admission MTX, prednisone and Humira held and have not been resumed. No acute flare on exam.   Plan Resume home regimen: MTX, low dose daily prednisone and Humira (which may have to brought from LTC if patient has prolonged hospital stay.

## 2022-11-29 NOTE — ED Notes (Signed)
Pt given coffee w/ EDP permission.

## 2022-11-29 NOTE — Progress Notes (Signed)
Pharmacy Antibiotic Note  [name] is a 72 y.o. female admitted on 11/29/2022 with concern for ESBL with recent admission 2/7-2/17 for ESBL E-coli bacteremia with urinary source. At prior admission, patient developed mouth swelling/numbness on ertapenem and was switched to meropenem to finish treatment. Pharmacy has been consulted for meropenem dosing. WBC 5.4, Tmax 100F, Scr 0.58 appears at baseline.   Plan: Meropenem 1g IV q8h  Monitor C&S, renal function, s/sx improvement, LOT and narrow as able  Height: 5' (152.4 cm) Weight: 83.9 kg (185 lb) IBW/kg (Calculated) : 45.5  Temp (24hrs), Avg:98.9 F (37.2 C), Min:98 F (36.7 C), Max:100 F (37.8 C)  Recent Labs  Lab 11/29/22 0613 11/29/22 0618 11/29/22 0751  WBC 5.4  --   --   CREATININE 0.58  --   --   LATICACIDVEN  --  2.2* 1.0    Estimated Creatinine Clearance: 60.2 mL/min (by C-G formula based on SCr of 0.58 mg/dL).    Allergies  Allergen Reactions   Invanz [Ertapenem] Swelling    "Lips swelled up"   Ibuprofen Rash   Sulfa Antibiotics Rash    Antimicrobials this admission: meropenem 2/28 >>   Microbiology results: 2/28 BCx: sent 2/28 RVP: sent 2/28 UCx: sent   Thank you for allowing pharmacy to be a part of this patient's care.  Eliseo Gum, PharmD PGY1 Pharmacy Resident   11/29/2022  10:32 AM

## 2022-11-29 NOTE — Assessment & Plan Note (Signed)
Stable with no respiratory symptoms  Plan Continue home regimen

## 2022-11-29 NOTE — ED Provider Triage Note (Signed)
  Emergency Medicine Provider Triage Evaluation Note  MRN:  [id number]  Arrival date & time: 11/29/22    Medically screening exam initiated at 6:07 AM.   CC:   Fever and Tachycardia   HPI:  [name] is a 72 y.o. year-old female presents to the ED with chief complaint of subjective fever and tachycardia.  Recent admission for sepsis 2/2 ESBL UTI and positive blood cultures.  Reports new cough since yesterday.  Denies pain.  History provided by patient. ROS:  -As included in HPI PE:   Vitals:   11/29/22 0600 11/29/22 0601  BP:  (!) 163/110  Pulse:  (!) 101  Resp:  (!) 30  Temp:  98.7 F (37.1 C)  SpO2: 96% 95%    Non-toxic appearing Tachypnea, CTAB  MDM:  Based on signs and symptoms, sepsis is highest on my differential. I've ordered labs and imaging in triage to expedite lab/diagnostic workup.  Patient was informed that the remainder of the evaluation will be completed by another provider, this initial triage assessment does not replace that evaluation, and the importance of remaining in the ED until their evaluation is complete.    Montine Circle, PA-C 11/29/22 [phone]

## 2022-11-29 NOTE — Assessment & Plan Note (Signed)
Patient with recent Urosepsis due to ESBL E. Coli and had 7 days IV Meropenem. AT discharge final blood cultures were clear. She now presents with early UTI presentation with bacteruria and minimal pyuria. Culture is pending but presumptively with similar flora.  Plan Med-surg admit  Meropenem - will adjust based on culture results. If positive for ESBL E. Coli - consult to ID for duration of therapy.

## 2022-11-29 NOTE — ED Notes (Signed)
Lab made aware of urine culture add-on.

## 2022-11-29 NOTE — ED Notes (Signed)
DNR, MOST form, and stickers sent w/ Pt to IR.

## 2022-11-29 NOTE — ED Notes (Signed)
Pt is aware we need a urine sample.  Pt will call out when she can provide the sample.  IV fluids started.

## 2022-11-29 NOTE — ED Provider Notes (Signed)
[facility] Provider Note   CSN: [id number] Arrival date & time: 11/29/22  [id number]     History  Chief Complaint  Patient presents with   Fever   Tachycardia    [name] is a 72 y.o. female.  Patient with past medical history significant for rheumatoid arthritis, GERD, asthma, diverticulosis, obesity, sarcoidosis not on treatment, recent admission 2/7-2/17/24 for sepsis secondary to UTI, ESBL E. coli bacteremia and right-sided pyelonephritis -- presents from her senior living facility [facility]) today for evaluation of fever patient states that yesterday she started "feeling warm" all day and developed a scratchy throat.  With her previous UTIs she felt generally unwell.  She states with that her temperature was higher and she was having more difficulty thinking.  She denies cough.  She reports possible sick contacts where she lives, as she eats in an area with multiple residents.  She denies vomiting or diarrhea.  No skin rashes.  She has been on a blood thinner and methotrexate in the past, but does not think that she had this restarted since being out of the hospital.       Home Medications Prior to Admission medications   Medication Sig Start Date End Date Taking? Authorizing Provider  acetaminophen (TYLENOL) 500 MG tablet Take 1,000 mg by mouth in the morning and at bedtime.    [provider]  albuterol (VENTOLIN HFA) 108 (90 Base) MCG/ACT inhaler TAKE 2 PUFFS BY MOUTH EVERY 6 HOURS AS NEEDED Patient taking differently: Inhale 2 puffs into the lungs every 6 (six) hours as needed for wheezing or shortness of breath. TAKE 2 PUFFS BY MOUTH EVERY 6 HOURS AS NEEDED 06/26/22   [name], MD  alendronate (FOSAMAX) 70 MG tablet Take 70 mg by mouth every Monday.    [provider]  amoxicillin (AMOXIL) 500 MG capsule Take 2,000 mg by mouth See admin instructions. Take '2000mg'$  by mouth 1 hour before appt.     [provider]  cyanocobalamin (VITAMIN B12) 1000 MCG/ML injection Inject 1,000 mcg into the muscle every 30 (thirty) days. 10/31/22   [provider]  cycloSPORINE (RESTASIS) 0.05 % ophthalmic emulsion Place 1 drop into both eyes 2 (two) times daily.    [provider]  DULoxetine (CYMBALTA) 30 MG capsule Take 1 capsule (30 mg total) by mouth daily. 11/19/22 01/18/23  Bonnell Public, MD  fluticasone (FLONASE) 50 MCG/ACT nasal spray Place 1 spray into both nostrils daily. 08/17/21   [name], MD  folic acid (FOLVITE) 1 MG tablet Take 1 mg by mouth daily. 10/31/22   [provider]  gabapentin (NEURONTIN) 300 MG capsule Take 300 mg by mouth 2 (two) times daily.    [provider]  HUMIRA, 2 PEN, 40 MG/0.8ML PNKT Inject into the skin See admin instructions. Give every two weeks. Will be administered by home health.    [provider]  HYDROcodone-acetaminophen (NORCO/VICODIN) 5-325 MG tablet Take 1 tablet by mouth every 8 (eight) hours as needed for moderate pain or severe pain. 08/29/22   [provider]  levocetirizine (XYZAL ALLERGY 24HR) 5 MG tablet Take 1 tablet (5 mg total) by mouth every evening. 08/17/21   [name], MD  methotrexate (RHEUMATREX) 2.5 MG tablet Take 20 mg by mouth every Wednesday. 06/13/22   [provider]  pantoprazole (PROTONIX) 40 MG tablet Take 1 tablet (40 mg total) by mouth daily. 11/19/22 12/19/22  Dana Allan  I, MD  polyethylene glycol powder (GLYCOLAX/MIRALAX) 17 GM/SCOOP powder Take 17 g by mouth daily as needed. Patient not taking: Reported on 11/09/2022 08/17/21   [name], MD  predniSONE (DELTASONE) 5 MG tablet Take 5 mg by mouth daily.    [provider]  psyllium (HYDROCIL/METAMUCIL) 95 % PACK Take 1 packet by mouth daily. 11/19/22 12/19/22  Bonnell Public, MD  Vitamin D, Ergocalciferol, (DRISDOL) 1.25 MG (50000 UNIT) CAPS capsule Take 1 capsule (50,000  Units total) by mouth every 7 (seven) days. Patient taking differently: Take 50,000 Units by mouth every Wednesday. 05/26/21   [name], MD  WIXELA INHUB 250-50 MCG/ACT AEPB INHALE 1 PUFF BY MOUTH TWICE A DAY Patient taking differently: Inhale 1 puff into the lungs in the morning and at bedtime. 06/07/21   [name], MD      Allergies    Invanz [ertapenem], Ibuprofen, and Sulfa antibiotics    Review of Systems   Review of Systems  Physical Exam Updated Vital Signs BP (!) 163/110 (BP Location: Right Arm)   Pulse (!) 101   Temp 100 F (37.8 C) (Rectal)   Resp (!) 30   Ht 5' (1.524 m)   Wt 83.9 kg   SpO2 95%   BMI 36.13 kg/m   Physical Exam Vitals and nursing note reviewed.  Constitutional:      General: She is not in acute distress.    Appearance: She is well-developed.  HENT:     Head: Normocephalic and atraumatic.     Right Ear: External ear normal.     Left Ear: External ear normal.     Nose: Nose normal.     Mouth/Throat:     Mouth: Mucous membranes are moist.  Eyes:     Conjunctiva/sclera: Conjunctivae normal.  Cardiovascular:     Rate and Rhythm: Regular rhythm. Tachycardia present.     Heart sounds: No murmur heard.    Comments: Mild tachycardia around 100 Pulmonary:     Effort: No respiratory distress.     Breath sounds: No wheezing, rhonchi or rales.  Abdominal:     Palpations: Abdomen is soft.     Tenderness: There is no abdominal tenderness. There is no guarding or rebound.  Musculoskeletal:     Cervical back: Normal range of motion and neck supple.     Right lower leg: No edema.     Left lower leg: No edema.  Skin:    General: Skin is warm and dry.     Findings: No rash.  Neurological:     General: No focal deficit present.     Mental Status: She is alert. Mental status is at baseline.     Motor: No weakness.  Psychiatric:        Mood and Affect: Mood normal.     ED Results / Procedures / Treatments   Labs (all labs ordered are  listed, but only abnormal results are displayed) Labs Reviewed  LACTIC ACID, PLASMA - Abnormal; Notable for the following components:      Result Value   Lactic Acid, Venous 2.2 (*)    All other components within normal limits  COMPREHENSIVE METABOLIC PANEL - Abnormal; Notable for the following components:   Potassium 3.2 (*)    Glucose, Bld 134 (*)    BUN 5 (*)    Calcium 8.3 (*)    Total Protein 6.0 (*)    Albumin 2.9 (*)    All other components within normal  limits  CBC WITH DIFFERENTIAL/PLATELET - Abnormal; Notable for the following components:   RBC 3.63 (*)    Hemoglobin 10.7 (*)    HCT 35.3 (*)    RDW 16.4 (*)    Platelets 404 (*)    All other components within normal limits  URINALYSIS, W/ REFLEX TO CULTURE (INFECTION SUSPECTED) - Abnormal; Notable for the following components:   APPearance CLOUDY (*)    Hgb urine dipstick TRACE (*)    Leukocytes,Ua SMALL (*)    Bacteria, UA MANY (*)    All other components within normal limits  RESP PANEL BY RT-PCR (RSV, FLU A&B, COVID)  [id number]  CULTURE, BLOOD (ROUTINE X 2)  CULTURE, BLOOD (ROUTINE X 2)  URINE CULTURE  LACTIC ACID, PLASMA  PROTIME-INR  APTT    EKG EKG Interpretation  Date/Time:  Wednesday November 29 2022 06:00:37 EST Ventricular Rate:  102 PR Interval:  153 QRS Duration: 90 QT Interval:  326 QTC Calculation: 425 R Axis:   -15 Text Interpretation: Sinus tachycardia Borderline left axis deviation Nonspecific ST abnormality No significant change since last tracing Confirmed by Regan Lemming ([id number]) on 11/29/2022 9:50:33 AM  Radiology DG Chest Port 1 View  Result Date: 11/29/2022 CLINICAL DATA:  Questionable sepsis.  Fever and tachycardia EXAM: PORTABLE CHEST 1 VIEW COMPARISON:  11/08/2022 FINDINGS: Artifact from EKG leads. Normal heart size and mediastinal contours. No acute infiltrate or edema. No effusion or pneumothorax. No acute osseous findings. IMPRESSION: No evidence of active disease. Electronically  Signed   By: Jorje Guild M.D.   On: 11/29/2022 06:35    Procedures Procedures    Medications Ordered in ED Medications - No data to display  ED Course/ Medical Decision Making/ A&P    Patient seen and examined. History obtained directly from patient.   Labs/EKG: Ordered sepsis workup.  Imaging: Ordered chest x-ray.  Medications/Fluids: Ordered: None ordered.   Most recent vital signs reviewed and are as follows: BP (!) 163/110 (BP Location: Right Arm)   Pulse (!) 101   Temp 100 F (37.8 C) (Rectal)   Resp (!) 30   Ht 5' (1.524 m)   Wt 83.9 kg   SpO2 95%   BMI 36.13 kg/m   Initial impression: Febrile illness, unclear etiology  10:01 AM Reassessment performed. Patient appears stable.  She has had some coffee to drink.  Labs personally reviewed and interpreted including: CBC with blood cell count 5.4, hemoglobin 10.7; CMP with mild hypokalemia 3.2 repletion ordered, normal kidney function, normal transaminases; lactate 2.2 improved to 1.0 after IV fluid bolus; clean-catch UA with many bacteria, 6-10 white blood cells, culture added; INR and APTT ordered to evaluate for endorgan damage in setting of sepsis were normal.  Respiratory panel negative.  Imaging personally visualized and interpreted including: Chest x-ray, agree no infiltrates  Reviewed pertinent lab work and imaging with patient at bedside. Questions answered.   Most current vital signs reviewed and are as follows: BP (!) 159/93   Pulse 90   Temp 98 F (36.7 C) (Oral)   Resp (!) 21   Ht 5' (1.524 m)   Wt 83.9 kg   SpO2 98%   BMI 36.13 kg/m   Plan: Admit to hospital.  Will restart on IV meropenem, pending culture reports.  Patient discussed with Dr. Armandina Gemma.   10:14 AM I consulted with Dr. Linda Hedges by telephone. He is calling patient's facility to inquire about treating patient at her facility.   Hospitalist admitting.  Medical Decision Making  Patient with  positive SIRS criteria and concern for recurrent UTI.  Patient is high risk due to recent infection with ESBL pyelonephritis.  Cultures pending.  Otherwise no signs of pneumonia, viral infection.  Abdomen is soft and nontender.  No signs of cellulitis.        Final Clinical Impression(s) / ED Diagnoses Final diagnoses:  Fever, unspecified fever cause  Acute cystitis without hematuria    Rx / DC Orders ED Discharge Orders     None         Carlisle Cater, PA-C 11/29/22 1042    Regan Lemming, MD 11/29/22 1256

## 2022-11-29 NOTE — Plan of Care (Signed)
  Problem: Education: Goal: Knowledge of General Education information will improve Description: Including pain rating scale, medication(s)/side effects and non-pharmacologic comfort measures Outcome: Progressing   Problem: Safety: Goal: Ability to remain free from injury will improve Outcome: Progressing   

## 2022-11-29 NOTE — ED Notes (Signed)
ED TO INPATIENT HANDOFF REPORT  ED Nurse Name and Phone #: [phone]  S Name/Age/Gender [name] 72 y.o. female Room/Bed: 008C/008C  Code Status   Code Status: Full Code  Home/SNF/Other Home Patient oriented to: self, place, time, and situation Is this baseline? Yes   Triage Complete: Triage complete  Chief Complaint UTI (urinary tract infection) [N39.0]  Triage Note Pt BIB [facility] from [facility] due to feeling "hot to the touch" and tachycardic. Was on antibiotics which was finished on 2/17 for UTI.  Pt c/o headache and slight cough that started yesterday. EMS gave 650 mg of oral Tylenol.     Allergies Allergies  Allergen Reactions   Invanz [Ertapenem] Swelling    "Lips swelled up"   Ibuprofen Rash   Sulfa Antibiotics Rash    Level of Care/Admitting Diagnosis ED Disposition     ED Disposition  Admit   Condition  --   [place]: [facility] [[id number]]  Level of Care: Med-Surg [16]  May admit patient to [facility] or [facility] if equivalent level of care is available:: Yes  Covid Evaluation: Confirmed COVID Negative  Diagnosis: UTI (urinary tract infection) [id number]  Admitting Physician: Neena Rhymes [[id number]]  Attending Physician: Neena Rhymes [id number]  Certification:: I certify this patient will need inpatient services for at least 2 midnights          B Medical/Surgery History Past Medical History:  Diagnosis Date   Acute bronchitis    Allergy    Anxiety    Arthritis    knees   Asthma    Backache, unspecified    Cataract    removed both eyes   Depression    Diverticulitis    Diverticulosis of colon (without mention of hemorrhage)    Edema    GERD (gastroesophageal reflux disease)    Glaucoma    Lumbago    Obesity    Other chest pain    Pain in joint, ankle and foot    Sarcoidosis    Tibialis tendinitis    Unspecified asthma(493.90)    Past Surgical History:   Procedure Laterality Date   ABDOMINAL HYSTERECTOMY     CATARACT EXTRACTION W/PHACO Right 06/24/2014   Procedure: CATARACT EXTRACTION PHACO AND INTRAOCULAR LENS PLACEMENT ([place]) RIGHT EYE WITH GONIOSYNECHIALYSIS;  Surgeon: Marylynn Pearson, MD;  Location: [facility];  Service: Ophthalmology;  Laterality: Right;   COLONOSCOPY     EYE SURGERY Left    cataract surgery   HAND SURGERY Right    trigger finger release   IR RADIOLOGIST EVAL & MGMT  11/14/2018   KNEE ARTHROSCOPY Right    TOTAL KNEE ARTHROPLASTY Right 12/08/2021   Procedure: RIGHT TOTAL KNEE ARTHROPLASTY APPLICATION OF WOUND VAC;  Surgeon: Meredith Pel, MD;  Location: [facility];  Service: Orthopedics;  Laterality: Right;   TOTAL KNEE ARTHROPLASTY Left 04/13/2022   Procedure: LEFT TOTAL KNEE ARTHROPLASTY;  Surgeon: Meredith Pel, MD;  Location: [facility];  Service: Orthopedics;  Laterality: Left;     A IV Location/Drains/Wounds Patient Lines/Drains/Airways Status     Active Line/Drains/Airways     Name Placement date Placement time Site Days   Peripheral IV 11/29/22 20 G Posterior;Right Forearm 11/29/22  0619  Forearm  less than 1   Negative Pressure Wound Therapy Knee Left 04/13/22  1901  --  230            Intake/Output Last 24 hours No intake  or output data in the 24 hours ending 11/29/22 1316  Labs/Imaging Results for orders placed or performed during the hospital encounter of 11/29/22 (from the past 48 hour(s))  Resp panel by RT-PCR (RSV, Flu A&B, Covid) Anterior Nasal Swab     Status: None   Collection Time: 11/29/22  6:10 AM   Specimen: Anterior Nasal Swab  Result Value Ref Range   SARS Coronavirus 2 by RT PCR NEGATIVE NEGATIVE   Influenza A by PCR NEGATIVE NEGATIVE   Influenza B by PCR NEGATIVE NEGATIVE    Comment: (NOTE) The Xpert Xpress SARS-CoV-2/FLU/RSV plus assay is intended as an aid in the diagnosis of influenza from Nasopharyngeal swab specimens and should not be used as a sole basis for treatment. Nasal  washings and aspirates are unacceptable for Xpert Xpress SARS-CoV-2/FLU/RSV testing.  Fact Sheet for Patients: EntrepreneurPulse.com.au  Fact Sheet for Healthcare Providers: IncredibleEmployment.be  This test is not yet approved or cleared by the [place] FDA and has been authorized for detection and/or diagnosis of SARS-CoV-2 by FDA under an Emergency Use Authorization (EUA). This EUA will remain in effect (meaning this test can be used) for the duration of the COVID-19 declaration under Section 564(b)(1) of the Act, 21 U.S.C. section 360bbb-3(b)(1), unless the authorization is terminated or revoked.     Resp Syncytial Virus by PCR NEGATIVE NEGATIVE    Comment: (NOTE) Fact Sheet for Patients: EntrepreneurPulse.com.au  Fact Sheet for Healthcare Providers: IncredibleEmployment.be  This test is not yet approved or cleared by the [place] FDA and has been authorized for detection and/or diagnosis of SARS-CoV-2 by FDA under an Emergency Use Authorization (EUA). This EUA will remain in effect (meaning this test can be used) for the duration of the COVID-19 declaration under Section 564(b)(1) of the Act, 21 U.S.C. section 360bbb-3(b)(1), unless the authorization is terminated or revoked.  Performed at [facility], [place] [address] [place], [place] [address]   Comprehensive metabolic panel     Status: Abnormal   Collection Time: 11/29/22  6:13 AM  Result Value Ref Range   Sodium 140 135 - 145 mmol/L   Potassium 3.2 (L) 3.5 - 5.1 mmol/L   Chloride 104 98 - 111 mmol/L   CO2 24 22 - 32 mmol/L   Glucose, Bld 134 (H) 70 - 99 mg/dL    Comment: Glucose reference range applies only to samples taken after fasting for at least 8 hours.   BUN 5 (L) 8 - 23 mg/dL   Creatinine, Ser 0.58 0.44 - 1.00 mg/dL   Calcium 8.3 (L) 8.9 - 10.3 mg/dL   Total Protein 6.0 (L) 6.5 - 8.1 g/dL   Albumin 2.9 (L) 3.5 -  5.0 g/dL   AST 22 15 - 41 U/L   ALT 11 0 - 44 U/L   Alkaline Phosphatase 91 38 - 126 U/L   Total Bilirubin 0.5 0.3 - 1.2 mg/dL   GFR, Estimated >60 >60 mL/min    Comment: (NOTE) Calculated using the CKD-EPI Creatinine Equation (2021)    Anion gap 12 5 - 15    Comment: Performed at [facility], [place] [address], [place], [place] [address]  CBC with Differential     Status: Abnormal   Collection Time: 11/29/22  6:13 AM  Result Value Ref Range   WBC 5.4 4.0 - 10.5 K/uL   RBC 3.63 (L) 3.87 - 5.11 MIL/uL   Hemoglobin 10.7 (L) 12.0 - 15.0 g/dL   HCT 35.3 (L) 36.0 - 46.0 %  MCV 97.2 80.0 - 100.0 fL   MCH 29.5 26.0 - 34.0 pg   MCHC 30.3 30.0 - 36.0 g/dL   RDW 16.4 (H) 11.5 - 15.5 %   Platelets 404 (H) 150 - 400 K/uL   nRBC 0.0 0.0 - 0.2 %   Neutrophils Relative % 45 %   Neutro Abs 2.5 1.7 - 7.7 K/uL   Lymphocytes Relative 35 %   Lymphs Abs 1.9 0.7 - 4.0 K/uL   Monocytes Relative 12 %   Monocytes Absolute 0.7 0.1 - 1.0 K/uL   Eosinophils Relative 6 %   Eosinophils Absolute 0.3 0.0 - 0.5 K/uL   Basophils Relative 1 %   Basophils Absolute 0.0 0.0 - 0.1 K/uL   Immature Granulocytes 1 %   Abs Immature Granulocytes 0.04 0.00 - 0.07 K/uL    Comment: Performed at [facility] [address], [place], [place] [address]  Protime-INR     Status: None   Collection Time: 11/29/22  6:13 AM  Result Value Ref Range   Prothrombin Time 13.6 11.4 - 15.2 seconds   INR 1.1 0.8 - 1.2    Comment: (NOTE) INR goal varies based on device and disease states. Performed at [facility], [place] [address], [place], [place] [address]   APTT     Status: None   Collection Time: 11/29/22  6:13 AM  Result Value Ref Range   aPTT 29 24 - 36 seconds    Comment: Performed at [facility] [address] [place], [place] [address]  Lactic acid, plasma     Status: Abnormal   Collection Time: 11/29/22  6:18 AM  Result Value Ref Range   Lactic Acid, Venous 2.2 (HH) 0.5 - 1.9 mmol/L     Comment: CRITICAL RESULT CALLED TO, READ BACK BY AND VERIFIED WITH A,NEWTON,RN. [id number] 11/29/22. LPAIT Performed at [facility], [place] [address] [place], [place] [address]   Lactic acid, plasma     Status: None   Collection Time: 11/29/22  7:51 AM  Result Value Ref Range   Lactic Acid, Venous 1.0 0.5 - 1.9 mmol/L    Comment: Performed at [facility] [address], [place], [place] [address]  Urinalysis, w/ Reflex to Culture (Infection Suspected) -Urine, Clean Catch     Status: Abnormal   Collection Time: 11/29/22  8:50 AM  Result Value Ref Range   Specimen Source URINE, CLEAN CATCH    Color, Urine YELLOW YELLOW   APPearance CLOUDY (A) CLEAR   Specific Gravity, Urine 1.010 1.005 - 1.030   pH 6.5 5.0 - 8.0   Glucose, UA NEGATIVE NEGATIVE mg/dL   Hgb urine dipstick TRACE (A) NEGATIVE   Bilirubin Urine NEGATIVE NEGATIVE   Ketones, ur NEGATIVE NEGATIVE mg/dL   Protein, ur NEGATIVE NEGATIVE mg/dL   Nitrite NEGATIVE NEGATIVE   Leukocytes,Ua SMALL (A) NEGATIVE   Squamous Epithelial / HPF 0-5 0 - 5 /HPF   WBC, UA 6-10 0 - 5 WBC/hpf    Comment: Reflex urine culture not performed if WBC <=10, OR if Squamous epithelial cells >5. If Squamous epithelial cells >5, suggest recollection.   RBC / HPF 0-5 0 - 5 RBC/hpf   Bacteria, UA MANY (A) NONE SEEN   Mucus PRESENT     Comment: Performed at [facility], [place] [address] [place], [place] [address]   DG Chest Port 1 View  Result Date: 11/29/2022 CLINICAL DATA:  Questionable sepsis.  Fever  and tachycardia EXAM: PORTABLE CHEST 1 VIEW COMPARISON:  11/08/2022 FINDINGS: Artifact from EKG leads. Normal heart size and mediastinal contours. No acute infiltrate or edema. No effusion or pneumothorax. No acute osseous findings. IMPRESSION: No evidence of active disease. Electronically Signed   By: Jorje Guild M.D.   On: 11/29/2022 06:35    Pending Labs Unresulted Labs (From admission, onward)     Start     Ordered   12/06/22 0500   Creatinine, serum  (enoxaparin (LOVENOX)    CrCl >/= 30 ml/min)  Weekly,   R     Comments: while on enoxaparin therapy    11/29/22 1041   11/30/22 [id number]  Basic metabolic panel  Tomorrow morning,   R        11/29/22 1041   11/29/22 0935  Urine Culture (for pregnant, neutropenic or urologic patients or patients with an indwelling urinary catheter)  (Urine Labs)  Once,   URGENT       Question:  Indication  Answer:  Sepsis   11/29/22 0934   11/29/22 0607  Blood Culture (routine x 2)  (Undifferentiated presentation (screening labs and basic nursing orders))  BLOOD CULTURE X 2,   STAT      11/29/22 0607            Vitals/Pain Today's Vitals   11/29/22 0900 11/29/22 1000 11/29/22 1015 11/29/22 1100  BP: (!) 159/93 (!) 172/96  (!) 165/115  Pulse: 90 87 86 84  Resp: (!) 21 (!) 21 20 (!) 21  Temp:      TempSrc:      SpO2: 98% 97% 97% 100%  Weight:      Height:      PainSc:        Isolation Precautions No active isolations  Medications Medications  meropenem (MERREM) 1 g in sodium chloride 0.9 % 100 mL IVPB (0 g Intravenous Stopped 11/29/22 1158)  acetaminophen (TYLENOL) tablet 1,000 mg (has no administration in time range)  HYDROcodone-acetaminophen (NORCO/VICODIN) 5-325 MG per tablet 1 tablet (has no administration in time range)  methotrexate (RHEUMATREX) tablet 20 mg (has no administration in time range)  DULoxetine (CYMBALTA) DR capsule 30 mg (30 mg Oral Given 11/29/22 1310)  predniSONE (DELTASONE) tablet 5 mg (5 mg Oral Given 11/29/22 1310)  pantoprazole (PROTONIX) EC tablet 40 mg (40 mg Oral Given 11/29/22 1310)  psyllium (HYDROCIL/METAMUCIL) 1 packet (has no administration in time range)  folic acid (FOLVITE) tablet 1 mg (1 mg Oral Given 11/29/22 1310)  gabapentin (NEURONTIN) capsule 300 mg (300 mg Oral Given 11/29/22 1310)  Vitamin D (Ergocalciferol) (DRISDOL) 1.25 MG (50000 UNIT) capsule 50,000 Units (has no administration in time range)  albuterol (PROVENTIL) (2.5 MG/3ML)  0.083% nebulizer solution 2.5 mg (has no administration in time range)  fluticasone (FLONASE) 50 MCG/ACT nasal spray 1 spray (has no administration in time range)  levocetirizine (XYZAL) tablet 5 mg (has no administration in time range)  mometasone-formoterol (DULERA) 200-5 MCG/ACT inhaler 2 puff (has no administration in time range)  cycloSPORINE (RESTASIS) 0.05 % ophthalmic emulsion 1 drop (has no administration in time range)  0.45 % sodium chloride infusion (has no administration in time range)  enoxaparin (LOVENOX) injection 40 mg (40 mg Subcutaneous Given 11/29/22 1312)  zolpidem (AMBIEN) tablet 5 mg (has no administration in time range)  senna (SENOKOT) tablet 8.6 mg (8.6 mg Oral Given 11/29/22 1310)  sodium chloride 0.9 % bolus 1,000 mL (0 mLs Intravenous Stopped 11/29/22 0944)  potassium chloride SA (KLOR-CON M) CR  tablet 40 mEq (40 mEq Oral Given 11/29/22 1113)    Mobility walks with device     Focused Assessments Cardiac Assessment Handoff:    Lab Results  Component Value Date   CKTOTAL 1,460 (H) 08/23/2021   No results found for: "DDIMER" Does the Patient currently have chest pain? No    R Recommendations: See Admitting Provider Note  Report given to:   Additional Notes: NONE

## 2022-11-29 NOTE — Subjective & Objective (Addendum)
[name], a 72 y/o with a past medical history significant for rheumatoid arthritis, GERD, asthma, diverticulosis, obesity, sarcoidosis not on treatment, recent admission 2/7-2/17/24 for sepsis secondary to UTI, ESBL E. coli bacteremia and right-sided pyelonephritis -- presents from her senior living facility [facility]) today for evaluation of fever patient states that yesterday she started "feeling warm" all day and developed a scratchy throat. She is transported to [facility] for further evaluation.

## 2022-11-30 DIAGNOSIS — A498 Other bacterial infections of unspecified site: Secondary | ICD-10-CM | POA: Diagnosis not present

## 2022-11-30 DIAGNOSIS — Z1612 Extended spectrum beta lactamase (ESBL) resistance: Secondary | ICD-10-CM | POA: Diagnosis not present

## 2022-11-30 LAB — BASIC METABOLIC PANEL
Anion gap: 9 (ref 5–15)
BUN: <5 mg/dL — ABNORMAL LOW (ref 8–23)
CO2: 26 mmol/L (ref 22–32)
Calcium: 8.2 mg/dL — ABNORMAL LOW (ref 8.9–10.3)
Chloride: 106 mmol/L (ref 98–111)
Creatinine, Ser: 0.46 mg/dL (ref 0.44–1.00)
GFR, Estimated: >60 mL/min (ref >60)
Glucose, Bld: 94 mg/dL (ref 70–99)
Potassium: 3.6 mmol/L (ref 3.5–5.1)
Sodium: 141 mmol/L (ref 135–145)

## 2022-11-30 LAB — URINE CULTURE

## 2022-11-30 MED ORDER — AMLODIPINE BESYLATE 5 MG PO TABS
5.0000 mg | ORAL_TABLET | Freq: Every day | ORAL | Status: DC
  Administered 2022-11-30 – 2022-12-01 (×2): 5 mg via ORAL
  Filled 2022-11-30 (×2): qty 1

## 2022-11-30 NOTE — Progress Notes (Signed)
PROGRESS NOTE  [name]  [id number] DOB: 1951/05/29 DOA: 11/29/2022 PCP: [name], MD   Brief Narrative: Patient is a 72 year old female with history of rheumatoid arthritis, GERD, asthma, diverticulosis, obesity, sarcoidosis not on treatment, recently admitted for sepsis secondary to UTI/ESBL E. coli bacteremia, right-sided pyelonephritis presented [facility] of fever.  On presentation she was mildly febrile, hypotensive, sinus tachycardic.  Lab work showed potassium 3.2, lactate of 2.2.  UA showed cloudy, many bacteria, mild leukocytes in the urine.  Started on meropenem.  Assessment & Plan:  Active Problems:   Infection due to ESBL-producing Escherichia coli   Rheumatoid arthritis ([place])   Asthma  Sepsis: Presented with fever, lactic acidosis, tachycardia.  Recent history of ESBL gram-negative bacteremia secondary to UTI/pyelonephritis.  She was treated with 7 days of IV meropenem, final blood cultures were clear on discharge.  Continue meropenem for now.  UA showed some bacteria, few leukocytes. Urine culture showed here multiple species, we will resend  Will consult ID if urine culture comes out to be positive for ESBL E. Coli. No leukocytosis, lactic acidosis has resolved.  Rheumatoid arthritis: Currently on treatment with methotrexate, prednisone, Humira.  Currently not in acute exacerbation.  Asthma: Currently stable without any respiratory symptoms.  Continue home regimen  Hypertension: Remains hypertensive here.  Not on any medications at home.  Started on amlodipine 5 mg daily.  Hypokalemia: Supplemented and corrected         DVT prophylaxis:enoxaparin (LOVENOX) injection 40 mg Start: 11/29/22 1045     Code Status: Full Code  Family Communication: None at bedside  Patient status:Inpatient  Patient is from :[facility]  Anticipated discharge [id number] living facility  Estimated DC date:1-2  days   Consultants: None  Procedures:None  Antimicrobials:  Anti-infectives (From admission, onward)    Start     Dose/Rate Route Frequency Ordered Stop   11/29/22 1015  meropenem (MERREM) 1 g in sodium chloride 0.9 % 100 mL IVPB        1 g 200 mL/hr over 30 Minutes Intravenous Every 8 hours 11/29/22 1008         Subjective: Patient seen and examined at bedside today.  Hemodynamically stable.  Afebrile.  Denies any dysuria or feeling hot like yesterday.  Comfortable,lying on bed  Objective: Vitals:   11/29/22 1501 11/29/22 1635 11/29/22 2000 11/30/22 0501  BP: (!) 158/93 (!) 166/89 (!) 167/84 (!) 157/82  Pulse: 87 100 (!) 108 98  Resp: '16 18 17 17  '$ Temp: 98.3 F (36.8 C) 98.5 F (36.9 C) 98.4 F (36.9 C) 98.2 F (36.8 C)  TempSrc: Oral Oral Oral   SpO2: 99% 96% 95% 92%  Weight:      Height:        Intake/Output Summary (Last 24 hours) at 11/30/2022 0736 Last data filed at 11/30/2022 0000 Gross per 24 hour  Intake 237 ml  Output 700 ml  Net -463 ml   Filed Weights   11/29/22 0602  Weight: 83.9 kg    Examination:  General exam: Overall comfortable, not in distress HEENT: PERRL Respiratory system:  no wheezes or crackles  Cardiovascular system: S1 & S2 heard, RRR.  Gastrointestinal system: Abdomen is nondistended, soft and nontender. Central nervous system: Alert and oriented Extremities: No edema, no clubbing ,no cyanosis Skin: No rashes, no ulcers,no icterus     Data Reviewed: I have personally reviewed following labs and imaging studies  CBC: Recent Labs  Lab 11/29/22  [id number]  WBC 5.4  NEUTROABS 2.5  HGB 10.7*  HCT 35.3*  MCV 97.2  PLT [id number]*   Basic Metabolic Panel: Recent Labs  Lab 11/29/22 0613 11/30/22 0457  NA 140 141  K 3.2* 3.6  CL 104 106  CO2 24 26  GLUCOSE 134* 94  BUN 5* <5*  CREATININE 0.58 0.46  CALCIUM 8.3* 8.2*     Recent Results (from the past 240 hour(s))  Resp panel by RT-PCR (RSV, Flu A&B, Covid) Anterior Nasal  Swab     Status: None   Collection Time: 11/29/22  6:10 AM   Specimen: Anterior Nasal Swab  Result Value Ref Range Status   SARS Coronavirus 2 by RT PCR NEGATIVE NEGATIVE Final   Influenza A by PCR NEGATIVE NEGATIVE Final   Influenza B by PCR NEGATIVE NEGATIVE Final    Comment: (NOTE) The Xpert Xpress SARS-CoV-2/FLU/RSV plus assay is intended as an aid in the diagnosis of influenza from Nasopharyngeal swab specimens and should not be used as a sole basis for treatment. Nasal washings and aspirates are unacceptable for Xpert Xpress SARS-CoV-2/FLU/RSV testing.  Fact Sheet for Patients: EntrepreneurPulse.com.au  Fact Sheet for Healthcare Providers: IncredibleEmployment.be  This test is not yet approved or cleared by the [place] FDA and has been authorized for detection and/or diagnosis of SARS-CoV-2 by FDA under an Emergency Use Authorization (EUA). This EUA will remain in effect (meaning this test can be used) for the duration of the COVID-19 declaration under Section 564(b)(1) of the Act, 21 U.S.C. section 360bbb-3(b)(1), unless the authorization is terminated or revoked.     Resp Syncytial Virus by PCR NEGATIVE NEGATIVE Final    Comment: (NOTE) Fact Sheet for Patients: EntrepreneurPulse.com.au  Fact Sheet for Healthcare Providers: IncredibleEmployment.be  This test is not yet approved or cleared by the [place] FDA and has been authorized for detection and/or diagnosis of SARS-CoV-2 by FDA under an Emergency Use Authorization (EUA). This EUA will remain in effect (meaning this test can be used) for the duration of the COVID-19 declaration under Section 564(b)(1) of the Act, 21 U.S.C. section 360bbb-3(b)(1), unless the authorization is terminated or revoked.  Performed at [facility], [place] [address], [place], [place] [address]      Radiology Studies: DG Chest Port 1 View  Result  Date: 11/29/2022 CLINICAL DATA:  Questionable sepsis.  Fever and tachycardia EXAM: PORTABLE CHEST 1 VIEW COMPARISON:  11/08/2022 FINDINGS: Artifact from EKG leads. Normal heart size and mediastinal contours. No acute infiltrate or edema. No effusion or pneumothorax. No acute osseous findings. IMPRESSION: No evidence of active disease. Electronically Signed   By: Jorje Guild M.D.   On: 11/29/2022 06:35    Scheduled Meds:  acetaminophen  1,000 mg Oral BH-qamhs   cycloSPORINE  1 drop Both Eyes BID   DULoxetine  30 mg Oral Daily   enoxaparin (LOVENOX) injection  40 mg Subcutaneous Q24H   fluticasone  1 spray Each Nare Daily   folic acid  1 mg Oral Daily   gabapentin  300 mg Oral BID   loratadine  10 mg Oral QHS   methotrexate  20 mg Oral Q Wed   mometasone-formoterol  2 puff Inhalation BID   pantoprazole  40 mg Oral Daily   predniSONE  5 mg Oral Daily   psyllium  1 packet Oral Daily   senna  1 tablet Oral BID   [START ON 12/06/2022] Vitamin D (Ergocalciferol)  50,000 Units Oral Q Wed   Continuous Infusions:  sodium chloride 50 mL/hr at 11/29/22 1646   meropenem (MERREM) IV 1 g (11/30/22 0242)     LOS: 1 day   Shelly Coss, MD [facility] P2/29/2024, 7:36 AM

## 2022-12-01 DIAGNOSIS — N390 Urinary tract infection, site not specified: Secondary | ICD-10-CM

## 2022-12-01 DIAGNOSIS — A419 Sepsis, unspecified organism: Secondary | ICD-10-CM

## 2022-12-01 MED ORDER — AMLODIPINE BESYLATE 5 MG PO TABS: 5.0000 mg | ORAL_TABLET | Freq: Every day | ORAL | 1 refills | Status: DC

## 2022-12-01 NOTE — Progress Notes (Signed)
Attempted to call report to [employer] via number given by Education officer, museum. Per staff, "this is my cell number." Primary nurse called main [place] number to give report. Per staff, receiving nurse is not present to receive report at this time. Callback number given to staff-verbalized understanding.

## 2022-12-01 NOTE — Discharge Summary (Signed)
Physician Discharge Summary  [name] [id number] DOB: 1951-04-06 DOA: 11/29/2022  PCP: [name], MD  Admit date: 11/29/2022 Discharge date: 12/01/2022  Admitted From: Home Disposition:  Home  Discharge Condition:Stable CODE STATUS:FULL Diet recommendation: Heart Healthy   Brief/Interim Summary: Patient is a 72 year old female with history of rheumatoid arthritis, GERD, asthma, diverticulosis, obesity, sarcoidosis not on treatment, recently admitted for sepsis secondary to UTI/ESBL E. coli bacteremia, right-sided pyelonephritis presented [facility] from [facility] for evaluation of fever.  On presentation she was mildly febrile, hypotensive, sinus tachycardic.  Lab work showed potassium 3.2, lactate of 2.2.  UA showed cloudy, many bacteria, mild leukocytes in the urine.  Started on meropenem.  Urine culture sent twice here, first one showing multiple organisms, second one showing no growth.  Antibiotics will be discontinued.  Medically stable for discharge back to her facility today  Following problems were addressed during the hospitalization:  Sepsis ruled out: Presented with warm feeling,mild lactic acidosis, tachycardia.  Recent history of ESBL gram-negative bacteremia secondary to UTI/pyelonephritis.  She was treated with 7 days of IV meropenem, final blood cultures were clear on discharge. Started on meropenem on admission. UA showed some bacteria, few leukocytes. Urine culture showed here multiple species, repeat did not show any growth. No leukocytosis. lactic acidosis has resolved.  She got meropenem for 3 days, now will be stopped. Sepsis ruled out   Rheumatoid arthritis: Currently on treatment with methotrexate, prednisone, Humira.  Currently not in acute exacerbation.   Asthma: Currently stable without any respiratory symptoms.  Continue home regimen   Hypertension: Remains hypertensive here.  Not on any medications at home.  Started on amlodipine 5 mg  daily.   Hypokalemia: Supplemented and corrected  Discharge Diagnoses:  Active Problems:   Infection due to ESBL-producing Escherichia coli   Rheumatoid arthritis ([place])   Asthma    Discharge Instructions  Discharge Instructions     Diet - low sodium heart healthy   Complete by: As directed    Discharge instructions   Complete by: As directed    1)Please follow-up with your PCP in a week 2)Monitor your blood pressure at home   Increase activity slowly   Complete by: As directed       Allergies as of 12/01/2022       Reactions   Invanz [ertapenem] Swelling   "Lips swelled up"   Ibuprofen Rash   Sulfa Antibiotics Rash        Medication List     TAKE these medications    acetaminophen 500 MG tablet Commonly known as: TYLENOL Take 1,000 mg by mouth in the morning and at bedtime.   albuterol 108 (90 Base) MCG/ACT inhaler Commonly known as: VENTOLIN HFA TAKE 2 PUFFS BY MOUTH EVERY 6 HOURS AS NEEDED What changed:  how much to take how to take this when to take this reasons to take this   alendronate 70 MG tablet Commonly known as: FOSAMAX Take 70 mg by mouth every Monday.   amLODipine 5 MG tablet Commonly known as: NORVASC Take 1 tablet (5 mg total) by mouth daily. Start taking on: December 02, 2022   amoxicillin 500 MG capsule Commonly known as: AMOXIL Take 2,000 mg by mouth See admin instructions. Take '2000mg'$  by mouth 1 hour before appt.   cyanocobalamin 1000 MCG/ML injection Commonly known as: VITAMIN B12 Inject 1,000 mcg into the muscle every 30 (thirty) days.   cycloSPORINE 0.05 % ophthalmic emulsion Commonly known as: RESTASIS Place 1 drop  into both eyes 2 (two) times daily.   DULoxetine 30 MG capsule Commonly known as: CYMBALTA Take 1 capsule (30 mg total) by mouth daily.   fluticasone 50 MCG/ACT nasal spray Commonly known as: FLONASE Place 1 spray into both nostrils daily.   folic acid 1 MG tablet Commonly known as: FOLVITE Take 1 mg by  mouth daily.   gabapentin 300 MG capsule Commonly known as: NEURONTIN Take 300 mg by mouth 2 (two) times daily.   Humira (2 Pen) 40 MG/0.8ML Pnkt Generic drug: Adalimumab Inject into the skin See admin instructions. Give every two weeks. Will be administered by home health.   HYDROcodone-acetaminophen 5-325 MG tablet Commonly known as: NORCO/VICODIN Take 1 tablet by mouth every 8 (eight) hours as needed for moderate pain or severe pain.   levocetirizine 5 MG tablet Commonly known as: Xyzal Allergy 24HR Take 1 tablet (5 mg total) by mouth every evening.   methotrexate 2.5 MG tablet Commonly known as: RHEUMATREX Take 20 mg by mouth every Wednesday.   pantoprazole 40 MG tablet Commonly known as: PROTONIX Take 1 tablet (40 mg total) by mouth daily.   polyethylene glycol powder 17 GM/SCOOP powder Commonly known as: GLYCOLAX/MIRALAX Take 17 g by mouth daily as needed.   predniSONE 5 MG tablet Commonly known as: DELTASONE Take 5 mg by mouth daily.   psyllium 95 % Pack Commonly known as: HYDROCIL/METAMUCIL Take 1 packet by mouth daily.   Vitamin D (Ergocalciferol) 1.25 MG (50000 UNIT) Caps capsule Commonly known as: DRISDOL Take 1 capsule (50,000 Units total) by mouth every 7 (seven) days. What changed: when to take this   Wixela Inhub 250-50 MCG/ACT Aepb Generic drug: fluticasone-salmeterol INHALE 1 PUFF BY MOUTH TWICE A DAY What changed: See the new instructions.        Follow-up Information     [name], MD. Schedule an appointment as soon as possible for a visit in 1 week(s).   Specialty: Family Medicine Contact information: [place] [address] [phone]                Allergies  Allergen Reactions   Invanz [Ertapenem] Swelling    "Lips swelled up"   Ibuprofen Rash   Sulfa Antibiotics Rash    Consultations: None   Procedures/Studies: DG Chest Port 1 View  Result Date: 11/29/2022 CLINICAL DATA:   Questionable sepsis.  Fever and tachycardia EXAM: PORTABLE CHEST 1 VIEW COMPARISON:  11/08/2022 FINDINGS: Artifact from EKG leads. Normal heart size and mediastinal contours. No acute infiltrate or edema. No effusion or pneumothorax. No acute osseous findings. IMPRESSION: No evidence of active disease. Electronically Signed   By: Jorje Guild M.D.   On: 11/29/2022 06:35   DG FLUORO GUIDED NEEDLE PLC ASPIRATION/INJECTION LOC  Result Date: 11/10/2022 CLINICAL DATA:  Left elbow pain since EMG testing last month, effusion on plain film, on Humira, admitted with UTI bacteremia EXAM: LEFT ELBOW ASPIRATION UNDER FLUOROSCOPY FLUOROSCOPY: 0.70 mGy TECHNIQUE: The procedure, risks (including but not limited to bleeding, infection, structural damage, pain, inability to access the joint), benefits, and alternatives were explained to the patient. Questions regarding the procedure were encouraged and answered. The patient understands and consents to the procedure. An appropriate skin entry site was determined under fluoroscopy. Site was marked, prepped and draped in usual sterile fashion, and infiltrated locally with 1% lidocaine. A 20 gauge needle was advanced into the joint. No synovial fluid was acquired. A small amount of contrast was injected to demonstrate  intra-articular placement.A fluoroscopic image was saved for documentation.The needle was removed and site bandaged. The patient tolerated procedure well. COMPLICATIONS: None immediate IMPRESSION: Fluoro guided aspiration of left elbow without synovial fluid obtained via 20 gauge needle despite confirmed intra-articular needle placement. Given evidence of effusion on plain film, should suspicion remain for septic joint, may consider repeat aspiration under [name] to target collection directly. This would need to be arranged with a musculoskeletal radiologist on their scheduled day in the facility. Findings communicated with ordering provider after procedure performed via  [place]. Read and performed by: Alexandria Lodge, PA-C Electronically Signed   By: Van Clines M.D.   On: 11/10/2022 15:16   CT ABDOMEN PELVIS W CONTRAST  Result Date: 11/08/2022 CLINICAL DATA:  Urinary tract infection. EXAM: CT ABDOMEN AND PELVIS WITH CONTRAST TECHNIQUE: Multidetector CT imaging of the abdomen and pelvis was performed using the standard protocol following bolus administration of intravenous contrast. RADIATION DOSE REDUCTION: This exam was performed according to the departmental dose-optimization program which includes automated exposure control, adjustment of the mA and/or kV according to patient size and/or use of iterative reconstruction technique. CONTRAST:  91m OMNIPAQUE IOHEXOL 350 MG/ML SOLN COMPARISON:  CT abdomen pelvis dated 07/12/2018. FINDINGS: Lower chest: The visualized lung bases are clear. No intra-abdominal free air or free fluid. Hepatobiliary: Apparent fatty liver. No biliary dilatation. The gallbladder is unremarkable. Pancreas: Unremarkable. No pancreatic ductal dilatation or surrounding inflammatory changes. Spleen: Normal in size without focal abnormality. Adrenals/Urinary Tract: The adrenal glands unremarkable. Similar appearance of predominantly exophytic angiomyolipoma arising from the inferior pole of the left kidney and measuring up to 4 cm. Small left renal upper pole cyst. Punctate nonobstructing right renal upper pole calculus. There is mild urothelial enhancement of the right ureter and renal collecting system concerning for pyelonephritis. Correlation with urinalysis recommended. No fluid collection or abscess. There is no hydronephrosis on either side. The urinary bladder is unremarkable. Stomach/Bowel: There is colonic diverticulosis without active inflammatory changes. There is moderate stool throughout the colon. There is no bowel obstruction or active inflammation. The appendix is normal. Vascular/Lymphatic: Mild aortoiliac atherosclerotic  disease. The IVC is unremarkable. No portal venous gas. There is no adenopathy. Reproductive: Hysterectomy.  No adnexal masses. Other: Small fat containing umbilical hernia. Musculoskeletal: Degenerative changes of the spine. No acute osseous pathology. IMPRESSION: 1. Findings concerning for right-sided pyelonephritis. Correlation with urinalysis recommended. No hydronephrosis. 2. Punctate nonobstructing right renal upper pole calculus. 3. Colonic diverticulosis. No bowel obstruction. Normal appendix. 4. Similar appearance of predominantly exophytic angiomyolipoma arising from the inferior pole of the left kidney. 5.  Aortic Atherosclerosis (ICD10-I70.0). Electronically Signed   By: AAnner CreteM.D.   On: 11/08/2022 22:03   DG Elbow 2 Views Left  Result Date: 11/08/2022 CLINICAL DATA:  Pain, no known injury EXAM: LEFT ELBOW - 2 VIEW COMPARISON:  None Available. 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 Joint effusion present. Bone fragments are noted anteriorly on the lateral view. Cannot exclude avulsed fragments. No subluxation or dislocation. IMPRESSION: Advanced osteoarthritis. Left elbow joint effusion. Curvilinear bone fragments anteriorly on the lateral view, cannot exclude avulsed fragments. Electronically Signed   By: KRolm BaptiseM.D.   On: 11/08/2022 21:15   DG Chest 2 View  Result Date: 11/08/2022 CLINICAL DATA:  Sepsis and shortness of breath. EXAM: CHEST - 2 VIEW COMPARISON:  06/02/2022 FINDINGS: The heart size and mediastinal contours are within normal limits. There is no evidence of pulmonary  edema, consolidation, pneumothorax, nodule or pleural fluid. The visualized skeletal structures are unremarkable. IMPRESSION: No active cardiopulmonary disease. Electronically Signed   By: Aletta Edouard M.D.   On: 11/08/2022 15:35      Subjective: Patient seen and examined at bedside today.  Hemodynamically stable for discharge.No abdominal  pain, dysuria or fever.  Discharge Exam: Vitals:   12/01/22 0751 12/01/22 0805  BP:  (!) 159/96  Pulse:  91  Resp:  18  Temp:  97.9 F (36.6 C)  SpO2: 97% 98%   Vitals:   11/30/22 2039 12/01/22 0305 12/01/22 0751 12/01/22 0805  BP: (!) 166/99 (!) 142/79  (!) 159/96  Pulse: (!) 104 96  91  Resp: '17 17  18  '$ Temp: 98.3 F (36.8 C) 97.6 F (36.4 C)  97.9 F (36.6 C)  TempSrc: Oral Oral  Oral  SpO2: 96% 97% 97% 98%  Weight:      Height:        General: Pt is alert, awake, not in acute distress Cardiovascular: RRR, S1/S2 +, no rubs, no gallops Respiratory: CTA bilaterally, no wheezing, no rhonchi Abdominal: Soft, NT, ND, bowel sounds + Extremities: no edema, no cyanosis    The results of significant diagnostics from this hospitalization (including imaging, microbiology, ancillary and laboratory) are listed below for reference.     Microbiology: Recent Results (from the past 240 hour(s))  Blood Culture (routine x 2)     Status: None (Preliminary result)   Collection Time: 11/29/22  6:07 AM   Specimen: BLOOD  Result Value Ref Range Status   Specimen Description BLOOD BLOOD LEFT HAND  Final   Special Requests   Final    BOTTLES DRAWN AEROBIC AND ANAEROBIC Blood Culture adequate volume   Culture   Final    NO GROWTH 2 DAYS Performed at [facility], [address], [place], [place] [address]    Report Status PENDING  Incomplete  Resp panel by RT-PCR (RSV, Flu A&B, Covid) Anterior Nasal Swab     Status: None   Collection Time: 11/29/22  6:10 AM   Specimen: Anterior Nasal Swab  Result Value Ref Range Status   SARS Coronavirus 2 by RT PCR NEGATIVE NEGATIVE Final   Influenza A by PCR NEGATIVE NEGATIVE Final   Influenza B by PCR NEGATIVE NEGATIVE Final    Comment: (NOTE) The Xpert Xpress SARS-CoV-2/FLU/RSV plus assay is intended as an aid in the diagnosis of influenza from Nasopharyngeal swab specimens and should not be used as a sole basis for treatment. Nasal  washings and aspirates are unacceptable for Xpert Xpress SARS-CoV-2/FLU/RSV testing.  Fact Sheet for Patients: EntrepreneurPulse.com.au  Fact Sheet for Healthcare Providers: IncredibleEmployment.be  This test is not yet approved or cleared by the [place] FDA and has been authorized for detection and/or diagnosis of SARS-CoV-2 by FDA under an Emergency Use Authorization (EUA). This EUA will remain in effect (meaning this test can be used) for the duration of the COVID-19 declaration under Section 564(b)(1) of the Act, 21 U.S.C. section 360bbb-3(b)(1), unless the authorization is terminated or revoked.     Resp Syncytial Virus by PCR NEGATIVE NEGATIVE Final    Comment: (NOTE) Fact Sheet for Patients: EntrepreneurPulse.com.au  Fact Sheet for Healthcare Providers: IncredibleEmployment.be  This test is not yet approved or cleared by the [place] FDA and has been authorized for detection and/or diagnosis of SARS-CoV-2 by FDA under an Emergency Use Authorization (EUA). This EUA will remain in effect (meaning this test can be  used) for the duration of the COVID-19 declaration under Section 564(b)(1) of the Act, 21 U.S.C. section 360bbb-3(b)(1), unless the authorization is terminated or revoked.  Performed at [facility], [place] [address], [place], [place] [address]   Blood Culture (routine x 2)     Status: None (Preliminary result)   Collection Time: 11/29/22  6:12 AM   Specimen: BLOOD  Result Value Ref Range Status   Specimen Description BLOOD BLOOD RIGHT FOREARM  Final   Special Requests   Final    BOTTLES DRAWN AEROBIC AND ANAEROBIC Blood Culture adequate volume   Culture   Final    NO GROWTH 2 DAYS Performed at [facility], [place] [address] [place], [place] [address]    Report Status PENDING  Incomplete  Urine Culture (for pregnant, neutropenic or urologic patients or patients  with an indwelling urinary catheter)     Status: Abnormal   Collection Time: 11/29/22  9:35 AM   Specimen: Urine, Clean Catch  Result Value Ref Range Status   Specimen Description URINE, CLEAN CATCH  Final   Special Requests   Final    NONE Performed at [facility], [place] [address] [place], [place] [address]    Culture MULTIPLE SPECIES PRESENT, SUGGEST RECOLLECTION (A)  Final   Report Status 11/30/2022 FINAL  Final  Urine Culture (for pregnant, neutropenic or urologic patients or patients with an indwelling urinary catheter)     Status: None (Preliminary result)   Collection Time: 11/30/22 11:01 AM   Specimen: Urine, Clean Catch  Result Value Ref Range Status   Specimen Description URINE, CLEAN CATCH  Final   Special Requests NONE  Final   Culture   Final    NO GROWTH < 24 HOURS Performed at [facility], [place] [address], [place],  [address]    Report Status PENDING  Incomplete     Labs: BNP (last 3 results) No results for input(s): "BNP" in the last 8760 hours. Basic Metabolic Panel: Recent Labs  Lab 11/29/22 0613 11/30/22 0457  NA 140 141  K 3.2* 3.6  CL 104 106  CO2 24 26  GLUCOSE 134* 94  BUN 5* <5*  CREATININE 0.58 0.46  CALCIUM 8.3* 8.2*   Liver Function Tests: Recent Labs  Lab 11/29/22 0613  AST 22  ALT 11  ALKPHOS 91  BILITOT 0.5  PROT 6.0*  ALBUMIN 2.9*   No results for input(s): "LIPASE", "AMYLASE" in the last 168 hours. No results for input(s): "AMMONIA" in the last 168 hours. CBC: Recent Labs  Lab 11/29/22 0613  WBC 5.4  NEUTROABS 2.5  HGB 10.7*  HCT 35.3*  MCV 97.2  PLT 404*   Cardiac Enzymes: No results for input(s): "CKTOTAL", "CKMB", "CKMBINDEX", "TROPONINI" in the last 168 hours. BNP: Invalid input(s): "POCBNP" CBG: No results for input(s): "GLUCAP" in the last 168 hours. D-Dimer No results for input(s): "DDIMER" in the last 72 hours. Hgb A1c No results for input(s): "HGBA1C" in the last 72 hours. Lipid  Profile No results for input(s): "CHOL", "HDL", "LDLCALC", "TRIG", "CHOLHDL", "LDLDIRECT" in the last 72 hours. Thyroid function studies No results for input(s): "TSH", "T4TOTAL", "T3FREE", "THYROIDAB" in the last 72 hours.  Invalid input(s): "FREET3" Anemia work up No results for input(s): "VITAMINB12", "FOLATE", "FERRITIN", "TIBC", "IRON", "RETICCTPCT" in the last 72 hours. Urinalysis    Component Value Date/Time   COLORURINE YELLOW 11/29/2022 0850   APPEARANCEUR CLOUDY (A) 11/29/2022 0850   LABSPEC 1.010 11/29/2022  [place] 6.5 11/29/2022 0850   GLUCOSEU NEGATIVE 11/29/2022 0850   HGBUR TRACE (A) 11/29/2022 0850   BILIRUBINUR NEGATIVE 11/29/2022 0850   BILIRUBINUR n 09/05/2016 [place] 11/29/2022 0850   PROTEINUR NEGATIVE 11/29/2022 0850   UROBILINOGEN 1.0 09/05/2016 1520   NITRITE NEGATIVE 11/29/2022 0850   LEUKOCYTESUR SMALL (A) 11/29/2022 0850   Sepsis Labs Recent Labs  Lab 11/29/22 0613  WBC 5.4   Microbiology Recent Results (from the past 240 hour(s))  Blood Culture (routine x 2)     Status: None (Preliminary result)   Collection Time: 11/29/22  6:07 AM   Specimen: BLOOD  Result Value Ref Range Status   Specimen Description BLOOD BLOOD LEFT HAND  Final   Special Requests   Final    BOTTLES DRAWN AEROBIC AND ANAEROBIC Blood Culture adequate volume   Culture   Final    NO GROWTH 2 DAYS Performed at [facility], [place] [address], [place], [place] [address]    Report Status PENDING  Incomplete  Resp panel by RT-PCR (RSV, Flu A&B, Covid) Anterior Nasal Swab     Status: None   Collection Time: 11/29/22  6:10 AM   Specimen: Anterior Nasal Swab  Result Value Ref Range Status   SARS Coronavirus 2 by RT PCR NEGATIVE NEGATIVE Final   Influenza A by PCR NEGATIVE NEGATIVE Final   Influenza B by PCR NEGATIVE NEGATIVE Final    Comment: (NOTE) The Xpert Xpress SARS-CoV-2/FLU/RSV plus assay is intended as an aid in the diagnosis of influenza  from Nasopharyngeal swab specimens and should not be used as a sole basis for treatment. Nasal washings and aspirates are unacceptable for Xpert Xpress SARS-CoV-2/FLU/RSV testing.  Fact Sheet for Patients: EntrepreneurPulse.com.au  Fact Sheet for Healthcare Providers: IncredibleEmployment.be  This test is not yet approved or cleared by the [place] FDA and has been authorized for detection and/or diagnosis of SARS-CoV-2 by FDA under an Emergency Use Authorization (EUA). This EUA will remain in effect (meaning this test can be used) for the duration of the COVID-19 declaration under Section 564(b)(1) of the Act, 21 U.S.C. section 360bbb-3(b)(1), unless the authorization is terminated or revoked.     Resp Syncytial Virus by PCR NEGATIVE NEGATIVE Final    Comment: (NOTE) Fact Sheet for Patients: EntrepreneurPulse.com.au  Fact Sheet for Healthcare Providers: IncredibleEmployment.be  This test is not yet approved or cleared by the [place] FDA and has been authorized for detection and/or diagnosis of SARS-CoV-2 by FDA under an Emergency Use Authorization (EUA). This EUA will remain in effect (meaning this test can be used) for the duration of the COVID-19 declaration under Section 564(b)(1) of the Act, 21 U.S.C. section 360bbb-3(b)(1), unless the authorization is terminated or revoked.  Performed at [facility], [place] [address], [place], [place] [address]   Blood Culture (routine x 2)     Status: None (Preliminary result)   Collection Time: 11/29/22  6:12 AM   Specimen: BLOOD  Result Value Ref Range Status   Specimen Description BLOOD BLOOD RIGHT FOREARM  Final   Special Requests   Final    BOTTLES DRAWN AEROBIC AND ANAEROBIC Blood Culture adequate volume   Culture   Final    NO GROWTH 2 DAYS Performed at [facility], [place] [address], [place], [place] [address]    Report Status  PENDING  Incomplete  Urine Culture (for pregnant, neutropenic or urologic patients or patients with an indwelling urinary  catheter)     Status: Abnormal   Collection Time: 11/29/22  9:35 AM   Specimen: Urine, Clean Catch  Result Value Ref Range Status   Specimen Description URINE, CLEAN CATCH  Final   Special Requests   Final    NONE Performed at [facility], [address] [place], [place] [address]    Culture MULTIPLE SPECIES PRESENT, SUGGEST RECOLLECTION (A)  Final   Report Status 11/30/2022 FINAL  Final  Urine Culture (for pregnant, neutropenic or urologic patients or patients with an indwelling urinary catheter)     Status: None (Preliminary result)   Collection Time: 11/30/22 11:01 AM   Specimen: Urine, Clean Catch  Result Value Ref Range Status   Specimen Description URINE, CLEAN CATCH  Final   Special Requests NONE  Final   Culture   Final    NO GROWTH < 24 HOURS Performed at [facility], [place] [address] [place],  [address]    Report Status PENDING  Incomplete    Please note: You were cared for by a hospitalist during your hospital stay. Once you are discharged, your primary care physician will handle any further medical issues. Please note that NO REFILLS for any discharge medications will be authorized once you are discharged, as it is imperative that you return to your primary care physician (or establish a relationship with a primary care physician if you do not have one) for your post hospital discharge needs so that they can reassess your need for medications and monitor your lab values.    Time coordinating discharge: 40 minutes  SIGNED:   Shelly Coss, MD  [facility] 12/01/2022, 10:39 AM Pager [phone]  If 7PM-7AM, please contact night-coverage [email or web address] Password [email or web address]

## 2022-12-01 NOTE — TOC Transition Note (Signed)
Transition of Care [facility]) - CM/SW Discharge Note   Patient Details  Name: [name] MRN: [id number] Date of Birth: 08/24/1951  Transition of Care [facility]) CM/SW Contact:  Jinger Neighbors, LCSW Phone Number: 12/01/2022, 1:46 PM   Clinical Narrative:      PT discharging back to [facility] via [place].   Call to report: [phone]  PT's sister has been notified of pt's discharge back to ALF       Patient Goals and CMS Choice      Discharge Placement                         Discharge Plan and Services Additional resources added to the After Visit Summary for                                       Social Determinants of Health (SDOH) Interventions SDOH Screenings   Food Insecurity: No Food Insecurity (11/29/2022)  Housing: Low Risk  (11/29/2022)  Transportation Needs: No Transportation Needs (11/29/2022)  Utilities: Not At Risk (11/29/2022)  Alcohol Screen: Low Risk  (07/20/2022)  Depression (PHQ2-9): Low Risk  (07/20/2022)  Financial Resource Strain: Low Risk  (07/20/2022)  Physical Activity: Sufficiently Active (07/20/2022)  Social Connections: Moderately Integrated (07/20/2022)  Stress: Stress Concern Present (07/20/2022)  Tobacco Use: Low Risk  (11/29/2022)     Readmission Risk Interventions     No data to display

## 2022-12-01 NOTE — NC FL2 (Signed)
[place] LEVEL OF CARE FORM     IDENTIFICATION  Patient Name: [name] Birthdate: 1951-06-22 Sex: female Admission Date (Current Location): 11/29/2022  [facility] and [place] Number:  Herbalist and Address:  The . [facility], [place] [address] [place], [place] [address]      Provider Number: [id number]  Attending Physician Name and Address:  Shelly Coss, MD  Relative Name and Phone Number:  Francina Ames [phone]    Current Level of Care: Hospital Recommended Level of Care: [place] Prior Approval Number:    Date Approved/Denied:   PASRR Number:    Discharge Plan: Domiciliary (Rest home)    Current Diagnoses: Patient Active Problem List   Diagnosis Date Noted   Rheumatoid arthritis ([place]) 11/29/2022   Infection due to ESBL-producing Escherichia coli 11/09/2022   Acute pyelonephritis 11/09/2022   Bacteremia 11/09/2022   Sepsis ([place]) 11/09/2022   Dyspnea 11/08/2022   DJD (degenerative joint disease) of knee 04/13/2022   S/P TKR (total knee replacement), left 04/13/2022   Arthritis of right knee    S/P total knee arthroplasty, right 12/08/2021   Pressure injury of skin 08/27/2021   Fall 08/23/2021   Difficulty in walking 08/23/2021   Bilateral lower extremity edema 08/23/2021   Hormone replacement therapy 08/23/2021   Osteoporosis 01/28/2021   Osteoarthritis of right wrist 01/28/2021   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   Foot pain 09/13/2020   Knee pain 08/24/2018   Fall in home 07/14/2015   TIBIALIS TENDINITIS 08/25/2009   CHEST PAIN, OTHER, PAIN 10/21/2008   PEDAL EDEMA 07/22/2008   ANKLE PAIN, BILATERAL 04/01/2008   LOW BACK PAIN 04/01/2008   ACUTE BRONCHITIS 09/23/2007   SARCOIDOSIS 09/02/2007   Asthma 09/02/2007   Diverticulosis of colon 07/11/2007   Backache 07/11/2007    Orientation RESPIRATION BLADDER Height & Weight     Self  Normal Continent Weight: 185 lb (83.9  kg) Height:  5' (152.4 cm)  BEHAVIORAL SYMPTOMS/MOOD NEUROLOGICAL BOWEL NUTRITION STATUS      Continent Diet  AMBULATORY STATUS COMMUNICATION OF NEEDS Skin   Limited Assist Verbally Normal                       Personal Care Assistance Level of Assistance  Bathing, Feeding, Dressing Bathing Assistance: Limited assistance Feeding assistance: Independent Dressing Assistance: Limited assistance     Functional Limitations Info  Speech, Sight, Hearing Sight Info: Adequate Hearing Info: Adequate Speech Info: Adequate    SPECIAL CARE FACTORS FREQUENCY                       Contractures Contractures Info: Not present    Additional Factors Info  Code Status Code Status Info: Full             Current Medications (12/01/2022):  This is the current hospital active medication list Current Facility-Administered Medications  Medication Dose Route Frequency Provider Last Rate Last Admin   acetaminophen (TYLENOL) tablet 1,000 mg  1,000 mg Oral BH-qamhs Norins, Heinz Knuckles, MD   1,000 mg at 12/01/22 0825   albuterol (PROVENTIL) (2.5 MG/3ML) 0.083% nebulizer solution 2.5 mg  2.5 mg Inhalation Q6H PRN Norins, Heinz Knuckles, MD       amLODipine (NORVASC) tablet 5 mg  5 mg Oral Daily Adhikari, Amrit, MD   5 mg at 11/30/22 1337   cycloSPORINE (RESTASIS) 0.05 % ophthalmic emulsion 1 drop  1 drop Both Eyes BID  Norins, Heinz Knuckles, MD   1 drop at 11/30/22 2144   DULoxetine (CYMBALTA) DR capsule 30 mg  30 mg Oral Daily Norins, Heinz Knuckles, MD   30 mg at 11/30/22 1059   enoxaparin (LOVENOX) injection 40 mg  40 mg Subcutaneous Q24H Norins, Heinz Knuckles, MD   40 mg at 11/30/22 1058   fluticasone (FLONASE) 50 MCG/ACT nasal spray 1 spray  1 spray Each Nare Daily Norins, Heinz Knuckles, MD   1 spray at [id number]   folic acid (FOLVITE) tablet 1 mg  1 mg Oral Daily Norins, Heinz Knuckles, MD   1 mg at 11/30/22 1059   gabapentin (NEURONTIN) capsule 300 mg  300 mg Oral BID Neena Rhymes, MD   300 mg at 11/30/22  2143   HYDROcodone-acetaminophen (NORCO/VICODIN) 5-325 MG per tablet 1 tablet  1 tablet Oral Q8H PRN Norins, Heinz Knuckles, MD       loratadine (CLARITIN) tablet 10 mg  10 mg Oral QHS Norins, Heinz Knuckles, MD   10 mg at 11/30/22 2143   meropenem (MERREM) 1 g in sodium chloride 0.9 % 100 mL IVPB  1 g Intravenous Q8H Elsie Amis, RPH 200 mL/hr at 12/01/22 0244 1 g at 12/01/22 0244   methotrexate (RHEUMATREX) tablet 20 mg  20 mg Oral Q Wed Norins, Heinz Knuckles, MD   20 mg at 11/29/22 1755   mometasone-formoterol (DULERA) 200-5 MCG/ACT inhaler 2 puff  2 puff Inhalation BID Neena Rhymes, MD   2 puff at 12/01/22 0751   pantoprazole (PROTONIX) EC tablet 40 mg  40 mg Oral Daily Norins, Heinz Knuckles, MD   40 mg at 11/30/22 1058   predniSONE (DELTASONE) tablet 5 mg  5 mg Oral Daily Norins, Heinz Knuckles, MD   5 mg at 11/30/22 1059   psyllium (HYDROCIL/METAMUCIL) 1 packet  1 packet Oral Daily Norins, Heinz Knuckles, MD       senna (SENOKOT) tablet 8.6 mg  1 tablet Oral BID Neena Rhymes, MD   8.6 mg at 11/30/22 2143   [START ON 12/06/2022] Vitamin D (Ergocalciferol) (DRISDOL) 1.25 MG (50000 UNIT) capsule 50,000 Units  50,000 Units Oral Q Wed Norins, Heinz Knuckles, MD       zolpidem (AMBIEN) tablet 5 mg  5 mg Oral QHS PRN Norins, Heinz Knuckles, MD         Discharge Medications: Please see discharge summary for a list of discharge medications.  Relevant Imaging Results:  Relevant Lab Results:   Additional Information    Jinger Neighbors, LCSW

## 2022-12-02 LAB — URINE CULTURE: Culture: NO GROWTH

## 2022-12-04 ENCOUNTER — Telehealth: Payer: Self-pay

## 2022-12-04 LAB — CULTURE, BLOOD (ROUTINE X 2)
Culture: NO GROWTH
Culture: NO GROWTH
Special Requests: ADEQUATE
Special Requests: ADEQUATE

## 2022-12-04 NOTE — Transitions of Care (Post Inpatient/ED Visit) (Signed)
   12/04/2022  Name: Lamyah Durling Tesfaye MRN: [id number] DOB: 01/29/1951  Today's TOC FU Call Status: Today's TOC FU Call Status:: Successful TOC FU Call Competed TOC FU Call Complete Date: 12/04/22  Transition Care Management Follow-up Telephone Call Date of Discharge: 12/01/22 Discharge Facility: [facility]) Type of Discharge: Inpatient Admission Primary Inpatient Discharge Diagnosis:: infection due to ESBL-producing Escherichia coli How have you been since you were released from the hospital?: Better Any questions or concerns?: No  Items Reviewed: Did you receive and understand the discharge instructions provided?: Yes Medications obtained and verified?: Yes (Medications Reviewed) Any new allergies since your discharge?: No Dietary orders reviewed?: NA Do you have support at home?: Yes People in Home: significant other  Home Care and Equipment/Supplies: [place] Ordered?: No Any new equipment or medical supplies ordered?: No  Functional Questionnaire: Do you need assistance with bathing/showering or dressing?: No Do you need assistance with meal preparation?: Yes Do you need assistance with eating?: No Do you have difficulty maintaining continence: No Do you need assistance with getting out of bed/getting out of a chair/moving?: No Do you have difficulty managing or taking your medications?: No  Folllow up appointments reviewed: PCP Follow-up appointment confirmed?: Yes Date of PCP follow-up appointment?: 12/07/22 Follow-up Provider: [facility] Follow-up appointment confirmed?: No Do you need transportation to your follow-up appointment?: No Do you understand care options if your condition(s) worsen?: Yes-patient verbalized understanding    [place] LPN [place] Direct Dial [phone]

## 2022-12-07 ENCOUNTER — Encounter: Payer: Self-pay | Admitting: Family Medicine

## 2022-12-07 ENCOUNTER — Ambulatory Visit: Payer: Medicare PPO | Admitting: Family Medicine

## 2022-12-07 ENCOUNTER — Inpatient Hospital Stay: Payer: Medicare PPO | Admitting: Family Medicine

## 2022-12-07 VITALS — BP 150/86 | HR 91 | Temp 98.4°F | Ht 60.0 in | Wt 185.2 lb

## 2022-12-07 DIAGNOSIS — R519 Headache, unspecified: Secondary | ICD-10-CM

## 2022-12-07 DIAGNOSIS — Z09 Encounter for follow-up examination after completed treatment for conditions other than malignant neoplasm: Secondary | ICD-10-CM

## 2022-12-07 DIAGNOSIS — J4530 Mild persistent asthma, uncomplicated: Secondary | ICD-10-CM | POA: Diagnosis not present

## 2022-12-07 DIAGNOSIS — D649 Anemia, unspecified: Secondary | ICD-10-CM | POA: Diagnosis not present

## 2022-12-07 DIAGNOSIS — I10 Essential (primary) hypertension: Secondary | ICD-10-CM | POA: Diagnosis not present

## 2022-12-07 LAB — COMPREHENSIVE METABOLIC PANEL
ALT: 12 U/L (ref 0–35)
AST: 17 U/L (ref 0–37)
Albumin: 3.7 g/dL (ref 3.5–5.2)
Alkaline Phosphatase: 92 U/L (ref 39–117)
BUN: 9 mg/dL (ref 6–23)
CO2: 28 mEq/L (ref 19–32)
Calcium: 8.7 mg/dL (ref 8.4–10.5)
Chloride: 103 mEq/L (ref 96–112)
Creatinine, Ser: 0.43 mg/dL (ref 0.40–1.20)
GFR: 96.37 mL/min (ref >60.00)
Glucose, Bld: 106 mg/dL — ABNORMAL HIGH (ref 70–99)
Potassium: 4 mEq/L (ref 3.5–5.1)
Sodium: 139 mEq/L (ref 135–145)
Total Bilirubin: 0.4 mg/dL (ref 0.2–1.2)
Total Protein: 7.1 g/dL (ref 6.0–8.3)

## 2022-12-07 LAB — CBC WITH DIFFERENTIAL/PLATELET
Basophils Absolute: 0.1 10*3/uL (ref 0.0–0.1)
Basophils Relative: 1.1 % (ref 0.0–3.0)
Eosinophils Absolute: 0.4 10*3/uL (ref 0.0–0.7)
Eosinophils Relative: 5.7 % — ABNORMAL HIGH (ref 0.0–5.0)
HCT: 36.8 % (ref 36.0–46.0)
Hemoglobin: 11.7 g/dL — ABNORMAL LOW (ref 12.0–15.0)
Lymphocytes Relative: 20.9 % (ref 12.0–46.0)
Lymphs Abs: 1.6 10*3/uL (ref 0.7–4.0)
MCHC: 31.8 g/dL (ref 30.0–36.0)
MCV: 93.4 fl (ref 78.0–100.0)
Monocytes Absolute: 0.6 10*3/uL (ref 0.1–1.0)
Monocytes Relative: 8.6 % (ref 3.0–12.0)
Neutro Abs: 4.8 10*3/uL (ref 1.4–7.7)
Neutrophils Relative %: 63.7 % (ref 43.0–77.0)
Platelets: 419 10*3/uL — ABNORMAL HIGH (ref 150.0–400.0)
RBC: 3.94 Mil/uL (ref 3.87–5.11)
RDW: 16.6 % — ABNORMAL HIGH (ref 11.5–15.5)
WBC: 7.6 10*3/uL (ref 4.0–10.5)

## 2022-12-07 NOTE — Progress Notes (Signed)
Established Patient Office Visit   Subjective  Patient ID: [name], female    DOB: [birth year]  Age: 72 y.o. MRN: [id number]  Chief Complaint  Patient presents with   Hospitalization Follow-up    Patient is a 72 year old female with  has a past medical history of Allergy, Anxiety, Arthritis,, Cataract, Depression, Diverticulitis, Diverticulosis of colon, GERD, Glaucoma, Lumbago, Obesity, Sarcoidosis, asthma who was seen for HFU.  Patient admitted 2/28-3/1 for sepsis 2/2 E. coli UTI.  Right-sided Pilo noted.  Meropenem x 7 days started.  Patient states she is getting better, slightly out of the hospital.  Taking Norvasc 5 mg daily for HTN.  On methotrexate, prednisone, and Humira for RA.  Still having discomfort in hands.  Endorses headache in back of head x~1 wk since, discharge from hospital.  Trying to drink more water but states not readily available at [facility].     Patient Active Problem List   Diagnosis Date Noted   Rheumatoid arthritis 11/29/2022   Infection due to ESBL-producing Escherichia coli 11/09/2022   Acute pyelonephritis 11/09/2022   Bacteremia 11/09/2022   Sepsis 11/09/2022   Dyspnea 11/08/2022   DJD (degenerative joint disease) of knee 04/13/2022   S/P TKR (total knee replacement), left 04/13/2022   Arthritis of right knee    S/P total knee arthroplasty, right 12/08/2021   Pressure injury of skin 08/27/2021   Fall 08/23/2021   Difficulty in walking 08/23/2021   Bilateral lower extremity edema 08/23/2021   Hormone replacement therapy 08/23/2021   Osteoporosis 01/28/2021   Osteoarthritis of right wrist 01/28/2021   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   Foot pain 09/13/2020   Knee pain 08/24/2018   Fall in home 07/14/2015   TIBIALIS TENDINITIS 08/25/2009   CHEST PAIN, OTHER, PAIN 10/21/2008   PEDAL EDEMA 07/22/2008   ANKLE PAIN, BILATERAL 04/01/2008   LOW BACK PAIN 04/01/2008   ACUTE BRONCHITIS 09/23/2007   SARCOIDOSIS 09/02/2007    Asthma 09/02/2007   Diverticulosis of colon 07/11/2007   Backache 07/11/2007   Past Surgical History:  Procedure Laterality Date   ABDOMINAL HYSTERECTOMY     CATARACT EXTRACTION W/PHACO Right 06/24/2014   Procedure: CATARACT EXTRACTION PHACO AND INTRAOCULAR LENS PLACEMENT (IOC) RIGHT EYE WITH GONIOSYNECHIALYSIS;  Surgeon: Chalmers Guest, MD;  Location: [facility] OR;  Service: Ophthalmology;  Laterality: Right;   COLONOSCOPY     EYE SURGERY Left    cataract surgery   HAND SURGERY Right    trigger finger release   IR RADIOLOGIST EVAL & MGMT  11/14/2018   KNEE ARTHROSCOPY Right    TOTAL KNEE ARTHROPLASTY Right 12/08/2021   Procedure: RIGHT TOTAL KNEE ARTHROPLASTY APPLICATION OF WOUND VAC;  Surgeon: Cammy Copa, MD;  Location: [facility] OR;  Service: Orthopedics;  Laterality: Right;   TOTAL KNEE ARTHROPLASTY Left 04/13/2022   Procedure: LEFT TOTAL KNEE ARTHROPLASTY;  Surgeon: Cammy Copa, MD;  Location: [facility] OR;  Service: Orthopedics;  Laterality: Left;   Social History   Tobacco Use   Smoking status: Never   Smokeless tobacco: Never  Vaping Use   Vaping Use: Never used  Substance Use Topics   Alcohol use: Yes    Comment: 1 glass of wine or mixed drink once a week   Drug use: No   Family History  Problem Relation Age of Onset   Hypertension Mother    Stroke Mother    Diabetes Father    Sarcoidosis Sister    Colon cancer Neg Hx  Colon polyps Neg Hx    Esophageal cancer Neg Hx    Rectal cancer Neg Hx    Stomach cancer Neg Hx    Allergies  Allergen Reactions   Invanz [Ertapenem] Swelling    "Lips swelled up"   Ibuprofen Rash   Sulfa Antibiotics Rash      ROS Negative unless stated above    Objective:     BP (!) 150/86 (BP Location: Left Arm, Patient Position: Sitting, Cuff Size: Large)   Pulse 91   Temp 98.4 F (36.9 C) (Oral)   Ht 5' (1.524 m)   Wt 185 lb 3.2 oz (84 kg)   SpO2 98%   BMI 36.17 kg/m  BP Readings from Last 3 Encounters:  12/07/22 (!)  150/86  12/01/22 (!) 159/96  11/18/22 (!) 146/85   Wt Readings from Last 3 Encounters:  12/07/22 185 lb 3.2 oz (84 kg)  11/29/22 185 lb (83.9 kg)  11/08/22 192 lb 3.9 oz (87.2 kg)      Physical Exam Constitutional:      General: She is not in acute distress.    Appearance: Normal appearance.  HENT:     Head: Normocephalic and atraumatic.     Nose: Nose normal.     Mouth/Throat:     Mouth: Mucous membranes are moist.  Eyes:     Extraocular Movements: Extraocular movements intact.     Conjunctiva/sclera: Conjunctivae normal.     Pupils: Pupils are equal, round, and reactive to light.  Cardiovascular:     Rate and Rhythm: Normal rate and regular rhythm.     Heart sounds: Normal heart sounds. No murmur heard.    No gallop.  Pulmonary:     Effort: Pulmonary effort is normal. No respiratory distress.     Breath sounds: Normal breath sounds. No wheezing, rhonchi or rales.  Musculoskeletal:     Comments: Mild deformity of fingers of R hand.    Skin:    General: Skin is warm and dry.  Neurological:     Mental Status: She is alert and oriented to person, place, and time.     No results found for any visits on 12/07/22.    Assessment & Plan:  Hospital discharge follow-up -TCM phone call from 12/04/22 reviewed. -Notes, labs, imaging from hospital reviewed.  Essential hypertension -Elevated -Discussed the importance of medication compliance and lifestyle modifications -Continue current medications Norvasc 5 mg daily -     Comprehensive metabolic panel  Mild persistent asthma without complication -Stable -Continue Wixela and albuterol.  Nonintractable headache, unspecified chronicity pattern, unspecified headache type -Discussed possible causes including dehydration, elevated BP -Supportive care -For continued or worsening symptoms follow-up with neurology  Hypocalcemia -Calcium 8.2 on 11/30/2022 -Recheck labs -Replete as needed -     Comprehensive metabolic panel -      Parathyroid hormone, intact (no Ca)  Anemia, unspecified type -H/H 10.7/35.3 on 11/29/2022 -     CBC with Differential/Platelet    Return in about 6 weeks (around 01/18/2023), or if symptoms worsen or fail to improve.   Deeann SaintShannon R Estanislao Harmon, MD

## 2022-12-10 LAB — PARATHYROID HORMONE, INTACT (NO CA): PTH: 159 pg/mL — ABNORMAL HIGH (ref 16–77)

## 2022-12-10 LAB — EXTRA SPECIMEN

## 2022-12-21 ENCOUNTER — Telehealth: Payer: Self-pay | Admitting: Orthopedic Surgery

## 2022-12-22 ENCOUNTER — Other Ambulatory Visit (INDEPENDENT_AMBULATORY_CARE_PROVIDER_SITE_OTHER): Payer: Medicare PPO

## 2022-12-22 ENCOUNTER — Ambulatory Visit: Payer: Medicare PPO | Admitting: Surgical

## 2022-12-22 ENCOUNTER — Other Ambulatory Visit: Payer: Self-pay

## 2022-12-22 DIAGNOSIS — Z96651 Presence of right artificial knee joint: Secondary | ICD-10-CM

## 2022-12-22 DIAGNOSIS — M19022 Primary osteoarthritis, left elbow: Secondary | ICD-10-CM

## 2022-12-23 ENCOUNTER — Encounter: Payer: Self-pay | Admitting: Surgical

## 2022-12-23 MED ORDER — LIDOCAINE HCL 1 % IJ SOLN
3.0000 mL | INTRAMUSCULAR | Status: AC | PRN
  Administered 2022-12-22: 3 mL

## 2022-12-23 MED ORDER — METHYLPREDNISOLONE ACETATE 40 MG/ML IJ SUSP
20.0000 mg | INTRAMUSCULAR | Status: AC | PRN
  Administered 2022-12-22: 20 mg

## 2022-12-23 MED ORDER — BUPIVACAINE HCL 0.5 % IJ SOLN
2.5000 mL | INTRAMUSCULAR | Status: AC | PRN
  Administered 2022-12-22: 2.5 mL

## 2022-12-23 NOTE — Progress Notes (Signed)
Office Visit Note   Patient: [name]           Date of Birth: 1951-08-08           MRN: [id number] Visit Date: 12/22/2022 Requested by: [name], MD [place],  [place] [address] PCP: [name], MD  Subjective: Chief Complaint  Patient presents with   Right Knee - Pain   Neck - Follow-up    HPI: [name] is a 72 y.o. female who presents to the office reporting right knee discomfort.  Patient states that she had a fall in February on her right knee and she has had some increased numbness and tingling sensation in the lateral aspect of the anterior knee since that event.  She has been weightbearing with a walker and states that she does not really have any new trouble weightbearing.  No groin pain.  This describes a soreness in the knee.  No restriction in her range of motion.  No fevers or chills.  Has not noticed any increase swelling in the knees.  She does have history of recent hospitalizations for UTIs.  She is no longer on antibiotics for this.  She also complains of left elbow pain that she localizes diffusely throughout the elbow without any radiation.  She does not feel there is any numbness or tingling associated with this elbow.  She states that this elbow pain began before her hospitalizations and has just been ongoing for the last several months without any worsening after she was diagnosed with her UTIs in the hospital.  She had attempted aspiration in the elbow by interventional radiology team but they were not able to aspirate any significant amount of fluid from the elbow.  Patient also complains of numbness in her right hand.  She has history of severe right-sided and mild left-sided carpal tunnel syndrome that was diagnosed on nerve conduction study by Dr. Ernestina Patches in December 2023.  She states that it is awkward to use a pen and sometimes she drops this so this is frustrating for her.  Most of the numbness she actually localizes  to this hand is on the dorsal side of the hand however..                ROS: All systems reviewed are negative as they relate to the chief complaint within the history of present illness.  Patient denies fevers or chills.  Assessment & Plan: Visit Diagnoses:  1. Arthritis of left elbow   2. History of total right knee replacement     Plan: Patient is a 72 year old female who presents for evaluation of right knee discomfort.  Radiographs of the right knee taken today demonstrate no significant abnormality.  She has a little bit of increased numbness and tingling in the anterolateral aspect of the knee but from a functional standpoint, seems her knees are doing well.  She is able to ambulate with a walker at her rehab center.  She is looking forward to moving to a townhome once she finds 1 suitable for herself.  No sign of prosthetic joint infection.  Her main complaint today seems to be actually that her left elbow has been causing her significant pain for the last several months.  She had radiographs of the left elbow that was obtained on 11/08/2022 demonstrating advanced arthritis of the elbow.  This was bothering her prior to her difficulty with hospitalization and UTIs.  She had attempted aspiration by IR  without any fluid that was able to be taken from the joint.  After discussion of options, she would like to try left elbow injection today.  Under ultrasound guidance, needle was placed into the posterior recess of the elbow joint.  Aspiration was attempted but again no fluid was able to be withdrawn.  Cortisone injection delivered successfully with patient tolerated procedure well.  No resistance was felt during injection administration.  5 to 10 minutes later, she had substantial relief of her left elbow pain.  Will see how this does for her.  Additionally, recommended she keep an eye on her right hand symptoms and see if she notices any consistent numbness and tingling in the median nerve  distribution which was discussed with her today.  Could consider carpal tunnel release in the future if she would like to go that route.  We also discussed the possibility of carpal tunnel injections but she understands that this is a temporizing measure and she has fairly severe carpal tunnel syndrome noted on nerve conduction study.  Follow-up as needed.  Follow-Up Instructions: No follow-ups on file.   Orders:  Orders Placed This Encounter  Procedures   XR KNEE 3 VIEW RIGHT   US Guided Needle Placement - No Linked Charges   No orders of the defined types were placed in this encounter.     Procedures: Left elbow joint injection on 12/22/2022 11:40 AM Indications: therapeutic and pain Details: 18 G needle, ultrasound-guided lateral approach Medications: 3 mL lidocaine 1 %; 2.5 mL bupivacaine 0.5 %; 20 mg methylPREDNISolone acetate 40 MG/ML Outcome: tolerated well, no immediate complications Procedure, treatment alternatives, risks and benefits explained, specific risks discussed. Immediately prior to procedure a time out was called to verify the correct patient, procedure, equipment, support staff and site/side marked as required. Patient was prepped and draped in the usual sterile fashion.       Clinical Data: No additional findings.  Objective: Vital Signs: There were no vitals taken for this visit.  Physical Exam:  Constitutional: Patient appears well-developed HEENT:  Head: Normocephalic Eyes:EOM are normal Neck: Normal range of motion Cardiovascular: Normal rate Pulmonary/chest: Effort normal Neurologic: Patient is alert Skin: Skin is warm Psychiatric: Patient has normal mood and affect  Ortho Exam: Ortho exam demonstrates right knee with no effusion.  Left knee with no effusion.  Incisions are well-healed without cellulitis or sinus tract noted.  She has 0 degrees extension of the right knee with greater than 90 degrees of knee flexion without pain.  She has no calf  tenderness bilaterally.  Negative Homans' sign bilaterally.  No pain with hip range of motion bilaterally.  Excellent quad strength rated 5/5 bilaterally.  She has left elbow with tenderness diffusely throughout the elbow joint with pain with passive motion of the elbow though she has good range of motion from 0 degrees to greater than 120 degrees.  There is crepitus that is noted with passive motion of the elbow that seems consistent with arthritis.  Intact bicep and tricep strength without weakness.  No cellulitis or skin changes noted overlying the left elbow.  Specialty Comments:  EMG/NCS 03/31/21 Impression: The above electrodiagnostic study is ABNORMAL and reveals evidence of:   1. a severe right median nerve entrapment at the wrist (carpal tunnel syndrome) affecting sensory and motor components.    2. a mild left median nerve entrapment at the wrist (carpal tunnel syndrome) affecting sensory components.   There is no significant electrodiagnostic evidence of any other focal nerve  entrapment, brachial plexopathy or cervical radiculopathy.    Recommendations: 1.  Follow-up with referring physician. 2.  Continue current management of symptoms. 3.  Suggest surgical evaluation.   ___________________________ [name] Board Certified, American Board of Physical Medicine and Rehabilitation  Imaging: No results found.   PMFS History: Patient Active Problem List   Diagnosis Date Noted   Rheumatoid arthritis ([place]) 11/29/2022   Infection due to ESBL-producing Escherichia coli 11/09/2022   Acute pyelonephritis 11/09/2022   Bacteremia 11/09/2022   Sepsis ([place]) 11/09/2022   Dyspnea 11/08/2022   DJD (degenerative joint disease) of knee 04/13/2022   S/P TKR (total knee replacement), left 04/13/2022   Arthritis of right knee    S/P total knee arthroplasty, right 12/08/2021   Pressure injury of skin 08/27/2021   Fall 08/23/2021   Difficulty in walking 08/23/2021   Bilateral  lower extremity edema 08/23/2021   Hormone replacement therapy 08/23/2021   Osteoporosis 01/28/2021   Osteoarthritis of right wrist 01/28/2021   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   Foot pain 09/13/2020   Knee pain 08/24/2018   Fall in home 07/14/2015   TIBIALIS TENDINITIS 08/25/2009   CHEST PAIN, OTHER, PAIN 10/21/2008   PEDAL EDEMA 07/22/2008   ANKLE PAIN, BILATERAL 04/01/2008   LOW BACK PAIN 04/01/2008   ACUTE BRONCHITIS 09/23/2007   SARCOIDOSIS 09/02/2007   Asthma 09/02/2007   Diverticulosis of colon 07/11/2007   Backache 07/11/2007   Past Medical History:  Diagnosis Date   Acute bronchitis    Allergy    Anxiety    Arthritis    knees   Asthma    Backache, unspecified    Cataract    removed both eyes   Depression    Diverticulitis    Diverticulosis of colon (without mention of hemorrhage)    Edema    GERD (gastroesophageal reflux disease)    Glaucoma    Lumbago    Obesity    Other chest pain    Pain in joint, ankle and foot    Sarcoidosis    Tibialis tendinitis    Unspecified asthma(493.90)     Family History  Problem Relation Age of Onset   Hypertension Mother    Stroke Mother    Diabetes Father    Sarcoidosis Sister    Colon cancer Neg Hx    Colon polyps Neg Hx    Esophageal cancer Neg Hx    Rectal cancer Neg Hx    Stomach cancer Neg Hx     Past Surgical History:  Procedure Laterality Date   ABDOMINAL HYSTERECTOMY     CATARACT EXTRACTION W/PHACO Right 06/24/2014   Procedure: CATARACT EXTRACTION PHACO AND INTRAOCULAR LENS PLACEMENT ([place]) RIGHT EYE WITH GONIOSYNECHIALYSIS;  Surgeon: Marylynn Pearson, MD;  Location: [facility];  Service: Ophthalmology;  Laterality: Right;   COLONOSCOPY     EYE SURGERY Left    cataract surgery   HAND SURGERY Right    trigger finger release   IR RADIOLOGIST EVAL & MGMT  11/14/2018   KNEE ARTHROSCOPY Right    TOTAL KNEE ARTHROPLASTY Right 12/08/2021   Procedure: RIGHT TOTAL KNEE ARTHROPLASTY APPLICATION OF WOUND VAC;  Surgeon:  Meredith Pel, MD;  Location: [facility];  Service: Orthopedics;  Laterality: Right;   TOTAL KNEE ARTHROPLASTY Left 04/13/2022   Procedure: LEFT TOTAL KNEE ARTHROPLASTY;  Surgeon: Meredith Pel, MD;  Location: [facility];  Service: Orthopedics;  Laterality: Left;   Social History   Occupational History   Not on file  Tobacco Use  Smoking status: Never   Smokeless tobacco: Never  Vaping Use   Vaping Use: Never used  Substance and Sexual Activity   Alcohol use: Yes    Comment: 1 glass of wine or mixed drink once a week   Drug use: No   Sexual activity: Not on file

## 2023-01-09 ENCOUNTER — Telehealth: Payer: Self-pay

## 2023-01-09 NOTE — Telephone Encounter (Signed)
Verbal order provided to [name] with HHPT ok to continue HHPT for right knee

## 2023-01-17 ENCOUNTER — Telehealth: Payer: Self-pay | Admitting: Orthopedic Surgery

## 2023-01-17 NOTE — Telephone Encounter (Signed)
Verbal ok given.

## 2023-01-17 NOTE — Telephone Encounter (Signed)
Verbal orders needed for this patient. PT [name] [phone]  Requesting 1-2x per week for up to 8 weeks

## 2023-01-18 ENCOUNTER — Encounter: Payer: Self-pay | Admitting: Family Medicine

## 2023-01-18 ENCOUNTER — Telehealth: Payer: Self-pay | Admitting: Family Medicine

## 2023-01-18 ENCOUNTER — Ambulatory Visit: Payer: Medicare PPO | Admitting: Family Medicine

## 2023-01-18 VITALS — BP 134/74 | HR 90 | Temp 98.5°F | Ht 60.0 in | Wt 200.0 lb

## 2023-01-18 DIAGNOSIS — D869 Sarcoidosis, unspecified: Secondary | ICD-10-CM

## 2023-01-18 DIAGNOSIS — M25522 Pain in left elbow: Secondary | ICD-10-CM

## 2023-01-18 DIAGNOSIS — M069 Rheumatoid arthritis, unspecified: Secondary | ICD-10-CM

## 2023-01-18 DIAGNOSIS — Z6839 Body mass index (BMI) 39.0-39.9, adult: Secondary | ICD-10-CM

## 2023-01-18 DIAGNOSIS — M19022 Primary osteoarthritis, left elbow: Secondary | ICD-10-CM

## 2023-01-18 NOTE — Telephone Encounter (Signed)
Dorian PT [facility] is calling and need VO for PT 1-2x per wk for up to 6 wks

## 2023-01-18 NOTE — Progress Notes (Signed)
Established Patient Office Visit   Subjective  Patient ID: [name], female    DOB: 1951/03/04  Age: 72 y.o. MRN: [id number]  Chief Complaint  Patient presents with   Medical Management of Chronic Issues    Patient is a 72 year old female with pmh sig for RA, atherosclerosis of aorta, osteoporosis, primary arthritis, sarcoidosis, patient had recent appointments with Ortho and PMNR for joint pain.  Advised left elbow pain 2/2 OA.  Steroid injection has not been helpful.  Seen by rheumatology for rheumatoid arthritis.  Currently on Humira and methotrexate which were not helping.  Plans to start Carlis Abbott, but waiting on the insurance approval.  Using topical analgesics as needed.  Patient concerned about weight gain since being on prednisone and more sedentary.  No longer in rehab facility.  Moved into a condo.  Has HH PT. Has difficulty using walker 2/2 L elbow pain.  Scoots around using wheelchair.  Inquires about alternative medication options to help with pain.  Having transportation issues, paying $50 one way for uber to appointments.  Has friends that help when they can.  Patient also inquires about meal delivery options as she does not cook.    Past Medical History:  Diagnosis Date   Acute bronchitis    Allergy    Anxiety    Arthritis    knees   Asthma    Backache, unspecified    Cataract    removed both eyes   Depression    Diverticulitis    Diverticulosis of colon (without mention of hemorrhage)    Edema    GERD (gastroesophageal reflux disease)    Glaucoma    Lumbago    Obesity    Other chest pain    Pain in joint, ankle and foot    Sarcoidosis    Tibialis tendinitis    Unspecified asthma(493.90)    Past Surgical History:  Procedure Laterality Date   ABDOMINAL HYSTERECTOMY     CATARACT EXTRACTION W/PHACO Right 06/24/2014   Procedure: CATARACT EXTRACTION PHACO AND INTRAOCULAR LENS PLACEMENT ([facility]) RIGHT EYE WITH GONIOSYNECHIALYSIS;  Surgeon: Chalmers Guest, MD;   Location[facility];  Service: Ophthalmology;  Laterality: Right;   COLONOSCOPY     EYE SURGERY Left    cataract surgery   HAND SURGERY Right    trigger finger release   IR RADIOLOGIST EVAL & MGMT  11/14/2018   KNEE ARTHROSCOPY Right    TOTAL KNEE ARTHROPLASTY Right 12/08/2021   Procedure: RIGHT TOTAL KNEE ARTHROPLASTY APPLICATION OF WOUND VAC;  Surgeon: Cammy Copa, MD;  Location: [facility] OR;  Service: Orthopedics;  Laterality: Right;   TOTAL KNEE ARTHROPLASTY Left 04/13/2022   Procedure: LEFT TOTAL KNEE ARTHROPLASTY;  Surgeon: Cammy Copa, MD;  [facility];  Service: Orthopedics;  Laterality: Left;   Social History   Tobacco Use   Smoking status: Never   Smokeless tobacco: Never  Vaping Use   Vaping Use: Never used  Substance Use Topics   Alcohol use: Yes    Comment: 1 glass of wine or mixed drink once a week   Drug use: No   Family History  Problem Relation Age of Onset   Hypertension Mother    Stroke Mother    Diabetes Father    Sarcoidosis Sister    Colon cancer Neg Hx    Colon polyps Neg Hx    Esophageal cancer Neg Hx    Rectal cancer Neg Hx    Stomach cancer Neg Hx  Allergies  Allergen Reactions   Invanz [Ertapenem] Swelling    "Lips swelled up"   Ibuprofen Rash   Sulfa Antibiotics Rash      ROS Negative unless stated above    Objective:     BP 134/74 (BP Location: Left Arm, Patient Position: Sitting, Cuff Size: Large)   Pulse 90   Temp 98.5 F (36.9 C) (Oral)   Ht 5' (1.524 m)   Wt 200 lb (90.7 kg)   SpO2 98%   BMI 39.06 kg/m  BP Readings from Last 3 Encounters:  01/18/23 134/74  12/07/22 (!) 150/86  12/01/22 (!) 159/96   Wt Readings from Last 3 Encounters:  01/18/23 200 lb (90.7 kg)  12/07/22 185 lb 3.2 oz (84 kg)  11/29/22 185 lb (83.9 kg)      Physical Exam Constitutional:      General: She is not in acute distress.    Appearance: Normal appearance.  HENT:     Head: Normocephalic and atraumatic.     Nose: Nose  normal.     Mouth/Throat:     Mouth: Mucous membranes are moist.  Eyes:     Extraocular Movements: Extraocular movements intact.     Conjunctiva/sclera: Conjunctivae normal.     Pupils: Pupils are equal, round, and reactive to light.  Cardiovascular:     Rate and Rhythm: Normal rate and regular rhythm.     Heart sounds: Normal heart sounds. No murmur heard.    No gallop.     Comments: No LE edema. Pulmonary:     Effort: Pulmonary effort is normal. No respiratory distress.     Breath sounds: Normal breath sounds. No wheezing, rhonchi or rales.  Skin:    General: Skin is warm and dry.  Neurological:     Mental Status: She is alert and oriented to person, place, and time.     Comments: Sitting in transport wheelchair.     No results found for any visits on 01/18/23.    Assessment & Plan:  Rheumatoid arthritis involving multiple sites, unspecified whether rheumatoid factor present -continue supportive care -continue current meds MTX, Humira, folic acid. -plans to start Kevzara -discussed alternative therapy options -f/u with Rheumatology  Left elbow pain -2/2 arthritis -s/p steroid injection, not effective -continue supportive care with topical analgesics, Tylenol, heat, etc -f/u with Ortho.  Arthritis of left elbow -Continue supportive care -Discussed alternative therapy options.  Sarcoidosis -stable -not on treatment  Class 2 severe obesity with serious comorbidity and body mass index (BMI) of 39.0 to 39.9 in adult, unspecified obesity type -Body mass index is 39.06 kg/m. -cause multifactorial including meds (prednisone) and sedentary lifestyle -continue PT -     Amb Ref to Medical Weight Management   On day of service, 34 minutes spent caring for this patient face-to-face, reviewing the chart, counseling and/or coordinating care for plan and treatment of diagnosis below.    Return in about 3 months (around 04/19/2023), or if symptoms worsen or fail to improve.    Deeann Saint, MD

## 2023-01-18 NOTE — Telephone Encounter (Signed)
Per chart review, [name] was given  the ok yesterday 4/17 by Ortho.

## 2023-01-18 NOTE — Patient Instructions (Addendum)
[facility] [place] [facility]  [phone] [address] [place], [place] [address] [email or web address]   [facility] has the Salt room in [place].  Look into [employer] and your insurance company for transportation.   [employer] and [employer] are 2 premade meal delivery companies.  If you google there are a few more that come up.

## 2023-01-19 NOTE — Telephone Encounter (Signed)
Noted  

## 2023-02-09 ENCOUNTER — Telehealth: Payer: Self-pay | Admitting: Family Medicine

## 2023-02-09 MED ORDER — AMLODIPINE BESYLATE 5 MG PO TABS: 5.0000 mg | ORAL_TABLET | Freq: Every day | ORAL | 1 refills | Status: DC

## 2023-02-09 NOTE — Telephone Encounter (Signed)
Prescription Request  02/09/2023  LOV: 01/18/2023  What is the name of the medication or equipment? amLODipine (NORVASC) 5 MG tablet   Have you contacted your pharmacy to request a refill? No   Which pharmacy would you like this sent to?  [facility] - [place], [place] - [address] Phone: [phone]  Fax: [phone]     Patient notified that their request is being sent to the clinical staff for review and that they should receive a response within 2 business days.   Please advise at Mobile [phone] (mobile)

## 2023-02-09 NOTE — Telephone Encounter (Signed)
Refill sent to requested pharmacy.

## 2023-02-15 ENCOUNTER — Encounter: Payer: Self-pay | Admitting: Family Medicine

## 2023-02-15 ENCOUNTER — Ambulatory Visit (INDEPENDENT_AMBULATORY_CARE_PROVIDER_SITE_OTHER): Payer: Self-pay | Admitting: Family Medicine

## 2023-02-15 VITALS — BP 118/68 | HR 97 | Temp 98.5°F

## 2023-02-15 DIAGNOSIS — M25512 Pain in left shoulder: Secondary | ICD-10-CM

## 2023-02-15 DIAGNOSIS — R600 Localized edema: Secondary | ICD-10-CM

## 2023-02-15 DIAGNOSIS — M545 Low back pain, unspecified: Secondary | ICD-10-CM

## 2023-02-15 DIAGNOSIS — S46812A Strain of other muscles, fascia and tendons at shoulder and upper arm level, left arm, initial encounter: Secondary | ICD-10-CM

## 2023-02-15 DIAGNOSIS — D171 Benign lipomatous neoplasm of skin and subcutaneous tissue of trunk: Secondary | ICD-10-CM

## 2023-02-15 DIAGNOSIS — I10 Essential (primary) hypertension: Secondary | ICD-10-CM

## 2023-02-15 MED ORDER — BLOOD PRESSURE MONITOR MISC: 1.0000 | Freq: Every day | 0 refills | Status: AC

## 2023-02-15 MED ORDER — BLOOD PRESSURE MONITOR MISC: 1.0000 | Freq: Every day | 0 refills | Status: DC

## 2023-02-15 NOTE — Patient Instructions (Signed)
You can use over-the-counter topical medications such as Aspercreme, Biofreeze, IcyHot, etc.  You can also use heat and massage to help with your shoulder.

## 2023-02-15 NOTE — Progress Notes (Signed)
Established Patient Office Visit   Subjective  Patient ID: [name], female    DOB: 05-Nov-1950  Age: 72 y.o. MRN: [id number]  Chief Complaint  Patient presents with   Pain    Neck and shoulder pain in left side, going on about 3 wks.  Dull pain, in the morning and mid afternoon. Has taken tylenol, but does not take the pain away. Lidocaine does not knock out pain either. Pain was intensive this motning and could not go to work.     Pt is a 72 yo female was seen for acute concern.  Patient endorses elevation in BP at home, 143/110 this morning.  Patient states she was out of BP meds x 1 month due to issues receiving med from pharmacy.  Unsure if BP cuff is accurate as it is several years old.  Patient notes headaches in the occipital area and feeling unsteady and feet while off medication.  Also notes increased LE edema.  Trying to elevate feet while sleeping.  Sits in wheelchair for majority of the day.  Working on sodium intake.  Had a craving for chips the other day.  Patient notes left shoulder/neck pain x 1 month.  Pain is intermittent.  Noted as a dull sensation in the morning and noon.  Tylenol and lidocaine have not helped symptoms.  Patient denies numbness, tingling, injury.      Past Medical History:  Diagnosis Date   Acute bronchitis    Allergy    Anxiety    Arthritis    knees   Asthma    Backache, unspecified    Cataract    removed both eyes   Depression    Diverticulitis    Diverticulosis of colon (without mention of hemorrhage)    Edema    GERD (gastroesophageal reflux disease)    Glaucoma    Lumbago    Obesity    Other chest pain    Pain in joint, ankle and foot    Sarcoidosis    Tibialis tendinitis    Unspecified asthma(493.90)    Past Surgical History:  Procedure Laterality Date   ABDOMINAL HYSTERECTOMY     CATARACT EXTRACTION W/PHACO Right 06/24/2014   Procedure: CATARACT EXTRACTION PHACO AND INTRAOCULAR LENS PLACEMENT (IOC) RIGHT EYE WITH  GONIOSYNECHIALYSIS;  Surgeon: Chalmers Guest, MD;  Location: [facility] OR;  Service: Ophthalmology;  Laterality: Right;   COLONOSCOPY     EYE SURGERY Left    cataract surgery   HAND SURGERY Right    trigger finger release   IR RADIOLOGIST EVAL & MGMT  11/14/2018   KNEE ARTHROSCOPY Right    TOTAL KNEE ARTHROPLASTY Right 12/08/2021   Procedure: RIGHT TOTAL KNEE ARTHROPLASTY APPLICATION OF WOUND VAC;  Surgeon: Cammy Copa, MD;  Location: [facility] OR;  Service: Orthopedics;  Laterality: Right;   TOTAL KNEE ARTHROPLASTY Left 04/13/2022   Procedure: LEFT TOTAL KNEE ARTHROPLASTY;  Surgeon: Cammy Copa, MD;  Location: [facility] OR;  Service: Orthopedics;  Laterality: Left;   Social History   Tobacco Use   Smoking status: Never   Smokeless tobacco: Never  Vaping Use   Vaping Use: Never used  Substance Use Topics   Alcohol use: Yes    Comment: 1 glass of wine or mixed drink once a week   Drug use: No   Family History  Problem Relation Age of Onset   Hypertension Mother    Stroke Mother    Diabetes Father    Sarcoidosis Sister  Colon cancer Neg Hx    Colon polyps Neg Hx    Esophageal cancer Neg Hx    Rectal cancer Neg Hx    Stomach cancer Neg Hx    Allergies  Allergen Reactions   Invanz [Ertapenem] Swelling    "Lips swelled up"   Ibuprofen Rash   Sulfa Antibiotics Rash      ROS Negative unless stated above    Objective:     BP 118/68 (BP Location: Left Arm, Patient Position: Sitting, Cuff Size: Normal)   Pulse 97   Temp 98.5 F (36.9 C) (Oral)   SpO2 96%  BP Readings from Last 3 Encounters:  02/15/23 118/68  01/18/23 134/74  12/07/22 (!) 150/86   Wt Readings from Last 3 Encounters:  01/18/23 200 lb (90.7 kg)  12/07/22 185 lb 3.2 oz (84 kg)  11/29/22 185 lb (83.9 kg)      Physical Exam Constitutional:      General: She is not in acute distress.    Appearance: Normal appearance.  HENT:     Head: Normocephalic and atraumatic.     Nose: Nose normal.      Mouth/Throat:     Mouth: Mucous membranes are moist.  Cardiovascular:     Rate and Rhythm: Normal rate and regular rhythm.     Heart sounds: Normal heart sounds. No murmur heard.    No gallop.  Pulmonary:     Effort: Pulmonary effort is normal. No respiratory distress.     Breath sounds: Normal breath sounds. No wheezing, rhonchi or rales.  Musculoskeletal:       Arms:     Comments: TTP of left cervical trapezius and midline thoracic and upper lumbar spine.  Normal ROM of bilateral shoulders.  Mild arthritic angels in bilateral hands.  Skin:    General: Skin is warm and dry.     Comments: Ping-pong ball sized lipoma of left upper back.  Neurological:     Mental Status: She is alert and oriented to person, place, and time.     Cranial Nerves: Cranial nerves 2-12 are intact.     Comments: Sitting in transport wheelchair.  Able to stand and pivot.  Gait not assessed.      No results found for any visits on 02/15/23.    Assessment & Plan:  Essential hypertension -BP controlled in clinic -Elevation in BP at home possibly 2/2 age of BP cuff and being out of medication. -Continue Norvasc 5 mg daily. -Continue lifestyle modifications -     Blood Pressure Monitor; 1 Device by Does not apply route daily.  Dispense: 1 each; Refill: 0  Strain of left trapezius muscle, initial encounter -Likely 2/2 posture -Supportive care including sitting against back of chair, ergonomic workspace modifications, heat, massage, topical analgesics such as Biofreeze, Aspercreme, etc.  Acute midline low back pain without sciatica -Likely 2/2 posture and sedentary lifestyle -Discussed supportive care including sitting with back against chair, stretching, topical analgesics, Tylenol or NSAIDs sparingly.  Lipoma of back -Left upper back mid scapular -Pt notify clinic if interested in referral to dermatology for removal of lipoma.  Acute pain of left shoulder  Bilateral lower extremity  edema -Stable -For worsening symptoms consider stopping Norvasc if symptoms improve -Discussed elevating LEs when sitting and decreasing sodium intake. -Compression socks   Return if symptoms worsen or fail to improve.   On day of service, 33 minutes spent caring for this patient face-to-face, reviewing the chart, counseling and/or coordinating care for plan and  treatment of diagnosis below.     Deeann Saint, MD

## 2023-03-16 ENCOUNTER — Telehealth: Payer: Self-pay | Admitting: Physical Medicine and Rehabilitation

## 2023-03-16 NOTE — Telephone Encounter (Signed)
Patient called needing to schedule appointment with Dr. Alvester Morin for her neck. The number to contact patient is [phone]

## 2023-03-20 NOTE — Telephone Encounter (Signed)
This patient called in to see [facility] for her neck pain. I see she has seen Dr. August Saucer for her neck pain. Is it okay for her to see [name] for that?

## 2023-03-22 NOTE — Telephone Encounter (Signed)
LVM for patient togt clarification. Patient is scheduled with Dr. August Saucer on 03/29/23

## 2023-03-26 ENCOUNTER — Telehealth: Payer: Self-pay | Admitting: Physical Medicine and Rehabilitation

## 2023-03-26 NOTE — Telephone Encounter (Signed)
Patient called asked for a call back    [phone]

## 2023-03-26 NOTE — Telephone Encounter (Signed)
Patient called. Returning a call to Brittney.   

## 2023-03-26 NOTE — Telephone Encounter (Signed)
Attempted to call patient back, mail box full.

## 2023-03-29 ENCOUNTER — Ambulatory Visit: Payer: Self-pay | Admitting: Orthopedic Surgery

## 2023-04-19 ENCOUNTER — Ambulatory Visit: Payer: Medicare PPO | Admitting: Family Medicine

## 2023-04-19 NOTE — Progress Notes (Unsigned)
Office: [phone]  /  Fax: [phone]   TeleHealth Visit:  This visit was completed with telemedicine (audio/video) technology. Ta has verbally consented to this TeleHealth visit. The patient is located at home, the provider is located at home. The participants in this visit include the listed provider and patient. The visit was conducted today via MyChart video.  Initial Visit  Myrella Fahs Pyle was seen via virtual visit today to evaluate for treatment of obesity. She is interested in losing weight to improve overall health and reduce the risk of weight related complications. She presents today to review program treatment options, initial physical assessment, and evaluation.     Height: 5\' 0"  Weight: 185 lbs BMI: 36  She was referred by: PCP  When asked what else they would like to accomplish? She states: {EMHopetoaccomplish:28304}  Weight history: ***  When asked how has your weight affected you? She states: {EMWeightAffected:28305}  Some associated conditions: Hypertension and Arthritis:knees  Contributing factors: {EMcontributingfactors:28307}  Weight promoting medications identified: Psychotropic medications and Antiepileptics  Current nutrition plan: {EMNutritionplan:28309::"None"}  Current level of physical activity: {EMcurrentPA:28310::"None"}  Current or previous pharmacotherapy: {EM previousRx:28311}  Response to medication: {EMResponsetomedication:28312}  Past medical history includes:   Past Medical History:  Diagnosis Date   Acute bronchitis    Allergy    Anxiety    Arthritis    knees   Asthma    Backache, unspecified    Cataract    removed both eyes   Depression    Diverticulitis    Diverticulosis of colon (without mention of hemorrhage)    Edema    GERD (gastroesophageal reflux disease)    Glaucoma    Lumbago    Obesity    Other chest pain    Pain in joint, ankle and foot    Sarcoidosis    Tibialis tendinitis    Unspecified  asthma(493.90)      Objective:     General:  Alert, oriented and cooperative. Patient is in no acute distress.  Respiratory: Normal respiratory effort, no problems with respiration noted  Mental Status: Normal mood and affect. Normal behavior. Normal judgment and thought content.    Assessment and Plan:   1. Hypertension Hypertension reasonably well controlled.  Medication(s): amlodipine 5 mg daily  BP Readings from Last 3 Encounters:  02/15/23 118/68  01/18/23 134/74  12/07/22 (!) 150/86   Lab Results  Component Value Date   CREATININE 0.43 12/07/2022   CREATININE 0.46 11/30/2022   CREATININE 0.58 11/29/2022   Lab Results  Component Value Date   GFR 96.37 12/07/2022   GFR 97.22 06/26/2022   GFR 83.60 08/17/2021    Plan: Continue all antihypertensives at current dosages. Work on lifestyle interventions in conjunction with our program.   2. Obesity Treatment / Action Plan:  Will complete provided nutritional and psychosocial assessment questionnaire before the next appointment. Will be scheduled for indirect calorimetry to determine resting energy expenditure in a fasting state.  This will allow [name] to create a reduced calorie, high-protein meal plan to promote loss of fat mass while preserving muscle mass. Was counseled on pharmacotherapy and role as an adjunct in weight management.   Obesity Education Performed Today:  We discussed obesity as a disease and the importance of a more detailed evaluation of all the factors contributing to the disease.  We discussed the importance of long term lifestyle changes which include nutrition, exercise and behavioral modifications as well as the importance of customizing this to her specific health and social  needs.  We discussed the benefits of reaching a healthier weight to alleviate the symptoms of existing conditions and reduce the risks of the biomechanical, metabolic and psychological effects of obesity.  Zannie Cove  [place] appears to be in the action stage of change and states they are ready to start intensive lifestyle modifications and behavioral modifications.  ______________________________________________________________________________  She will be contacted by [facility] to set up initial appointment and the first follow up appointment with a physician.   The following office policies were discussed. She voiced understanding: - Patient will be considered late at 6 minutes past appointment time.   - For the first office visit, patient needs to arrive 1 hour early, fasting except for water. Patient should arrive 15 minutes early for all other visits. -  Patient will bring completed new patient paperwork to first office visit.  If not, appointment will be rescheduled.  30 minutes was spent today on this visit including the above counseling, pre-visit chart review, and post-visit documentation.  Reviewed by clinician on day of visit: allergies, medications, problem list, medical history, surgical history, family history, social history, and previous encounter notes pertinent to obesity diagnosis.    Jesse Sans, FNP

## 2023-04-23 ENCOUNTER — Encounter (INDEPENDENT_AMBULATORY_CARE_PROVIDER_SITE_OTHER): Payer: Self-pay | Admitting: Family Medicine

## 2023-04-23 ENCOUNTER — Telehealth (INDEPENDENT_AMBULATORY_CARE_PROVIDER_SITE_OTHER): Payer: Medicare PPO | Admitting: Family Medicine

## 2023-04-23 DIAGNOSIS — I10 Essential (primary) hypertension: Secondary | ICD-10-CM

## 2023-04-23 DIAGNOSIS — M069 Rheumatoid arthritis, unspecified: Secondary | ICD-10-CM | POA: Diagnosis not present

## 2023-04-23 DIAGNOSIS — Z6836 Body mass index (BMI) 36.0-36.9, adult: Secondary | ICD-10-CM

## 2023-04-23 DIAGNOSIS — E669 Obesity, unspecified: Secondary | ICD-10-CM | POA: Diagnosis not present

## 2023-04-23 DIAGNOSIS — Z0289 Encounter for other administrative examinations: Secondary | ICD-10-CM

## 2023-04-25 ENCOUNTER — Ambulatory Visit (INDEPENDENT_AMBULATORY_CARE_PROVIDER_SITE_OTHER): Payer: Medicare PPO | Admitting: Internal Medicine

## 2023-05-03 ENCOUNTER — Encounter (HOSPITAL_BASED_OUTPATIENT_CLINIC_OR_DEPARTMENT_OTHER): Payer: Self-pay | Admitting: Emergency Medicine

## 2023-05-03 ENCOUNTER — Emergency Department (HOSPITAL_BASED_OUTPATIENT_CLINIC_OR_DEPARTMENT_OTHER): Payer: Medicare PPO | Admitting: Radiology

## 2023-05-03 ENCOUNTER — Other Ambulatory Visit: Payer: Self-pay

## 2023-05-03 ENCOUNTER — Emergency Department (HOSPITAL_BASED_OUTPATIENT_CLINIC_OR_DEPARTMENT_OTHER): Payer: Medicare PPO

## 2023-05-03 ENCOUNTER — Emergency Department (HOSPITAL_BASED_OUTPATIENT_CLINIC_OR_DEPARTMENT_OTHER)
Admission: EM | Admit: 2023-05-03 | Discharge: 2023-05-03 | Disposition: A | Discharge status: Home / Self Care | Payer: Medicare PPO | Attending: Emergency Medicine | Admitting: Emergency Medicine

## 2023-05-03 DIAGNOSIS — D72819 Decreased white blood cell count, unspecified: Secondary | ICD-10-CM | POA: Insufficient documentation

## 2023-05-03 DIAGNOSIS — R0602 Shortness of breath: Secondary | ICD-10-CM | POA: Insufficient documentation

## 2023-05-03 DIAGNOSIS — R062 Wheezing: Secondary | ICD-10-CM | POA: Diagnosis not present

## 2023-05-03 DIAGNOSIS — R600 Localized edema: Secondary | ICD-10-CM | POA: Insufficient documentation

## 2023-05-03 DIAGNOSIS — M542 Cervicalgia: Secondary | ICD-10-CM | POA: Diagnosis not present

## 2023-05-03 DIAGNOSIS — Z7982 Long term (current) use of aspirin: Secondary | ICD-10-CM | POA: Insufficient documentation

## 2023-05-03 DIAGNOSIS — M79604 Pain in right leg: Secondary | ICD-10-CM | POA: Diagnosis not present

## 2023-05-03 DIAGNOSIS — R0989 Other specified symptoms and signs involving the circulatory and respiratory systems: Secondary | ICD-10-CM | POA: Diagnosis not present

## 2023-05-03 DIAGNOSIS — M79605 Pain in left leg: Secondary | ICD-10-CM | POA: Diagnosis not present

## 2023-05-03 LAB — COMPREHENSIVE METABOLIC PANEL
ALT: 11 U/L (ref 0–44)
AST: 15 U/L (ref 15–41)
Albumin: 3.9 g/dL (ref 3.5–5.0)
Alkaline Phosphatase: 53 U/L (ref 38–126)
Anion gap: 7 (ref 5–15)
BUN: 7 mg/dL — ABNORMAL LOW (ref 8–23)
CO2: 29 mmol/L (ref 22–32)
Calcium: 8.8 mg/dL — ABNORMAL LOW (ref 8.9–10.3)
Chloride: 102 mmol/L (ref 98–111)
Creatinine, Ser: 0.58 mg/dL (ref 0.44–1.00)
GFR, Estimated: >60 mL/min (ref >60)
Glucose, Bld: 148 mg/dL — ABNORMAL HIGH (ref 70–99)
Potassium: 4 mmol/L (ref 3.5–5.1)
Sodium: 138 mmol/L (ref 135–145)
Total Bilirubin: 0.6 mg/dL (ref 0.3–1.2)
Total Protein: 6.4 g/dL — ABNORMAL LOW (ref 6.5–8.1)

## 2023-05-03 LAB — CBC WITH DIFFERENTIAL/PLATELET
Abs Immature Granulocytes: 0 10*3/uL (ref 0.00–0.07)
Basophils Absolute: 0 10*3/uL (ref 0.0–0.1)
Basophils Relative: 1 %
Eosinophils Absolute: 0.2 10*3/uL (ref 0.0–0.5)
Eosinophils Relative: 5 %
HCT: 38.2 % (ref 36.0–46.0)
Hemoglobin: 11.8 g/dL — ABNORMAL LOW (ref 12.0–15.0)
Immature Granulocytes: 0 %
Lymphocytes Relative: 48 %
Lymphs Abs: 1.7 10*3/uL (ref 0.7–4.0)
MCH: 31.4 pg (ref 26.0–34.0)
MCHC: 30.9 g/dL (ref 30.0–36.0)
MCV: 101.6 fL — ABNORMAL HIGH (ref 80.0–100.0)
Monocytes Absolute: 0.4 10*3/uL (ref 0.1–1.0)
Monocytes Relative: 11 %
Neutro Abs: 1.2 10*3/uL — ABNORMAL LOW (ref 1.7–7.7)
Neutrophils Relative %: 35 %
Platelets: 226 10*3/uL (ref 150–400)
RBC: 3.76 MIL/uL — ABNORMAL LOW (ref 3.87–5.11)
RDW: 15.1 % (ref 11.5–15.5)
WBC: 3.5 10*3/uL — ABNORMAL LOW (ref 4.0–10.5)
nRBC: 0 % (ref 0.0–0.2)

## 2023-05-03 LAB — BRAIN NATRIURETIC PEPTIDE: B Natriuretic Peptide: 21.2 pg/mL (ref 0.0–100.0)

## 2023-05-03 MED ORDER — ALBUTEROL SULFATE HFA 108 (90 BASE) MCG/ACT IN AERS
2.0000 | INHALATION_SPRAY | Freq: Once | RESPIRATORY_TRACT | Status: AC
  Administered 2023-05-03: 2 via RESPIRATORY_TRACT
  Filled 2023-05-03: qty 6.7

## 2023-05-03 NOTE — ED Provider Notes (Signed)
[place] EMERGENCY DEPARTMENT AT [facility] Provider Note   CSN: [id number] Arrival date & time: 05/03/23  1215     History  Chief Complaint  Patient presents with   Joint Swelling    [name] is a 72 y.o. female.  She has a history of sarcoidosis rheumatoid arthritis is on chronic prednisone.  For the last 5 days she has had increased swelling of her ankles and feeling short of breath, wheezing.  No trauma no chest pain no fever or cough.  No abdominal pain vomiting diarrhea.  She has also had some pain in her neck for the last month and was going to go see her orthopedic surgeon for that.  No trauma.  The history is provided by the patient.  Shortness of Breath Severity:  Moderate Onset quality:  Gradual Duration:  5 days Timing:  Intermittent Progression:  Unchanged Chronicity:  New Relieved by:  Rest Worsened by:  Activity Ineffective treatments:  None tried Associated symptoms: wheezing   Associated symptoms: no chest pain, no cough, no diaphoresis, no fever, no sputum production and no vomiting   Risk factors: no tobacco use        Home Medications Prior to Admission medications   Medication Sig Start Date End Date Taking? Authorizing Provider  acetaminophen (TYLENOL) 500 MG tablet Take 1,000 mg by mouth in the morning and at bedtime.    [provider]  albuterol (VENTOLIN HFA) 108 (90 Base) MCG/ACT inhaler TAKE 2 PUFFS BY MOUTH EVERY 6 HOURS AS NEEDED Patient taking differently: Inhale 2 puffs into the lungs every 6 (six) hours as needed for wheezing or shortness of breath. TAKE 2 PUFFS BY MOUTH EVERY 6 HOURS AS NEEDED 06/26/22   Deeann Saint, MD  alendronate (FOSAMAX) 70 MG tablet Take 70 mg by mouth every Monday.    [provider]  amLODipine (NORVASC) 5 MG tablet Take 1 tablet (5 mg total) by mouth daily. 02/09/23   Deeann Saint, MD  amoxicillin (AMOXIL) 500 MG capsule Take 2,000 mg by mouth See admin instructions. Take  2000mg  by mouth 1 hour before appt.    [provider]  aspirin EC 325 MG tablet Take 325 mg by mouth daily. 11/28/22   [provider]  Blood Pressure Monitor MISC 1 Device by Does not apply route daily. 02/15/23   Deeann Saint, MD  cyanocobalamin (VITAMIN B12) 1000 MCG/ML injection Inject 1,000 mcg into the muscle every 30 (thirty) days. 10/31/22   [provider]  cycloSPORINE (RESTASIS) 0.05 % ophthalmic emulsion Place 1 drop into both eyes 2 (two) times daily.    [provider]  DULoxetine (CYMBALTA) 30 MG capsule Take 1 capsule (30 mg total) by mouth daily. 11/19/22 01/18/23  Barnetta Chapel, MD  fluticasone (FLONASE) 50 MCG/ACT nasal spray Place 1 spray into both nostrils daily. 08/17/21   Deeann Saint, MD  folic acid (FOLVITE) 1 MG tablet Take 1 mg by mouth daily. 10/31/22   [provider]  gabapentin (NEURONTIN) 300 MG capsule Take 300 mg by mouth 2 (two) times daily.    [provider]  KEVZARA 200 MG/1. SOAJ Inject into the skin. 02/12/23   [provider]  levocetirizine (XYZAL ALLERGY 24HR) 5 MG tablet Take 1 tablet (5 mg total) by mouth every evening. 08/17/21   Deeann Saint, MD  methotrexate (RHEUMATREX) 2.5 MG tablet Take 20 mg by mouth every Wednesday. 06/13/22   [provider]  pantoprazole (  PROTONIX) 40 MG tablet Take 1 tablet (40 mg total) by mouth daily. 11/19/22 12/19/22  Berton Mount I, MD  polyethylene glycol powder (GLYCOLAX/MIRALAX) 17 GM/SCOOP powder Take 17 g by mouth daily as needed. 08/17/21   Deeann Saint, MD  predniSONE (DELTASONE) 5 MG tablet Take 5 mg by mouth daily.    [provider]  Vitamin D, Ergocalciferol, (DRISDOL) 1.25 MG (50000 UNIT) CAPS capsule Take 1 capsule (50,000 Units total) by mouth every 7 (seven) days. Patient taking differently: Take 50,000 Units by mouth every Wednesday. 05/26/21   Deeann Saint, MD  WIXELA INHUB 250-50 MCG/ACT AEPB INHALE 1  PUFF BY MOUTH TWICE A DAY Patient taking differently: Inhale 1 puff into the lungs in the morning and at bedtime. 06/07/21   Deeann Saint, MD      Allergies    Invanz [ertapenem], Ibuprofen, and Sulfa antibiotics    Review of Systems   Review of Systems  Constitutional:  Negative for diaphoresis and fever.  Respiratory:  Positive for shortness of breath and wheezing. Negative for cough and sputum production.   Cardiovascular:  Positive for leg swelling. Negative for chest pain.  Gastrointestinal:  Negative for vomiting.  Genitourinary:  Negative for dysuria.    Physical Exam Updated Vital Signs BP 139/70 (BP Location: Left Arm)   Pulse 95   Temp 98 F (36.7 C)   Resp 17   Ht 5' (1.524 m)   Wt 90.7 kg   SpO2 95%   BMI 39.06 kg/m  Physical Exam Vitals and nursing note reviewed.  Constitutional:      General: She is not in acute distress.    Appearance: Normal appearance. She is well-developed.  HENT:     Head: Normocephalic and atraumatic.  Eyes:     Conjunctiva/sclera: Conjunctivae normal.  Cardiovascular:     Rate and Rhythm: Normal rate and regular rhythm.     Heart sounds: No murmur heard. Pulmonary:     Effort: Pulmonary effort is normal. No respiratory distress.     Breath sounds: Normal breath sounds.  Abdominal:     Palpations: Abdomen is soft.     Tenderness: There is no abdominal tenderness. There is no guarding or rebound.  Musculoskeletal:        General: No tenderness. Normal range of motion.     Cervical back: Neck supple.     Right lower leg: Edema present.     Left lower leg: Edema present.  Skin:    General: Skin is warm and dry.     Capillary Refill: Capillary refill takes less than 2 seconds.  Neurological:     General: No focal deficit present.     Mental Status: She is alert.     Sensory: No sensory deficit.     Motor: No weakness.     ED Results / Procedures / Treatments   Labs (all labs ordered are listed, but only abnormal  results are displayed) Labs Reviewed  CBC WITH DIFFERENTIAL/PLATELET - Abnormal; Notable for the following components:      Result Value   WBC 3.5 (*)    RBC 3.76 (*)    Hemoglobin 11.8 (*)    MCV 101.6 (*)    Neutro Abs 1.2 (*)    All other components within normal limits  COMPREHENSIVE METABOLIC PANEL - Abnormal; Notable for the following components:   Glucose, Bld 148 (*)    BUN 7 (*)    Calcium 8.8 (*)    Total  Protein 6.4 (*)    All other components within normal limits  BRAIN NATRIURETIC PEPTIDE    EKG EKG Interpretation Date/Time:  Thursday May 03 2023 14:45:25 EDT Ventricular Rate:  74 PR Interval:  167 QRS Duration:  96 QT Interval:  373 QTC Calculation: 414 R Axis:   40  Text Interpretation: Sinus rhythm Probable left atrial enlargement Low voltage, precordial leads ST elevation, consider inferior injury No significant change since prior 2/24 Confirmed by Meridee Score [phone]) on 05/03/2023 2:47:43 PM  Radiology US Venous Img Lower Bilateral (DVT)  Result Date: 05/03/2023 CLINICAL DATA:  Bilateral lower extremity pain and edema for the past 5 days. Evaluate for DVT. EXAM: BILATERAL LOWER EXTREMITY VENOUS DOPPLER ULTRASOUND TECHNIQUE: Gray-scale sonography with graded compression, as well as color Doppler and duplex ultrasound were performed to evaluate the lower extremity deep venous systems from the level of the common femoral vein and including the common femoral, femoral, profunda femoral, popliteal and calf veins including the posterior tibial, peroneal and gastrocnemius veins when visible. The superficial great saphenous vein was also interrogated. Spectral Doppler was utilized to evaluate flow at rest and with distal augmentation maneuvers in the common femoral, femoral and popliteal veins. COMPARISON:  None Available. FINDINGS: RIGHT LOWER EXTREMITY Common Femoral Vein: No evidence of thrombus. Normal compressibility, respiratory phasicity and response to  augmentation. Saphenofemoral Junction: No evidence of thrombus. Normal compressibility and flow on color Doppler imaging. Profunda Femoral Vein: No evidence of thrombus. Normal compressibility and flow on color Doppler imaging. Femoral Vein: No evidence of thrombus. Normal compressibility, respiratory phasicity and response to augmentation. Popliteal Vein: No evidence of thrombus. Normal compressibility, respiratory phasicity and response to augmentation. Calf Veins: No evidence of thrombus. Normal compressibility and flow on color Doppler imaging. Superficial Great Saphenous Vein: No evidence of thrombus. Normal compressibility. Other Findings:  None. LEFT LOWER EXTREMITY Common Femoral Vein: No evidence of thrombus. Normal compressibility, respiratory phasicity and response to augmentation. Saphenofemoral Junction: No evidence of thrombus. Normal compressibility and flow on color Doppler imaging. Profunda Femoral Vein: No evidence of thrombus. Normal compressibility and flow on color Doppler imaging. Femoral Vein: No evidence of thrombus. Normal compressibility, respiratory phasicity and response to augmentation. Popliteal Vein: No evidence of thrombus. Normal compressibility, respiratory phasicity and response to augmentation. Calf Veins: No evidence of thrombus. Normal compressibility and flow on color Doppler imaging. Superficial Great Saphenous Vein: No evidence of thrombus. Normal compressibility. Other Findings:  None. IMPRESSION: No evidence of DVT within either lower extremity. Electronically Signed   By: Simonne Come M.D.   On: 05/03/2023 14:35   DG Chest 2 View  Result Date: 05/03/2023 CLINICAL DATA:  Wheezing. EXAM: CHEST - 2 VIEW COMPARISON:  Chest x-ray February 28, 24. FINDINGS: Low lung volumes and patient rotation.  Within these limitations: The heart size and mediastinal contours are within normal limits. Both lungs are clear. No visible pleural effusions or pneumothorax. No acute osseous  abnormality. IMPRESSION: No active cardiopulmonary disease. Electronically Signed   By: Feliberto Harts M.D.   On: 05/03/2023 13:39    Procedures Procedures    Medications Ordered in ED Medications - No data to display  ED Course/ Medical Decision Making/ A&P Clinical Course as of 05/03/23 1740  Thu May 03, 2023  1312 Chest x-ray interpreted by me as no clear infiltrate.  Awaiting radiology reading. [MB]    Clinical Course User Index [MB] Terrilee Files, MD  Medical Decision Making Amount and/or Complexity of Data Reviewed Labs: ordered. Radiology: ordered.  Risk Prescription drug management.   This patient complains of swelling on her ankles, shortness of breath and wheezing; this involves an extensive number of treatment Options and is a complaint that carries with it a high risk of complications and morbidity. The differential includes peripheral edema, CHF, DVT, PE, pneumonia, asthma  I ordered, reviewed and interpreted labs, which included CBC with a low white count stable hemoglobin, chemistries with mildly elevated glucose, BNP normal I ordered medication albuterol breathing treatment and reviewed PMP when indicated. I ordered imaging studies which included chest x-ray and I independently    visualized and interpreted imaging which showed no acute findings Previous records obtained and reviewed in epic including recent orthopedics and primary care notes Cardiac monitoring reviewed, normal sinus rhythm Social determinants considered, patient with transportation barriers Critical Interventions: None  After the interventions stated above, I reevaluated the patient and found patient to be symptomatically improved after breathing treatment Admission and further testing considered, no indications for admission at this time.  Counseled patient to use low-salt diet and compression stockings.  Will have her follow-up with her PCP to discuss  diuretics.  Return instructions discussed         Final Clinical Impression(s) / ED Diagnoses Final diagnoses:  SOB (shortness of breath)  Peripheral edema    Rx / DC Orders ED Discharge Orders     None         Terrilee Files, MD 05/03/23 1742

## 2023-05-03 NOTE — ED Notes (Signed)
ED Provider at bedside. 

## 2023-05-03 NOTE — ED Notes (Signed)
To room via wheelchair

## 2023-05-03 NOTE — ED Triage Notes (Signed)
Patient endorses bilateral ankle swelling x 5 days, worse on the left as well as wheezing that started today. Patient also reports the arthritis in her neck is bothering her.

## 2023-05-03 NOTE — Discharge Instructions (Signed)
You are seen in the emergency department for shortness of breath and increased swelling in your legs.  You had blood work EKG chest x-ray and an ultrasound of your legs.  There was no evidence of a blood clot.  There is also no evidence of congestive heart failure.  Please start with a low salt diet, elevate your legs and you can also try compression stockings.  You will need follow-up with your primary care doctor to see if you need to go on a fluid pill.  Please also use the albuterol inhaler 2 puffs every 6 hours as needed for shortness of breath/wheezing.  Return if any worsening or concerning symptoms.

## 2023-05-03 NOTE — ED Notes (Signed)
 RN reviewed discharge instructions with pt. Pt verbalized understanding and had no further questions. VSS upon discharge.  

## 2023-05-07 ENCOUNTER — Encounter: Payer: Self-pay | Admitting: Family Medicine

## 2023-05-07 ENCOUNTER — Ambulatory Visit: Payer: Medicare PPO | Admitting: Family Medicine

## 2023-05-07 VITALS — BP 132/88 | HR 100 | Temp 97.2°F | Wt 203.0 lb

## 2023-05-07 DIAGNOSIS — J4530 Mild persistent asthma, uncomplicated: Secondary | ICD-10-CM

## 2023-05-07 DIAGNOSIS — R600 Localized edema: Secondary | ICD-10-CM

## 2023-05-07 DIAGNOSIS — M059 Rheumatoid arthritis with rheumatoid factor, unspecified: Secondary | ICD-10-CM

## 2023-05-07 DIAGNOSIS — E538 Deficiency of other specified B group vitamins: Secondary | ICD-10-CM

## 2023-05-07 MED ORDER — CYANOCOBALAMIN 1000 MCG/ML IJ SOLN
1000.0000 ug | Freq: Once | INTRAMUSCULAR | Status: AC
Start: 2023-05-07 — End: 2023-05-07
  Administered 2023-05-07: 1000 ug via INTRAMUSCULAR

## 2023-05-07 NOTE — Progress Notes (Signed)
Established Patient Office Visit   Subjective  Patient ID: [name], female    DOB: [birth year]  Age: 72 y.o. MRN: [id number]  Chief Complaint  Patient presents with   Medical Management of Chronic Issues    SOB and swelling, feet have gone down some but not much in the left foot. Was not sure if it may be something with RA. But has been having the wheezing for about a week, was given something while in ED.     HPI Patient is a 72 year old female seen for follow-up on chronic issues.  Patient seen in ED on 05/03/2023 for SOB and peripheral edema.  Imaging without acute finding.  CBC, BMP and chemistries largely normal.  Doppler negative for DVT.  Patient mentions having compression socks/TED hose at home however has difficulty getting them on and off due to RA.  Patient may have had more sodium prior to ED visit as it was her birthday.  Patient states she ate ruffles and [employer] as well as food others prepared for her.  Patient typically sits in wheelchair during the day.  Patient has noticed some improvement in edema.  Patient notes continued RA symptoms.  Currently having neck pain.  Is unsure which medications she is supposed to be taking.  Had difficulty giving herself Kevzara injection.  Since ED visit patient notes improvement in SOB.  States wheezing was heard by others but she could not tell.  Patient has Wixela and albuterol inhaler however she finds it difficult to press canister due to RA.  Also not able to use Flonase inhaler for same reason.  Patient notes being given cyanocobalamine for low B12 however was not given any needles.  Patient states she does not feel like she can give herself the injection.  Unsure last time B12 was checked.  Patient mentions she is no longer taking gabapentin.   Past Medical History:  Diagnosis Date   Acute bronchitis    Allergy    Anxiety    Arthritis    knees   Asthma    Backache, unspecified    Cataract    removed both eyes    Depression    Diverticulitis    Diverticulosis of colon (without mention of hemorrhage)    Edema    GERD (gastroesophageal reflux disease)    Glaucoma    Lumbago    Obesity    Other chest pain    Pain in joint, ankle and foot    Sarcoidosis    Tibialis tendinitis    Unspecified asthma(493.90)    Past Surgical History:  Procedure Laterality Date   ABDOMINAL HYSTERECTOMY     CATARACT EXTRACTION W/PHACO Right 06/24/2014   Procedure: CATARACT EXTRACTION PHACO AND INTRAOCULAR LENS PLACEMENT (IOC) RIGHT EYE WITH GONIOSYNECHIALYSIS;  Surgeon: Chalmers Guest, MD;  Location: [facility] OR;  Service: Ophthalmology;  Laterality: Right;   COLONOSCOPY     EYE SURGERY Left    cataract surgery   HAND SURGERY Right    trigger finger release   IR RADIOLOGIST EVAL & MGMT  11/14/2018   KNEE ARTHROSCOPY Right    TOTAL KNEE ARTHROPLASTY Right 12/08/2021   Procedure: RIGHT TOTAL KNEE ARTHROPLASTY APPLICATION OF WOUND VAC;  Surgeon: Cammy Copa, MD;  Location: [facility] OR;  Service: Orthopedics;  Laterality: Right;   TOTAL KNEE ARTHROPLASTY Left 04/13/2022   Procedure: LEFT TOTAL KNEE ARTHROPLASTY;  Surgeon: Cammy Copa, MD;  Location: [facility] OR;  Service: Orthopedics;  Laterality: Left;   Social History  Tobacco Use   Smoking status: Never   Smokeless tobacco: Never  Vaping Use   Vaping status: Never Used  Substance Use Topics   Alcohol use: Yes    Comment: 1 glass of wine or mixed drink once a week   Drug use: No   Family History  Problem Relation Age of Onset   Hypertension Mother    Stroke Mother    Diabetes Father    Sarcoidosis Sister    Colon cancer Neg Hx    Colon polyps Neg Hx    Esophageal cancer Neg Hx    Rectal cancer Neg Hx    Stomach cancer Neg Hx    Allergies  Allergen Reactions   Invanz [Ertapenem] Swelling    "Lips swelled up"   Ibuprofen Rash   Sulfa Antibiotics Rash      ROS Negative unless stated above    Objective:     BP 132/88 (BP Location: Left Arm,  Patient Position: Sitting, Cuff Size: Normal)   Pulse 100   Temp (!) 97.2 F (36.2 C) (Temporal)   Wt 203 lb (92.1 kg)   SpO2 94%   BMI 39.65 kg/m  BP Readings from Last 3 Encounters:  05/07/23 132/88  05/03/23 132/86  02/15/23 118/68   Wt Readings from Last 3 Encounters:  05/07/23 203 lb (92.1 kg)  05/03/23 200 lb (90.7 kg)  01/18/23 200 lb (90.7 kg)      Physical Exam Constitutional:      General: She is not in acute distress.    Appearance: Normal appearance.  HENT:     Head: Normocephalic and atraumatic.     Nose: Nose normal.     Mouth/Throat:     Mouth: Mucous membranes are moist.  Cardiovascular:     Rate and Rhythm: Normal rate and regular rhythm.     Heart sounds: Normal heart sounds. No murmur heard.    No gallop.     Comments: Trace edema bilaterally LEs to mid shin. Pulmonary:     Effort: Pulmonary effort is normal. No respiratory distress.     Breath sounds: Normal breath sounds. No wheezing, rhonchi or rales.  Skin:    General: Skin is warm and dry.  Neurological:     Mental Status: She is alert and oriented to person, place, and time.     Comments: Gait not assessed as patient sitting in transport wheelchair.    No results found for any visits on 05/07/23.    Assessment & Plan:  Peripheral edema -Improving -Dependent edema likely 2/2 sedentary lifestyle -BMP from 05/03/2023 normal. -Supportive care including elevation, compression socks or TED hose-though difficult for patient to get off and on, limiting sodium intake, etc. -Consider vascular referral for continued or worsening symptoms.  Rheumatoid arthritis with positive rheumatoid factor, involving unspecified site [facility]) -Patient encouraged to follow-up with rheumatology given confusion regarding medications.  Mild persistent asthma without complication -pt having difficulty using inhalers due to RA.  Can speak to pharmacist about recommendations for inhalers easier to use. -For now try to  continue Wiexela 250-50 mcg and albuterol.  B12 deficiency -Hemoglobin 11.8 and MCV 101.6 on 05/03/2023 -For continued B12 deficiency.  Also consider folate deficiency as previously on methotrexate however still taking provide -Obtain vitamin B12 level at next OFV if not recently done by specialist. -Given vitamin B12 injection in clinic and showed how to use. -     Cyanocobalamin  Return if symptoms worsen or fail to improve.   Bettey Mare  Salomon Fick, MD

## 2023-05-09 DIAGNOSIS — H402213 Chronic angle-closure glaucoma, right eye, severe stage: Secondary | ICD-10-CM | POA: Diagnosis not present

## 2023-05-09 DIAGNOSIS — H16223 Keratoconjunctivitis sicca, not specified as Sjogren's, bilateral: Secondary | ICD-10-CM | POA: Diagnosis not present

## 2023-05-09 DIAGNOSIS — H402222 Chronic angle-closure glaucoma, left eye, moderate stage: Secondary | ICD-10-CM | POA: Diagnosis not present

## 2023-05-09 DIAGNOSIS — H26491 Other secondary cataract, right eye: Secondary | ICD-10-CM | POA: Diagnosis not present

## 2023-05-10 ENCOUNTER — Other Ambulatory Visit: Payer: Self-pay | Admitting: Family Medicine

## 2023-05-14 DIAGNOSIS — Z79899 Other long term (current) drug therapy: Secondary | ICD-10-CM | POA: Diagnosis not present

## 2023-05-22 ENCOUNTER — Encounter (INDEPENDENT_AMBULATORY_CARE_PROVIDER_SITE_OTHER): Payer: Self-pay | Admitting: Internal Medicine

## 2023-05-22 ENCOUNTER — Ambulatory Visit (INDEPENDENT_AMBULATORY_CARE_PROVIDER_SITE_OTHER): Payer: Medicare PPO | Admitting: Internal Medicine

## 2023-05-22 VITALS — BP 136/86 | HR 84 | Temp 97.8°F | Ht <= 58 in | Wt 202.0 lb

## 2023-05-22 DIAGNOSIS — R29818 Other symptoms and signs involving the nervous system: Secondary | ICD-10-CM | POA: Diagnosis not present

## 2023-05-22 DIAGNOSIS — Z6841 Body Mass Index (BMI) 40.0 and over, adult: Secondary | ICD-10-CM

## 2023-05-22 DIAGNOSIS — R635 Abnormal weight gain: Secondary | ICD-10-CM | POA: Diagnosis not present

## 2023-05-22 DIAGNOSIS — R5383 Other fatigue: Secondary | ICD-10-CM | POA: Diagnosis not present

## 2023-05-22 DIAGNOSIS — Z1331 Encounter for screening for depression: Secondary | ICD-10-CM | POA: Insufficient documentation

## 2023-05-22 DIAGNOSIS — R5382 Chronic fatigue, unspecified: Secondary | ICD-10-CM | POA: Insufficient documentation

## 2023-05-22 DIAGNOSIS — R948 Abnormal results of function studies of other organs and systems: Secondary | ICD-10-CM | POA: Insufficient documentation

## 2023-05-22 DIAGNOSIS — T50905A Adverse effect of unspecified drugs, medicaments and biological substances, initial encounter: Secondary | ICD-10-CM

## 2023-05-22 DIAGNOSIS — Z01419 Encounter for gynecological examination (general) (routine) without abnormal findings: Secondary | ICD-10-CM | POA: Diagnosis not present

## 2023-05-22 DIAGNOSIS — R0602 Shortness of breath: Secondary | ICD-10-CM | POA: Diagnosis not present

## 2023-05-22 DIAGNOSIS — E538 Deficiency of other specified B group vitamins: Secondary | ICD-10-CM | POA: Diagnosis not present

## 2023-05-22 DIAGNOSIS — D649 Anemia, unspecified: Secondary | ICD-10-CM

## 2023-05-22 DIAGNOSIS — F32A Depression, unspecified: Secondary | ICD-10-CM

## 2023-05-22 DIAGNOSIS — Z1231 Encounter for screening mammogram for malignant neoplasm of breast: Secondary | ICD-10-CM | POA: Diagnosis not present

## 2023-05-22 NOTE — Assessment & Plan Note (Signed)
Contributing factors: Age, postmenopausal status, chronic disease, obesogenic medications, impaired mobility, chronic skipping of meals.  Limited ability to prepare meals due to chronic conditions.  Patient provided with multiple options including reduced calorie nutrition plan targeting 1000 cal/day 80 to 90 g of protein, list of protein shakes that she may use as a meal replacement and prepackaged food services.

## 2023-05-22 NOTE — Assessment & Plan Note (Signed)
Patient has a slower than predicted metabolism. IC 1267 vs. calculated 1390.  Likely secondary to medications, chronic disease and low volume of physical activity.  This may contribute to weight gain, chronic fatigue and difficulty losing weight.  We reviewed measures to improve metabolism including not skipping meals, progressive strengthening exercises, increasing protein intake at every meal and maintaining adequate hydration and sleep.

## 2023-05-22 NOTE — Assessment & Plan Note (Signed)
Patient on antiepileptics, steroids and immunomodulators.

## 2023-05-22 NOTE — Assessment & Plan Note (Signed)
Reviewed previous labs she had a low normal B12 level she has an increased MCV which may be related to methotrexate.  We will check homocystine and methylmalonic acid to confirm either folic acid deficiency or B12 deficiency.

## 2023-05-22 NOTE — Assessment & Plan Note (Signed)
She has an elevated Epworth of 12 and has symptoms of OSA.  We will do further screening at the next office visit and referred for polysomnography if patient is agreeable.

## 2023-05-22 NOTE — Assessment & Plan Note (Signed)
She had a hemoglobin of 11.8 with increased MCV which may be secondary to methotrexate.  We will check iron profile, methylmalonic acid acid and homocystine levels.  Not sure if she is taking folic acid along with methotrexate.  We will also review history of endoscopies.

## 2023-05-22 NOTE — Progress Notes (Signed)
Chief Complaint:   OBESITY [name] (MR# [id number]) is a 72 y.o. female who presents for evaluation and treatment of obesity and related comorbidities. Current BMI is Body mass index is 42.22 kg/m. [name] has been struggling with her weight for many years and has been unsuccessful in either losing weight, maintaining weight loss, or reaching her healthy weight goal.  [name] is currently in the action stage of change and ready to dedicate time achieving and maintaining a healthier weight. [name] is interested in becoming our patient and working on intensive lifestyle modifications including (but not limited to) diet and exercise for weight loss.  [name]'s habits were reviewed today and are as follows: she struggles with family and or coworkers weight loss sabotage, her desired weight loss is 42 lbs, she started gaining weight in 1986, her heaviest weight ever was 220 pounds, she has significant food cravings issues, she snacks frequently in the evenings, she skips meals frequently, she is frequently drinking liquids with calories, and she struggles with emotional eating.  Depression Screen [name]'s Food and Mood (modified PHQ-9) score was 5.   Subjective:   1. Other fatigue [name] admits to daytime somnolence and admits to waking up still tired. Patient has a history of symptoms of daytime fatigue and morning fatigue. [name] generally gets 5 or 7 hours of sleep per night, and states that she has nightime awakenings. Snoring is present. Apneic episodes are not present. Epworth Sleepiness Score is 13.   2. SOB (shortness of breath) on exertion [name] notes increasing shortness of breath with exercising and seems to be worsening over time with weight gain. She notes getting out of breath sooner with activity than she used to. This has not gotten worse recently. [name] denies shortness of breath at rest or orthopnea.  3. Low serum vitamin B12 Reviewed previous labs she had a low normal B12  level she has an increased MCV which may be related to methotrexate.  4. Weight gain due to medication Patient on antiepileptics, steroids and immunomodulators.  5. Abnormal metabolism Patient has a slower than predicted metabolism. IC 1267 vs. calculated 1390.  Likely secondary to medications, chronic disease and low volume of physical activity.  This may contribute to weight gain, chronic fatigue and difficulty losing weight.  6. Anemia, unspecified type She had a hemoglobin of 11.8 with increased MCV which may be secondary to methotrexate.   7. Suspected sleep apnea She has an elevated Epworth of 13 and has symptoms of OSA.   Assessment/Plan:   1. Other fatigue [name] does feel that her weight is causing her energy to be lower than it should be. Fatigue may be related to obesity, depression or many other causes. Labs will be ordered, and in the meanwhile, [name] will focus on self care including making healthy food choices, increasing physical activity and focusing on stress reduction.  2. SOB (shortness of breath) on exertion [name] does feel that she gets out of breath more easily that she used to when she exercises. [name]'s shortness of breath appears to be obesity related and exercise induced. She has agreed to work on weight loss and gradually increase exercise to treat her exercise induced shortness of breath. Will continue to monitor closely.  3. Low serum vitamin B12 We will check homocystine and methylmalonic acid to confirm either folic acid deficiency or B12 deficiency.  4. Weight gain due to medication Labs will be obtained today.   - Homocysteine - Methylmalonic acid, serum  5. Abnormal metabolism We reviewed measures to improve metabolism including not skipping meals, progressive strengthening exercises, increasing protein intake at every meal and maintaining adequate hydration and sleep.   6. Anemia, unspecified type We will check iron profile, methylmalonic acid  acid and homocystine levels.  Not sure if she is taking folic acid along with methotrexate.  We will also review history of endoscopies.  - Homocysteine - Methylmalonic acid, serum - Iron, TIBC and Ferritin Panel  7. Suspected sleep apnea We will do further screening at the next office visit and referred for polysomnography if patient is agreeable.  8. Depression screen [name] had a positive depression screening. Depression is commonly associated with obesity and often results in emotional eating behaviors. We will monitor this closely and work on CBT to help improve the non-hunger eating patterns. Referral to Psychology may be required if no improvement is seen as she continues in our clinic.  9. Class 3 severe obesity with serious comorbidity and body mass index (BMI) of 40.0 to 44.9 in adult, unspecified obesity type (HCC) Contributing factors: Age, postmenopausal status, chronic disease, obesogenic medications, impaired mobility, chronic skipping of meals.  Limited ability to prepare meals due to chronic conditions.  Patient provided with multiple options including reduced calorie nutrition plan targeting 1000 cal/day 80 to 90 g of protein, list of protein shakes that she may use as a meal replacement and prepackaged food services.  Labs will be obtained today.   - Hemoglobin A1c - Insulin, random - VITAMIN D 25 Hydroxy (Vit-D Deficiency, Fractures) - Lipid Panel With LDL/HDL Ratio - Glucose, fasting  [name] is currently in the action stage of change and her goal is to continue with weight loss efforts. I recommend [name] begin the structured treatment plan as follows:  She has agreed to the Category 1 Plan.  Handouts on shakes and prepackaged foods was provided.  Exercise goals: All adults should avoid inactivity. Some physical activity is better than none, and adults who participate in any amount of physical activity gain some health benefits.   Behavioral modification strategies:  increasing lean protein intake, decreasing simple carbohydrates, increasing vegetables, increasing water intake, increasing high fiber foods, no skipping meals, meal planning and cooking strategies, better snacking choices, and planning for success.  She was informed of the importance of frequent follow-up visits to maximize her success with intensive lifestyle modifications for her multiple health conditions. She was informed we would discuss her lab results at her next visit unless there is a critical issue that needs to be addressed sooner. [name] agreed to keep her next visit at the agreed upon time to discuss these results.  Objective:   Blood pressure 136/86, pulse 84, temperature 97.8 F (36.6 C), height 4\' 10"  (1.473 m), weight 202 lb (91.6 kg), SpO2 99%. Body mass index is 42.22 kg/m.  EKG: Normal sinus rhythm, rate 74 BPM.  Indirect Calorimeter completed today shows a VO2 of 184 and a REE of 1267.  Her calculated basal metabolic rate is 9604 thus her basal metabolic rate is worse than expected.  General: Cooperative, alert, well developed, in no acute distress. HEENT: Conjunctivae and lids unremarkable. Cardiovascular: Regular rhythm.  Lungs: Normal work of breathing. Neurologic: No focal deficits.   Lab Results  Component Value Date   CREATININE 0.58 05/03/2023   BUN 7 (L) 05/03/2023   NA 138 05/03/2023   K 4.0 05/03/2023   CL 102 05/03/2023   CO2 29 05/03/2023   Lab Results  Component Value Date  ALT 11 05/03/2023   AST 15 05/03/2023   ALKPHOS 53 05/03/2023   BILITOT 0.6 05/03/2023   Lab Results  Component Value Date   HGBA1C 5.9 (H) 05/22/2023   HGBA1C 5.4 06/26/2022   HGBA1C 5.6 09/01/2020   Lab Results  Component Value Date   INSULIN 5.0 05/22/2023   Lab Results  Component Value Date   TSH 0.918 11/09/2022   Lab Results  Component Value Date   CHOL 216 (H) 05/22/2023   HDL 77 05/22/2023   LDLCALC 124 (H) 05/22/2023   TRIG 83 05/22/2023   CHOLHDL  3 06/26/2022   Lab Results  Component Value Date   WBC 3.5 (L) 05/03/2023   HGB 11.8 (L) 05/03/2023   HCT 38.2 05/03/2023   MCV 101.6 (H) 05/03/2023   PLT 226 05/03/2023   Lab Results  Component Value Date   IRON 58 05/22/2023   TIBC 370 05/22/2023   FERRITIN 57 05/22/2023   Attestation Statements:   Reviewed by clinician on day of visit: allergies, medications, problem list, medical history, surgical history, family history, social history, and previous encounter notes.  I have spent 60 minutes in the care of the patient today including: preparing to see patient (e.g. review and interpretation of tests, old notes ), obtaining and/or reviewing separately obtained history, performing a medically appropriate examination or evaluation, counseling and educating the patient, documenting clinical information in the electronic or other health care record, and independently interpreting results and communicating results to the patient, family, or caregiver   I, Burt Knack, am acting as transcriptionist for Worthy Rancher, MD.  I have reviewed the above documentation for accuracy and completeness, and I agree with the above. -Worthy Rancher, MD

## 2023-05-25 LAB — LIPID PANEL WITH LDL/HDL RATIO
Cholesterol, Total: 216 mg/dL — ABNORMAL HIGH (ref 100–199)
HDL: 77 mg/dL (ref >39)
LDL Chol Calc (NIH): 124 mg/dL — ABNORMAL HIGH (ref 0–99)
LDL/HDL Ratio: 1.6 ratio (ref 0.0–3.2)
Triglycerides: 83 mg/dL (ref 0–149)
VLDL Cholesterol Cal: 15 mg/dL (ref 5–40)

## 2023-05-25 LAB — HOMOCYSTEINE: Homocysteine: 7.9 umol/L (ref 0.0–19.2)

## 2023-05-25 LAB — IRON,TIBC AND FERRITIN PANEL
Ferritin: 57 ng/mL (ref 15–150)
Iron Saturation: 16 % (ref 15–55)
Iron: 58 ug/dL (ref 27–139)
Total Iron Binding Capacity: 370 ug/dL (ref 250–450)
UIBC: 312 ug/dL (ref 118–369)

## 2023-05-25 LAB — VITAMIN D 25 HYDROXY (VIT D DEFICIENCY, FRACTURES): Vit D, 25-Hydroxy: 35.3 ng/mL (ref 30.0–100.0)

## 2023-05-25 LAB — METHYLMALONIC ACID, SERUM: Methylmalonic Acid: 121 nmol/L (ref 0–378)

## 2023-05-25 LAB — HEMOGLOBIN A1C
Est. average glucose Bld gHb Est-mCnc: 123 mg/dL
Hgb A1c MFr Bld: 5.9 % — ABNORMAL HIGH (ref 4.8–5.6)

## 2023-05-25 LAB — GLUCOSE, FASTING: Glucose, Plasma: 93 mg/dL (ref 70–99)

## 2023-05-25 LAB — INSULIN, RANDOM: INSULIN: 5 u[IU]/mL (ref 2.6–24.9)

## 2023-05-30 DIAGNOSIS — M792 Neuralgia and neuritis, unspecified: Secondary | ICD-10-CM | POA: Diagnosis not present

## 2023-05-30 DIAGNOSIS — R600 Localized edema: Secondary | ICD-10-CM | POA: Diagnosis not present

## 2023-05-30 DIAGNOSIS — B351 Tinea unguium: Secondary | ICD-10-CM | POA: Diagnosis not present

## 2023-05-30 DIAGNOSIS — M19071 Primary osteoarthritis, right ankle and foot: Secondary | ICD-10-CM | POA: Diagnosis not present

## 2023-05-30 DIAGNOSIS — M19072 Primary osteoarthritis, left ankle and foot: Secondary | ICD-10-CM | POA: Diagnosis not present

## 2023-05-30 DIAGNOSIS — I739 Peripheral vascular disease, unspecified: Secondary | ICD-10-CM | POA: Diagnosis not present

## 2023-06-05 ENCOUNTER — Ambulatory Visit (INDEPENDENT_AMBULATORY_CARE_PROVIDER_SITE_OTHER): Payer: Medicare PPO | Admitting: Internal Medicine

## 2023-06-05 ENCOUNTER — Encounter (INDEPENDENT_AMBULATORY_CARE_PROVIDER_SITE_OTHER): Payer: Self-pay

## 2023-06-07 ENCOUNTER — Other Ambulatory Visit: Payer: Self-pay

## 2023-06-07 ENCOUNTER — Ambulatory Visit (INDEPENDENT_AMBULATORY_CARE_PROVIDER_SITE_OTHER): Payer: Self-pay | Admitting: Surgical

## 2023-06-07 DIAGNOSIS — M19022 Primary osteoarthritis, left elbow: Secondary | ICD-10-CM

## 2023-06-07 DIAGNOSIS — M542 Cervicalgia: Secondary | ICD-10-CM | POA: Diagnosis not present

## 2023-06-08 ENCOUNTER — Encounter: Payer: Self-pay | Admitting: Orthopedic Surgery

## 2023-06-08 MED ORDER — METHYLPREDNISOLONE ACETATE 40 MG/ML IJ SUSP
20.0000 mg | INTRAMUSCULAR | Status: AC | PRN
Start: 2023-06-07 — End: 2023-06-07
  Administered 2023-06-07: 20 mg via INTRA_ARTICULAR

## 2023-06-08 MED ORDER — LIDOCAINE HCL 1 % IJ SOLN
5.0000 mL | INTRAMUSCULAR | Status: AC | PRN
Start: 2023-06-07 — End: 2023-06-07
  Administered 2023-06-07: 5 mL

## 2023-06-08 MED ORDER — BUPIVACAINE HCL 0.25 % IJ SOLN
2.5000 mL | INTRAMUSCULAR | Status: AC | PRN
Start: 2023-06-07 — End: 2023-06-07
  Administered 2023-06-07: 2.5 mL via INTRA_ARTICULAR

## 2023-06-08 NOTE — Progress Notes (Signed)
Office Visit Note   Patient: [name]           Date of Birth: 1951-07-31           MRN: [id number] Visit Date: 06/07/2023 Requested by: Deeann Saint, MD [address] [place],  [place] [address] PCP: Deeann Saint, MD  Subjective: Chief Complaint  Patient presents with   Left Elbow - Pain   Neck - Pain    HPI: [name] is a 72 y.o. female who presents to the office reporting left elbow pain and left-sided neck pain.  Patient states that her left elbow has been bothering her more over the last 3 weeks without any history of injury.  She localizes pain to the posterior aspect of the elbow.  No weakness in the arm.  No mechanical symptoms or locking.  No history of prior surgery to the elbow.  Does have history of rheumatoid arthritis.  She is having to take Tylenol and Aleve 2-3 times a day to deal with the pain.  She lives by herself.  She has previously been seen for left elbow pain with prior left elbow injection on 12/22/2022 that gave her good relief of her symptoms while she was here in the clinic.  She also complains of left-sided neck pain without any radiculopathy.  Primarily bothers her with neck range of motion and when she looks up or down.  No headaches or vision changes that are new.  She has previous MRI of the cervical spine from January 2024 at that time demonstrating multilevel degenerative changes of the cervical spine with marrow edema of the facet joints at C2-C3 and C4-C5.  No numbness or tingling in her hands.              ROS: All systems reviewed are negative as they relate to the chief complaint within the history of present illness.  Patient denies fevers or chills.  Assessment & Plan: Visit Diagnoses:  1. Arthritis of left elbow   2. Neck pain     Plan: Patient is a 72 year old female who presents for evaluation of left elbow pain and neck pain without radiculopathy.  She has a lot of stiffness in her neck with axial cervical spine  pain that correlates with her significant facet arthritis noted on MRI scan from January 2024.  May have some contribution of pain from some disc bulging as well.  Plan to try cervical spine ESI with Dr. Alvester Morin and see if he has any other options for helping Phalyn with her neck pain.  Also plan to try home health physical therapy for cervical spine exercises and gait training/balance.  Regarding her left elbow pain.  She has history of left elbow arthritis that has responded well to cortisone injection that was last administered in March 2024.  She would like to repeat this today.  This was administered under ultrasound guidance and she tolerated procedure without difficulty.  Left elbow injection successfully administered into the posterior recess of the elbow with excellent flow of injection and no resistance.  She did have symptomatic relief up to 80% during the anesthetic portion of the injection.  Follow-up with the office as needed if symptoms do not improve.  Follow-Up Instructions: No follow-ups on file.   Orders:  Orders Placed This Encounter  Procedures   US Guided Needle Placement - No Linked Charges   Ambulatory referral to Physical Medicine Rehab   No orders of the defined types were  placed in this encounter.     Procedures: Medium Joint Inj: L elbow on 06/07/2023 9:24 AM Indications: pain and joint swelling Details: 18 G 1.5 in needle, ultrasound-guided Medications: 5 mL lidocaine 1 %; 2.5 mL bupivacaine 0.25 %; 20 mg methylPREDNISolone acetate 40 MG/ML Outcome: tolerated well, no immediate complications Procedure, treatment alternatives, risks and benefits explained, specific risks discussed. Consent was given by the patient. Immediately prior to procedure a time out was called to verify the correct patient, procedure, equipment, support staff and site/side marked as required. Patient was prepped and draped in the usual sterile fashion.       No additional  findings.  Objective: Vital Signs: There were no vitals taken for this visit.  Physical Exam:  Constitutional: Patient appears well-developed HEENT:  Head: Normocephalic Eyes:EOM are normal Neck: Normal range of motion Cardiovascular: Normal rate Pulmonary/chest: Effort normal Neurologic: Patient is alert Skin: Skin is warm Psychiatric: Patient has normal mood and affect  Ortho Exam: Ortho exam demonstrates left elbow with intact elbow flexion and extension actively.  She has full elbow flexion but lacks about 5 degrees of extension.  She does have crepitus noted with passive motion of the elbow primarily with flexion extension of passively consistent with osteoarthritis.  No pain with passive pronation or supination.  No cellulitis or skin changes noted overlying the left elbow.  Intact EPL, FPL, finger abduction, grip strength testing, pronation/supination, bicep, tricep, deltoid bilaterally.  She has negative Spurling sign.  Negative Lhermitte sign but she is extremely stiff when attempting to perform Lhermitte sign and she only has a couple degrees of range of motion in regards to her leaning her neck and the head toward her shoulders.  Does have increased pain with cervical spine range of motion with stiffness with rotation to either side and with flexion/extension.  Specialty Comments:  EMG/NCS 03/31/21 Impression: The above electrodiagnostic study is ABNORMAL and reveals evidence of:   1. a severe right median nerve entrapment at the wrist (carpal tunnel syndrome) affecting sensory and motor components.    2. a mild left median nerve entrapment at the wrist (carpal tunnel syndrome) affecting sensory components.   There is no significant electrodiagnostic evidence of any other focal nerve entrapment, brachial plexopathy or cervical radiculopathy.    Recommendations: 1.  Follow-up with referring physician. 2.  Continue current management of symptoms. 3.  Suggest surgical  evaluation.   ___________________________ Elease Hashimoto Board Certified, American Board of Physical Medicine and Rehabilitation  Imaging: No results found.   PMFS History: Patient Active Problem List   Diagnosis Date Noted   Abnormal metabolism 05/22/2023   Weight gain due to medication 05/22/2023   Low serum vitamin B12 05/22/2023   Class 3 severe obesity with serious comorbidity and body mass index (BMI) of 40.0 to 44.9 in adult [facility]) 05/22/2023   Anemia 05/22/2023   Depression screen 05/22/2023   Other fatigue 05/22/2023   Suspected sleep apnea 05/22/2023   Rheumatoid arthritis (HCC) 11/29/2022   Infection due to ESBL-producing Escherichia coli 11/09/2022   Acute pyelonephritis 11/09/2022   Bacteremia 11/09/2022   Sepsis (HCC) 11/09/2022   SOB (shortness of breath) on exertion 11/08/2022   DJD (degenerative joint disease) of knee 04/13/2022   S/P TKR (total knee replacement), left 04/13/2022   Arthritis of right knee    S/P total knee arthroplasty, right 12/08/2021   Pressure injury of skin 08/27/2021   Fall 08/23/2021   Difficulty in walking 08/23/2021   Bilateral lower extremity edema  08/23/2021   Hormone replacement therapy 08/23/2021   Osteoporosis 01/28/2021   Osteoarthritis of right wrist 01/28/2021   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   Foot pain 09/13/2020   Knee pain 08/24/2018   Fall in home 07/14/2015   TIBIALIS TENDINITIS 08/25/2009   CHEST PAIN, OTHER, PAIN 10/21/2008   PEDAL EDEMA 07/22/2008   ANKLE PAIN, BILATERAL 04/01/2008   LOW BACK PAIN 04/01/2008   ACUTE BRONCHITIS 09/23/2007   SARCOIDOSIS 09/02/2007   Asthma 09/02/2007   Diverticulosis of colon 07/11/2007   Backache 07/11/2007   Past Medical History:  Diagnosis Date   Acute bronchitis    Allergy    Anemia    Anxiety    Arthritis    knees   Asthma    Back pain    Backache, unspecified    Cataract    removed both eyes   Depression    Diverticulitis    Diverticulosis of colon  (without mention of hemorrhage)    Edema    GERD (gastroesophageal reflux disease)    Glaucoma    Joint pain    Lactose intolerance    Lumbago    Neuropathy    Obesity    Osteoarthritis    Other chest pain    Pain in joint, ankle and foot    Rheumatoid arthritis (HCC)    Sarcoidosis    SOB (shortness of breath)    Tibialis tendinitis    Unspecified asthma(493.90)     Family History  Problem Relation Age of Onset   Hypertension Mother    Stroke Mother    Depression Mother    Anxiety disorder Mother    Obesity Mother    Diabetes Father    Sarcoidosis Sister    Colon cancer Neg Hx    Colon polyps Neg Hx    Esophageal cancer Neg Hx    Rectal cancer Neg Hx    Stomach cancer Neg Hx     Past Surgical History:  Procedure Laterality Date   ABDOMINAL HYSTERECTOMY     CATARACT EXTRACTION W/PHACO Right 06/24/2014   Procedure: CATARACT EXTRACTION PHACO AND INTRAOCULAR LENS PLACEMENT (IOC) RIGHT EYE WITH GONIOSYNECHIALYSIS;  Surgeon: Chalmers Guest, MD;  Location: [facility] OR;  Service: Ophthalmology;  Laterality: Right;   COLONOSCOPY     EYE SURGERY Left    cataract surgery   HAND SURGERY Right    trigger finger release   IR RADIOLOGIST EVAL & MGMT  11/14/2018   KNEE ARTHROSCOPY Right    TOTAL KNEE ARTHROPLASTY Right 12/08/2021   Procedure: RIGHT TOTAL KNEE ARTHROPLASTY APPLICATION OF WOUND VAC;  Surgeon: Cammy Copa, MD;  Location: [facility] OR;  Service: Orthopedics;  Laterality: Right;   TOTAL KNEE ARTHROPLASTY Left 04/13/2022   Procedure: LEFT TOTAL KNEE ARTHROPLASTY;  Surgeon: Cammy Copa, MD;  Location: [facility] OR;  Service: Orthopedics;  Laterality: Left;   Social History   Occupational History   Occupation: Pensions consultant (semi-retired)  Tobacco Use   Smoking status: Never   Smokeless tobacco: Never  Vaping Use   Vaping status: Never Used  Substance and Sexual Activity   Alcohol use: Yes    Comment: 1 glass of wine or mixed drink once a week   Drug use: No   Sexual  activity: Not on file

## 2023-06-11 ENCOUNTER — Encounter: Payer: Self-pay | Admitting: Physical Medicine and Rehabilitation

## 2023-06-11 ENCOUNTER — Ambulatory Visit: Payer: Medicare PPO | Admitting: Physical Medicine and Rehabilitation

## 2023-06-11 DIAGNOSIS — M542 Cervicalgia: Secondary | ICD-10-CM

## 2023-06-11 DIAGNOSIS — M47812 Spondylosis without myelopathy or radiculopathy, cervical region: Secondary | ICD-10-CM | POA: Diagnosis not present

## 2023-06-11 DIAGNOSIS — M7918 Myalgia, other site: Secondary | ICD-10-CM

## 2023-06-11 MED ORDER — MELOXICAM 15 MG PO TABS: 15.0000 mg | ORAL_TABLET | Freq: Every day | ORAL | 0 refills | Status: DC

## 2023-06-11 NOTE — Progress Notes (Unsigned)
[name] - 72 y.o. female MRN [id number]  Date of birth: Sep 22, 1951  Office Visit Note: Visit Date: 06/11/2023 PCP: Deeann Saint, MD Referred by: Deeann Saint, MD  Subjective: Chief Complaint  Patient presents with   Neck - Pain   HPI: [name] is a 72 y.o. female who comes in today per the request of Karenann Cai, [place] for evaluation of chronic, worsening and severe bilateral neck pain radiating up to her head/ears, left greater than right. Pain ongoing for several years, worsened over the last couple of weeks. Reports severe pain with movement, specifically side to side movement to the left. She describes pain as sore, aching and pressure sensation, currently rates as 6 out of 10. Some relief of pain with home exercise regimen, rest and use of medications.  Some relief of pain with Tylenol and Aleve. No history of formal physical therapy for cervical spine. Cervical MRI imaging from January shows periarticular marrow edema at the left C2-C3 and right C4-C5 facet joints related to arthritis. No high grade spinal canal stenosis. No history of cervical surgery/injections. Patient denies focal weakness, numbness and tingling. No recent trauma or falls. Patient currently using wheelchair to assist with ambulation.    Review of Systems  Musculoskeletal:  Positive for myalgias and neck pain.  Neurological:  Negative for tingling, sensory change, focal weakness and weakness.  All other systems reviewed and are negative.  Otherwise per HPI.  Assessment & Plan: Visit Diagnoses:    ICD-10-CM   1. Cervicalgia  M54.2 Ambulatory referral to Physical Medicine Rehab    2. Facet arthropathy, cervical  M47.812 Ambulatory referral to Physical Medicine Rehab    3. Myofascial pain syndrome  M79.18 Ambulatory referral to Physical Medicine Rehab       Plan: Findings:  Chronic, worsening and severe bilateral neck pain radiating up to her head/ears, left greater than right. Patient  continues to have severe pain despite good conservative therapies such as home exercise regimen, rest and use of medications. Patients clinical presentation and exam are consistent with facet mediated pain. There is periarticular marrow edema at the left C2-C3 which can refer up to the head. Next step is to perform diagnostic and hopefully therapeutic bilateral C2-C3 facet joint injection under fluoroscopic guidance. If good relief of pain we can repeat this procedure infrequently as needed. I explained injection procedure to patient in detail today, she has no questions at this time. I also feel there is a myofascial component contributing to her pain. I also placed order for home based physical therapy as she does have tenderness to bilateral levator scapulae and trapezius regions. Patient inquired about pain medication, I did prescribe short course of Meloxicam for her. We will see her back for cervical facet injections. No red flag symptoms noted upon exam today.     Meds & Orders:  Meds ordered this encounter  Medications   meloxicam (MOBIC) 15 MG tablet    Sig: Take 1 tablet (15 mg total) by mouth daily.    Dispense:  30 tablet    Refill:  0    Orders Placed This Encounter  Procedures   Ambulatory referral to Physical Medicine Rehab    Follow-up: Return for Bilateral C2-C3 facet joint injections.   Procedures: No procedures performed      Clinical History: CLINICAL DATA:  Eval for possible myelopathy   EXAM: MRI CERVICAL SPINE WITHOUT CONTRAST   TECHNIQUE: Multiplanar, multisequence MR imaging of the cervical spine was  performed. No intravenous contrast was administered.   COMPARISON:  Cervical spine radiograph 09/06/2022.   FINDINGS: Alignment: Trace degenerative anterolisthesis at C4-C5 and C6-C7.   Vertebrae: No evidence of acute fracture, discitis, or aggressive osseous lesion. Periarticular marrow edema at the left C2-C3 facet and at the right C4-C5 facet.   Cord:  No abnormal cord signal.  Normal cord morphology.   Posterior Fossa, vertebral arteries, paraspinal tissues: Negative.   Disc levels: The craniocervical junction is unremarkable.   C2-C3: Minimal disc bulging and uncovertebral joint hypertrophy with bilateral facet arthropathy. Minimal spinal canal. No significant neural foraminal stenosis.   C3-C4: Minimal disc bulging and uncovertebral joint hypertrophy with bilateral facet arthropathy. Minimal spinal canal narrowing. No significant neural foraminal stenosis.   C4-C5: Trace degenerative anterolisthesis. Uncovertebral joint hypertrophy with bilateral facet arthropathy. No significant disc bulging. There is moderate right and no significant left neural foraminal stenosis.   C5-C6: Left greater than right uncovertebral joint hypertrophy with bilateral facet arthropathy. No significant disc bulge. Minimal spinal canal stenosis. There is mild-to-moderate left and no signal right neural foraminal narrowing.   C6-C7: Minimal disc bulging with uncovertebral joint hypertrophy and bowel facet arthropathy. No significant spinal canal or neural foraminal stenosis.   C7-T1: Bilateral facet arthropathy. No significant disc bulge. No significant spinal canal or neural foraminal stenosis.   IMPRESSION: Multilevel degenerative changes of the cervical spine, worst at C4-C5, where there is moderate right-sided neural foraminal stenosis.   Mild-to-moderate left-sided neural foraminal stenosis at C5-C6.   Minimal spinal canal narrowing at C2-C3, C3-C4, and C5-C6. No abnormal cord signal.   Periarticular marrow edema at the left C2-C3 and right C4-C5 facet joints related to arthritis.     Electronically Signed   By: Caprice Renshaw M.D.   On: 10/08/2022 09:37   She reports that she has never smoked. She has never used smokeless tobacco.  Recent Labs    06/26/22 1124 05/22/23 0859  HGBA1C 5.4 5.9*    Objective:  VS:  HT:    WT:    BMI:     BP:   HR: bpm  TEMP: ( )  RESP:  Physical Exam Vitals and nursing note reviewed.  HENT:     Head: Normocephalic and atraumatic.     Right Ear: External ear normal.     Left Ear: External ear normal.     Nose: Nose normal.     Mouth/Throat:     Mouth: Mucous membranes are moist.  Eyes:     Extraocular Movements: Extraocular movements intact.  Cardiovascular:     Rate and Rhythm: Normal rate.     Pulses: Normal pulses.  Pulmonary:     Effort: Pulmonary effort is normal.  Abdominal:     General: Abdomen is flat. There is no distension.  Musculoskeletal:        General: Tenderness present.     Cervical back: Tenderness present.     Comments: Discomfort noted with side-to-side rotation. Patient has good strength in the upper extremities including 5 out of 5 strength in wrist extension, long finger flexion and APB. Shoulder range of motion is full bilaterally without any sign of impingement. There is no atrophy of the hands intrinsically. Sensation intact bilaterally. Tenderness noted to bilateral levator scapulae and trapezius regions. Negative Hoffman's sign. Negative Spurling's sign.     Skin:    General: Skin is warm and dry.     Capillary Refill: Capillary refill takes less than 2 seconds.  Neurological:  General: No focal deficit present.     Mental Status: She is alert and oriented to person, place, and time.  Psychiatric:        Mood and Affect: Mood normal.        Behavior: Behavior normal.     Ortho Exam  Imaging: No results found.  Past Medical/Family/Surgical/Social History: Medications & Allergies reviewed per EMR, new medications updated. Patient Active Problem List   Diagnosis Date Noted   Abnormal metabolism 05/22/2023   Weight gain due to medication 05/22/2023   Low serum vitamin B12 05/22/2023   Class 3 severe obesity with serious comorbidity and body mass index (BMI) of 40.0 to 44.9 in adult [facility]) 05/22/2023   Anemia 05/22/2023    Depression screen 05/22/2023   Other fatigue 05/22/2023   Suspected sleep apnea 05/22/2023   Rheumatoid arthritis (HCC) 11/29/2022   Infection due to ESBL-producing Escherichia coli 11/09/2022   Acute pyelonephritis 11/09/2022   Bacteremia 11/09/2022   Sepsis (HCC) 11/09/2022   SOB (shortness of breath) on exertion 11/08/2022   DJD (degenerative joint disease) of knee 04/13/2022   S/P TKR (total knee replacement), left 04/13/2022   Arthritis of right knee    S/P total knee arthroplasty, right 12/08/2021   Pressure injury of skin 08/27/2021   Fall 08/23/2021   Difficulty in walking 08/23/2021   Bilateral lower extremity edema 08/23/2021   Hormone replacement therapy 08/23/2021   Osteoporosis 01/28/2021   Osteoarthritis of right wrist 01/28/2021   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   Foot pain 09/13/2020   Knee pain 08/24/2018   Fall in home 07/14/2015   TIBIALIS TENDINITIS 08/25/2009   CHEST PAIN, OTHER, PAIN 10/21/2008   PEDAL EDEMA 07/22/2008   ANKLE PAIN, BILATERAL 04/01/2008   LOW BACK PAIN 04/01/2008   ACUTE BRONCHITIS 09/23/2007   SARCOIDOSIS 09/02/2007   Asthma 09/02/2007   Diverticulosis of colon 07/11/2007   Backache 07/11/2007   Past Medical History:  Diagnosis Date   Acute bronchitis    Allergy    Anemia    Anxiety    Arthritis    knees   Asthma    Back pain    Backache, unspecified    Cataract    removed both eyes   Depression    Diverticulitis    Diverticulosis of colon (without mention of hemorrhage)    Edema    GERD (gastroesophageal reflux disease)    Glaucoma    Joint pain    Lactose intolerance    Lumbago    Neuropathy    Obesity    Osteoarthritis    Other chest pain    Pain in joint, ankle and foot    Rheumatoid arthritis (HCC)    Sarcoidosis    SOB (shortness of breath)    Tibialis tendinitis    Unspecified asthma(493.90)    Family History  Problem Relation Age of Onset   Hypertension Mother    Stroke Mother    Depression Mother     Anxiety disorder Mother    Obesity Mother    Diabetes Father    Sarcoidosis Sister    Colon cancer Neg Hx    Colon polyps Neg Hx    Esophageal cancer Neg Hx    Rectal cancer Neg Hx    Stomach cancer Neg Hx    Past Surgical History:  Procedure Laterality Date   ABDOMINAL HYSTERECTOMY     CATARACT EXTRACTION W/PHACO Right 06/24/2014   Procedure: CATARACT EXTRACTION PHACO AND INTRAOCULAR LENS PLACEMENT (IOC) RIGHT EYE  WITH GONIOSYNECHIALYSIS;  Surgeon: Chalmers Guest, MD;  Location: [facility] OR;  Service: Ophthalmology;  Laterality: Right;   COLONOSCOPY     EYE SURGERY Left    cataract surgery   HAND SURGERY Right    trigger finger release   IR RADIOLOGIST EVAL & MGMT  11/14/2018   KNEE ARTHROSCOPY Right    TOTAL KNEE ARTHROPLASTY Right 12/08/2021   Procedure: RIGHT TOTAL KNEE ARTHROPLASTY APPLICATION OF WOUND VAC;  Surgeon: Cammy Copa, MD;  Location: [facility] OR;  Service: Orthopedics;  Laterality: Right;   TOTAL KNEE ARTHROPLASTY Left 04/13/2022   Procedure: LEFT TOTAL KNEE ARTHROPLASTY;  Surgeon: Cammy Copa, MD;  Location: [facility] OR;  Service: Orthopedics;  Laterality: Left;   Social History   Occupational History   Occupation: Pensions consultant (semi-retired)  Tobacco Use   Smoking status: Never   Smokeless tobacco: Never  Vaping Use   Vaping status: Never Used  Substance and Sexual Activity   Alcohol use: Yes    Comment: 1 glass of wine or mixed drink once a week   Drug use: No   Sexual activity: Not on file

## 2023-06-11 NOTE — Progress Notes (Unsigned)
Functional Pain Scale - descriptive words and definitions  Distressing (6)    Pain is present/unable to complete most ADLs limited by pain/sleep is difficult and active distraction is only marginal. Moderate range order  Average Pain 7  Neck pain on both sides that radiates into the head to the top of the ears. Pain medicine and stretching makes pain better

## 2023-06-12 ENCOUNTER — Telehealth: Payer: Self-pay | Admitting: Family Medicine

## 2023-06-12 DIAGNOSIS — M542 Cervicalgia: Secondary | ICD-10-CM | POA: Diagnosis not present

## 2023-06-12 DIAGNOSIS — J45909 Unspecified asthma, uncomplicated: Secondary | ICD-10-CM | POA: Diagnosis not present

## 2023-06-12 DIAGNOSIS — Z6841 Body Mass Index (BMI) 40.0 and over, adult: Secondary | ICD-10-CM | POA: Diagnosis not present

## 2023-06-12 DIAGNOSIS — M069 Rheumatoid arthritis, unspecified: Secondary | ICD-10-CM | POA: Diagnosis not present

## 2023-06-12 DIAGNOSIS — Z7982 Long term (current) use of aspirin: Secondary | ICD-10-CM | POA: Diagnosis not present

## 2023-06-12 DIAGNOSIS — M19022 Primary osteoarthritis, left elbow: Secondary | ICD-10-CM | POA: Diagnosis not present

## 2023-06-12 DIAGNOSIS — M810 Age-related osteoporosis without current pathological fracture: Secondary | ICD-10-CM | POA: Diagnosis not present

## 2023-06-12 DIAGNOSIS — D649 Anemia, unspecified: Secondary | ICD-10-CM | POA: Diagnosis not present

## 2023-06-12 NOTE — Telephone Encounter (Signed)
[name], PT with [facility] [phone]   *FYI*  High BP report:  212/110   (Did not take Amlodipine) 172/104   (1/2 hour after taking Amlodipine)  Possible Medication Interaction: KEVZARA 200 MG/1. SOAJ  &  leflunomide (ARAVA) 20 MG tablet   Pt Taking Several Inflammatories  aspirin EC 325 MG tablet and Aleve (220) ... And, will start Meloxicam next week, as per Ortho Doc  *Please feel free to F/U with Pt.*

## 2023-06-13 ENCOUNTER — Other Ambulatory Visit: Payer: Self-pay | Admitting: Family Medicine

## 2023-06-13 ENCOUNTER — Telehealth: Payer: Self-pay | Admitting: Physical Medicine and Rehabilitation

## 2023-06-13 DIAGNOSIS — E559 Vitamin D deficiency, unspecified: Secondary | ICD-10-CM

## 2023-06-13 NOTE — Telephone Encounter (Signed)
Vinnie Langton called from [facility] has a verbal order twice a week for 4 weeks, one a week for 2 weeks, OT Eval. [id number].

## 2023-06-15 ENCOUNTER — Telehealth: Payer: Self-pay | Admitting: Physical Medicine and Rehabilitation

## 2023-06-15 DIAGNOSIS — M069 Rheumatoid arthritis, unspecified: Secondary | ICD-10-CM | POA: Diagnosis not present

## 2023-06-15 DIAGNOSIS — Z7982 Long term (current) use of aspirin: Secondary | ICD-10-CM | POA: Diagnosis not present

## 2023-06-15 DIAGNOSIS — M542 Cervicalgia: Secondary | ICD-10-CM | POA: Diagnosis not present

## 2023-06-15 DIAGNOSIS — D649 Anemia, unspecified: Secondary | ICD-10-CM | POA: Diagnosis not present

## 2023-06-15 DIAGNOSIS — M19022 Primary osteoarthritis, left elbow: Secondary | ICD-10-CM | POA: Diagnosis not present

## 2023-06-15 DIAGNOSIS — M810 Age-related osteoporosis without current pathological fracture: Secondary | ICD-10-CM | POA: Diagnosis not present

## 2023-06-15 DIAGNOSIS — Z6841 Body Mass Index (BMI) 40.0 and over, adult: Secondary | ICD-10-CM | POA: Diagnosis not present

## 2023-06-15 DIAGNOSIS — J45909 Unspecified asthma, uncomplicated: Secondary | ICD-10-CM | POA: Diagnosis not present

## 2023-06-15 NOTE — Telephone Encounter (Signed)
Would make sure pt is taking meds consistently each day.  Decrease sodium intake and increase water intake. Would advise HH to discuss potential interactions with the prescribing providers.  Can schedule f/u appt for continued bp elevation.  For symptoms related to elevated bp proceed to nearest ED.

## 2023-06-15 NOTE — Telephone Encounter (Signed)
Will from [facility] called to get verbal orders for OT 2 week 2 and 1 week 1 CB [phone] Secure to LVM

## 2023-06-18 ENCOUNTER — Other Ambulatory Visit: Payer: Self-pay

## 2023-06-18 MED ORDER — AMLODIPINE BESYLATE 5 MG PO TABS: 5.0000 mg | ORAL_TABLET | Freq: Every day | ORAL | 1 refills | Status: DC

## 2023-06-18 NOTE — Telephone Encounter (Signed)
Called Patient was unable to leave a VM

## 2023-06-18 NOTE — Telephone Encounter (Signed)
Spoke with patient and she is aware, patient is wanting medication fill that wasn't prescribed by Dr. Salomon Fick, I informed patient that Dr. Salomon Fick is out of town and that is would be best if she ask the Doctor that prescribe the medication to refill them until she comes back.

## 2023-06-19 ENCOUNTER — Telehealth: Payer: Self-pay | Admitting: Physical Medicine and Rehabilitation

## 2023-06-19 ENCOUNTER — Other Ambulatory Visit: Payer: Self-pay | Admitting: Family Medicine

## 2023-06-19 DIAGNOSIS — M069 Rheumatoid arthritis, unspecified: Secondary | ICD-10-CM | POA: Diagnosis not present

## 2023-06-19 DIAGNOSIS — M542 Cervicalgia: Secondary | ICD-10-CM | POA: Diagnosis not present

## 2023-06-19 DIAGNOSIS — M19022 Primary osteoarthritis, left elbow: Secondary | ICD-10-CM | POA: Diagnosis not present

## 2023-06-19 DIAGNOSIS — M810 Age-related osteoporosis without current pathological fracture: Secondary | ICD-10-CM | POA: Diagnosis not present

## 2023-06-19 DIAGNOSIS — D649 Anemia, unspecified: Secondary | ICD-10-CM | POA: Diagnosis not present

## 2023-06-19 DIAGNOSIS — Z7982 Long term (current) use of aspirin: Secondary | ICD-10-CM | POA: Diagnosis not present

## 2023-06-19 DIAGNOSIS — J3089 Other allergic rhinitis: Secondary | ICD-10-CM

## 2023-06-19 DIAGNOSIS — J45909 Unspecified asthma, uncomplicated: Secondary | ICD-10-CM | POA: Diagnosis not present

## 2023-06-19 DIAGNOSIS — J4541 Moderate persistent asthma with (acute) exacerbation: Secondary | ICD-10-CM

## 2023-06-19 DIAGNOSIS — Z6841 Body Mass Index (BMI) 40.0 and over, adult: Secondary | ICD-10-CM | POA: Diagnosis not present

## 2023-06-19 NOTE — Telephone Encounter (Signed)
Patient called and said that her pain in her neck and feet is a 7 out of 10. She also is out of her Gabapentin. Her doctor is on vacation and needs it. [phone]

## 2023-06-20 DIAGNOSIS — J45909 Unspecified asthma, uncomplicated: Secondary | ICD-10-CM | POA: Diagnosis not present

## 2023-06-20 DIAGNOSIS — D649 Anemia, unspecified: Secondary | ICD-10-CM | POA: Diagnosis not present

## 2023-06-20 DIAGNOSIS — M069 Rheumatoid arthritis, unspecified: Secondary | ICD-10-CM | POA: Diagnosis not present

## 2023-06-20 DIAGNOSIS — M810 Age-related osteoporosis without current pathological fracture: Secondary | ICD-10-CM | POA: Diagnosis not present

## 2023-06-20 DIAGNOSIS — Z6841 Body Mass Index (BMI) 40.0 and over, adult: Secondary | ICD-10-CM | POA: Diagnosis not present

## 2023-06-20 DIAGNOSIS — M19022 Primary osteoarthritis, left elbow: Secondary | ICD-10-CM | POA: Diagnosis not present

## 2023-06-20 DIAGNOSIS — M542 Cervicalgia: Secondary | ICD-10-CM | POA: Diagnosis not present

## 2023-06-20 DIAGNOSIS — Z7982 Long term (current) use of aspirin: Secondary | ICD-10-CM | POA: Diagnosis not present

## 2023-06-21 DIAGNOSIS — M19022 Primary osteoarthritis, left elbow: Secondary | ICD-10-CM | POA: Diagnosis not present

## 2023-06-21 DIAGNOSIS — M542 Cervicalgia: Secondary | ICD-10-CM | POA: Diagnosis not present

## 2023-06-21 DIAGNOSIS — J45909 Unspecified asthma, uncomplicated: Secondary | ICD-10-CM | POA: Diagnosis not present

## 2023-06-21 DIAGNOSIS — M069 Rheumatoid arthritis, unspecified: Secondary | ICD-10-CM | POA: Diagnosis not present

## 2023-06-21 DIAGNOSIS — M810 Age-related osteoporosis without current pathological fracture: Secondary | ICD-10-CM | POA: Diagnosis not present

## 2023-06-21 DIAGNOSIS — D649 Anemia, unspecified: Secondary | ICD-10-CM | POA: Diagnosis not present

## 2023-06-21 DIAGNOSIS — Z7982 Long term (current) use of aspirin: Secondary | ICD-10-CM | POA: Diagnosis not present

## 2023-06-21 DIAGNOSIS — Z6841 Body Mass Index (BMI) 40.0 and over, adult: Secondary | ICD-10-CM | POA: Diagnosis not present

## 2023-06-22 ENCOUNTER — Encounter (HOSPITAL_BASED_OUTPATIENT_CLINIC_OR_DEPARTMENT_OTHER): Payer: Self-pay

## 2023-06-22 DIAGNOSIS — J45909 Unspecified asthma, uncomplicated: Secondary | ICD-10-CM | POA: Diagnosis not present

## 2023-06-22 DIAGNOSIS — Z6841 Body Mass Index (BMI) 40.0 and over, adult: Secondary | ICD-10-CM | POA: Diagnosis not present

## 2023-06-22 DIAGNOSIS — M542 Cervicalgia: Secondary | ICD-10-CM | POA: Diagnosis not present

## 2023-06-22 DIAGNOSIS — M069 Rheumatoid arthritis, unspecified: Secondary | ICD-10-CM | POA: Diagnosis not present

## 2023-06-22 DIAGNOSIS — D649 Anemia, unspecified: Secondary | ICD-10-CM | POA: Diagnosis not present

## 2023-06-22 DIAGNOSIS — Z7982 Long term (current) use of aspirin: Secondary | ICD-10-CM | POA: Diagnosis not present

## 2023-06-22 DIAGNOSIS — M810 Age-related osteoporosis without current pathological fracture: Secondary | ICD-10-CM | POA: Diagnosis not present

## 2023-06-22 DIAGNOSIS — M19022 Primary osteoarthritis, left elbow: Secondary | ICD-10-CM | POA: Diagnosis not present

## 2023-06-25 DIAGNOSIS — D649 Anemia, unspecified: Secondary | ICD-10-CM | POA: Diagnosis not present

## 2023-06-25 DIAGNOSIS — M069 Rheumatoid arthritis, unspecified: Secondary | ICD-10-CM | POA: Diagnosis not present

## 2023-06-25 DIAGNOSIS — M19022 Primary osteoarthritis, left elbow: Secondary | ICD-10-CM | POA: Diagnosis not present

## 2023-06-25 DIAGNOSIS — Z7982 Long term (current) use of aspirin: Secondary | ICD-10-CM | POA: Diagnosis not present

## 2023-06-25 DIAGNOSIS — M810 Age-related osteoporosis without current pathological fracture: Secondary | ICD-10-CM | POA: Diagnosis not present

## 2023-06-25 DIAGNOSIS — J45909 Unspecified asthma, uncomplicated: Secondary | ICD-10-CM | POA: Diagnosis not present

## 2023-06-25 DIAGNOSIS — M542 Cervicalgia: Secondary | ICD-10-CM | POA: Diagnosis not present

## 2023-06-25 DIAGNOSIS — Z6841 Body Mass Index (BMI) 40.0 and over, adult: Secondary | ICD-10-CM | POA: Diagnosis not present

## 2023-06-26 ENCOUNTER — Ambulatory Visit: Payer: Medicare PPO | Admitting: Physical Medicine and Rehabilitation

## 2023-06-26 ENCOUNTER — Other Ambulatory Visit: Payer: Self-pay

## 2023-06-26 VITALS — BP 124/74 | HR 118

## 2023-06-26 DIAGNOSIS — M47812 Spondylosis without myelopathy or radiculopathy, cervical region: Secondary | ICD-10-CM | POA: Diagnosis not present

## 2023-06-26 MED ORDER — METHYLPREDNISOLONE ACETATE 80 MG/ML IJ SUSP
80.0000 mg | Freq: Once | INTRAMUSCULAR | Status: AC
  Administered 2023-06-26: 80 mg

## 2023-06-26 NOTE — Progress Notes (Unsigned)
Functional Pain Scale - descriptive words and definitions  Distressing (6)    Pain is present/unable to complete most ADLs limited by pain/sleep is difficult and active distraction is only marginal. Moderate range order  Average Pain 7   +Driver, -BT, -Dye Allergies.  Neck pain

## 2023-06-26 NOTE — Patient Instructions (Signed)

## 2023-06-27 DIAGNOSIS — J45909 Unspecified asthma, uncomplicated: Secondary | ICD-10-CM | POA: Diagnosis not present

## 2023-06-27 DIAGNOSIS — M19022 Primary osteoarthritis, left elbow: Secondary | ICD-10-CM | POA: Diagnosis not present

## 2023-06-27 DIAGNOSIS — D649 Anemia, unspecified: Secondary | ICD-10-CM | POA: Diagnosis not present

## 2023-06-27 DIAGNOSIS — Z6841 Body Mass Index (BMI) 40.0 and over, adult: Secondary | ICD-10-CM | POA: Diagnosis not present

## 2023-06-27 DIAGNOSIS — Z7982 Long term (current) use of aspirin: Secondary | ICD-10-CM | POA: Diagnosis not present

## 2023-06-27 DIAGNOSIS — M810 Age-related osteoporosis without current pathological fracture: Secondary | ICD-10-CM | POA: Diagnosis not present

## 2023-06-27 DIAGNOSIS — M069 Rheumatoid arthritis, unspecified: Secondary | ICD-10-CM | POA: Diagnosis not present

## 2023-06-27 DIAGNOSIS — M542 Cervicalgia: Secondary | ICD-10-CM | POA: Diagnosis not present

## 2023-06-27 NOTE — Procedures (Signed)
Cervical Facet Joint Intra-Articular Injection with Fluoroscopic Guidance  Patient: [name]      Date of Birth: 02-06-1951 MRN: [id number] PCP: Deeann Saint, MD      Visit Date: 06/26/2023   Universal Protocol:    Date/Time: 06/26/2409:15 AM  Consent Given By: the patient  Position: PRONE  Additional Comments: Vital signs were monitored before and after the procedure. Patient was prepped and draped in the usual sterile fashion. The correct patient, procedure, and site was verified.   Injection Procedure Details:  Procedure Site One Meds Administered:  Meds ordered this encounter  Medications   methylPREDNISolone acetate (DEPO-MEDROL) injection 80 mg     Laterality: Bilateral  Location/Site:  C2-3  Needle size: 25 G  Needle type: Spinal  Needle Placement: Articular  Findings:  -Contrast Used: 0.5 mL iohexol 180 mg iodine/mL   -Comments: Excellent flow of contrast producing a partial arthrogram.  Procedure Details: The region overlying the facet joints mentioned above were localized under fluoroscopic visualization. The needle was inserted down to the level of the lateral mass of the superior articular process of the facet joint to be injected. Then, the needle was "walked off" inferiorly into the lateral aspect of the facet joint. Bi-planar images were used for confirming placement and spot radiographs were documented.  A 0.25 ml volume of Omnipaque-240 was injected into the facet joint and a standard partial arthrogram was obtained. Radiographs were obtained of the arthrogram. A 0.5 ml. volume of the steroid/anesthetic solution was injected into the joint. This procedure was repeated for each facet joint injected.   Additional Comments:  No complications occurred Dressing: Band-Aid    Post-procedure details: Patient was observed during the procedure. Post-procedure instructions were reviewed.  Patient left the clinic in stable condition.

## 2023-06-27 NOTE — Progress Notes (Signed)
[name] - 72 y.o. female MRN [id number]  Date of birth: 15-Dec-1950  Office Visit Note: Visit Date: 06/26/2023 PCP: Deeann Saint, MD Referred by: Deeann Saint, MD  Subjective: Chief Complaint  Patient presents with   Neck - Pain   HPI:  [name] is a 72 y.o. female who comes in today at the request of Ellin Goodie, FNP for planned Bilateral  C2-3 Cervical facet/medial branch block with fluoroscopic guidance.  The patient has failed conservative care including home exercise, medications, time and activity modification.  This injection will be diagnostic and hopefully therapeutic.  Please see requesting physician notes for further details and justification.  Exam has shown concordant pain with facet joint loading.   ROS Otherwise per HPI.  Assessment & Plan: Visit Diagnoses:    ICD-10-CM   1. Cervical spondylosis without myelopathy  M47.812 XR C-ARM NO REPORT    Facet Injection    methylPREDNISolone acetate (DEPO-MEDROL) injection 80 mg      Plan: No additional findings.   Meds & Orders:  Meds ordered this encounter  Medications   methylPREDNISolone acetate (DEPO-MEDROL) injection 80 mg    Orders Placed This Encounter  Procedures   Facet Injection   XR C-ARM NO REPORT    Follow-up: Return for visit to requesting provider as needed.   Procedures: No procedures performed  Cervical Facet Joint Intra-Articular Injection with Fluoroscopic Guidance  Patient: [name]      Date of Birth: [birth year] MRN: [id number] PCP: Deeann Saint, MD      Visit Date: 06/26/2023   Universal Protocol:    Date/Time: 06/26/2409:15 AM  Consent Given By: the patient  Position: PRONE  Additional Comments: Vital signs were monitored before and after the procedure. Patient was prepped and draped in the usual sterile fashion. The correct patient, procedure, and site was verified.   Injection Procedure Details:  Procedure Site One Meds Administered:   Meds ordered this encounter  Medications   methylPREDNISolone acetate (DEPO-MEDROL) injection 80 mg     Laterality: Bilateral  Location/Site:  C2-3  Needle size: 25 G  Needle type: Spinal  Needle Placement: Articular  Findings:  -Contrast Used: 0.5 mL iohexol 180 mg iodine/mL   -Comments: Excellent flow of contrast producing a partial arthrogram.  Procedure Details: The region overlying the facet joints mentioned above were localized under fluoroscopic visualization. The needle was inserted down to the level of the lateral mass of the superior articular process of the facet joint to be injected. Then, the needle was "walked off" inferiorly into the lateral aspect of the facet joint. Bi-planar images were used for confirming placement and spot radiographs were documented.  A 0.25 ml volume of Omnipaque-240 was injected into the facet joint and a standard partial arthrogram was obtained. Radiographs were obtained of the arthrogram. A 0.5 ml. volume of the steroid/anesthetic solution was injected into the joint. This procedure was repeated for each facet joint injected.   Additional Comments:  No complications occurred Dressing: Band-Aid    Post-procedure details: Patient was observed during the procedure. Post-procedure instructions were reviewed.  Patient left the clinic in stable condition.   Clinical History: CLINICAL DATA:  Eval for possible myelopathy   EXAM: MRI CERVICAL SPINE WITHOUT CONTRAST   TECHNIQUE: Multiplanar, multisequence MR imaging of the cervical spine was performed. No intravenous contrast was administered.   COMPARISON:  Cervical spine radiograph 09/06/2022.   FINDINGS: Alignment: Trace degenerative anterolisthesis at C4-C5 and C6-C7.  Vertebrae: No evidence of acute fracture, discitis, or aggressive osseous lesion. Periarticular marrow edema at the left C2-C3 facet and at the right C4-C5 facet.   Cord: No abnormal cord signal.  Normal cord  morphology.   Posterior Fossa, vertebral arteries, paraspinal tissues: Negative.   Disc levels: The craniocervical junction is unremarkable.   C2-C3: Minimal disc bulging and uncovertebral joint hypertrophy with bilateral facet arthropathy. Minimal spinal canal. No significant neural foraminal stenosis.   C3-C4: Minimal disc bulging and uncovertebral joint hypertrophy with bilateral facet arthropathy. Minimal spinal canal narrowing. No significant neural foraminal stenosis.   C4-C5: Trace degenerative anterolisthesis. Uncovertebral joint hypertrophy with bilateral facet arthropathy. No significant disc bulging. There is moderate right and no significant left neural foraminal stenosis.   C5-C6: Left greater than right uncovertebral joint hypertrophy with bilateral facet arthropathy. No significant disc bulge. Minimal spinal canal stenosis. There is mild-to-moderate left and no signal right neural foraminal narrowing.   C6-C7: Minimal disc bulging with uncovertebral joint hypertrophy and bowel facet arthropathy. No significant spinal canal or neural foraminal stenosis.   C7-T1: Bilateral facet arthropathy. No significant disc bulge. No significant spinal canal or neural foraminal stenosis.   IMPRESSION: Multilevel degenerative changes of the cervical spine, worst at C4-C5, where there is moderate right-sided neural foraminal stenosis.   Mild-to-moderate left-sided neural foraminal stenosis at C5-C6.   Minimal spinal canal narrowing at C2-C3, C3-C4, and C5-C6. No abnormal cord signal.   Periarticular marrow edema at the left C2-C3 and right C4-C5 facet joints related to arthritis.     Electronically Signed   By: Caprice Renshaw M.D.   On: 10/08/2022 09:37     Objective:  VS:  HT:    WT:   BMI:     BP:124/74  HR:(!) 118bpm  TEMP: ( )  RESP:  Physical Exam Vitals and nursing note reviewed.  Constitutional:      General: She is not in acute distress.    Appearance:  Normal appearance. She is not ill-appearing.  HENT:     Head: Normocephalic and atraumatic.     Right Ear: External ear normal.     Left Ear: External ear normal.  Eyes:     Extraocular Movements: Extraocular movements intact.  Cardiovascular:     Rate and Rhythm: Normal rate.     Pulses: Normal pulses.  Musculoskeletal:     Cervical back: Tenderness present. No rigidity.     Right lower leg: No edema.     Left lower leg: No edema.     Comments: Patient has good strength in the upper extremities including 5 out of 5 strength in wrist extension long finger flexion and APB.  There is no atrophy of the hands intrinsically.  There is a negative Hoffmann's test.   Lymphadenopathy:     Cervical: No cervical adenopathy.  Skin:    Findings: No erythema, lesion or rash.  Neurological:     General: No focal deficit present.     Mental Status: She is alert and oriented to person, place, and time.     Sensory: No sensory deficit.     Motor: No weakness or abnormal muscle tone.     Coordination: Coordination normal.  Psychiatric:        Mood and Affect: Mood normal.        Behavior: Behavior normal.      Imaging: XR C-ARM NO REPORT  Result Date: 06/26/2023 Please see Notes tab for imaging impression.

## 2023-06-28 DIAGNOSIS — M810 Age-related osteoporosis without current pathological fracture: Secondary | ICD-10-CM | POA: Diagnosis not present

## 2023-06-28 DIAGNOSIS — M19022 Primary osteoarthritis, left elbow: Secondary | ICD-10-CM | POA: Diagnosis not present

## 2023-06-28 DIAGNOSIS — M069 Rheumatoid arthritis, unspecified: Secondary | ICD-10-CM | POA: Diagnosis not present

## 2023-06-28 DIAGNOSIS — D649 Anemia, unspecified: Secondary | ICD-10-CM | POA: Diagnosis not present

## 2023-06-28 DIAGNOSIS — Z7982 Long term (current) use of aspirin: Secondary | ICD-10-CM | POA: Diagnosis not present

## 2023-06-28 DIAGNOSIS — J45909 Unspecified asthma, uncomplicated: Secondary | ICD-10-CM | POA: Diagnosis not present

## 2023-06-28 DIAGNOSIS — Z6841 Body Mass Index (BMI) 40.0 and over, adult: Secondary | ICD-10-CM | POA: Diagnosis not present

## 2023-06-28 DIAGNOSIS — M542 Cervicalgia: Secondary | ICD-10-CM | POA: Diagnosis not present

## 2023-07-02 DIAGNOSIS — M069 Rheumatoid arthritis, unspecified: Secondary | ICD-10-CM | POA: Diagnosis not present

## 2023-07-02 DIAGNOSIS — M19022 Primary osteoarthritis, left elbow: Secondary | ICD-10-CM | POA: Diagnosis not present

## 2023-07-02 DIAGNOSIS — D649 Anemia, unspecified: Secondary | ICD-10-CM | POA: Diagnosis not present

## 2023-07-02 DIAGNOSIS — Z6841 Body Mass Index (BMI) 40.0 and over, adult: Secondary | ICD-10-CM | POA: Diagnosis not present

## 2023-07-02 DIAGNOSIS — M542 Cervicalgia: Secondary | ICD-10-CM | POA: Diagnosis not present

## 2023-07-02 DIAGNOSIS — J45909 Unspecified asthma, uncomplicated: Secondary | ICD-10-CM | POA: Diagnosis not present

## 2023-07-02 DIAGNOSIS — M810 Age-related osteoporosis without current pathological fracture: Secondary | ICD-10-CM | POA: Diagnosis not present

## 2023-07-02 DIAGNOSIS — Z7982 Long term (current) use of aspirin: Secondary | ICD-10-CM | POA: Diagnosis not present

## 2023-07-04 ENCOUNTER — Ambulatory Visit (INDEPENDENT_AMBULATORY_CARE_PROVIDER_SITE_OTHER): Payer: Medicare PPO | Admitting: Internal Medicine

## 2023-07-04 ENCOUNTER — Encounter (INDEPENDENT_AMBULATORY_CARE_PROVIDER_SITE_OTHER): Payer: Self-pay | Admitting: Internal Medicine

## 2023-07-04 VITALS — BP 125/80 | HR 96 | Temp 98.0°F | Ht <= 58 in | Wt 197.0 lb

## 2023-07-04 DIAGNOSIS — Z6841 Body Mass Index (BMI) 40.0 and over, adult: Secondary | ICD-10-CM | POA: Diagnosis not present

## 2023-07-04 DIAGNOSIS — M542 Cervicalgia: Secondary | ICD-10-CM | POA: Diagnosis not present

## 2023-07-04 DIAGNOSIS — Z7982 Long term (current) use of aspirin: Secondary | ICD-10-CM | POA: Diagnosis not present

## 2023-07-04 DIAGNOSIS — E538 Deficiency of other specified B group vitamins: Secondary | ICD-10-CM

## 2023-07-04 DIAGNOSIS — R29818 Other symptoms and signs involving the nervous system: Secondary | ICD-10-CM

## 2023-07-04 DIAGNOSIS — D649 Anemia, unspecified: Secondary | ICD-10-CM | POA: Diagnosis not present

## 2023-07-04 DIAGNOSIS — R197 Diarrhea, unspecified: Secondary | ICD-10-CM

## 2023-07-04 DIAGNOSIS — M810 Age-related osteoporosis without current pathological fracture: Secondary | ICD-10-CM | POA: Diagnosis not present

## 2023-07-04 DIAGNOSIS — R7303 Prediabetes: Secondary | ICD-10-CM | POA: Insufficient documentation

## 2023-07-04 DIAGNOSIS — J45909 Unspecified asthma, uncomplicated: Secondary | ICD-10-CM | POA: Diagnosis not present

## 2023-07-04 DIAGNOSIS — M19022 Primary osteoarthritis, left elbow: Secondary | ICD-10-CM | POA: Diagnosis not present

## 2023-07-04 DIAGNOSIS — E66813 Obesity, class 3: Secondary | ICD-10-CM

## 2023-07-04 DIAGNOSIS — M069 Rheumatoid arthritis, unspecified: Secondary | ICD-10-CM | POA: Diagnosis not present

## 2023-07-04 MED ORDER — THERA VITAL M PO TABS: 1.0000 | ORAL_TABLET | Freq: Every day | ORAL | Status: AC

## 2023-07-04 NOTE — Assessment & Plan Note (Signed)
Most recent A1c is  Lab Results  Component Value Date   HGBA1C 5.9 (H) 05/22/2023   HGBA1C 5.6 09/01/2020    Patient aware of disease state and risk of progression. This may contribute to abnormal cravings, fatigue and diabetic complications without having diabetes.   We have discussed treatment options which include: losing 7 to 10% of body weight, increasing physical activity to a goal of 150 minutes a week at moderate intensity.  Advised to maintain a diet low on simple and processed carbohydrates.  She may also be a candidate for pharmacoprophylaxis with metformin or incretin mimetic.

## 2023-07-04 NOTE — Progress Notes (Addendum)
Office: [phone]  /  Fax: [phone]  WEIGHT SUMMARY AND BIOMETRICS  Vitals Temp: 98 F (36.7 C) BP: 125/80 Pulse Rate: 96 SpO2: 94 %   Anthropometric Measurements Height: 4\' 10"  (1.473 m) Weight: 197 lb (89.4 kg) BMI (Calculated): 41.18 Weight at Last Visit: 202 lb Weight Lost Since Last Visit: 5 lb Weight Gained Since Last Visit: 0 lb Starting Weight: 202 lb Total Weight Loss (lbs): 5 lb (2.268 kg) Peak Weight: 220 lb Waist Measurement : 42 inches   Body Composition  Body Fat %: 51 % Fat Mass (lbs): 100.6 lbs Muscle Mass (lbs): 91.6 lbs Visceral Fat Rating : 18    RMR: [id number]  Today's Visit #: 2  Starting Date: 05/22/23    Chief Complaint: OBESITY  HPI  Discussed the use of AI scribe software for clinical note transcription with the patient, who gave verbal consent to proceed.  History of Present Illness   The patient, with a history of obesity, rheumatoid arthritis presented for a follow-up consultation regarding her weight management. She reported adherence to a category one plan about fifty percent of the time and engaging in physical therapy and walking for fifteen to twenty minutes three times a week. Since the last visit, the patient has lost five pounds.  However, the patient has been experiencing diarrhea for over three weeks, which she suspects may be a side effect of leflunomide, a medication she has been taking for rheumatoid arthritis since July. She has attempted to identify the cause by eliminating various foods from her diet but believes the medication may be the culprit. The patient also reported a high-stress period, with multiple traumatic events occurring in her life.  The patient admitted to rewarding herself with foods she enjoys during these stressful times, stating that she does not want to feel deprived. She also mentioned a lack of cooking habits, relying mostly on microwave meals, eating out, and consuming salads. She has been  trying to incorporate more whole foods into her diet, but struggles with the restrictions on certain foods she enjoys, such as black-eyed peas and baked beans.  The patient also reported problems with her right foot, which has led her to revert to using a wheelchair from a rollator. She is unsure whether there may be a fracture in the foot and has been unable to seek medical attention due to numerous other doctor's appointments. She plans to seek a second opinion regarding her aggressive rheumatoid arthritis, as she has noticed continued deterioration in her hand joints despite medication.  The patient also reported waking up hungry in the early hours of the morning, around four or five o'clock, despite having a late meal before bed. She has been trying to incorporate more protein into her diet to help with satiety. She also mentioned that she has been experiencing headaches upon waking in the morning and has been feeling fatigued during the day.        Barriers identified: having difficulty preparing healthy meals, having difficulty with meal prep and planning, predilection for convenience or prepackaged foods, limited food variation or food intolerances, low volume of physical acitivity, orthopedic problems, medical conditions or chronic pain affecting mobility, medical comorbidities, presence of obesogenic drugs, inadequate sleep, slow metabolism for age, and possible OSA .   Pharmacotherapy for weight loss: She is currently taking no anti-obesity medication.    ASSESSMENT AND PLAN  TREATMENT PLAN FOR OBESITY:  Recommended Dietary Goals  Mallorie is currently in the action stage of change. As  such, her goal is to continue weight management plan. She has agreed to: incorporate prepackaged healthy meals for convenience  Behavioral Intervention  We discussed the following Behavioral Modification Strategies today: increasing lean protein intake, decreasing simple carbohydrates , increasing  vegetables, increasing lower glycemic fruits, increasing fiber rich foods, avoiding skipping meals, increasing water intake, work on meal planning and preparation, planning for success, and better snacking choices.  Additional resources provided today: Handout on symptoms of sleep apnea, health risk and its effect on weight management  Recommended Physical Activity Goals  Mellina has been advised to work up to 150 minutes of moderate intensity aerobic activity a week and strengthening exercises 2-3 times per week for cardiovascular health, weight loss maintenance and preservation of muscle mass.   She has agreed to :  Increase physical activity in their day and reduce sedentary time (increase NEAT). and tailored advise at the next visit.  Pharmacotherapy We discussed various medication options to help Gia with her weight loss efforts and we both agreed to : continue with nutritional and behavioral strategies  ASSOCIATED CONDITIONS ADDRESSED TODAY  Suspected sleep apnea Assessment & Plan: Patient has symptoms of sleep disordered breathing and high risk phenotype. Counseled on risks associated with undiagnosed / untreated disorder. Recommend she have PSMG. Losing 15% of BW may improve symptoms.  Sleep study ordered through [facility].  She was provided with information regarding the symptoms and also effects on weight.   Orders: -     Ambulatory referral to Sleep Studies  Class 3 severe obesity with serious comorbidity and body mass index (BMI) of 40.0 to 44.9 in adult, unspecified obesity type [facility]) Assessment & Plan: See obesity treatment plan   Low serum vitamin B12 Assessment & Plan: Reviewed previous labs she had a low normal B12 level she has an increased MCV which may be related to methotrexate.  Her methylmalonic acid and homocystine levels are within normal limits.  She is no longer on methotrexate.  I would like for her to start a multivitamin with  minerals.   Prediabetes Assessment & Plan: Most recent A1c is  Lab Results  Component Value Date   HGBA1C 5.9 (H) 05/22/2023   HGBA1C 5.6 09/01/2020    Patient aware of disease state and risk of progression. This may contribute to abnormal cravings, fatigue and diabetic complications without having diabetes.   We have discussed treatment options which include: losing 7 to 10% of body weight, increasing physical activity to a goal of 150 minutes a week at moderate intensity.  Advised to maintain a diet low on simple and processed carbohydrates.  She may also be a candidate for pharmacoprophylaxis with metformin or incretin mimetic.     Diarrhea, unspecified type Assessment & Plan: She suspect is related to leflunomide which according to patient was started in July.  This may affect her nutritional needs.  I recommend that she reach out to prescribing physician to discuss medication side effects.  I also recommend that she keep a diary to determine if there are any trigger foods as she may have a food intolerance.  Unfortunately she does not like to cook and relies on prepackaged foods.   Other orders -     Thera Vital M; Take 1 tablet by mouth daily.    PHYSICAL EXAM:  Blood pressure 125/80, pulse 96, temperature 98 F (36.7 C), height 4\' 10"  (1.473 m), weight 197 lb (89.4 kg), SpO2 94%. Body mass index is 41.17 kg/m.  General: She is  overweight, cooperative, alert, well developed, and in no acute distress. PSYCH: Has normal mood, affect and thought process.   HEENT: EOMI, sclerae are anicteric. Lungs: Normal breathing effort, no conversational dyspnea. Extremities: No edema.  Neurologic: No gross sensory or motor deficits. No tremors or fasciculations noted.    DIAGNOSTIC DATA REVIEWED:  BMET    Component Value Date/Time   NA 138 05/03/2023 1235   K 4.0 05/03/2023 1235   CL 102 05/03/2023 1235   CO2 29 05/03/2023 1235   GLUCOSE 148 (H) 05/03/2023 1235   BUN 7  (L) 05/03/2023 1235   CREATININE 0.58 05/03/2023 1235   CREATININE 0.58 (L) 09/01/2020 1139   CALCIUM 8.8 (L) 05/03/2023 1235   GFRNONAA >60 05/03/2023 1235   GFRNONAA 93 09/01/2020 1139   GFRAA 107 09/01/2020 1139   Lab Results  Component Value Date   HGBA1C 5.9 (H) 05/22/2023   HGBA1C 5.6 09/01/2020   Lab Results  Component Value Date   INSULIN 5.0 05/22/2023   Lab Results  Component Value Date   TSH 0.918 11/09/2022   CBC    Component Value Date/Time   WBC 3.5 (L) 05/03/2023 1235   RBC 3.76 (L) 05/03/2023 1235   HGB 11.8 (L) 05/03/2023 1235   HCT 38.2 05/03/2023 1235   PLT 226 05/03/2023 1235   MCV 101.6 (H) 05/03/2023 1235   MCH 31.4 05/03/2023 1235   MCHC 30.9 05/03/2023 1235   RDW 15.1 05/03/2023 1235   Iron Studies    Component Value Date/Time   IRON 58 05/22/2023 0859   TIBC 370 05/22/2023 0859   FERRITIN 57 05/22/2023 0859   IRONPCTSAT 16 05/22/2023 0859   Lipid Panel     Component Value Date/Time   CHOL 216 (H) 05/22/2023 0859   TRIG 83 05/22/2023 0859   HDL 77 05/22/2023 0859   CHOLHDL 3 06/26/2022 1124   VLDL 23.2 06/26/2022 1124   LDLCALC 124 (H) 05/22/2023 0859   LDLCALC 110 (H) 09/01/2020 1139   Hepatic Function Panel     Component Value Date/Time   PROT 6.4 (L) 05/03/2023 1235   ALBUMIN 3.9 05/03/2023 1235   AST 15 05/03/2023 1235   ALT 11 05/03/2023 1235   ALKPHOS 53 05/03/2023 1235   BILITOT 0.6 05/03/2023 1235   BILIDIR 0.0 09/05/2016 0912      Component Value Date/Time   TSH 0.918 11/09/2022 0023   TSH 1.03 06/26/2022 1124   Nutritional Lab Results  Component Value Date   VD25OH 35.3 05/22/2023   VD25OH 54.11 08/25/2021   VD25OH 14.22 (L) 05/26/2021     Return in about 3 weeks (around 07/25/2023) for For Weight Mangement with Dr. [name] 40 minutes, complex obesity. .. She was informed of the importance of frequent follow up visits to maximize her success with intensive lifestyle modifications for her multiple health  conditions.   ATTESTASTION STATEMENTS:  Reviewed by clinician on day of visit: allergies, medications, problem list, medical history, surgical history, family history, social history, and previous encounter notes.     Worthy Rancher, MD

## 2023-07-04 NOTE — Assessment & Plan Note (Signed)
She suspect is related to leflunomide which according to patient was started in July.  This may affect her nutritional needs.  I recommend that she reach out to prescribing physician to discuss medication side effects.  I also recommend that she keep a diary to determine if there are any trigger foods as she may have a food intolerance.  Unfortunately she does not like to cook and relies on prepackaged foods.

## 2023-07-04 NOTE — Assessment & Plan Note (Signed)
 See obesity treatment plan

## 2023-07-04 NOTE — Assessment & Plan Note (Signed)
Patient has symptoms of sleep disordered breathing and high risk phenotype. Counseled on risks associated with undiagnosed / untreated disorder. Recommend she have PSMG. Losing 15% of BW may improve symptoms.  Sleep study ordered through [facility].  She was provided with information regarding the symptoms and also effects on weight.

## 2023-07-04 NOTE — Assessment & Plan Note (Signed)
Reviewed previous labs she had a low normal B12 level she has an increased MCV which may be related to methotrexate.  Her methylmalonic acid and homocystine levels are within normal limits.  She is no longer on methotrexate.  I would like for her to start a multivitamin with minerals.

## 2023-07-05 ENCOUNTER — Telehealth: Payer: Self-pay | Admitting: Family Medicine

## 2023-07-05 DIAGNOSIS — Z7982 Long term (current) use of aspirin: Secondary | ICD-10-CM | POA: Diagnosis not present

## 2023-07-05 DIAGNOSIS — M069 Rheumatoid arthritis, unspecified: Secondary | ICD-10-CM | POA: Diagnosis not present

## 2023-07-05 DIAGNOSIS — Z6841 Body Mass Index (BMI) 40.0 and over, adult: Secondary | ICD-10-CM | POA: Diagnosis not present

## 2023-07-05 DIAGNOSIS — D649 Anemia, unspecified: Secondary | ICD-10-CM | POA: Diagnosis not present

## 2023-07-05 DIAGNOSIS — M19022 Primary osteoarthritis, left elbow: Secondary | ICD-10-CM | POA: Diagnosis not present

## 2023-07-05 DIAGNOSIS — M810 Age-related osteoporosis without current pathological fracture: Secondary | ICD-10-CM | POA: Diagnosis not present

## 2023-07-05 DIAGNOSIS — M542 Cervicalgia: Secondary | ICD-10-CM | POA: Diagnosis not present

## 2023-07-05 DIAGNOSIS — J45909 Unspecified asthma, uncomplicated: Secondary | ICD-10-CM | POA: Diagnosis not present

## 2023-07-05 NOTE — Telephone Encounter (Signed)
error 

## 2023-07-06 ENCOUNTER — Other Ambulatory Visit (HOSPITAL_BASED_OUTPATIENT_CLINIC_OR_DEPARTMENT_OTHER): Payer: Self-pay

## 2023-07-06 ENCOUNTER — Ambulatory Visit: Payer: Medicare PPO | Admitting: Family Medicine

## 2023-07-06 ENCOUNTER — Encounter: Payer: Self-pay | Admitting: Family Medicine

## 2023-07-06 VITALS — BP 122/76 | HR 75 | Temp 98.4°F | Ht <= 58 in | Wt 200.2 lb

## 2023-07-06 DIAGNOSIS — M79671 Pain in right foot: Secondary | ICD-10-CM | POA: Diagnosis not present

## 2023-07-06 DIAGNOSIS — M059 Rheumatoid arthritis with rheumatoid factor, unspecified: Secondary | ICD-10-CM | POA: Diagnosis not present

## 2023-07-06 DIAGNOSIS — M792 Neuralgia and neuritis, unspecified: Secondary | ICD-10-CM | POA: Diagnosis not present

## 2023-07-06 DIAGNOSIS — M069 Rheumatoid arthritis, unspecified: Secondary | ICD-10-CM | POA: Diagnosis not present

## 2023-07-06 DIAGNOSIS — E538 Deficiency of other specified B group vitamins: Secondary | ICD-10-CM

## 2023-07-06 DIAGNOSIS — Z23 Encounter for immunization: Secondary | ICD-10-CM | POA: Diagnosis not present

## 2023-07-06 MED ORDER — GABAPENTIN 300 MG PO CAPS
300.0000 mg | ORAL_CAPSULE | Freq: Two times a day (BID) | ORAL | 3 refills | Status: DC
Start: 2023-07-06 — End: 2024-04-18

## 2023-07-06 MED ORDER — CYANOCOBALAMIN 1000 MCG/ML IJ SOLN
1000.0000 ug | Freq: Once | INTRAMUSCULAR | Status: AC
Start: 2023-07-06 — End: ?

## 2023-07-06 MED ORDER — COVID-19 MRNA VAC-TRIS(PFIZER) 30 MCG/0.3ML IM SUSY
0.3000 mL | PREFILLED_SYRINGE | Freq: Once | INTRAMUSCULAR | 0 refills | Status: AC
  Filled 2023-07-06: qty 0.3, 1d supply, fill #0

## 2023-07-06 NOTE — Progress Notes (Unsigned)
Established Patient Office Visit   Subjective  Patient ID: [name], female    DOB: [birth year]  Age: 72 y.o. MRN: [id number]  Chief Complaint  Patient presents with   Foot Pain    Started 3 weeks ago, top of the right foot, sharp pain, rate of pain 7 out of 10,  and left ankle swelling, patient would like Vit B-12 shot and Vit D and Gabapentin refilled     Pt is a 71 yo female seen for ongoing concern and refills.  Pt with R mid foot pain x 3 wks.  Denies injury or falls.  Pain  is sharp, 7/10 with standing on foot.  Pain radiates from dorsum of mid foot to lateral ankle.  Pt also with ankle edema.  Was using rollator, but using wheelchair more due to the pain.  Requesting refill on gabapentin 300 mg BID.  No longer on Cymbalta.  Would like Vit B12 injection as unable to given herself injection due to RA in b/l hands.  Not currently on methotrexate but still taking folic acid. On Kevzara injections, not sure if they are helping.  Also taking low-dose prednisone.    Patient Active Problem List   Diagnosis Date Noted   Prediabetes 07/04/2023   Diarrhea 07/04/2023   Abnormal metabolism 05/22/2023   Weight gain due to medication 05/22/2023   Low serum vitamin B12 05/22/2023   Class 3 severe obesity with serious comorbidity and body mass index (BMI) of 40.0 to 44.9 in adult [facility]) 05/22/2023   Anemia 05/22/2023   Depression screen 05/22/2023   Other fatigue 05/22/2023   Suspected sleep apnea 05/22/2023   Rheumatoid arthritis (HCC) 11/29/2022   Infection due to ESBL-producing Escherichia coli 11/09/2022   Acute pyelonephritis 11/09/2022   Bacteremia 11/09/2022   Sepsis (HCC) 11/09/2022   SOB (shortness of breath) on exertion 11/08/2022   DJD (degenerative joint disease) of knee 04/13/2022   S/P TKR (total knee replacement), left 04/13/2022   Arthritis of right knee    S/P total knee arthroplasty, right 12/08/2021   Pressure injury of skin 08/27/2021   Fall 08/23/2021    Difficulty in walking 08/23/2021   Bilateral lower extremity edema 08/23/2021   Hormone replacement therapy 08/23/2021   Osteoporosis 01/28/2021   Osteoarthritis of right wrist 01/28/2021   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   Foot pain 09/13/2020   Knee pain 08/24/2018   Fall in home 07/14/2015   TIBIALIS TENDINITIS 08/25/2009   CHEST PAIN, OTHER, PAIN 10/21/2008   PEDAL EDEMA 07/22/2008   ANKLE PAIN, BILATERAL 04/01/2008   LOW BACK PAIN 04/01/2008   ACUTE BRONCHITIS 09/23/2007   SARCOIDOSIS 09/02/2007   Asthma 09/02/2007   Diverticulosis of colon 07/11/2007   Backache 07/11/2007   Past Medical History:  Diagnosis Date   Acute bronchitis    Allergy    Anemia    Anxiety    Arthritis    knees   Asthma    Back pain    Backache, unspecified    Cataract    removed both eyes   Depression    Diverticulitis    Diverticulosis of colon (without mention of hemorrhage)    Edema    GERD (gastroesophageal reflux disease)    Glaucoma    Joint pain    Lactose intolerance    Lumbago    Neuropathy    Obesity    Osteoarthritis    Other chest pain    Pain in joint, ankle and foot  Rheumatoid arthritis (HCC)    Sarcoidosis    SOB (shortness of breath)    Tibialis tendinitis    Unspecified asthma(493.90)    Past Surgical History:  Procedure Laterality Date   ABDOMINAL HYSTERECTOMY     CATARACT EXTRACTION W/PHACO Right 06/24/2014   Procedure: CATARACT EXTRACTION PHACO AND INTRAOCULAR LENS PLACEMENT (IOC) RIGHT EYE WITH GONIOSYNECHIALYSIS;  Surgeon: Chalmers Guest, MD;  Location: [facility] OR;  Service: Ophthalmology;  Laterality: Right;   COLONOSCOPY     EYE SURGERY Left    cataract surgery   HAND SURGERY Right    trigger finger release   IR RADIOLOGIST EVAL & MGMT  11/14/2018   KNEE ARTHROSCOPY Right    TOTAL KNEE ARTHROPLASTY Right 12/08/2021   Procedure: RIGHT TOTAL KNEE ARTHROPLASTY APPLICATION OF WOUND VAC;  Surgeon: Cammy Copa, MD;  Location: [facility] OR;  Service:  Orthopedics;  Laterality: Right;   TOTAL KNEE ARTHROPLASTY Left 04/13/2022   Procedure: LEFT TOTAL KNEE ARTHROPLASTY;  Surgeon: Cammy Copa, MD;  Location: [facility] OR;  Service: Orthopedics;  Laterality: Left;   Social History   Tobacco Use   Smoking status: Never   Smokeless tobacco: Never  Vaping Use   Vaping status: Never Used  Substance Use Topics   Alcohol use: Yes    Comment: 1 glass of wine or mixed drink once a week   Drug use: No   Family History  Problem Relation Age of Onset   Hypertension Mother    Stroke Mother    Depression Mother    Anxiety disorder Mother    Obesity Mother    Diabetes Father    Sarcoidosis Sister    Colon cancer Neg Hx    Colon polyps Neg Hx    Esophageal cancer Neg Hx    Rectal cancer Neg Hx    Stomach cancer Neg Hx    Allergies  Allergen Reactions   Invanz [Ertapenem] Swelling    "Lips swelled up"   Ibuprofen Rash   Sulfa Antibiotics Rash      ROS Negative unless stated above    Objective:     BP 122/76 (BP Location: Left Arm, Patient Position: Sitting, Cuff Size: Normal)   Pulse 75   Temp 98.4 F (36.9 C) (Oral)   Ht 4\' 10"  (1.473 m)   Wt 200 lb 3.2 oz (90.8 kg)   SpO2 91%   BMI 41.84 kg/m  BP Readings from Last 3 Encounters:  07/06/23 122/76  07/04/23 125/80  06/26/23 124/74   Wt Readings from Last 3 Encounters:  07/06/23 200 lb 3.2 oz (90.8 kg)  07/04/23 197 lb (89.4 kg)  05/22/23 202 lb (91.6 kg)      Physical Exam Constitutional:      General: She is not in acute distress.    Appearance: Normal appearance.  HENT:     Head: Normocephalic and atraumatic.     Nose: Nose normal.     Mouth/Throat:     Mouth: Mucous membranes are moist.  Cardiovascular:     Rate and Rhythm: Normal rate and regular rhythm.     Heart sounds: Normal heart sounds. No murmur heard.    No gallop.  Pulmonary:     Effort: Pulmonary effort is normal. No respiratory distress.     Breath sounds: Normal breath sounds. No  wheezing, rhonchi or rales.  Musculoskeletal:       Legs:     Comments: No deformities of bilateral feet.  Edema of R ankle laterally.  TTP of right midfoot second metatarsal to base of lateral ankle.  No pain with plantarflexion or dorsiflexion of right foot.  Skin:    General: Skin is warm and dry.     Comments: Onychomycosis of nails on bilateral feet.  Neurological:     Mental Status: She is alert and oriented to person, place, and time. Mental status is at baseline.     Comments: Sitting in transport wheelchair.  Gait not assessed.      No results found for any visits on 07/06/23.    Assessment & Plan:  Right foot pain -Discussed possible causes of pain including arthritis, muscle strain from rolling ankle/foot laterally while using wheelchair.  Gout less likely. -per chart review, pt seen by [facility] on 05/30/23, s/p steroid injection for OA in b/l feet and ankles. -Discussed supportive care including rest, heat, topical analgesics, tylenol, Mobic, etc. -Obtain imaging.  Further recommendations based on results. -f/u with Podiatry. -     DG Foot Complete Right  Need for influenza vaccination -     Flu Vaccine Trivalent High Dose (Fluad)  Vitamin B 12 deficiency -     Cyanocobalamin   Rheumatoid Arthritis with positive rheumatoid factor, involving unspecified site (HCC) -b/l hands with mild-moderate deformity of fingers -continue prednisone 5 mg daily, Kevzara injections.  Pt reports she is still taking folate. -no longer taking MTX -continue f/u with Rheumatology  Neuralgia -     Gabapentin; Take 1 capsule (300 mg total) by mouth 2 (two) times daily.  Dispense: 180 capsule; Refill: 3  Pt to have COVID vaccine at [facility] or pharmacy.  No follow-ups on file.   Deeann Saint, MD

## 2023-07-08 ENCOUNTER — Encounter: Payer: Self-pay | Admitting: Family Medicine

## 2023-07-10 ENCOUNTER — Ambulatory Visit: Payer: Medicare PPO | Admitting: Physical Medicine and Rehabilitation

## 2023-07-10 ENCOUNTER — Encounter: Payer: Self-pay | Admitting: Physical Medicine and Rehabilitation

## 2023-07-10 DIAGNOSIS — M47812 Spondylosis without myelopathy or radiculopathy, cervical region: Secondary | ICD-10-CM

## 2023-07-10 DIAGNOSIS — M542 Cervicalgia: Secondary | ICD-10-CM

## 2023-07-10 DIAGNOSIS — M5481 Occipital neuralgia: Secondary | ICD-10-CM

## 2023-07-10 NOTE — Progress Notes (Unsigned)
Functional Pain Scale - descriptive words and definitions  Uncomfortable (3)  Pain is present but can complete all ADL's/sleep is slightly affected and passive distraction only gives marginal relief. Mild range order  Average Pain  varies  Neck pain. 2 week post injection. Neck pain is better, but has a sensation in the back of the head

## 2023-07-10 NOTE — Progress Notes (Unsigned)
[name] - 72 y.o. female MRN [id number]  Date of birth: 20-Oct-1950  Office Visit Note: Visit Date: 07/10/2023 PCP: Deeann Saint, MD Referred by: Deeann Saint, MD  Subjective: Chief Complaint  Patient presents with   Lower Back - Pain   HPI: [name] is a 72 y.o. female who comes in today for evaluation of chronic bilateral neck pain radiating up to her head/ears, left greater than right. Pain ongoing for several years, worsens with movement and activity, she describes pain as aching and pressure sensation, currently denies neck pain. She continues with home exercise regimen, rest and use of over the counter medications as needed. Cervical MRI imaging from January shows periarticular marrow edema at the left C2-C3 and right C4-C5 facet joints related to arthritis. No high grade spinal canal stenosis. Patient recently underwent bilateral C2-C3 facet joint injections in our office on 06/26/2023, she reports complete resolution of neck pain. She reports increased range of motion post injection. Overall, she feels much better however pain to ears and both temporal regions of her head remains. States this pain is intermittent, describes as tingling, soreness and sharp sensation to the tops of her ears. Patient denies focal weakness. No recent trauma or falls. She is using wheelchair to assist with ambulation.     Review of Systems  Neurological:  Positive for tingling. Negative for focal weakness and weakness.  All other systems reviewed and are negative.  Otherwise per HPI.  Assessment & Plan: Visit Diagnoses:    ICD-10-CM   1. Cervicalgia  M54.2 Ambulatory referral to Neurology    2. Facet arthropathy, cervical  M47.812 Ambulatory referral to Neurology    3. Bilateral occipital neuralgia  M54.81 Ambulatory referral to Neurology       Plan: Findings:  Chronic bilateral neck pain radiating up to her head/ears, left greater than right. Significant and sustained  relief of bilateral neck pain with recent bilateral C2-C3 facet joint injections. She continues with conservative therapies as needed. Patients clinical presentation and exam are consistent with facet mediated pain. There is periarticular marrow edema at the left C2-C3 and right C4-C5 facet joints. Overall, she is doing much better however today she reports pain/tingling sensation to superior ears radiating to temporal regions. This pain is intermittent but seems to impact her daily life negatively. This pain is most consistent with lesser occipital nerve neuralgia, the anatomical locations of the lesser occipital nerves at C2, C3 does correlate with her pain pattern. Dr. Alvester Morin and myself feel she would benefit from referral to neurologist to evaluate further. I will go ahead and place referral to [facility] for evaluation/treatment. Patient has no questions at this time. If her neck pain persists we discussed repeating cervical facet injections infrequently as needed. No red flag symptoms noted upon exam today.     Meds & Orders: No orders of the defined types were placed in this encounter.   Orders Placed This Encounter  Procedures   Ambulatory referral to Neurology    Follow-up: Return if symptoms worsen or fail to improve.   Procedures: No procedures performed      Clinical History: CLINICAL DATA:  Eval for possible myelopathy   EXAM: MRI CERVICAL SPINE WITHOUT CONTRAST   TECHNIQUE: Multiplanar, multisequence MR imaging of the cervical spine was performed. No intravenous contrast was administered.   COMPARISON:  Cervical spine radiograph 09/06/2022.   FINDINGS: Alignment: Trace degenerative anterolisthesis at C4-C5 and C6-C7.   Vertebrae: No evidence of acute  fracture, discitis, or aggressive osseous lesion. Periarticular marrow edema at the left C2-C3 facet and at the right C4-C5 facet.   Cord: No abnormal cord signal.  Normal cord morphology.   Posterior Fossa,  vertebral arteries, paraspinal tissues: Negative.   Disc levels: The craniocervical junction is unremarkable.   C2-C3: Minimal disc bulging and uncovertebral joint hypertrophy with bilateral facet arthropathy. Minimal spinal canal. No significant neural foraminal stenosis.   C3-C4: Minimal disc bulging and uncovertebral joint hypertrophy with bilateral facet arthropathy. Minimal spinal canal narrowing. No significant neural foraminal stenosis.   C4-C5: Trace degenerative anterolisthesis. Uncovertebral joint hypertrophy with bilateral facet arthropathy. No significant disc bulging. There is moderate right and no significant left neural foraminal stenosis.   C5-C6: Left greater than right uncovertebral joint hypertrophy with bilateral facet arthropathy. No significant disc bulge. Minimal spinal canal stenosis. There is mild-to-moderate left and no signal right neural foraminal narrowing.   C6-C7: Minimal disc bulging with uncovertebral joint hypertrophy and bowel facet arthropathy. No significant spinal canal or neural foraminal stenosis.   C7-T1: Bilateral facet arthropathy. No significant disc bulge. No significant spinal canal or neural foraminal stenosis.   IMPRESSION: Multilevel degenerative changes of the cervical spine, worst at C4-C5, where there is moderate right-sided neural foraminal stenosis.   Mild-to-moderate left-sided neural foraminal stenosis at C5-C6.   Minimal spinal canal narrowing at C2-C3, C3-C4, and C5-C6. No abnormal cord signal.   Periarticular marrow edema at the left C2-C3 and right C4-C5 facet joints related to arthritis.     Electronically Signed   By: Caprice Renshaw M.D.   On: 10/08/2022 09:37   She reports that she has never smoked. She has never used smokeless tobacco.  Recent Labs    05/22/23 0859  HGBA1C 5.9*    Objective:  VS:  HT:    WT:   BMI:     BP:   HR: bpm  TEMP: ( )  RESP:  Physical Exam Vitals and nursing note  reviewed.  HENT:     Head: Normocephalic and atraumatic.     Right Ear: External ear normal.     Left Ear: External ear normal.     Nose: Nose normal.     Mouth/Throat:     Mouth: Mucous membranes are moist.  Eyes:     Pupils: Pupils are equal, round, and reactive to light.  Cardiovascular:     Rate and Rhythm: Normal rate.     Pulses: Normal pulses.  Pulmonary:     Effort: Pulmonary effort is normal.  Abdominal:     General: Abdomen is flat. There is no distension.  Musculoskeletal:        General: Tenderness present.     Cervical back: Normal range of motion.     Comments: No discomfort noted with flexion, extension and side-to-side rotation. Patient has good strength in the upper extremities including 5 out of 5 strength in wrist extension, long finger flexion and APB. Shoulder range of motion is full bilaterally without any sign of impingement. There is no atrophy of the hands intrinsically. Sensation intact bilaterally. Tingling and pain to bilateral temporal regions. Negative Hoffman's sign. Negative Spurling's sign.     Skin:    General: Skin is warm and dry.     Capillary Refill: Capillary refill takes less than 2 seconds.  Neurological:     General: No focal deficit present.     Mental Status: She is alert and oriented to person, place, and time.  Psychiatric:  Mood and Affect: Mood normal.        Behavior: Behavior normal.     Ortho Exam  Imaging: No results found.  Past Medical/Family/Surgical/Social History: Medications & Allergies reviewed per EMR, new medications updated. Patient Active Problem List   Diagnosis Date Noted   Prediabetes 07/04/2023   Diarrhea 07/04/2023   Abnormal metabolism 05/22/2023   Weight gain due to medication 05/22/2023   Low serum vitamin B12 05/22/2023   Class 3 severe obesity with serious comorbidity and body mass index (BMI) of 40.0 to 44.9 in adult [facility]) 05/22/2023   Anemia 05/22/2023   Depression screen 05/22/2023    Other fatigue 05/22/2023   Suspected sleep apnea 05/22/2023   Rheumatoid arthritis (HCC) 11/29/2022   Infection due to ESBL-producing Escherichia coli 11/09/2022   Acute pyelonephritis 11/09/2022   Bacteremia 11/09/2022   Sepsis (HCC) 11/09/2022   SOB (shortness of breath) on exertion 11/08/2022   DJD (degenerative joint disease) of knee 04/13/2022   S/P TKR (total knee replacement), left 04/13/2022   Arthritis of right knee    S/P total knee arthroplasty, right 12/08/2021   Pressure injury of skin 08/27/2021   Fall 08/23/2021   Difficulty in walking 08/23/2021   Bilateral lower extremity edema 08/23/2021   Hormone replacement therapy 08/23/2021   Osteoporosis 01/28/2021   Osteoarthritis of right wrist 01/28/2021   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   Foot pain 09/13/2020   Knee pain 08/24/2018   Fall in home 07/14/2015   TIBIALIS TENDINITIS 08/25/2009   CHEST PAIN, OTHER, PAIN 10/21/2008   PEDAL EDEMA 07/22/2008   ANKLE PAIN, BILATERAL 04/01/2008   LOW BACK PAIN 04/01/2008   ACUTE BRONCHITIS 09/23/2007   SARCOIDOSIS 09/02/2007   Asthma 09/02/2007   Diverticulosis of colon 07/11/2007   Backache 07/11/2007   Past Medical History:  Diagnosis Date   Acute bronchitis    Allergy    Anemia    Anxiety    Arthritis    knees   Asthma    Back pain    Backache, unspecified    Cataract    removed both eyes   Depression    Diverticulitis    Diverticulosis of colon (without mention of hemorrhage)    Edema    GERD (gastroesophageal reflux disease)    Glaucoma    Joint pain    Lactose intolerance    Lumbago    Neuropathy    Obesity    Osteoarthritis    Other chest pain    Pain in joint, ankle and foot    Rheumatoid arthritis (HCC)    Sarcoidosis    SOB (shortness of breath)    Tibialis tendinitis    Unspecified asthma(493.90)    Family History  Problem Relation Age of Onset   Hypertension Mother    Stroke Mother    Depression Mother    Anxiety disorder Mother     Obesity Mother    Diabetes Father    Sarcoidosis Sister    Colon cancer Neg Hx    Colon polyps Neg Hx    Esophageal cancer Neg Hx    Rectal cancer Neg Hx    Stomach cancer Neg Hx    Past Surgical History:  Procedure Laterality Date   ABDOMINAL HYSTERECTOMY     CATARACT EXTRACTION W/PHACO Right 06/24/2014   Procedure: CATARACT EXTRACTION PHACO AND INTRAOCULAR LENS PLACEMENT (IOC) RIGHT EYE WITH GONIOSYNECHIALYSIS;  Surgeon: Chalmers Guest, MD;  Location: [facility] OR;  Service: Ophthalmology;  Laterality: Right;   COLONOSCOPY  EYE SURGERY Left    cataract surgery   HAND SURGERY Right    trigger finger release   IR RADIOLOGIST EVAL & MGMT  11/14/2018   KNEE ARTHROSCOPY Right    TOTAL KNEE ARTHROPLASTY Right 12/08/2021   Procedure: RIGHT TOTAL KNEE ARTHROPLASTY APPLICATION OF WOUND VAC;  Surgeon: Cammy Copa, MD;  Location: [facility] OR;  Service: Orthopedics;  Laterality: Right;   TOTAL KNEE ARTHROPLASTY Left 04/13/2022   Procedure: LEFT TOTAL KNEE ARTHROPLASTY;  Surgeon: Cammy Copa, MD;  Location: [facility] OR;  Service: Orthopedics;  Laterality: Left;   Social History   Occupational History   Occupation: Pensions consultant (semi-retired)  Tobacco Use   Smoking status: Never   Smokeless tobacco: Never  Vaping Use   Vaping status: Never Used  Substance and Sexual Activity   Alcohol use: Yes    Comment: 1 glass of wine or mixed drink once a week   Drug use: No   Sexual activity: Not on file

## 2023-07-11 ENCOUNTER — Telehealth: Payer: Self-pay | Admitting: Physical Medicine and Rehabilitation

## 2023-07-11 DIAGNOSIS — Z6841 Body Mass Index (BMI) 40.0 and over, adult: Secondary | ICD-10-CM | POA: Diagnosis not present

## 2023-07-11 DIAGNOSIS — D649 Anemia, unspecified: Secondary | ICD-10-CM | POA: Diagnosis not present

## 2023-07-11 DIAGNOSIS — J45909 Unspecified asthma, uncomplicated: Secondary | ICD-10-CM | POA: Diagnosis not present

## 2023-07-11 DIAGNOSIS — M542 Cervicalgia: Secondary | ICD-10-CM | POA: Diagnosis not present

## 2023-07-11 DIAGNOSIS — M069 Rheumatoid arthritis, unspecified: Secondary | ICD-10-CM | POA: Diagnosis not present

## 2023-07-11 DIAGNOSIS — M810 Age-related osteoporosis without current pathological fracture: Secondary | ICD-10-CM | POA: Diagnosis not present

## 2023-07-11 DIAGNOSIS — Z7982 Long term (current) use of aspirin: Secondary | ICD-10-CM | POA: Diagnosis not present

## 2023-07-11 DIAGNOSIS — M19022 Primary osteoarthritis, left elbow: Secondary | ICD-10-CM | POA: Diagnosis not present

## 2023-07-11 NOTE — Telephone Encounter (Signed)
Charrise called. She is the PT working with patient in home. Need verbal orders for PT 2wk 3, 1wk 1. Her cb# [phone]

## 2023-07-13 ENCOUNTER — Ambulatory Visit (INDEPENDENT_AMBULATORY_CARE_PROVIDER_SITE_OTHER): Payer: Medicare PPO | Admitting: Neurology

## 2023-07-13 ENCOUNTER — Encounter: Payer: Self-pay | Admitting: Neurology

## 2023-07-13 VITALS — BP 145/83 | HR 98 | Ht 60.0 in | Wt 197.0 lb

## 2023-07-13 DIAGNOSIS — M5481 Occipital neuralgia: Secondary | ICD-10-CM | POA: Diagnosis not present

## 2023-07-13 DIAGNOSIS — M542 Cervicalgia: Secondary | ICD-10-CM | POA: Diagnosis not present

## 2023-07-13 NOTE — Progress Notes (Signed)
Chief Complaint  Patient presents with   New Patient (Initial Visit)    Rm 14. Patient with friend Corrie Dandy, patient states she is doing okay.       ASSESSMENT AND PLAN  [name] is a 72 y.o. female   Cervical degenerative disease Left occipital area pain, radiating pain to left parietal region,  Significant tenderness upon deep palpitation, suggestive of musculoskeletal etiology, likely a component of left occipital neuralgia, performed trigger point injection today,  She is to call clinic for progress report  DIAGNOSTIC DATA (LABS, IMAGING, TESTING) - I reviewed patient records, labs, notes, testing and imaging myself where available.   MEDICAL HISTORY:  [name] is a 72 year old female, seen in request by Ortho clinic [facility] for evaluation of left occipital area pain, her primary care physician is Dr., Salomon Fick, Carollee Herter  History is obtained from the patient and review of electronic medical records. I personally reviewed pertinent available imaging films in PACS.   PMHx of  RA HTN Osteoporosis Sarcoidosis History of bilateral knee replacement  She lives alone, came in wheelchair, ambulate with walker at home, suffered rheumatoid arthritis, multiple joints pain, significant neck pain, underwent her first epidural injection on June 25, 2024 at orthopedic clinic, which did help her neck pain but she still have residual transient sharp radiating feeling from left occipital area to left parietal region, seems to be triggered by neck movement,  Above symptoms has present for few months, along with her limited range of neck, neck tightness, neck pain, reported significant improvement after epidural injection, but still have left occipital area shooting pain  MRI of cervical spine was done at the Ortho clinic, I do not have the formal report, She has significant tenderness along left nuchal line on today's examination,  PHYSICAL EXAM:   Vitals:    07/13/23 1020 07/13/23 1022  BP: (!) 173/89 (!) 145/83  Pulse: (!) 103 98  Weight: 197 lb (89.4 kg)   Height: 5' (1.524 m)    Body mass index is 38.47 kg/m.  PHYSICAL EXAMNIATION:  Gen: NAD, conversant, well nourised, well groomed                     Cardiovascular: Regular rate rhythm, no peripheral edema, warm, nontender. Eyes: Conjunctivae clear without exudates or hemorrhage Neck: Supple, no carotid bruits.  Significant tenderness of left upper cervical region along left nuchal line upon deep palpitation Pulmonary: Clear to auscultation bilaterally   NEUROLOGICAL EXAM:  MENTAL STATUS: Speech/cognition: Awake, alert, oriented to history taking and casual conversation CRANIAL NERVES: CN II: Visual fields are full to confrontation. Pupils are round equal and briskly reactive to light. CN III, IV, VI: extraocular movement are normal. No ptosis. CN V: Facial sensation is intact to light touch CN VII: Face is symmetric with normal eye closure  CN VIII: Hearing is normal to causal conversation. CN IX, X: Phonation is normal. CN XI: Head turning and shoulder shrug are intact  MOTOR: There is no pronator drift of out-stretched arms. Muscle bulk and tone are normal. Muscle strength is normal.  REFLEXES: Reflexes are hypoactive and symmetric at the biceps, triceps, knees, and ankles. Plantar responses are flexor.  SENSORY: Intact to light touch, pinprick and vibratory sensation are intact in fingers and toes.  COORDINATION: There is no trunk or limb dysmetria noted.  GAIT/STANCE: Deferred  REVIEW OF SYSTEMS:  Full 14 system review of systems performed and notable only for as above All other review  of systems were negative.   ALLERGIES: Allergies  Allergen Reactions   Invanz [Ertapenem] Swelling    "Lips swelled up"   Ibuprofen Rash   Sulfa Antibiotics Rash    HOME MEDICATIONS: Current Outpatient Medications  Medication Sig Dispense Refill   acetaminophen  (TYLENOL) 500 MG tablet Take 1,000 mg by mouth in the morning and at bedtime.     albuterol (VENTOLIN HFA) 108 (90 Base) MCG/ACT inhaler TAKE 2 PUFFS BY MOUTH EVERY 6 HOURS AS NEEDED (Patient taking differently: Inhale 2 puffs into the lungs every 6 (six) hours as needed for wheezing or shortness of breath. TAKE 2 PUFFS BY MOUTH EVERY 6 HOURS AS NEEDED) 18 g 3   alendronate (FOSAMAX) 70 MG tablet Take 70 mg by mouth every Monday.     amLODipine (NORVASC) 5 MG tablet Take 1 tablet (5 mg total) by mouth daily. 90 tablet 1   amoxicillin (AMOXIL) 500 MG capsule Take 2,000 mg by mouth See admin instructions. Take 2000mg  by mouth 1 hour before appt.     aspirin EC 325 MG tablet Take 325 mg by mouth daily.     Blood Pressure Monitor MISC 1 Device by Does not apply route daily. 1 each 0   Cholecalciferol (VITAMIN D3) 50 MCG (2000 UT) capsule      cyanocobalamin (VITAMIN B12) 1000 MCG/ML injection Inject 1,000 mcg into the muscle every 30 (thirty) days.     cycloSPORINE (RESTASIS) 0.05 % ophthalmic emulsion Place 1 drop into both eyes 2 (two) times daily.     fexofenadine-pseudoephedrine (ALLEGRA-D ALLERGY & CONGESTION) 60-120 MG 12 hr tablet Take 1 tablet by mouth every morning.     fluticasone (FLONASE) 50 MCG/ACT nasal spray Place 1 spray into both nostrils daily. 16 g 3   folic acid (FOLVITE) 1 MG tablet Take 1 tablet by mouth once a day 90 tablet 1   gabapentin (NEURONTIN) 300 MG capsule Take 1 capsule (300 mg total) by mouth 2 (two) times daily. 180 capsule 3   IYUZEH 0.005 % SOLN SMARTSIG:1 Drop(s) In Eye(s) Every Evening     KEVZARA 200 MG/1. SOAJ Inject into the skin.     Lactase (LACTAID FAST ACT) 9000 units CHEW Chew by mouth. Dosage unknown     leflunomide (ARAVA) 20 MG tablet Take 20 mg by mouth daily.     levocetirizine (XYZAL) 5 MG tablet 1 tablet by mouth every evening 30 tablet 1   LUMIGAN 0.01 % SOLN SMARTSIG:1 Drop(s) In Eye(s) Every Evening     meloxicam (MOBIC) 15 MG tablet Take 1  tablet (15 mg total) by mouth daily. 30 tablet 0   methotrexate (RHEUMATREX) 2.5 MG tablet Take 20 mg by mouth every Wednesday.     Multiple Vitamins-Minerals (MULTIVITAMIN) tablet Take 1 tablet by mouth daily.     pantoprazole (PROTONIX) 40 MG tablet Take 1 tablet (40 mg total) by mouth daily. 30 tablet 0   polyethylene glycol powder (GLYCOLAX/MIRALAX) 17 GM/SCOOP powder Take 17 g by mouth daily as needed. 225 g 0   predniSONE (DELTASONE) 5 MG tablet Take 5 mg by mouth daily.     RHOPRESSA 0.02 % SOLN Ophthalmic for 56 Days     RHOPRESSA 0.02 % SOLN Apply 1 drop to eye at bedtime.     traZODone (DESYREL) 50 MG tablet Oral for 30 Days     Vitamin D, Ergocalciferol, (DRISDOL) 1.25 MG (50000 UNIT) CAPS capsule Take 1 capsule (50,000 Units total) by mouth every 7 (seven) days. (Patient  taking differently: Take 50,000 Units by mouth every Wednesday.) 12 capsule 0   WIXELA INHUB 250-50 MCG/ACT AEPB INHALE 1 PUFF BY MOUTH TWICE A DAY (Patient taking differently: Inhale 1 puff into the lungs in the morning and at bedtime.) 120 each 1   XARELTO 10 MG TABS tablet Oral for 30 Days     DULoxetine (CYMBALTA) 30 MG capsule Take 1 capsule (30 mg total) by mouth daily. 30 capsule 1   Current Facility-Administered Medications  Medication Dose Route Frequency Provider Last Rate Last Admin   cyanocobalamin (VITAMIN B12) injection 1,000 mcg  1,000 mcg Intramuscular Once         PAST MEDICAL HISTORY: Past Medical History:  Diagnosis Date   Acute bronchitis    Allergy    Anemia    Anxiety    Arthritis    knees   Asthma    Back pain    Backache, unspecified    Cataract    removed both eyes   Depression    Diverticulitis    Diverticulosis of colon (without mention of hemorrhage)    Edema    GERD (gastroesophageal reflux disease)    Glaucoma    Joint pain    Lactose intolerance    Lumbago    Neuropathy    Obesity    Osteoarthritis    Other chest pain    Pain in joint, ankle and foot     Rheumatoid arthritis (HCC)    Sarcoidosis    SOB (shortness of breath)    Tibialis tendinitis    Unspecified asthma(493.90)     PAST SURGICAL HISTORY: Past Surgical History:  Procedure Laterality Date   ABDOMINAL HYSTERECTOMY     CATARACT EXTRACTION W/PHACO Right 06/24/2014   Procedure: CATARACT EXTRACTION PHACO AND INTRAOCULAR LENS PLACEMENT (IOC) RIGHT EYE WITH GONIOSYNECHIALYSIS;  Surgeon: Chalmers Guest, MD;  Location: [facility] OR;  Service: Ophthalmology;  Laterality: Right;   COLONOSCOPY     EYE SURGERY Left    cataract surgery   HAND SURGERY Right    trigger finger release   IR RADIOLOGIST EVAL & MGMT  11/14/2018   KNEE ARTHROSCOPY Right    TOTAL KNEE ARTHROPLASTY Right 12/08/2021   Procedure: RIGHT TOTAL KNEE ARTHROPLASTY APPLICATION OF WOUND VAC;  Surgeon: Cammy Copa, MD;  Location: [facility] OR;  Service: Orthopedics;  Laterality: Right;   TOTAL KNEE ARTHROPLASTY Left 04/13/2022   Procedure: LEFT TOTAL KNEE ARTHROPLASTY;  Surgeon: Cammy Copa, MD;  Location: [facility] OR;  Service: Orthopedics;  Laterality: Left;    FAMILY HISTORY: Family History  Problem Relation Age of Onset   Hypertension Mother    Stroke Mother    Depression Mother    Anxiety disorder Mother    Obesity Mother    Diabetes Father    Sarcoidosis Sister    Colon cancer Neg Hx    Colon polyps Neg Hx    Esophageal cancer Neg Hx    Rectal cancer Neg Hx    Stomach cancer Neg Hx     SOCIAL HISTORY: Social History   Socioeconomic History   Marital status: Widowed    Spouse name: Not on file   Number of children: 0   Years of education: Not on file   Highest education level: Professional school degree (e.g., MD, DDS, DVM, JD)  Occupational History   Occupation: Attorney (semi-retired)  Tobacco Use   Smoking status: Never   Smokeless tobacco: Never  Vaping Use   Vaping status: Never Used  Substance and  Sexual Activity   Alcohol use: Yes    Comment: 1 glass of wine or mixed drink once a week    Drug use: No   Sexual activity: Not on file  Other Topics Concern   Not on file  Social History Narrative   Not on file   Social Determinants of Health   Financial Resource Strain: Low Risk  (01/14/2023)   Overall Financial Resource Strain (CARDIA)    Difficulty of Paying Living Expenses: Not very hard  Food Insecurity: No Food Insecurity (01/14/2023)   Hunger Vital Sign    Worried About Running Out of Food in the Last Year: Never true    Ran Out of Food in the Last Year: Never true  Transportation Needs: Unmet Transportation Needs (01/14/2023)   PRAPARE - Transportation    Lack of Transportation (Medical): Yes    Lack of Transportation (Non-Medical): Yes  Physical Activity: Insufficiently Active (01/14/2023)   Exercise Vital Sign    Days of Exercise per Week: 3 days    Minutes of Exercise per Session: 40 min  Stress: Stress Concern Present (01/14/2023)   [employer] of Occupational Health - Occupational Stress Questionnaire    Feeling of Stress : To some extent  Social Connections: Moderately Integrated (01/14/2023)   Social Connection and Isolation Panel [NHANES]    Frequency of Communication with Friends and Family: More than three times a week    Frequency of Social Gatherings with Friends and Family: Three times a week    Attends Religious Services: More than 4 times per year    Active Member of Clubs or Organizations: Yes    Attends Banker Meetings: Patient declined    Marital Status: Widowed  Intimate Partner Violence: Not At Risk (11/29/2022)   Humiliation, Afraid, Rape, and Kick questionnaire    Fear of Current or Ex-Partner: No    Emotionally Abused: No    Physically Abused: No    Sexually Abused: No      Levert Feinstein, M.D. Ph.D.  [facility] [address], [address] [place], [place] [address] Ph: [phone] Fax: [phone]  CC:  Juanda Chance, NP [address] [place],  [place] [address]  Deeann Saint, MD

## 2023-07-14 DIAGNOSIS — M542 Cervicalgia: Secondary | ICD-10-CM | POA: Insufficient documentation

## 2023-07-14 DIAGNOSIS — M5481 Occipital neuralgia: Secondary | ICD-10-CM | POA: Insufficient documentation

## 2023-07-14 NOTE — Progress Notes (Signed)
   History: Persistent left upper nuchal area pain, radiating pain to left occipital region  Left occipital nerve block, and trigger point injection for left nuchal area  Bupivacaine 0.5% 1.5 cc was mixed with 1.5 cc of dexamethasone (6 mg/cc)  The injection was placed at left occipital nuchal area where she has most tenderness,  Injections were made with a 27-gauge needle.  She tolerated injection well, noticed immediate improvement afterwards

## 2023-07-17 DIAGNOSIS — Z6841 Body Mass Index (BMI) 40.0 and over, adult: Secondary | ICD-10-CM | POA: Diagnosis not present

## 2023-07-17 DIAGNOSIS — D649 Anemia, unspecified: Secondary | ICD-10-CM | POA: Diagnosis not present

## 2023-07-17 DIAGNOSIS — J45909 Unspecified asthma, uncomplicated: Secondary | ICD-10-CM | POA: Diagnosis not present

## 2023-07-17 DIAGNOSIS — M19022 Primary osteoarthritis, left elbow: Secondary | ICD-10-CM | POA: Diagnosis not present

## 2023-07-17 DIAGNOSIS — Z7982 Long term (current) use of aspirin: Secondary | ICD-10-CM | POA: Diagnosis not present

## 2023-07-17 DIAGNOSIS — M542 Cervicalgia: Secondary | ICD-10-CM | POA: Diagnosis not present

## 2023-07-17 DIAGNOSIS — M069 Rheumatoid arthritis, unspecified: Secondary | ICD-10-CM | POA: Diagnosis not present

## 2023-07-17 DIAGNOSIS — M810 Age-related osteoporosis without current pathological fracture: Secondary | ICD-10-CM | POA: Diagnosis not present

## 2023-07-18 ENCOUNTER — Other Ambulatory Visit: Payer: Self-pay | Admitting: Family Medicine

## 2023-07-18 DIAGNOSIS — D649 Anemia, unspecified: Secondary | ICD-10-CM | POA: Diagnosis not present

## 2023-07-18 DIAGNOSIS — M810 Age-related osteoporosis without current pathological fracture: Secondary | ICD-10-CM | POA: Diagnosis not present

## 2023-07-18 DIAGNOSIS — J3089 Other allergic rhinitis: Secondary | ICD-10-CM

## 2023-07-18 DIAGNOSIS — J45909 Unspecified asthma, uncomplicated: Secondary | ICD-10-CM | POA: Diagnosis not present

## 2023-07-18 DIAGNOSIS — Z7982 Long term (current) use of aspirin: Secondary | ICD-10-CM | POA: Diagnosis not present

## 2023-07-18 DIAGNOSIS — Z6841 Body Mass Index (BMI) 40.0 and over, adult: Secondary | ICD-10-CM | POA: Diagnosis not present

## 2023-07-18 DIAGNOSIS — M19022 Primary osteoarthritis, left elbow: Secondary | ICD-10-CM | POA: Diagnosis not present

## 2023-07-18 DIAGNOSIS — M542 Cervicalgia: Secondary | ICD-10-CM | POA: Diagnosis not present

## 2023-07-18 DIAGNOSIS — M069 Rheumatoid arthritis, unspecified: Secondary | ICD-10-CM | POA: Diagnosis not present

## 2023-07-25 ENCOUNTER — Ambulatory Visit: Payer: Medicare PPO

## 2023-07-25 VITALS — Ht 61.5 in | Wt 197.0 lb

## 2023-07-25 DIAGNOSIS — Z0000 Encounter for general adult medical examination without abnormal findings: Secondary | ICD-10-CM

## 2023-07-25 NOTE — Patient Instructions (Addendum)
[name] , Thank you for taking time to come for your Medicare Wellness Visit. I appreciate your ongoing commitment to your health goals. Please review the following plan we discussed and let me know if I can assist you in the future.   Referrals/Orders/Follow-Ups/Clinician Recommendations:   This is a list of the screening recommended for you and due dates:  Health Maintenance  Topic Date Due   Mammogram  01/06/2023   COVID-19 Vaccine (6 - 2023-24 season) 11/06/2023   Medicare Annual Wellness Visit  07/24/2024   DTaP/Tdap/Td vaccine (2 - Td or Tdap) 09/11/2026   Colon Cancer Screening  10/02/2029   Pneumonia Vaccine  Completed   Flu Shot  Completed   DEXA scan (bone density measurement)  Completed   Hepatitis C Screening  Completed   Zoster (Shingles) Vaccine  Completed   HPV Vaccine  Aged Out    Advanced directives: (Copy Requested) Please bring a copy of your health care power of attorney and living will to the office to be added to your chart at your convenience.  Next Medicare Annual Wellness Visit scheduled for next year: Yes

## 2023-07-25 NOTE — Progress Notes (Signed)
Subjective:   [name] is a 72 y.o. female who presents for Medicare Annual (Subsequent) preventive examination.  Visit Complete: Virtual I connected with  [name] on 07/25/23 by a audio enabled telemedicine application and verified that I am speaking with the correct person using two identifiers.   Patient Location: Home  Provider Location: Home Office  I discussed the limitations of evaluation and management by telemedicine. The patient expressed understanding and agreed to proceed.  Vital Signs: Because this visit was a virtual/telehealth visit, some criteria may be missing or patient reported. Any vitals not documented were not able to be obtained and vitals that have been documented are patient reported.  Patient Medicare AWV questionnaire was completed by the patient on 07/24/23; I have confirmed that all information answered by patient is correct and no changes since this date.  Cardiac Risk Factors include: advanced age (>35men, >7 women)     Objective:    Today's Vitals   07/25/23 0917  Weight: 197 lb (89.4 kg)  Height: 5' 1.5" (1.562 m)   Body mass index is 36.62 kg/m.     07/25/2023    9:39 AM 05/03/2023   12:29 PM 11/29/2022   11:58 AM 11/08/2022   11:18 PM 07/20/2022    3:09 PM 04/14/2022   12:13 AM 04/10/2022    2:17 PM  Advanced Directives  Does Patient Have a Medical Advance Directive? Yes No Yes No Yes Yes Yes  Type of Estate agent of [place];Living will  Healthcare Power of [place];Living will  Healthcare Power of [place];Living will Out of facility DNR (pink MOST or yellow form);Living will;Healthcare Power of [employer] of [place];Living will  Does patient want to make changes to medical advance directive?   No - Patient declined   No - Patient declined No - Patient declined  Copy of Healthcare Power of Attorney in Chart? No - copy requested  No - copy requested  No - copy requested No - copy  requested Yes - validated most recent copy scanned in chart (See row information)  Would patient like information on creating a medical advance directive?   No - Patient declined No - Patient declined       Current Medications (verified) Outpatient Encounter Medications as of 07/25/2023  Medication Sig   acetaminophen (TYLENOL) 500 MG tablet Take 1,000 mg by mouth in the morning and at bedtime.   albuterol (VENTOLIN HFA) 108 (90 Base) MCG/ACT inhaler TAKE 2 PUFFS BY MOUTH EVERY 6 HOURS AS NEEDED (Patient taking differently: Inhale 2 puffs into the lungs every 6 (six) hours as needed for wheezing or shortness of breath. TAKE 2 PUFFS BY MOUTH EVERY 6 HOURS AS NEEDED)   alendronate (FOSAMAX) 70 MG tablet Take 70 mg by mouth every Monday.   amLODipine (NORVASC) 5 MG tablet Take 1 tablet (5 mg total) by mouth daily.   amoxicillin (AMOXIL) 500 MG capsule Take 2,000 mg by mouth See admin instructions. Take 2000mg  by mouth 1 hour before appt.   aspirin EC 325 MG tablet Take 325 mg by mouth daily.   Blood Pressure Monitor MISC 1 Device by Does not apply route daily.   Cholecalciferol (VITAMIN D3) 50 MCG (2000 UT) capsule    cyanocobalamin (VITAMIN B12) 1000 MCG/ML injection Inject 1,000 mcg into the muscle every 30 (thirty) days.   cycloSPORINE (RESTASIS) 0.05 % ophthalmic emulsion Place 1 drop into both eyes 2 (two) times daily.   DULoxetine (CYMBALTA) 30 MG capsule  Take 1 capsule (30 mg total) by mouth daily.   fexofenadine-pseudoephedrine (ALLEGRA-D ALLERGY & CONGESTION) 60-120 MG 12 hr tablet Take 1 tablet by mouth every morning.   fluticasone (FLONASE) 50 MCG/ACT nasal spray Place 1 spray into both nostrils daily.   folic acid (FOLVITE) 1 MG tablet Take 1 tablet by mouth once a day   gabapentin (NEURONTIN) 300 MG capsule Take 1 capsule (300 mg total) by mouth 2 (two) times daily.   IYUZEH 0.005 % SOLN SMARTSIG:1 Drop(s) In Eye(s) Every Evening   KEVZARA 200 MG/1. SOAJ Inject into the skin.    Lactase (LACTAID FAST ACT) 9000 units CHEW Chew by mouth. Dosage unknown   leflunomide (ARAVA) 20 MG tablet Take 20 mg by mouth daily.   levocetirizine (XYZAL) 5 MG tablet TAKE ONE TABLET BY MOUTH EVERY IN THE EVENING   LUMIGAN 0.01 % SOLN SMARTSIG:1 Drop(s) In Eye(s) Every Evening   meloxicam (MOBIC) 15 MG tablet Take 1 tablet (15 mg total) by mouth daily.   methotrexate (RHEUMATREX) 2.5 MG tablet Take 20 mg by mouth every Wednesday.   Multiple Vitamins-Minerals (MULTIVITAMIN) tablet Take 1 tablet by mouth daily.   pantoprazole (PROTONIX) 40 MG tablet Take 1 tablet (40 mg total) by mouth daily.   polyethylene glycol powder (GLYCOLAX/MIRALAX) 17 GM/SCOOP powder Take 17 g by mouth daily as needed.   predniSONE (DELTASONE) 5 MG tablet Take 5 mg by mouth daily.   RHOPRESSA 0.02 % SOLN Ophthalmic for 56 Days   RHOPRESSA 0.02 % SOLN Apply 1 drop to eye at bedtime.   traZODone (DESYREL) 50 MG tablet Oral for 30 Days   Vitamin D, Ergocalciferol, (DRISDOL) 1.25 MG (50000 UNIT) CAPS capsule Take 1 capsule (50,000 Units total) by mouth every 7 (seven) days. (Patient taking differently: Take 50,000 Units by mouth every Wednesday.)   WIXELA INHUB 250-50 MCG/ACT AEPB INHALE 1 PUFF BY MOUTH TWICE A DAY (Patient taking differently: Inhale 1 puff into the lungs in the morning and at bedtime.)   XARELTO 10 MG TABS tablet Oral for 30 Days   Facility-Administered Encounter Medications as of 07/25/2023  Medication   cyanocobalamin (VITAMIN B12) injection 1,000 mcg    Allergies (verified) Invanz [ertapenem], Ibuprofen, and Sulfa antibiotics   History: Past Medical History:  Diagnosis Date   Acute bronchitis    Allergy    Anemia    Anxiety    Arthritis    knees   Asthma    Back pain    Backache, unspecified    Cataract    removed both eyes   Depression    Diverticulitis    Diverticulosis of colon (without mention of hemorrhage)    Edema    GERD (gastroesophageal reflux disease)    Glaucoma     Joint pain    Lactose intolerance    Lumbago    Neuropathy    Obesity    Osteoarthritis    Other chest pain    Pain in joint, ankle and foot    Rheumatoid arthritis (HCC)    Sarcoidosis    SOB (shortness of breath)    Tibialis tendinitis    Unspecified asthma(493.90)    Past Surgical History:  Procedure Laterality Date   ABDOMINAL HYSTERECTOMY     CATARACT EXTRACTION W/PHACO Right 06/24/2014   Procedure: CATARACT EXTRACTION PHACO AND INTRAOCULAR LENS PLACEMENT (IOC) RIGHT EYE WITH GONIOSYNECHIALYSIS;  Surgeon: Chalmers Guest, MD;  Location: [facility] OR;  Service: Ophthalmology;  Laterality: Right;   COLONOSCOPY     EYE  SURGERY Left    cataract surgery   HAND SURGERY Right    trigger finger release   IR RADIOLOGIST EVAL & MGMT  11/14/2018   KNEE ARTHROSCOPY Right    TOTAL KNEE ARTHROPLASTY Right 12/08/2021   Procedure: RIGHT TOTAL KNEE ARTHROPLASTY APPLICATION OF WOUND VAC;  Surgeon: Cammy Copa, MD;  Location: [facility] OR;  Service: Orthopedics;  Laterality: Right;   TOTAL KNEE ARTHROPLASTY Left 04/13/2022   Procedure: LEFT TOTAL KNEE ARTHROPLASTY;  Surgeon: Cammy Copa, MD;  Location: [facility] OR;  Service: Orthopedics;  Laterality: Left;   Family History  Problem Relation Age of Onset   Hypertension Mother    Stroke Mother    Depression Mother    Anxiety disorder Mother    Obesity Mother    Diabetes Father    Sarcoidosis Sister    Colon cancer Neg Hx    Colon polyps Neg Hx    Esophageal cancer Neg Hx    Rectal cancer Neg Hx    Stomach cancer Neg Hx    Social History   Socioeconomic History   Marital status: Widowed    Spouse name: Not on file   Number of children: 0   Years of education: Not on file   Highest education level: Professional school degree (e.g., MD, DDS, DVM, JD)  Occupational History   Occupation: Pensions consultant (semi-retired)  Tobacco Use   Smoking status: Never   Smokeless tobacco: Never  Vaping Use   Vaping status: Never Used  Substance and Sexual  Activity   Alcohol use: Yes    Comment: 1 glass of wine or mixed drink once a week   Drug use: No   Sexual activity: Not on file  Other Topics Concern   Not on file  Social History Narrative   Not on file   Social Determinants of Health   Financial Resource Strain: Low Risk  (07/25/2023)   Overall Financial Resource Strain (CARDIA)    Difficulty of Paying Living Expenses: Not hard at all  Food Insecurity: No Food Insecurity (07/24/2023)   Hunger Vital Sign    Worried About Running Out of Food in the Last Year: Never true    Ran Out of Food in the Last Year: Never true  Transportation Needs: Unmet Transportation Needs (07/24/2023)   PRAPARE - Transportation    Lack of Transportation (Medical): Yes    Lack of Transportation (Non-Medical): Yes  Physical Activity: Insufficiently Active (07/24/2023)   Exercise Vital Sign    Days of Exercise per Week: 3 days    Minutes of Exercise per Session: 20 min  Stress: Stress Concern Present (07/24/2023)   [employer] of Occupational Health - Occupational Stress Questionnaire    Feeling of Stress : To some extent  Social Connections: Moderately Integrated (07/24/2023)   Social Connection and Isolation Panel [NHANES]    Frequency of Communication with Friends and Family: Three times a week    Frequency of Social Gatherings with Friends and Family: Once a week    Attends Religious Services: More than 4 times per year    Active Member of [employer] or Organizations: Yes    Attends Banker Meetings: 1 to 4 times per year    Marital Status: Widowed    Tobacco Counseling Counseling given: Not Answered   Clinical Intake:  Pre-visit preparation completed: Yes  Pain : No/denies pain     BMI - recorded: 36.62 Nutritional Status: BMI > 30  Obese Nutritional Risks: None Diabetes: No  How  often do you need to have someone help you when you read instructions, pamphlets, or other written materials from your doctor or  pharmacy?: 1 - Never  Interpreter Needed?: No  Information entered by :: Victoria Mulligan LPN   Activities of Daily Living    07/24/2023   11:02 PM 11/29/2022   11:58 AM  In your present state of health, do you have any difficulty performing the following activities:  Hearing? 1 1  Comment Wears hearing aide rt ear   Vision? 0 0  Difficulty concentrating or making decisions? 0 0  Walking or climbing stairs? 1 1  Comment Uses [employer] or bathing? 1 0  Comment Need some assist with washing back   Doing errands, shopping? 0 1  Preparing Food and eating ? N   Using the Toilet? N   In the past six months, have you accidently leaked urine? Y   Comment Wears breifs. Followed by GYN   Do you have problems with loss of bowel control? Y   Comment Wears breifs. Followed by GYN   Managing your Medications? N   Managing your Finances? N   Housekeeping or managing your Housekeeping? N     Patient Care Team: Deeann Saint, MD as PCP - General (Family Medicine)  Indicate any recent Medical Services you may have received from other than [facility] providers in the past year (date may be approximate).     Assessment:   This is a routine wellness examination for [name].  Hearing/Vision screen Hearing Screening - Comments:: Wears hearing aide in rt ear. Vision Screening - Comments:: Wears rx glasses - up to date with routine eye exams with  Dr Clare Gandy   Goals Addressed               This Visit's Progress     Increase physical activity (pt-stated)         Depression Screen    07/25/2023    9:26 AM 07/06/2023    3:03 PM 05/07/2023    3:05 PM 02/15/2023    1:46 PM 02/15/2023    1:22 PM 07/20/2022    2:54 PM 06/26/2022   10:46 AM  PHQ 2/9 Scores  PHQ - 2 Score 0 1 1 2  0 2   PHQ- 9 Score   7 7 2 4    Exception Documentation       Patient refusal    Fall Risk    07/25/2023    9:46 AM 07/24/2023   11:02 PM 07/06/2023    3:03 PM 05/07/2023    3:05 PM  02/15/2023    1:45 PM  Fall Risk   Falls in the past year? 0 0 0 1 1  Number falls in past yr: 0 0 0 0 0  Injury with Fall? 0 0 0 0 0  Risk for fall due to : No Fall Risks No Fall Risks No Fall Risks No Fall Risks Other (Comment)  Follow up Falls prevention discussed Falls prevention discussed Falls evaluation completed Falls evaluation completed Falls evaluation completed    MEDICARE RISK AT HOME: Medicare Risk at Home Any stairs in or around the home?: Yes If so, are there any without handrails?: No Home free of loose throw rugs in walkways, pet beds, electrical cords, etc?: Yes Adequate lighting in your home to reduce risk of falls?: Yes Life alert?: No Use of a cane, walker or w/c?: Yes Grab bars in the bathroom?: Yes Shower chair or  bench in shower?: Yes Elevated toilet seat or a handicapped toilet?: Yes  TIMED UP AND GO:  Was the test performed?  No    Cognitive Function:      08/28/2021    1:00 PM  Montreal Cognitive Assessment   Visuospatial/ Executive (0/5) 4  Naming (0/3) 3  Attention: Read list of digits (0/2) 2  Attention: Read list of letters (0/1) 1  Attention: Serial 7 subtraction starting at 100 (0/3) 3  Language: Repeat phrase (0/2) 2  Language : Fluency (0/1) 1  Abstraction (0/2) 2  Delayed Recall (0/5) 4  Orientation (0/6) 6  Total 28  Adjusted Score (based on education) 28      07/25/2023    9:40 AM 07/20/2022    3:09 PM 01/19/2021    8:23 AM  6CIT Screen  What Year? 0 points 0 points 0 points  What month? 0 points 0 points 0 points  What time? 0 points 0 points   Count back from 20 0 points 0 points 0 points  Months in reverse 0 points 0 points 0 points  Repeat phrase 0 points 0 points 0 points  Total Score 0 points 0 points     Immunizations Immunization History  Administered Date(s) Administered   Fluad Quad(high Dose 65+) 05/26/2021, 06/26/2022   Fluad Trivalent(High Dose 65+) 07/06/2023   Influenza Split 07/17/2011, 06/24/2012    Influenza Whole 07/11/2007, 07/22/2008, 07/14/2009, 06/29/2010   Influenza, High Dose Seasonal PF 07/03/2017, 06/05/2018, 07/25/2019, 05/17/2020   Influenza,inj,Quad PF,6+ Mos 06/19/2013, 06/12/2014, 06/14/2015, 07/25/2016   Influenza-Unspecified 07/01/2015, 07/02/2016, 05/31/2020   PFIZER Comirnaty(Gray Top)Covid-19 Tri-Sucrose Vaccine 12/31/2020   PFIZER(Purple Top)SARS-COV-2 Vaccination 10/21/2019, 11/10/2019, 07/03/2020   Pfizer(Comirnaty)Fall Seasonal Vaccine 12 years and older 07/06/2023   Pneumococcal Conjugate-13 09/11/2016   Pneumococcal Polysaccharide-23 05/31/2020   Tdap 09/11/2016   Zoster Recombinant(Shingrix) 07/25/2019, 04/12/2020   Zoster, Live 06/23/2015    TDAP status: Up to date  Flu Vaccine status: Up to date  Pneumococcal vaccine status: Up to date  Covid-19 vaccine status: Declined, Education has been provided regarding the importance of this vaccine but patient still declined. Advised may receive this vaccine at local pharmacy or Health Dept.or vaccine clinic. Aware to provide a copy of the vaccination record if obtained from local pharmacy or Health Dept. Verbalized acceptance and understanding.  Qualifies for Shingles Vaccine? Yes   Zostavax completed Yes   Shingrix Completed?: Yes  Screening Tests Health Maintenance  Topic Date Due   MAMMOGRAM  01/06/2023   COVID-19 Vaccine (6 - 2023-24 season) 11/06/2023   Medicare Annual Wellness (AWV)  07/24/2024   DTaP/Tdap/Td (2 - Td or Tdap) 09/11/2026   Colonoscopy  10/02/2029   Pneumonia Vaccine 33+ Years old  Completed   INFLUENZA VACCINE  Completed   DEXA SCAN  Completed   Hepatitis C Screening  Completed   Zoster Vaccines- Shingrix  Completed   HPV VACCINES  Aged Out    Health Maintenance  Health Maintenance Due  Topic Date Due   MAMMOGRAM  01/06/2023    Colorectal cancer screening: Type of screening: Colonoscopy. Completed 10/03/19. Repeat every 10 years  Mammogram status: Ordered Deferred. Pt  provided with contact info and advised to call to schedule appt.   Bone Density status: Completed 03/22/12. Results reflect: Bone density results: OSTEOPOROSIS. Repeat every   years.    Additional Screening:  Hepatitis C Screening: does qualify; Completed 02/06/18  Vision Screening: Recommended annual ophthalmology exams for early detection of glaucoma and other disorders of the  eye. Is the patient up to date with their annual eye exam?  Yes  Who is the provider or what is the name of the office in which the patient attends annual eye exams? Dr Clare Gandy If pt is not established with a provider, would they like to be referred to a provider to establish care? No .   Dental Screening: Recommended annual dental exams for proper oral hygiene    Community Resource Referral / Chronic Care Management:  CRR required this visit?  No   CCM required this visit?  No     Plan:     I have personally reviewed and noted the following in the patient's chart:   Medical and social history Use of alcohol, tobacco or illicit drugs  Current medications and supplements including opioid prescriptions. Patient is not currently taking opioid prescriptions. Functional ability and status Nutritional status Physical activity Advanced directives List of other physicians Hospitalizations, surgeries, and ER visits in previous 12 months Vitals Screenings to include cognitive, depression, and falls Referrals and appointments  In addition, I have reviewed and discussed with patient certain preventive protocols, quality metrics, and best practice recommendations. A written personalized care plan for preventive services as well as general preventive health recommendations were provided to patient.     Tillie Rung, LPN   [id number]/07/[id number]   After Visit Summary: (MyChart) Due to this being a telephonic visit, the after visit summary with patients personalized plan was offered to patient via MyChart   Nurse  Notes: None

## 2023-07-26 ENCOUNTER — Encounter (INDEPENDENT_AMBULATORY_CARE_PROVIDER_SITE_OTHER): Payer: Self-pay | Admitting: Internal Medicine

## 2023-07-26 ENCOUNTER — Ambulatory Visit (INDEPENDENT_AMBULATORY_CARE_PROVIDER_SITE_OTHER): Payer: Medicare PPO | Admitting: Internal Medicine

## 2023-07-26 VITALS — BP 138/84 | HR 99 | Temp 98.0°F | Ht <= 58 in | Wt 200.0 lb

## 2023-07-26 DIAGNOSIS — R635 Abnormal weight gain: Secondary | ICD-10-CM

## 2023-07-26 DIAGNOSIS — M059 Rheumatoid arthritis with rheumatoid factor, unspecified: Secondary | ICD-10-CM | POA: Diagnosis not present

## 2023-07-26 DIAGNOSIS — R29818 Other symptoms and signs involving the nervous system: Secondary | ICD-10-CM

## 2023-07-26 DIAGNOSIS — M069 Rheumatoid arthritis, unspecified: Secondary | ICD-10-CM | POA: Diagnosis not present

## 2023-07-26 DIAGNOSIS — M542 Cervicalgia: Secondary | ICD-10-CM | POA: Diagnosis not present

## 2023-07-26 DIAGNOSIS — Z7982 Long term (current) use of aspirin: Secondary | ICD-10-CM | POA: Diagnosis not present

## 2023-07-26 DIAGNOSIS — T50905A Adverse effect of unspecified drugs, medicaments and biological substances, initial encounter: Secondary | ICD-10-CM | POA: Diagnosis not present

## 2023-07-26 DIAGNOSIS — Z6841 Body Mass Index (BMI) 40.0 and over, adult: Secondary | ICD-10-CM

## 2023-07-26 DIAGNOSIS — E66813 Obesity, class 3: Secondary | ICD-10-CM

## 2023-07-26 DIAGNOSIS — J45909 Unspecified asthma, uncomplicated: Secondary | ICD-10-CM | POA: Diagnosis not present

## 2023-07-26 DIAGNOSIS — Z7985 Long-term (current) use of injectable non-insulin antidiabetic drugs: Secondary | ICD-10-CM | POA: Diagnosis not present

## 2023-07-26 DIAGNOSIS — D649 Anemia, unspecified: Secondary | ICD-10-CM | POA: Diagnosis not present

## 2023-07-26 DIAGNOSIS — M810 Age-related osteoporosis without current pathological fracture: Secondary | ICD-10-CM | POA: Diagnosis not present

## 2023-07-26 DIAGNOSIS — R7303 Prediabetes: Secondary | ICD-10-CM | POA: Diagnosis not present

## 2023-07-26 DIAGNOSIS — M19022 Primary osteoarthritis, left elbow: Secondary | ICD-10-CM | POA: Diagnosis not present

## 2023-07-26 MED ORDER — OZEMPIC (0.25 OR 0.5 MG/DOSE) 2 MG/3ML ~~LOC~~ SOPN
0.2500 mg | PEN_INJECTOR | SUBCUTANEOUS | 0 refills | Status: DC
Start: 2023-07-26 — End: 2023-08-21

## 2023-07-26 NOTE — Assessment & Plan Note (Signed)
Most recent A1c is  Lab Results  Component Value Date   HGBA1C 5.9 (H) 05/22/2023   HGBA1C 5.6 09/01/2020    Patient aware of disease state and risk of progression. This may contribute to abnormal cravings, fatigue and diabetic complications without having diabetes.   We have discussed treatment options which include: losing 7 to 10% of body weight, increasing physical activity to a goal of 150 minutes a week at moderate intensity.  Advised to maintain a diet low on simple and processed carbohydrates.  Patient has had progressive weight gain, she is also on obesogenic medications which increase her risk of diabetes progression.  She will benefit from pharmacoprophylaxis but because of chronic loose stools metformin is not an option.  We discussed the role of GLP-1 therapy and diabetes prevention and she is agreeable to starting semaglutide 0.25 mg once a week for pharmacoprophylaxis and weight management

## 2023-07-26 NOTE — Assessment & Plan Note (Signed)
Patient on antiepileptics, steroids and immunomodulators.  She is not a good candidate for metformin because of chronic loose stools.  We discussed the role of GLP-1 therapy and she would like to try semaglutide to offset some of the weight gain associated with her medications also for management of her prediabetes and obesity.

## 2023-07-26 NOTE — Assessment & Plan Note (Addendum)
Patient has symptoms of sleep disordered breathing and high risk phenotype. Counseled on risks associated with undiagnosed / untreated disorder. Recommend she have PSMG. Losing 15% of BW may improve symptoms.    I had referred for polysomnography and appointment is scheduled next month for consultation.

## 2023-07-26 NOTE — Assessment & Plan Note (Signed)
Patient is on immunomodulating drugs some of them may cause weight gain and make weight loss difficult.  Patient also has fatigue which reduces her physical activity and mobility.  She would benefit from antiobesity medication to offset weight gain associated with medical therapy.

## 2023-07-26 NOTE — Assessment & Plan Note (Signed)
 See obesity treatment plan

## 2023-07-26 NOTE — Progress Notes (Signed)
Office: [phone]  /  Fax: [phone]  WEIGHT SUMMARY AND BIOMETRICS  Vitals Temp: 98 F (36.7 C) BP: 138/84 Pulse Rate: 99 SpO2: 96 %   Anthropometric Measurements Height: 4\' 10"  (1.473 m) Weight: 200 lb (90.7 kg) BMI (Calculated): 41.81 Weight at Last Visit: 197 lb Weight Lost Since Last Visit: 0 lb Weight Gained Since Last Visit: 3 lb Starting Weight: 202 lb Total Weight Loss (lbs): 2 lb (0.907 kg) Peak Weight: 220 lb   Body Composition  Body Fat %: 53.5 % Fat Mass (lbs): 107.4 lbs Muscle Mass (lbs): 88.6 lbs Total Body Water (lbs): 0 lbs Visceral Fat Rating : 19    RMR: 1267  Today's Visit #: 3  Starting Date: 05/22/23   HPI  Chief Complaint: OBESITY  [name] is here to discuss her progress with her obesity treatment plan. She is on the the Category 1 Plan and states she is following her eating plan approximately 50 % of the time. She states she is exercising 50 minutes 3 times per week.  Interval History:  Since last office visit she has gained 3 pounds. She reports has not implemented reduced calorie nutritional plan.  She does not cook and prefers prepackaged foods.  She relies on other people for some of her meals but denies eating takeout frequently.  She acknowledges snacking at times on highly palatable snacks.  She does 2 meals a day denies having problems with access to food.  Orexigenic Control: Denies problems with appetite and hunger signals.  Denies problems with satiety and satiation.  Denies problems with eating patterns and portion control.  Denies abnormal cravings. Denies feeling deprived or restricted.   Barriers identified: having difficulty preparing healthy meals, having difficulty with meal prep and planning, need for convenience or prepackaged foods, limited food variation or intolerances, low volume of physical activity at present , orthopedic problems, medical conditions or chronic pain affecting mobility, medical  comorbidities, and difficulty implementing reduced calorie nutrition plan.   Pharmacotherapy for weight loss: She is currently taking no anti-obesity medication but expressed interest in weight loss medications as semaglutide.   ASSESSMENT AND PLAN  TREATMENT PLAN FOR OBESITY:  Recommended Dietary Goals  [name] is currently in the action stage of change. As such, her goal is to continue weight management plan. She has agreed to: continue to work on implementation of reduced calorie nutrition plan ([facility])  Behavioral Intervention  We discussed the following Behavioral Modification Strategies today: increasing lean protein intake to established goals, decreasing simple carbohydrates , increasing vegetables, increasing lower glycemic fruits, increasing fiber rich foods, avoiding skipping meals, increasing water intake , work on meal planning and preparation, and continue to work on implementation of reduced calorie nutritional plan.  Additional resources provided today: None  Recommended Physical Activity Goals  [name] has been advised to work up to 150 minutes of moderate intensity aerobic activity a week and strengthening exercises 2-3 times per week for cardiovascular health, weight loss maintenance and preservation of muscle mass.   She has agreed to :  Think about enjoyable ways to increase daily physical activity and overcoming barriers to exercise and Increase physical activity in their day and reduce sedentary time (increase NEAT).  Pharmacotherapy We discussed various medication options to help [name] with her weight loss efforts and we both agreed to :  She will be prescribed semaglutide with the primary indication of diabetes prevention.  She has prediabetes but has chronic loose stools and therefore is not a good candidate for  metformin.  She is also on several obesogenic medications which will make weight loss very difficult for her.  This medication may help offset some of the  weight gain associated with her prednisone and gabapentin.  She is also on Biologics.  ASSOCIATED CONDITIONS ADDRESSED TODAY  Rheumatoid arthritis with positive rheumatoid factor, involving unspecified site [facility]) Assessment & Plan: Patient is on immunomodulating drugs some of them may cause weight gain and make weight loss difficult.  Patient also has fatigue which reduces her physical activity and mobility.  She would benefit from antiobesity medication to offset weight gain associated with medical therapy.  Orders: -     Ozempic (0.25 or 0.5 MG/DOSE); Inject 0.25 mg into the skin once a week.  Dispense: 3 mL; Refill: 0  Suspected sleep apnea Assessment & Plan: Patient has symptoms of sleep disordered breathing and high risk phenotype. Counseled on risks associated with undiagnosed / untreated disorder. Recommend she have PSMG. Losing 15% of BW may improve symptoms.    I had referred for polysomnography and appointment is scheduled next month for consultation.   Orders: -     Ozempic (0.25 or 0.5 MG/DOSE); Inject 0.25 mg into the skin once a week.  Dispense: 3 mL; Refill: 0  Class 3 severe obesity with serious comorbidity and body mass index (BMI) of 40.0 to 44.9 in adult, unspecified obesity type (HCC) Assessment & Plan: See obesity treatment plan  Orders: -     Ozempic (0.25 or 0.5 MG/DOSE); Inject 0.25 mg into the skin once a week.  Dispense: 3 mL; Refill: 0  Weight gain due to medication Assessment & Plan: Patient on antiepileptics, steroids and immunomodulators.  She is not a good candidate for metformin because of chronic loose stools.  We discussed the role of GLP-1 therapy and she would like to try semaglutide to offset some of the weight gain associated with her medications also for management of her prediabetes and obesity.  Orders: -     Ozempic (0.25 or 0.5 MG/DOSE); Inject 0.25 mg into the skin once a week.  Dispense: 3 mL; Refill: 0  Prediabetes Assessment &  Plan: Most recent A1c is  Lab Results  Component Value Date   HGBA1C 5.9 (H) 05/22/2023   HGBA1C 5.6 09/01/2020    Patient aware of disease state and risk of progression. This may contribute to abnormal cravings, fatigue and diabetic complications without having diabetes.   We have discussed treatment options which include: losing 7 to 10% of body weight, increasing physical activity to a goal of 150 minutes a week at moderate intensity.  Advised to maintain a diet low on simple and processed carbohydrates.  Patient has had progressive weight gain, she is also on obesogenic medications which increase her risk of diabetes progression.  She will benefit from pharmacoprophylaxis but because of chronic loose stools metformin is not an option.  We discussed the role of GLP-1 therapy and diabetes prevention and she is agreeable to starting semaglutide 0.25 mg once a week for pharmacoprophylaxis and weight management   Orders: -     Ozempic (0.25 or 0.5 MG/DOSE); Inject 0.25 mg into the skin once a week.  Dispense: 3 mL; Refill: 0    PHYSICAL EXAM:  Blood pressure 138/84, pulse 99, temperature 98 F (36.7 C), height 4\' 10"  (1.473 m), weight 200 lb (90.7 kg), SpO2 96%. Body mass index is 41.8 kg/m.  General: She is overweight, cooperative, alert, well developed, and in no acute distress. PSYCH: Has normal  mood, affect and thought process.   HEENT: EOMI, sclerae are anicteric. Lungs: Normal breathing effort, no conversational dyspnea. Extremities: No edema.  Neurologic: No gross sensory or motor deficits. No tremors or fasciculations noted.    DIAGNOSTIC DATA REVIEWED:  BMET    Component Value Date/Time   NA 138 05/03/2023 1235   K 4.0 05/03/2023 1235   CL 102 05/03/2023 1235   CO2 29 05/03/2023 1235   GLUCOSE 148 (H) 05/03/2023 1235   BUN 7 (L) 05/03/2023 1235   CREATININE 0.58 05/03/2023 1235   CREATININE 0.58 (L) 09/01/2020 1139   CALCIUM 8.8 (L) 05/03/2023 1235   GFRNONAA  >60 05/03/2023 1235   GFRNONAA 93 09/01/2020 1139   GFRAA 107 09/01/2020 1139   Lab Results  Component Value Date   HGBA1C 5.9 (H) 05/22/2023   HGBA1C 5.6 09/01/2020   Lab Results  Component Value Date   INSULIN 5.0 05/22/2023   Lab Results  Component Value Date   TSH 0.918 11/09/2022   CBC    Component Value Date/Time   WBC 3.5 (L) 05/03/2023 1235   RBC 3.76 (L) 05/03/2023 1235   HGB 11.8 (L) 05/03/2023 1235   HCT 38.2 05/03/2023 1235   PLT 226 05/03/2023 1235   MCV 101.6 (H) 05/03/2023 1235   MCH 31.4 05/03/2023 1235   MCHC 30.9 05/03/2023 1235   RDW 15.1 05/03/2023 1235   Iron Studies    Component Value Date/Time   IRON 58 05/22/2023 0859   TIBC 370 05/22/2023 0859   FERRITIN 57 05/22/2023 0859   IRONPCTSAT 16 05/22/2023 0859   Lipid Panel     Component Value Date/Time   CHOL 216 (H) 05/22/2023 0859   TRIG 83 05/22/2023 0859   HDL 77 05/22/2023 0859   CHOLHDL 3 06/26/2022 1124   VLDL 23.2 06/26/2022 1124   LDLCALC 124 (H) 05/22/2023 0859   LDLCALC 110 (H) 09/01/2020 1139   Hepatic Function Panel     Component Value Date/Time   PROT 6.4 (L) 05/03/2023 1235   ALBUMIN 3.9 05/03/2023 1235   AST 15 05/03/2023 1235   ALT 11 05/03/2023 1235   ALKPHOS 53 05/03/2023 1235   BILITOT 0.6 05/03/2023 1235   BILIDIR 0.0 09/05/2016 0912      Component Value Date/Time   TSH 0.918 11/09/2022 0023   TSH 1.03 06/26/2022 1124   Nutritional Lab Results  Component Value Date   VD25OH 35.3 05/22/2023   VD25OH 54.11 08/25/2021   VD25OH 14.22 (L) 05/26/2021     Return in about 3 weeks (around 08/16/2023) for For Weight Mangement with Dr. [name] - 40 minutes, complex.Marland Kitchen She was informed of the importance of frequent follow up visits to maximize her success with intensive lifestyle modifications for her multiple health conditions.   ATTESTASTION STATEMENTS:  Reviewed by clinician on day of visit: allergies, medications, problem list, medical history, surgical  history, family history, social history, and previous encounter notes.   I have spent 40 minutes in the care of the patient today including: preparing to see patient (e.g. review and interpretation of tests, old notes ), obtaining and/or reviewing separately obtained history, performing a medically appropriate examination or evaluation, counseling and educating the patient, ordering medications, test or procedures, documenting clinical information in the electronic or other health care record, and independently interpreting results and communicating results to the patient, family, or caregiver   Worthy Rancher, MD

## 2023-07-27 DIAGNOSIS — M19022 Primary osteoarthritis, left elbow: Secondary | ICD-10-CM | POA: Diagnosis not present

## 2023-07-27 DIAGNOSIS — Z7982 Long term (current) use of aspirin: Secondary | ICD-10-CM | POA: Diagnosis not present

## 2023-07-27 DIAGNOSIS — D649 Anemia, unspecified: Secondary | ICD-10-CM | POA: Diagnosis not present

## 2023-07-27 DIAGNOSIS — J45909 Unspecified asthma, uncomplicated: Secondary | ICD-10-CM | POA: Diagnosis not present

## 2023-07-27 DIAGNOSIS — M069 Rheumatoid arthritis, unspecified: Secondary | ICD-10-CM | POA: Diagnosis not present

## 2023-07-27 DIAGNOSIS — M810 Age-related osteoporosis without current pathological fracture: Secondary | ICD-10-CM | POA: Diagnosis not present

## 2023-07-27 DIAGNOSIS — Z6841 Body Mass Index (BMI) 40.0 and over, adult: Secondary | ICD-10-CM | POA: Diagnosis not present

## 2023-07-27 DIAGNOSIS — M542 Cervicalgia: Secondary | ICD-10-CM | POA: Diagnosis not present

## 2023-08-02 DIAGNOSIS — D649 Anemia, unspecified: Secondary | ICD-10-CM | POA: Diagnosis not present

## 2023-08-02 DIAGNOSIS — Z6841 Body Mass Index (BMI) 40.0 and over, adult: Secondary | ICD-10-CM | POA: Diagnosis not present

## 2023-08-02 DIAGNOSIS — M069 Rheumatoid arthritis, unspecified: Secondary | ICD-10-CM | POA: Diagnosis not present

## 2023-08-02 DIAGNOSIS — J45909 Unspecified asthma, uncomplicated: Secondary | ICD-10-CM | POA: Diagnosis not present

## 2023-08-02 DIAGNOSIS — M810 Age-related osteoporosis without current pathological fracture: Secondary | ICD-10-CM | POA: Diagnosis not present

## 2023-08-02 DIAGNOSIS — Z7982 Long term (current) use of aspirin: Secondary | ICD-10-CM | POA: Diagnosis not present

## 2023-08-02 DIAGNOSIS — M19022 Primary osteoarthritis, left elbow: Secondary | ICD-10-CM | POA: Diagnosis not present

## 2023-08-02 DIAGNOSIS — M542 Cervicalgia: Secondary | ICD-10-CM | POA: Diagnosis not present

## 2023-08-03 DIAGNOSIS — M810 Age-related osteoporosis without current pathological fracture: Secondary | ICD-10-CM | POA: Diagnosis not present

## 2023-08-03 DIAGNOSIS — J45909 Unspecified asthma, uncomplicated: Secondary | ICD-10-CM | POA: Diagnosis not present

## 2023-08-03 DIAGNOSIS — M19022 Primary osteoarthritis, left elbow: Secondary | ICD-10-CM | POA: Diagnosis not present

## 2023-08-03 DIAGNOSIS — D649 Anemia, unspecified: Secondary | ICD-10-CM | POA: Diagnosis not present

## 2023-08-03 DIAGNOSIS — Z7982 Long term (current) use of aspirin: Secondary | ICD-10-CM | POA: Diagnosis not present

## 2023-08-03 DIAGNOSIS — M069 Rheumatoid arthritis, unspecified: Secondary | ICD-10-CM | POA: Diagnosis not present

## 2023-08-03 DIAGNOSIS — Z6841 Body Mass Index (BMI) 40.0 and over, adult: Secondary | ICD-10-CM | POA: Diagnosis not present

## 2023-08-03 DIAGNOSIS — M542 Cervicalgia: Secondary | ICD-10-CM | POA: Diagnosis not present

## 2023-08-06 ENCOUNTER — Encounter: Payer: Self-pay | Admitting: Family Medicine

## 2023-08-06 ENCOUNTER — Ambulatory Visit: Payer: Medicare PPO | Admitting: Family Medicine

## 2023-08-06 VITALS — BP 136/72 | HR 101 | Temp 98.5°F | Ht <= 58 in | Wt 203.0 lb

## 2023-08-06 DIAGNOSIS — Z8719 Personal history of other diseases of the digestive system: Secondary | ICD-10-CM | POA: Diagnosis not present

## 2023-08-06 DIAGNOSIS — K5900 Constipation, unspecified: Secondary | ICD-10-CM | POA: Diagnosis not present

## 2023-08-06 DIAGNOSIS — M069 Rheumatoid arthritis, unspecified: Secondary | ICD-10-CM

## 2023-08-06 DIAGNOSIS — R1032 Left lower quadrant pain: Secondary | ICD-10-CM | POA: Diagnosis not present

## 2023-08-06 DIAGNOSIS — E559 Vitamin D deficiency, unspecified: Secondary | ICD-10-CM

## 2023-08-06 MED ORDER — PREDNISONE 5 MG PO TABS
5.0000 mg | ORAL_TABLET | Freq: Every day | ORAL | 0 refills | Status: DC
Start: 2023-08-06 — End: 2023-12-19

## 2023-08-06 MED ORDER — SENNOSIDES-DOCUSATE SODIUM 8.6-50 MG PO TABS
1.0000 | ORAL_TABLET | Freq: Every day | ORAL | 2 refills | Status: AC
Start: 2023-08-06 — End: ?

## 2023-08-06 NOTE — Progress Notes (Signed)
Established Patient Office Visit   Subjective  Patient ID: [name], female    DOB: June 29, 1951  Age: 72 y.o. MRN: [id number]  Chief Complaint  Patient presents with   Diverticulitis    Started 4 days ago, stomach pain, patient has been taken her Prednisone 2 days 20mg  -3 days 15 mg     Patient is a 72 year old female who presents for follow-up on acute concern.  Patient was having stomach pain and felt like it was diverticulitis.  She started a prednisone taper with her daily prednisone for rheumatoid arthritis.  Was having some constipation.  BMs were little balls.  Denies diarrhea, bloody stools.  Requesting refill on prednisone so she will not be out of medication.   Patient Active Problem List   Diagnosis Date Noted   Occipital neuralgia of left side 07/14/2023   Neck pain 07/14/2023   Prediabetes 07/04/2023   Diarrhea 07/04/2023   Abnormal metabolism 05/22/2023   Weight gain due to medication 05/22/2023   Low serum vitamin B12 05/22/2023   Class 3 severe obesity with serious comorbidity and body mass index (BMI) of 40.0 to 44.9 in adult [facility]) 05/22/2023   Anemia 05/22/2023   Depression screen 05/22/2023   Other fatigue 05/22/2023   Suspected sleep apnea 05/22/2023   Rheumatoid arthritis (HCC) 11/29/2022   Infection due to ESBL-producing Escherichia coli 11/09/2022   Acute pyelonephritis 11/09/2022   Bacteremia 11/09/2022   Sepsis (HCC) 11/09/2022   SOB (shortness of breath) on exertion 11/08/2022   DJD (degenerative joint disease) of knee 04/13/2022   S/P TKR (total knee replacement), left 04/13/2022   Arthritis of right knee    S/P total knee arthroplasty, right 12/08/2021   Pressure injury of skin 08/27/2021   Fall 08/23/2021   Difficulty in walking 08/23/2021   Bilateral lower extremity edema 08/23/2021   Hormone replacement therapy 08/23/2021   Osteoporosis 01/28/2021   Osteoarthritis of right wrist 01/28/2021   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   Foot  pain 09/13/2020   Knee pain 08/24/2018   Fall in home 07/14/2015   TIBIALIS TENDINITIS 08/25/2009   CHEST PAIN, OTHER, PAIN 10/21/2008   PEDAL EDEMA 07/22/2008   ANKLE PAIN, BILATERAL 04/01/2008   LOW BACK PAIN 04/01/2008   ACUTE BRONCHITIS 09/23/2007   SARCOIDOSIS 09/02/2007   Asthma 09/02/2007   Diverticulosis of colon 07/11/2007   Backache 07/11/2007   Past Medical History:  Diagnosis Date   Acute bronchitis    Allergy    Anemia    Anxiety    Arthritis    knees   Asthma    Back pain    Backache, unspecified    Cataract    removed both eyes   Depression    Diverticulitis    Diverticulosis of colon (without mention of hemorrhage)    Edema    GERD (gastroesophageal reflux disease)    Glaucoma    Joint pain    Lactose intolerance    Lumbago    Neuropathy    Obesity    Osteoarthritis    Other chest pain    Pain in joint, ankle and foot    Rheumatoid arthritis (HCC)    Sarcoidosis    SOB (shortness of breath)    Tibialis tendinitis    Unspecified asthma(493.90)    Past Surgical History:  Procedure Laterality Date   ABDOMINAL HYSTERECTOMY     CATARACT EXTRACTION W/PHACO Right 06/24/2014   Procedure: CATARACT EXTRACTION PHACO AND INTRAOCULAR LENS PLACEMENT (IOC) RIGHT EYE WITH  GONIOSYNECHIALYSIS;  Surgeon: Chalmers Guest, MD;  Location: [facility] OR;  Service: Ophthalmology;  Laterality: Right;   COLONOSCOPY     EYE SURGERY Left    cataract surgery   HAND SURGERY Right    trigger finger release   IR RADIOLOGIST EVAL & MGMT  11/14/2018   KNEE ARTHROSCOPY Right    TOTAL KNEE ARTHROPLASTY Right 12/08/2021   Procedure: RIGHT TOTAL KNEE ARTHROPLASTY APPLICATION OF WOUND VAC;  Surgeon: Cammy Copa, MD;  Location: [facility] OR;  Service: Orthopedics;  Laterality: Right;   TOTAL KNEE ARTHROPLASTY Left 04/13/2022   Procedure: LEFT TOTAL KNEE ARTHROPLASTY;  Surgeon: Cammy Copa, MD;  Location: [facility] OR;  Service: Orthopedics;  Laterality: Left;   Social History   Tobacco  Use   Smoking status: Never   Smokeless tobacco: Never  Vaping Use   Vaping status: Never Used  Substance Use Topics   Alcohol use: Yes    Comment: 1 glass of wine or mixed drink once a week   Drug use: No   Family History  Problem Relation Age of Onset   Hypertension Mother    Stroke Mother    Depression Mother    Anxiety disorder Mother    Obesity Mother    Diabetes Father    Sarcoidosis Sister    Colon cancer Neg Hx    Colon polyps Neg Hx    Esophageal cancer Neg Hx    Rectal cancer Neg Hx    Stomach cancer Neg Hx    Allergies  Allergen Reactions   Invanz [Ertapenem] Swelling    "Lips swelled up"   Ibuprofen Rash   Sulfa Antibiotics Rash      ROS Negative unless stated above    Objective:     BP 136/72 (BP Location: Left Arm, Patient Position: Sitting, Cuff Size: Large)   Pulse (!) 101   Temp 98.5 F (36.9 C) (Oral)   Ht 4\' 10"  (1.473 m)   Wt 203 lb (92.1 kg)   SpO2 93%   BMI 42.43 kg/m  BP Readings from Last 3 Encounters:  08/06/23 136/72  07/26/23 138/84  07/13/23 (!) 145/83   Wt Readings from Last 3 Encounters:  08/06/23 203 lb (92.1 kg)  07/26/23 200 lb (90.7 kg)  07/25/23 197 lb (89.4 kg)      Physical Exam Constitutional:      General: She is not in acute distress.    Appearance: Normal appearance.  HENT:     Head: Normocephalic and atraumatic.     Nose: Nose normal.     Mouth/Throat:     Mouth: Mucous membranes are moist.  Cardiovascular:     Rate and Rhythm: Normal rate and regular rhythm.     Heart sounds: Normal heart sounds. No murmur heard.    No gallop.  Pulmonary:     Effort: Pulmonary effort is normal. No respiratory distress.     Breath sounds: Normal breath sounds. No wheezing, rhonchi or rales.  Abdominal:     General: Bowel sounds are normal. There is no distension.     Palpations: Abdomen is soft.     Tenderness: There is no abdominal tenderness. There is no guarding or rebound.  Skin:    General: Skin is warm  and dry.  Neurological:     Mental Status: She is alert and oriented to person, place, and time.     No results found for any visits on 08/06/23.    Assessment & Plan:  Left lower quadrant  abdominal pain  History of diverticulitis  Rheumatoid arthritis involving multiple sites, unspecified whether rheumatoid factor present (HCC) -     predniSONE; Take 1 tablet (5 mg total) by mouth daily.  Dispense: 30 tablet; Refill: 0  Constipation, unspecified constipation type -     Sennosides-Docusate Sodium; Take 1 tablet by mouth daily.  Dispense: 30 tablet; Refill: 2  Pt recent symptoms possibly 2/2 constipation and not diverticulitis.  Patient advised to avoid self-medicating in the future.  Senna and Colace for constipation.  Limit refill on prednisone given for rheumatoid arthritis.  Further refills with rheumatology.  Given strict precautions for continued or worsening symptoms.  Return in about 3 months (around 11/06/2023), or if symptoms worsen or fail to improve.   Deeann Saint, MD

## 2023-08-06 NOTE — Patient Instructions (Addendum)
Your vitamin D level has improved and is now above the normal range at 35.3 when checked on 06/01/2023.  You can take over-the-counter vitamin D 1000-2000 units daily to help keep your levels in normal range.  A refill on your prescription for prednisone sent to your pharmacy.  Continue follow-up with rheumatology.   A prescription for Colace-senna was sent to your pharmacy.  This is a medication that will constipation.

## 2023-08-07 DIAGNOSIS — Z7982 Long term (current) use of aspirin: Secondary | ICD-10-CM | POA: Diagnosis not present

## 2023-08-07 DIAGNOSIS — M069 Rheumatoid arthritis, unspecified: Secondary | ICD-10-CM | POA: Diagnosis not present

## 2023-08-07 DIAGNOSIS — D649 Anemia, unspecified: Secondary | ICD-10-CM | POA: Diagnosis not present

## 2023-08-07 DIAGNOSIS — M19022 Primary osteoarthritis, left elbow: Secondary | ICD-10-CM | POA: Diagnosis not present

## 2023-08-07 DIAGNOSIS — M810 Age-related osteoporosis without current pathological fracture: Secondary | ICD-10-CM | POA: Diagnosis not present

## 2023-08-07 DIAGNOSIS — M542 Cervicalgia: Secondary | ICD-10-CM | POA: Diagnosis not present

## 2023-08-07 DIAGNOSIS — Z6841 Body Mass Index (BMI) 40.0 and over, adult: Secondary | ICD-10-CM | POA: Diagnosis not present

## 2023-08-07 DIAGNOSIS — J45909 Unspecified asthma, uncomplicated: Secondary | ICD-10-CM | POA: Diagnosis not present

## 2023-08-08 ENCOUNTER — Telehealth: Payer: Self-pay | Admitting: Physical Medicine and Rehabilitation

## 2023-08-08 ENCOUNTER — Telehealth: Payer: Self-pay | Admitting: Family Medicine

## 2023-08-08 DIAGNOSIS — J4541 Moderate persistent asthma with (acute) exacerbation: Secondary | ICD-10-CM

## 2023-08-08 NOTE — Telephone Encounter (Signed)
[facility] is calling she saw the patient yesterday and pt was having wheezing in left lung and cough. Charrise has not seen patient today

## 2023-08-08 NOTE — Telephone Encounter (Signed)
[name] (PT) from [facility] health called requesting verbal orders for 2 wk 3. [name] secure phone number is [phone].

## 2023-08-09 MED ORDER — FLUTICASONE-SALMETEROL 250-50 MCG/ACT IN AEPB: INHALATION_SPRAY | RESPIRATORY_TRACT | 1 refills | Status: DC

## 2023-08-09 MED ORDER — FLUTICASONE-SALMETEROL 250-50 MCG/ACT IN AEPB
INHALATION_SPRAY | RESPIRATORY_TRACT | 1 refills | Status: DC
Start: 2023-08-09 — End: 2023-10-12

## 2023-08-13 ENCOUNTER — Ambulatory Visit (INDEPENDENT_AMBULATORY_CARE_PROVIDER_SITE_OTHER): Payer: Medicare PPO | Admitting: Neurology

## 2023-08-13 ENCOUNTER — Encounter: Payer: Self-pay | Admitting: Neurology

## 2023-08-13 VITALS — BP 136/78 | HR 97 | Ht <= 58 in | Wt 203.0 lb

## 2023-08-13 DIAGNOSIS — Z862 Personal history of diseases of the blood and blood-forming organs and certain disorders involving the immune mechanism: Secondary | ICD-10-CM | POA: Diagnosis not present

## 2023-08-13 DIAGNOSIS — M542 Cervicalgia: Secondary | ICD-10-CM | POA: Diagnosis not present

## 2023-08-13 DIAGNOSIS — R5382 Chronic fatigue, unspecified: Secondary | ICD-10-CM | POA: Diagnosis not present

## 2023-08-13 DIAGNOSIS — G4710 Hypersomnia, unspecified: Secondary | ICD-10-CM | POA: Insufficient documentation

## 2023-08-13 NOTE — Progress Notes (Signed)
SLEEP MEDICINE CLINIC    Provider:  Melvyn Novas, MD  Primary Care Physician:  Deeann Saint, MD [address] [place] [address]   Primary Neurologist is Dr Terrace Arabia, MD, PhD.    Referring Provider: Worthy Rancher, [place] [address] [place],  [place] [address]          Chief Complaint according to patient   Patient presents with:     New Sleep Patient (Initial Visit),            HISTORY OF PRESENT ILLNESS:  [name] is a 72 y.o. female patient who is seen upon referral from Dr [name] on 08/13/2023  for a sleep medicine consultation..  Chief concern according to patient :  " I have neck pain ( Dr Terrace Arabia )  and rheumatoid arthritis, weight issues. Has lost 20 pounds and is now at BMI 41. Patient has symptoms of sleep disordered breathing and high risk phenotype. Counseled on risks associated with undiagnosed / untreated sleep disorders.  Losing 15% of BW may improve symptoms.  She is a snorer, and her late husband commented on that. She lost him 9 years ago.  I have the pleasure of seeing Sadeem Collaso Kappes on 08/13/23 a right -handed AA female with a possible sleep disorder.     Sleep relevant medical history: Nocturia at 2 and 4 AM , no ENT surgeries.    Family medical /sleep history: no other family member on CPAP with OSA, nobody with insomnia, no sleep walkers.    Social history: grew up in a [place]  2 siblings, still working as a Clinical research associate, widowed since 2015.  Patient is working as an Pensions consultant  and lives in a household alone.  No pets. Childless.  The patient currently works part time, reduced client list  Tobacco use; none .  ETOH use ; yes, socially -2/ week.  Caffeine intake in form of Coffee( 24 ounces) Soda( /) Tea ( yes, home made) , no energy drinks. Regular Exercise; walking with walker currently.       Sleep habits are as follows: The patient's dinner time is variable , sometimes skipped. The patient goes to bed at 11 PM - 1 AM, and asleep  promptly- she works frequently at 1 AM- 3 AM, continues to sleep for 6 hours, fragmented , wakes for 2 bathroom breaks, the first time at 2 AM.   The preferred sleep position is lateral , with the support of 2 pillows. Neck support. Dreams are reportedly rare.  The patient wakes up spontaneously at 6.30-7 without an alarm. She stays in bed after waking up, rises at 8.30 AM . She reports not feeling refreshed or restored in AM, with symptoms such as  residual fatigue.  Naps are taken infrequently, lasting from 1-3 hours , after lunch-  are more refreshing than nocturnal sleep.    Review of Systems: Out of a complete 14 system review, the patient complains of only the following symptoms, and all other reviewed systems are negative.:  Fatigue, sleepiness , snoring, fragmented sleep, Nocturia , no routines.    How likely are you to doze in the following situations: 0 = not likely, 1 = slight chance, 2 = moderate chance, 3 = high chance   Sitting and Reading? Watching Television? Sitting inactive in a public place (theater or meeting)? As a passenger in a car for an hour without a break? Lying down in the afternoon when circumstances permit? Sitting and  talking to someone? Sitting quietly after lunch without alcohol? In a car, while stopped for a few minutes in traffic?   Total = 11/ 24 points  FSS endorsed at 43/ 63 points.  GDS 2/ 15   Social History   Socioeconomic History   Marital status: Widowed    Spouse name: Not on file   Number of children: 0   Years of education: 16 years , JD    Highest education level: Professional school degree (e.g., MD, DDS, DVM, JD)  Occupational History   Occupation: Pensions consultant (semi-retired)  Tobacco Use   Smoking status: Never   Smokeless tobacco: Never  Vaping Use   Vaping status: Never Used  Substance and Sexual Activity   Alcohol use: Yes    Comment: 1 glass of wine or mixed drink once a week   Drug use: No   Sexual activity: Not on file   Other Topics Concern   Not on file  Social History Narrative   Not on file   Social Determinants of Health   Financial Resource Strain: Low Risk  (08/05/2023)   Overall Financial Resource Strain (CARDIA)    Difficulty of Paying Living Expenses: Not hard at all  Food Insecurity: No Food Insecurity (08/05/2023)   Hunger Vital Sign    Worried About Running Out of Food in the Last Year: Never true    Ran Out of Food in the Last Year: Never true  Transportation Needs: Unmet Transportation Needs (08/05/2023)   PRAPARE - Transportation    Lack of Transportation (Medical): Yes    Lack of Transportation (Non-Medical): Yes  Physical Activity: Insufficiently Active (08/05/2023)   Exercise Vital Sign    Days of Exercise per Week: 4 days    Minutes of Exercise per Session: 20 min  Stress: Stress Concern Present (08/05/2023)   [employer] of Occupational Health - Occupational Stress Questionnaire    Feeling of Stress : To some extent  Social Connections: Moderately Integrated (08/05/2023)   Social Connection and Isolation Panel [NHANES]    Frequency of Communication with Friends and Family: More than three times a week    Frequency of Social Gatherings with Friends and Family: Twice a week    Attends Religious Services: More than 4 times per year    Active Member of [employer] or Organizations: Yes    Attends Banker Meetings: More than 4 times per year    Marital Status: Widowed    Family History  Problem Relation Age of Onset   Hypertension Mother    Stroke Mother    Depression Mother    Anxiety disorder Mother    Obesity Mother    Diabetes Father    Sarcoidosis Sister    Colon cancer Neg Hx    Colon polyps Neg Hx    Esophageal cancer Neg Hx    Rectal cancer Neg Hx    Stomach cancer Neg Hx     Past Medical History:  Diagnosis Date   Acute bronchitis    Allergy    Anemia    Anxiety    Arthritis    knees   Asthma    Back pain    Backache, unspecified     Cataract    removed both eyes   Depression    Diverticulitis    Diverticulosis of colon (without mention of hemorrhage)    Edema    GERD (gastroesophageal reflux disease)    Glaucoma    Joint pain    Lactose intolerance  Lumbago    Neuropathy    Obesity    Osteoarthritis    Other chest pain    Pain in joint, ankle and foot    Rheumatoid arthritis (HCC)    Sarcoidosis    SOB (shortness of breath)    Tibialis tendinitis    Unspecified asthma(493.90)     Past Surgical History:  Procedure Laterality Date   ABDOMINAL HYSTERECTOMY     CATARACT EXTRACTION W/PHACO Right 06/24/2014   Procedure: CATARACT EXTRACTION PHACO AND INTRAOCULAR LENS PLACEMENT (IOC) RIGHT EYE WITH GONIOSYNECHIALYSIS;  Surgeon: Chalmers Guest, MD;  Location: [facility] OR;  Service: Ophthalmology;  Laterality: Right;   COLONOSCOPY     EYE SURGERY Left    cataract surgery   HAND SURGERY Right    trigger finger release   IR RADIOLOGIST EVAL & MGMT  11/14/2018   KNEE ARTHROSCOPY Right    TOTAL KNEE ARTHROPLASTY Right 12/08/2021   Procedure: RIGHT TOTAL KNEE ARTHROPLASTY APPLICATION OF WOUND VAC;  Surgeon: Cammy Copa, MD;  Location: [facility] OR;  Service: Orthopedics;  Laterality: Right;   TOTAL KNEE ARTHROPLASTY Left 04/13/2022   Procedure: LEFT TOTAL KNEE ARTHROPLASTY;  Surgeon: Cammy Copa, MD;  Location: [facility] OR;  Service: Orthopedics;  Laterality: Left;     Current Outpatient Medications on File Prior to Visit  Medication Sig Dispense Refill   acetaminophen (TYLENOL) 500 MG tablet Take 1,000 mg by mouth in the morning and at bedtime.     albuterol (VENTOLIN HFA) 108 (90 Base) MCG/ACT inhaler TAKE 2 PUFFS BY MOUTH EVERY 6 HOURS AS NEEDED (Patient taking differently: Inhale 2 puffs into the lungs every 6 (six) hours as needed for wheezing or shortness of breath. TAKE 2 PUFFS BY MOUTH EVERY 6 HOURS AS NEEDED) 18 g 3   alendronate (FOSAMAX) 70 MG tablet Take 70 mg by mouth every Monday.     amLODipine (NORVASC)  5 MG tablet Take 1 tablet (5 mg total) by mouth daily. 90 tablet 1   amoxicillin (AMOXIL) 500 MG capsule Take 2,000 mg by mouth See admin instructions. Take 2000mg  by mouth 1 hour before appt.     aspirin EC 325 MG tablet Take 325 mg by mouth daily.     Blood Pressure Monitor MISC 1 Device by Does not apply route daily. 1 each 0   Cholecalciferol (VITAMIN D3) 50 MCG (2000 UT) capsule      cyanocobalamin (VITAMIN B12) 1000 MCG/ML injection Inject 1,000 mcg into the muscle every 30 (thirty) days.     cycloSPORINE (RESTASIS) 0.05 % ophthalmic emulsion Place 1 drop into both eyes 2 (two) times daily.     DULoxetine (CYMBALTA) 30 MG capsule Take 1 capsule (30 mg total) by mouth daily. 30 capsule 1   fexofenadine-pseudoephedrine (ALLEGRA-D ALLERGY & CONGESTION) 60-120 MG 12 hr tablet Take 1 tablet by mouth every morning.     fluticasone (FLONASE) 50 MCG/ACT nasal spray Place 1 spray into both nostrils daily. 16 g 3   fluticasone-salmeterol (WIXELA INHUB) 250-50 MCG/ACT AEPB INHALE 1 PUFF BY MOUTH TWICE A DAY Strength: 250-50 MCG/ACT 120 each 1   folic acid (FOLVITE) 1 MG tablet Take 1 tablet by mouth once a day 90 tablet 1   gabapentin (NEURONTIN) 300 MG capsule Take 1 capsule (300 mg total) by mouth 2 (two) times daily. 180 capsule 3   IYUZEH 0.005 % SOLN SMARTSIG:1 Drop(s) In Eye(s) Every Evening     KEVZARA 200 MG/1. SOAJ Inject into the skin.  Lactase (LACTAID FAST ACT) 9000 units CHEW Chew by mouth. Dosage unknown     leflunomide (ARAVA) 20 MG tablet Take 20 mg by mouth daily.     levocetirizine (XYZAL) 5 MG tablet TAKE ONE TABLET BY MOUTH EVERY IN THE EVENING 30 tablet 1   LUMIGAN 0.01 % SOLN SMARTSIG:1 Drop(s) In Eye(s) Every Evening     meloxicam (MOBIC) 15 MG tablet Take 1 tablet (15 mg total) by mouth daily. 30 tablet 0   Multiple Vitamins-Minerals (MULTIVITAMIN) tablet Take 1 tablet by mouth daily.     pantoprazole (PROTONIX) 40 MG tablet Take 1 tablet (40 mg total) by mouth daily.  30 tablet 0   polyethylene glycol powder (GLYCOLAX/MIRALAX) 17 GM/SCOOP powder Take 17 g by mouth daily as needed. 225 g 0   predniSONE (DELTASONE) 5 MG tablet Take 1 tablet (5 mg total) by mouth daily. 30 tablet 0   RHOPRESSA 0.02 % SOLN Ophthalmic for 56 Days     RHOPRESSA 0.02 % SOLN Apply 1 drop to eye at bedtime.     Semaglutide,0.25 or 0.5MG /DOS, (OZEMPIC, 0.25 OR 0.5 MG/DOSE,) 2 MG/3ML SOPN Inject 0.25 mg into the skin once a week. 3 mL 0   senna-docusate (SENOKOT-S) 8.6-50 MG tablet Take 1 tablet by mouth daily. 30 tablet 2   Current Facility-Administered Medications on File Prior to Visit  Medication Dose Route Frequency Provider Last Rate Last Admin   cyanocobalamin (VITAMIN B12) injection 1,000 mcg  1,000 mcg Intramuscular Once         Allergies  Allergen Reactions   Invanz [Ertapenem] Swelling    "Lips swelled up"   Ibuprofen Rash   Sulfa Antibiotics Rash     DIAGNOSTIC DATA (LABS, IMAGING, TESTING) - I reviewed patient records, labs, notes, testing and imaging myself where available.  Lab Results  Component Value Date   WBC 3.5 (L) 05/03/2023   HGB 11.8 (L) 05/03/2023   HCT 38.2 05/03/2023   MCV 101.6 (H) 05/03/2023   PLT 226 05/03/2023      Component Value Date/Time   NA 138 05/03/2023 1235   K 4.0 05/03/2023 1235   CL 102 05/03/2023 1235   CO2 29 05/03/2023 1235   GLUCOSE 148 (H) 05/03/2023 1235   BUN 7 (L) 05/03/2023 1235   CREATININE 0.58 05/03/2023 1235   CREATININE 0.58 (L) 09/01/2020 1139   CALCIUM 8.8 (L) 05/03/2023 1235   PROT 6.4 (L) 05/03/2023 1235   ALBUMIN 3.9 05/03/2023 1235   AST 15 05/03/2023 1235   ALT 11 05/03/2023 1235   ALKPHOS 53 05/03/2023 1235   BILITOT 0.6 05/03/2023 1235   GFRNONAA >60 05/03/2023 1235   GFRNONAA 93 09/01/2020 1139   GFRAA 107 09/01/2020 1139   Lab Results  Component Value Date   CHOL 216 (H) 05/22/2023   HDL 77 05/22/2023   LDLCALC 124 (H) 05/22/2023   TRIG 83 05/22/2023   CHOLHDL 3 06/26/2022   Lab  Results  Component Value Date   HGBA1C 5.9 (H) 05/22/2023   Lab Results  Component Value Date   VITAMINB12 221 06/26/2022   Lab Results  Component Value Date   TSH 0.918 11/09/2022    PHYSICAL EXAM:  Today's Vitals   08/13/23 1256 08/13/23 1301  BP: (!) 140/92 136/78  Pulse: 97 97  Weight: 203 lb (92.1 kg)   Height: 4\' 10"  (1.473 m)    Body mass index is 42.43 kg/m.   Wt Readings from Last 3 Encounters:  08/13/23 203 lb (92.1 kg)  08/06/23 203 lb (92.1 kg)  07/26/23 200 lb (90.7 kg)     Ht Readings from Last 3 Encounters:  08/13/23 4\' 10"  (1.473 m)  08/06/23 4\' 10"  (1.473 m)  07/26/23 4\' 10"  (1.473 m)      General: The patient is awake, alert and appears not in acute distress. The patient is well groomed. Head: Normocephalic, atraumatic. Neck is supple. Mallampati 3,  neck circumference:16 inches . Nasal airflow fully patent.  Retrognathia is not  seen.  Dental status: biological/ regular  Cardiovascular:  Regular rate and cardiac rhythm by pulse,  without distended neck veins. Respiratory: Lungs are clear to auscultation.  Skin:  Without evidence of ankle edema, or rash. Trunk: The patient's posture is stooped, with a neck hump.    NEUROLOGIC EXAM: Attention span & concentration ability appears normal.  Speech is fluent,  without  dysarthria, dysphonia or aphasia.  Mood and affect are appropriate.   Cranial nerves: no loss of smell or taste reported  Pupils are not equal , the left is disrounded and there is arcus senilis seen on both- and barely reactive to light. Funduscopic exam deferred.  Extraocular movements in vertical and horizontal planes were intact and without nystagmus. No Diplopia. Visual fields by finger perimetry are intact. Hearing loss right ear , tinnitus right.  Facial sensation intact to fine touch.  Facial motor strength is symmetric and tongue and uvula move midline.  Neck ROM : rotation, tilt and flexion extension were normal for age  and shoulder shrug was symmetrical.    Motor exam:  Symmetric bulk, tone and ROM.   Normal tone without cog wheeling, symmetric grip strength .   Sensory:  deferred   Coordination: Rapid alternating movements in the fingers/hands were of reduced speed.  The Finger-to-nose maneuver intact.    Gait and station: Patient could rise unassisted from a seated position, walked with a walker ( rollator ) assistive device.  Deep tendon reflexes: in the  upper and lower extremities are symmetric and intact.      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 poor routines for sleep endorsed- I would want the patient to work on a bed rime before 12 midnight, eliminate her computer and Tv form the bedroom and not drink caffeine after 3 PM.  The patient's current sleep habits would not allow a valid in lab sleep study.    2) Indeed, Dr Clinkscales has high risk anatomy for OSA- high grade Mallampati, neck size and BMI. Lack of core strength, abdominal girth can contribute to hypoventilation.   3)  fatigue can be high in all autoimmune diseases, such as rheumatoid arthritis.  Humira was exchanged [facility]    I agree with a HST evaluation. She has no history of cardiac disease, but sarcoidosis.    I plan to follow up either personally or through our NP within 3-5 months.   I would like to thank Deeann Saint, MD and Worthy Rancher, [place] [address] [place],  [place] [address] for allowing me to meet with and to take care of this pleasant patient.  Cc ; I will share my notes with Dr Levert Feinstein.   After spending a total time of  40  minutes face to face and additional time for physical and neurologic examination, review of laboratory studies,  personal review  of imaging studies, reports and results of other testing and review of referral information / records as far as provided  in visit,   Electronically signed by: Melvyn Novas, MD 08/13/2023 1:14 PM  [facility] Board certified by The [employer] of Sleep Medicine and Diplomate of the [employer] of Sleep Medicine. Board certified In Neurology through the ABPN, Fellow of the [employer] of Neurology.

## 2023-08-13 NOTE — Patient Instructions (Signed)
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 poor routines for sleep endorsed- I would want the patient to work on a bed rime before 12 midnight, eliminate her computer and Tv form the bedroom and not drink caffeine after 3 PM.  The patient's current sleep habits would not allow a valid in lab sleep study.    2) Indeed, Dr Barberi has high risk anatomy for OSA- high grade Mallampati, neck size and BMI. Lack of core strength, abdominal girth can contribute to hypoventilation.   3)  fatigue can be high in all autoimmune diseases, such as rheumatoid arthritis.  Humira was exchanged for [facility].    I agree with a HST evaluation. She has no history of cardiac disease, but sarcoidosis.    I plan to follow up either personally or through our NP within 3-5 months.   I would like to thank Deeann Saint, MD and Worthy Rancher, [place] [address] [place],  [place] [address] for allowing me to meet with and to take care of this pleasant patient.  Cc ; I will share my notes with Dr Levert Feinstein.   After spending a total time of  40  minutes face to face and additional time for physical and neurologic examination, review of laboratory studies,  personal review of imaging studies, reports and results of other testing and review of referral information / records as far as provided in visit,   Electronically signed by: Melvyn Novas, MD 08/13/2023 1:14 PM

## 2023-08-21 ENCOUNTER — Ambulatory Visit (INDEPENDENT_AMBULATORY_CARE_PROVIDER_SITE_OTHER): Payer: Medicare PPO | Admitting: Internal Medicine

## 2023-08-21 ENCOUNTER — Encounter (INDEPENDENT_AMBULATORY_CARE_PROVIDER_SITE_OTHER): Payer: Self-pay | Admitting: Internal Medicine

## 2023-08-21 DIAGNOSIS — Z6841 Body Mass Index (BMI) 40.0 and over, adult: Secondary | ICD-10-CM | POA: Diagnosis not present

## 2023-08-21 DIAGNOSIS — E66813 Obesity, class 3: Secondary | ICD-10-CM

## 2023-08-21 DIAGNOSIS — R29818 Other symptoms and signs involving the nervous system: Secondary | ICD-10-CM | POA: Diagnosis not present

## 2023-08-21 DIAGNOSIS — M059 Rheumatoid arthritis with rheumatoid factor, unspecified: Secondary | ICD-10-CM | POA: Diagnosis not present

## 2023-08-21 DIAGNOSIS — E669 Obesity, unspecified: Secondary | ICD-10-CM

## 2023-08-21 DIAGNOSIS — R7303 Prediabetes: Secondary | ICD-10-CM

## 2023-08-21 DIAGNOSIS — T50905A Adverse effect of unspecified drugs, medicaments and biological substances, initial encounter: Secondary | ICD-10-CM

## 2023-08-21 MED ORDER — OZEMPIC (0.25 OR 0.5 MG/DOSE) 2 MG/3ML ~~LOC~~ SOPN: 0.2500 mg | PEN_INJECTOR | SUBCUTANEOUS | 0 refills | Status: DC

## 2023-08-21 NOTE — Progress Notes (Signed)
Office: [phone]  /  Fax: [phone]  Weight Summary And Biometrics  Vitals Temp: 97.8 F (36.6 C) BP: 139/86 Pulse Rate: 94 SpO2: 94 %   Anthropometric Measurements Height: 4\' 10"  (1.473 m) Weight: 193 lb (87.5 kg) BMI (Calculated): 40.35 Weight at Last Visit: 200 lb Weight Lost Since Last Visit: 7 lb Weight Gained Since Last Visit: 0 lb Starting Weight: 202 lb Total Weight Loss (lbs): 9 lb (4.082 kg) Peak Weight: 220 lb   Body Composition  Body Fat %: 55.4 % Fat Mass (lbs): 107.2 lbs Muscle Mass (lbs): 82 lbs Visceral Fat Rating : 19    RMR: [id number]  Today's Visit #: 4  Starting Date: 05/22/23   Subjective   Chief Complaint: Obesity  [name] is here to discuss her progress with her obesity treatment plan. She is on the the Category 1 Plan and states she is following her eating plan approximately 60 % of the time. She states she is exercising 45 minutes 4 times per week.  Interval History:   Discussed the use of AI scribe software for clinical note transcription with the patient, who gave verbal consent to proceed.  History of Present Illness   The patient, affected by obesity, rheumatoid arthritis, and prediabetes, presents for a follow-up on weight management. She has been on several obesogenic drugs, including antiepileptics, steroids, and immunomodulators. She also has suspected sleep apnea and has been referred for a sleep study. Due to chronic loose stools, metformin was avoided, and she was started on semaglutide. Since the last office visit, she has lost seven pounds.  The patient reports some initial confusion with the semaglutide pen, leading to potential missed doses. However, she has completed three shots of the 0.25 dosage. She has noticed a decrease in hunger and has been eating smaller portions. She typically eats two meals a day, often on a luncheon plate. She has been supplementing her diet with protein bars and shakes to ensure adequate  protein intake.  Despite the weight loss, the patient has been experiencing some issues with her diet. She has lactose intolerance and has been using dairy-free alternatives. She also reports that she has been receiving food from others, as she has difficulty going out to buy groceries. She has been trying to balance her meals with protein and vegetables, but there is some concern about maintaining adequate nutrition.  The patient has been taking Centrum Silver as a multivitamin with minerals. She has noticed a decrease in her body mass index, but there is concern about potential muscle loss due to inadequate calorie intake.      Orexigenic Control:  Denies problems with appetite and hunger signals.  Denies problems with satiety and satiation.  Denies problems with eating patterns and portion control.  Denies abnormal cravings. Denies feeling deprived or restricted.   Barriers identified: need for convenience or prepackaged foods, limited food variation or intolerances, low volume of physical activity at present , orthopedic problems, medical conditions or chronic pain affecting mobility, medical comorbidities, presence of obesogenic drugs, and slow metabolism for age.   Pharmacotherapy for weight loss: She is currently taking Ozempic with diabetes as the primary indication with adequate clinical response  and without side effects..   Assessment and Plan   Treatment Plan For Obesity:  Recommended Dietary Goals  [name] is currently in the action stage of change. As such, her goal is to continue weight management plan. She has agreed to: continue current plan  Behavioral Intervention  We discussed the  following Behavioral Modification Strategies today: continue to work on maintaining a reduced calorie state, getting the recommended amount of protein, incorporating whole foods, making healthy choices, staying well hydrated and practicing mindfulness when eating..  Additional resources  provided today: None  Recommended Physical Activity Goals  [name] has been advised to work up to 150 minutes of moderate intensity aerobic activity a week and strengthening exercises 2-3 times per week for cardiovascular health, weight loss maintenance and preservation of muscle mass.   She has agreed to :  Think about enjoyable ways to increase daily physical activity and overcoming barriers to exercise and Increase physical activity in their day and reduce sedentary time (increase NEAT).  Pharmacotherapy  We discussed various medication options to help [name] with her weight loss efforts and we both agreed to : continue current anti-obesity medication regimen  Associated Conditions Addressed Today  Assessment and Plan    Obesity   She has lost seven pounds since the last visit and is currently on semaglutide (Ozempic) due to metformin intolerance, while also taking obesogenic drugs including antiepileptics, steroids, and immunomodulators. She reports a decreased appetite and portion sizes. We emphasized the importance of not skipping meals and ensuring adequate protein intake to prevent muscle loss. We discussed monitoring for muscle loss and the potential need to stop medication if it occurs, aiming for weight loss without compromising muscle mass and overall nutrition. We will continue semaglutide (Ozempic) at 0.25 mg weekly, ensure proper pen usage, including priming and using a fresh needle each time, increase protein intake through dietary sources such as lean meats, protein shakes, and Greek yogurt, avoid skipping meals and aim for at least two balanced meals per day, and follow up in four weeks to monitor progress and adjust treatment as necessary.  Prediabetes   She has prediabetes and is intolerant to metformin due to loose stools, currently managed with semaglutide, which aids in weight loss and appetite control. We discussed the benefits of semaglutide in preventing diabetes  progression and its role in weight management. We will continue semaglutide (Ozempic) and follow up in four weeks to reassess.  Rheumatoid Arthritis   She is on immunomodulators and steroids, contributing to weight gain. We discussed balancing weight management with ongoing rheumatoid arthritis treatment. We will continue current medications as prescribed by her rheumatologist and monitor for side effects or complications related to weight management.  Suspected Sleep Apnea   She has suspected sleep apnea and has been referred for a sleep study, which is pending. We will await sleep study results, follow up on results, and consider further management based on findings.  General Health Maintenance   She is taking Centrum Silver multivitamin with minerals. We emphasized balanced nutrition, including adequate protein intake to prevent muscle loss. We discussed protein-rich food options and convenient prepackaged healthy meals. We will continue Centrum Silver multivitamin with minerals, incorporate protein-rich foods such as lean meats, protein shakes, Greek yogurt, and protein-fortified milk into the diet, and consider prepackaged healthy meals like Healthy Choice bowls and microwavable egg sandwiches for convenience.  Follow-up   We will follow up in four weeks to monitor weight, nutritional status, and overall progress, refill semaglutide (Ozempic) prescription, and ensure proper usage instructions are followed.             Objective   Physical Exam:  Blood pressure 139/86, pulse 94, temperature 97.8 F (36.6 C), height 4\' 10"  (1.473 m), weight 193 lb (87.5 kg), SpO2 94%. Body mass index is 40.34 kg/m.  General: She is overweight, cooperative, alert, well developed, and in no acute distress. PSYCH: Has normal mood, affect and thought process.   HEENT: EOMI, sclerae are anicteric. Lungs: Normal breathing effort, no conversational dyspnea. Extremities: No edema.  Neurologic: No gross  sensory or motor deficits. No tremors or fasciculations noted.    Diagnostic Data Reviewed:  BMET    Component Value Date/Time   NA 138 05/03/2023 1235   K 4.0 05/03/2023 1235   CL 102 05/03/2023 1235   CO2 29 05/03/2023 1235   GLUCOSE 148 (H) 05/03/2023 1235   BUN 7 (L) 05/03/2023 1235   CREATININE 0.58 05/03/2023 1235   CREATININE 0.58 (L) 09/01/2020 1139   CALCIUM 8.8 (L) 05/03/2023 1235   GFRNONAA >60 05/03/2023 1235   GFRNONAA 93 09/01/2020 1139   GFRAA 107 09/01/2020 1139   Lab Results  Component Value Date   HGBA1C 5.9 (H) 05/22/2023   HGBA1C 5.6 09/01/2020   Lab Results  Component Value Date   INSULIN 5.0 05/22/2023   Lab Results  Component Value Date   TSH 0.918 11/09/2022   CBC    Component Value Date/Time   WBC 3.5 (L) 05/03/2023 1235   RBC 3.76 (L) 05/03/2023 1235   HGB 11.8 (L) 05/03/2023 1235   HCT 38.2 05/03/2023 1235   PLT 226 05/03/2023 1235   MCV 101.6 (H) 05/03/2023 1235   MCH 31.4 05/03/2023 1235   MCHC 30.9 05/03/2023 1235   RDW 15.1 05/03/2023 1235   Iron Studies    Component Value Date/Time   IRON 58 05/22/2023 0859   TIBC 370 05/22/2023 0859   FERRITIN 57 05/22/2023 0859   IRONPCTSAT 16 05/22/2023 0859   Lipid Panel     Component Value Date/Time   CHOL 216 (H) 05/22/2023 0859   TRIG 83 05/22/2023 0859   HDL 77 05/22/2023 0859   CHOLHDL 3 06/26/2022 1124   VLDL 23.2 06/26/2022 1124   LDLCALC 124 (H) 05/22/2023 0859   LDLCALC 110 (H) 09/01/2020 1139   Hepatic Function Panel     Component Value Date/Time   PROT 6.4 (L) 05/03/2023 1235   ALBUMIN 3.9 05/03/2023 1235   AST 15 05/03/2023 1235   ALT 11 05/03/2023 1235   ALKPHOS 53 05/03/2023 1235   BILITOT 0.6 05/03/2023 1235   BILIDIR 0.0 09/05/2016 0912      Component Value Date/Time   TSH 0.918 11/09/2022 0023   TSH 1.03 06/26/2022 1124   Nutritional Lab Results  Component Value Date   VD25OH 35.3 05/22/2023   VD25OH 54.11 08/25/2021   VD25OH 14.22 (L) 05/26/2021     Follow-Up   Return in about 4 weeks (around 09/18/2023) for For Weight Mangement with Dr. [name].Marland Kitchen She was informed of the importance of frequent follow up visits to maximize her success with intensive lifestyle modifications for her multiple health conditions.  Attestation Statement   Reviewed by clinician on day of visit: allergies, medications, problem list, medical history, surgical history, family history, social history, and previous encounter notes.     Worthy Rancher, MD

## 2023-08-28 DIAGNOSIS — M1990 Unspecified osteoarthritis, unspecified site: Secondary | ICD-10-CM | POA: Diagnosis not present

## 2023-08-28 DIAGNOSIS — M2550 Pain in unspecified joint: Secondary | ICD-10-CM | POA: Diagnosis not present

## 2023-08-28 DIAGNOSIS — Z862 Personal history of diseases of the blood and blood-forming organs and certain disorders involving the immune mechanism: Secondary | ICD-10-CM | POA: Diagnosis not present

## 2023-08-28 DIAGNOSIS — Z79899 Other long term (current) drug therapy: Secondary | ICD-10-CM | POA: Diagnosis not present

## 2023-08-28 DIAGNOSIS — M0579 Rheumatoid arthritis with rheumatoid factor of multiple sites without organ or systems involvement: Secondary | ICD-10-CM | POA: Diagnosis not present

## 2023-08-28 DIAGNOSIS — M810 Age-related osteoporosis without current pathological fracture: Secondary | ICD-10-CM | POA: Diagnosis not present

## 2023-09-11 ENCOUNTER — Ambulatory Visit (INDEPENDENT_AMBULATORY_CARE_PROVIDER_SITE_OTHER): Payer: Medicare PPO | Admitting: Neurology

## 2023-09-11 DIAGNOSIS — G4733 Obstructive sleep apnea (adult) (pediatric): Secondary | ICD-10-CM

## 2023-09-11 DIAGNOSIS — M542 Cervicalgia: Secondary | ICD-10-CM

## 2023-09-11 DIAGNOSIS — G4710 Hypersomnia, unspecified: Secondary | ICD-10-CM

## 2023-09-11 DIAGNOSIS — Z862 Personal history of diseases of the blood and blood-forming organs and certain disorders involving the immune mechanism: Secondary | ICD-10-CM

## 2023-09-11 DIAGNOSIS — R5382 Chronic fatigue, unspecified: Secondary | ICD-10-CM

## 2023-09-12 ENCOUNTER — Other Ambulatory Visit: Payer: Self-pay | Admitting: Family Medicine

## 2023-09-12 ENCOUNTER — Telehealth: Payer: Self-pay | Admitting: *Deleted

## 2023-09-12 DIAGNOSIS — M19072 Primary osteoarthritis, left ankle and foot: Secondary | ICD-10-CM | POA: Diagnosis not present

## 2023-09-12 DIAGNOSIS — R600 Localized edema: Secondary | ICD-10-CM | POA: Diagnosis not present

## 2023-09-12 DIAGNOSIS — I739 Peripheral vascular disease, unspecified: Secondary | ICD-10-CM | POA: Diagnosis not present

## 2023-09-12 DIAGNOSIS — J3089 Other allergic rhinitis: Secondary | ICD-10-CM

## 2023-09-12 DIAGNOSIS — B351 Tinea unguium: Secondary | ICD-10-CM | POA: Diagnosis not present

## 2023-09-12 DIAGNOSIS — M19071 Primary osteoarthritis, right ankle and foot: Secondary | ICD-10-CM | POA: Diagnosis not present

## 2023-09-12 DIAGNOSIS — M792 Neuralgia and neuritis, unspecified: Secondary | ICD-10-CM | POA: Diagnosis not present

## 2023-09-12 NOTE — Telephone Encounter (Signed)
Started Prior authorization for her Ozempic waiting on the questions to come up.

## 2023-09-12 NOTE — Progress Notes (Signed)
[facility]   HOME SLEEP TEST REPORT ( by Watch PAT)   STUDY DATE:  09-13-2023    ORDERING CLINICIAN: Melvyn Novas, MD  REFERRING CLINICIAN: Dr. Theodis Shove, Bariatric medicine  Primary Neurologist : Dr Terrace Arabia    CLINICAL INFORMATION/HISTORY: 07/13/2023 dr Terrace Arabia: Persistent left upper nuchal area pain, radiating pain to left occipital region  08-12-2013: [name] is a 72 y.o. female patient who is seen on 08/13/2023  for a sleep medicine consultation..  Chief concern according to patient :  " I have neck pain (  seen by Dr Terrace Arabia )  and rheumatoid arthritis, and struggle with weight issues. I have lost 20 pounds , am now at BMI 41, but I snore and had Asthma in the past.". This Patient has symptoms of sleep disordered breathing and many anatomical risk factors, STOP BANG 8/10 .  She was counseled on risks associated with undiagnosed / untreated sleep disorders by Dr. [name].Indeed, Dr Coldwell has high risk anatomy for OSA- high grade Mallampati, neck size and BMI. Lack of core strength, abdominal girth can contribute to hypoventilation.  She snores ( or used to)  and her late husband commented on that. She also has poor sleep routines.    Epworth sleepiness score:  11/ 24 points  FSS endorsed at 43/ 63 points.  GDS 2/ 15    BMI: 41 kg/m   Neck Circumference: 16"   FINDINGS:   Sleep Summary:   Total Recording Time (hours, min):    9 hours 54 minutes   Total Sleep Time (hours, min):   8 hours 6 minutes              Percent REM (%): 22.5                                      Respiratory Indices:   Calculated pAHI (per hour):    By AASM criteria the AHI is 28.9/h and by CMS criteria 17.2/h both qualify for moderate severe sleep apnea.  There was no central apnea noted.                         REM pAHI: 35/ hour                                                NREM pAHI:     12/h                         Positional AHI:   The patient slept the  majority of the night on her right side with an AHI of 20/h she also slept supine with an AHI of 10.3/h.  The majority of REM sleep was seen in right lateral position.                                                 Snoring: Snoring reached a mean volume of 59 dB which is unusually loud and severe.  This was present throughout the recorded sleep time.  Oxygen Saturation Statistics:  Oxygen Saturation (%) Mean:     92%              O2 Saturation Range (%):   Between the nadir at 73% with a maximum saturation of 98%                                    O2 Saturation (minutes) <89%:  30 minutes, the equivalent of 6% of total sleep time. O2 saturation in minutes< 90%: 40 minutes, the equivalent of 8.3% of total sleep time.  Oxygen desaturation events clustered during REM sleep.   Pulse Rate Statistics:   Pulse Mean (bpm): 98 bpm                Pulse Range:   Between 73 and 120 bpm,               IMPRESSION:  This HST confirms the presence of very severe snoring, hypoxemia, and moderate severe obstructive sleep apnea. This apnea is very strongly accentuated by REM sleep the AHI during REM sleep is almost 3 times as high as the AHI during non-REM sleep. REM sleep dependent apnea responds only to positive airway pressure therapy and the added hypoxia component excludes alternative treatment options such as a dental device or a hypoglossal nerve stimulator.   RECOMMENDATION: Autotitration CPAP device with a setting between 6 and 16 cm water pressure, 2 cm EPR, heated humidification and a mask of patient's comfort and fit.  In addition the patient was asked to implement sleep hygiene rules that were discussed in detail during her visit.  REM sleep dependent apnea improved for the long term by losing weight.  I hope she can continue on her journey to a healthier body mass index.    INTERPRETING PHYSICIAN:   Melvyn Novas, MD  [facility] and [facility] Sleep Board  certified by [employer] and Diplomate of the [employer] of [employer]. Board certified In Neurology through the ABPN, Fellow of the [employer] of [employer].

## 2023-09-13 ENCOUNTER — Telehealth (INDEPENDENT_AMBULATORY_CARE_PROVIDER_SITE_OTHER): Payer: Self-pay

## 2023-09-13 ENCOUNTER — Telehealth (INDEPENDENT_AMBULATORY_CARE_PROVIDER_SITE_OTHER): Payer: Self-pay | Admitting: Internal Medicine

## 2023-09-13 NOTE — Telephone Encounter (Signed)
Pt will be notified when PA is approved or denied, I let her know.

## 2023-09-13 NOTE — Telephone Encounter (Signed)
Prior authorization denied for patients Ozempic. Per her Insurance they do not cover medication for pre diabetes.

## 2023-09-13 NOTE — Telephone Encounter (Signed)
 Questions completed. Waiting on determination.

## 2023-09-13 NOTE — Telephone Encounter (Signed)
12/12 PT called said the Pharmacy need Prior Auth for Ozempic. [facility]

## 2023-09-13 NOTE — Telephone Encounter (Signed)
Let pt know she will be notified if PA is approved or denied

## 2023-09-18 ENCOUNTER — Ambulatory Visit (INDEPENDENT_AMBULATORY_CARE_PROVIDER_SITE_OTHER): Payer: Medicare PPO | Admitting: Internal Medicine

## 2023-09-18 ENCOUNTER — Encounter (INDEPENDENT_AMBULATORY_CARE_PROVIDER_SITE_OTHER): Payer: Self-pay | Admitting: Internal Medicine

## 2023-09-18 VITALS — BP 138/84 | HR 100 | Temp 98.0°F | Ht <= 58 in | Wt 197.0 lb

## 2023-09-18 DIAGNOSIS — R7303 Prediabetes: Secondary | ICD-10-CM

## 2023-09-18 DIAGNOSIS — E66813 Obesity, class 3: Secondary | ICD-10-CM | POA: Diagnosis not present

## 2023-09-18 DIAGNOSIS — T50905A Adverse effect of unspecified drugs, medicaments and biological substances, initial encounter: Secondary | ICD-10-CM

## 2023-09-18 DIAGNOSIS — R948 Abnormal results of function studies of other organs and systems: Secondary | ICD-10-CM

## 2023-09-18 DIAGNOSIS — Z6841 Body Mass Index (BMI) 40.0 and over, adult: Secondary | ICD-10-CM

## 2023-09-18 DIAGNOSIS — T50915D Adverse effect of multiple unspecified drugs, medicaments and biological substances, subsequent encounter: Secondary | ICD-10-CM

## 2023-09-18 NOTE — Assessment & Plan Note (Signed)
Most recent A1c is  Lab Results  Component Value Date   HGBA1C 5.9 (H) 05/22/2023   HGBA1C 5.6 09/01/2020    Patient aware of disease state and risk of progression. This may contribute to abnormal cravings, fatigue and diabetic complications without having diabetes.   She is also on several medications that promote weight.  We educated her on the carb insulin model and she is aware about the importance of maintaining a diet with a low glycemic load.  Patient has had progressive weight gain, she is also on obesogenic medications which increase her risk of diabetes progression.  She will benefit from pharmacoprophylaxis but because of chronic loose stools metformin is not an option.  She had been on semaglutide with good clinical response but medication has been declined by her insurance.

## 2023-09-18 NOTE — Assessment & Plan Note (Signed)
Patient has a slower than predicted metabolism. IC 1267 vs. calculated 1390.  Likely secondary to medications, chronic disease and low volume of physical activity.  This may contribute to weight gain, chronic fatigue and difficulty losing weight.  We reviewed measures to improve metabolism including not skipping meals, progressive strengthening exercises, increasing protein intake at every meal and maintaining adequate hydration and sleep.

## 2023-09-18 NOTE — Assessment & Plan Note (Signed)
 See obesity treatment plan

## 2023-09-18 NOTE — Assessment & Plan Note (Signed)
Patient on antiepileptics, antidepressants, steroids and immunomodulators.  She is not a good candidate for metformin because of chronic loose stools.  She was on semaglutide and was losing weight but medication has now been declined by her insurance.  Unfortunately in the presence of these needed medications she will continue to gain weight and will eventually develop diabetes without pharmacoprophylaxis.  This is the reason why we feel that she should continue GLP-1 therapy.  Patient was tolerating therapy well and was losing weight.

## 2023-09-18 NOTE — Progress Notes (Signed)
Office: [phone]  /  Fax: [phone]  Weight Summary And Biometrics  Vitals Temp: 98 F (36.7 C) BP: 138/84 Pulse Rate: 100 SpO2: 98 %   Anthropometric Measurements Height: 4\' 10"  (1.473 m) Weight: 197 lb (89.4 kg) BMI (Calculated): 41.18 Weight at Last Visit: 193 lb Weight Lost Since Last Visit: 0 lb Weight Gained Since Last Visit: 4 lb Starting Weight: 202 lb Total Weight Loss (lbs): 5 lb (2.268 kg) Peak Weight: 220 lb   Body Composition  Body Fat %: 57.1 % Fat Mass (lbs): 112.4 lbs Muscle Mass (lbs): 80.2 lbs Visceral Fat Rating : 20    RMR: [id number]  Today's Visit #: 5  Starting Date: 05/22/23   Subjective   Chief Complaint: Obesity  [name] is here to discuss her progress with her obesity treatment plan. She is on the the Category 1 Plan and states she is following her eating plan approximately 50 % of the time. She states she is exercising 30 minutes 3 times per week.  Interval History:   Discussed the use of AI scribe software for clinical note transcription with the patient, who gave verbal consent to proceed.  History of Present Illness   The patient is a 72 year old individual with a history of sarcoidosis, obesity, prediabetes, and abnormal metabolism, who presents for medical weight management. She reports weight gain associated with medication use. She has been on Ozempic, which was initially approved by her insurance, but a recent refill request was denied due to the patient's prediabetic status. The patient reports that she was intolerant to metformin, which was why Ozempic was prescribed as an alternative to reduce her risk of diabetes.  Since her last dose of Ozempic two weeks ago, the patient has noticed a return of her appetite, not at full scale, but significantly increased compared to when she was on the medication. She expresses disappointment at this change and attributes her previous weight loss to the appetite-suppressing effects of  Ozempic.  The patient is also on prednisone, Cymbalta, and gabapentin, all of which are known to cause weight gain. She reports no changes in these medications.  In terms of dietary habits, the patient notes an increase in consumption of foods high in simple carbohydrates, particularly during the holiday season. She mentions an increase in portion sizes and a shift towards foods that are not properly proportioned. She has been consuming dairy-free yogurt not [place] with high sugar and cereals that are not on the recommended list of foods. She also mentions having two meals per week prepared by others, with the rest being self-prepared, often including sandwiches.  The patient expresses a willingness to appeal the insurance decision regarding Ozempic, citing its beneficial effects on her weight management. She also expresses a willingness to make dietary changes, including considering protein smoothies and adjusting her carbohydrate intake.    Orexigenic Control:  Reports problems with appetite and hunger signals.  Reports problems with satiety and satiation.  Denies problems with eating patterns and portion control.  Denies abnormal cravings. Denies feeling deprived or restricted.   Barriers identified: low volume of physical activity at present , orthopedic problems, medical conditions or chronic pain affecting mobility, medical comorbidities, presence of obesogenic drugs, and slow metabolism for age.   Pharmacotherapy for weight loss: She is currently taking semaglutide but insurance has denied coverage.  Assessment and Plan   Treatment Plan For Obesity:  Recommended Dietary Goals  [name] is currently in the action stage of change. As such, her goal is  to continue weight management plan. She has agreed to: continue current plan  Behavioral Intervention  We discussed the following Behavioral Modification Strategies today: continue to work on maintaining a reduced calorie state, getting  the recommended amount of protein, incorporating whole foods, making healthy choices, staying well hydrated and practicing mindfulness when eating..  Additional resources provided today: Provided with personal guidance and instructions on how to use [employer] for healthy meal ideas and cooking in bulk.  Recommended Physical Activity Goals  [name] has been advised to work up to 150 minutes of moderate intensity aerobic activity a week and strengthening exercises 2-3 times per week for cardiovascular health, weight loss maintenance and preservation of muscle mass.   She has agreed to :  Think about enjoyable ways to increase daily physical activity and overcoming barriers to exercise and Increase physical activity in their day and reduce sedentary time (increase NEAT).  Pharmacotherapy  We discussed various medication options to help [name] with her weight loss efforts, she has contraindications to phentermine, topiramate, Contrave due to age and comorbid conditions.  She has multiple comorbidities associated with her obesity including prediabetes and is on weight promoting medications including prednisone which will increase her risk of progression.  We had recommended Ozempic for pharmacoprophylaxis because of loose stools and diarrhea associated with metformin.  Medication was effective but has been denied by her insurance recently.  Patient would like to appeal to insurance.  Associated Conditions Addressed Today  Abnormal metabolism Assessment & Plan: Patient has a slower than predicted metabolism. IC 1267 vs. calculated 1390.  Likely secondary to medications, chronic disease and low volume of physical activity.  This may contribute to weight gain, chronic fatigue and difficulty losing weight.   We reviewed measures to improve metabolism including not skipping meals, progressive strengthening exercises, increasing protein intake at every meal and maintaining adequate hydration and sleep.      Class 3 severe obesity with serious comorbidity and body mass index (BMI) of 40.0 to 44.9 in adult, unspecified obesity type [facility]) Assessment & Plan: See obesity treatment plan   Weight gain due to medication Assessment & Plan: Patient on antiepileptics, antidepressants, steroids and immunomodulators.  She is not a good candidate for metformin because of chronic loose stools.  She was on semaglutide and was losing weight but medication has now been declined by her insurance.  Unfortunately in the presence of these needed medications she will continue to gain weight and will eventually develop diabetes without pharmacoprophylaxis.  This is the reason why we feel that she should continue GLP-1 therapy.  Patient was tolerating therapy well and was losing weight.   Prediabetes Assessment & Plan: Most recent A1c is  Lab Results  Component Value Date   HGBA1C 5.9 (H) 05/22/2023   HGBA1C 5.6 09/01/2020    Patient aware of disease state and risk of progression. This may contribute to abnormal cravings, fatigue and diabetic complications without having diabetes.   She is also on several medications that promote weight.  We educated her on the carb insulin model and she is aware about the importance of maintaining a diet with a low glycemic load.  Patient has had progressive weight gain, she is also on obesogenic medications which increase her risk of diabetes progression.  She will benefit from pharmacoprophylaxis but because of chronic loose stools metformin is not an option.  She had been on semaglutide with good clinical response but medication has been declined by her insurance.  Objective   Physical Exam:  Blood pressure 138/84, pulse 100, temperature 98 F (36.7 C), height 4\' 10"  (1.473 m), weight 197 lb (89.4 kg), SpO2 98%. Body mass index is 41.17 kg/m.  General: She is overweight, cooperative, alert, well developed, and in no acute distress. PSYCH: Has normal  mood, affect and thought process.   HEENT: EOMI, sclerae are anicteric. Lungs: Normal breathing effort, no conversational dyspnea. Extremities: No edema.  Neurologic: No gross sensory or motor deficits. No tremors or fasciculations noted.    Diagnostic Data Reviewed:  BMET    Component Value Date/Time   NA 138 05/03/2023 1235   K 4.0 05/03/2023 1235   CL 102 05/03/2023 1235   CO2 29 05/03/2023 1235   GLUCOSE 148 (H) 05/03/2023 1235   BUN 7 (L) 05/03/2023 1235   CREATININE 0.58 05/03/2023 1235   CREATININE 0.58 (L) 09/01/2020 1139   CALCIUM 8.8 (L) 05/03/2023 1235   GFRNONAA >60 05/03/2023 1235   GFRNONAA 93 09/01/2020 1139   GFRAA 107 09/01/2020 1139   Lab Results  Component Value Date   HGBA1C 5.9 (H) 05/22/2023   HGBA1C 5.6 09/01/2020   Lab Results  Component Value Date   INSULIN 5.0 05/22/2023   Lab Results  Component Value Date   TSH 0.918 11/09/2022   CBC    Component Value Date/Time   WBC 3.5 (L) 05/03/2023 1235   RBC 3.76 (L) 05/03/2023 1235   HGB 11.8 (L) 05/03/2023 1235   HCT 38.2 05/03/2023 1235   PLT 226 05/03/2023 1235   MCV 101.6 (H) 05/03/2023 1235   MCH 31.4 05/03/2023 1235   MCHC 30.9 05/03/2023 1235   RDW 15.1 05/03/2023 1235   Iron Studies    Component Value Date/Time   IRON 58 05/22/2023 0859   TIBC 370 05/22/2023 0859   FERRITIN 57 05/22/2023 0859   IRONPCTSAT 16 05/22/2023 0859   Lipid Panel     Component Value Date/Time   CHOL 216 (H) 05/22/2023 0859   TRIG 83 05/22/2023 0859   HDL 77 05/22/2023 0859   CHOLHDL 3 06/26/2022 1124   VLDL 23.2 06/26/2022 1124   LDLCALC 124 (H) 05/22/2023 0859   LDLCALC 110 (H) 09/01/2020 1139   Hepatic Function Panel     Component Value Date/Time   PROT 6.4 (L) 05/03/2023 1235   ALBUMIN 3.9 05/03/2023 1235   AST 15 05/03/2023 1235   ALT 11 05/03/2023 1235   ALKPHOS 53 05/03/2023 1235   BILITOT 0.6 05/03/2023 1235   BILIDIR 0.0 09/05/2016 0912      Component Value Date/Time   TSH 0.918  11/09/2022 0023   TSH 1.03 06/26/2022 1124   Nutritional Lab Results  Component Value Date   VD25OH 35.3 05/22/2023   VD25OH 54.11 08/25/2021   VD25OH 14.22 (L) 05/26/2021    Follow-Up   Return in about 4 weeks (around 10/16/2023) for For Weight Mangement with Dr. [name].Marland Kitchen She was informed of the importance of frequent follow up visits to maximize her success with intensive lifestyle modifications for her multiple health conditions.  Attestation Statement   Reviewed by clinician on day of visit: allergies, medications, problem list, medical history, surgical history, family history, social history, and previous encounter notes.   I have spent 40 minutes in the care of the patient today including: preparing to see patient (e.g. review and interpretation of tests, old notes ), obtaining and/or reviewing separately obtained history, performing a medically appropriate examination or evaluation, counseling and educating the patient, documenting clinical  information in the electronic or other health care record, and independently interpreting results and communicating results to the patient, family, or caregiver   Worthy Rancher, MD

## 2023-09-21 DIAGNOSIS — G4733 Obstructive sleep apnea (adult) (pediatric): Secondary | ICD-10-CM | POA: Insufficient documentation

## 2023-09-21 NOTE — Procedures (Signed)
[facility]   HOME SLEEP TEST REPORT ( by Watch PAT)   STUDY DATE:  09-13-2023    ORDERING CLINICIAN: Melvyn Novas, MD  REFERRING CLINICIAN: Dr. Theodis Shove, Bariatric medicine  Primary Neurologist : Dr Terrace Arabia    CLINICAL INFORMATION/HISTORY: 07/13/2023 dr Terrace Arabia: Persistent left upper nuchal area pain, radiating pain to left occipital region  08-12-2013: [name] is a 72 y.o. female patient who is seen on 08/13/2023  for a sleep medicine consultation..  Chief concern according to patient :  " I have neck pain (  seen by Dr Terrace Arabia )  and rheumatoid arthritis, and struggle with weight issues. I have lost 20 pounds , am now at BMI 41, but I snore and had Asthma in the past.". This Patient has symptoms of sleep disordered breathing and many anatomical risk factors, STOP BANG 8/10 .  She was counseled on risks associated with undiagnosed / untreated sleep disorders by Dr. [name].Indeed, Dr Alleva has high risk anatomy for OSA- high grade Mallampati, neck size and BMI. Lack of core strength, abdominal girth can contribute to hypoventilation.  She snores ( or used to)  and her late husband commented on that. She also has poor sleep routines.    Epworth sleepiness score:  11/ 24 points  FSS endorsed at 43/ 63 points.  GDS 2/ 15    BMI: 41 kg/m   Neck Circumference: 16"   FINDINGS:   Sleep Summary:   Total Recording Time (hours, min):    9 hours 54 minutes   Total Sleep Time (hours, min):   8 hours 6 minutes              Percent REM (%): 22.5                                      Respiratory Indices:   Calculated pAHI (per hour):    By AASM criteria the AHI is 28.9/h and by CMS criteria 17.2/h both qualify for moderate severe sleep apnea.  There was no central apnea noted.                         REM pAHI: 35/ hour                                                NREM pAHI:     12/h                         Positional AHI:   The patient slept the majority  of the night on her right side with an AHI of 20/h she also slept supine with an AHI of 10.3/h.  The majority of REM sleep was seen in right lateral position.                                                 Snoring: Snoring reached a mean volume of 59 dB which is unusually loud and severe.  This was present throughout the recorded sleep time.  Oxygen Saturation Statistics:  Oxygen Saturation (%) Mean:     92%              O2 Saturation Range (%):   Between the nadir at 73% with a maximum saturation of 98%                                    O2 Saturation (minutes) <89%:  30 minutes, the equivalent of 6% of total sleep time. O2 saturation in minutes< 90%: 40 minutes, the equivalent of 8.3% of total sleep time.  Oxygen desaturation events clustered during REM sleep.   Pulse Rate Statistics:   Pulse Mean (bpm): 98 bpm                Pulse Range:   Between 73 and 120 bpm,               IMPRESSION:  This HST confirms the presence of very severe snoring, hypoxemia, and moderate severe obstructive sleep apnea. This apnea is very strongly accentuated by REM sleep the AHI during REM sleep is almost 3 times as high as the AHI during non-REM sleep. REM sleep dependent apnea responds only to positive airway pressure therapy and the added hypoxia component excludes alternative treatment options such as a dental device or a hypoglossal nerve stimulator.   RECOMMENDATION: Autotitration CPAP device with a setting between 6 and 16 cm water pressure, 2 cm EPR, heated humidification and a mask of patient's comfort and fit.  In addition the patient was asked to implement sleep hygiene rules that were discussed in detail during her visit.  REM sleep dependent apnea improved for the long term by losing weight.  I hope she can continue on her journey to a healthier body mass index.    INTERPRETING PHYSICIAN:   Melvyn Novas, MD  [facility] and [facility] Sleep Board certified by The  [employer] of Sleep Medicine and Diplomate of the [employer] of Sleep Medicine. Board certified In Neurology through the ABPN, Fellow of the [employer] of Neu[employer].

## 2023-10-04 NOTE — Telephone Encounter (Signed)
 I called pt. I advised pt that Dr. Dawayne reviewed their sleep study results and found that pt has moderate-sever OSA. Dr. Chalice recommends that pt starts cpap. I reviewed PAP compliance expectations with the pt. Pt is agreeable to starting a CPAP. I advised pt that an order will be sent to a DME, [employer] , and [employer] will call the pt within about one week after they file with the pt's insurance. [employer]  will show the pt how to use the machine, fit for masks, and troubleshoot the CPAP if needed. A follow up appt was made for insurance purposes with JM 12/18/23. Pt verbalized understanding to arrive 15 minutes early and bring their CPAP. Pt verbalized understanding of results. Pt had no questions at this time but was encouraged to call back if questions arise. I have sent the order to [employer] and have received confirmation that they have received the order.

## 2023-10-04 NOTE — Telephone Encounter (Addendum)
 [name], [name]  [name]; [name], [name]; [name], [name]; [name], [name]; [name], [name] New orders have been placed for the above pt, DOB: [birth year]. I believe notification was already sent to you all but I just wanted to make sure. Thanks  New, [name] [name], [name]; New, [name]; [name], [name]; [name], [name]; [name], [name]; 1 other Received, thank you!

## 2023-10-04 NOTE — Telephone Encounter (Signed)
 Pt has returned call to [facility], [place]

## 2023-10-04 NOTE — Telephone Encounter (Signed)
 Contacted pt regarding SSR, LVM rq cal back.

## 2023-10-11 ENCOUNTER — Other Ambulatory Visit: Payer: Self-pay | Admitting: Family Medicine

## 2023-10-11 DIAGNOSIS — G4733 Obstructive sleep apnea (adult) (pediatric): Secondary | ICD-10-CM | POA: Diagnosis not present

## 2023-10-11 DIAGNOSIS — J4541 Moderate persistent asthma with (acute) exacerbation: Secondary | ICD-10-CM

## 2023-10-23 ENCOUNTER — Ambulatory Visit (INDEPENDENT_AMBULATORY_CARE_PROVIDER_SITE_OTHER): Payer: Medicare PPO | Admitting: Internal Medicine

## 2023-11-10 ENCOUNTER — Other Ambulatory Visit: Payer: Self-pay | Admitting: Family Medicine

## 2023-11-11 DIAGNOSIS — G4733 Obstructive sleep apnea (adult) (pediatric): Secondary | ICD-10-CM | POA: Diagnosis not present

## 2023-11-14 ENCOUNTER — Ambulatory Visit (INDEPENDENT_AMBULATORY_CARE_PROVIDER_SITE_OTHER): Payer: Medicare PPO | Admitting: Internal Medicine

## 2023-11-15 DIAGNOSIS — H26491 Other secondary cataract, right eye: Secondary | ICD-10-CM | POA: Diagnosis not present

## 2023-11-15 DIAGNOSIS — H16223 Keratoconjunctivitis sicca, not specified as Sjogren's, bilateral: Secondary | ICD-10-CM | POA: Diagnosis not present

## 2023-11-15 DIAGNOSIS — H402213 Chronic angle-closure glaucoma, right eye, severe stage: Secondary | ICD-10-CM | POA: Diagnosis not present

## 2023-11-15 DIAGNOSIS — H402222 Chronic angle-closure glaucoma, left eye, moderate stage: Secondary | ICD-10-CM | POA: Diagnosis not present

## 2023-12-09 DIAGNOSIS — G4733 Obstructive sleep apnea (adult) (pediatric): Secondary | ICD-10-CM | POA: Diagnosis not present

## 2023-12-11 ENCOUNTER — Ambulatory Visit (INDEPENDENT_AMBULATORY_CARE_PROVIDER_SITE_OTHER): Payer: Medicare PPO | Admitting: Internal Medicine

## 2023-12-11 ENCOUNTER — Encounter (INDEPENDENT_AMBULATORY_CARE_PROVIDER_SITE_OTHER): Payer: Self-pay | Admitting: Internal Medicine

## 2023-12-11 DIAGNOSIS — E66813 Obesity, class 3: Secondary | ICD-10-CM

## 2023-12-11 DIAGNOSIS — R635 Abnormal weight gain: Secondary | ICD-10-CM

## 2023-12-11 DIAGNOSIS — Z6841 Body Mass Index (BMI) 40.0 and over, adult: Secondary | ICD-10-CM

## 2023-12-11 DIAGNOSIS — R29818 Other symptoms and signs involving the nervous system: Secondary | ICD-10-CM | POA: Diagnosis not present

## 2023-12-11 DIAGNOSIS — T50905A Adverse effect of unspecified drugs, medicaments and biological substances, initial encounter: Secondary | ICD-10-CM | POA: Diagnosis not present

## 2023-12-11 DIAGNOSIS — R7303 Prediabetes: Secondary | ICD-10-CM

## 2023-12-11 NOTE — Assessment & Plan Note (Signed)
 Patient on antiepileptics, antidepressants, steroids and immunomodulators.  She is not a good candidate for metformin because of chronic loose stools.  She was on semaglutide and was losing weight but medication has now been declined by her insurance.  Unfortunately in the presence of these needed medications she will continue to gain weight and will eventually develop diabetes without pharmacoprophylaxis.

## 2023-12-11 NOTE — Assessment & Plan Note (Signed)
 See obesity treatment plan

## 2023-12-11 NOTE — Assessment & Plan Note (Signed)
 Patient has symptoms of sleep disordered breathing and high risk phenotype. Counseled on risks associated with undiagnosed / untreated disorder. Recommend she have [facility] which she declines.  She feels she did not would not comply with CPAP treatment.  Losing 15% of BW may improve symptoms.

## 2023-12-11 NOTE — Progress Notes (Signed)
 Office: [phone]  /  Fax: [phone]  Weight Summary And Biometrics  Vitals Temp: 97.9 F (36.6 C) BP: 137/79 Pulse Rate: 87 SpO2: 98 %   Anthropometric Measurements Height: 4\' 10"  (1.473 m) Weight: 199 lb (90.3 kg) BMI (Calculated): 41.6 Weight at Last Visit: 197 lb Weight Lost Since Last Visit: 0 Weight Gained Since Last Visit: 2 lb Starting Weight: 202 lb Total Weight Loss (lbs): 3 lb (1.361 kg) Peak Weight: 223 lb   Body Composition  Body Fat %: 50.6 % Fat Mass (lbs): 100.8 lbs Muscle Mass (lbs): 93.6 lbs Visceral Fat Rating : 18    No data recorded Today's Visit #: 5  Starting Date: 05/22/23   Subjective   Chief Complaint: Obesity  [name] is here to discuss her progress with her obesity treatment plan. She is on the the Category 1 Plan and states she is following her eating plan approximately 50% % of the time. She states she is not exercising  Weight Progress Since Last Visit:  Patient presents after a period of absence.  Since last office visit she has gained 2 pounds. She reports suboptimal adherence She has been working on not skipping meals, increasing protein intake at every meal, drinking more water, and making healthier choices   Challenges affecting patient progress: cost of medication, having difficulty preparing healthy meals, having difficulty with meal prep and planning, having difficulty focusing on healthy eating, low volume of physical activity at present , orthopedic problems, medical conditions or chronic pain affecting mobility, medical comorbidities, slow metabolism for age, and menopause.   Orexigenic Control: Denies problems with appetite and hunger signals.  Denies problems with satiety and satiation.  Denies problems with eating patterns and portion control.  Denies abnormal cravings. Denies feeling deprived or restricted.   Pharmacotherapy for weight management: She is currently taking no anti-obesity medication and had  been on Ozempic for prediabetes but insurance has denied coverage for medication .   Assessment and Plan   Treatment Plan For Obesity:  Recommended Dietary Goals  [name] is currently in the action stage of change. As such, her goal is to continue weight management plan. She has agreed to: follow a tailored, multi-day, low carbohydrate, high protein plan targeting 1000 cal calories and 90  grams of protein per day  Behavioral Health and Counseling  We discussed the following behavioral modification strategies today: increasing lean protein intake to established goals, work on meal planning and preparation, and continue to work on maintaining a reduced calorie state, getting the recommended amount of protein, incorporating whole foods, making healthy choices, staying well hydrated and practicing mindfulness when eating..  Additional education and resources provided today: New meal plan  Recommended Physical Activity Goals  [name] has been advised to work up to 150 minutes of moderate intensity aerobic activity a week and strengthening exercises 2-3 times per week for cardiovascular health, weight loss maintenance and preservation of muscle mass.   She has agreed to :  Think about enjoyable ways to increase daily physical activity and overcoming barriers to exercise and Increase physical activity in their day and reduce sedentary time (increase NEAT).  Pharmacotherapy  We discussed various medication options to help [name] with her weight loss efforts and we both agreed to : continue with nutritional and behavioral strategies  Associated Conditions Impacted by Obesity Treatment  Suspected sleep apnea Assessment & Plan: Patient has symptoms of sleep disordered breathing and high risk phenotype. Counseled on risks associated with undiagnosed / untreated disorder. Recommend  she have PSMG which she declines.  She feels she did not would not comply with CPAP treatment.  Losing 15% of BW may  improve symptoms.        Class 3 severe obesity with serious comorbidity and body mass index (BMI) of 40.0 to 44.9 in adult, unspecified obesity type [facility]) Assessment & Plan: See obesity treatment plan   Weight gain due to medication Assessment & Plan: Patient on antiepileptics, antidepressants, steroids and immunomodulators.  She is not a good candidate for metformin because of chronic loose stools.  She was on semaglutide and was losing weight but medication has now been declined by her insurance.  Unfortunately in the presence of these needed medications she will continue to gain weight and will eventually develop diabetes without pharmacoprophylaxis.     Prediabetes Assessment & Plan: Most recent A1c is  Lab Results  Component Value Date   HGBA1C 5.9 (H) 05/22/2023   HGBA1C 5.6 09/01/2020    Patient aware of disease state and risk of progression. This may contribute to abnormal cravings, fatigue and diabetic complications without having diabetes.   She is also on several medications that promote weight.  We educated her on the carb insulin model and she is aware about the importance of maintaining a diet with a low glycemic load.  Patient has had progressive weight gain, she is also on obesogenic medications which increase her risk of diabetes progression.  She will benefit from pharmacoprophylaxis but because of chronic loose stools metformin is not an option.  She had been on semaglutide with good clinical response but medication has been declined by her insurance.  She was provided with a new low-carb high-protein meal plan she will work on trying to implement some of the principles.       Objective   Physical Exam:  Blood pressure 137/79, pulse 87, temperature 97.9 F (36.6 C), height 4\' 10"  (1.473 m), weight 199 lb (90.3 kg), SpO2 98%. Body mass index is 41.59 kg/m.  General: She is overweight, cooperative, alert, well developed, and in no acute  distress. PSYCH: Has normal mood, affect and thought process.   HEENT: EOMI, sclerae are anicteric. Lungs: Normal breathing effort, no conversational dyspnea. Extremities: No edema.  Neurologic: No gross sensory or motor deficits. No tremors or fasciculations noted.    Diagnostic Data Reviewed:  BMET    Component Value Date/Time   NA 138 05/03/2023 1235   K 4.0 05/03/2023 1235   CL 102 05/03/2023 1235   CO2 29 05/03/2023 1235   GLUCOSE 148 (H) 05/03/2023 1235   BUN 7 (L) 05/03/2023 1235   CREATININE 0.58 05/03/2023 1235   CREATININE 0.58 (L) 09/01/2020 1139   CALCIUM 8.8 (L) 05/03/2023 1235   GFRNONAA >60 05/03/2023 1235   GFRNONAA 93 09/01/2020 1139   GFRAA 107 09/01/2020 1139   Lab Results  Component Value Date   HGBA1C 5.9 (H) 05/22/2023   HGBA1C 5.6 09/01/2020   Lab Results  Component Value Date   INSULIN 5.0 05/22/2023   Lab Results  Component Value Date   TSH 0.918 11/09/2022   CBC    Component Value Date/Time   WBC 3.5 (L) 05/03/2023 1235   RBC 3.76 (L) 05/03/2023 1235   HGB 11.8 (L) 05/03/2023 1235   HCT 38.2 05/03/2023 1235   PLT 226 05/03/2023 1235   MCV 101.6 (H) 05/03/2023 1235   MCH 31.4 05/03/2023 1235   MCHC 30.9 05/03/2023 1235   RDW 15.1 05/03/2023 1235   Iron  Studies    Component Value Date/Time   IRON 58 05/22/2023 0859   TIBC 370 05/22/2023 0859   FERRITIN 57 05/22/2023 0859   IRONPCTSAT 16 05/22/2023 0859   Lipid Panel     Component Value Date/Time   CHOL 216 (H) 05/22/2023 0859   TRIG 83 05/22/2023 0859   HDL 77 05/22/2023 0859   CHOLHDL 3 06/26/2022 1124   VLDL 23.2 06/26/2022 1124   LDLCALC 124 (H) 05/22/2023 0859   LDLCALC 110 (H) 09/01/2020 1139   Hepatic Function Panel     Component Value Date/Time   PROT 6.4 (L) 05/03/2023 1235   ALBUMIN 3.9 05/03/2023 1235   AST 15 05/03/2023 1235   ALT 11 05/03/2023 1235   ALKPHOS 53 05/03/2023 1235   BILITOT 0.6 05/03/2023 1235   BILIDIR 0.0 09/05/2016 0912      Component  Value Date/Time   TSH 0.918 11/09/2022 0023   TSH 1.03 06/26/2022 1124   Nutritional Lab Results  Component Value Date   VD25OH 35.3 05/22/2023   VD25OH 54.11 08/25/2021   VD25OH 14.22 (L) 05/26/2021    Follow-Up   Return in about 6 weeks (around 01/22/2024) for Virtual, has transporatation issues.. She was informed of the importance of frequent follow up visits to maximize her success with intensive lifestyle modifications for her multiple health conditions.  Attestation Statement   Reviewed by clinician on day of visit: allergies, medications, problem list, medical history, surgical history, family history, social history, and previous encounter notes.     Worthy Rancher, MD

## 2023-12-11 NOTE — Assessment & Plan Note (Signed)
 Most recent A1c is  Lab Results  Component Value Date   HGBA1C 5.9 (H) 05/22/2023   HGBA1C 5.6 09/01/2020    Patient aware of disease state and risk of progression. This may contribute to abnormal cravings, fatigue and diabetic complications without having diabetes.   She is also on several medications that promote weight.  We educated her on the carb insulin model and she is aware about the importance of maintaining a diet with a low glycemic load.  Patient has had progressive weight gain, she is also on obesogenic medications which increase her risk of diabetes progression.  She will benefit from pharmacoprophylaxis but because of chronic loose stools metformin is not an option.  She had been on semaglutide with good clinical response but medication has been declined by her insurance.  She was provided with a new low-carb high-protein meal plan she will work on trying to implement some of the principles.

## 2023-12-12 ENCOUNTER — Other Ambulatory Visit: Payer: Self-pay | Admitting: Family Medicine

## 2023-12-12 DIAGNOSIS — J3089 Other allergic rhinitis: Secondary | ICD-10-CM

## 2023-12-14 ENCOUNTER — Other Ambulatory Visit: Payer: Self-pay | Admitting: Family Medicine

## 2023-12-14 DIAGNOSIS — J4541 Moderate persistent asthma with (acute) exacerbation: Secondary | ICD-10-CM

## 2023-12-17 ENCOUNTER — Telehealth: Payer: Self-pay | Admitting: Neurology

## 2023-12-17 NOTE — Telephone Encounter (Signed)
 Pt has called to cancel her initial CPAP f/u. Before rescheduling pt is asking RN to call her to ensure she is scheduled in time to be compliant, pt states there was about a month that she did not use machine due to faulty equipment, please call pt to discuss.

## 2023-12-17 NOTE — Progress Notes (Deleted)
 [facility] [address] [place].  [address] [phone]       OFFICE FOLLOW UP NOTE  [name] Date of Birth:  [birth year] Medical Record Number:  [id number]    Primary neurologist: *** Reason for visit: Initial CPAP follow-up    SUBJECTIVE:   CHIEF COMPLAINT:  No chief complaint on file.   Follow-up visit:  Prior visit: 08/13/2023 Dr. Vickey Castillo  Brief HPI:   [name] is a 73 y.o. female who was evaluated by Dr. Vickey Castillo in 08/2023 for concern of sleep apnea.  ESS 11/24.  HST 09/2023 showed moderate to severe OSA accentuated by REM sleep and O2 nadir 73%.  Auto CPAP initiated 10/2023.       Interval history:        ROS:   14 system review of systems performed and negative with exception of those listed in HPI  PMH:  Past Medical History:  Diagnosis Date   Acute bronchitis    Allergy    Anemia    Anxiety    Arthritis    knees   Asthma    Back pain    Backache, unspecified    Cataract    removed both eyes   Depression    Diverticulitis    Diverticulosis of colon (without mention of hemorrhage)    Edema    GERD (gastroesophageal reflux disease)    Glaucoma    Joint pain    Lactose intolerance    Lumbago    Neuropathy    Obesity    Osteoarthritis    Other chest pain    Pain in joint, ankle and foot    Rheumatoid arthritis (HCC)    Sarcoidosis    SOB (shortness of breath)    Tibialis tendinitis    Unspecified asthma(493.90)     PSH:  Past Surgical History:  Procedure Laterality Date   ABDOMINAL HYSTERECTOMY     CATARACT EXTRACTION W/PHACO Right 06/24/2014   Procedure: CATARACT EXTRACTION PHACO AND INTRAOCULAR LENS PLACEMENT (IOC) RIGHT EYE WITH GONIOSYNECHIALYSIS;  Surgeon: Chalmers Guest, MD;  Location: [facility] OR;  Service: Ophthalmology;  Laterality: Right;   COLONOSCOPY     EYE SURGERY Left    cataract surgery   HAND SURGERY Right    trigger finger release   IR RADIOLOGIST EVAL & MGMT   11/14/2018   KNEE ARTHROSCOPY Right    TOTAL KNEE ARTHROPLASTY Right 12/08/2021   Procedure: RIGHT TOTAL KNEE ARTHROPLASTY APPLICATION OF WOUND VAC;  Surgeon: Cammy Copa, MD;  Location: [facility] OR;  Service: Orthopedics;  Laterality: Right;   TOTAL KNEE ARTHROPLASTY Left 04/13/2022   Procedure: LEFT TOTAL KNEE ARTHROPLASTY;  Surgeon: Cammy Copa, MD;  Location: [facility] OR;  Service: Orthopedics;  Laterality: Left;    Social History:  Social History   Socioeconomic History   Marital status: Widowed    Spouse name: Not on file   Number of children: 0   Years of education: Not on file   Highest education level: Professional school degree (e.g., MD, DDS, DVM, JD)  Occupational History   Occupation: Pensions consultant (semi-retired)  Tobacco Use   Smoking status: Never   Smokeless tobacco: Never  Vaping Use   Vaping status: Never Used  Substance and Sexual Activity   Alcohol use: Yes    Comment: 1 glass of wine or mixed drink once a week   Drug use: No   Sexual activity: Not on file  Other Topics Concern   Not on file  Social History Narrative   Not on file   Social Drivers of Health   Financial Resource Strain: Low Risk  (08/05/2023)   Overall Financial Resource Strain (CARDIA)    Difficulty of Paying Living Expenses: Not hard at all  Food Insecurity: No Food Insecurity (08/05/2023)   Hunger Vital Sign    Worried About Running Out of Food in the Last Year: Never true    Ran Out of Food in the Last Year: Never true  Transportation Needs: Unmet Transportation Needs (08/05/2023)   PRAPARE - Administrator, Civil Service (Medical): Yes    Lack of Transportation (Non-Medical): Yes  Physical Activity: Insufficiently Active (08/05/2023)   Exercise Vital Sign    Days of Exercise per Week: 4 days    Minutes of Exercise per Session: 20 min  Stress: Stress Concern Present (08/05/2023)   [employer] of Occupational Health - Occupational Stress Questionnaire    Feeling of  Stress : To some extent  Social Connections: Moderately Integrated (08/05/2023)   Social Connection and Isolation Panel [NHANES]    Frequency of Communication with Friends and Family: More than three times a week    Frequency of Social Gatherings with Friends and Family: Twice a week    Attends Religious Services: More than 4 times per year    Active Member of [employer] or Organizations: Yes    Attends Banker Meetings: More than 4 times per year    Marital Status: Widowed  Intimate Partner Violence: Not At Risk (07/25/2023)   Humiliation, Afraid, Rape, and Kick questionnaire    Fear of Current or Ex-Partner: No    Emotionally Abused: No    Physically Abused: No    Sexually Abused: No    Family History:  Family History  Problem Relation Age of Onset   Hypertension Mother    Stroke Mother    Depression Mother    Anxiety disorder Mother    Obesity Mother    Diabetes Father    Sarcoidosis Sister    Colon cancer Neg Hx    Colon polyps Neg Hx    Esophageal cancer Neg Hx    Rectal cancer Neg Hx    Stomach cancer Neg Hx     Medications:   Current Outpatient Medications on File Prior to Visit  Medication Sig Dispense Refill   acetaminophen (TYLENOL) 500 MG tablet Take 1,000 mg by mouth in the morning and at bedtime.     albuterol (VENTOLIN HFA) 108 (90 Base) MCG/ACT inhaler TAKE 2 PUFFS BY MOUTH EVERY 6 HOURS AS NEEDED (Patient taking differently: Inhale 2 puffs into the lungs every 6 (six) hours as needed for wheezing or shortness of breath. TAKE 2 PUFFS BY MOUTH EVERY 6 HOURS AS NEEDED) 18 g 3   alendronate (FOSAMAX) 70 MG tablet Take 70 mg by mouth every Monday.     amLODipine (NORVASC) 5 MG tablet Take 1 tablet (5 mg total) by mouth daily. 90 tablet 0   amoxicillin (AMOXIL) 500 MG capsule Take 2,000 mg by mouth See admin instructions. Take 2000mg  by mouth 1 hour before appt.     aspirin EC 325 MG tablet Take 325 mg by mouth daily.     Blood Pressure Monitor MISC 1  Device by Does not apply route daily. 1 each 0   Cholecalciferol (VITAMIN D3) 50 MCG (2000 UT) capsule      cyanocobalamin (VITAMIN B12) 1000 MCG/ML injection Inject 1,000 mcg into the muscle every 30 (thirty) days.  cycloSPORINE (RESTASIS) 0.05 % ophthalmic emulsion Place 1 drop into both eyes 2 (two) times daily.     DULoxetine (CYMBALTA) 30 MG capsule Take 1 capsule (30 mg total) by mouth daily. 30 capsule 1   fexofenadine-pseudoephedrine (ALLEGRA-D ALLERGY & CONGESTION) 60-120 MG 12 hr tablet Take 1 tablet by mouth every morning.     fluticasone (FLONASE) 50 MCG/ACT nasal spray Place 1 spray into both nostrils daily. 16 g 3   fluticasone-salmeterol (ADVAIR) 250-50 MCG/ACT AEPB INHALE 1 PUFF BY MOUTH TWICE A DAY Strength: 250-50 MCG/ACT 120 each 0   folic acid (FOLVITE) 1 MG tablet TAKE ONE TABLET BY MOUTH EVERY DAY 90 tablet 0   gabapentin (NEURONTIN) 300 MG capsule Take 1 capsule (300 mg total) by mouth 2 (two) times daily. 180 capsule 3   IYUZEH 0.005 % SOLN SMARTSIG:1 Drop(s) In Eye(s) Every Evening     KEVZARA 200 MG/1. SOAJ Inject into the skin.     Lactase (LACTAID FAST ACT) 9000 units CHEW Chew by mouth. Dosage unknown     leflunomide (ARAVA) 20 MG tablet Take 20 mg by mouth daily.     levocetirizine (XYZAL) 5 MG tablet TAKE ONE TABLET BY MOUTH EVERY IN THE EVENING 30 tablet 1   LUMIGAN 0.01 % SOLN SMARTSIG:1 Drop(s) In Eye(s) Every Evening     meloxicam (MOBIC) 15 MG tablet Take 1 tablet (15 mg total) by mouth daily. 30 tablet 0   Multiple Vitamins-Minerals (MULTIVITAMIN) tablet Take 1 tablet by mouth daily.     pantoprazole (PROTONIX) 40 MG tablet Take 1 tablet (40 mg total) by mouth daily. 30 tablet 0   polyethylene glycol powder (GLYCOLAX/MIRALAX) 17 GM/SCOOP powder Take 17 g by mouth daily as needed. 225 g 0   predniSONE (DELTASONE) 5 MG tablet Take 1 tablet (5 mg total) by mouth daily. 30 tablet 0   RHOPRESSA 0.02 % SOLN Ophthalmic for 56 Days     RHOPRESSA 0.02 % SOLN  Apply 1 drop to eye at bedtime.     Semaglutide,0.25 or 0.5MG /DOS, (OZEMPIC, 0.25 OR 0.5 MG/DOSE,) 2 MG/3ML SOPN Inject 0.25 mg into the skin once a week. 3 mL 0   senna-docusate (SENOKOT-S) 8.6-50 MG tablet Take 1 tablet by mouth daily. 30 tablet 2   Current Facility-Administered Medications on File Prior to Visit  Medication Dose Route Frequency Provider Last Rate Last Admin   cyanocobalamin (VITAMIN B12) injection 1,000 mcg  1,000 mcg Intramuscular Once         Allergies:   Allergies  Allergen Reactions   Invanz [Ertapenem] Swelling    "Lips swelled up"   Ibuprofen Rash   Sulfa Antibiotics Rash      OBJECTIVE:  Physical Exam  There were no vitals filed for this visit. There is no height or weight on file to calculate BMI. No results found.   General: well developed, well nourished, seated, in no evident distress Head: head normocephalic and atraumatic.   Neck: supple with no carotid or supraclavicular bruits Cardiovascular: regular rate and rhythm, no murmurs Musculoskeletal: no deformity Skin:  no rash/petichiae Vascular:  Normal pulses all extremities   Neurologic Exam Mental Status: Awake and fully alert. Oriented to place and time. Recent and remote memory intact. Attention span, concentration and fund of knowledge appropriate. Mood and affect appropriate.  Cranial Nerves: Pupils equal, briskly reactive to light. Extraocular movements full without nystagmus. Visual fields full to confrontation. Hearing intact. Facial sensation intact. Face, tongue, palate moves normally and symmetrically.  Motor: Normal  bulk and tone. Normal strength in all tested extremity muscles Gait and Station: Arises from chair without difficulty. Stance is normal. Gait demonstrates normal stride length and balance without use of AD. Tandem walk and heel toe without difficulty.         ASSESSMENT/PLAN: [name] is a 73 y.o. year old female    OSA on CPAP : Compliance report  shows satisfactory usage with optimal residual AHI.  Continue current pressure settings.  Discussed continued nightly usage with ensuring greater than 4 hours nightly for optimal benefit and per insurance purposes.  Continue to follow with DME company for any needed supplies or CPAP related concerns     Follow up in *** or call earlier if needed   CC:  PCP: Deeann Saint, MD    I spent *** minutes of face-to-face and non-face-to-face time with patient.  This included previsit chart review, lab review, study review, order entry, electronic health record documentation, patient education and discussion regarding above diagnoses and treatment plan and answered all other questions to patient's satisfaction    Ihor Austin, [facility]  [facility] [address] [place], [place] [address]  Phone [phone] Fax [phone] Note: This document was prepared with digital dictation and possible smart phrase technology. Any transcriptional errors that result from this process are unintentional.

## 2023-12-17 NOTE — Telephone Encounter (Signed)
 Called the pt back. Patient states that she was very frustrate because there was extensive period of time where she wasn't getting much help from the DME company. She wanted to switch but insurance wouldn't allow her. She states that she will start reusing it now and I have scheduled her for the very last min 4/7. She was compliant initially but because the machine had a issues and was waiting on adapt to fix it there was a gap not used. Pt will start back using it and advised to use it every night over 4 hrs between now and her apt and if she still not meeting criteria can make sure documented there was an issue with adapt

## 2023-12-18 ENCOUNTER — Encounter: Payer: Medicare PPO | Admitting: Adult Health

## 2023-12-19 ENCOUNTER — Telehealth (INDEPENDENT_AMBULATORY_CARE_PROVIDER_SITE_OTHER): Admitting: Family Medicine

## 2023-12-19 DIAGNOSIS — M069 Rheumatoid arthritis, unspecified: Secondary | ICD-10-CM

## 2023-12-19 DIAGNOSIS — J3089 Other allergic rhinitis: Secondary | ICD-10-CM | POA: Diagnosis not present

## 2023-12-19 DIAGNOSIS — I10 Essential (primary) hypertension: Secondary | ICD-10-CM

## 2023-12-19 DIAGNOSIS — Z862 Personal history of diseases of the blood and blood-forming organs and certain disorders involving the immune mechanism: Secondary | ICD-10-CM | POA: Diagnosis not present

## 2023-12-19 MED ORDER — AMLODIPINE BESYLATE 5 MG PO TABS: 5.0000 mg | ORAL_TABLET | Freq: Every day | ORAL | 3 refills | Status: AC

## 2023-12-19 MED ORDER — PREDNISONE 5 MG PO TABS: 5.0000 mg | ORAL_TABLET | Freq: Every day | ORAL | 1 refills | Status: AC

## 2023-12-19 MED ORDER — FEXOFENADINE HCL 180 MG PO TABS: 180.0000 mg | ORAL_TABLET | Freq: Every day | ORAL | 3 refills | Status: AC

## 2023-12-19 NOTE — Progress Notes (Signed)
 Virtual Visit via Video Note  I connected with [name] on 12/19/23 at  9:30 AM EDT by a video enabled telemedicine application and verified that I am speaking with the correct person using two identifiers.  Location patient: home Location provider:work or home office Persons participating in the virtual visit: patient, provider  I discussed the limitations of evaluation and management by telemedicine and the availability of in person appointments. The patient expressed understanding and agreed to proceed. Chief Complaint  Patient presents with   Medication Refill    Requesting refills.   HPI: Pt is a 73 yo female seen for f/u and med reconciliation.  Pt not sure what she needs refills on. Using blister packs for meds as they are easier to open than bottles due to rheumatoid arthritis/sarcoidosis.  Needs refill on prednisone 5 mg daily, written by Rheum.  Still having rhinorrhea in am despite taking pain.  Previously on Allegra-D which seemed to help.  Thinks she needs amlodipine.     ROS: See pertinent positives and negatives per HPI.  Past Medical History:  Diagnosis Date   Acute bronchitis    Allergy    Anemia    Anxiety    Arthritis    knees   Asthma    Back pain    Backache, unspecified    Cataract    removed both eyes   Depression    Diverticulitis    Diverticulosis of colon (without mention of hemorrhage)    Edema    GERD (gastroesophageal reflux disease)    Glaucoma    Joint pain    Lactose intolerance    Lumbago    Neuropathy    Obesity    Osteoarthritis    Other chest pain    Pain in joint, ankle and foot    Rheumatoid arthritis (HCC)    Sarcoidosis    SOB (shortness of breath)    Tibialis tendinitis    Unspecified asthma(493.90)     Past Surgical History:  Procedure Laterality Date   ABDOMINAL HYSTERECTOMY     CATARACT EXTRACTION W/PHACO Right 06/24/2014   Procedure: CATARACT EXTRACTION PHACO AND INTRAOCULAR LENS PLACEMENT (IOC) RIGHT EYE WITH  GONIOSYNECHIALYSIS;  Surgeon: Chalmers Guest, MD;  Location: [facility] OR;  Service: Ophthalmology;  Laterality: Right;   COLONOSCOPY     EYE SURGERY Left    cataract surgery   HAND SURGERY Right    trigger finger release   IR RADIOLOGIST EVAL & MGMT  11/14/2018   KNEE ARTHROSCOPY Right    TOTAL KNEE ARTHROPLASTY Right 12/08/2021   Procedure: RIGHT TOTAL KNEE ARTHROPLASTY APPLICATION OF WOUND VAC;  Surgeon: Cammy Copa, MD;  Location: [facility] OR;  Service: Orthopedics;  Laterality: Right;   TOTAL KNEE ARTHROPLASTY Left 04/13/2022   Procedure: LEFT TOTAL KNEE ARTHROPLASTY;  Surgeon: Cammy Copa, MD;  Location: [facility] OR;  Service: Orthopedics;  Laterality: Left;    Family History  Problem Relation Age of Onset   Hypertension Mother    Stroke Mother    Depression Mother    Anxiety disorder Mother    Obesity Mother    Diabetes Father    Sarcoidosis Sister    Colon cancer Neg Hx    Colon polyps Neg Hx    Esophageal cancer Neg Hx    Rectal cancer Neg Hx    Stomach cancer Neg Hx     Current Outpatient Medications:    acetaminophen (TYLENOL) 500 MG tablet, Take 1,000 mg by mouth in the morning and  at bedtime., Disp: , Rfl:    albuterol (VENTOLIN HFA) 108 (90 Base) MCG/ACT inhaler, TAKE 2 PUFFS BY MOUTH EVERY 6 HOURS AS NEEDED (Patient taking differently: Inhale 2 puffs into the lungs every 6 (six) hours as needed for wheezing or shortness of breath. TAKE 2 PUFFS BY MOUTH EVERY 6 HOURS AS NEEDED), Disp: 18 g, Rfl: 3   alendronate (FOSAMAX) 70 MG tablet, Take 70 mg by mouth every Monday., Disp: , Rfl:    amLODipine (NORVASC) 5 MG tablet, Take 1 tablet (5 mg total) by mouth daily., Disp: 90 tablet, Rfl: 0   amoxicillin (AMOXIL) 500 MG capsule, Take 2,000 mg by mouth See admin instructions. Take 2000mg  by mouth 1 hour before appt., Disp: , Rfl:    aspirin EC 325 MG tablet, Take 325 mg by mouth daily., Disp: , Rfl:    Blood Pressure Monitor MISC, 1 Device by Does not apply route daily., Disp: 1  each, Rfl: 0   Cholecalciferol (VITAMIN D3) 50 MCG (2000 UT) capsule, , Disp: , Rfl:    cyanocobalamin (VITAMIN B12) 1000 MCG/ML injection, Inject 1,000 mcg into the muscle every 30 (thirty) days., Disp: , Rfl:    cycloSPORINE (RESTASIS) 0.05 % ophthalmic emulsion, Place 1 drop into both eyes 2 (two) times daily., Disp: , Rfl:    fexofenadine-pseudoephedrine (ALLEGRA-D ALLERGY & CONGESTION) 60-120 MG 12 hr tablet, Take 1 tablet by mouth every morning., Disp: , Rfl:    fluticasone (FLONASE) 50 MCG/ACT nasal spray, Place 1 spray into both nostrils daily., Disp: 16 g, Rfl: 3   fluticasone-salmeterol (ADVAIR) 250-50 MCG/ACT AEPB, INHALE ONE PUFF into THE lungs TWICE DAILY, Disp: 120 each, Rfl: 0   folic acid (FOLVITE) 1 MG tablet, TAKE ONE TABLET BY MOUTH EVERY DAY, Disp: 90 tablet, Rfl: 0   gabapentin (NEURONTIN) 300 MG capsule, Take 1 capsule (300 mg total) by mouth 2 (two) times daily., Disp: 180 capsule, Rfl: 3   IYUZEH 0.005 % SOLN, SMARTSIG:1 Drop(s) In Eye(s) Every Evening, Disp: , Rfl:    KEVZARA 200 MG/1. SOAJ, Inject into the skin., Disp: , Rfl:    Lactase (LACTAID FAST ACT) 9000 units CHEW, Chew by mouth. Dosage unknown, Disp: , Rfl:    leflunomide (ARAVA) 20 MG tablet, Take 20 mg by mouth daily., Disp: , Rfl:    levocetirizine (XYZAL) 5 MG tablet, TAKE ONE TABLET BY MOUTH EVERY IN THE EVENING, Disp: 30 tablet, Rfl: 1   LUMIGAN 0.01 % SOLN, SMARTSIG:1 Drop(s) In Eye(s) Every Evening, Disp: , Rfl:    meloxicam (MOBIC) 15 MG tablet, Take 1 tablet (15 mg total) by mouth daily., Disp: 30 tablet, Rfl: 0   Multiple Vitamins-Minerals (MULTIVITAMIN) tablet, Take 1 tablet by mouth daily., Disp: , Rfl:    polyethylene glycol powder (GLYCOLAX/MIRALAX) 17 GM/SCOOP powder, Take 17 g by mouth daily as needed., Disp: 225 g, Rfl: 0   predniSONE (DELTASONE) 5 MG tablet, Take 1 tablet (5 mg total) by mouth daily., Disp: 30 tablet, Rfl: 0   RHOPRESSA 0.02 % SOLN, Ophthalmic for 56 Days, Disp: , Rfl:     RHOPRESSA 0.02 % SOLN, Apply 1 drop to eye at bedtime., Disp: , Rfl:    senna-docusate (SENOKOT-S) 8.6-50 MG tablet, Take 1 tablet by mouth daily., Disp: 30 tablet, Rfl: 2   DULoxetine (CYMBALTA) 30 MG capsule, Take 1 capsule (30 mg total) by mouth daily., Disp: 30 capsule, Rfl: 1   pantoprazole (PROTONIX) 40 MG tablet, Take 1 tablet (40 mg total) by mouth daily.,  Disp: 30 tablet, Rfl: 0  Current Facility-Administered Medications:    cyanocobalamin (VITAMIN B12) injection 1,000 mcg, 1,000 mcg, Intramuscular, Once,   EXAM:  VITALS per patient if applicable: RR between 12-20 bpm  GENERAL: alert, oriented, appears well and in no acute distress  HEENT: atraumatic, conjunctiva clear, no obvious abnormalities on inspection of external nose and ears  NECK: normal movements of the head and neck  LUNGS: on inspection no signs of respiratory distress, breathing rate appears normal, no obvious gross SOB, gasping or wheezing  CV: no obvious cyanosis  MS: moves all visible extremities without noticeable abnormality  PSYCH/NEURO: pleasant and cooperative, no obvious depression or anxiety, speech and thought processing grossly intact  ASSESSMENT AND PLAN:  Discussed the following assessment and plan:  Essential hypertension - Plan: amLODipine (NORVASC) 5 MG tablet  Environmental and seasonal allergies - Plan: fexofenadine (ALLEGRA) 180 MG tablet  Rheumatoid arthritis involving multiple sites, unspecified whether rheumatoid factor present (HCC) - Plan: predniSONE (DELTASONE) 5 MG tablet  Hx of sarcoidosis  Medications reviewed with patient.  Norvasc 5 mg refilled.  Continue lifestyle modifications.  As Xyzal does not seem to be effective, will d/c.  Start Allegra as may have been helpful in the past.  Patient requesting refill on prednisone.  Followed by rheumatology for RA.  Prednisone sent to pharmacy as a courtesy.  Follow-up in the next 4-6 months, sooner if needed.   I discussed the  assessment and treatment plan with the patient. The patient was provided an opportunity to ask questions and all were answered. The patient agreed with the plan and demonstrated an understanding of the instructions.   The patient was advised to call back or seek an in-person evaluation if the symptoms worsen or if the condition fails to improve as anticipated.   Deeann Saint, MD

## 2023-12-19 NOTE — Progress Notes (Signed)
 Patient was unable to self-report due to a lack of equipment at home via telehealth

## 2024-01-03 ENCOUNTER — Encounter: Payer: Self-pay | Admitting: *Deleted

## 2024-01-03 NOTE — Progress Notes (Signed)
 [name]

## 2024-01-03 NOTE — Patient Instructions (Addendum)
 Please continue using your CPAP regularly. While your insurance requires that you use CPAP at least 4 hours each night on 70% of the nights, I recommend, that you not skip any nights and use it throughout the night if you can. Getting used to CPAP and staying with the treatment long term does take time and patience and discipline. Untreated obstructive sleep apnea when it is moderate to severe can have an adverse impact on cardiovascular health and raise her risk for heart disease, arrhythmias, hypertension, congestive heart failure, stroke and diabetes. Untreated obstructive sleep apnea causes sleep disruption, nonrestorative sleep, and sleep deprivation. This can have an impact on your day to day functioning and cause daytime sleepiness and impairment of cognitive function, memory loss, mood disturbance, and problems focussing. Using CPAP regularly can improve these symptoms.  Please contact [facility] if you do not receive your replacement mask in the next day or so. Let your PCP know about the swelling in your ankles and continue close follow up with rheumatology for neck pain.   Follow up in 4 months

## 2024-01-03 NOTE — Progress Notes (Signed)
 PATIENT: [name] DOB: 08-26-1951  REASON FOR VISIT: follow up HISTORY FROM: patient  Virtual Visit via MyChart video  I connected with [name] on 01/07/24 at  8:30 AM EDT via MyChart video and verified that I am speaking with the correct person using two identifiers.   I discussed the limitations, risks, security and privacy concerns of performing an evaluation and management service by Mychart video and the availability of in person appointments. I also discussed with the patient that there may be a patient responsible charge related to this service. The patient expressed understanding and agreed to proceed.   History of Present Illness:  Dr Terrace Arabia: neck pain Dr Dohmeier: sleep  01/07/24 ALL: [name] is a 73 y.o. female here today for follow up. She was seen in consult with Dr Terrace Arabia 08/2023 for neck pain thought to be musculoskeletal in nature. Trigger point injection given and advised to follow up as needed. She saw Dr Vickey Huger 08/2023 for concerns of sleep apnea. HST 09/2023 showed very severe snoring, hypoxemia, and moderate severe obstructive sleep apnea. By AASM criteria the AHI is 28.9/h and by CMS criteria 17.2/h both qualify for moderate severe sleep apnea. There was no central apnea noted. AutoPAP advised. Since, she reports having difficulty adjusting to therapy. She did well for a few weeks after first starting therapy but tells me that she was unable to continue therapy as she was fighting with the tubing. She requested an adapter to help with movement of tubing. Adapter was not delivered in timely manner. She did receive it but now is waiting for a new mask. She is willing to continue therapy. She continues to have waxing and waning neck pain. She is more concerned with new onset swelling of ankles. On amlodipine.     History (copied from Dr Dohmeier's previous note)  [name] is a 73 y.o. female patient who is seen upon referral from Dr  [name] on 08/13/2023  for a sleep medicine consultation..  Chief concern according to patient :  " I have neck pain ( Dr Terrace Arabia )  and rheumatoid arthritis, weight issues. Has lost 20 pounds and is now at BMI 41. Patient has symptoms of sleep disordered breathing and high risk phenotype. Counseled on risks associated with undiagnosed / untreated sleep disorders.  Losing 15% of BW may improve symptoms.  She is a snorer, and her late husband commented on that. She lost him 9 years ago.  I have the pleasure of seeing [name] on 08/13/23 a right -handed AA female with a possible sleep disorder.     Sleep relevant medical history: Nocturia at 2 and 4 AM , no ENT surgeries.    Family medical /sleep history: no other family member on CPAP with OSA, nobody with insomnia, no sleep walkers.    Social history: grew up in a [place]  2 siblings, still working as a Clinical research associate, widowed since 2015.  Patient is working as an Pensions consultant  and lives in a household alone.  No pets. Childless.  The patient currently works part time, reduced client list  Tobacco use; none .  ETOH use ; yes, socially -2/ week.  Caffeine intake in form of Coffee( 24 ounces) Soda( /) Tea ( yes, home made) , no energy drinks. Regular Exercise; walking with walker currently.    Sleep habits are as follows: The patient's dinner time is variable , sometimes skipped. The patient goes to bed at 11 PM -  1 AM, and asleep promptly- she works frequently at 1 AM- 3 AM, continues to sleep for 6 hours, fragmented , wakes for 2 bathroom breaks, the first time at 2 AM.   The preferred sleep position is lateral , with the support of 2 pillows. Neck support. Dreams are reportedly rare.  The patient wakes up spontaneously at 6.30-7 without an alarm. She stays in bed after waking up, rises at 8.30 AM . She reports not feeling refreshed or restored in AM, with symptoms such as  residual fatigue.  Naps are taken infrequently, lasting from 1-3 hours , after lunch-   are more refreshing than nocturnal sleep.    Observations/Objective:  Generalized: Well developed, in no acute distress  Mentation: Alert oriented to time, place, history taking. Follows all commands speech and language fluent   Assessment and Plan:  73 y.o. year old female  has a past medical history of Acute bronchitis, Allergy, Anemia, Anxiety, Arthritis, Asthma, Back pain, Backache, unspecified, Cataract, Depression, Diverticulitis, Diverticulosis of colon (without mention of hemorrhage), Edema, GERD (gastroesophageal reflux disease), Glaucoma, Joint pain, Lactose intolerance, Lumbago, Neuropathy, Obesity, Osteoarthritis, Other chest pain, Pain in joint, ankle and foot, Rheumatoid arthritis (HCC), Sarcoidosis, SOB (shortness of breath), Tibialis tendinitis, and Unspecified asthma(493.90). here with    ICD-10-CM   1. OSA on CPAP  G47.33       [name] reports having difficulty getting supplies needed to continue therapy from DME. She is currently waiting for replacement mask. I have encouraged her to contact DME if she has not received equipment today. She will monitor for leak at home. Current 90 day report shows sub optimal compliance. She was encouraged to use CPAP nightly for at least 4 hours. She will continue close follow up with PCP and rheumatology for neck pain. She will follow up with me in 4 months for compliance review. She verbalizes understanding and agreement with this plan.   No orders of the defined types were placed in this encounter.   No orders of the defined types were placed in this encounter.    Follow Up Instructions:  I discussed the assessment and treatment plan with the patient. The patient was provided an opportunity to ask questions and all were answered. The patient agreed with the plan and demonstrated an understanding of the instructions.   The patient was advised to call back or seek an in-person evaluation if the symptoms worsen or if the condition  fails to improve as anticipated.  I provided 30 minutes of face-to-face and non face-to-face time during this MyChart video encounter. Patient located at their place of residence. Provider is in the office.    Shawnie Dapper, NP

## 2024-01-07 ENCOUNTER — Encounter: Payer: Self-pay | Admitting: Family Medicine

## 2024-01-07 ENCOUNTER — Telehealth: Admitting: Family Medicine

## 2024-01-07 DIAGNOSIS — G4733 Obstructive sleep apnea (adult) (pediatric): Secondary | ICD-10-CM | POA: Diagnosis not present

## 2024-01-09 DIAGNOSIS — G4733 Obstructive sleep apnea (adult) (pediatric): Secondary | ICD-10-CM | POA: Diagnosis not present

## 2024-01-17 DIAGNOSIS — G4733 Obstructive sleep apnea (adult) (pediatric): Secondary | ICD-10-CM | POA: Diagnosis not present

## 2024-01-22 ENCOUNTER — Telehealth (INDEPENDENT_AMBULATORY_CARE_PROVIDER_SITE_OTHER): Admitting: Internal Medicine

## 2024-01-22 ENCOUNTER — Encounter (INDEPENDENT_AMBULATORY_CARE_PROVIDER_SITE_OTHER): Payer: Self-pay | Admitting: Internal Medicine

## 2024-01-22 VITALS — Ht <= 58 in

## 2024-01-22 DIAGNOSIS — T43205D Adverse effect of unspecified antidepressants, subsequent encounter: Secondary | ICD-10-CM

## 2024-01-22 DIAGNOSIS — T380X5D Adverse effect of glucocorticoids and synthetic analogues, subsequent encounter: Secondary | ICD-10-CM

## 2024-01-22 DIAGNOSIS — T425X5D Adverse effect of mixed antiepileptics, subsequent encounter: Secondary | ICD-10-CM

## 2024-01-22 DIAGNOSIS — R7303 Prediabetes: Secondary | ICD-10-CM | POA: Diagnosis not present

## 2024-01-22 DIAGNOSIS — E66813 Obesity, class 3: Secondary | ICD-10-CM

## 2024-01-22 DIAGNOSIS — R635 Abnormal weight gain: Secondary | ICD-10-CM

## 2024-01-22 DIAGNOSIS — Z6841 Body Mass Index (BMI) 40.0 and over, adult: Secondary | ICD-10-CM

## 2024-01-22 DIAGNOSIS — R948 Abnormal results of function studies of other organs and systems: Secondary | ICD-10-CM | POA: Diagnosis not present

## 2024-01-22 MED ORDER — METFORMIN HCL ER 500 MG PO TB24: 500.0000 mg | ORAL_TABLET | Freq: Every day | ORAL | 1 refills | Status: DC

## 2024-01-22 NOTE — Progress Notes (Signed)
 Virtual Visit via Video Note  I connected withNAME@ on 01/22/24 at  3:00 PM EDT by a video enabled telemedicine application and verified that I am speaking with the correct person using two identifiers.  Location:  Patient: home Provider: Office   I discussed the limitations of evaluation and management by telemedicine and the availability of in person appointments. The patient expressed understanding and agreed to proceed.    Weight Summary And Biometrics  No data recorded Anthropometric Measurements Height: 4\' 10"  (1.473 m) Weight at Last Visit: 199 lb Starting Weight: 202 lb Total Weight Loss (lbs): 3 lb (1.361 kg) Peak Weight: 223 lb   No data recorded  No data recorded Today's Visit #: 6  Starting Date: 05/22/23   Subjective   Chief Complaint: Obesity  [name] is here to discuss her progress with her obesity treatment plan. She is on the the Category 1 Plan and states she is following her eating plan approximately 50-60 % of the time. She states she is exercising 20 minutes 3 times per week.  Weight Progress Since Last Visit:  Since last office visit she has maintained weight. Does not have a scale to weigh herself, but feels that she has maintained.     Patient Reported Barriers to Progress: difficulty implementing reduced calorie nutrition plan, difficulty maintaining a reduced calorie state, low volume of physical activity at present , orthopedic problems, medical conditions or chronic pain affecting mobility, medical comorbidities, presence of obesogenic drugs, slow metabolism for age, and menopause.   Orexigenic Control: Denies problems with appetite and hunger signals.  Denies problems with satiety and satiation.  Denies problems with eating patterns and portion control.  Denies abnormal cravings. Denies feeling deprived or restricted.   Pharmacotherapy for weight management: She is currently taking no anti-obesity medication and patient has declined  pharmacotherapy in past.   Assessment and Plan   Treatment Plan For Obesity:  Recommended Dietary Goals  [name] is currently in the action stage of change. As such, her goal is to continue weight management plan. She has agreed to: follow the Category 1 plan - 1000 kcal per day  Behavioral Health and Counseling  We discussed the following behavioral modification strategies today: increasing lean protein intake to established goals, decreasing simple carbohydrates , increasing vegetables, avoiding skipping meals, increasing water  intake , work on meal planning and preparation, and continue to work on maintaining a reduced calorie state, getting the recommended amount of protein, incorporating whole foods, making healthy choices, staying well hydrated and practicing mindfulness when eating..  Additional education and resources provided today: none   Recommended Physical Activity Goals  [name] has been advised to work up to 150 minutes of moderate intensity aerobic activity a week and strengthening exercises 2-3 times per week for cardiovascular health, weight loss maintenance and preservation of muscle mass.   She has agreed to :  Think about enjoyable ways to increase daily physical activity and overcoming barriers to exercise and Increase physical activity in their day and reduce sedentary time (increase NEAT).  Pharmacotherapy  We discussed various medication options to help Zephaniah with her weight loss efforts and we both agreed to :  Start metformin  XR 500 mg once a day  Associated Conditions Impacted by Obesity Treatment  Prediabetes Assessment & Plan: Most recent A1c is  Lab Results  Component Value Date   HGBA1C 5.9 (H) 05/22/2023   HGBA1C 5.6 09/01/2020    Patient aware of disease state and risk of progression. This may  contribute to abnormal cravings, fatigue and diabetic complications without having diabetes.   She is also on several medications that promote  weight.  We educated her on the carb insulin  model and she is aware about the importance of maintaining a diet with a low glycemic load.  Patient has had progressive weight gain, she is also on obesogenic medications which increase her risk of diabetes progression.  She will benefit from pharmacoprophylaxis .  She had been on semaglutide  with good clinical response but medication has been declined by her insurance.  Continue reducing simple and added sugars in her diet.  She will be started on metformin  XR 500 mg 1 tablet daily.    Weight gain due to medication Assessment & Plan: Patient on antiepileptics, antidepressants, steroids and immunomodulators.    Unfortunately in the presence of these needed medications she will continue to gain weight and will eventually develop diabetes without pharmacoprophylaxis.  We decided to try metformin  XR 500 mg 1 tablet daily   Class 3 severe obesity with serious comorbidity and body mass index (BMI) of 40.0 to 44.9 in adult, unspecified obesity type [facility]) Assessment & Plan: Patient has a complex obesity and some obstacles including a slow metabolic rate, weight promoting medications, menopausal state, and low volume of physical activity due to orthopedic problems.  She will continue working on portion control and mindfulness eating and incorporating more whole foods.  We discussed the benefits and side effects associated with metformin  and she agrees to starting metformin  XR 500 mg 1 tablet daily for diabetes prevention and also weight management.   Abnormal metabolism Assessment & Plan: Patient has a slower than predicted metabolism. IC 1267 vs. calculated 1390.  Likely secondary to medications, chronic disease and low volume of physical activity.  This may contribute to weight gain, chronic fatigue and difficulty losing weight.   We reviewed measures to improve metabolism including not skipping meals, progressive strengthening exercises, increasing  protein intake at every meal and maintaining adequate hydration and sleep.        Objective   Physical Exam:  Height 4\' 10"  (1.473 m). Body mass index is 41.59 kg/m.  General: She is overweight, cooperative, alert, well developed, and in no acute distress. PSYCH: Has normal mood, affect and thought process.   Lungs: Normal breathing effort, no conversational dyspnea.    Diagnostic Data Reviewed:  BMET    Component Value Date/Time   NA 138 05/03/2023 1235   K 4.0 05/03/2023 1235   CL 102 05/03/2023 1235   CO2 29 05/03/2023 1235   GLUCOSE 148 (H) 05/03/2023 1235   BUN 7 (L) 05/03/2023 1235   CREATININE 0.58 05/03/2023 1235   CREATININE 0.58 (L) 09/01/2020 1139   CALCIUM 8.8 (L) 05/03/2023 1235   GFRNONAA >60 05/03/2023 1235   GFRNONAA 93 09/01/2020 1139   GFRAA 107 09/01/2020 1139   Lab Results  Component Value Date   HGBA1C 5.9 (H) 05/22/2023   HGBA1C 5.6 09/01/2020   Lab Results  Component Value Date   INSULIN  5.0 05/22/2023   Lab Results  Component Value Date   TSH 0.918 11/09/2022   CBC    Component Value Date/Time   WBC 3.5 (L) 05/03/2023 1235   RBC 3.76 (L) 05/03/2023 1235   HGB 11.8 (L) 05/03/2023 1235   HCT 38.2 05/03/2023 1235   PLT 226 05/03/2023 1235   MCV 101.6 (H) 05/03/2023 1235   MCH 31.4 05/03/2023 1235   MCHC 30.9 05/03/2023 1235   RDW 15.1 05/03/2023  1235   Iron Studies    Component Value Date/Time   IRON 58 05/22/2023 0859   TIBC 370 05/22/2023 0859   FERRITIN 57 05/22/2023 0859   IRONPCTSAT 16 05/22/2023 0859   Lipid Panel     Component Value Date/Time   CHOL 216 (H) 05/22/2023 0859   TRIG 83 05/22/2023 0859   HDL 77 05/22/2023 0859   CHOLHDL 3 06/26/2022 1124   VLDL 23.2 06/26/2022 1124   LDLCALC 124 (H) 05/22/2023 0859   LDLCALC 110 (H) 09/01/2020 1139   Hepatic Function Panel     Component Value Date/Time   PROT 6.4 (L) 05/03/2023 1235   ALBUMIN  3.9 05/03/2023 1235   AST 15 05/03/2023 1235   ALT 11 05/03/2023  1235   ALKPHOS 53 05/03/2023 1235   BILITOT 0.6 05/03/2023 1235   BILIDIR 0.0 09/05/2016 0912      Component Value Date/Time   TSH 0.918 11/09/2022 0023   TSH 1.03 06/26/2022 1124   Nutritional Lab Results  Component Value Date   VD25OH 35.3 05/22/2023   VD25OH 54.11 08/25/2021   VD25OH 14.22 (L) 05/26/2021    Follow-Up   Return in about 6 weeks (around 03/04/2024) for For Weight Mangement with Dr. Allie Area.Aaron Aas She was informed of the importance of frequent follow up visits to maximize her success with intensive lifestyle modifications for her multiple health conditions.  Attestation Statement   Reviewed by clinician on day of visit: allergies, medications, problem list, medical history, surgical history, family history, social history, and previous encounter notes.   I discussed the assessment and treatment plan with the patient. The patient was provided an opportunity to ask questions and all were answered. The patient agreed with the plan and demonstrated an understanding of the instructions.   The patient was advised to call back or seek an in-person evaluation if the symptoms worsen or if the condition fails to improve as anticipated.   I have spent 23 minutes in the care of the patient today including: 1 minutes before the visit reviewing and prepping the chart 15 minutes face-to-face assessing and reviewing listed medical problems as outlined in obesity care plan, providing nutritional and behavioral counseling as outlined in obesity care plan, counseling regarding anti-obesity medication as outlined in obesity care plan, discussing biometric information and progress, reviewing pertinent diagnostics which are listed under medical problems, and ordering medications - see orders 7 minutes after the visit updating chart and documentation    Ladd Picker, MD

## 2024-01-22 NOTE — Assessment & Plan Note (Signed)
 Patient has a complex obesity and some obstacles including a slow metabolic rate, weight promoting medications, menopausal state, and low volume of physical activity due to orthopedic problems.  She will continue working on portion control and mindfulness eating and incorporating more whole foods.  We discussed the benefits and side effects associated with metformin  and she agrees to starting metformin  XR 500 mg 1 tablet daily for diabetes prevention and also weight management.

## 2024-01-22 NOTE — Assessment & Plan Note (Signed)
 Patient on antiepileptics, antidepressants, steroids and immunomodulators.    Unfortunately in the presence of these needed medications she will continue to gain weight and will eventually develop diabetes without pharmacoprophylaxis.  We decided to try metformin  XR 500 mg 1 tablet daily

## 2024-01-22 NOTE — Assessment & Plan Note (Signed)
 Most recent A1c is  Lab Results  Component Value Date   HGBA1C 5.9 (H) 05/22/2023   HGBA1C 5.6 09/01/2020    Patient aware of disease state and risk of progression. This may contribute to abnormal cravings, fatigue and diabetic complications without having diabetes.   She is also on several medications that promote weight.  We educated her on the carb insulin  model and she is aware about the importance of maintaining a diet with a low glycemic load.  Patient has had progressive weight gain, she is also on obesogenic medications which increase her risk of diabetes progression.  She will benefit from pharmacoprophylaxis .  She had been on semaglutide  with good clinical response but medication has been declined by her insurance.  Continue reducing simple and added sugars in her diet.  She will be started on metformin  XR 500 mg 1 tablet daily.

## 2024-01-22 NOTE — Assessment & Plan Note (Signed)
Patient has a slower than predicted metabolism. IC 1267 vs. calculated 1390.  Likely secondary to medications, chronic disease and low volume of physical activity.  This may contribute to weight gain, chronic fatigue and difficulty losing weight.  We reviewed measures to improve metabolism including not skipping meals, progressive strengthening exercises, increasing protein intake at every meal and maintaining adequate hydration and sleep.

## 2024-02-08 DIAGNOSIS — G4733 Obstructive sleep apnea (adult) (pediatric): Secondary | ICD-10-CM | POA: Diagnosis not present

## 2024-02-20 DIAGNOSIS — G4733 Obstructive sleep apnea (adult) (pediatric): Secondary | ICD-10-CM | POA: Diagnosis not present

## 2024-02-27 ENCOUNTER — Other Ambulatory Visit: Payer: Self-pay | Admitting: Family Medicine

## 2024-02-27 ENCOUNTER — Other Ambulatory Visit (INDEPENDENT_AMBULATORY_CARE_PROVIDER_SITE_OTHER): Payer: Self-pay | Admitting: Internal Medicine

## 2024-02-27 DIAGNOSIS — R635 Abnormal weight gain: Secondary | ICD-10-CM

## 2024-02-27 DIAGNOSIS — R7303 Prediabetes: Secondary | ICD-10-CM

## 2024-02-27 DIAGNOSIS — J3089 Other allergic rhinitis: Secondary | ICD-10-CM

## 2024-02-27 DIAGNOSIS — J4541 Moderate persistent asthma with (acute) exacerbation: Secondary | ICD-10-CM

## 2024-03-10 DIAGNOSIS — G4733 Obstructive sleep apnea (adult) (pediatric): Secondary | ICD-10-CM | POA: Diagnosis not present

## 2024-03-19 DIAGNOSIS — M19071 Primary osteoarthritis, right ankle and foot: Secondary | ICD-10-CM | POA: Diagnosis not present

## 2024-03-19 DIAGNOSIS — M19072 Primary osteoarthritis, left ankle and foot: Secondary | ICD-10-CM | POA: Diagnosis not present

## 2024-03-26 DIAGNOSIS — H903 Sensorineural hearing loss, bilateral: Secondary | ICD-10-CM | POA: Diagnosis not present

## 2024-04-02 ENCOUNTER — Telehealth: Payer: Self-pay | Admitting: Family Medicine

## 2024-04-02 NOTE — Telephone Encounter (Signed)
 Pt would like a call from RN to discuss if based upon the left nuchal area  pain has returned can she return for the injections given at that time.

## 2024-04-02 NOTE — Telephone Encounter (Signed)
 I think this was for Dr. Onita. Pt follows Dr. Scot for OSA/CPAP

## 2024-04-03 DIAGNOSIS — M0579 Rheumatoid arthritis with rheumatoid factor of multiple sites without organ or systems involvement: Secondary | ICD-10-CM | POA: Diagnosis not present

## 2024-04-03 DIAGNOSIS — Z862 Personal history of diseases of the blood and blood-forming organs and certain disorders involving the immune mechanism: Secondary | ICD-10-CM | POA: Diagnosis not present

## 2024-04-03 DIAGNOSIS — M2550 Pain in unspecified joint: Secondary | ICD-10-CM | POA: Diagnosis not present

## 2024-04-03 DIAGNOSIS — Z79899 Other long term (current) drug therapy: Secondary | ICD-10-CM | POA: Diagnosis not present

## 2024-04-03 DIAGNOSIS — M810 Age-related osteoporosis without current pathological fracture: Secondary | ICD-10-CM | POA: Diagnosis not present

## 2024-04-03 DIAGNOSIS — M1990 Unspecified osteoarthritis, unspecified site: Secondary | ICD-10-CM | POA: Diagnosis not present

## 2024-04-03 NOTE — Telephone Encounter (Signed)
 Call to patient, she reports pain in her neck has returned and would like Dr. Onita to do another trigger point injection. She repots that helped tremendously. She reports currently taking just tylenol  in the morning and evening, which doesn't provided much relief. She felt so much better after the trigger point. SH eis aware Dr. ONITA is going out of town and is willing to wait on her

## 2024-04-03 NOTE — Telephone Encounter (Signed)
 Ok to add her on today or July 7th

## 2024-04-03 NOTE — Telephone Encounter (Signed)
 Call to patient, agreeable to Monday 10:30 appt for trigger point injection, aware she is add on

## 2024-04-07 ENCOUNTER — Telehealth: Payer: Self-pay | Admitting: Neurology

## 2024-04-07 ENCOUNTER — Ambulatory Visit (INDEPENDENT_AMBULATORY_CARE_PROVIDER_SITE_OTHER): Admitting: Neurology

## 2024-04-07 ENCOUNTER — Encounter: Payer: Self-pay | Admitting: Neurology

## 2024-04-07 VITALS — BP 132/74 | Ht 60.0 in | Wt 202.0 lb

## 2024-04-07 DIAGNOSIS — R5382 Chronic fatigue, unspecified: Secondary | ICD-10-CM | POA: Diagnosis not present

## 2024-04-07 DIAGNOSIS — M542 Cervicalgia: Secondary | ICD-10-CM | POA: Diagnosis not present

## 2024-04-07 MED ORDER — CELECOXIB 50 MG PO CAPS: 50.0000 mg | ORAL_CAPSULE | Freq: Two times a day (BID) | ORAL | 0 refills | Status: AC | PRN

## 2024-04-07 NOTE — Telephone Encounter (Signed)
 [facility] is taking this patient.

## 2024-04-07 NOTE — Progress Notes (Signed)
 1.5 cc 0.5%bupivicaine mixed with 1.5 cc Betamethasone   (6mg /ml)  Bupivicaine 0.5% NDC 9856-0670-98 Lot [id number] Ex 12/2024  Betamethasone  6mg /ml NDC 0517-0720-01 Lot [id number] Ex 11/01/2024

## 2024-04-07 NOTE — Progress Notes (Signed)
   History: Persistent left more than right upper nuchal area pain, radiating pain to bilateral occipital region  Left occipital nerve block, and trigger point injection for left nuchal area  Bupivacaine  0.5% 1.5 cc was mixed with 1.5 cc of betamethasone  (6 mg/cc)  The injection was placed at left occipital nuchal area where she has most tenderness,  Injections were made with a 27-gauge needle.  She tolerated injection well, noticed immediate improvement afterwards

## 2024-04-07 NOTE — Progress Notes (Signed)
 Chief Complaint  Patient presents with   Neck Pain    Rm 14, here for trigger point injection, neck pain      ASSESSMENT AND PLAN  [name] is a 73 y.o. female   Cervical degenerative disease Left occipital area pain, radiating pain to left parietal region,  Significant tenderness bilateral nuchal region upon deep palpitation, left more than right, suggestive of musculoskeletal etiology, likely a component of left occipital neuralgia, performed trigger point injection today,    MRI of cervical spine from January 2024 multilevel degenerative changes, no cord compression, variable degree of foraminal narrowing, facet joint disease most noticeable at bilateral C6-7,  Celebrex  50 mg as needed  Warm compression, neck stretching exercise  Home physical therapy  DIAGNOSTIC DATA (LABS, IMAGING, TESTING) - I reviewed patient records, labs, notes, testing and imaging myself where available.   MEDICAL HISTORY:  [name] is a 73 year old female, seen in request by Ortho clinic [facility]ud[facility] for evaluation of left occipital area pain, her primary care physician is Dr., Mercer, Clotilda  History is obtained from the patient and review of electronic medical records. I personally reviewed pertinent available imaging films in PACS.   PMHx of  RA HTN Osteoporosis Sarcoidosis History of bilateral knee replacement  She lives alone, came in wheelchair, ambulate with walker at home, suffered rheumatoid arthritis, multiple joints pain, significant neck pain, underwent her first epidural injection on June 25, 2024 at orthopedic clinic, which did help her neck pain but she still have residual transient sharp radiating feeling from left occipital area to left parietal region, seems to be triggered by neck movement,  Above symptoms has present for few months, along with her limited range of neck, neck tightness, neck pain, reported significant improvement after epidural injection,  but still have left occipital area shooting pain  MRI of cervical spine was done at the Ortho clinic, I do not have the formal report, She has significant tenderness along left nuchal line on today's examination,  UPDATE April 07 2024: She had recurrent neck pain since June, more towards left side, difficulty turning her neck, for her not feel comfortable driving anymore, take transportation today, she had 1 set of trigger point injection to her left occipital region in October 2024, with significant improvement,  MRI cervical Jan 2024: multiple level degenerative changes, no cord compression, variable foraminal narrowing, facet joint inflammation, most noticeable at bilateral C6-7.  She has gait abnormality rely on walker, also limited due to her big body habitus, bilateral knee replacement, denies bowel and bladder incontinence  PHYSICAL EXAM:   Vitals:   04/07/24 1032  BP: 132/74  Weight: 202 lb (91.6 kg)  Height: 5' (1.524 m)   Body mass index is 39.45 kg/m.  PHYSICAL EXAMNIATION:  Gen: NAD, conversant, well nourised, well groomed                     Cardiovascular: Regular rate rhythm, no peripheral edema, warm, nontender. Eyes: Conjunctivae clear without exudates or hemorrhage Neck: Supple, no carotid bruits.  Significant tenderness of left more than right upper cervical region along left nuchal line upon deep palpitation Pulmonary: Clear to auscultation bilaterally   NEUROLOGICAL EXAM:  MENTAL STATUS: Speech/cognition: Awake, alert, oriented to history taking and casual conversation CRANIAL NERVES: CN II: Visual fields are full to confrontation. Pupils are round equal and briskly reactive to light. CN III, IV, VI: extraocular movement are normal. No ptosis. CN V: Facial sensation is intact  to light touch CN VII: Face is symmetric with normal eye closure  CN VIII: Hearing is normal to causal conversation. CN IX, X: Phonation is normal. CN XI: Head turning and shoulder  shrug are intact  MOTOR: There is no pronator drift of out-stretched arms. Muscle bulk and tone are normal. Muscle strength is normal.  REFLEXES: Reflexes are hypoactive and symmetric at the biceps, triceps, knees, and ankles. Plantar responses are flexor.  SENSORY: Intact to light touch, pinprick and vibratory sensation are intact in fingers and toes.  COORDINATION: There is no trunk or limb dysmetria noted.  GAIT/STANCE: rely on her walker, cautious  REVIEW OF SYSTEMS:  Full 14 system review of systems performed and notable only for as above All other review of systems were negative.   ALLERGIES: Allergies  Allergen Reactions   Invanz  [Ertapenem ] Swelling    Lips swelled up   Ibuprofen Rash   Sulfa Antibiotics Rash    HOME MEDICATIONS: Current Outpatient Medications  Medication Sig Dispense Refill   acetaminophen  (TYLENOL ) 500 MG tablet Take 1,000 mg by mouth in the morning and at bedtime.     albuterol  (VENTOLIN  HFA) 108 (90 Base) MCG/ACT inhaler TAKE 2 PUFFS BY MOUTH EVERY 6 HOURS AS NEEDED (Patient taking differently: Inhale 2 puffs into the lungs every 6 (six) hours as needed for wheezing or shortness of breath. TAKE 2 PUFFS BY MOUTH EVERY 6 HOURS AS NEEDED) 18 g 3   alendronate  (FOSAMAX ) 70 MG tablet Take 70 mg by mouth every Monday.     amLODipine  (NORVASC ) 5 MG tablet Take 1 tablet (5 mg total) by mouth daily. 90 tablet 3   amoxicillin  (AMOXIL ) 500 MG capsule Take 2,000 mg by mouth See admin instructions. Take 2000mg  by mouth 1 hour before appt.     aspirin  EC 325 MG tablet Take 325 mg by mouth daily.     Blood Pressure Monitor MISC 1 Device by Does not apply route daily. 1 each 0   Cholecalciferol (VITAMIN D3) 50 MCG (2000 UT) capsule      cyanocobalamin  (VITAMIN B12) 1000 MCG/ML injection Inject 1,000 mcg into the muscle every 30 (thirty) days.     cycloSPORINE  (RESTASIS ) 0.05 % ophthalmic emulsion Place 1 drop into both eyes 2 (two) times daily.     DULoxetine   (CYMBALTA ) 30 MG capsule Take 1 capsule (30 mg total) by mouth daily. 30 capsule 1   fexofenadine  (ALLEGRA ) 180 MG tablet Take 1 tablet (180 mg total) by mouth daily. 90 tablet 3   fluticasone  (FLONASE ) 50 MCG/ACT nasal spray Place 1 spray into both nostrils daily. 16 g 3   fluticasone -salmeterol (ADVAIR) 250-50 MCG/ACT AEPB INHALE ONE PUFF INTO THE LUNGS TWICE DAILY 120 each 3   folic acid  (FOLVITE ) 1 MG tablet TAKE ONE TABLET BY MOUTH EVERY DAY 90 tablet 3   gabapentin  (NEURONTIN ) 300 MG capsule Take 1 capsule (300 mg total) by mouth 2 (two) times daily. 180 capsule 3   IYUZEH 0.005 % SOLN SMARTSIG:1 Drop(s) In Eye(s) Every Evening     KEVZARA 200 MG/1. SOAJ Inject into the skin.     Lactase (LACTAID FAST ACT) 9000 units CHEW Chew by mouth. Dosage unknown     leflunomide (ARAVA) 20 MG tablet Take 20 mg by mouth daily.     LUMIGAN 0.01 % SOLN SMARTSIG:1 Drop(s) In Eye(s) Every Evening     meloxicam  (MOBIC ) 15 MG tablet Take 1 tablet (15 mg total) by mouth daily. 30 tablet 0   metFORMIN  (  GLUCOPHAGE -XR) 500 MG 24 hr tablet Take 1 tablet (500 mg total) by mouth daily with breakfast. 30 tablet 1   Multiple Vitamins-Minerals (MULTIVITAMIN) tablet Take 1 tablet by mouth daily.     pantoprazole  (PROTONIX ) 40 MG tablet Take 1 tablet (40 mg total) by mouth daily. 30 tablet 0   polyethylene glycol powder (GLYCOLAX /MIRALAX ) 17 GM/SCOOP powder Take 17 g by mouth daily as needed. 225 g 0   predniSONE  (DELTASONE ) 5 MG tablet Take 1 tablet (5 mg total) by mouth daily. 90 tablet 1   senna-docusate (SENOKOT-S) 8.6-50 MG tablet Take 1 tablet by mouth daily. 30 tablet 2   RHOPRESSA 0.02 % SOLN Ophthalmic for 56 Days     RHOPRESSA 0.02 % SOLN Apply 1 drop to eye at bedtime. (Patient not taking: Reported on 04/07/2024)     Current Facility-Administered Medications  Medication Dose Route Frequency Provider Last Rate Last Admin   cyanocobalamin  (VITAMIN B12) injection 1,000 mcg  1,000 mcg Intramuscular Once          PAST MEDICAL HISTORY: Past Medical History:  Diagnosis Date   Acute bronchitis    Allergy    Anemia    Anxiety    Arthritis    knees   Asthma    Back pain    Backache, unspecified    Cataract    removed both eyes   Depression    Diverticulitis    Diverticulosis of colon (without mention of hemorrhage)    Edema    GERD (gastroesophageal reflux disease)    Glaucoma    Joint pain    Lactose intolerance    Lumbago    Neuropathy    Obesity    Osteoarthritis    Other chest pain    Pain in joint, ankle and foot    Rheumatoid arthritis (HCC)    Sarcoidosis    SOB (shortness of breath)    Tibialis tendinitis    Unspecified asthma(493.90)     PAST SURGICAL HISTORY: Past Surgical History:  Procedure Laterality Date   ABDOMINAL HYSTERECTOMY     CATARACT EXTRACTION W/PHACO Right 06/24/2014   Procedure: CATARACT EXTRACTION PHACO AND INTRAOCULAR LENS PLACEMENT (IOC) RIGHT EYE WITH GONIOSYNECHIALYSIS;  Surgeon: Gaither Quan, MD;  Location: [facility] OR;  Service: Ophthalmology;  Laterality: Right;   COLONOSCOPY     EYE SURGERY Left    cataract surgery   HAND SURGERY Right    trigger finger release   IR RADIOLOGIST EVAL & MGMT  11/14/2018   KNEE ARTHROSCOPY Right    TOTAL KNEE ARTHROPLASTY Right 12/08/2021   Procedure: RIGHT TOTAL KNEE ARTHROPLASTY APPLICATION OF WOUND VAC;  Surgeon: Addie Cordella Hamilton, MD;  Location: [facility] OR;  Service: Orthopedics;  Laterality: Right;   TOTAL KNEE ARTHROPLASTY Left 04/13/2022   Procedure: LEFT TOTAL KNEE ARTHROPLASTY;  Surgeon: Addie Cordella Hamilton, MD;  Location: [facility] OR;  Service: Orthopedics;  Laterality: Left;    FAMILY HISTORY: Family History  Problem Relation Age of Onset   Hypertension Mother    Stroke Mother    Depression Mother    Anxiety disorder Mother    Obesity Mother    Diabetes Father    Sarcoidosis Sister    Colon cancer Neg Hx    Colon polyps Neg Hx    Esophageal cancer Neg Hx    Rectal cancer Neg Hx    Stomach cancer Neg  Hx     SOCIAL HISTORY: Social History   Socioeconomic History   Marital status: Widowed  Spouse name: Not on file   Number of children: 0   Years of education: Not on file   Highest education level: Professional school degree (e.g., MD, DDS, DVM, JD)  Occupational History   Occupation: Attorney (semi-retired)  Tobacco Use   Smoking status: Never   Smokeless tobacco: Never  Vaping Use   Vaping status: Never Used  Substance and Sexual Activity   Alcohol use: Yes    Comment: 1 glass of wine or mixed drink once a week   Drug use: No   Sexual activity: Not on file  Other Topics Concern   Not on file  Social History Narrative   Not on file   Social Drivers of Health   Financial Resource Strain: Low Risk  (12/19/2023)   Overall Financial Resource Strain (CARDIA)    Difficulty of Paying Living Expenses: Not very hard  Food Insecurity: Food Insecurity Present (12/19/2023)   Hunger Vital Sign    Worried About Running Out of Food in the Last Year: Sometimes true    Ran Out of Food in the Last Year: Never true  Transportation Needs: Unmet Transportation Needs (12/19/2023)   PRAPARE - Transportation    Lack of Transportation (Medical): Yes    Lack of Transportation (Non-Medical): Yes  Physical Activity: Insufficiently Active (12/19/2023)   Exercise Vital Sign    Days of Exercise per Week: 2 days    Minutes of Exercise per Session: 50 min  Stress: Stress Concern Present (12/19/2023)   [employer] of Occupational Health - Occupational Stress Questionnaire    Feeling of Stress : To some extent  Social Connections: Moderately Integrated (12/19/2023)   Social Connection and Isolation Panel    Frequency of Communication with Friends and Family: More than three times a week    Frequency of Social Gatherings with Friends and Family: Twice a week    Attends Religious Services: More than 4 times per year    Active Member of [employer] or Organizations: Yes    Attends Banker  Meetings: More than 4 times per year    Marital Status: Widowed  Intimate Partner Violence: Not At Risk (07/25/2023)   Humiliation, Afraid, Rape, and Kick questionnaire    Fear of Current or Ex-Partner: No    Emotionally Abused: No    Physically Abused: No    Sexually Abused: No      Modena Callander, M.D. Ph.D.  [facility] [address] [place], [place] [address] Ph: [phone] Fax: [phone]  CC:  Mercer Clotilda SAUNDERS, MD [address] [place],  [place] [address]  Mercer Clotilda SAUNDERS, MD

## 2024-04-08 ENCOUNTER — Other Ambulatory Visit: Payer: Self-pay | Admitting: Family Medicine

## 2024-04-08 DIAGNOSIS — M792 Neuralgia and neuritis, unspecified: Secondary | ICD-10-CM

## 2024-04-09 DIAGNOSIS — G4733 Obstructive sleep apnea (adult) (pediatric): Secondary | ICD-10-CM | POA: Diagnosis not present

## 2024-04-10 DIAGNOSIS — F419 Anxiety disorder, unspecified: Secondary | ICD-10-CM | POA: Diagnosis not present

## 2024-04-10 DIAGNOSIS — I10 Essential (primary) hypertension: Secondary | ICD-10-CM | POA: Diagnosis not present

## 2024-04-10 DIAGNOSIS — F32A Depression, unspecified: Secondary | ICD-10-CM | POA: Diagnosis not present

## 2024-04-10 DIAGNOSIS — M50323 Other cervical disc degeneration at C6-C7 level: Secondary | ICD-10-CM | POA: Diagnosis not present

## 2024-04-10 DIAGNOSIS — M069 Rheumatoid arthritis, unspecified: Secondary | ICD-10-CM | POA: Diagnosis not present

## 2024-04-10 DIAGNOSIS — M1990 Unspecified osteoarthritis, unspecified site: Secondary | ICD-10-CM | POA: Diagnosis not present

## 2024-04-10 DIAGNOSIS — M810 Age-related osteoporosis without current pathological fracture: Secondary | ICD-10-CM | POA: Diagnosis not present

## 2024-04-10 DIAGNOSIS — G629 Polyneuropathy, unspecified: Secondary | ICD-10-CM | POA: Diagnosis not present

## 2024-04-10 DIAGNOSIS — K5730 Diverticulosis of large intestine without perforation or abscess without bleeding: Secondary | ICD-10-CM | POA: Diagnosis not present

## 2024-04-11 NOTE — Telephone Encounter (Signed)
 Nathanel from [facility] call to request verbal Authorization for Pt Home Health Physical Therapy  Frequency will be 1wk /1 and 2wk/3  Nathanel callback number is  [phone] , You can LVM

## 2024-04-11 NOTE — Telephone Encounter (Signed)
 Called [phone]  and left secure voicemail stating that we wefre agreeable to verbal orders under dr. Onita

## 2024-04-14 ENCOUNTER — Ambulatory Visit: Admitting: Family Medicine

## 2024-04-14 ENCOUNTER — Encounter: Payer: Self-pay | Admitting: Family Medicine

## 2024-04-14 VITALS — BP 132/98 | HR 94 | Temp 98.5°F | Ht 60.0 in | Wt 204.8 lb

## 2024-04-14 DIAGNOSIS — E66813 Obesity, class 3: Secondary | ICD-10-CM

## 2024-04-14 DIAGNOSIS — Z6841 Body Mass Index (BMI) 40.0 and over, adult: Secondary | ICD-10-CM | POA: Diagnosis not present

## 2024-04-14 DIAGNOSIS — R600 Localized edema: Secondary | ICD-10-CM

## 2024-04-14 DIAGNOSIS — I10 Essential (primary) hypertension: Secondary | ICD-10-CM

## 2024-04-14 DIAGNOSIS — H6992 Unspecified Eustachian tube disorder, left ear: Secondary | ICD-10-CM

## 2024-04-14 NOTE — Progress Notes (Signed)
 Established Patient Office Visit   Subjective  Patient ID: [name], female    DOB: 04-03-1951  Age: 73 y.o. MRN: [id number]  Chief Complaint  Patient presents with   Medical Management of Chronic Issues    Left ear pain, and left ankle swelling started 2 1/2 months ago     Pt is a 73 year old female seen for ongoing concern.  Patient endorses left ear pain and possible change in hearing times a while.  Seen by audiologist however advised to follow-up with PCP given pain.  Hearing is okay in right ear.  Patient states pain in left ear goes down into the neck.  Denies popping.  Patient endorses history of allergies.  States this year has been particularly bad.  Not currently taking any allergy medication.  Patient notes edema in left ankle x 2.5 months.  Seen by rheumatology.  Notes occasional aching in ankles.  Had injections in bilateral ankles.  Endorses some peripheral neuropathy in feet.  Pt dates she was advised on the need to discontinue prednisone  use so she stopped cold [place] a few weeks ago.  Patient has been on prednisone  5 mg daily for a a while.  Denies any increased symptoms since stopping medication.  Patient mentions having difficulty with getting consistent B12 injections due to inability to self administer.  Patient also notes having to arrange transportation to and from clinic is $60 per trip.  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    Patient Active Problem List   Diagnosis Date Noted   Chronic intermittent hypoxia with obstructive sleep apnea 09/21/2023   Morbid obesity (HCC) 08/13/2023   Hypersomnia 08/13/2023   Hx of sarcoidosis 08/13/2023   Cervicalgia of occipito-atlanto-axial region 08/13/2023   Occipital neuralgia of left side 07/14/2023   Neck pain 07/14/2023   Prediabetes 07/04/2023   Diarrhea 07/04/2023   Abnormal metabolism 05/22/2023   Weight gain due to  medication 05/22/2023   Low serum vitamin B12 05/22/2023   Class 3 severe obesity with serious comorbidity and body mass index (BMI) of 40.0 to 44.9 in adult 05/22/2023   Anemia 05/22/2023   Depression screen 05/22/2023   Chronic fatigue 05/22/2023   Suspected sleep apnea 05/22/2023   Rheumatoid arthritis (HCC) 11/29/2022   Infection due to ESBL-producing Escherichia coli 11/09/2022   Acute pyelonephritis 11/09/2022   Bacteremia 11/09/2022   Sepsis (HCC) 11/09/2022   SOB (shortness of breath) on exertion 11/08/2022   DJD (degenerative joint disease) of knee 04/13/2022   S/P TKR (total knee replacement), left 04/13/2022   Arthritis of right knee    S/P total knee arthroplasty, right 12/08/2021   Pressure injury of skin 08/27/2021   Fall 08/23/2021   Difficulty in walking 08/23/2021   Bilateral lower extremity edema 08/23/2021   Hormone replacement therapy 08/23/2021   Osteoporosis 01/28/2021   Osteoarthritis of right wrist 01/28/2021   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   Foot pain 09/13/2020   Knee pain 08/24/2018   Fall in home 07/14/2015   TIBIALIS TENDINITIS 08/25/2009   CHEST PAIN, OTHER, PAIN 10/21/2008   PEDAL EDEMA 07/22/2008   ANKLE PAIN, BILATERAL 04/01/2008   LOW BACK PAIN 04/01/2008   ACUTE BRONCHITIS 09/23/2007   SARCOIDOSIS 09/02/2007   Asthma 09/02/2007   Diverticulosis of colon 07/11/2007   Backache 07/11/2007   Past Medical History:  Diagnosis Date   Acute bronchitis    Allergy    Anemia  Anxiety    Arthritis    knees   Asthma    Back pain    Backache, unspecified    Cataract    removed both eyes   Depression    Diverticulitis    Diverticulosis of colon (without mention of hemorrhage)    Edema    GERD (gastroesophageal reflux disease)    Glaucoma    Joint pain    Lactose intolerance    Lumbago    Neuropathy    Obesity    Osteoarthritis    Other chest pain    Pain in joint, ankle and foot    Rheumatoid arthritis (HCC)    Sarcoidosis     SOB (shortness of breath)    Tibialis tendinitis    Unspecified asthma(493.90)    Past Surgical History:  Procedure Laterality Date   ABDOMINAL HYSTERECTOMY     CATARACT EXTRACTION W/PHACO Right 06/24/2014   Procedure: CATARACT EXTRACTION PHACO AND INTRAOCULAR LENS PLACEMENT (IOC) RIGHT EYE WITH GONIOSYNECHIALYSIS;  Surgeon: Gaither Quan, MD;  Location: [facility] OR;  Service: Ophthalmology;  Laterality: Right;   COLONOSCOPY     EYE SURGERY Left    cataract surgery   HAND SURGERY Right    trigger finger release   IR RADIOLOGIST EVAL & MGMT  11/14/2018   KNEE ARTHROSCOPY Right    TOTAL KNEE ARTHROPLASTY Right 12/08/2021   Procedure: RIGHT TOTAL KNEE ARTHROPLASTY APPLICATION OF WOUND VAC;  Surgeon: Addie Cordella Hamilton, MD;  Location: [facility] OR;  Service: Orthopedics;  Laterality: Right;   TOTAL KNEE ARTHROPLASTY Left 04/13/2022   Procedure: LEFT TOTAL KNEE ARTHROPLASTY;  Surgeon: Addie Cordella Hamilton, MD;  Location: [facility] OR;  Service: Orthopedics;  Laterality: Left;   Social History   Tobacco Use   Smoking status: Never   Smokeless tobacco: Never  Vaping Use   Vaping status: Never Used  Substance Use Topics   Alcohol use: Yes    Comment: 1 glass of wine or mixed drink once a week   Drug use: No   Family History  Problem Relation Age of Onset   Hypertension Mother    Stroke Mother    Depression Mother    Anxiety disorder Mother    Obesity Mother    Diabetes Father    Sarcoidosis Sister    Colon cancer Neg Hx    Colon polyps Neg Hx    Esophageal cancer Neg Hx    Rectal cancer Neg Hx    Stomach cancer Neg Hx    Allergies  Allergen Reactions   Invanz  [Ertapenem ] Swelling    Lips swelled up   Ibuprofen Rash   Sulfa Antibiotics Rash    ROS Negative unless stated above    Objective:     BP (!) 132/98 (BP Location: Right Wrist, Patient Position: Sitting, Cuff Size: Normal)   Pulse 94   Temp 98.5 F (36.9 C) (Oral)   Ht 5' (1.524 m)   Wt 204 lb 12.8 oz (92.9 kg)   SpO2  92%   BMI 40.00 kg/m  BP Readings from Last 3 Encounters:  04/14/24 (!) 132/98  04/07/24 132/74  12/11/23 137/79   Wt Readings from Last 3 Encounters:  04/14/24 204 lb 12.8 oz (92.9 kg)  04/07/24 202 lb (91.6 kg)  12/11/23 199 lb (90.3 kg)      Physical Exam Constitutional:      General: She is not in acute distress.    Appearance: Normal appearance.  HENT:     Head: Normocephalic and atraumatic.  Ears:     Comments: Bilateral TMs full L>R.  No erythema or effusion.    Nose: Nose normal.     Mouth/Throat:     Mouth: Mucous membranes are moist.  Cardiovascular:     Rate and Rhythm: Normal rate and regular rhythm.     Heart sounds: Normal heart sounds. No murmur heard.    No gallop.     Comments: LLE> RLE.  TTP of left lower calf. Pulmonary:     Effort: Pulmonary effort is normal. No respiratory distress.     Breath sounds: Normal breath sounds. No wheezing, rhonchi or rales.  Musculoskeletal:     Right lower leg: No edema.     Left lower leg: Edema present.  Skin:    General: Skin is warm and dry.  Neurological:     Mental Status: She is alert and oriented to person, place, and time.     Cranial Nerves: Cranial nerves 2-12 are intact.     Motor: Motor function is intact.     Comments: Patient ambulating with assistance of rollator.        04/14/2024    3:28 PM 12/19/2023    9:17 AM 07/25/2023    9:26 AM  Depression screen PHQ 2/9  Decreased Interest 1 0 0  Down, Depressed, Hopeless 1 0 0  PHQ - 2 Score 2 0 0  Altered sleeping 1    Tired, decreased energy 3    Change in appetite 3    Feeling bad or failure about yourself  0    Trouble concentrating 0    Moving slowly or fidgety/restless 0    Suicidal thoughts 0    PHQ-9 Score 9    Difficult doing work/chores Very difficult        04/14/2024    3:28 PM 12/19/2023    9:17 AM 05/07/2023    3:05 PM 02/15/2023    1:46 PM  GAD 7 : Generalized Anxiety Score  Nervous, Anxious, on Edge 1 3 0 1  Control/stop  worrying 1 0 0 1  Worry too much - different things 1 0 1 1  Trouble relaxing 1 2 2 1   Restless 0 0 0 0  Easily annoyed or irritable 1 0 1 0  Afraid - awful might happen 1 0 1 0  Total GAD 7 Score 6 5 5 4   Anxiety Difficulty Somewhat difficult  Somewhat difficult Somewhat difficult     No results found for any visits on 04/14/24.    Assessment & Plan:   Edema of left lower extremity -     D-dimer, quantitative -     VAS US  LOWER EXTREMITY VENOUS (DVT); Future  Class 3 severe obesity with serious comorbidity and body mass index (BMI) of 40.0 to 44.9 in adult, unspecified obesity type  Essential hypertension  Dysfunction of left eustachian tube   LLE edema with TTP.  Concern for DVT given sedentary lifestyle.  Obtain D-dimer and Doppler ultrasound.  Further recommendations if needed based on results.  Body mass index is 40 kg/m.  Discussed the importance of increasing physical activity and making changes to diet.  BP elevated.  Recheck.  Possibly related to cessation of prednisone  without taper.  Continue Norvasc  5 mg daily.  Eustachian tube dysfunction left ear likely worsened by allergies.  Patient to restart p.o. antihistamine daily.  Has Allegra  and Xyzal  at home.  Advised to choose one to take daily.  Can also use Flonase  nasal spray.  Continue  follow-up with audiology as needed  Return if symptoms worsen or fail to improve.   Clotilda JONELLE Single, MD

## 2024-04-15 ENCOUNTER — Ambulatory Visit (HOSPITAL_COMMUNITY)
Admission: RE | Admit: 2024-04-15 | Discharge: 2024-04-15 | Disposition: A | Discharge status: Home / Self Care | Source: Ambulatory Visit | Attending: Family Medicine | Admitting: Family Medicine

## 2024-04-15 DIAGNOSIS — R600 Localized edema: Secondary | ICD-10-CM | POA: Diagnosis not present

## 2024-04-15 LAB — D-DIMER, QUANTITATIVE: D-Dimer, Quant: 0.27 ug{FEU}/mL (ref <0.50)

## 2024-04-16 ENCOUNTER — Ambulatory Visit: Payer: Self-pay | Admitting: Family Medicine

## 2024-04-16 DIAGNOSIS — M1990 Unspecified osteoarthritis, unspecified site: Secondary | ICD-10-CM | POA: Diagnosis not present

## 2024-04-16 DIAGNOSIS — M069 Rheumatoid arthritis, unspecified: Secondary | ICD-10-CM | POA: Diagnosis not present

## 2024-04-16 DIAGNOSIS — I10 Essential (primary) hypertension: Secondary | ICD-10-CM | POA: Diagnosis not present

## 2024-04-16 DIAGNOSIS — F419 Anxiety disorder, unspecified: Secondary | ICD-10-CM | POA: Diagnosis not present

## 2024-04-16 DIAGNOSIS — F32A Depression, unspecified: Secondary | ICD-10-CM | POA: Diagnosis not present

## 2024-04-16 DIAGNOSIS — K5730 Diverticulosis of large intestine without perforation or abscess without bleeding: Secondary | ICD-10-CM | POA: Diagnosis not present

## 2024-04-16 DIAGNOSIS — G629 Polyneuropathy, unspecified: Secondary | ICD-10-CM | POA: Diagnosis not present

## 2024-04-16 DIAGNOSIS — M810 Age-related osteoporosis without current pathological fracture: Secondary | ICD-10-CM | POA: Diagnosis not present

## 2024-04-16 DIAGNOSIS — G4733 Obstructive sleep apnea (adult) (pediatric): Secondary | ICD-10-CM | POA: Diagnosis not present

## 2024-04-16 DIAGNOSIS — M50323 Other cervical disc degeneration at C6-C7 level: Secondary | ICD-10-CM | POA: Diagnosis not present

## 2024-04-18 ENCOUNTER — Other Ambulatory Visit: Payer: Self-pay | Admitting: Family Medicine

## 2024-04-18 DIAGNOSIS — J3089 Other allergic rhinitis: Secondary | ICD-10-CM

## 2024-04-21 ENCOUNTER — Other Ambulatory Visit (INDEPENDENT_AMBULATORY_CARE_PROVIDER_SITE_OTHER): Payer: Self-pay | Admitting: Internal Medicine

## 2024-04-21 DIAGNOSIS — R7303 Prediabetes: Secondary | ICD-10-CM

## 2024-04-21 DIAGNOSIS — R635 Abnormal weight gain: Secondary | ICD-10-CM

## 2024-04-22 DIAGNOSIS — M810 Age-related osteoporosis without current pathological fracture: Secondary | ICD-10-CM | POA: Diagnosis not present

## 2024-04-22 DIAGNOSIS — M50323 Other cervical disc degeneration at C6-C7 level: Secondary | ICD-10-CM | POA: Diagnosis not present

## 2024-04-22 DIAGNOSIS — M069 Rheumatoid arthritis, unspecified: Secondary | ICD-10-CM | POA: Diagnosis not present

## 2024-04-22 DIAGNOSIS — K5730 Diverticulosis of large intestine without perforation or abscess without bleeding: Secondary | ICD-10-CM | POA: Diagnosis not present

## 2024-04-22 DIAGNOSIS — I10 Essential (primary) hypertension: Secondary | ICD-10-CM | POA: Diagnosis not present

## 2024-04-22 DIAGNOSIS — F419 Anxiety disorder, unspecified: Secondary | ICD-10-CM | POA: Diagnosis not present

## 2024-04-22 DIAGNOSIS — M1990 Unspecified osteoarthritis, unspecified site: Secondary | ICD-10-CM | POA: Diagnosis not present

## 2024-04-22 DIAGNOSIS — F32A Depression, unspecified: Secondary | ICD-10-CM | POA: Diagnosis not present

## 2024-04-22 DIAGNOSIS — G629 Polyneuropathy, unspecified: Secondary | ICD-10-CM | POA: Diagnosis not present

## 2024-04-23 DIAGNOSIS — F419 Anxiety disorder, unspecified: Secondary | ICD-10-CM | POA: Diagnosis not present

## 2024-04-23 DIAGNOSIS — I10 Essential (primary) hypertension: Secondary | ICD-10-CM | POA: Diagnosis not present

## 2024-04-23 DIAGNOSIS — F32A Depression, unspecified: Secondary | ICD-10-CM | POA: Diagnosis not present

## 2024-04-23 DIAGNOSIS — G629 Polyneuropathy, unspecified: Secondary | ICD-10-CM | POA: Diagnosis not present

## 2024-04-23 DIAGNOSIS — M50323 Other cervical disc degeneration at C6-C7 level: Secondary | ICD-10-CM | POA: Diagnosis not present

## 2024-04-23 DIAGNOSIS — K5730 Diverticulosis of large intestine without perforation or abscess without bleeding: Secondary | ICD-10-CM | POA: Diagnosis not present

## 2024-04-23 DIAGNOSIS — M810 Age-related osteoporosis without current pathological fracture: Secondary | ICD-10-CM | POA: Diagnosis not present

## 2024-04-23 DIAGNOSIS — M069 Rheumatoid arthritis, unspecified: Secondary | ICD-10-CM | POA: Diagnosis not present

## 2024-04-23 DIAGNOSIS — M1990 Unspecified osteoarthritis, unspecified site: Secondary | ICD-10-CM | POA: Diagnosis not present

## 2024-04-28 DIAGNOSIS — K5730 Diverticulosis of large intestine without perforation or abscess without bleeding: Secondary | ICD-10-CM | POA: Diagnosis not present

## 2024-04-28 DIAGNOSIS — M1990 Unspecified osteoarthritis, unspecified site: Secondary | ICD-10-CM | POA: Diagnosis not present

## 2024-04-28 DIAGNOSIS — M069 Rheumatoid arthritis, unspecified: Secondary | ICD-10-CM | POA: Diagnosis not present

## 2024-04-28 DIAGNOSIS — F32A Depression, unspecified: Secondary | ICD-10-CM | POA: Diagnosis not present

## 2024-04-28 DIAGNOSIS — M50323 Other cervical disc degeneration at C6-C7 level: Secondary | ICD-10-CM | POA: Diagnosis not present

## 2024-04-28 DIAGNOSIS — M810 Age-related osteoporosis without current pathological fracture: Secondary | ICD-10-CM | POA: Diagnosis not present

## 2024-04-28 DIAGNOSIS — I10 Essential (primary) hypertension: Secondary | ICD-10-CM | POA: Diagnosis not present

## 2024-04-28 DIAGNOSIS — G629 Polyneuropathy, unspecified: Secondary | ICD-10-CM | POA: Diagnosis not present

## 2024-04-28 DIAGNOSIS — F419 Anxiety disorder, unspecified: Secondary | ICD-10-CM | POA: Diagnosis not present

## 2024-04-30 DIAGNOSIS — G629 Polyneuropathy, unspecified: Secondary | ICD-10-CM | POA: Diagnosis not present

## 2024-04-30 DIAGNOSIS — M50323 Other cervical disc degeneration at C6-C7 level: Secondary | ICD-10-CM | POA: Diagnosis not present

## 2024-04-30 DIAGNOSIS — F32A Depression, unspecified: Secondary | ICD-10-CM | POA: Diagnosis not present

## 2024-04-30 DIAGNOSIS — M810 Age-related osteoporosis without current pathological fracture: Secondary | ICD-10-CM | POA: Diagnosis not present

## 2024-04-30 DIAGNOSIS — M069 Rheumatoid arthritis, unspecified: Secondary | ICD-10-CM | POA: Diagnosis not present

## 2024-04-30 DIAGNOSIS — K5730 Diverticulosis of large intestine without perforation or abscess without bleeding: Secondary | ICD-10-CM | POA: Diagnosis not present

## 2024-04-30 DIAGNOSIS — M1990 Unspecified osteoarthritis, unspecified site: Secondary | ICD-10-CM | POA: Diagnosis not present

## 2024-04-30 DIAGNOSIS — I10 Essential (primary) hypertension: Secondary | ICD-10-CM | POA: Diagnosis not present

## 2024-04-30 DIAGNOSIS — F419 Anxiety disorder, unspecified: Secondary | ICD-10-CM | POA: Diagnosis not present

## 2024-05-08 ENCOUNTER — Telehealth: Payer: Self-pay | Admitting: Neurology

## 2024-05-08 DIAGNOSIS — M50323 Other cervical disc degeneration at C6-C7 level: Secondary | ICD-10-CM | POA: Diagnosis not present

## 2024-05-08 DIAGNOSIS — M1990 Unspecified osteoarthritis, unspecified site: Secondary | ICD-10-CM | POA: Diagnosis not present

## 2024-05-08 DIAGNOSIS — K5730 Diverticulosis of large intestine without perforation or abscess without bleeding: Secondary | ICD-10-CM | POA: Diagnosis not present

## 2024-05-08 DIAGNOSIS — G629 Polyneuropathy, unspecified: Secondary | ICD-10-CM | POA: Diagnosis not present

## 2024-05-08 DIAGNOSIS — F419 Anxiety disorder, unspecified: Secondary | ICD-10-CM | POA: Diagnosis not present

## 2024-05-08 DIAGNOSIS — M069 Rheumatoid arthritis, unspecified: Secondary | ICD-10-CM | POA: Diagnosis not present

## 2024-05-08 DIAGNOSIS — M810 Age-related osteoporosis without current pathological fracture: Secondary | ICD-10-CM | POA: Diagnosis not present

## 2024-05-08 DIAGNOSIS — I10 Essential (primary) hypertension: Secondary | ICD-10-CM | POA: Diagnosis not present

## 2024-05-08 DIAGNOSIS — F32A Depression, unspecified: Secondary | ICD-10-CM | POA: Diagnosis not present

## 2024-05-08 NOTE — Telephone Encounter (Signed)
 Call to  katherine, left voicemail for verbal orders as requested

## 2024-05-08 NOTE — Telephone Encounter (Signed)
 Kathy from [facility] called requesting VO for Extending PT with the frequency of 2x4

## 2024-05-10 DIAGNOSIS — M810 Age-related osteoporosis without current pathological fracture: Secondary | ICD-10-CM | POA: Diagnosis not present

## 2024-05-10 DIAGNOSIS — G4733 Obstructive sleep apnea (adult) (pediatric): Secondary | ICD-10-CM | POA: Diagnosis not present

## 2024-05-10 DIAGNOSIS — M069 Rheumatoid arthritis, unspecified: Secondary | ICD-10-CM | POA: Diagnosis not present

## 2024-05-10 DIAGNOSIS — M1990 Unspecified osteoarthritis, unspecified site: Secondary | ICD-10-CM | POA: Diagnosis not present

## 2024-05-10 DIAGNOSIS — M50323 Other cervical disc degeneration at C6-C7 level: Secondary | ICD-10-CM | POA: Diagnosis not present

## 2024-05-10 DIAGNOSIS — K5730 Diverticulosis of large intestine without perforation or abscess without bleeding: Secondary | ICD-10-CM | POA: Diagnosis not present

## 2024-05-10 DIAGNOSIS — F32A Depression, unspecified: Secondary | ICD-10-CM | POA: Diagnosis not present

## 2024-05-10 DIAGNOSIS — G629 Polyneuropathy, unspecified: Secondary | ICD-10-CM | POA: Diagnosis not present

## 2024-05-10 DIAGNOSIS — F419 Anxiety disorder, unspecified: Secondary | ICD-10-CM | POA: Diagnosis not present

## 2024-05-10 DIAGNOSIS — I10 Essential (primary) hypertension: Secondary | ICD-10-CM | POA: Diagnosis not present

## 2024-05-12 DIAGNOSIS — I10 Essential (primary) hypertension: Secondary | ICD-10-CM | POA: Diagnosis not present

## 2024-05-12 DIAGNOSIS — M069 Rheumatoid arthritis, unspecified: Secondary | ICD-10-CM | POA: Diagnosis not present

## 2024-05-12 DIAGNOSIS — F32A Depression, unspecified: Secondary | ICD-10-CM | POA: Diagnosis not present

## 2024-05-12 DIAGNOSIS — K5730 Diverticulosis of large intestine without perforation or abscess without bleeding: Secondary | ICD-10-CM | POA: Diagnosis not present

## 2024-05-12 DIAGNOSIS — M810 Age-related osteoporosis without current pathological fracture: Secondary | ICD-10-CM | POA: Diagnosis not present

## 2024-05-12 DIAGNOSIS — M1990 Unspecified osteoarthritis, unspecified site: Secondary | ICD-10-CM | POA: Diagnosis not present

## 2024-05-12 DIAGNOSIS — M50323 Other cervical disc degeneration at C6-C7 level: Secondary | ICD-10-CM | POA: Diagnosis not present

## 2024-05-12 DIAGNOSIS — F419 Anxiety disorder, unspecified: Secondary | ICD-10-CM | POA: Diagnosis not present

## 2024-05-12 DIAGNOSIS — G629 Polyneuropathy, unspecified: Secondary | ICD-10-CM | POA: Diagnosis not present

## 2024-05-13 NOTE — Telephone Encounter (Signed)
 Signed [facility] health orders faxed to [facility] as requested

## 2024-05-14 DIAGNOSIS — M069 Rheumatoid arthritis, unspecified: Secondary | ICD-10-CM | POA: Diagnosis not present

## 2024-05-14 DIAGNOSIS — M810 Age-related osteoporosis without current pathological fracture: Secondary | ICD-10-CM | POA: Diagnosis not present

## 2024-05-14 DIAGNOSIS — K5730 Diverticulosis of large intestine without perforation or abscess without bleeding: Secondary | ICD-10-CM | POA: Diagnosis not present

## 2024-05-14 DIAGNOSIS — M50323 Other cervical disc degeneration at C6-C7 level: Secondary | ICD-10-CM | POA: Diagnosis not present

## 2024-05-14 DIAGNOSIS — G629 Polyneuropathy, unspecified: Secondary | ICD-10-CM | POA: Diagnosis not present

## 2024-05-14 DIAGNOSIS — M1990 Unspecified osteoarthritis, unspecified site: Secondary | ICD-10-CM | POA: Diagnosis not present

## 2024-05-14 DIAGNOSIS — F32A Depression, unspecified: Secondary | ICD-10-CM | POA: Diagnosis not present

## 2024-05-14 DIAGNOSIS — I10 Essential (primary) hypertension: Secondary | ICD-10-CM | POA: Diagnosis not present

## 2024-05-14 DIAGNOSIS — F419 Anxiety disorder, unspecified: Secondary | ICD-10-CM | POA: Diagnosis not present

## 2024-05-19 ENCOUNTER — Telehealth: Payer: Self-pay

## 2024-05-19 DIAGNOSIS — I10 Essential (primary) hypertension: Secondary | ICD-10-CM | POA: Diagnosis not present

## 2024-05-19 DIAGNOSIS — F32A Depression, unspecified: Secondary | ICD-10-CM | POA: Diagnosis not present

## 2024-05-19 DIAGNOSIS — M50323 Other cervical disc degeneration at C6-C7 level: Secondary | ICD-10-CM | POA: Diagnosis not present

## 2024-05-19 DIAGNOSIS — M1990 Unspecified osteoarthritis, unspecified site: Secondary | ICD-10-CM | POA: Diagnosis not present

## 2024-05-19 DIAGNOSIS — G629 Polyneuropathy, unspecified: Secondary | ICD-10-CM | POA: Diagnosis not present

## 2024-05-19 DIAGNOSIS — M069 Rheumatoid arthritis, unspecified: Secondary | ICD-10-CM | POA: Diagnosis not present

## 2024-05-19 DIAGNOSIS — F419 Anxiety disorder, unspecified: Secondary | ICD-10-CM | POA: Diagnosis not present

## 2024-05-19 DIAGNOSIS — M810 Age-related osteoporosis without current pathological fracture: Secondary | ICD-10-CM | POA: Diagnosis not present

## 2024-05-19 DIAGNOSIS — K5730 Diverticulosis of large intestine without perforation or abscess without bleeding: Secondary | ICD-10-CM | POA: Diagnosis not present

## 2024-05-19 NOTE — Telephone Encounter (Signed)
 Signed PT visit faxed to [facility]

## 2024-05-21 DIAGNOSIS — G629 Polyneuropathy, unspecified: Secondary | ICD-10-CM | POA: Diagnosis not present

## 2024-05-21 DIAGNOSIS — F32A Depression, unspecified: Secondary | ICD-10-CM | POA: Diagnosis not present

## 2024-05-21 DIAGNOSIS — K5730 Diverticulosis of large intestine without perforation or abscess without bleeding: Secondary | ICD-10-CM | POA: Diagnosis not present

## 2024-05-21 DIAGNOSIS — M810 Age-related osteoporosis without current pathological fracture: Secondary | ICD-10-CM | POA: Diagnosis not present

## 2024-05-21 DIAGNOSIS — M50323 Other cervical disc degeneration at C6-C7 level: Secondary | ICD-10-CM | POA: Diagnosis not present

## 2024-05-21 DIAGNOSIS — I10 Essential (primary) hypertension: Secondary | ICD-10-CM | POA: Diagnosis not present

## 2024-05-21 DIAGNOSIS — M1990 Unspecified osteoarthritis, unspecified site: Secondary | ICD-10-CM | POA: Diagnosis not present

## 2024-05-21 DIAGNOSIS — M069 Rheumatoid arthritis, unspecified: Secondary | ICD-10-CM | POA: Diagnosis not present

## 2024-05-21 DIAGNOSIS — F419 Anxiety disorder, unspecified: Secondary | ICD-10-CM | POA: Diagnosis not present

## 2024-05-26 DIAGNOSIS — K5730 Diverticulosis of large intestine without perforation or abscess without bleeding: Secondary | ICD-10-CM | POA: Diagnosis not present

## 2024-05-26 DIAGNOSIS — M069 Rheumatoid arthritis, unspecified: Secondary | ICD-10-CM | POA: Diagnosis not present

## 2024-05-26 DIAGNOSIS — F419 Anxiety disorder, unspecified: Secondary | ICD-10-CM | POA: Diagnosis not present

## 2024-05-26 DIAGNOSIS — M810 Age-related osteoporosis without current pathological fracture: Secondary | ICD-10-CM | POA: Diagnosis not present

## 2024-05-26 DIAGNOSIS — I10 Essential (primary) hypertension: Secondary | ICD-10-CM | POA: Diagnosis not present

## 2024-05-26 DIAGNOSIS — M1990 Unspecified osteoarthritis, unspecified site: Secondary | ICD-10-CM | POA: Diagnosis not present

## 2024-05-26 DIAGNOSIS — G629 Polyneuropathy, unspecified: Secondary | ICD-10-CM | POA: Diagnosis not present

## 2024-05-26 DIAGNOSIS — M50323 Other cervical disc degeneration at C6-C7 level: Secondary | ICD-10-CM | POA: Diagnosis not present

## 2024-05-26 DIAGNOSIS — F32A Depression, unspecified: Secondary | ICD-10-CM | POA: Diagnosis not present

## 2024-05-28 DIAGNOSIS — I10 Essential (primary) hypertension: Secondary | ICD-10-CM | POA: Diagnosis not present

## 2024-05-28 DIAGNOSIS — F419 Anxiety disorder, unspecified: Secondary | ICD-10-CM | POA: Diagnosis not present

## 2024-05-28 DIAGNOSIS — G629 Polyneuropathy, unspecified: Secondary | ICD-10-CM | POA: Diagnosis not present

## 2024-05-28 DIAGNOSIS — M50323 Other cervical disc degeneration at C6-C7 level: Secondary | ICD-10-CM | POA: Diagnosis not present

## 2024-05-28 DIAGNOSIS — K5730 Diverticulosis of large intestine without perforation or abscess without bleeding: Secondary | ICD-10-CM | POA: Diagnosis not present

## 2024-05-28 DIAGNOSIS — M069 Rheumatoid arthritis, unspecified: Secondary | ICD-10-CM | POA: Diagnosis not present

## 2024-05-28 DIAGNOSIS — M1990 Unspecified osteoarthritis, unspecified site: Secondary | ICD-10-CM | POA: Diagnosis not present

## 2024-05-28 DIAGNOSIS — M810 Age-related osteoporosis without current pathological fracture: Secondary | ICD-10-CM | POA: Diagnosis not present

## 2024-05-28 DIAGNOSIS — F32A Depression, unspecified: Secondary | ICD-10-CM | POA: Diagnosis not present

## 2024-05-29 ENCOUNTER — Other Ambulatory Visit (INDEPENDENT_AMBULATORY_CARE_PROVIDER_SITE_OTHER): Payer: Self-pay | Admitting: Internal Medicine

## 2024-05-29 DIAGNOSIS — R7303 Prediabetes: Secondary | ICD-10-CM

## 2024-05-29 DIAGNOSIS — R635 Abnormal weight gain: Secondary | ICD-10-CM

## 2024-06-04 DIAGNOSIS — K5730 Diverticulosis of large intestine without perforation or abscess without bleeding: Secondary | ICD-10-CM | POA: Diagnosis not present

## 2024-06-04 DIAGNOSIS — M069 Rheumatoid arthritis, unspecified: Secondary | ICD-10-CM | POA: Diagnosis not present

## 2024-06-04 DIAGNOSIS — M810 Age-related osteoporosis without current pathological fracture: Secondary | ICD-10-CM | POA: Diagnosis not present

## 2024-06-04 DIAGNOSIS — F419 Anxiety disorder, unspecified: Secondary | ICD-10-CM | POA: Diagnosis not present

## 2024-06-04 DIAGNOSIS — F32A Depression, unspecified: Secondary | ICD-10-CM | POA: Diagnosis not present

## 2024-06-04 DIAGNOSIS — G629 Polyneuropathy, unspecified: Secondary | ICD-10-CM | POA: Diagnosis not present

## 2024-06-04 DIAGNOSIS — I10 Essential (primary) hypertension: Secondary | ICD-10-CM | POA: Diagnosis not present

## 2024-06-04 DIAGNOSIS — M1990 Unspecified osteoarthritis, unspecified site: Secondary | ICD-10-CM | POA: Diagnosis not present

## 2024-06-04 DIAGNOSIS — M50323 Other cervical disc degeneration at C6-C7 level: Secondary | ICD-10-CM | POA: Diagnosis not present

## 2024-06-06 DIAGNOSIS — F32A Depression, unspecified: Secondary | ICD-10-CM | POA: Diagnosis not present

## 2024-06-06 DIAGNOSIS — M50323 Other cervical disc degeneration at C6-C7 level: Secondary | ICD-10-CM | POA: Diagnosis not present

## 2024-06-06 DIAGNOSIS — K5730 Diverticulosis of large intestine without perforation or abscess without bleeding: Secondary | ICD-10-CM | POA: Diagnosis not present

## 2024-06-06 DIAGNOSIS — G629 Polyneuropathy, unspecified: Secondary | ICD-10-CM | POA: Diagnosis not present

## 2024-06-06 DIAGNOSIS — M1990 Unspecified osteoarthritis, unspecified site: Secondary | ICD-10-CM | POA: Diagnosis not present

## 2024-06-06 DIAGNOSIS — F419 Anxiety disorder, unspecified: Secondary | ICD-10-CM | POA: Diagnosis not present

## 2024-06-06 DIAGNOSIS — M069 Rheumatoid arthritis, unspecified: Secondary | ICD-10-CM | POA: Diagnosis not present

## 2024-06-06 DIAGNOSIS — I10 Essential (primary) hypertension: Secondary | ICD-10-CM | POA: Diagnosis not present

## 2024-06-06 DIAGNOSIS — M810 Age-related osteoporosis without current pathological fracture: Secondary | ICD-10-CM | POA: Diagnosis not present

## 2024-06-10 DIAGNOSIS — G4733 Obstructive sleep apnea (adult) (pediatric): Secondary | ICD-10-CM | POA: Diagnosis not present

## 2024-06-19 ENCOUNTER — Other Ambulatory Visit (INDEPENDENT_AMBULATORY_CARE_PROVIDER_SITE_OTHER): Payer: Self-pay | Admitting: Internal Medicine

## 2024-06-19 DIAGNOSIS — T50905A Adverse effect of unspecified drugs, medicaments and biological substances, initial encounter: Secondary | ICD-10-CM

## 2024-06-19 DIAGNOSIS — R7303 Prediabetes: Secondary | ICD-10-CM

## 2024-06-26 NOTE — Telephone Encounter (Signed)
 Signed order faxed to [facility]

## 2024-06-27 ENCOUNTER — Other Ambulatory Visit (INDEPENDENT_AMBULATORY_CARE_PROVIDER_SITE_OTHER): Payer: Self-pay | Admitting: Internal Medicine

## 2024-06-27 DIAGNOSIS — R7303 Prediabetes: Secondary | ICD-10-CM

## 2024-06-27 DIAGNOSIS — R635 Abnormal weight gain: Secondary | ICD-10-CM

## 2024-07-07 ENCOUNTER — Other Ambulatory Visit (HOSPITAL_BASED_OUTPATIENT_CLINIC_OR_DEPARTMENT_OTHER): Payer: Self-pay

## 2024-07-07 DIAGNOSIS — M2550 Pain in unspecified joint: Secondary | ICD-10-CM | POA: Diagnosis not present

## 2024-07-07 DIAGNOSIS — Z862 Personal history of diseases of the blood and blood-forming organs and certain disorders involving the immune mechanism: Secondary | ICD-10-CM | POA: Diagnosis not present

## 2024-07-07 DIAGNOSIS — M0579 Rheumatoid arthritis with rheumatoid factor of multiple sites without organ or systems involvement: Secondary | ICD-10-CM | POA: Diagnosis not present

## 2024-07-07 DIAGNOSIS — E559 Vitamin D deficiency, unspecified: Secondary | ICD-10-CM | POA: Diagnosis not present

## 2024-07-07 DIAGNOSIS — M1990 Unspecified osteoarthritis, unspecified site: Secondary | ICD-10-CM | POA: Diagnosis not present

## 2024-07-07 DIAGNOSIS — M810 Age-related osteoporosis without current pathological fracture: Secondary | ICD-10-CM | POA: Diagnosis not present

## 2024-07-07 DIAGNOSIS — Z79899 Other long term (current) drug therapy: Secondary | ICD-10-CM | POA: Diagnosis not present

## 2024-07-07 MED ORDER — COMIRNATY 30 MCG/0.3ML IM SUSY
0.3000 mL | PREFILLED_SYRINGE | Freq: Once | INTRAMUSCULAR | 0 refills | Status: AC
  Filled 2024-07-07: qty 0.3, 1d supply, fill #0

## 2024-07-07 MED ORDER — FLUZONE HIGH-DOSE 0.5 ML IM SUSY
0.5000 mL | PREFILLED_SYRINGE | Freq: Once | INTRAMUSCULAR | 0 refills | Status: AC
  Filled 2024-07-07: qty 0.5, 1d supply, fill #0

## 2024-07-09 ENCOUNTER — Telehealth: Payer: Self-pay

## 2024-07-09 NOTE — Patient Instructions (Signed)
Please continue using your CPAP regularly. While your insurance requires that you use CPAP at least 4 hours each night on 70% of the nights, I recommend, that you not skip any nights and use it throughout the night if you can. Getting used to CPAP and staying with the treatment long term does take time and patience and discipline. Untreated obstructive sleep apnea when it is moderate to severe can have an adverse impact on cardiovascular health and raise her risk for heart disease, arrhythmias, hypertension, congestive heart failure, stroke and diabetes. Untreated obstructive sleep apnea causes sleep disruption, nonrestorative sleep, and sleep deprivation. This can have an impact on your day to day functioning and cause daytime sleepiness and impairment of cognitive function, memory loss, mood disturbance, and problems focussing. Using CPAP regularly can improve these symptoms.  We will update supply orders, today.   Follow up in

## 2024-07-09 NOTE — Telephone Encounter (Signed)
 Called and spoke to pt who stated that she hasn't been using her machine lately but would like to go back to using machine. Pt would like to keep appt to reestablish for continued use of cpap

## 2024-07-09 NOTE — Progress Notes (Unsigned)
 PATIENT: [name] DOB: 26-Sep-1951  REASON FOR VISIT: follow up HISTORY FROM: patient  Virtual Visit via MyChart video  I connected with [name] on 07/10/24 at  9:30 AM EDT via MyChart video and verified that I am speaking with the correct person using two identifiers.   I discussed the limitations, risks, security and privacy concerns of performing an evaluation and management service by Mychart video and the availability of in person appointments. I also discussed with the patient that there may be a patient responsible charge related to this service. The patient expressed understanding and agreed to proceed.   History of Present Illness:  Dr Onita: neck pain Dr Dohmeier: sleep  07/10/24 ALL (Mychart): [name] returns for follow up for OSA on CPAP. She was last seen by me 01/2024 and was having a difficulty time adjusting to therapy. Since, she has not used therapy. She was having difficulty with neck pain and a skin lesion under her nose. She completed PT for neck pain and underwent trigger point injections with Dr Onita. The skin lesion continues to heal. She wishes to resume therapy.   01/07/2024 ALL (Mychart): [name] is a 73 y.o. female here today for follow up. She was seen in consult with Dr Onita 08/2023 for neck pain thought to be musculoskeletal in nature. Trigger point injection given and advised to follow up as needed. She saw Dr Chalice 08/2023 for concerns of sleep apnea. HST 09/2023 showed very severe snoring, hypoxemia, and moderate severe obstructive sleep apnea. By AASM criteria the AHI is 28.9/h and by CMS criteria 17.2/h both qualify for moderate severe sleep apnea. There was no central apnea noted. AutoPAP advised. Since, she reports having difficulty adjusting to therapy. She did well for a few weeks after first starting therapy but tells me that she was unable to continue therapy as she was fighting with the tubing. She requested an adapter to help  with movement of tubing. Adapter was not delivered in timely manner. She did receive it but now is waiting for a new mask. She is willing to continue therapy. She continues to have waxing and waning neck pain. She is more concerned with new onset swelling of ankles. On amlodipine .     History (copied from Dr Dohmeier's previous note)  Srinidhi Landers Boutwell is a 73 y.o. female patient who is seen upon referral from Dr Francyne on 08/13/2023  for a sleep medicine consultation..  Chief concern according to patient :   I have neck pain ( Dr Onita )  and rheumatoid arthritis, weight issues. Has lost 20 pounds and is now at BMI 41. Patient has symptoms of sleep disordered breathing and high risk phenotype. Counseled on risks associated with undiagnosed / untreated sleep disorders.  Losing 15% of BW may improve symptoms.  She is a snorer, and her late husband commented on that. She lost him 9 years ago.  I have the pleasure of seeing [name] on 08/13/23 a right -handed AA female with a possible sleep disorder.     Sleep relevant medical history: Nocturia at 2 and 4 AM , no ENT surgeries.    Family medical /sleep history: no other family member on CPAP with OSA, nobody with insomnia, no sleep walkers.    Social history: grew up in a [place]  2 siblings, still working as a Clinical research associate, widowed since 2015.  Patient is working as an Pensions consultant  and lives in a household alone.  No pets.  Childless.  The patient currently works part time, reduced client list  Tobacco use; none .  ETOH use ; yes, socially -2/ week.  Caffeine intake in form of Coffee( 24 ounces) Soda( /) Tea ( yes, home made) , no energy drinks. Regular Exercise; walking with walker currently.    Sleep habits are as follows: The patient's dinner time is variable , sometimes skipped. The patient goes to bed at 11 PM - 1 AM, and asleep promptly- she works frequently at 1 AM- 3 AM, continues to sleep for 6 hours, fragmented , wakes for 2 bathroom breaks,  the first time at 2 AM.   The preferred sleep position is lateral , with the support of 2 pillows. Neck support. Dreams are reportedly rare.  The patient wakes up spontaneously at 6.30-7 without an alarm. She stays in bed after waking up, rises at 8.30 AM . She reports not feeling refreshed or restored in AM, with symptoms such as  residual fatigue.  Naps are taken infrequently, lasting from 1-3 hours , after lunch-  are more refreshing than nocturnal sleep.    Observations/Objective:  Generalized: Well developed, in no acute distress  Mentation: Alert oriented to time, place, history taking. Follows all commands speech and language fluent   Assessment and Plan:  73 y.o. year old female  has a past medical history of Acute bronchitis, Allergy, Anemia, Anxiety, Arthritis, Asthma, Back pain, Backache, unspecified, Cataract, Depression, Diverticulitis, Diverticulosis of colon (without mention of hemorrhage), Edema, GERD (gastroesophageal reflux disease), Glaucoma, Joint pain, Lactose intolerance, Lumbago, Neuropathy, Obesity, Osteoarthritis, Other chest pain, Pain in joint, ankle and foot, Rheumatoid arthritis (HCC), Sarcoidosis, SOB (shortness of breath), Tibialis tendinitis, and Unspecified asthma(493.90). here with    ICD-10-CM   1. OSA on CPAP  G47.33       [name] reports having difficulty using therapy due to neck pain and a skin lesion under nose. I have encouraged her to contact DME if she wishes to try a different mask. No recent data to review. She was encouraged to use CPAP nightly for at least 4 hours. She will continue close follow up with PCP and rheumatology for neck pain and skin lesion. Unable to visualize on virtual visit. She will follow up with me in 4 months for compliance review. She verbalizes understanding and agreement with this plan.   No orders of the defined types were placed in this encounter.   No orders of the defined types were placed in this  encounter.    Follow Up Instructions:  I discussed the assessment and treatment plan with the patient. The patient was provided an opportunity to ask questions and all were answered. The patient agreed with the plan and demonstrated an understanding of the instructions.   The patient was advised to call back or seek an in-person evaluation if the symptoms worsen or if the condition fails to improve as anticipated.  I provided 30 minutes of face-to-face and non face-to-face time during this MyChart video encounter. Patient located at their place of residence. Provider is in the office.    Ranessa Kosta, NP

## 2024-07-10 ENCOUNTER — Telehealth: Admitting: Family Medicine

## 2024-07-10 ENCOUNTER — Encounter: Payer: Self-pay | Admitting: Family Medicine

## 2024-07-10 DIAGNOSIS — G4733 Obstructive sleep apnea (adult) (pediatric): Secondary | ICD-10-CM

## 2024-07-14 ENCOUNTER — Other Ambulatory Visit (INDEPENDENT_AMBULATORY_CARE_PROVIDER_SITE_OTHER): Payer: Self-pay | Admitting: Internal Medicine

## 2024-07-14 DIAGNOSIS — R635 Abnormal weight gain: Secondary | ICD-10-CM

## 2024-07-14 DIAGNOSIS — R7303 Prediabetes: Secondary | ICD-10-CM

## 2024-07-30 ENCOUNTER — Ambulatory Visit: Payer: Medicare PPO

## 2024-07-30 ENCOUNTER — Other Ambulatory Visit: Payer: Self-pay

## 2024-07-30 VITALS — Ht 60.0 in | Wt 204.0 lb

## 2024-07-30 DIAGNOSIS — Z0000 Encounter for general adult medical examination without abnormal findings: Secondary | ICD-10-CM | POA: Diagnosis not present

## 2024-07-30 DIAGNOSIS — J4530 Mild persistent asthma, uncomplicated: Secondary | ICD-10-CM

## 2024-07-30 MED ORDER — ALBUTEROL SULFATE HFA 108 (90 BASE) MCG/ACT IN AERS: INHALATION_SPRAY | RESPIRATORY_TRACT | 3 refills | Status: AC

## 2024-07-30 NOTE — Patient Instructions (Signed)
 [name],  Thank you for taking the time for your Medicare Wellness Visit. I appreciate your continued commitment to your health goals. Please review the care plan we discussed, and feel free to reach out if I can assist you further.  Medicare recommends these wellness visits once per year to help you and your care team stay ahead of potential health issues. These visits are designed to focus on prevention, allowing your provider to concentrate on managing your acute and chronic conditions during your regular appointments.  Please note that Annual Wellness Visits do not include a physical exam. Some assessments may be limited, especially if the visit was conducted virtually. If needed, we may recommend a separate in-person follow-up with your provider.  Ongoing Care Seeing your primary care provider every 3 to 6 months helps us  monitor your health and provide consistent, personalized care.   Referrals If a referral was made during today's visit and you haven't received any updates within two weeks, please contact the referred provider directly to check on the status.  Recommended Screenings:  Health Maintenance  Topic Date Due   COVID-19 Vaccine (8 - Pfizer risk 2025-26 season) 01/05/2025   Medicare Annual Wellness Visit  07/30/2025   DTaP/Tdap/Td vaccine (2 - Td or Tdap) 09/11/2026   Colon Cancer Screening  10/02/2029   Pneumococcal Vaccine for age over 3  Completed   Flu Shot  Completed   DEXA scan (bone density measurement)  Completed   Hepatitis C Screening  Completed   Zoster (Shingles) Vaccine  Completed   Meningitis B Vaccine  Aged Out   Breast Cancer Screening  Discontinued       07/30/2024   11:50 AM  Advanced Directives  Does Patient Have a Medical Advance Directive? No  Would patient like information on creating a medical advance directive? Yes (MAU/Ambulatory/Procedural Areas - Information given)   Advance Care Planning is important because it: Ensures you  receive medical care that aligns with your values, goals, and preferences. Provides guidance to your family and loved ones, reducing the emotional burden of decision-making during critical moments.  Information on Advanced Care Planning can be found at [place]  Secret[employer] of [facility] Advance Health Care Directives Advance Health Care Directives ([email or web address]   Vision: Annual vision screenings are recommended for early detection of glaucoma, cataracts, and diabetic retinopathy. These exams can also reveal signs of chronic conditions such as diabetes and high blood pressure.  Dental: Annual dental screenings help detect early signs of oral cancer, gum disease, and other conditions linked to overall health, including heart disease and diabetes.  Please see the attached documents for additional preventive care recommendations.

## 2024-07-30 NOTE — Progress Notes (Signed)
 Subjective:   [name] is a 73 y.o. who presents for a Medicare Wellness preventive visit.  As a reminder, Annual Wellness Visits don't include a physical exam, and some assessments may be limited, especially if this visit is performed virtually. We may recommend an in-person follow-up visit with your provider if needed.  Visit Complete: Virtual I connected with  [name] on 07/30/24 by a video and audio enabled telemedicine application and verified that I am speaking with the correct person using two identifiers.  Patient Location: Home  Provider Location: Home Office  I discussed the limitations of evaluation and management by telemedicine. The patient expressed understanding and agreed to proceed.  Vital Signs: Because this visit was a virtual/telehealth visit, some criteria may be missing or patient reported. Any vitals not documented were not able to be obtained and vitals that have been documented are patient reported.  Persons Participating in Visit: Patient.  AWV Questionnaire: Yes: Patient Medicare AWV questionnaire was completed by the patient on 07/26/24; I have confirmed that all information answered by patient is correct and no changes since this date.  Cardiac Risk Factors include: advanced age (>63men, >84 women);sedentary lifestyle;obesity (BMI >30kg/m2)     Objective:    Today's Vitals   07/30/24 1024  Weight: 204 lb (92.5 kg)  Height: 5' (1.524 m)   Body mass index is 39.84 kg/m.     07/30/2024   11:50 AM 07/25/2023    9:39 AM 05/03/2023   12:29 PM 11/29/2022   11:58 AM 11/08/2022   11:18 PM 07/20/2022    3:09 PM 04/14/2022   12:13 AM  Advanced Directives  Does Patient Have a Medical Advance Directive? No Yes No Yes No Yes Yes  Type of Furniture Conservator/restorer;Living will  Healthcare Power of [place];Living will  Healthcare Power of [place];Living will Out of facility DNR (pink MOST or yellow form);Living  will;Healthcare Power of Attorney  Does patient want to make changes to medical advance directive?    No - Patient declined   No - Patient declined  Copy of Healthcare Power of Attorney in Chart?  No - copy requested  No - copy requested  No - copy requested No - copy requested  Would patient like information on creating a medical advance directive? Yes (MAU/Ambulatory/Procedural Areas - Information given)   No - Patient declined No - Patient declined      Current Medications (verified) Outpatient Encounter Medications as of 07/30/2024  Medication Sig   acetaminophen  (TYLENOL ) 500 MG tablet Take 1,000 mg by mouth in the morning and at bedtime.   albuterol  (VENTOLIN  HFA) 108 (90 Base) MCG/ACT inhaler TAKE 2 PUFFS BY MOUTH EVERY 6 HOURS AS NEEDED (Patient taking differently: Inhale 2 puffs into the lungs every 6 (six) hours as needed for wheezing or shortness of breath. TAKE 2 PUFFS BY MOUTH EVERY 6 HOURS AS NEEDED)   alendronate  (FOSAMAX ) 70 MG tablet Take 70 mg by mouth every Monday.   amLODipine  (NORVASC ) 5 MG tablet Take 1 tablet (5 mg total) by mouth daily.   amoxicillin  (AMOXIL ) 500 MG capsule Take 2,000 mg by mouth See admin instructions. Take 2000mg  by mouth 1 hour before appt.   aspirin  EC 325 MG tablet Take 325 mg by mouth daily.   Blood Pressure Monitor MISC 1 Device by Does not apply route daily.   celecoxib  (CELEBREX ) 50 MG capsule Take 1 capsule (50 mg total) by mouth 2 (two) times daily as  needed for pain.   Cholecalciferol (VITAMIN D3) 50 MCG (2000 UT) capsule    cyanocobalamin  (VITAMIN B12) 1000 MCG/ML injection Inject 1,000 mcg into the muscle every 30 (thirty) days.   cycloSPORINE  (RESTASIS ) 0.05 % ophthalmic emulsion Place 1 drop into both eyes 2 (two) times daily.   DULoxetine  (CYMBALTA ) 30 MG capsule Take 1 capsule (30 mg total) by mouth daily.   fexofenadine  (ALLEGRA ) 180 MG tablet Take 1 tablet (180 mg total) by mouth daily.   fluticasone  (FLONASE ) 50 MCG/ACT nasal spray  Place 1 spray into both nostrils daily.   fluticasone -salmeterol (ADVAIR) 250-50 MCG/ACT AEPB INHALE ONE PUFF INTO THE LUNGS TWICE DAILY   folic acid  (FOLVITE ) 1 MG tablet TAKE ONE TABLET BY MOUTH EVERY DAY   gabapentin  (NEURONTIN ) 300 MG capsule Take 1 capsule (300 mg total) by mouth 2 (two) times daily.   IYUZEH 0.005 % SOLN SMARTSIG:1 Drop(s) In Eye(s) Every Evening   KEVZARA 200 MG/1. SOAJ Inject into the skin.   Lactase (LACTAID FAST ACT) 9000 units CHEW Chew by mouth. Dosage unknown   leflunomide (ARAVA) 20 MG tablet Take 20 mg by mouth daily.   LUMIGAN 0.01 % SOLN SMARTSIG:1 Drop(s) In Eye(s) Every Evening   metFORMIN  (GLUCOPHAGE -XR) 500 MG 24 hr tablet Take 1 tablet (500 mg total) by mouth daily with breakfast.   Multiple Vitamins-Minerals (MULTIVITAMIN) tablet Take 1 tablet by mouth daily.   pantoprazole  (PROTONIX ) 40 MG tablet Take 1 tablet (40 mg total) by mouth daily.   polyethylene glycol powder (GLYCOLAX /MIRALAX ) 17 GM/SCOOP powder Take 17 g by mouth daily as needed.   predniSONE  (DELTASONE ) 5 MG tablet Take 1 tablet (5 mg total) by mouth daily.   RHOPRESSA 0.02 % SOLN Apply 1 drop to eye at bedtime.   senna-docusate (SENOKOT-S) 8.6-50 MG tablet Take 1 tablet by mouth daily.   Facility-Administered Encounter Medications as of 07/30/2024  Medication   cyanocobalamin  (VITAMIN B12) injection 1,000 mcg    Allergies (verified) Invanz  [ertapenem ], Ibuprofen, and Sulfa antibiotics   History: Past Medical History:  Diagnosis Date   Acute bronchitis    Allergy    Anemia    Anxiety    Arthritis    knees   Asthma    Back pain    Backache, unspecified    Cataract    removed both eyes   Depression    Diverticulitis    Diverticulosis of colon (without mention of hemorrhage)    Edema    GERD (gastroesophageal reflux disease)    Glaucoma    Joint pain    Lactose intolerance    Lumbago    Neuropathy    Obesity    Osteoarthritis    Other chest pain    Pain in  joint, ankle and foot    Rheumatoid arthritis (HCC)    Sarcoidosis    SOB (shortness of breath)    Tibialis tendinitis    Unspecified asthma(493.90)    Past Surgical History:  Procedure Laterality Date   ABDOMINAL HYSTERECTOMY  Partial; ovaries remain   CATARACT EXTRACTION W/PHACO Right 06/24/2014   Procedure: CATARACT EXTRACTION PHACO AND INTRAOCULAR LENS PLACEMENT (IOC) RIGHT EYE WITH GONIOSYNECHIALYSIS;  Surgeon: Gaither Quan, MD;  Location: [facility] OR;  Service: Ophthalmology;  Laterality: Right;   COLONOSCOPY     EYE SURGERY Left    cataract surgery   HAND SURGERY Right    trigger finger release   IR RADIOLOGIST EVAL & MGMT  11/14/2018   KNEE ARTHROSCOPY Right    TOTAL  KNEE ARTHROPLASTY Right 12/08/2021   Procedure: RIGHT TOTAL KNEE ARTHROPLASTY APPLICATION OF WOUND VAC;  Surgeon: Addie Cordella Hamilton, MD;  Location: [facility] OR;  Service: Orthopedics;  Laterality: Right;   TOTAL KNEE ARTHROPLASTY Left 04/13/2022   Procedure: LEFT TOTAL KNEE ARTHROPLASTY;  Surgeon: Addie Cordella Hamilton, MD;  Location: [facility] OR;  Service: Orthopedics;  Laterality: Left;   Family History  Problem Relation Age of Onset   Hypertension Mother    Stroke Mother    Depression Mother    Anxiety disorder Mother    Obesity Mother    Arthritis Mother    Vision loss Mother    Varicose Veins Mother    Diabetes Father    Vision loss Father    Sarcoidosis Sister    Cancer Maternal Aunt    Colon cancer Neg Hx    Colon polyps Neg Hx    Esophageal cancer Neg Hx    Rectal cancer Neg Hx    Stomach cancer Neg Hx    Social History   Socioeconomic History   Marital status: Widowed    Spouse name: Not on file   Number of children: 0   Years of education: Not on file   Highest education level: Professional school degree (e.g., MD, DDS, DVM, JD)  Occupational History   Occupation: Pensions Consultant (semi-retired)  Tobacco Use   Smoking status: Never   Smokeless tobacco: Never  Vaping Use   Vaping status: Never Used   Substance and Sexual Activity   Alcohol use: Yes    Comment: 1 glass of wine or mixed drink once a week   Drug use: No   Sexual activity: Not on file  Other Topics Concern   Not on file  Social History Narrative   Not on file   Social Drivers of Health   Financial Resource Strain: Low Risk  (07/26/2024)   Overall Financial Resource Strain (CARDIA)    Difficulty of Paying Living Expenses: Not very hard  Food Insecurity: No Food Insecurity (07/26/2024)   Hunger Vital Sign    Worried About Running Out of Food in the Last Year: Never true    Ran Out of Food in the Last Year: Never true  Transportation Needs: Unmet Transportation Needs (07/26/2024)   PRAPARE - Transportation    Lack of Transportation (Medical): Yes    Lack of Transportation (Non-Medical): Yes  Physical Activity: Insufficiently Active (07/26/2024)   Exercise Vital Sign    Days of Exercise per Week: 3 days    Minutes of Exercise per Session: 20 min  Stress: Stress Concern Present (07/26/2024)   [employer] of Occupational Health - Occupational Stress Questionnaire    Feeling of Stress: To some extent  Social Connections: Moderately Integrated (07/26/2024)   Social Connection and Isolation Panel    Frequency of Communication with Friends and Family: More than three times a week    Frequency of Social Gatherings with Friends and Family: Three times a week    Attends Religious Services: More than 4 times per year    Active Member of Clubs or Organizations: Yes    Attends Banker Meetings: More than 4 times per year    Marital Status: Widowed    Tobacco Counseling Counseling given: Not Answered    Clinical Intake:  Pre-visit preparation completed: Yes  Pain : No/denies pain     Diabetes: No  Lab Results  Component Value Date   HGBA1C 5.9 (H) 05/22/2023   HGBA1C 5.4 06/26/2022   HGBA1C 5.6  09/01/2020     How often do you need to have someone help you when you read instructions,  pamphlets, or other written materials from your doctor or pharmacy?: 1 - Never  Interpreter Needed?: No  Information entered by :: Charmaine Bloodgood LPN   Activities of Daily Living     07/26/2024    3:19 PM  In your present state of health, do you have any difficulty performing the following activities:  Hearing? 1  Vision? 0  Difficulty concentrating or making decisions? 1  Walking or climbing stairs? 1  Dressing or bathing? 0  Doing errands, shopping? 0  Preparing Food and eating ? N  Using the Toilet? N  In the past six months, have you accidently leaked urine? Y  Do you have problems with loss of bowel control? Y  Managing your Medications? N  Managing your Finances? N  Housekeeping or managing your Housekeeping? Y    Patient Care Team: Mercer Clotilda SAUNDERS, MD as PCP - General (Family Medicine) Onita Duos, MD as Consulting Physician (Neurology) Cyrus Carwin, MD as Consulting Physician (Ophthalmology) Aryal, Govinda, MD (Rheumatology) Tory Clotilda, AUD (Audiology)  I have updated your Care Teams any recent Medical Services you may have received from other providers in the past year.     Assessment:   This is a routine wellness examination for [name].  Hearing/Vision screen Hearing Screening - Comments:: Hearing impaired; followed by audiology and wears right hearing aid  Vision Screening - Comments:: Wears rx glasses - up to date with routine eye exams with Dr. Cyrus    Goals Addressed               This Visit's Progress     Patient Stated (pt-stated)   On track     Move from current facility to an independent facility.       Depression Screen     07/30/2024   11:47 AM 04/14/2024    3:28 PM 12/19/2023    9:17 AM 07/25/2023    9:26 AM 07/06/2023    3:03 PM 05/07/2023    3:05 PM 02/15/2023    1:46 PM  PHQ 2/9 Scores  PHQ - 2 Score 2 2 0 0 1 1 2   PHQ- 9 Score 9 9    7 7     Fall Risk     07/26/2024    3:19 PM 04/14/2024    3:27 PM 12/19/2023     9:17 AM 07/25/2023    9:46 AM 07/24/2023   11:02 PM  Fall Risk   Falls in the past year? 1 1 0 0 0  Number falls in past yr: 0 0 0 0 0  Injury with Fall? 0 0 0 0 0  Risk for fall due to : Impaired mobility;History of fall(s);Impaired balance/gait  No Fall Risks No Fall Risks No Fall Risks  Follow up Falls evaluation completed;Education provided;Falls prevention discussed Falls evaluation completed Falls evaluation completed Falls prevention discussed Falls prevention discussed    MEDICARE RISK AT HOME:  Medicare Risk at Home Any stairs in or around the home?: (Patient-Rptd) Yes If so, are there any without handrails?: (Patient-Rptd) No Home free of loose throw rugs in walkways, pet beds, electrical cords, etc?: (Patient-Rptd) No Adequate lighting in your home to reduce risk of falls?: (Patient-Rptd) Yes Life alert?: (Patient-Rptd) No Use of a cane, walker or w/c?: (Patient-Rptd) Yes Grab bars in the bathroom?: (Patient-Rptd) Yes Shower chair or bench in shower?: (Patient-Rptd) Yes Elevated toilet seat or  a handicapped toilet?: (Patient-Rptd) Yes  TIMED UP AND GO:  Was the test performed?  No  Cognitive Function: 6CIT completed      08/28/2021    1:00 PM  Montreal Cognitive Assessment   Visuospatial/ Executive (0/5) 4  Naming (0/3) 3  Attention: Read list of digits (0/2) 2  Attention: Read list of letters (0/1) 1  Attention: Serial 7 subtraction starting at 100 (0/3) 3  Language: Repeat phrase (0/2) 2  Language : Fluency (0/1) 1  Abstraction (0/2) 2  Delayed Recall (0/5) 4  Orientation (0/6) 6  Total 28  Adjusted Score (based on education) 28      07/30/2024   11:50 AM 07/25/2023    9:40 AM 07/20/2022    3:09 PM 01/19/2021    8:23 AM  6CIT Screen  What Year? 0 points 0 points 0 points 0 points  What month? 0 points 0 points 0 points 0 points  What time? 0 points 0 points 0 points   Count back from 20 0 points 0 points 0 points 0 points  Months in reverse 0  points 0 points 0 points 0 points  Repeat phrase 0 points 0 points 0 points 0 points  Total Score 0 points 0 points 0 points     Immunizations Immunization History  Administered Date(s) Administered    sv, Bivalent, Protein Subunit Rsvpref,pf (Abrysvo) 07/27/2022   Fluad Quad(high Dose 65+) 05/26/2021, 06/26/2022   Fluad Trivalent(High Dose 65+) 07/06/2023   INFLUENZA, HIGH DOSE SEASONAL PF 07/03/2017, 06/05/2018, 07/25/2019, 05/17/2020, 07/07/2024   Influenza Split 07/17/2011, 06/24/2012   Influenza Whole 07/11/2007, 07/22/2008, 07/14/2009, 06/29/2010   Influenza,inj,Quad PF,6+ Mos 06/19/2013, 06/12/2014, 06/14/2015, 07/25/2016   Influenza-Unspecified 07/01/2015, 07/02/2016, 05/31/2020   PFIZER Comirnaty (Gray Top)Covid-19 Tri-Sucrose Vaccine 12/31/2020   PFIZER(Purple Top)SARS-COV-2 Vaccination 10/21/2019, 11/10/2019, 07/03/2020   Pfizer(Comirnaty )Fall Seasonal Vaccine 12 years and older 07/06/2023, 07/07/2024   Pneumococcal Conjugate-13 09/11/2016   Pneumococcal Polysaccharide-23 05/31/2020   Tdap 09/11/2016   Zoster Recombinant(Shingrix) 07/25/2019, 04/12/2020   Zoster, Live 06/23/2015    Screening Tests Health Maintenance  Topic Date Due   COVID-19 Vaccine (8 - Pfizer risk 2025-26 season) 01/05/2025   Medicare Annual Wellness (AWV)  07/30/2025   DTaP/Tdap/Td (2 - Td or Tdap) 09/11/2026   Colonoscopy  10/02/2029   Pneumococcal Vaccine: 50+ Years  Completed   Influenza Vaccine  Completed   DEXA SCAN  Completed   Hepatitis C Screening  Completed   Zoster Vaccines- Shingrix  Completed   Meningococcal B Vaccine  Aged Out   Mammogram  Discontinued    Health Maintenance Items Addressed: Obtaining records from last dexa   Additional Screening:  Vision Screening: Recommended annual ophthalmology exams for early detection of glaucoma and other disorders of the eye. Is the patient up to date with their annual eye exam?  Yes  Who is the provider or what is the name of the  office in which the patient attends annual eye exams? Dr. Cyrus   Dental Screening: Recommended annual dental exams for proper oral hygiene  Community Resource Referral / Chronic Care Management: CRR required this visit?  No   CCM required this visit?  No   Plan:    I have personally reviewed and noted the following in the patient's chart:   Medical and social history Use of alcohol, tobacco or illicit drugs  Current medications and supplements including opioid prescriptions. Patient is not currently taking opioid prescriptions. Functional ability and status Nutritional status Physical activity Advanced directives List of  other physicians Hospitalizations, surgeries, and ER visits in previous 12 months Vitals Screenings to include cognitive, depression, and falls Referrals and appointments  In addition, I have reviewed and discussed with patient certain preventive protocols, quality metrics, and best practice recommendations. A written personalized care plan for preventive services as well as general preventive health recommendations were provided to patient.   Lavelle Pfeiffer [place], [place]   [id number]   After Visit Summary: (MyChart) Due to this being a telephonic visit, the after visit summary with patients personalized plan was offered to patient via MyChart   Notes: See telephone note

## 2024-07-30 NOTE — Telephone Encounter (Signed)
 Patient seen for AWV and is requesting refill of Albuterol  inhaler.

## 2024-08-04 ENCOUNTER — Encounter: Payer: Self-pay | Admitting: Radiology

## 2024-10-09 ENCOUNTER — Other Ambulatory Visit: Payer: Self-pay | Admitting: Family Medicine

## 2024-10-09 DIAGNOSIS — J4541 Moderate persistent asthma with (acute) exacerbation: Secondary | ICD-10-CM

## 2024-11-04 NOTE — Patient Instructions (Incomplete)

## 2024-11-06 ENCOUNTER — Telehealth: Payer: Self-pay | Admitting: Family Medicine

## 2024-11-06 NOTE — Telephone Encounter (Signed)
 patient having transportation issues want to if can change OV to MyChart Video Visit. If not would you need to reschedule.  Can I change to a MyChart Visit, she said she do not have any neurological or medication issues.

## 2024-11-06 NOTE — Telephone Encounter (Signed)
 Called pt to confirm appt, pt states she cannot make it in person but is having no issues this should be a short follow up on cpap Switched visit to mychart. Please let me know if this is an issue and I will call pt to reschedule.

## 2024-11-07 ENCOUNTER — Telehealth: Payer: Self-pay | Admitting: *Deleted

## 2024-11-07 NOTE — Telephone Encounter (Signed)
 Left message on patient voicemail asking is she is still using cpap machine, was not able to get data online for 30 days or 90 days.

## 2024-11-10 ENCOUNTER — Telehealth: Admitting: Family Medicine

## 2024-11-10 DIAGNOSIS — G4733 Obstructive sleep apnea (adult) (pediatric): Secondary | ICD-10-CM

## 2025-08-05 ENCOUNTER — Ambulatory Visit
